# Patient Record
Sex: Female | Born: 1935 | Race: White | Hispanic: No | Smoking: Never smoker
Health system: Southern US, Community
[De-identification: ages and names within clinical notes are randomized; demographics above are authoritative.]

## PROBLEM LIST (undated history)

## (undated) DIAGNOSIS — M1990 Unspecified osteoarthritis, unspecified site: Secondary | ICD-10-CM

## (undated) DIAGNOSIS — K219 Gastro-esophageal reflux disease without esophagitis: Secondary | ICD-10-CM

## (undated) DIAGNOSIS — J00 Acute nasopharyngitis [common cold]: Secondary | ICD-10-CM

## (undated) DIAGNOSIS — J189 Pneumonia, unspecified organism: Secondary | ICD-10-CM

## (undated) DIAGNOSIS — C801 Malignant (primary) neoplasm, unspecified: Secondary | ICD-10-CM

## (undated) DIAGNOSIS — R29818 Other symptoms and signs involving the nervous system: Secondary | ICD-10-CM

## (undated) DIAGNOSIS — R0602 Shortness of breath: Secondary | ICD-10-CM

## (undated) DIAGNOSIS — J471 Bronchiectasis with (acute) exacerbation: Secondary | ICD-10-CM

## (undated) DIAGNOSIS — J449 Chronic obstructive pulmonary disease, unspecified: Secondary | ICD-10-CM

## (undated) DIAGNOSIS — I10 Essential (primary) hypertension: Secondary | ICD-10-CM

## (undated) DIAGNOSIS — R059 Cough, unspecified: Secondary | ICD-10-CM

## (undated) DIAGNOSIS — G4700 Insomnia, unspecified: Secondary | ICD-10-CM

## (undated) DIAGNOSIS — R05 Cough: Secondary | ICD-10-CM

## (undated) DIAGNOSIS — Z923 Personal history of irradiation: Secondary | ICD-10-CM

## (undated) DIAGNOSIS — R42 Dizziness and giddiness: Secondary | ICD-10-CM

## (undated) HISTORY — PX: VARICOSE VEIN SURGERY: SHX832

## (undated) HISTORY — DX: Gastro-esophageal reflux disease without esophagitis: K21.9

## (undated) HISTORY — PX: CATARACT EXTRACTION, BILATERAL: SHX1313

## (undated) HISTORY — PX: ANKLE FRACTURE SURGERY: SHX122

## (undated) HISTORY — DX: Cough: R05

## (undated) HISTORY — DX: Other symptoms and signs involving the nervous system: R29.818

## (undated) HISTORY — DX: Acute nasopharyngitis (common cold): J00

## (undated) HISTORY — PX: COLON SURGERY: SHX602

## (undated) HISTORY — DX: Cough, unspecified: R05.9

## (undated) HISTORY — PX: BASAL CELL CARCINOMA EXCISION: SHX1214

## (undated) HISTORY — DX: Bronchiectasis with (acute) exacerbation: J47.1

## (undated) HISTORY — DX: Insomnia, unspecified: G47.00

## (undated) HISTORY — PX: APPENDECTOMY: SHX54

## (undated) HISTORY — PX: ABDOMINAL HYSTERECTOMY: SHX81

---

## 1997-11-11 ENCOUNTER — Other Ambulatory Visit: Admission: RE | Admit: 1997-11-11 | Discharge: 1997-11-11 | Payer: Self-pay | Admitting: Family Medicine

## 1998-06-26 ENCOUNTER — Other Ambulatory Visit: Admission: RE | Admit: 1998-06-26 | Discharge: 1998-06-26 | Payer: Self-pay | Admitting: Obstetrics and Gynecology

## 1998-10-09 ENCOUNTER — Ambulatory Visit (HOSPITAL_COMMUNITY): Admission: RE | Admit: 1998-10-09 | Discharge: 1998-10-09 | Payer: Self-pay | Admitting: Family Medicine

## 1998-10-09 ENCOUNTER — Encounter: Payer: Self-pay | Admitting: Family Medicine

## 1999-09-25 ENCOUNTER — Encounter: Payer: Self-pay | Admitting: Family Medicine

## 1999-09-25 ENCOUNTER — Ambulatory Visit (HOSPITAL_COMMUNITY): Admission: RE | Admit: 1999-09-25 | Discharge: 1999-09-25 | Payer: Self-pay | Admitting: Family Medicine

## 1999-09-28 ENCOUNTER — Encounter: Payer: Self-pay | Admitting: General Surgery

## 1999-09-28 ENCOUNTER — Inpatient Hospital Stay (HOSPITAL_COMMUNITY): Admission: EM | Admit: 1999-09-28 | Discharge: 1999-10-01 | Payer: Self-pay | Admitting: Emergency Medicine

## 1999-10-17 ENCOUNTER — Encounter: Payer: Self-pay | Admitting: General Surgery

## 1999-10-19 ENCOUNTER — Inpatient Hospital Stay (HOSPITAL_COMMUNITY): Admission: RE | Admit: 1999-10-19 | Discharge: 1999-10-24 | Payer: Self-pay | Admitting: General Surgery

## 1999-10-19 ENCOUNTER — Encounter: Payer: Self-pay | Admitting: General Surgery

## 1999-10-19 ENCOUNTER — Encounter (INDEPENDENT_AMBULATORY_CARE_PROVIDER_SITE_OTHER): Payer: Self-pay | Admitting: *Deleted

## 1999-10-27 ENCOUNTER — Inpatient Hospital Stay (HOSPITAL_COMMUNITY): Admission: EM | Admit: 1999-10-27 | Discharge: 1999-11-01 | Payer: Self-pay | Admitting: Emergency Medicine

## 1999-10-28 ENCOUNTER — Encounter: Payer: Self-pay | Admitting: General Surgery

## 1999-10-29 ENCOUNTER — Encounter: Payer: Self-pay | Admitting: General Surgery

## 1999-10-30 ENCOUNTER — Encounter: Payer: Self-pay | Admitting: Surgery

## 1999-12-12 ENCOUNTER — Ambulatory Visit (HOSPITAL_COMMUNITY): Admission: RE | Admit: 1999-12-12 | Discharge: 1999-12-12 | Payer: Self-pay | Admitting: General Surgery

## 1999-12-12 ENCOUNTER — Encounter: Payer: Self-pay | Admitting: General Surgery

## 2000-02-04 ENCOUNTER — Other Ambulatory Visit: Admission: RE | Admit: 2000-02-04 | Discharge: 2000-02-04 | Payer: Self-pay | Admitting: Obstetrics and Gynecology

## 2000-05-28 ENCOUNTER — Encounter: Payer: Self-pay | Admitting: Otolaryngology

## 2000-05-28 ENCOUNTER — Encounter: Admission: RE | Admit: 2000-05-28 | Discharge: 2000-05-28 | Payer: Self-pay | Admitting: Otolaryngology

## 2001-01-16 ENCOUNTER — Emergency Department (HOSPITAL_COMMUNITY): Admission: EM | Admit: 2001-01-16 | Discharge: 2001-01-16 | Payer: Self-pay | Admitting: Emergency Medicine

## 2001-01-18 ENCOUNTER — Emergency Department (HOSPITAL_COMMUNITY): Admission: EM | Admit: 2001-01-18 | Discharge: 2001-01-18 | Payer: Self-pay | Admitting: Emergency Medicine

## 2002-01-20 ENCOUNTER — Encounter: Payer: Self-pay | Admitting: General Surgery

## 2002-01-20 ENCOUNTER — Ambulatory Visit (HOSPITAL_COMMUNITY): Admission: RE | Admit: 2002-01-20 | Discharge: 2002-01-20 | Payer: Self-pay | Admitting: General Surgery

## 2002-02-18 ENCOUNTER — Encounter: Payer: Self-pay | Admitting: Emergency Medicine

## 2002-02-18 ENCOUNTER — Emergency Department (HOSPITAL_COMMUNITY): Admission: EM | Admit: 2002-02-18 | Discharge: 2002-02-18 | Payer: Self-pay | Admitting: Emergency Medicine

## 2002-05-11 ENCOUNTER — Encounter: Admission: RE | Admit: 2002-05-11 | Discharge: 2002-05-11 | Payer: Self-pay | Admitting: Orthopedic Surgery

## 2002-05-11 ENCOUNTER — Encounter: Payer: Self-pay | Admitting: Orthopedic Surgery

## 2002-09-13 ENCOUNTER — Encounter: Admission: RE | Admit: 2002-09-13 | Discharge: 2002-09-13 | Payer: Self-pay | Admitting: Orthopedic Surgery

## 2002-09-13 ENCOUNTER — Encounter: Payer: Self-pay | Admitting: Orthopedic Surgery

## 2002-09-14 ENCOUNTER — Ambulatory Visit (HOSPITAL_BASED_OUTPATIENT_CLINIC_OR_DEPARTMENT_OTHER): Admission: RE | Admit: 2002-09-14 | Discharge: 2002-09-15 | Payer: Self-pay | Admitting: Orthopedic Surgery

## 2002-11-23 ENCOUNTER — Encounter: Payer: Self-pay | Admitting: General Surgery

## 2002-11-23 ENCOUNTER — Encounter: Admission: RE | Admit: 2002-11-23 | Discharge: 2002-11-23 | Payer: Self-pay | Admitting: General Surgery

## 2003-01-04 ENCOUNTER — Ambulatory Visit (HOSPITAL_BASED_OUTPATIENT_CLINIC_OR_DEPARTMENT_OTHER): Admission: RE | Admit: 2003-01-04 | Discharge: 2003-01-04 | Payer: Self-pay | Admitting: Orthopedic Surgery

## 2003-01-04 ENCOUNTER — Encounter (INDEPENDENT_AMBULATORY_CARE_PROVIDER_SITE_OTHER): Payer: Self-pay | Admitting: *Deleted

## 2003-02-22 ENCOUNTER — Emergency Department (HOSPITAL_COMMUNITY): Admission: EM | Admit: 2003-02-22 | Discharge: 2003-02-23 | Payer: Self-pay | Admitting: Emergency Medicine

## 2003-04-07 ENCOUNTER — Encounter: Admission: RE | Admit: 2003-04-07 | Discharge: 2003-04-07 | Payer: Self-pay | Admitting: Orthopedic Surgery

## 2003-06-28 ENCOUNTER — Encounter
Admission: RE | Admit: 2003-06-28 | Discharge: 2003-06-28 | Payer: Self-pay | Admitting: Physical Medicine and Rehabilitation

## 2003-07-11 ENCOUNTER — Inpatient Hospital Stay (HOSPITAL_COMMUNITY): Admission: EM | Admit: 2003-07-11 | Discharge: 2003-07-16 | Payer: Self-pay | Admitting: Emergency Medicine

## 2004-04-17 ENCOUNTER — Encounter: Admission: RE | Admit: 2004-04-17 | Discharge: 2004-04-17 | Payer: Self-pay | Admitting: Family Medicine

## 2004-05-26 ENCOUNTER — Emergency Department (HOSPITAL_COMMUNITY): Admission: EM | Admit: 2004-05-26 | Discharge: 2004-05-26 | Payer: Self-pay | Admitting: Emergency Medicine

## 2004-11-09 ENCOUNTER — Ambulatory Visit: Payer: Self-pay | Admitting: Internal Medicine

## 2004-12-21 ENCOUNTER — Ambulatory Visit: Payer: Self-pay | Admitting: Internal Medicine

## 2005-01-24 ENCOUNTER — Ambulatory Visit: Payer: Self-pay | Admitting: Internal Medicine

## 2005-05-24 ENCOUNTER — Ambulatory Visit: Payer: Self-pay | Admitting: Internal Medicine

## 2005-07-26 ENCOUNTER — Emergency Department (HOSPITAL_COMMUNITY): Admission: EM | Admit: 2005-07-26 | Discharge: 2005-07-26 | Payer: Self-pay | Admitting: Emergency Medicine

## 2005-09-27 ENCOUNTER — Ambulatory Visit: Payer: Self-pay | Admitting: Internal Medicine

## 2005-11-25 ENCOUNTER — Emergency Department (HOSPITAL_COMMUNITY): Admission: EM | Admit: 2005-11-25 | Discharge: 2005-11-26 | Payer: Self-pay | Admitting: Emergency Medicine

## 2005-11-28 ENCOUNTER — Emergency Department (HOSPITAL_COMMUNITY): Admission: EM | Admit: 2005-11-28 | Discharge: 2005-11-28 | Payer: Self-pay | Admitting: Family Medicine

## 2005-12-02 ENCOUNTER — Ambulatory Visit: Payer: Self-pay | Admitting: Internal Medicine

## 2006-09-12 ENCOUNTER — Ambulatory Visit: Payer: Self-pay | Admitting: Internal Medicine

## 2006-09-21 ENCOUNTER — Emergency Department (HOSPITAL_COMMUNITY): Admission: EM | Admit: 2006-09-21 | Discharge: 2006-09-21 | Payer: Self-pay | Admitting: Family Medicine

## 2006-09-23 ENCOUNTER — Ambulatory Visit: Payer: Self-pay | Admitting: Cardiology

## 2006-11-18 ENCOUNTER — Ambulatory Visit: Payer: Self-pay | Admitting: Internal Medicine

## 2007-03-13 ENCOUNTER — Ambulatory Visit: Payer: Self-pay | Admitting: Internal Medicine

## 2007-03-20 ENCOUNTER — Ambulatory Visit: Payer: Self-pay | Admitting: Internal Medicine

## 2007-04-20 ENCOUNTER — Ambulatory Visit: Payer: Self-pay | Admitting: Internal Medicine

## 2007-04-20 DIAGNOSIS — J00 Acute nasopharyngitis [common cold]: Secondary | ICD-10-CM | POA: Insufficient documentation

## 2007-04-20 DIAGNOSIS — J471 Bronchiectasis with (acute) exacerbation: Secondary | ICD-10-CM | POA: Insufficient documentation

## 2007-04-20 DIAGNOSIS — K219 Gastro-esophageal reflux disease without esophagitis: Secondary | ICD-10-CM | POA: Insufficient documentation

## 2007-05-20 DIAGNOSIS — G4700 Insomnia, unspecified: Secondary | ICD-10-CM

## 2007-05-20 DIAGNOSIS — R05 Cough: Secondary | ICD-10-CM

## 2007-05-21 ENCOUNTER — Ambulatory Visit: Payer: Self-pay | Admitting: Internal Medicine

## 2007-06-01 ENCOUNTER — Encounter: Admission: RE | Admit: 2007-06-01 | Discharge: 2007-06-01 | Payer: Self-pay | Admitting: Obstetrics and Gynecology

## 2007-06-12 ENCOUNTER — Telehealth (INDEPENDENT_AMBULATORY_CARE_PROVIDER_SITE_OTHER): Payer: Self-pay | Admitting: *Deleted

## 2007-09-11 ENCOUNTER — Ambulatory Visit: Payer: Self-pay | Admitting: Internal Medicine

## 2007-09-11 ENCOUNTER — Telehealth: Payer: Self-pay | Admitting: Internal Medicine

## 2007-09-18 ENCOUNTER — Telehealth (INDEPENDENT_AMBULATORY_CARE_PROVIDER_SITE_OTHER): Payer: Self-pay | Admitting: *Deleted

## 2007-09-25 ENCOUNTER — Ambulatory Visit: Payer: Self-pay | Admitting: Cardiovascular Disease

## 2007-09-25 ENCOUNTER — Ambulatory Visit: Payer: Self-pay | Admitting: Internal Medicine

## 2007-09-25 ENCOUNTER — Telehealth: Payer: Self-pay | Admitting: Internal Medicine

## 2007-09-25 DIAGNOSIS — R29818 Other symptoms and signs involving the nervous system: Secondary | ICD-10-CM

## 2007-09-25 LAB — CONVERTED CEMR LAB
ALT: 14 units/L (ref 0–35)
AST: 18 units/L (ref 0–37)
Albumin: 3.8 g/dL (ref 3.5–5.2)
Alkaline Phosphatase: 63 units/L (ref 39–117)
BUN: 19 mg/dL (ref 6–23)
Basophils Absolute: 0 10*3/uL (ref 0.0–0.1)
Basophils Relative: 0 % (ref 0.0–1.0)
Bilirubin, Direct: 0.1 mg/dL (ref 0.0–0.3)
CO2: 32 meq/L (ref 19–32)
Calcium: 9.8 mg/dL (ref 8.4–10.5)
Chloride: 92 meq/L — ABNORMAL LOW (ref 96–112)
Creatinine, Ser: 0.9 mg/dL (ref 0.4–1.2)
Eosinophils Absolute: 0.2 10*3/uL (ref 0.0–0.7)
Eosinophils Relative: 2.3 % (ref 0.0–5.0)
GFR calc Af Amer: 78 mL/min
GFR calc non Af Amer: 64 mL/min
Glucose, Bld: 92 mg/dL (ref 70–99)
HCT: 40.6 % (ref 36.0–46.0)
Hemoglobin: 13.9 g/dL (ref 12.0–15.0)
Lymphocytes Relative: 27.9 % (ref 12.0–46.0)
MCHC: 34.3 g/dL (ref 30.0–36.0)
MCV: 89.5 fL (ref 78.0–100.0)
Monocytes Absolute: 1 10*3/uL (ref 0.1–1.0)
Monocytes Relative: 12 % (ref 3.0–12.0)
Neutro Abs: 5.1 10*3/uL (ref 1.4–7.7)
Neutrophils Relative %: 57.8 % (ref 43.0–77.0)
Platelets: 268 10*3/uL (ref 150–400)
Potassium: 4.2 meq/L (ref 3.5–5.1)
RBC: 4.53 M/uL (ref 3.87–5.11)
RDW: 12.1 % (ref 11.5–14.6)
Sodium: 134 meq/L — ABNORMAL LOW (ref 135–145)
TSH: 4.57 microintl units/mL (ref 0.35–5.50)
Total Bilirubin: 0.8 mg/dL (ref 0.3–1.2)
Total Protein: 7.5 g/dL (ref 6.0–8.3)
WBC: 8.7 10*3/uL (ref 4.5–10.5)

## 2007-11-02 ENCOUNTER — Encounter: Payer: Self-pay | Admitting: Internal Medicine

## 2007-11-27 ENCOUNTER — Encounter: Payer: Self-pay | Admitting: Internal Medicine

## 2008-02-23 ENCOUNTER — Encounter: Payer: Self-pay | Admitting: Internal Medicine

## 2008-08-31 ENCOUNTER — Emergency Department (HOSPITAL_COMMUNITY): Admission: EM | Admit: 2008-08-31 | Discharge: 2008-08-31 | Payer: Self-pay | Admitting: Emergency Medicine

## 2008-08-31 ENCOUNTER — Emergency Department (HOSPITAL_COMMUNITY): Admission: EM | Admit: 2008-08-31 | Discharge: 2008-08-31 | Payer: Self-pay | Admitting: Family Medicine

## 2008-09-09 ENCOUNTER — Ambulatory Visit: Payer: Self-pay | Admitting: Internal Medicine

## 2008-10-03 ENCOUNTER — Encounter: Payer: Self-pay | Admitting: Internal Medicine

## 2008-10-04 ENCOUNTER — Encounter: Admission: RE | Admit: 2008-10-04 | Discharge: 2008-10-04 | Payer: Self-pay | Admitting: Otolaryngology

## 2008-12-16 ENCOUNTER — Telehealth: Payer: Self-pay | Admitting: Internal Medicine

## 2009-04-21 ENCOUNTER — Emergency Department (HOSPITAL_COMMUNITY): Admission: EM | Admit: 2009-04-21 | Discharge: 2009-04-21 | Payer: Self-pay | Admitting: Family Medicine

## 2009-07-24 ENCOUNTER — Ambulatory Visit: Payer: Self-pay | Admitting: Internal Medicine

## 2009-08-25 ENCOUNTER — Encounter: Payer: Self-pay | Admitting: Internal Medicine

## 2009-11-11 ENCOUNTER — Emergency Department (HOSPITAL_COMMUNITY): Admission: EM | Admit: 2009-11-11 | Discharge: 2009-11-11 | Payer: Self-pay | Admitting: Emergency Medicine

## 2010-01-26 ENCOUNTER — Ambulatory Visit: Payer: Self-pay | Admitting: Internal Medicine

## 2010-02-02 ENCOUNTER — Ambulatory Visit: Payer: Self-pay | Admitting: Internal Medicine

## 2010-02-02 DIAGNOSIS — R42 Dizziness and giddiness: Secondary | ICD-10-CM

## 2010-02-06 ENCOUNTER — Ambulatory Visit: Payer: Self-pay | Admitting: Cardiology

## 2010-02-06 ENCOUNTER — Ambulatory Visit: Payer: Self-pay | Admitting: Internal Medicine

## 2010-03-21 ENCOUNTER — Ambulatory Visit: Payer: Self-pay | Admitting: Internal Medicine

## 2010-03-25 DIAGNOSIS — J479 Bronchiectasis, uncomplicated: Secondary | ICD-10-CM

## 2010-05-08 ENCOUNTER — Encounter: Payer: Self-pay | Admitting: Internal Medicine

## 2010-05-15 ENCOUNTER — Ambulatory Visit
Admission: RE | Admit: 2010-05-15 | Discharge: 2010-05-15 | Payer: Self-pay | Source: Home / Self Care | Attending: Internal Medicine | Admitting: Internal Medicine

## 2010-05-24 NOTE — Assessment & Plan Note (Signed)
Summary: rov 1 month ///kp   Primary Provider/Referring Provider:  Catha Gosselin  CC:  1 month follow up visit-not better feels worse(still coughing)..  History of Present Illness: February 02, 2010-   Bronchiectasis, chronic cough, DM NurseCC:1 week follow up visit-back is better but having dizzy spells. Breathing better since last here. Comments that she has felt light headed"dizzy", but not really vertigo and not orthostatic.  Did have to stop car and throw up. Headaches- mostly occipital. hearing not changed. Some fullness in ears.  Back pain is about gone. Cough is much better. She is minimizing cough syrup with none used in last 2 days. Meclizine helped before.   March 21, 2010-  Bronchiectasis, chronic cough, DM Nurse-CC: 1 month follow up visit-review PFT and CT scan with patient. Feels better than last visit. still occasional blurring and dizzy. I suggested she discuss w/ Dr Clarene Duke.  Our treatment last time helped her cough a lot. Some days sputum is white- yellow. Occ hot flash still but no distinct night sweat or fever,  blood or nodes. Feet and legs hurt- slows her more than dyspnea. [place] vascular surgeon in [place] who is talking about surgery.  Says Avelox and Depo inj helped her cough October 7. Uses Proair about twice daily and nebulizer sometimes at night.  PFT- Mild obstructive disease, small airways, w/ response to BD. DLCO 74%.  CT-Some progression of airways disease- ? atypical AFB/ MAIC?  May 15, 2010- Bronchiectasis, chronic cough, DM Nurse-CC: 1 month follow up visit-not better feels worse(still coughing). She thinks she is coughing worse than ever and it makes her light headed. Does note sweats at night.  Sputum cultures- normal flora, negative AFB. Feels weak especially on first rising in the morning. Has been given meclizine for presumptive vertigo. Continues using he mebulizer sporadically, spiriva and occasional Proair.    Preventive Screening-Counseling &  Management  Alcohol-Tobacco     Smoking Status: never  Current Medications (verified): 1)  Nexium 40 Mg  Cpdr (Esomeprazole Magnesium) .... Take 1 Tablet By Mouth Once A Day 2)  Triamterene-Hctz 75-50 Mg  Tabs (Triamterene-Hctz) .... Take 1 Tablet By Mouth Once A Day 3)  Premarin 0.625 Mg  Tabs (Estrogens Conjugated) .... Take 1 Tablet By Mouth Once A Day 4)  Albuterol Sulfate (2.5 Mg/33ml) 0.083%  Nebu (Albuterol Sulfate) .... Use As Directed 5)  Spiriva Handihaler 18 Mcg Caps (Tiotropium Bromide Monohydrate) .... Inhale Contents of 1 Capsule Once A Day 6)  Home Nebulizer With Ipratropium 0.02% 7)  Proair Hfa 108 (90 Base) Mcg/act Aers (Albuterol Sulfate) .... Inhale 2 Puffs Every 4-6 Hours As Needed For Shortness of Breath 8)  Metformin Hcl 500 Mg Tabs (Metformin Hcl) .... Take 1 By Mouth Two Times A Day 9)  Welchol 625 Mg Tabs (Colesevelam Hcl) .... 2 Tablets Two Times A Day 10)  Singulair 10 Mg Tabs (Montelukast Sodium) .Marland Kitchen.. 1 By Mouth Daily 11)  Ipratropium Bromide 0.02 % Soln (Ipratropium Bromide) .... Use 1 Vial in [employer] Four Times Daily 12)  Xopenex 1.25 Mg/59ml Nebu (Levalbuterol Hcl) .Marland Kitchen.. 1nebulizer  Three Times A Day As Needed 13)  Meclizine Hcl 25 Mg Tabs (Meclizine Hcl) .... 1/2 -1 Three Times A Day As Needed Dizzines 14)  Alprazolam 0.25 Mg Tabs (Alprazolam) .Marland Kitchen.. 1 Three Times A Day As Needed  Allergies (verified): 1)  ! Celebrex 2)  ! * Glucosamine  Past History:  Past Medical History: Last updated: 03/21/2010 DM (ICD-250.00) MUSCULOSKELETAL PAIN (ICD-781.99) INSOMNIA (ICD-780.52) COUGH, CHRONIC (  ICD-786.2) BRONCHIECTASIS WITH ACUTE EXACERBATION (ICD-494.1) - PFT 02/06/10- FEV1 1.60/ 85%, 25-75% imprvd 114% w/ bronchodilator. DLCO mildly reduced at 74%. ACUTE NASOPHARYNGITIS (ICD-460) ESOPHAGEAL REFLUX (ICD-530.81)    Past Surgical History: Last updated: 05/21/2007 Podiatric surgery  Family History: Last updated: 07-Nov-2007 Mother- deceased age 75; old  age. Father- deceased age 55; chronic bronchitis Sister- deceased age 34; DM, Heart trouble Brother-living age 47; heart trouble  Social History: Last updated: 01/26/2010 Patient never smoked.  Retired  Widowed with 2 children  Risk Factors: Smoking Status: never (05/15/2010)  Review of Systems      See HPI       The patient complains of shortness of breath with activity, productive cough, and non-productive cough.  The patient denies shortness of breath at rest, coughing up blood, chest pain, irregular heartbeats, acid heartburn, indigestion, loss of appetite, weight change, abdominal pain, difficulty swallowing, sore throat, tooth/dental problems, headaches, nasal congestion/difficulty breathing through nose, sneezing, itching, hand/feet swelling, rash, change in color of mucus, and fever.    Vital Signs:  Patient profile:   77 year old female Height:      69 inches Weight:      160.25 pounds BMI:     23.75 O2 Sat:      95 % on Room air Pulse rate:   79 / minute BP sitting:   122 / 70  (left arm) Cuff size:   regular  Vitals Entered By: Reynaldo Minium CMA (May 15, 2010 3:20 PM)  O2 Flow:  Room air CC: 1 month follow up visit-not better feels worse(still coughing).   Physical Exam  Additional Exam:  General: A/Ox3; pleasant and cooperative,  neatly groomed, stressed and tearful SKIN: no rash, lesions NODES: no lymphadenopathy HEENT: [place]/AT, EOM- WNL, Conjuctivae- clear, PERRLA, TM-WNL, Nose- clear, Throat- clear and wnl, not red, tongue is not coated NECK: Supple w/ fair ROM, JVD- none, normal carotid impulses w/o bruits Thyroid- normal to palpation CHEST: Vibratory cough. I hear very little crackle HEART: RRR, no m/g/r heard ABDOMEN: not obese EAV:WUJW, nl pulses, no edema  NEURO: Grossly intact to observation      Impression & Recommendations:  Problem # 1:  BRONCHIECTASIS (ICD-494.0) We discused the possibility based on CT, that she may have MAIC i  discussed bronchoscopy for deeper sample. At her age she is not eager.We agreed to let her try a course of Cipro to see if that would affect her cough. The sound quality suggests bronchomalacia.  She is concerned about cost of Spiriva. She has neb ipratropium at home and decided she would try regular use of that for a while for comparison.   Problem # 2:  ESOPHAGEAL REFLUX (ICD-530.81) Known hx reflux, but ai can't get any description from her now that suggests ongoing reflux or aspiration. Her updated medication list for this problem includes:    Nexium 40 Mg Cpdr (Esomeprazole magnesium) .Marland Kitchen... Take 1 tablet by mouth once a day  Medications Added to Medication List This Visit: 1)  Ciprofloxacin Hcl 250 Mg Tabs (Ciprofloxacin hcl) .Marland Kitchen.. 1 twice daily x 10 days  Other Orders: Est. Patient Level III (11914)  Patient Instructions: 1)  Please schedule a follow-up appointment in 1 month. 2)  Script for Cipro sent to your drug store.  Prescriptions: CIPROFLOXACIN HCL 250 MG TABS (CIPROFLOXACIN HCL) 1 twice daily x 10 days  #20 x 0   Entered and Authorized by:   Waymon Budge MD   Signed by:   Waymon Budge  MD on 05/15/2010   Method used:   Electronically to        [facility]  [place] [address] (retail)       [address]       [place], [place]  [address]       Ph: [phone] or [phone]       Fax: [phone]   RxID:   [id number]
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
## 2010-05-24 NOTE — Assessment & Plan Note (Signed)
Summary: coughing/mbw   Primary Provider/Referring Provider:  Catha Gosselin  CC:  Accute visit-cough, wheezing, pain in chest, upper back, and and under breasts..  History of Present Illness:  09/09/08- Bronchieictasis, chronic cough, DM Stressed- husband nearly blind. C/O no sense of taste, sore tip of tongue. Some nasal congestion, light yellow mucus from chest. Doesn't remember bad cold at onset. Ears feel ok. Went to Urgent Care for weakness and was given "two pills for metabolism".  July 24, 2009- Bronchiectasis, chronic cough, DM Cough worse over past 3 months, mostly dry but somtimes scant but not purulent. Tussive left rib pains. Having to work harder tending for chronically ill husband, maintain the household etc. Says home neb albuterol may have been replaced by xopenex but doesn't seem to help much. Denies fever, chills, blood, nodes. Cough is more of a problem than dyspnea. She tries not to use cough syrup. Perles no help. Has lost some weight- 20 lbs over 2 years. She blames stress.  January 26, 2010  Bronchiectasis, chronic cough, DM Acute visit-cough,wheezing, pain in chest,upper back, and under breasts. Increased cough for over a  month or more. In the last day has had pain mid back around both sides. Hurts to move or twist. Headache- occipital and frontal. Nothing from nose or chest. Has lost 2 more pounds since Spring after losing a lot last year with husband's illness. He recently died and she was burning family papers yesterday with smoke exposure. She is embarrased by her chronic cough.   Preventive Screening-Counseling & Management  Alcohol-Tobacco     Smoking Status: never  Current Medications (verified): 1)  Nexium 40 Mg  Cpdr (Esomeprazole Magnesium) .... Take 1 Tablet By Mouth Once A Day 2)  Triamterene-Hctz 75-50 Mg  Tabs (Triamterene-Hctz) .... Take 1 Tablet By Mouth Once A Day 3)  Premarin 0.625 Mg  Tabs (Estrogens Conjugated) .... Take 1 Tablet By Mouth  Once A Day 4)  Albuterol Sulfate (2.5 Mg/22ml) 0.083%  Nebu (Albuterol Sulfate) .... Use As Directed 5)  Spiriva Handihaler 18 Mcg Caps (Tiotropium Bromide Monohydrate) .... Inhale Contents of 1 Capsule Once A Day 6)  Home Nebulizer With Ipratropium 0.02% 7)  Proair Hfa 108 (90 Base) Mcg/act Aers (Albuterol Sulfate) .... Inhale 2 Puffs Every 4-6 Hours As Needed For Shortness of Breath 8)  Alprazolam 0.25 Mg  Tabs (Alprazolam) .Marland Kitchen.. 1 Three Times A Day As Needed 9)  Metformin Hcl 500 Mg Tabs (Metformin Hcl) .... Take 1 By Mouth Two Times A Day 10)  Welchol 625 Mg Tabs (Colesevelam Hcl) .... 3 Tablets Two Times A Day 11)  Augmentin 875-125 Mg Tabs (Amoxicillin-Pot Clavulanate) .Marland Kitchen.. 1 Two Times A Day For Infection 12)  Singulair 10 Mg Tabs (Montelukast Sodium) .Marland Kitchen.. 1 By Mouth Daily 13)  Ipratropium Bromide 0.02 % Soln (Ipratropium Bromide) .... Use 1 Vial in [employer] Four Times Daily  Allergies (verified): 1)  ! Celebrex 2)  ! * Glucosamine  Past History:  Past Medical History: Last updated: 07/24/2009 DM (ICD-250.00) MUSCULOSKELETAL PAIN (ICD-781.99) INSOMNIA (ICD-780.52) COUGH, CHRONIC (ICD-786.2) BRONCHIECTASIS WITH ACUTE EXACERBATION (ICD-494.1) ACUTE NASOPHARYNGITIS (ICD-460) ESOPHAGEAL REFLUX (ICD-530.81)    Past Surgical History: Last updated: 05/21/2007 Podiatric surgery  Family History: Last updated: 10/24/07 Mother- deceased age 84; old age. Father- deceased age 69; chronic bronchitis Sister- deceased age 19; DM, Heart trouble Brother-living age 38; heart trouble  Social History: Last updated: 01/26/2010 Patient never smoked.  Retired  Widowed with 2 children  Risk Factors: Smoking Status: never (  01/26/2010)  Social History: Patient never smoked.  Retired  Widowed with 2 children  Review of Systems      See HPI       The patient complains of shortness of breath with activity, shortness of breath at rest, non-productive cough, and chest pain.  The patient  denies productive cough, coughing up blood, irregular heartbeats, acid heartburn, indigestion, loss of appetite, weight change, abdominal pain, difficulty swallowing, sore throat, tooth/dental problems, headaches, nasal congestion/difficulty breathing through nose, and sneezing.    Vital Signs:  Patient profile:   77 year old female Height:      69 inches Weight:      159.38 pounds BMI:     23.62 O2 Sat:      97 % on Room air Pulse rate:   95 / minute BP sitting:   118 / 82  (left arm) Cuff size:   regular  Vitals Entered By: Reynaldo Minium CMA (January 26, 2010 2:00 PM)  O2 Flow:  Room air CC: Accute visit-cough,wheezing,pain in chest,upper back, and under breasts.   Physical Exam  Additional Exam:  General: A/Ox3; pleasant and cooperative, , neatly groomed, stressed and tearful SKIN: no rash, lesions NODES: no lymphadenopathy HEENT: [place]/AT, EOM- WNL, Conjuctivae- clear, PERRLA, TM-WNL, Nose- clear, Throat- clear and wnl, tongue is not coated NECK: Supple w/ fair ROM, JVD- none, normal carotid impulses w/o bruits Thyroid- normal to palpation CHEST: Shallow, guarded respirations. Harsh brassy cough. HEART: RRR, no m/g/r heard ABDOMEN: not obese EAV:WUJW, nl pulses, no edema  NEURO: Grossly intact to observation      Impression & Recommendations:  Problem # 1:  BRONCHIECTASIS WITH ACUTE EXACERBATION (ICD-494.1) Exacerbation of her chronic cough. She uses Spririva. albuterol makes her shakey. Can tolerate advil or Excedrin.  Will give neb and depo, cough syrup and have her use Advil or Excedrin and heat. Start antibiotic.  We will get her back in a week to f/u and to focus then on longer term.   Problem # 2:  MUSCULOSKELETAL PAIN (ICD-781.99) CXR doesn't show acute process. I expected to find vertebral compression fracture, but instead will trreat as pulled muscle.   Medications Added to Medication List This Visit: 1)  Xopenex 1.25 Mg/46ml Nebu (Levalbuterol hcl) .Marland Kitchen..  1nebulizer  three times a day as needed 2)  Avelox 400 Mg Tabs (Moxifloxacin hcl) .Marland Kitchen.. 1 daily 3)  Hydrocodone-homatropine 5-1.5 Mg/63ml Syrp (Hydrocodone-homatropine) .... 1/2-1 teaspoon four times a day as needed cough  Other Orders: T-2 View CXR (71020TC) Est. Patient Level IV (11914)  Patient Instructions: 1)  Please schedule a follow-up appointment in 1 week. 2)  Scripts for  3)  antibiotic- Avelox 4)  cough syrup- Hydrocodone 5)  Nebulizer solution- try Xopenex instead of,  or mixed with your       ipratropium 6)  for pain med- take Advil or Excedrin and use heating pad 7)  Neb xop 1.25 8)  depo 80 Prescriptions: HYDROCODONE-HOMATROPINE 5-1.5 MG/5ML SYRP (HYDROCODONE-HOMATROPINE) 1/2-1 teaspoon four times a day as needed cough  #200 ml x 0   Entered and Authorized by:   Waymon Budge MD   Signed by:   Waymon Budge MD on 01/26/2010   Method used:   Print then Give to Patient   RxID:   [id number] AVELOX 400 MG TABS (MOXIFLOXACIN HCL) 1 daily  #7 x 0   Entered and Authorized by:   Waymon Budge MD   Signed by:   Waymon Budge  MD on 01/26/2010   Method used:   Print then Give to Patient   RxID:   [id number] XOPENEX 1.25 MG/3ML NEBU (LEVALBUTEROL HCL) 1nebulizer  three times a day as needed  #25 x prn   Entered and Authorized by:   Waymon Budge MD   Signed by:   Waymon Budge MD on 01/26/2010   Method used:   Print then Give to Patient   RxID:   [id number]     Appended Document: coughing/mbw-depo and neb tx charges    Clinical Lists Changes  Orders: Added new Service order of Admin of Therapeutic Inj  intramuscular or subcutaneous (32440) - Signed Added new Service order of Depo- Medrol 80mg  (J1040) - Signed Added new Service order of Nebulizer Tx (10272) - Signed       Medication Administration  Injection # 1:    Medication: Depo- Medrol 80mg     Diagnosis: BRONCHIECTASIS WITH ACUTE EXACERBATION (ICD-494.1)    Route: SQ     Site: RUOQ gluteus    Exp Date: 07/2012    Lot #: [id number]    Mfr: Pharmacia    Patient tolerated injection without complications    Given by: Vivianne Spence   Medication # 1:    Medication: Xopenex 1.25mg     Diagnosis: BRONCHIECTASIS WITH ACUTE EXACERBATION (ICD-494.1)    Dose: 1 vial    Route: inhaled    Exp Date: 11/2010    Lot #: [id number]    Mfr: Sepracor    Patient tolerated medication without complications    Given by: Vivianne Spence  Orders Added: 1)  Admin of Therapeutic Inj  intramuscular or subcutaneous [96372] 2)  Depo- Medrol 80mg  [J1040] 3)  Nebulizer Tx [34742]

## 2010-05-24 NOTE — Medication Information (Signed)
Summary: Nebulizer/[facility]  Nebulizer/[facility]   Imported By: Sherian Rein 09/06/2009 13:35:38  _____________________________________________________________________  External Attachment:    Type:   Image     Comment:   External Document

## 2010-05-24 NOTE — Miscellaneous (Signed)
Summary: Orders Update pft charges  Clinical Lists Changes  Orders: Added new Service order of Carbon Monoxide diffusing w/capacity (94720) - Signed Added new Service order of Lung Volumes (94240) - Signed Added new Service order of Spirometry (Pre & Post) (94060) - Signed 

## 2010-05-24 NOTE — Assessment & Plan Note (Signed)
Summary: cough/ congestion///kp   Primary Provider/Referring Provider:  Catha Gosselin  CC:  Accute visit-cough-clear x 2 months. Congestion in chest; pain on right side of chest and ab.Marland Kitchen  History of Present Illness: 09/25/07-Bronchiectasis, chronic cough, DM 77 year old woman returning for follow-up of bronchiectasis with chronic cough.  She says she feels weak.  Sinusitis is better, no longer green discharge.  Complains of pain to the lower left lateral chest wall or upper abdomen along the costal margin.  That is not associated with deep breaths or twisting.  Chronic cough is better today than sometimes.  She complains of weakness and lack of strength with no sudden event.  She denies fever, adenopathy, bleeding, palpitation, exertional chest pain, GI upset, nausea or vomiting.  She says she is eating well.  She had gotten a cortisone injection May 22.  09/09/08- Bronchieictasis, chronic cough, DM Stressed- husband nearly blind. C/O no sense of taste, sore tip of tongue. Some nasal congestion, light yellow mucus from chest. Doesn't remember bad cold at onset. Ears feel ok. Went to Urgent Care for weakness and was given "two pills for metabolism".  July 24, 2009- Bronchiectasis, chronic cough, DM Cough worse over past 3 months, mostly dry but somtimes scant but not purulent. Tussive left rib pains. Having to work harder tending for chronically ill husband, maintain the household etc. Says home neb albuterol may have been replaced by xopenex but doesn't seem to help much. Denies fever, chills, blood, nodes. Cough is more of a problem than dyspnea. She tries not to use cough syrup. Perles no help. Has lost some weight- 20 lbs over 2 years. She blames stress.   Current Medications (verified): 1)  Nexium 40 Mg  Cpdr (Esomeprazole Magnesium) .... Take 1 Tablet By Mouth Once A Day 2)  Triamterene-Hctz 75-50 Mg  Tabs (Triamterene-Hctz) .... Take 1 Tablet By Mouth Once A Day 3)  Premarin 0.625 Mg   Tabs (Estrogens Conjugated) .... Take 1 Tablet By Mouth Once A Day 4)  Albuterol Sulfate (2.5 Mg/47ml) 0.083%  Nebu (Albuterol Sulfate) .... Use As Directed 5)  Spiriva Handihaler 18 Mcg Caps (Tiotropium Bromide Monohydrate) .... Inhale Contents of 1 Capsule Once A Day 6)  Home Nebulizer With Ipratropium 0.02% 7)  Proair Hfa 108 (90 Base) Mcg/act Aers (Albuterol Sulfate) .... Inhale 2 Puffs Every 4-6 Hours As Needed For Shortness of Breath 8)  Alprazolam 0.25 Mg  Tabs (Alprazolam) .Marland Kitchen.. 1 Three Times A Day As Needed 9)  Metformin Hcl 500 Mg Tabs (Metformin Hcl) .... Take 1 By Mouth Two Times A Day 10)  Welchol 625 Mg Tabs (Colesevelam Hcl) .... 3 Tablets Two Times A Day  Allergies (verified): 1)  ! Celebrex 2)  ! * Glucosamine  Past History:  Past Surgical History: Last updated: 05/21/2007 Podiatric surgery  Family History: Last updated: Oct 23, 2007 Mother- deceased age 53; old age. Father- deceased age 60; chronic bronchitis Sister- deceased age 46; DM, Heart trouble Brother-living age 72; heart trouble  Social History: Last updated: 23-Oct-2007 Patient never smoked.  Retired  Married with 2 children  Risk Factors: Smoking Status: never (04/20/2007)  Past Medical History: DM (ICD-250.00) MUSCULOSKELETAL PAIN (ICD-781.99) INSOMNIA (ICD-780.52) COUGH, CHRONIC (ICD-786.2) BRONCHIECTASIS WITH ACUTE EXACERBATION (ICD-494.1) ACUTE NASOPHARYNGITIS (ICD-460) ESOPHAGEAL REFLUX (ICD-530.81)    Review of Systems      See HPI       The patient complains of prolonged cough.  The patient denies anorexia, fever, weight loss, weight gain, vision loss, decreased hearing, hoarseness, chest pain,  syncope, dyspnea on exertion, peripheral edema, headaches, hemoptysis, and abdominal pain.    Vital Signs:  Patient profile:   77 year old female Height:      69 inches Weight:      161.50 pounds BMI:     23.94 O2 Sat:      96 % on Room air Pulse rate:   93 / minute BP sitting:   130 / 74   (left arm) Cuff size:   regular  Vitals Entered By: Reynaldo Minium CMA (July 24, 2009 10:47 AM)  O2 Flow:  Room air  Physical Exam  Additional Exam:  General: A/Ox3; pleasant and cooperative, NAD, neatly groomed SKIN: no rash, lesions NODES: no lymphadenopathy HEENT: [place]/AT, EOM- WNL, Conjuctivae- clear, PERRLA, TM-WNL, Nose- clear, Throat- clear and wnl, tongue is not coated NECK: Supple w/ fair ROM, JVD- none, normal carotid impulses w/o bruits Thyroid- normal to palpation CHEST: Scattered rhonchi, few rhonchi left lateral chest. Harsh cough HEART: RRR, no m/g/r heard ABDOMEN:  QMV:HQIO, nl pulses, no edema  NEURO: Grossly intact to observation      Impression & Recommendations:  Problem # 1:  BRONCHIECTASIS WITH ACUTE EXACERBATION (ICD-494.1) Weight loss is probably from stress, but we discussed concerning symptoms that she should watch for and report. Her bronchiectasis is more active. She never smoked, but husband has emphysema so her second hand smoke exposure was likely significant. We will get CXR, give antibiotic, and have her try combining her neb xopenex with ipratropium.  Problem # 2:  MUSCULOSKELETAL PAIN (ICD-781.99) This should improve as the cough eases.  Medications Added to Medication List This Visit: 1)  Metformin Hcl 500 Mg Tabs (Metformin hcl) .... Take 1 by mouth two times a day 2)  Welchol 625 Mg Tabs (Colesevelam hcl) .... 3 tablets two times a day 3)  Augmentin 875-125 Mg Tabs (Amoxicillin-pot clavulanate) .Marland Kitchen.. 1 two times a day for infection  Other Orders: Est. Patient Level III (96295) T-2 View CXR (71020TC) Pneumococcal Vaccine (28413) Admin 1st Vaccine (24401)  Patient Instructions: 1)  Please schedule a follow-up appointment in 1 year. 2)  Script for augmentin antibiotic sent to your drug store 3)  A chest x-ray has been recommended.  Your imaging study may require preauthorization.  4)  Try combining nebulizer solutions: ipratropium with  Xopenex 5)  Pneumovax Prescriptions: AUGMENTIN 875-125 MG TABS (AMOXICILLIN-POT CLAVULANATE) 1 two times a day for infection  #14 x 0   Entered and Authorized by:   Waymon Budge MD   Signed by:   Waymon Budge MD on 07/24/2009   Method used:   [facility] to        [facility]  [place] [address] [facility]retail[facility]       [address] [place] [address]       [place], [place]  [address]       Ph: [phone] or [phone]       Fax: [phone]   RxID:   [id number]    Immunizations Administered:  Pneumonia Vaccine:    Vaccine Type: Pneumovax    Site: left deltoid    Mfr: Merck    Dose: 0.5 ml    Route: IM    Given by: Reynaldo Minium CMA    Exp. Date: 12/10/2010    Lot #: [id number]    VIS given: 11/18/95 version given July 24, 2009.

## 2010-05-24 NOTE — Assessment & Plan Note (Signed)
Summary: rov - ok per Florentina Addison ///kp   Primary Provider/Referring Provider:  Catha Gosselin  CC:  1 week follow up visit-back is better but having dizzy spells..  History of Present Illness: July 24, 2009- Bronchiectasis, chronic cough, DM Cough worse over past 3 months, mostly dry but somtimes scant but not purulent. Tussive left rib pains. Having to work harder tending for chronically ill husband, maintain the household etc. Says home neb albuterol may have been replaced by xopenex but doesn't seem to help much. Denies fever, chills, blood, nodes. Cough is more of a problem than dyspnea. She tries not to use cough syrup. Perles no help. Has lost some weight- 20 lbs over 2 years. She blames stress.  January 26, 2010  Bronchiectasis, chronic cough, DM Acute visit-cough,wheezing, pain in chest,upper back, and under breasts. Increased cough for over a  month or more. In the last day has had pain mid back around both sides. Hurts to move or twist. Headache- occipital and frontal. Nothing from nose or chest. Has lost 2 more pounds since Spring after losing a lot last year with husband's illness. He recently died and she was burning family papers yesterday with smoke exposure. She is embarrased by her chronic cough.  February 02, 2010-   Bronchiectasis, chronic cough, DM NurseCC:1 week follow up visit-back is better but having dizzy spells. Breathing better since last here. Comments that she has felt light headed"dizzy", but not really vertigo and not orthostatic.  Did have to stop car and throw up. Headaches- mostly occipital. hearing not changed. Some fullness in ears.  Back pain is about gone. Cough is much better. She is minimizing cough syrup with none used in last 2 days. Meclizine helped before.    Preventive Screening-Counseling & Management  Alcohol-Tobacco     Smoking Status: never  Current Medications (verified): 1)  Nexium 40 Mg  Cpdr (Esomeprazole Magnesium) .... Take 1 Tablet By  Mouth Once A Day 2)  Triamterene-Hctz 75-50 Mg  Tabs (Triamterene-Hctz) .... Take 1 Tablet By Mouth Once A Day 3)  Premarin 0.625 Mg  Tabs (Estrogens Conjugated) .... Take 1 Tablet By Mouth Once A Day 4)  Albuterol Sulfate (2.5 Mg/45ml) 0.083%  Nebu (Albuterol Sulfate) .... Use As Directed 5)  Spiriva Handihaler 18 Mcg Caps (Tiotropium Bromide Monohydrate) .... Inhale Contents of 1 Capsule Once A Day 6)  Home Nebulizer With Ipratropium 0.02% 7)  Proair Hfa 108 (90 Base) Mcg/act Aers (Albuterol Sulfate) .... Inhale 2 Puffs Every 4-6 Hours As Needed For Shortness of Breath 8)  Alprazolam 0.25 Mg  Tabs (Alprazolam) .Marland Kitchen.. 1 Three Times A Day As Needed 9)  Metformin Hcl 500 Mg Tabs (Metformin Hcl) .... Take 1 By Mouth Two Times A Day 10)  Welchol 625 Mg Tabs (Colesevelam Hcl) .... 3 Tablets Two Times A Day 11)  Singulair 10 Mg Tabs (Montelukast Sodium) .Marland Kitchen.. 1 By Mouth Daily 12)  Ipratropium Bromide 0.02 % Soln (Ipratropium Bromide) .... Use 1 Vial in [employer] Four Times Daily 13)  Xopenex 1.25 Mg/42ml Nebu (Levalbuterol Hcl) .Marland Kitchen.. 1nebulizer  Three Times A Day As Needed 14)  Hydrocodone-Homatropine 5-1.5 Mg/43ml Syrp (Hydrocodone-Homatropine) .... 1/2-1 Teaspoon Four Times A Day As Needed Cough  Allergies (verified): 1)  ! Celebrex 2)  ! * Glucosamine  Past History:  Past Medical History: Last updated: 07/24/2009 DM (ICD-250.00) MUSCULOSKELETAL PAIN (ICD-781.99) INSOMNIA (ICD-780.52) COUGH, CHRONIC (ICD-786.2) BRONCHIECTASIS WITH ACUTE EXACERBATION (ICD-494.1) ACUTE NASOPHARYNGITIS (ICD-460) ESOPHAGEAL REFLUX (ICD-530.81)    Past Surgical History:  Last updated: 05/21/2007 Podiatric surgery  Family History: Last updated: 10-21-2007 Mother- deceased age 29; old age. Father- deceased age 41; chronic bronchitis Sister- deceased age 5; DM, Heart trouble Brother-living age 34; heart trouble  Social History: Last updated: 01/26/2010 Patient never smoked.  Retired  Widowed with 2  children  Risk Factors: Smoking Status: never (02/02/2010)  Review of Systems      See HPI       The patient complains of non-productive cough and headaches.  The patient denies shortness of breath with activity, shortness of breath at rest, productive cough, coughing up blood, chest pain, irregular heartbeats, acid heartburn, indigestion, loss of appetite, abdominal pain, difficulty swallowing, sore throat, tooth/dental problems, nasal congestion/difficulty breathing through nose, sneezing, itching, hand/feet swelling, rash, change in color of mucus, and fever.    Vital Signs:  Patient profile:   77 year old female Height:      69 inches Weight:      159.25 pounds BMI:     23.60 O2 Sat:      95 % on Room air Pulse rate:   81 / minute BP sitting:   128 / 84  (right arm) Cuff size:   regular  Vitals Entered By: Reynaldo Minium CMA (February 02, 2010 5:07 PM)  O2 Flow:  Room air CC: 1 week follow up visit-back is better but having dizzy spells.   Physical Exam  Additional Exam:  General: A/Ox3; pleasant and cooperative,  neatly groomed, stressed and tearful SKIN: no rash, lesions NODES: no lymphadenopathy HEENT: [place]/AT, EOM- WNL, Conjuctivae- clear, PERRLA, TM-WNL, Nose- clear, Throat- clear and wnl, tongue is not coated NECK: Supple w/ fair ROM, JVD- none, normal carotid impulses w/o bruits Thyroid- normal to palpation CHEST: Shallow, guarded respirations. Not coughing. She seems much more comfortable today. HEART: RRR, no m/g/r heard ABDOMEN: not obese EAV:WUJW, nl pulses, no edema  NEURO: Grossly intact to observation      Impression & Recommendations:  Problem # 1:  BRONCHIECTASIS WITH ACUTE EXACERBATION (ICD-494.1) Much improved acutely. We will watch. Ordering PFT and CT  as agreed last visit, to update our understaqnding of her status with this  Problem # 2:  DIZZINESS (ICD-780.4)  Nonspecific. Will give meclizine for now. DDX vertebrobasilar insufficiency, BPV,  inner ear disease.   Medications Added to Medication List This Visit: 1)  Meclizine Hcl 25 Mg Tabs (Meclizine hcl) .... 1/2 -1 three times a day as needed dizzines  Other Orders: Est. Patient Level III (11914) Misc. Referral (Misc. Ref) Radiology Referral (Radiology) Flu Vaccine 87yrs + MEDICARE PATIENTS [phone]) Administration Flu vaccine - MCR (A2130)  Patient Instructions: 1)  Please schedule a follow-up appointment in 1 month.  2)  Flu vax 3)  PCC will work with you to schedue PFT 4)  A Chest CT WITHOUT Contrast has been recommended. Your imaging study may require preauthorization.  5)  Script for meclizine sent to your drug store Prescriptions: MECLIZINE HCL 25 MG TABS (MECLIZINE HCL) 1/2 -1 three times a day as needed dizzines  #25 x prn   Entered and Authorized by:   Waymon Budge MD   Signed by:   Waymon Budge MD on 02/02/2010   Method used:   [facility]        [facility]  [place] [address]       [address] [place] [address]       [place], [place]  [address]       Ph: [phone]  or [phone]       Fax: [phone]   RxID:   [id number]   Flu Vaccine Consent Questions     Do you have a history of severe allergic reactions to this vaccine? no    Any prior history of allergic reactions to egg and/or gelatin? no    Do you have a sensitivity to the preservative Thimersol? no    Do you have a past history of Guillan-Barre Syndrome? no    Do you currently have an acute febrile illness? no    Have you ever had a severe reaction to latex? no    Vaccine information given and explained to patient? yes    Are you currently pregnant? no    Lot Number:[id number]   Exp Date:10/20/2010   Site Given  Left Deltoid IMent-CCC]     .lbmedflu  Reynaldo Minium CMA  February 02, 2010 6:03 PM

## 2010-06-14 ENCOUNTER — Ambulatory Visit: Payer: Self-pay | Admitting: Internal Medicine

## 2010-07-06 ENCOUNTER — Ambulatory Visit: Payer: Self-pay | Admitting: Internal Medicine

## 2010-07-07 LAB — DIFFERENTIAL
Basophils Absolute: 0 10*3/uL (ref 0.0–0.1)
Basophils Relative: 1 % (ref 0–1)
Eosinophils Absolute: 0.1 10*3/uL (ref 0.0–0.7)
Eosinophils Relative: 2 % (ref 0–5)
Lymphocytes Relative: 16 % (ref 12–46)
Lymphs Abs: 1.1 10*3/uL (ref 0.7–4.0)
Monocytes Absolute: 0.9 10*3/uL (ref 0.1–1.0)
Monocytes Relative: 12 % (ref 3–12)
Neutro Abs: 5.1 10*3/uL (ref 1.7–7.7)
Neutrophils Relative %: 70 % (ref 43–77)

## 2010-07-07 LAB — CBC
HCT: 35.9 % — ABNORMAL LOW (ref 36.0–46.0)
Hemoglobin: 12.3 g/dL (ref 12.0–15.0)
MCH: 30.7 pg (ref 26.0–34.0)
MCHC: 34.4 g/dL (ref 30.0–36.0)
MCV: 89.4 fL (ref 78.0–100.0)
Platelets: 247 10*3/uL (ref 150–400)
RBC: 4.01 MIL/uL (ref 3.87–5.11)
RDW: 13.6 % (ref 11.5–15.5)
WBC: 7.3 10*3/uL (ref 4.0–10.5)

## 2010-07-07 LAB — COMPREHENSIVE METABOLIC PANEL
ALT: 14 U/L (ref 0–35)
AST: 21 U/L (ref 0–37)
Albumin: 3.5 g/dL (ref 3.5–5.2)
Alkaline Phosphatase: 57 U/L (ref 39–117)
BUN: 10 mg/dL (ref 6–23)
CO2: 27 mEq/L (ref 19–32)
Calcium: 9 mg/dL (ref 8.4–10.5)
Chloride: 94 mEq/L — ABNORMAL LOW (ref 96–112)
Creatinine, Ser: 0.77 mg/dL (ref 0.4–1.2)
GFR calc Af Amer: >60 mL/min (ref >60)
GFR calc non Af Amer: >60 mL/min (ref >60)
Glucose, Bld: 102 mg/dL — ABNORMAL HIGH (ref 70–99)
Potassium: 3.5 mEq/L (ref 3.5–5.1)
Sodium: 131 mEq/L — ABNORMAL LOW (ref 135–145)
Total Bilirubin: 0.6 mg/dL (ref 0.3–1.2)
Total Protein: 6.7 g/dL (ref 6.0–8.3)

## 2010-07-07 LAB — POCT CARDIAC MARKERS
CKMB, poc: 1.5 ng/mL (ref 1.0–8.0)
Myoglobin, poc: 79.8 ng/mL (ref 12–200)
Troponin i, poc: <0.05 ng/mL (ref 0.00–0.09)

## 2010-07-07 LAB — D-DIMER, QUANTITATIVE: D-Dimer, Quant: <0.22 ug/mL-FEU (ref 0.00–0.48)

## 2010-07-23 LAB — POCT URINALYSIS DIP (DEVICE)
Glucose, UA: 100 mg/dL — AB
Hgb urine dipstick: NEGATIVE
Ketones, ur: 15 mg/dL — AB
Nitrite: POSITIVE — AB
Protein, ur: 30 mg/dL — AB
Specific Gravity, Urine: <=1.005 (ref 1.005–1.030)
Urobilinogen, UA: 2 mg/dL — ABNORMAL HIGH (ref 0.0–1.0)
pH: 6 (ref 5.0–8.0)

## 2010-07-23 LAB — URINE CULTURE: Colony Count: 50000

## 2010-07-31 LAB — DIFFERENTIAL
Basophils Absolute: 0.1 10*3/uL (ref 0.0–0.1)
Basophils Relative: 1 % (ref 0–1)
Eosinophils Absolute: 0.3 10*3/uL (ref 0.0–0.7)
Eosinophils Relative: 4 % (ref 0–5)
Lymphocytes Relative: 23 % (ref 12–46)
Lymphs Abs: 1.6 10*3/uL (ref 0.7–4.0)
Monocytes Absolute: 0.7 10*3/uL (ref 0.1–1.0)
Monocytes Absolute: 0.9 10*3/uL (ref 0.1–1.0)
Monocytes Relative: 10 % (ref 3–12)
Neutro Abs: 4.3 10*3/uL (ref 1.7–7.7)
Neutrophils Relative %: 61 % (ref 43–77)

## 2010-07-31 LAB — POCT I-STAT, CHEM 8
Chloride: 99 mEq/L (ref 96–112)
Creatinine, Ser: 1 mg/dL (ref 0.4–1.2)
Glucose, Bld: 164 mg/dL — ABNORMAL HIGH (ref 70–99)
Potassium: 2.8 mEq/L — ABNORMAL LOW (ref 3.5–5.1)

## 2010-07-31 LAB — CBC
HCT: 36 % (ref 36.0–46.0)
Hemoglobin: 12.5 g/dL (ref 12.0–15.0)
Hemoglobin: 13.2 g/dL (ref 12.0–15.0)
MCHC: 34.8 g/dL (ref 30.0–36.0)
MCV: 88.2 fL (ref 78.0–100.0)
RBC: 4.29 MIL/uL (ref 3.87–5.11)
RDW: 13.1 % (ref 11.5–15.5)
WBC: 7 10*3/uL (ref 4.0–10.5)

## 2010-07-31 LAB — BASIC METABOLIC PANEL
CO2: 26 mEq/L (ref 19–32)
Chloride: 95 mEq/L — ABNORMAL LOW (ref 96–112)
Glucose, Bld: 163 mg/dL — ABNORMAL HIGH (ref 70–99)
Potassium: 3.1 mEq/L — ABNORMAL LOW (ref 3.5–5.1)
Sodium: 131 mEq/L — ABNORMAL LOW (ref 135–145)

## 2010-07-31 LAB — POCT CARDIAC MARKERS: CKMB, poc: 1.6 ng/mL (ref 1.0–8.0)

## 2010-07-31 LAB — URINALYSIS, ROUTINE W REFLEX MICROSCOPIC
Bilirubin Urine: NEGATIVE
Hgb urine dipstick: NEGATIVE
Specific Gravity, Urine: 1.011 (ref 1.005–1.030)
pH: 7 (ref 5.0–8.0)

## 2010-08-21 ENCOUNTER — Other Ambulatory Visit: Payer: Self-pay | Admitting: *Deleted

## 2010-08-21 MED ORDER — ALBUTEROL SULFATE HFA 108 (90 BASE) MCG/ACT IN AERS: INHALATION_SPRAY | RESPIRATORY_TRACT | Status: DC

## 2010-09-04 NOTE — Assessment & Plan Note (Signed)
[facility]                             PULMONARY OFFICE NOTE   NAME:RHYNETekisha, Katie Alvarado                       MRN:          [id number]  DATE:03/13/2007                            DOB:          25-Feb-1926    PROBLEMS:  1. Bronchiectasis.  2. Chronic cough.  3. Insomnia.  4. Esophageal reflux.   HISTORY:  She continues to struggle with surgical issues of her foot,  dental work, and the stress of dealing with her chronically-ill husband.  She is going to Dr. Haroldine Laws today about sinusitis. He has given her an  antibiotic which she has not finished yet and she does not know the name  of it. She says that her nebulized ipratropium makes her ears roar, but  it does help her chest. She complains of sinus headache, but points to  both temples. She has had flu vaccine. Her cough has not substantially  improved over the last five months.   MEDICATIONS:  1. Premarin 0.625 mg.  2. Triamterene/hydrochlorothiazide 75/50.  3. Singulair 10 mg.  4. Nexium.  5. Spiriva.  6. Home nebulizer with ipratropium 0.02%.  7. Ultram ER 100 mg.  8. Albuterol rescue inhaler used occasionally.   She has had a flutter valve for pulmonary toilet, but she does not use  it regularly.   Drug intolerance to CELEBREX with hives, GLUCOSAMINE with hives.   OBJECTIVE:  Weight 179 pounds, blood pressure 124/68, pulse 86, room air  saturation 96%. Congested vibratory cough, which is non-productive.  Heart sounds regular without murmur. I find no adenopathy or neck vein  distension. No obvious nasal discharge. She is elevating her left foot  with no edema.   Chest CT from June had shown parenchymal opacities in the right upper  lobe lingula, left lower lobe, and right lower lobe with some  bronchiectatic change, favoring a prior inflammatory process and  scarring. Because of some nodularity, radiology recommended a follow up  CT in six months, and I discussed that with her  today.   IMPRESSION:  Bronchiectasis and chronic bronchitis with some  exacerbation over a period of months. It does not seem that this has  responded to the antibiotic that Dr. Haroldine Laws has tried for her sinuses  and it is appropriate that she is following up with him.   PLAN:  1. CT scan without contrast to compare with the June study.  2. Nebulized treatment with Xopenex here to assess response.  3. Schedule return two months, earlier p.r.n.     Clinton D. Maple Hudson, MD, Tonny Bollman, FACP  Electronically Signed    CDY/MedQ  DD: 03/14/2007  DT: 03/15/2007  Job #: [id number]   cc:   Caryn Bee L. Little, M.D.

## 2010-09-04 NOTE — Assessment & Plan Note (Signed)
[facility]                             PULMONARY OFFICE NOTE   NAME:Katie Alvarado, Katie Alvarado                       MRN:          [id number]  DATE:09/12/2006                            DOB:          [birth year]    PROBLEM:  1. Bronchiectasis.  2. Chronic cough.  3. Insomnia.  4. Esophageal reflux.   HISTORY:  Has had a distinct cold with increase in her cough.  [place] and began active productive cough with green sputum.  Got better on own, but feels now she has relapsed some.  No fever.  Tussive point tenderness left costochondral joints.  Cannot sleep  through the night on Ambien and says Alfonso Patten never worked.  Indicates  this is a separate problem, more related to stress of husband's chronic  illness.  We discussed sleep hygiene, realistic treatment options and  expectations.   MEDICATION:  1. Premarin 0.625 mg.  2. Triamterene HCT 75.  3. Singulair 10 mg.  4. Nexium.  5. Spiriva home nebulizer with ipratropium 0.02% q.i.d. p.r.n.  6. Previous trials of Ambien 5 mg and Tylenol No. 3 for cough and      sleep.   DRUG INTOLERANCE:  1. CELEBREX with hives.  2. GLUCOSAMINE with hives.   OBJECTIVE:  VITAL SIGNS:  Weight 176 pounds, blood pressure 120/78,  pulse 72, room air saturation 97%.  GENERAL:  Congested cough.  HEART:  Hear sounds regular.  LYMPHATICS:  No adenopathy.  LUNGS:  No stridor.   IMPRESSION:  1. Exacerbation of chronic bronchitis and bronchiectasis.  2. Chronic nonspecific insomnia, stress aggravated.   PLAN:  1. Avelox 400 mg daily for 7 days.  2. Try samples of Ambien CR 6.5 mg since her primary complaint is that      she awakes after sleep onset.  3. We refilled Spiriva, albuterol and Tranxene with appropriate      discussion of overlap of sedating medications at her age.  4. Scheduled chest x-ray.  5. Scheduled return in 6 months; earlier p.r.n.     Clinton D. Maple Hudson, MD, Tonny Bollman, FACP  Electronically  Signed    CDY/MedQ  DD: 09/14/2006  DT: 09/14/2006  Job #: [id number]   cc:   Caryn Bee L. Little, M.D.

## 2010-09-04 NOTE — Assessment & Plan Note (Signed)
[facility]                             PULMONARY OFFICE NOTE   NAME:Katie Alvarado, Katie Alvarado                       MRN:          [id number]  DATE:11/18/2006                            DOB:          04/19/1926    PROBLEMS:  1. Bronchiectasis.  2. Chronic cough.  3. Insomnia.  4. Esophageal reflux.   HISTORY:  Still struggling with residual soreness after orthopedic work  on her foot.  More recently she went through four rounds of antibiotics  for a root canal on the left and then also took some prednisone, which  ended today.  She describes considerable stress aggravating her  insomnia, blamed on having to care for her ill husband.  She asks [place] to  refill Tranxene to help her sleep.  I had a note here from a  granddaughter, calling and asking we do a chest x-ray for this visit,  but I missed that note.  The patient felt she had enough antibiotics,  and was very pleased at how clear she was feeling, so I think we can  wait on the chest x-ray.   MEDICATIONS:  1. Premarin 0.625 mg.  2. Triamterene/hydrochlorothiazide 75/50 mg.  3. Singulair 10 mg.  4. Nexium.  5. Spiriva.  6. Home nebulizer with Ipratropium.  7. Ultram.  8. Albuterol inhaler p.r.n.  9. Tranxene 3.75 mg had used twice daily in the past.   OBJECTIVE:  VITAL SIGNS:  Weight 175 pounds, blood pressure 138/78,  pulse 96, room air saturation 98%.  HEART:  Sounds are regular without murmur.  CHEST:  Is really very clear.  There are very minimal crackles in the  left base where she always has evidence of a bronchiectasis, but she  sounds much better than most times when I have heard her, with no  suggestion of an ongoing process.  NECK:  No adenopathy.  EXTREMITIES:  No edema.   IMPRESSION:  1. Stable bronchiectasis after recent antibiotics for unrelated      problem.  2. Insomnia with anxiety.   PLAN:  1. I agreed to refill the Tranxene we had given her for insomnia in      the past  at 3.75 mg q.h.s. p.r.n.  2. Schedule a return in six months, or earlier p.r.n.  3. Consider a chest x-ray on return, earlier p.r.n.     Katie D. Maple Hudson, MD, Tonny Bollman, FACP  Electronically Signed    CDY/MedQ  DD: 11/18/2006  DT: 11/19/2006  Job #: [id number]   cc:   Katie Alvarado, M.D.

## 2010-09-07 NOTE — Discharge Summary (Signed)
[facility]  Patient:    KYANNA, MAHRT                         MRN: [id number] Adm. Date:  [id number] Disc. Date: [id number] Attending:  Charlton Haws                           Discharge Summary  ADMITTING DIAGNOSIS:  Abdominal pain following right hemicolectomy.  OPERATIONS/PROCEDURES:  CT guided aspiration of pelvic fluid collection on October 30, 1999.  HISTORY OF PRESENT ILLNESS:  Ms. Baade is a 77 year old white female who underwent a right hemicolectomy for cecal diverticulitis and was discharged home on July 4 doing well.  She did well at home for the first several days but now presents with 24 hours of decreased appetite and weakness and subjective chills.  She was seen in the emergency room and admitted for further evaluation.  PAST MEDICAL HISTORY:  Please see detailed records from her previous admission two weeks ago.  CURRENT MEDICATIONS: 1. Hydrochlorothiazide. 2. Premarin.  PERTINENT PHYSICAL EXAMINATION:  VITAL SIGNS:  Pulse was 109, respirations 20, blood pressure 126/89, temperature 98.  ABDOMEN:  Abdominal exam revealed some moderate lower abdominal tenderness slightly more on the right.  ADMISSION LABORATORY:  White count was normal as were chemistries and urinalysis.  Plain x-rays were normal.  HOSPITAL COURSE:  The patient was admitted for IV fluids and further evaluation.  A CT scan of the abdomen was obtained on July 9, which revealed a pelvic fluid collection, questionably abscessed.  Subjectively at this point, she was improved.  However, with this finding on CT, she was started on IV Unasyn and CT guided aspiration was performed on July 10.  The fluid did not appear grossly infected and subsequent cultures were negative, and her antibiotics were discontinued on July 11.  At this point, she was feeling significantly better.  Her abdomen was benign.  She was tolerating a diet. She was discharged home on July  12.  DISCHARGE MEDICATIONS:  Same as admission.  FOLLOW-UP:  Follow-up with me in my office in two weeks. DD:  12/12/99 TD:  12/13/99 Job: [id number]

## 2010-09-07 NOTE — H&P (Signed)
[facility]  Patient:    Katie Alvarado, Katie Alvarado                         MRN: [id number] Adm. Date:  10/27/99 Attending:  Currie Paris, M.D.                         History and Physical  Account:  [id number]  CHIEF COMPLAINT:  Weakness.  HISTORY OF PRESENT ILLNESS:  Ms. Katie Alvarado is a 77 year old lady who underwent a right hemicolectomy for chronic diverticulitis about one week ago.  She was discharged on October 24, 1999, doing well, and had done well for about 48 hours; but the last 24  hours has had a decreased appetite, decreased p.o. intake, and has generally felt weak.  She has also felt like she is getting hot and feverish, and then gets cold again.  She was ambulating better.  Now she feels so weak she really does not feel like she can walk and ambulate about her home.  Because of her symptoms, she was seen here in the emergency room.  PAST MEDICAL HISTORY:  Detailed in her prior admission notes, copies of which have been placed on the chart for this admission.  CURRENT MEDICATIONS AT HOME: 1. Hydrochlorothiazide. 2. Premarin.  ALLERGIES:  No known drug allergies.  PHYSICAL EXAMINATION:  GENERAL:  The patient is alert and oriented, in no acute distress.  VITAL SIGNS:  Pulse 109, respirations 20, blood pressure 126/89, temperature 97.5 degrees.  HEENT:  Head normocephalic.  Eyes:  Anicteric.  Pupils round and regular. Tongue is moist.  NECK:  Supple.  LUNGS:  Clear to auscultation.  HEART:  Regular, no murmurs, rubs, or gallops.  ABDOMEN:  Soft, but she has some lower abdominal tenderness, maybe more on the right than the left.  Bowel sounds are present.  PELVIC:  Not done.  RECTAL:  Not done.  EXTREMITIES:  She has no cyanosis or edema.  LABORATORY DATA:  Unremarkable with a normal white count and normal electrolytes. Urinalysis shows a specific gravity of 1008.  IMPRESSION:  Status post right colectomy with weakness,  possibly mild volume depletion, although hard to document by laboratory studies.  PLAN:  Will admit and put her on some IV fluids, leave her on a regular diet. Recheck her white count in the morning, to be sure she is not developing some other process that we are unaware of, and at that point determine whether she needs to stay or can go home. DD:  10/27/99 TD:  10/27/99 Job: [id number]

## 2010-09-07 NOTE — Discharge Summary (Signed)
[facility]  Patient:    Katie Alvarado, Katie Alvarado                         MRN: [id number] Adm. Date:  [id number] Disc. Date: 10/01/99 Attending:  Glenna Fellows Tappan                           Discharge Summary  DISCHARGE DIAGNOSES:  Right lower quadrant pain and inflammatory thickening of cecum of uncertain etiology.  OPERATIONS AND PROCEDURES:  None.  HISTORY OF PRESENT ILLNESS:  This patient is a 77 year old white female who five days prior to this admission developed a fairly rapid onset of significant right lower quadrant abdominal pain.  She presented to [facility] for evaluation.  She has a history of appendectomy in the 1940s.  She was found to be tender in the right lower quadrant of the abd. A CT scan of the abdomen was obtained.  This revealed diffuse thickening of the cecal wall consistent with inflammation while a tumor could not be completely ruled out.  She was started on p.o. Cipro and Flagyl.  She presented for reevaluation on the day of admission and was found to have marked increased tenderness and was referred and is now admitted.  She reports her pain has been essentially unchanged or possibly slightly worse over the last several days.  She has had no nausea or vomiting and has been eating relatively normal with some decreased appetite.  She had some loose stools only after her CT, but, otherwise, bowel movements have been normal.  She has no chronic history of abdominal pain or any significant symptoms leading up to this.  PAST MEDICAL HISTORY/SURGICAL HISTORY:  Hysterectomy through a low midline incision and appendectomy.  She is treated for hypertension and asthma.  MEDICATIONS:  Hydrochlorothiazide 25 mg a day; Premarin daily; Cipro and Flagyl p.o. with this illness; and Atrovent and Vanceril inhalers.  ALLERGIES:  No known drug allergies.  SOCIAL HISTORY:  For details, see admission H&P.  FAMILY HISTORY:   For details, see admission H&P.  REVIEW OF SYSTEMS:  For details, see admission H&P.  PERTINENT PHYSICAL EXAMINATION: VITAL SIGNS:  She is afebrile. Vital signs are within normal limits. ABDOMEN:  Exam revealed well-localized and quite marked right lower quadrant tenderness with guarding.  There are signs of local peritoneal irritation with rebound pain and pain with coughing.  ADMISSION LABORATORY:  Urinalysis clear.  White count is 8,400 which had been elevated earlier in the week.  Hemoglobin 13.  PTT/PT normal.  X-RAYS:  CT scan of the abdomen was repeated in the emergency room which shows essentially stable changes of thickening in the cecum with no evidence of perforation or other complication.  HOSPITAL COURSE:  The patient was admitted and placed on IV broad spectrum antibiotics, Unasyn.  The differential diagnosis included cecal diverticulitis, stump appendicitis, less likely Crohns disease or tumor.  She was put on a clear diet and treated with analgesics.  Her hospital course was one of gradually improving pain and tenderness on a daily basis.  By October 01, 1999, she had minimal abdominal pain and was only very mildly tender in the right lower quadrant.  She is felt to be much improved.  She is discharged at this time on p.o. Keflex and Flagyl.  Follow up is to be in my office in two weeks.  Colonoscopy is planned as an outpatient. DD:  10/22/99 TD:  10/22/99 Job: [id number]

## 2010-09-07 NOTE — Discharge Summary (Signed)
[place]  Patient:    Katie Alvarado, Katie Alvarado                         MRN: [id number] Adm. Date:  [id number] Disc. Date: [id number] Attending:  Charlton Haws                           Discharge Summary  DISCHARGE DIAGNOSIS:  Cecal diverticulitis with mural abscess.  OPERATION/PROCEDURES:  Right colectomy on October 19, 1999.  HISTORY OF PRESENT ILLNESS:  Ms. Katie Alvarado is a 77 year old white female discharged about one week ago following hospitalization for right lower quadrant abdominal pain.  The patient gives a history of intermitted right lower quadrant abdominal pain for several months.  This worsened on June 8, and she was evaluated at [facility] and found to have marked right lower quadrant tenderness.  She has a history of an appendectomy in the 1940s.  A CT scan of the abdomen revealed diffuse thickening of the cecal wall consistent with inflammatory or neoplastic change.  White count was elevated.  The patient was admitted and placed on IV antibiotics for probable cecal diverticulitis, and she gradually improved over several days.  She was discharged home a week ago on oral antibiotics.  At that time, she had very mild right lower quadrant tenderness.  Over the last several days, she has experienced increasing right lower quadrant pain again and presented to the office and was found again to be markedly tender.  Due to worsening symptoms on oral antibiotics at home following a fairly prolonged course of treatment, I have recommended proceeding with right colectomy.  The patient is admitted on the morning of her procedure.  PAST MEDICAL HISTORY:  Includes hysterectomy through low midline incision and appendectomy.  She is also treated for hypertension and asthma.  MEDICATIONS: 1. Hydrochlorothiazide 25 mg a day. 2. Premarin 0.625 mg a day. 3. P.O. Cipro and Flagyl for the past week. 4. Flovent and Serevent  inhalers.  ALLERGIES:  No known drug allergies.  SOCIAL HISTORY, FAMILY HISTORY, REVIEW OF SYSTEMS:  See detailed H&P.  PERTINENT PHYSICAL EXAMINATION:  She is afebrile, and vital signs are all within normal limits.  Pertinent findings were limited to the abdomen which revealed well localized, significant right lower quadrant tenderness with some guarding.  No palpable masses or hepatosplenomegaly.  ADMISSION LABORATORY DATA:  White count 7900, hematocrit 32.  Chemistries abnormal for a sodium of 134, glucose 136, and remainder normal.  HOSPITAL COURSE:  The patient had undergone mechanic and antibiotic bowel prep.  She was taken to the operating room on June 29 and underwent uneventful right hemicolectomy with findings of some thickening and inflammation of the cecal wall.  Postoperative course was fairly smooth.  NG tube was not used. She had some fever over the second and third postoperative days up to 101.5 which was felt to be due to atelectasis and responded to pulmonary toilet. Her Foley was discontinued.  She was started on clear liquids on the second postop day which she tolerated well.  Followup CBC was unremarkable on the third postop day.  Her tenderness around her lower abdomen incision gradually increased.  She did have some increased pain when the epidural catheter was removed on July 3.  This continued to improve following this.  Final pathology revealed diverticulitis with mural abscess.  Her diet was able to be gradually advanced, and  she was felt ready for discharge on October 24, 1999.  Her wound was healing nicely.  Abdomen was benign.  She was on a regular diet.  She was given a prescription for Vicodin.  Followup is to be in my office in 7 to 10 days. DD:  11/14/99 TD:  11/16/99 Job: [id number]

## 2010-09-07 NOTE — H&P (Signed)
[facility]  Patient:    Katie Alvarado, Katie Alvarado                         MRN: [id number] Adm. Date:  [id number] Attending:  Glenna Fellows Tappan                         History and Physical  CHIEF COMPLAINT:  Right lower quadrant abdominal pain.  HISTORY OF PRESENT ILLNESS:  The patient is a 77 year old white female who reports that five days ago she developed the fairly rapid onset of significant right lower quadrant abdominal pain.  She presented soon after to [facility] for evaluation.  She has a history of an appendectomy in the 1940s.  She was found to be tender in the right lower quadrant and a CT scan of the abdomen was obtained.  This revealed some diffuse thickening of the cecal wall consistent with inflammation and tumor could not be completely ruled out; she was started on p.o. Cipro and Flagyl.  The patient presented to [facility] for reevaluation today and was found to be quite tender in the right lower quadrant.  She reports her pain has been essentially unchanged or possibly slightly worse since its onset.  She has not had nausea or vomiting and has been eating relatively normal with just some slight decreased appetite.  She had some loose stools following her CT scan contrast several days ago but generally, her bowel movements have been normal without blood or melena.  She has no history of any chronic lower abdominal pain or GI complaints leading up to his.  She denies fever or chills.  PAST MEDICAL HISTORY:  Surgical history includes a hysterectomy through a low midline incision and an appendectomy, as above.  She is also treated for hypertension and asthma.  MEDICATIONS: 1. Hydrochlorothiazide 25 mg a day. 2. Premarin daily. 3. Cipro and Flagyl p.o. with this illness. 4. Atrovent and a steroid inhaler.  ALLERGIES:  She has no known drug allergies.  SOCIAL HISTORY:  She is married and  accompanied by her husband.  She does not smoke cigarettes or drink alcohol.  FAMILY HISTORY:  Noncontributory.  REVIEW OF SYSTEMS:  GENERAL:  Denies weight loss, chronic malaise, fever or chills.  LUNGS:  Positive occasional wheezing.  CARDIAC:  Denies history of cardiac disease, chest pain, palpitations.  ABDOMEN:  Positive as above.  GU: No urinary frequency or burning.  EXTREMITIES:  No joint pain or swelling. NEUROLOGIC:  No numbness, weakness or syncope.  PHYSICAL EXAMINATION:  VITAL SIGNS:  Temperature is 98, pulse 88 and regular, respirations 20, blood pressure 145/90.  GENERAL:  She is a well-developed, well-nourished white female who appears uncomfortable.  SKIN:  Warm and dry, without rash or infection.  HEENT:  Sclerae clear and nonicteric.  Pupils are equal, round and reactive to light.  NECK:  No adenopathy or thyromegaly.  LUNGS:  Clear to auscultation.  CARDIAC:  Regular rate and rhythm without rubs, murmurs or gallops.  No JVD or peripheral edema.  ABDOMEN:  Well-healed low midline incision.  Bowel sounds are present but decreased.  No distention.  There is well-localized and quite marked right lower quadrant tenderness with guarding.  There are signs of local peritoneal irritation with rebound tenderness and pain with coughing.  I cannot appreciate any masses or hepatosplenomegaly.  EXTREMITIES:  No edema or deformity.  NEUROLOGIC:  Grossly normal.  LABORATORY AND X-RAY FINDINGS:  Urinalysis is clear.  WBC is 8400, which is decreased from earlier in the week.  Hemoglobin 13.  PT/PTT normal. Electrolytes and liver function tests all within normal limits.  A CT scan of the abdomen was repeated in the emergency room today.  This shows essentially stable changes of thickening in the cecum or possibly some reduction in the process with no evidence of perforation or other complication.  ASSESSMENT AND PLAN:  Right lower quadrant abdominal pain with  apparent inflammatory changes of the cecum.  Neoplasm cannot be completely ruled out. Possibilities would include cecal diverticulitis, stump appendicitis or carcinoma.  Crohns disease would be much less likely in this location.  As the CT and white count appear to show stability or some improvement, she will be admitted for pain control and intravenous antibiotics and followed closely. If this resolves without surgery, she will need a colonoscopy. DD:  09/28/99 TD:  09/29/99 Job: [id number]

## 2010-09-07 NOTE — Assessment & Plan Note (Signed)
[facility]                               PULMONARY OFFICE NOTE   NAME:Katie Alvarado, Katie Alvarado                       MRN:          [id number]  DATE:12/03/2005                            DOB:          [birth year]    PROBLEM LIST:  1. Bronchiectasis.  2. Chronic cough.  3. Insomnia.  4. Esophageal reflux.   HISTORY:  One week ago, she went to the emergency room with increased cough.  Labs from that visit describe an arterial blood gas on 2 liters prongs with  pH 7.45, pCO2 42, pO2 160, bicarbonate 29.  White blood count was 7800,  hemoglobin 12.1.  Chest x-ray described no evidence of acute pneumonia but  noted some mild volume loss in the lingula which she has had before,  consistent with her bronchiectatic scarring.  She says she was given an  antibiotic, but she has no idea what it was.  Her husband had a bronchitis  illness about two weeks prior, and she does not know if she caught this from  him.  Nexium prevents reflux but she says she has to avoid chocolate.  She  has not recognized waking at night with any acute reflux event that might  have caused an acute respiratory discomfort.  She still feels somewhat short  of breath, congested, and just somewhat uncomfortable without real pain,  fever, or chills and without anything purulent or bloody.   MEDICATIONS:  Premarin has been discontinued.  She continues  triamterene/hydrochlorothiazide 75/50, Singulair 10 mg, Nexium, home  nebulizer with ipratropium used up to four times a day p.r.n., Albuterol  inhaler, Spiriva.  She does have a flutter for pulmonary toilet but had  forgotten to use it until I reminded her.   OBJECTIVE:  VITAL SIGNS:  Weight 175 pounds, BP 138/76, pulse regular 84,  room air saturation 95%.  LUNGS:  Deep rattling chest congestion, some  transmitted rhonchi without wheeze and without increased work of breathing.  HEART:  Heart sounds are normal, no adenopathy, and no peripheral  edema.   IMPRESSION:  Recent acute exacerbation of bronchitis with chronic  bronchitis/bronchiectasis.  This kind of syndrome is always a warning of  possible intermittent reflux, and I discussed that with her today.  She is  still finishing an antibiotic and a prednisone taper she does not know much  about.   PLAN:  Mucinex twice a day, fluids, nebulizer treatment here with Xopenex  1.25 mg.  As she finishes her antibiotic, she will hold a prescription for  Omnicef 300 mg twice a day for p.r.n.  additional use as discussed.  Tussionex 200 ml/5 cubic centimeters every 12  hours p.r.n. cough, Depo-Medrol 80 mg intramuscularly.  Schedule return in 2-  3 weeks, earlier p.r.n.                                   Clinton D. Maple Hudson, MD, FCCP, FACP   CDY/MedQ  DD:  12/03/2005  DT:  12/03/2005  Job #:  [id number]  cc:   Caryn Bee L. Little, MD

## 2010-09-07 NOTE — Op Note (Signed)
NAME:  Katie Alvarado, PELLOT                          ACCOUNT NO.:  [id number]   MEDICAL RECORD NO.:  [id number]                   PATIENT TYPE:  AMB   LOCATION:  DSC                                  FACILITY:  [facility]   PHYSICIAN:  Leonides Grills, M.D.                  DATE OF BIRTH:  03/31/1926   DATE OF PROCEDURE:  09/14/2002  DATE OF DISCHARGE:                                 OPERATIVE REPORT   PREOPERATIVE DIAGNOSIS:  1. 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  2. Left fibular spur.  3. Left peroneal tenosynovitis.  4. Left subluxing peroneal tendons.  5. Left anterolateral talar dome osteochondral lesion.   POSTOPERATIVE DIAGNOSIS:  1. 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  2. Left fibular spur.  3. Left peroneal tenosynovitis.  4. Left subluxing peroneal tendons.  5. Left anterolateral talar dome osteochondral lesion.   OPERATION PERFORMED:  1. Left ankle arthroscopy with extensive debridement.  2. Left ankle arthroscopy with debridement drilling anterolateral talar dome     osteochondral lesion.  3. Left excision fibular spur.  4. Left tenosynovectomy, peroneal tendon.  5. Left repair of subluxing peroneal tendons.   SURGEON:  Leonides Grills, M.D.   ASSISTANT:  Lianne Cure, P.A.   ANESTHESIA:  General endotracheal tube.   ESTIMATED BLOOD LOSS:  Minimal.   TOURNIQUET TIME:  Approximately 45 minutes.   COMPLICATIONS:  None.   DISPOSITION:  Stable to PR.   INDICATIONS FOR PROCEDURE:  The patient is a 77 year old female who has had  anterolateral as well as posterolateral ankle pain after a left lateral  malleolus fracture.  She was having pain that was interfering with her life  to the point where she could not do what she wanted to do.  She was  consented to the above procedure.  All risks which include infection,  neurovascular injury, persistent pain,  worsening pain, stiffness, arthritis  were all explained, questions were encouraged and answered.   DESCRIPTION OF PROCEDURE:  The patient was brought to the operating room and  placed in supine position.  After adequate general endotracheal tube  anesthesia was administered as well as Ancef 1g IV piggyback.  A beanbag was  then bumped up into a slight lateral position and the left lower extremity  was then prepped and draped in a sterile manner.  We then marked out our  landmarks including the anterior tibialis and superficial peroneal nerve.  We then instilled approximately 20mL normal saline into the ankle and then  created our anterior medial and  anterior lateral portals involving the left  ankle using nick and spread technique.  After a blunt tip trocar was placed  into the ankle and the cannula was placed.  We then identified there was a  large amount of synovitis on the  anterior aspect of the ankle including the  accessory tibial ligament that was rubbing on the anterolateral corner of  the talar dome.  Not only was there a large amount of synovitis in this area  but there was also an osteochondral lesion involving the talar dome in this  area as well. After the synovitis and the accessory tib-fib ligament were  excised meticulously with the shaver as well as an [employer] wand and  hemostasis was obtained, the osteochondral lesion was then debrided  meticulously with a shaver and then microfracture technique was performed to  this area.  It probably measured approximately 3 x 3 mm.  Once this was  done, the scope was removed.  Pictures were obtained throughout the  procedure.  The wound was closed with 4-0 nylon.  We then gravity  exsanguinated the left lower extremity and a tourniquet was elevated to 290  mmHg.  A longitudinal incision measuring approximately 1 cm was then made  over the fibular spur that was palpable through the skin and was quite sharp  and irritating the  patient.  Dissection was carried down to the spur.  Soft  tissue was elevated around the spur and the spur was removed with a rongeur.  This was then palpated through the skin was flush.  The skin was closed with  4-0 nylon suture.  We then made a longitudinal incision just posterior to  the lateral malleolus.  Dissection was carried down through skin.  Hemostasis was obtained.  Soft tissue structures were protected posteriorly  to include the sural nerve but this was not formally dissected out.  Once  this was done, a longitudinal incision approximately 2 to 3 mm posterior to  the lateral malleolus posterolateral corner was then made.  Dissection was  carried down through peroneal retinaculum.  The peroneal tendons were  protected throughout the retinacular incision.  Once this was done, the  peroneal tendons herniated out and there was a large amount of synovitis in  this area.  This was then meticulously debrided with scissors and scalpel.  This included a distalized peroneus brevis muscle belly as well.  Once this  was done, the tendons were then dislocated and examined and there was no  frank tear within the tendons.  The groove posteriorly was then palpated and  again there was no spur off the posterior aspect of the fibula and the  groove was adequate concavity.  The area was copiously irrigated with normal  saline.  A trough was then created using a curved quarter inch osteotome and  a rongeur on the anterolateral edge of the lateral malleolus.  Once this was  awled and capped, a 5.5 absorbable corkscrew suture anchor was then placed  with #2 FiberWire.  Once this was done, the tendons were relocated and then  we advanced the peroneal retinaculum to the trough.  We had an excellent  repair.  The remaining part of the retinaculum was closed with 2-0  FiberWire.  Once this was done, this had an excellent repair of the peroneal tendons.  We then ranged the ankle and the peroneal tendons  had adequate  excursion and ranged as well.  The wound was copiously irrigated with normal  saline.  The tourniquet was deflated.  Hemostasis was obtained.  The  subcutaneous tissue was closed with 3-0 Vicryl, skin was closed with 4-0  nylon over all wounds.  Sterile dressing was applied.  Cam walker boot was  applied.  The patient went  stable to the PR.                                                Leonides Grills, M.D.    PB/MEDQ  D:  09/14/2002  T:  09/14/2002  Job:  [id number]

## 2010-09-07 NOTE — H&P (Signed)
. [facility]  Patient:    Katie Alvarado, Katie Alvarado                         MRN: [id number] Adm. Date:  [id number] Disc. Date: [id number] Attending:  Glenna Fellows Tappan                         History and Physical  CHIEF COMPLAINT:  Right lower quadrant abdominal pain.  HISTORY OF PRESENT ILLNESS:  Katie Alvarado is a 77 year old white female who was discharged about 1 week ago following hospitalization for right lower quadrant pain.  The patient gives a history of some intermittent lower abdominal pain over the last several months.  Her pain worsened on September 28, 1999, and she [facility] for evaluation and was found to have marked right lower quadrant tenderness on exam.  She has a history of an appendectomy in the 1940s.  A CT Scan of the abdomen was obtained at that time which revealed that diffuse thickening of the cecal wall consistent with either inflammatory or neoplastic change.  At that time the white count was elevated as well.  The patient was admitted at that time and placed on IV, antibiotics, for possible cecal diverticulitis or an inflammatory change of the cecum.  Over a period of several days she gradually improved and her white count normalized.  She was discharged home approximately 1 week ago on oral antibiotics.  At that time having mild discomfort and tenderness in the right lower quadrant. However, over the last several days she has experienced increasing pain and presented urgently to the office 2 days ago with steadily worsening right lower quadrant pain and again was found to be markedly tender with guarding in the right lower quadrant.  White count, however, at that time was normal.  Due to worsening symptoms on antibiotic treatment and a CT scan showing a significant thickening of the cecal wall and neoplasm and not being able to rule out I have at this point recommended proceeding with right colectomy. The  patient is admitted on the morning of her procedure following an mechanical antibiotic bowel prep at home.  PAST MEDICAL HISTORY/SURGICAL HISTORY:  Includes hysterectomy through low midline incision and appendectomy.  She was also treated for hypertension and asthma.  MEDICATIONS: 1. Hydrochlorothiazide 25 mg a day. 2. Premarin 0.625 mg a day. 3. P.o. Cipro and Flagyl over the past week. 4. Flovent and Serevent inhalers.  ALLERGIES:  She has no known drug allergies.  SOCIAL HISTORY:  She is married.  Does not smoke cigarette or drinks alcohol.   FAMILY HISTORY:  Noncontributory.  REVIEW OF SYSTEMS:  General:  Denies weight loss, chronic malaise, fever, chills.  Lungs:  Positive for occasional wheezing.  Cardiac: Denies a history of cardiac disease, chest pain or palpitations.  Abdomen:  Positive as above. Urinary:  No frequency or burning.  Extremities:  No joint pain or swelling. Neurologic:  No numbness, weakness, syncope.  PHYSICAL EXAMINATION:  VITAL SIGNS:  Temperature 98.0, pulse 76, respirations 18, blood pressure 124/70.  GENERAL:  She is a well-developed white female who appears somewhat uncomfortable.  SKIN:  Warm and dry without rash or infection.  HEENT:  No palpable cervical or supraclavicular adenopathy.  Sclerae clear.  LUNGS:  Clear to auscultation.  CARDIAC:  Regular rate and rhythm without rubs, murmurs or gallops.  ABDOMEN:  There is a well-healed low midline  incision.  Bowel sounds are present.  No distention.  There is well localized significant right lower quadrant tenderness with some guarding.  I cannot feel any masses or hepatosplenomegaly.  RECTAL:  Normal.  Heme negative.  EXTREMITIES:  No cyanosis, edema, deformity.  ASSESSMENT AND PLAN:  Persistent and now worsening right lower quadrant pain and tenderness despite a 1 week course of IV antibiotics in the hospital and 1 week course of p.o. antibiotics.  Her CT scans have shown a diffuse  thickening of the cecum which could be consistent with a neoplasm, either carcinoma of the cecum, or lymphoma or possible inflammatory change such as cecal diverticulitis or stump appendicitis.  The small bowel appears normal and I this is much less likely to be due to Crohns disease.  Options have been discussed with the patient and her husband.  Colonoscopy was discussed but with her worsening clinical course I felt that this was not going to change her need for surgery whether this was inflammatory or neoplastic.  Due to worsening pain and tenderness on medical management and CT findings as above, we are proceeding with right hemicolectomy and she is admitted for this procedure. DD:  10/19/99 TD:  10/19/99 Job: [id number]

## 2010-09-07 NOTE — Discharge Summary (Signed)
NAMESYDNIE, Katie Alvarado                          ACCOUNT NO.:  [id number]   MEDICAL RECORD NO.:  [id number]                   PATIENT TYPE:  INP   LOCATION:  0465                                 FACILITY[facility]  [facility]   PHYSICIAN:  Melissa L. Ladona Ridgel, MD               DATE OF BIRTH:  02-May-1925   DATE OF ADMISSION:  07/11/2003  DATE OF DISCHARGE:  07/16/2003                                 DISCHARGE SUMMARY   ADMITTING DIAGNOSES:  1. Left lower lobe pneumonia.  2. Hypertension.  3. Depression.  4. Asthma.  5. Possible gastroesophageal reflux disease.   DISCHARGE DIAGNOSES:  1. Left lower lobe pneumonia, being treated with Augmentin 500/125 b.i.d.     and azithromycin 250 mg 1daily.  2. Hypertension is at baseline, off her Maxzide and off of the Clonidine     prescribed for her lower extremity pain.  3. Asthma/bronchitis, continues to be exacerbated by (1) Her Singular was     discontinued.  4. Depression.  The patient remains on Lexapro 10 mg 1 daily.  5. Gastroesophageal reflux disease.  The patient will continue with her     Nexium 40 mg daily.   MEDICATION LIST AT THE TIME OF DISCHARGE:  1. Lexapro 10 mg 1 daily.  2. Premarin 0.625 mg 1 daily.  3. Nexium 40 mg daily.  4. Augmentin 500/125 b.i.d.  5. Humibid 1200 mg p.o. b.i.d.  6. Nebulizers, Xopenex 0.63 nebs q.6h.; Atrovent 0.5 mg q.6h.; azithromycin     250 mg 1 daily.  7. She is to resume her __________DS, folic acid, and Caltrate.  8. She will continue with her Ambien 5 mg.  Only a limited supply was     provided.  9. The patient was instructed on the use of Clonidine and requested not to     use that until she sees Dr. Clarene Duke.  The patient received the Clonidine     and Neurontin 300 mg b.i.d. from her pain physician.  The patient did not     know anything about this drug and once reminded, said oh yes I was     prescribed that.  She had not shared that information with Dr. Clarene Duke or     Dr. Maple Hudson.   HISTORY OF PRESENT  ILLNESS:  The patient is a 77 year old white female with  known asthma/bronchitis and possible bronchiectasis, who presented to the  emergency room with complaint of shortness of breath and cough greater than  one week.  The patient spoke with Dr. Maple Hudson as an outpatient when prescribed  a Z-Pak.  Her course continued to deteriorate despite antibiotics, and so  she came to the emergency room.  She was found to be hemodynamically stable.  X-ray was showing a left lower lobe infiltrate versus atelectasis.  She was  therefore admitted for IV antibiotics and further supportive treatment.  The  patient was admitted to general medical  floor, place on supportive 02.  She  was started on aggressive pulmonary toilet with nebulizers, Humibid, and a  flutter valve.  Over the course of the hospitalization, she encountered an  erythematous reaction to the moxifloxacin which was infusing, and this was  therefore discontinued, and she was switched to oral antibiotic as she had  already received 3 doses of antibiotic IV.  The patient progressed well over  the course of the hospitalization.  She was able to wean from her O2,  ambulate the hallway, and was only limited by her ankle pain related to a  chronic condition.  On the day of discharge, the patient's vital signs  revealed temperature of 97.5, blood pressure 117/63, pulse 68, respiratory  rate 20.  She was 99% on room air.   Pertinent laboratory values during the course of this hospitalization  reveals a discharging BMET with sodium 140, potassium 3.9, chloride 106, CO2  27, glucose 93, BUN 9, creatinine 1.0.  Her LFTs were within normal limits.  Her magnesium level was 1.5.  Her CBC was 4.3 with a hemoglobin of 11.2,  hematocrit 32.1, platelet count 210 on March 23.  Occult fecal blood was  obtained during the hospitalization.  Three sets were negative.  One set,  however, did reveal positive and because of the other tests being negative,  it was  felt this was an outlier.  Clostridium difficile was performed on  March 24 which was negative.  The patient states at the time of discharge  that her diarrhea has all but left her.  On admission, an important  laboratory value is her blood gas of 7.43, CO2 38, pO2 74, bicarb 24.9.   GENERAL PHYSICAL EXAM AT THE TIME OF DISCHARGE:  HEENT:  Normocephalic,  atraumatic.  Pupils equal, round, and reactive to light.  Extraocular  muscles were intact.  Her mucous membranes were moist.  NECK:  Supple.  There was no JVD.  CHEST:  Clear to auscultation anteriorly with some scattered rhonchi that  cleared with cough.  Posteriorly, she had no end expiratory wheeze.  ABDOMEN:  Soft, nontender, nondistended with positive bowel sounds.  LOWER EXTREMITY:  2+ pulses, no edema.   On the day of discharge, the patient was significantly decreased in her  dyspnea.  She was able to ambulate without a problem.  She was eating well.  She was generally considered stable for discharge with follow-up with Dr.  Maple Hudson about the middle of the week.                                               Melissa L. Ladona Ridgel, MD    MLT/MEDQ  D:  07/19/2003  T:  07/19/2003  Job:  [id number]   cc:   Dr. Clarene Duke   Dr. Maple Hudson

## 2010-09-07 NOTE — H&P (Signed)
Katie Alvarado, Katie Alvarado                          ACCOUNT NO.:  [id number]   MEDICAL RECORD NO.:  [id number]                   PATIENT TYPE:  INP   LOCATION:  0105                                 FACILITY:  [facility]   PHYSICIAN:  Deirdre Peer. Polite, M.D.              DATE OF BIRTH:  08/24/1925   DATE OF ADMISSION:  07/11/2003  DATE OF DISCHARGE:                                HISTORY & PHYSICAL   CHIEF COMPLAINT:  Shortness of breath, cough.   HISTORY OF PRESENT ILLNESS:  Katie Alvarado is a 77 year old female with a known  history of hypertension, asthma, chronic bronchitis, depression, who  presents to the ED for evaluation.  The patient has complaints of shortness  of breath, productive cough greater than one week.  The patient called Dr.  Jetty Duhamel and told him of these symptoms, and the patient was given an  outpatient antibiotic, Z-Pak.  Despite this medication, the patient has not  experienced any improvement in her symptoms.  She still complains of cough  occasionally productive of colored sputum, occasional fever, as well as  shortness of breath, decreased appetite, and just general malaise.  In the  ED the patient was evaluated and found to be hemodynamically stable.  CBC  within normal limits.  X-ray showing  left lower lobe infiltrate versus  atelectasis.  [facility] Hospitalist was called for further evaluation and  treatment.  At the time of my evaluation the patient was alert and oriented,  in no apparent distress, with the above complaints.  Admission is deemed  necessary for further evaluation and treatment of left lower lobe pneumonia  failing outpatient treatment.   PAST MEDICAL HISTORY:  1. Hypertension.  2. Asthma/bronchitis.  3. Depression.  4. Questionable GERD.  5. History of diverticulitis requiring right colectomy in 2001.   MEDICATIONS ON ADMISSION:  The patient is unsure of her medications, but per  ER sheet suggests:   1. Trazodone 1-1/2 tablets q.h.s.  2.  Lexapro 10 mg daily.  3. Premarin 0.625 mg daily.  4. Singulair 10 mg daily.  5. Nexium 40 mg daily.   SOCIAL HISTORY:  Negative for tobacco, alcohol, or drugs.   PAST SURGICAL HISTORY:  Significant for right colectomy in 2001 secondary to  diverticulitis with associated abscess.  The patient denies appendectomy or  cholecystectomy.  The patient has a history of hysterectomy in the past  secondary to dysfunctional uterine bleeding at age 27.   ALLERGIES:  No known drug allergies.   FAMILY HISTORY:  The patient states that her mother was without any  significant medical problems.  Her father was deceased at the age of 8 from  a myocardial infarction, also had COPD.  The patient states she has one  brother, who is healthy, and one sister, who is deceased from a myocardial  infarction.   REVIEW OF SYSTEMS:  As stated in the HPI.  In addition,  the patient denies  any chest pain, palpitation.  No nausea or vomiting, no diarrhea, no  constipation.  No blood in stool, no blood in urine.   PHYSICAL EXAMINATION:  GENERAL:  The patient is alert and oriented x3, in  mild distress, however without accessory muscle use for respiration.  VITAL SIGNS:  Temperature 97.1, BP 124/60, pulse 88, respiratory rate of 20,  saturating 91%.  HEENT:  Anicteric sclerae, no oral lesions.  NECK:  No nodes, no JVD.  CHEST:  Increased breath sounds in the left lower lobe, otherwise moderate  air movement without wheeze.  CARDIOVASCULAR:  Regular, no S3.  ABDOMEN:  Soft and nontender, no hepatosplenomegaly.  EXTREMITIES:  No clubbing, cyanosis, or edema.  The patient does have  several small broken veins in her legs, 2+ pulse.  RECTAL:  Deferred.   LABORATORY DATA:  CBC:  White count 4.2, hemoglobin 12.2, MCV of 87,  platelets 211.  BMET:  Sodium 131, potassium 3.2, chloride 98, CO2 of 27,  BUN 13, creatinine 1.1.  AST and ALT within normal limits.  Chest x-ray:  Left lower lobe infiltrate.  ABG is  pending, which I have requested.   ASSESSMENT:  1. Left lower lobe pneumonia.  2. History of bronchitis/asthma, with recent Z-Pak use without improvement.  3. Hypertension.  4. Depression.  5. Questionable gastroesophageal reflux disease.   Recommend the patient be admitted to a medicine floor bed for evaluation and  treatment of a left lower lobe pneumonia.  The patient will receive IV  antibiotics, O2, nebulizers.  Steroids will be held at this time as the  patient is without wheezing.  Will obtain a follow-up chest x-ray post IV  fluids, obtain a sputum, Gram stain and C&S.  Of note, the patient states  she has received a flu vaccine and Pneumovax.  Will make further  recommendations after review of the above studies.                                               Deirdre Peer. Polite, M.D.    RDP/MEDQ  D:  07/11/2003  T:  07/11/2003  Job:  [id number]   cc:   Caryn Bee L. Little, M.D.  [address]  [place]  [place] [address]  Fax: [phone]

## 2010-09-07 NOTE — Op Note (Signed)
Katie Alvarado, Katie Alvarado                          ACCOUNT NO.:  [id number]   MEDICAL RECORD NO.:  [id number]                   PATIENT TYPE:  AMB   LOCATION:  DSC                                  FACILITY:  [facility]   PHYSICIAN:  Leonides Grills, M.D.                  DATE OF BIRTH:  07/23/1925   DATE OF PROCEDURE:  01/04/2003  DATE OF DISCHARGE:                                 OPERATIVE REPORT   PREOPERATIVE DIAGNOSIS:  Left superficial peroneal nerve neuroma.   POSTOPERATIVE DIAGNOSIS:  Left superficial peroneal nerve neuroma.   OPERATION PERFORMED:  Excision left superficial peroneal nerve neuroma  buried within bone.   SURGEON:  Leonides Grills, M.D.   ASSISTANT:  Lianne Cure, P.A.   ANESTHESIA:  General endotracheal tube.   ESTIMATED BLOOD LOSS:  Minimal.   TOURNIQUET TIME:  Approximately half hour.   COMPLICATIONS:  None.   DISPOSITION:  Stable to PR.   INDICATIONS FOR PROCEDURE:  The patient is a 77 year old female who  underwent an ankle arthroscopy which was complicated by an anterolateral  superficial peroneal nerve neuroma.  She was consented for the above  procedure.  All risks which include infection, neurovascular injury,  recurrence of the neuroma, persistent of pain, worsening of pain, stiffness,  were all explained, questions were encouraged and answered.   DESCRIPTION OF PROCEDURE:  The patient was brought to the operating room and  placed in supine position after adequate general endotracheal tube  anesthesia was administered as well as Ancef 1g IV piggyback.  The left  lower extremity was then prepped and draped in sterile manner over a  proximally placed thigh tourniquet with a bump placed under the ipsilateral  hip.  We gravity exsanguinated the left lower extremity and the tourniquet  was elevated to 290 mmHg and a longitudinal incision over the previous  incision was then made.  Dissection was carried down through skin.  The  neuroma was obviously  visualized.  It was part of the superficial peroneal  nerve.  The incision was extended superiorly to the tibial region.  The  nerve was neurolysed superiorly and inferiorly and then an approximately 3.5  mm drill hole was then placed into the tibia distally.  We then after the  periosteum was incised longitudinally and elevated for later closure, we  then removed the neuroma and sent this to pathology and then put the  proximal limb tip into the hole in the tibia.  We then anchored the nerve to  the periosteum with 5-0 nylon.  The wound was then copiously irrigated with  normal saline.  Tourniquet was deflated.  Hemostasis was obtained.  The  skin was closed with 4-0 nylon.  Sterile dressing was applied.  Cam walker  boot was applied.  The patient was stable to the PR.  Leonides Grills, M.D.    PB/MEDQ  D:  01/04/2003  T:  01/04/2003  Job:  [id number]

## 2010-09-07 NOTE — Op Note (Signed)
[facility]  Patient:    Katie Alvarado                         MRN: [id number] Proc. Date: 10/19/99 Adm. Date:  [id number] Attending:  Glenna Fellows Tappan Dictator:   Lorne Skeens. Hoxworth, M.D.                           Operative Report  PREOPERATIVE DIAGNOSIS:  Cecal mass/inflammatory change.  POSTOPERATIVE DIAGNOSIS:  Cecal mass/inflammatory change.  PROCEDURE:  Right colectomy.  SURGEON:  Dr. Johna Sheriff.  ANESTHESIA:  General.  HISTORY OF PRESENT ILLNESS:  Katie Alvarado is a 77 year old white female who presented about two weeks ago with acute right lower quadrant pain and tenderness.  She had been having some intermittent milder symptoms for several months.  A CT scan at that time showed diffuse thickening of the cecal wall consistent with inflammatory or malignant change.  She was initially treated with intravenous antibiotics and improved in the hospital, and was discharged on p.o. antibiotics.  Over the last several days she, however, has had worsening right lower quadrant pain, and follow up in my office revealed increasing tenderness and guarding in the right lower quadrant.  Due to clinical worsening under maximal medical management, we have elected to proceed with right colectomy.  The nature of the procedure, its indications, and risk of bleeding and infection were discussed and understood by the patient and her husband preoperatively.  She is now brought to the operating room for this procedure.  DESCRIPTION OF PROCEDURE:  The patient was brought to the operating room, placed in supine position on the operating table.  General endotracheal anesthesia was induced.  Antibiotics were given intravenously.  ______ were in place.  The abdomen was sterilely prepped and draped.  A midline incision skirting the umbilicus was used, and dissection was carried down through the subcutaneous tissue in the midline fascia using cautery.  The  peritoneum was entered under direct vision.  A thorough exploration was performed.  There were some filmy inflammatory adhesions around the cecum which were taken down with cautery.  The cecum appeared somewhat erythematous, as did the surrounding peritoneum.  The wall of the cecum felt moderately thickened, but there were no severe inflammatory or purulent changes, and no palpable mass. The mesentery appeared normal.  There was no significant adenopathy.  The remainder of the colon was normal.  Thorough exploration revealed normal small bowel, liver, gallbladder, stomach, duodenum, and retroperitoneum.  The uterus and ovaries were surgically absent.  I elected to proceed with right colectomy at this point.  The terminal ileum, cecum, and right colon were immobilized, dividing peritoneal attachments, and bringing them up out of the retroperitoneum.  The hepatic flexure was taken down between clips and mobilized.  A short portion of the omentum was dissected off the proximal transverse colon.  Points of resection of the terminal ileum and proximal transverse colon were chosen.   The mesentery between these two areas were then sequentially divided between clamps and tied with 2-0 silk ties, double tying larger vessels.  After the mesentery was divided and the bowel was cleaned of mesenteric and pericolic fat, a functional end-to-end anastomosis was created between the terminal ileum and proximal transverse colon using a single firing of the 75 mm GI stapler.  There was no bleeding from the staple line.  The enterotomy was closed, and the specimen  removed using a single firing of the TIA60 stapler.  I opened the specimen on the back table, and there was no evidence of mucosal neoplasm.  The specimen was sent for pathologic examination.  The abdomen was thoroughly irrigated with saline and inspected for hemostasis which was complete.  Gloves and instruments were changed.  The mesenteric defect  was closed using interrupted 3-0 silk sutures. The viscera returned to the anatomic position.  The midline fascia was closed using running #1 PDS.  The subcutaneous tissue was irrigated with antibiotic solution.  The skin closed with staples.  Sponge, needle, and instrument counts were correct.  Dressing was applied.  The patient was taken to recovery in good condition. DD:  10/19/99 TD:  10/20/99 Job: [id number]

## 2010-10-04 ENCOUNTER — Emergency Department (HOSPITAL_COMMUNITY)
Admission: EM | Admit: 2010-10-04 | Discharge: 2010-10-04 | Disposition: A | Discharge status: Home / Self Care | Payer: Medicare Other | Attending: Emergency Medicine | Admitting: Emergency Medicine

## 2010-10-04 DIAGNOSIS — R21 Rash and other nonspecific skin eruption: Secondary | ICD-10-CM | POA: Insufficient documentation

## 2010-10-04 DIAGNOSIS — E119 Type 2 diabetes mellitus without complications: Secondary | ICD-10-CM | POA: Insufficient documentation

## 2010-10-04 DIAGNOSIS — I10 Essential (primary) hypertension: Secondary | ICD-10-CM | POA: Insufficient documentation

## 2010-10-04 DIAGNOSIS — J449 Chronic obstructive pulmonary disease, unspecified: Secondary | ICD-10-CM | POA: Insufficient documentation

## 2010-10-04 DIAGNOSIS — J4489 Other specified chronic obstructive pulmonary disease: Secondary | ICD-10-CM | POA: Insufficient documentation

## 2010-10-15 ENCOUNTER — Other Ambulatory Visit: Payer: Self-pay | Admitting: Dermatology

## 2010-11-16 ENCOUNTER — Other Ambulatory Visit: Payer: Self-pay | Admitting: Internal Medicine

## 2010-11-17 ENCOUNTER — Other Ambulatory Visit: Payer: Self-pay | Admitting: Internal Medicine

## 2010-12-27 ENCOUNTER — Other Ambulatory Visit: Payer: Self-pay | Admitting: Internal Medicine

## 2011-04-08 ENCOUNTER — Encounter: Payer: Self-pay | Admitting: Pulmonary Disease

## 2011-04-09 ENCOUNTER — Ambulatory Visit (INDEPENDENT_AMBULATORY_CARE_PROVIDER_SITE_OTHER)
Admission: RE | Admit: 2011-04-09 | Discharge: 2011-04-09 | Disposition: A | Discharge status: Home / Self Care | Payer: Medicare Other | Source: Ambulatory Visit | Attending: Internal Medicine | Admitting: Internal Medicine

## 2011-04-09 ENCOUNTER — Encounter: Payer: Self-pay | Admitting: Internal Medicine

## 2011-04-09 ENCOUNTER — Other Ambulatory Visit (INDEPENDENT_AMBULATORY_CARE_PROVIDER_SITE_OTHER): Payer: Medicare Other

## 2011-04-09 ENCOUNTER — Ambulatory Visit (INDEPENDENT_AMBULATORY_CARE_PROVIDER_SITE_OTHER): Payer: Medicare Other | Admitting: Internal Medicine

## 2011-04-09 DIAGNOSIS — J479 Bronchiectasis, uncomplicated: Secondary | ICD-10-CM

## 2011-04-09 DIAGNOSIS — R29818 Other symptoms and signs involving the nervous system: Secondary | ICD-10-CM

## 2011-04-09 LAB — CBC WITH DIFFERENTIAL/PLATELET
Basophils Relative: 0.6 % (ref 0.0–3.0)
Eosinophils Absolute: 0 10*3/uL (ref 0.0–0.7)
Eosinophils Relative: 0 % (ref 0.0–5.0)
HCT: 42.6 % (ref 36.0–46.0)
Hemoglobin: 14.7 g/dL (ref 12.0–15.0)
MCHC: 34.5 g/dL (ref 30.0–36.0)
MCV: 90.3 fl (ref 78.0–100.0)
Monocytes Absolute: 1 10*3/uL (ref 0.1–1.0)
Neutro Abs: 13.1 10*3/uL — ABNORMAL HIGH (ref 1.4–7.7)
RBC: 4.72 Mil/uL (ref 3.87–5.11)

## 2011-04-09 LAB — BASIC METABOLIC PANEL
BUN: 37 mg/dL — ABNORMAL HIGH (ref 6–23)
CO2: 26 mEq/L (ref 19–32)
Calcium: 9.9 mg/dL (ref 8.4–10.5)
GFR: 45.13 mL/min — ABNORMAL LOW (ref >60.00)
Glucose, Bld: 342 mg/dL — ABNORMAL HIGH (ref 70–99)

## 2011-04-09 NOTE — Assessment & Plan Note (Signed)
Probably left neck pain is arthritis/ pinched nerve. Steroids helped. This will be managed by Dr Clarene Duke.

## 2011-04-09 NOTE — Progress Notes (Addendum)
04/09/11- 85 yoF never smoker, followed for bronchiectasis/ chronic bronchitis, complicated by hx of GERD, DM. LOV-May 15, 2010 Has had flu vax. We called in a Zpak for bronchitis in August - she says it helped. She complains of weakness "tired" over the past several weeks- nothing specific- but has difficulty walking around home. Was having left neck pain. Dr Haroldine Laws ENT gave antibiotic. Dr Wyline Beady gave prednisone taper, xray'd neck. She says prednisone helped her neck pain. Tried a heating pad but burned herself. Cough also improved after the prednisone.  Denies fever, but has had some sweat. Weight up, then down. No chest pain and little residual dry cough.  ROS-see HPI Constitutional:     weight loss- gained then lost, night sweats, fevers, chills, fatigue, lassitude. HEENT:   No-  headaches, difficulty swallowing, tooth/dental problems, sore throat,       No-  sneezing, itching, ear ache, nasal congestion, post nasal drip,  CV:  No-   chest pain, orthopnea, PND, swelling in lower extremities, anasarca,                                  dizziness, palpitations Resp: No-   shortness of breath with exertion or at rest.              No-   productive cough,  No non-productive cough,  No- coughing up of blood.              No-   change in color of mucus.  No- wheezing.   Skin: No-   rash or lesions. GI:  No-   heartburn, indigestion, abdominal pain, nausea, vomiting, diarrhea,                 change in bowel habits, loss of appetite GU: No-   Dysuria, MS:  No-   joint pain or swelling.  No- decreased range of motion.  No- back pain. Neuro-     nothing unusual Psych:  No- change in mood or affect. No depression or anxiety.  No memory loss.  General- Alert, Oriented, Affect-appropriate, Distress- none acute Skin- rash-none,  excoriation- none- scab over burn in front of left ear. Lymphadenopathy- none Head- atraumatic            Eyes- Gross vision intact, PERRLA, conjunctivae clear  secretions            Ears- Hearing, canals-normal            Nose- Clear, no-Septal dev, mucus, polyps, erosion, perforation             Throat- Mallampati II , mucosa very dry , drainage- none, tonsils- atrophic Neck- flexible , trachea midline, no stridor , thyroid nl, carotid no bruit Chest - symmetrical excursion , unlabored           Heart/CV- RRR , no murmur , no gallop  , no rub, nl s1 s2                           - JVD- none , edema- none, stasis changes- none, varices- none           Lung- clear to P&A trace crackle in mid-left, wheeze- none, cough- none , dullness-none, rub- none           Chest wall-  Abd- tender-no, distended-no, bowel sounds-present, HSM- no Br/ Gen/ Rectal- Not done, not  indicated Extrem- cyanosis- none, clubbing, none, atrophy- none, strength- nl Neuro- grossly intact to observation

## 2011-04-09 NOTE — Patient Instructions (Signed)
Order- CXR   Dx bronchiectasis            CBC w/ diff,   Sed rate, BMET

## 2011-04-09 NOTE — Assessment & Plan Note (Signed)
Chronic bronchiectasis and bronchitis are not active now, while she is distracted by other issues. Doubt her chest is the source of problems causing her current c/o weakness. She is weaning down steroids and may have sense of tiredness from that. Plan- update CXR, BMET,sed rate CBC.

## 2011-04-10 ENCOUNTER — Other Ambulatory Visit: Payer: Self-pay

## 2011-04-10 ENCOUNTER — Encounter (HOSPITAL_COMMUNITY): Payer: Self-pay | Admitting: Emergency Medicine

## 2011-04-10 ENCOUNTER — Inpatient Hospital Stay (HOSPITAL_COMMUNITY)
Admission: EM | Admit: 2011-04-10 | Discharge: 2011-04-12 | DRG: 948 | Disposition: A | Discharge status: Home / Self Care | Payer: Medicare Other | Source: Ambulatory Visit | Attending: Internal Medicine | Admitting: Internal Medicine

## 2011-04-10 ENCOUNTER — Emergency Department (HOSPITAL_COMMUNITY): Payer: Medicare Other

## 2011-04-10 DIAGNOSIS — N179 Acute kidney failure, unspecified: Secondary | ICD-10-CM | POA: Diagnosis present

## 2011-04-10 DIAGNOSIS — K219 Gastro-esophageal reflux disease without esophagitis: Secondary | ICD-10-CM

## 2011-04-10 DIAGNOSIS — J471 Bronchiectasis with (acute) exacerbation: Secondary | ICD-10-CM

## 2011-04-10 DIAGNOSIS — E871 Hypo-osmolality and hyponatremia: Secondary | ICD-10-CM | POA: Diagnosis present

## 2011-04-10 DIAGNOSIS — I959 Hypotension, unspecified: Secondary | ICD-10-CM | POA: Diagnosis present

## 2011-04-10 DIAGNOSIS — R42 Dizziness and giddiness: Secondary | ICD-10-CM

## 2011-04-10 DIAGNOSIS — M353 Polymyalgia rheumatica: Secondary | ICD-10-CM | POA: Diagnosis present

## 2011-04-10 DIAGNOSIS — J479 Bronchiectasis, uncomplicated: Secondary | ICD-10-CM

## 2011-04-10 DIAGNOSIS — R05 Cough: Secondary | ICD-10-CM

## 2011-04-10 DIAGNOSIS — E860 Dehydration: Secondary | ICD-10-CM

## 2011-04-10 DIAGNOSIS — R059 Cough, unspecified: Secondary | ICD-10-CM | POA: Diagnosis present

## 2011-04-10 DIAGNOSIS — N390 Urinary tract infection, site not specified: Secondary | ICD-10-CM | POA: Diagnosis present

## 2011-04-10 DIAGNOSIS — J449 Chronic obstructive pulmonary disease, unspecified: Secondary | ICD-10-CM | POA: Diagnosis present

## 2011-04-10 DIAGNOSIS — IMO0002 Reserved for concepts with insufficient information to code with codable children: Secondary | ICD-10-CM

## 2011-04-10 DIAGNOSIS — R531 Weakness: Secondary | ICD-10-CM | POA: Diagnosis present

## 2011-04-10 DIAGNOSIS — R5383 Other fatigue: Principal | ICD-10-CM | POA: Diagnosis present

## 2011-04-10 DIAGNOSIS — E119 Type 2 diabetes mellitus without complications: Secondary | ICD-10-CM | POA: Diagnosis present

## 2011-04-10 DIAGNOSIS — N289 Disorder of kidney and ureter, unspecified: Secondary | ICD-10-CM

## 2011-04-10 DIAGNOSIS — D72829 Elevated white blood cell count, unspecified: Secondary | ICD-10-CM

## 2011-04-10 DIAGNOSIS — G4700 Insomnia, unspecified: Secondary | ICD-10-CM | POA: Diagnosis present

## 2011-04-10 DIAGNOSIS — J00 Acute nasopharyngitis [common cold]: Secondary | ICD-10-CM

## 2011-04-10 DIAGNOSIS — R5381 Other malaise: Principal | ICD-10-CM | POA: Diagnosis present

## 2011-04-10 DIAGNOSIS — Z79899 Other long term (current) drug therapy: Secondary | ICD-10-CM

## 2011-04-10 DIAGNOSIS — R29818 Other symptoms and signs involving the nervous system: Secondary | ICD-10-CM

## 2011-04-10 DIAGNOSIS — Z7982 Long term (current) use of aspirin: Secondary | ICD-10-CM

## 2011-04-10 DIAGNOSIS — J4489 Other specified chronic obstructive pulmonary disease: Secondary | ICD-10-CM | POA: Diagnosis present

## 2011-04-10 LAB — COMPREHENSIVE METABOLIC PANEL
AST: 11 U/L (ref 0–37)
Albumin: 3.8 g/dL (ref 3.5–5.2)
BUN: 57 mg/dL — ABNORMAL HIGH (ref 6–23)
Calcium: 10.2 mg/dL (ref 8.4–10.5)
Creatinine, Ser: 1.69 mg/dL — ABNORMAL HIGH (ref 0.50–1.10)
Total Protein: 7.3 g/dL (ref 6.0–8.3)

## 2011-04-10 LAB — CARDIAC PANEL(CRET KIN+CKTOT+MB+TROPI)
Relative Index: INVALID (ref 0.0–2.5)
Troponin I: <0.3 ng/mL (ref <0.30)

## 2011-04-10 LAB — CBC
HCT: 39.7 % (ref 36.0–46.0)
Hemoglobin: 14.3 g/dL (ref 12.0–15.0)
MCH: 30.6 pg (ref 26.0–34.0)
MCV: 85 fL (ref 78.0–100.0)
Platelets: 416 10*3/uL — ABNORMAL HIGH (ref 150–400)
RBC: 4.67 MIL/uL (ref 3.87–5.11)
WBC: 19.6 10*3/uL — ABNORMAL HIGH (ref 4.0–10.5)

## 2011-04-10 LAB — URINALYSIS, ROUTINE W REFLEX MICROSCOPIC
Bilirubin Urine: NEGATIVE
Ketones, ur: NEGATIVE mg/dL
Nitrite: NEGATIVE
Urobilinogen, UA: 0.2 mg/dL (ref 0.0–1.0)
pH: 5.5 (ref 5.0–8.0)

## 2011-04-10 LAB — URINE MICROSCOPIC-ADD ON

## 2011-04-10 MED ORDER — SODIUM CHLORIDE 0.9 % IV BOLUS (SEPSIS)
500.0000 mL | Freq: Once | INTRAVENOUS | Status: AC
  Administered 2011-04-10: 500 mL via INTRAVENOUS

## 2011-04-10 MED ORDER — ONDANSETRON HCL 4 MG/2ML IJ SOLN
4.0000 mg | Freq: Once | INTRAMUSCULAR | Status: AC
  Administered 2011-04-10: 4 mg via INTRAVENOUS
  Filled 2011-04-10: qty 2

## 2011-04-10 NOTE — ED Provider Notes (Addendum)
History     CSN: [id number] Arrival date & time: 04/10/2011  8:18 PM   First MD Initiated Contact with Patient 04/10/11 2105      Chief Complaint  Patient presents with  . Abnormal Lab    (Consider location/radiation/quality/duration/timing/severity/associated sxs/prior treatment) The history is provided by the patient and the spouse.  pt generally weak for past months, slowly worse. States has seen pcp and her pulmonary doctor for same. Hx chronic bronchitis. Also states being treated with prednisone for 'bone spurs in neck'. C/o nausea, poor appetite. No abd pain. No nv. No chest discomfort. Denies sob. Non prod cough. No sore throat, nasal congestion, fevers or other uri c/o. No headaches.  Pt states hx recurrent utis, just complete rx cipro. States had labs done by her doctor and was called today and told to come in due to elevated kidney tests and low sodium. No acute or abrupt change in symptoms today. No chills/sweats. No other recent change in meds.   Past Medical History  Diagnosis Date  . Diabetes mellitus   . Other symptoms involving nervous and musculoskeletal systems   . Insomnia, unspecified   . Cough   . Bronchiectasis with acute exacerbation   . Acute nasopharyngitis (common cold)   . Esophageal reflux     Past Surgical History  Procedure Date  . Foot   . Abdominal hysterectomy   . Colon surgery     Family History  Problem Relation Age of Onset  . Chronic bronchitis Father   . Other Sister     heart trouble  . Other Brother     heart trouble    History  Substance Use Topics  . Smoking status: Never Smoker   . Smokeless tobacco: Not on file  . Alcohol Use: No    OB History    Grav Para Term Preterm Abortions TAB SAB Ect Mult Living                  Review of Systems  Constitutional: Negative for fever and chills.  HENT: Negative for congestion, rhinorrhea and neck stiffness.   Eyes: Negative for redness and visual disturbance.  Respiratory:  Negative for shortness of breath.   Cardiovascular: Negative for chest pain, palpitations and leg swelling.  Gastrointestinal: Negative for abdominal pain.  Genitourinary: Negative for dysuria and flank pain.  Musculoskeletal: Negative for back pain.  Skin: Negative for rash.  Neurological: Negative for numbness and headaches.  Hematological: Does not bruise/bleed easily.  Psychiatric/Behavioral: Negative for confusion.    Allergies  Welchol; Glucosamine; and Celecoxib  Home Medications   Current Outpatient Rx  Name Route Sig Dispense Refill  . ALBUTEROL SULFATE HFA 108 (90 BASE) MCG/ACT IN AERS Inhalation Inhale 2 puffs into the lungs every 4 (four) hours as needed. For shortness of breath.     . ALLOPURINOL 100 MG PO TABS Oral Take 100 mg by mouth daily.      Marland Kitchen ALPRAZOLAM 0.25 MG PO TABS Oral Take 0.25 mg by mouth 3 (three) times daily as needed. For anxiety.    . ASPIRIN 81 MG PO TABS Oral Take 81 mg by mouth daily.      Marland Kitchen VITAMIN D 1000 UNITS PO TABS Oral Take 2,000 Units by mouth daily.      Marland Kitchen CIPROFLOXACIN HCL 250 MG PO TABS Oral Take 250 mg by mouth 2 (two) times daily.      Marland Kitchen ESOMEPRAZOLE MAGNESIUM 40 MG PO CPDR Oral Take 40 mg by mouth  daily.      Marland Kitchen ESTROGENS CONJUGATED 0.625 MG PO TABS Oral Take 0.625 mg by mouth daily. Take daily for 21 days then do not take for 7 days.     . IPRATROPIUM BROMIDE 0.02 % IN SOLN Inhalation Inhale 500 mcg into the lungs 4 (four) times daily.      Marland Kitchen LOSARTAN POTASSIUM 25 MG PO TABS Oral Take 25 mg by mouth daily.      Marland Kitchen MECLIZINE HCL 25 MG PO TABS Oral Take 25 mg by mouth 3 (three) times daily as needed. For dizziness.    . METFORMIN HCL 500 MG PO TABS Oral Take 500 mg by mouth 2 (two) times daily.      Marland Kitchen PREDNISONE 10 MG PO TABS Oral Take 40 mg by mouth daily.      Marland Kitchen PROBIOTIC FORMULA PO Oral Take 1 capsule by mouth daily.      Marland Kitchen SOLIFENACIN SUCCINATE 5 MG PO TABS Oral Take 10 mg by mouth daily.      . TRAMADOL HCL 50 MG PO TABS Oral Take 50  mg by mouth every 8 (eight) hours as needed. For pain. Maximum dose= 8 tablets per day    . TRIAMTERENE-HCTZ 75-50 MG PO TABS Oral Take 1 tablet by mouth daily.        BP 108/71  Pulse 86  Temp(Src) 98.9 F (37.2 C) (Oral)  Resp 16  SpO2 96%  Physical Exam  Nursing note and vitals reviewed. Constitutional: She is oriented to person, place, and time. She appears well-developed and well-nourished. No distress.  HENT:  Head: Atraumatic.  Eyes: Conjunctivae are normal. Pupils are equal, round, and reactive to light. No scleral icterus.  Neck: Normal range of motion. Neck supple. No tracheal deviation present. No thyromegaly present.       No stiffness or rigidity  Cardiovascular: Normal rate, regular rhythm, normal heart sounds and intact distal pulses.  Exam reveals no gallop and no friction rub.   No murmur heard. Pulmonary/Chest: Effort normal and breath sounds normal. No respiratory distress.  Abdominal: Soft. Normal appearance and bowel sounds are normal. She exhibits no distension and no mass. There is no tenderness. There is no rebound and no guarding.  Genitourinary:       No cva tenderness  Musculoskeletal: She exhibits no edema and no tenderness.  Neurological: She is alert and oriented to person, place, and time.       Motor intact bil.   Skin: Skin is warm and dry. No rash noted.  Psychiatric: She has a normal mood and affect.    ED Course  Procedures (including critical care time)   Labs Reviewed  URINALYSIS, ROUTINE W REFLEX MICROSCOPIC  COMPREHENSIVE METABOLIC PANEL  CBC  CARDIAC PANEL(CRET KIN+CKTOT+MB+TROPI)   Dg Chest 2 View  04/09/2011  *RADIOLOGY REPORT*  Clinical Data: Shortness of breath, cough, chest pain  CHEST - 2 VIEW  Comparison: 02/06/2010 and earlier studies  Findings: Patchy streaky and nodular opacities peripherally in the right upper lobe and lingula, without interval change.  Heart size upper limits normal.  Tortuous atheromatous thoracic  aorta.  No effusion.  Regional bones unremarkable.  Mild hyperinflation.  IMPRESSION:  Stable patchy   parenchymal opacities as before.  No acute or superimposed abnormality.  Original Report Authenticated By: Osa Craver, M.D.    Results for orders placed during the hospital encounter of 04/10/11  URINALYSIS, ROUTINE W REFLEX MICROSCOPIC      Component Value Range  Color, Urine YELLOW  YELLOW    APPearance CLEAR  CLEAR    Specific Gravity, Urine 1.016  1.005 - 1.030    pH 5.5  5.0 - 8.0    Glucose, UA NEGATIVE  NEGATIVE (mg/dL)   Hgb urine dipstick NEGATIVE  NEGATIVE    Bilirubin Urine NEGATIVE  NEGATIVE    Ketones, ur NEGATIVE  NEGATIVE (mg/dL)   Protein, ur NEGATIVE  NEGATIVE (mg/dL)   Urobilinogen, UA 0.2  0.0 - 1.0 (mg/dL)   Nitrite NEGATIVE  NEGATIVE    Leukocytes, UA SMALL (*) NEGATIVE   COMPREHENSIVE METABOLIC PANEL      Component Value Range   Sodium 126 (*) 135 - 145 (mEq/L)   Potassium 3.7  3.5 - 5.1 (mEq/L)   Chloride 89 (*) 96 - 112 (mEq/L)   CO2 23  19 - 32 (mEq/L)   Glucose, Bld 129 (*) 70 - 99 (mg/dL)   BUN 57 (*) 6 - 23 (mg/dL)   Creatinine, Ser 9.14 (*) 0.50 - 1.10 (mg/dL)   Calcium 78.2  8.4 - 10.5 (mg/dL)   Total Protein 7.3  6.0 - 8.3 (g/dL)   Albumin 3.8  3.5 - 5.2 (g/dL)   AST 11  0 - 37 (U/L)   ALT 15  0 - 35 (U/L)   Alkaline Phosphatase 58  39 - 117 (U/L)   Total Bilirubin 0.3  0.3 - 1.2 (mg/dL)   GFR calc non Af Amer 27 (*) >90 (mL/min)   GFR calc Af Amer 31 (*) >90 (mL/min)  CBC      Component Value Range   WBC 19.6 (*) 4.0 - 10.5 (K/uL)   RBC 4.67  3.87 - 5.11 (MIL/uL)   Hemoglobin 14.3  12.0 - 15.0 (g/dL)   HCT 95.6  21.3 - 08.6 (%)   MCV 85.0  78.0 - 100.0 (fL)   MCH 30.6  26.0 - 34.0 (pg)   MCHC 36.0  30.0 - 36.0 (g/dL)   RDW 57.8  46.9 - 62.9 (%)   Platelets 416 (*) 150 - 400 (K/uL)  CARDIAC PANEL(CRET KIN+CKTOT+MB+TROPI)      Component Value Range   Total CK 31  7 - 177 (U/L)   CK, MB 2.7  0.3 - 4.0 (ng/mL)   Troponin I  <0.30  <0.30 (ng/mL)   Relative Index RELATIVE INDEX IS INVALID  0.0 - 2.5   URINE MICROSCOPIC-ADD ON      Component Value Range   Squamous Epithelial / LPF FEW (*) RARE    WBC, UA 3-6  <3 (WBC/hpf)   RBC / HPF 0-2  <3 (RBC/hpf)   Bacteria, UA RARE  RARE    Casts HYALINE CASTS (*) NEGATIVE    Dg Chest 2 View  04/10/2011  *RADIOLOGY REPORT*  Clinical Data: Cough, congestion, weakness  CHEST - 2 VIEW  Comparison: 04/09/2011  Findings: Stable mild patchy opacities/scarring in the right upper lobe and lingula/left lower lobe. No pleural effusion or pneumothorax.  Cardiomediastinal silhouette is within normal limits.  Degenerative changes of the visualized thoracolumbar spine.  IMPRESSION: Stable mild patchy opacities/scarring in the right upper lobe and lingula/left lower lobe, chronic.  Original Report Authenticated By: Charline Bills, M.D.   Dg Chest 2 View  04/09/2011  *RADIOLOGY REPORT*  Clinical Data: Shortness of breath, cough, chest pain  CHEST - 2 VIEW  Comparison: 02/06/2010 and earlier studies  Findings: Patchy streaky and nodular opacities peripherally in the right upper lobe and lingula, without interval change.  Heart size upper  limits normal.  Tortuous atheromatous thoracic aorta.  No effusion.  Regional bones unremarkable.  Mild hyperinflation.  IMPRESSION:  Stable patchy   parenchymal opacities as before.  No acute or superimposed abnormality.  Original Report Authenticated By: Thora Lance III, M.D.        MDM  Iv ns bolus. Labs. Xray. Ua.    Date: 04/10/2011  Rate: 66  Rhythm: normal sinus rhythm  QRS Axis: normal  Intervals: normal  ST/T Wave abnormalities: normal  Conduction Disutrbances:none  Narrative Interpretation:   Old EKG Reviewed: unchanged  cxr unchanged from prior. New renal insuff. ua w few wbc, le pos, will culture. Given poor po intake, dehydration, inc renal fxn, med service called to admit.  Wbc elev, ?occult infxn, ?uti ? Resp, ?disciitis.  Pt also was recently placed on prednisone as well.  Will need admit, f/u urine culture. Pt denies worsening cough or sob. May need mr c/upper tspine in am. Currently no focal spine tenderness/pain, no radicular pain, no numbness/weakness.   Dr Mikeal Hawthorne says admit to triad team 2.   Suzi Roots, MD 04/10/11 2257  Suzi Roots, MD 04/10/11 2317  Suzi Roots, MD 04/10/11 [phone]

## 2011-04-10 NOTE — ED Notes (Signed)
Pt st's she has been weak for approx 4 weeks.  Has been going to her MD's office.  Today was called by MD and told to come to ED ref. Elevated sodium level. And abnormal renal function test.

## 2011-04-10 NOTE — ED Notes (Signed)
Pt complained of pain upon insertion, pt states now she feels better, clear yellow urine returned

## 2011-04-11 ENCOUNTER — Encounter (HOSPITAL_COMMUNITY): Payer: Self-pay | Admitting: Internal Medicine

## 2011-04-11 DIAGNOSIS — R531 Weakness: Secondary | ICD-10-CM | POA: Diagnosis present

## 2011-04-11 DIAGNOSIS — E860 Dehydration: Secondary | ICD-10-CM

## 2011-04-11 DIAGNOSIS — N179 Acute kidney failure, unspecified: Secondary | ICD-10-CM | POA: Diagnosis present

## 2011-04-11 DIAGNOSIS — E871 Hypo-osmolality and hyponatremia: Secondary | ICD-10-CM | POA: Diagnosis present

## 2011-04-11 DIAGNOSIS — N390 Urinary tract infection, site not specified: Secondary | ICD-10-CM | POA: Diagnosis present

## 2011-04-11 DIAGNOSIS — I959 Hypotension, unspecified: Secondary | ICD-10-CM | POA: Diagnosis present

## 2011-04-11 DIAGNOSIS — D72829 Elevated white blood cell count, unspecified: Secondary | ICD-10-CM

## 2011-04-11 LAB — COMPREHENSIVE METABOLIC PANEL
ALT: 14 U/L (ref 0–35)
Albumin: 3.4 g/dL — ABNORMAL LOW (ref 3.5–5.2)
Alkaline Phosphatase: 51 U/L (ref 39–117)
BUN: 53 mg/dL — ABNORMAL HIGH (ref 6–23)
Calcium: 9.4 mg/dL (ref 8.4–10.5)
GFR calc Af Amer: 37 mL/min — ABNORMAL LOW (ref >90)
Glucose, Bld: 148 mg/dL — ABNORMAL HIGH (ref 70–99)
Potassium: 3.9 mEq/L (ref 3.5–5.1)
Sodium: 128 mEq/L — ABNORMAL LOW (ref 135–145)
Total Protein: 6.5 g/dL (ref 6.0–8.3)

## 2011-04-11 LAB — CBC
HCT: 38.8 % (ref 36.0–46.0)
Hemoglobin: 13.3 g/dL (ref 12.0–15.0)
Hemoglobin: 13.4 g/dL (ref 12.0–15.0)
MCV: 86.2 fL (ref 78.0–100.0)
Platelets: 351 10*3/uL (ref 150–400)
RBC: 4.46 MIL/uL (ref 3.87–5.11)
RDW: 12.9 % (ref 11.5–15.5)
WBC: 13.9 10*3/uL — ABNORMAL HIGH (ref 4.0–10.5)

## 2011-04-11 LAB — URINE CULTURE: Culture: NO GROWTH

## 2011-04-11 LAB — TSH: TSH: 7.326 u[IU]/mL — ABNORMAL HIGH (ref 0.350–4.500)

## 2011-04-11 LAB — GLUCOSE, CAPILLARY: Glucose-Capillary: 383 mg/dL — ABNORMAL HIGH (ref 70–99)

## 2011-04-11 LAB — HEMOGLOBIN A1C: Hgb A1c MFr Bld: 8.3 % — ABNORMAL HIGH (ref <5.7)

## 2011-04-11 MED ORDER — IPRATROPIUM BROMIDE 0.02 % IN SOLN
RESPIRATORY_TRACT | Status: AC
  Filled 2011-04-11: qty 2.5

## 2011-04-11 MED ORDER — POTASSIUM CHLORIDE IN NACL 20-0.9 MEQ/L-% IV SOLN
INTRAVENOUS | Status: DC
  Administered 2011-04-11 – 2011-04-12 (×3): via INTRAVENOUS
  Filled 2011-04-11 (×4): qty 1000

## 2011-04-11 MED ORDER — IPRATROPIUM BROMIDE 0.02 % IN SOLN: 0.5000 mg | Freq: Four times a day (QID) | RESPIRATORY_TRACT | Status: DC

## 2011-04-11 MED ORDER — INSULIN ASPART 100 UNIT/ML ~~LOC~~ SOLN
0.0000 [IU] | Freq: Once | SUBCUTANEOUS | Status: AC
  Administered 2011-04-11: 15 [IU] via SUBCUTANEOUS

## 2011-04-11 MED ORDER — IPRATROPIUM BROMIDE 0.02 % IN SOLN
0.5000 mg | Freq: Four times a day (QID) | RESPIRATORY_TRACT | Status: DC
  Administered 2011-04-11 – 2011-04-12 (×5): 0.5 mg via RESPIRATORY_TRACT
  Filled 2011-04-11 (×6): qty 2.5

## 2011-04-11 MED ORDER — PREDNISONE 20 MG PO TABS
40.0000 mg | ORAL_TABLET | Freq: Every day | ORAL | Status: DC
Start: 2011-04-11 — End: 2011-04-12
  Administered 2011-04-11 – 2011-04-12 (×2): 40 mg via ORAL
  Filled 2011-04-11 (×2): qty 2

## 2011-04-11 MED ORDER — DEXTROSE 5 % IV SOLN
1.0000 g | INTRAVENOUS | Status: DC
  Administered 2011-04-11 – 2011-04-12 (×2): 1 g via INTRAVENOUS
  Filled 2011-04-11 (×3): qty 10

## 2011-04-11 MED ORDER — ACETAMINOPHEN 650 MG RE SUPP: 650.0000 mg | Freq: Four times a day (QID) | RECTAL | Status: DC | PRN

## 2011-04-11 MED ORDER — PANTOPRAZOLE SODIUM 40 MG PO TBEC
40.0000 mg | DELAYED_RELEASE_TABLET | Freq: Every day | ORAL | Status: DC
  Administered 2011-04-11 – 2011-04-12 (×2): 40 mg via ORAL
  Filled 2011-04-11 (×2): qty 1

## 2011-04-11 MED ORDER — ALBUTEROL SULFATE (5 MG/ML) 0.5% IN NEBU: 2.5000 mg | INHALATION_SOLUTION | Freq: Four times a day (QID) | RESPIRATORY_TRACT | Status: DC

## 2011-04-11 MED ORDER — ALPRAZOLAM 0.25 MG PO TABS
0.2500 mg | ORAL_TABLET | Freq: Three times a day (TID) | ORAL | Status: DC | PRN
  Administered 2011-04-11 (×2): 0.25 mg via ORAL
  Filled 2011-04-11 (×2): qty 1

## 2011-04-11 MED ORDER — ONDANSETRON HCL 4 MG PO TABS: 4.0000 mg | ORAL_TABLET | Freq: Four times a day (QID) | ORAL | Status: DC | PRN

## 2011-04-11 MED ORDER — INSULIN ASPART 100 UNIT/ML ~~LOC~~ SOLN
0.0000 [IU] | Freq: Three times a day (TID) | SUBCUTANEOUS | Status: DC
  Administered 2011-04-12: 10 [IU] via SUBCUTANEOUS
  Filled 2011-04-11 (×2): qty 3

## 2011-04-11 MED ORDER — ENOXAPARIN SODIUM 40 MG/0.4ML ~~LOC~~ SOLN
40.0000 mg | SUBCUTANEOUS | Status: DC
  Administered 2011-04-11 – 2011-04-12 (×2): 40 mg via SUBCUTANEOUS
  Filled 2011-04-11 (×2): qty 0.4

## 2011-04-11 MED ORDER — ALBUTEROL SULFATE (5 MG/ML) 0.5% IN NEBU
INHALATION_SOLUTION | RESPIRATORY_TRACT | Status: AC
  Filled 2011-04-11: qty 0.5

## 2011-04-11 MED ORDER — IPRATROPIUM BROMIDE 0.02 % IN SOLN: 500.0000 ug | Freq: Four times a day (QID) | RESPIRATORY_TRACT | Status: DC

## 2011-04-11 MED ORDER — SENNA 8.6 MG PO TABS
1.0000 | ORAL_TABLET | Freq: Two times a day (BID) | ORAL | Status: DC
  Administered 2011-04-11 – 2011-04-12 (×4): 8.6 mg via ORAL
  Filled 2011-04-11 (×5): qty 1

## 2011-04-11 MED ORDER — ONDANSETRON HCL 4 MG/2ML IJ SOLN: 4.0000 mg | Freq: Four times a day (QID) | INTRAMUSCULAR | Status: DC | PRN

## 2011-04-11 MED ORDER — VITAMIN D3 25 MCG (1000 UNIT) PO TABS
2000.0000 [IU] | ORAL_TABLET | Freq: Every day | ORAL | Status: DC
  Administered 2011-04-11 – 2011-04-12 (×2): 2000 [IU] via ORAL
  Filled 2011-04-11 (×2): qty 2

## 2011-04-11 MED ORDER — ALBUTEROL SULFATE (5 MG/ML) 0.5% IN NEBU
2.5000 mg | INHALATION_SOLUTION | Freq: Four times a day (QID) | RESPIRATORY_TRACT | Status: DC
  Administered 2011-04-11 – 2011-04-12 (×5): 2.5 mg via RESPIRATORY_TRACT
  Filled 2011-04-11 (×6): qty 0.5

## 2011-04-11 MED ORDER — ACETAMINOPHEN 325 MG PO TABS
650.0000 mg | ORAL_TABLET | Freq: Four times a day (QID) | ORAL | Status: DC | PRN
  Administered 2011-04-11 – 2011-04-12 (×3): 650 mg via ORAL
  Filled 2011-04-11 (×3): qty 2

## 2011-04-11 MED ORDER — ALBUTEROL SULFATE (5 MG/ML) 0.5% IN NEBU: 2.5000 mg | INHALATION_SOLUTION | RESPIRATORY_TRACT | Status: DC | PRN

## 2011-04-11 MED ORDER — ALBUTEROL SULFATE HFA 108 (90 BASE) MCG/ACT IN AERS
2.0000 | INHALATION_SPRAY | RESPIRATORY_TRACT | Status: DC | PRN
  Filled 2011-04-11: qty 6.7

## 2011-04-11 NOTE — Progress Notes (Signed)
PATIENT DETAILS Name: Katie Alvarado Age: 77 y.o. Sex: female Date of Birth: 02/27/1926 Admit Date: 04/10/2011 PCP:No primary provider on file.  Subjective: Essentially unchanged since admission. Claims she has been weak for the past few weeks.  Objective: Vital signs in last 24 hours: Filed Vitals:   04/11/11 0651 04/11/11 0805 04/11/11 1303 04/11/11 1632  BP: 137/77  110/67   Pulse: 65  75   Temp: 97.5 F (36.4 C)  97.3 F (36.3 C)   TempSrc: Oral  Oral   Resp: 19  20   Height:      Weight:      SpO2: 95% 95% 94% 95%    Weight change:   Body mass index is 26.00 kg/(m^2).  Intake/Output from previous day:  Intake/Output Summary (Last 24 hours) at 04/11/11 1743 Last data filed at 04/11/11 1300  Gross per 24 hour  Intake    890 ml  Output      2 ml  Net    888 ml    PHYSICAL EXAM: Gen Exam: Awake and alert with clear speech.   Neck: Supple, No JVD.   Chest: B/L Clear.   CVS: S1 S2 Regular, no murmurs.  Abdomen: soft, BS +, non tender, non distended.  Extremities: no edema, lower extremities warm to touch. Neurologic: Non Focal.   Skin: No Rash.   Wounds: N/A.    CONSULTS:  none  LAB RESULTS: CBC  Lab 04/11/11 0231 04/11/11 0230 04/10/11 2151 04/09/11 0940  WBC 13.9* 13.2* 19.6* 15.7*  HGB 13.3 13.4 14.3 14.7  HCT 38.8 38.4 39.7 42.6  PLT 362 351 416* 512.0*  MCV 86.2 86.1 85.0 90.3  MCH 29.6 30.0 30.6 --  MCHC 34.3 34.9 36.0 34.5  RDW 12.9 12.9 12.8 13.6  LYMPHSABS -- -- -- 1.6  MONOABS -- -- -- 1.0  EOSABS -- -- -- 0.0  BASOSABS -- -- -- 0.1  BANDABS -- -- -- --    Chemistries   Lab 04/11/11 0231 04/10/11 2151 04/09/11 0940  NA 128* 126* 127*  K 3.9 3.7 4.3  CL 91* 89* 90*  CO2 25 23 26   GLUCOSE 148* 129* 342*  BUN 53* 57* 37*  CREATININE 1.47* 1.69* 1.2  CALCIUM 9.4 10.2 9.9  MG -- -- --    GFR Estimated Creatinine Clearance: 30.7 ml/min (by C-G formula based on Cr of 1.47).  Coagulation profile No results found for this  basename: INR:5,PROTIME:5 in the last 168 hours  Cardiac Enzymes  Lab 04/10/11 2152  CKMB 2.7  TROPONINI <0.30  MYOGLOBIN --    No components found with this basename: POCBNP:3 No results found for this basename: DDIMER:2 in the last 72 hours  Basename 04/11/11 0230  HGBA1C 8.3*   No results found for this basename: CHOL:2,HDL:2,LDLCALC:2,TRIG:2,CHOLHDL:2,LDLDIRECT:2 in the last 72 hours  Basename 04/11/11 0230  TSH 7.326*  T4TOTAL --  T3FREE --  THYROIDAB --   No results found for this basename: VITAMINB12:2,FOLATE:2,FERRITIN:2,TIBC:2,IRON:2,RETICCTPCT:2 in the last 72 hours No results found for this basename: LIPASE:2,AMYLASE:2 in the last 72 hours  Urine Studies No results found for this basename: UACOL:2,UAPR:2,USPG:2,UPH:2,UTP:2,UGL:2,UKET:2,UBIL:2,UHGB:2,UNIT:2,UROB:2,ULEU:2,UEPI:2,UWBC:2,URBC:2,UBAC:2,CAST:2,CRYS:2,UCOM:2,BILUA:2 in the last 72 hours  MICROBIOLOGY: No results found for this or any previous visit (from the past 240 hour(s)).  RADIOLOGY STUDIES/RESULTS: Dg Chest 2 View  04/10/2011  *RADIOLOGY REPORT*  Clinical Data: Cough, congestion, weakness  CHEST - 2 VIEW  Comparison: 04/09/2011  Findings: Stable mild patchy opacities/scarring in the right upper lobe and lingula/left lower lobe. No pleural  effusion or pneumothorax.  Cardiomediastinal silhouette is within normal limits.  Degenerative changes of the visualized thoracolumbar spine.  IMPRESSION: Stable mild patchy opacities/scarring in the right upper lobe and lingula/left lower lobe, chronic.  Original Report Authenticated By: Charline Bills, M.D.   Dg Chest 2 View  04/09/2011  *RADIOLOGY REPORT*  Clinical Data: Shortness of breath, cough, chest pain  CHEST - 2 VIEW  Comparison: 02/06/2010 and earlier studies  Findings: Patchy streaky and nodular opacities peripherally in the right upper lobe and lingula, without interval change.  Heart size upper limits normal.  Tortuous atheromatous thoracic aorta.   No effusion.  Regional bones unremarkable.  Mild hyperinflation.  IMPRESSION:  Stable patchy   parenchymal opacities as before.  No acute or superimposed abnormality.  Original Report Authenticated By: Thora Lance III, M.D.    MEDICATIONS: Scheduled Meds:   . albuterol  2.5 mg Nebulization QID  . cefTRIAXone (ROCEPHIN)  IV  1 g Intravenous Q24H  . cholecalciferol  2,000 Units Oral Daily  . enoxaparin  40 mg Subcutaneous Q24H  . ipratropium  0.5 mg Nebulization QID  . ondansetron (ZOFRAN) IV  4 mg Intravenous Once  . pantoprazole  40 mg Oral Daily  . predniSONE  40 mg Oral Daily  . senna  1 tablet Oral BID  . sodium chloride  500 mL Intravenous Once  . DISCONTD: albuterol  2.5 mg Nebulization Q6H  . DISCONTD: ipratropium  0.5 mg Nebulization Q6H  . DISCONTD: ipratropium  500 mcg Inhalation QID   Continuous Infusions:   . 0.9 % NaCl with KCl 20 mEq / L 100 mL/hr at 04/11/11 1407   PRN Meds:.acetaminophen, acetaminophen, albuterol, albuterol, ALPRAZolam, ondansetron (ZOFRAN) IV, ondansetron  Antibiotics: Anti-infectives     Start     Dose/Rate Route Frequency Ordered Stop   04/11/11 0230   cefTRIAXone (ROCEPHIN) 1 g in dextrose 5 % 50 mL IVPB        1 g 100 mL/hr over 30 Minutes Intravenous Every 24 hours 04/11/11 0204            Assessment/Plan: Patient Active Hospital Problem List: Weakness    Assessment: Known exactly whether she is suffering from weakness from UTI or if she has underlying polymyalgia rheumatica.    Plan: Continue with Rocephin, will check ESR. We'll continue prednisone. Await physical therapy evaluation. Patient has a appointment scheduled with Dr. Zenovia Jordan.   DM    Assessment: Will check HbA1c.    Plan: Will add sensitive sliding scale and order for CBGs to be checked. Further adjustments will be done depending on CBG readings. Hold metformin while in the hospital.   Hyponatremia    Assessment: Very secondary to dehydration   Plan:  Gently hydrate and recheck the levels in the morning.    (lower urinary tract infection)    Assessment: Afebrile, leukocytosis likely secondary to steroids.    Plan: Continue with Rocephin, gout and urine culture will be positive given the fact that the patient was on antibiotics prior to admission. Await urine culture results.   ARF (acute renal failure)    Assessment: Likely prerenal.    Plan: Gently hydrate and recheck electrolytes in the morning.   Leucocytosis  Assessment: likely secondary to steroids, however secondary to UTI as well.    Plan: Monitor CBC periodically.   History of hypertension -Currently blood pressure controlled off medications. Continue to monitor off antihypertensive medications.  COPD -Currently stable. Continue with nebs as needed.  Disposition: Remain inpatient.  DVT Prophylaxis: Lovenox.   Code Status: Full code.  Maretta Bees,  MD. 04/11/2011, 5:43 PM

## 2011-04-11 NOTE — ED Notes (Addendum)
Pt received room, going to rm [id number].  Called floor to give report and spoke with Lynden Ang, Charity fundraiser.

## 2011-04-11 NOTE — ED Notes (Signed)
Pt report received and care assumed from [place], [place].

## 2011-04-11 NOTE — H&P (Signed)
Katie Alvarado is an 77 y.o. female.   Chief Complaint: Weakness HPI: 77 YO brought in by husband due to progressive weakness over a 3 week period. Patient apparently has been having UTI and has been on Cipro but not able to "clear" it. In the last 2 weeks she has had decline in her oral intake followed by persistent and progressive weakness, tiredness and unable to function normally. No fever or chills now. No active NVD. Has been having cough and some sputum production on and off. History of COPD not on home oxygen.  Past Medical History  Diagnosis Date  . Diabetes mellitus   . Other symptoms involving nervous and musculoskeletal systems   . Insomnia, unspecified   . Cough   . Bronchiectasis with acute exacerbation   . Acute nasopharyngitis (common cold)   . Esophageal reflux     Past Surgical History  Procedure Date  . Foot   . Abdominal hysterectomy   . Colon surgery     Family History  Problem Relation Age of Onset  . Chronic bronchitis Father   . Other Sister     heart trouble  . Other Brother     heart trouble   Social History:  reports that she has never smoked. She does not have any smokeless tobacco history on file. She reports that she does not drink alcohol. Her drug history not on file.  Allergies:  Allergies  Allergen Reactions  . Welchol (Colesevelam Hcl) Hives  . Glucosamine Other (See Comments)    Unknown  . Celecoxib Hives, Itching and Rash    Medications Prior to Admission  Medication Dose Route Frequency Provider Last Rate Last Dose  . 0.9 % NaCl with KCl 20 mEq/ L  infusion   Intravenous Continuous Lawal Garba 100 mL/hr at 04/11/11 0315    . acetaminophen (TYLENOL) tablet 650 mg  650 mg Oral Q6H PRN Lawal Garba       Or  . acetaminophen (TYLENOL) suppository 650 mg  650 mg Rectal Q6H PRN Lawal Garba      . albuterol (PROVENTIL HFA;VENTOLIN HFA) 108 (90 BASE) MCG/ACT inhaler 2 puff  2 puff Inhalation Q4H PRN Lawal Garba      . albuterol (PROVENTIL)  (5 MG/ML) 0.5% nebulizer solution 2.5 mg  2.5 mg Nebulization Q2H PRN Provider Default      . albuterol (PROVENTIL) (5 MG/ML) 0.5% nebulizer solution 2.5 mg  2.5 mg Nebulization QID Provider Default      . ALPRAZolam (XANAX) tablet 0.25 mg  0.25 mg Oral TID PRN Lawal Garba   0.25 mg at 04/11/11 0315  . cefTRIAXone (ROCEPHIN) 1 g in dextrose 5 % 50 mL IVPB  1 g Intravenous Q24H Lawal Garba   1 g at 04/11/11 0315  . cholecalciferol (VITAMIN D) tablet 2,000 Units  2,000 Units Oral Daily Lawal Garba      . enoxaparin (LOVENOX) injection 40 mg  40 mg Subcutaneous Q24H Lawal Garba      . ipratropium (ATROVENT) nebulizer solution 0.5 mg  0.5 mg Nebulization QID Provider Default      . ipratropium (ATROVENT) nebulizer solution 500 mcg  500 mcg Inhalation QID Lawal Garba      . ondansetron (ZOFRAN) injection 4 mg  4 mg Intravenous Once Suzi Roots, MD   4 mg at 04/10/11 2145  . ondansetron (ZOFRAN) tablet 4 mg  4 mg Oral Q6H PRN Lawal Garba       Or  . ondansetron (ZOFRAN) injection  4 mg  4 mg Intravenous Q6H PRN Lawal Garba      . pantoprazole (PROTONIX) EC tablet 40 mg  40 mg Oral Daily Lawal Garba      . predniSONE (DELTASONE) tablet 40 mg  40 mg Oral Daily Lawal Garba      . senna (SENOKOT) tablet 8.6 mg  1 tablet Oral BID Lawal Garba   8.6 mg at 04/11/11 0315  . sodium chloride 0.9 % bolus 500 mL  500 mL Intravenous Once Suzi Roots, MD   500 mL at 04/10/11 2145  . DISCONTD: albuterol (PROVENTIL) (5 MG/ML) 0.5% nebulizer solution 2.5 mg  2.5 mg Nebulization Q6H Lawal Garba      . DISCONTD: albuterol (PROVENTIL) (5 MG/ML) 0.5% nebulizer solution           . DISCONTD: ipratropium (ATROVENT) 0.02 % nebulizer solution           . DISCONTD: ipratropium (ATROVENT) nebulizer solution 0.5 mg  0.5 mg Nebulization Q6H Lawal Garba       Medications Prior to Admission  Medication Sig Dispense Refill  . albuterol (PROVENTIL HFA;VENTOLIN HFA) 108 (90 BASE) MCG/ACT inhaler Inhale 2 puffs into the lungs  every 4 (four) hours as needed. For shortness of breath.       . allopurinol (ZYLOPRIM) 100 MG tablet Take 100 mg by mouth daily.        Marland Kitchen ALPRAZolam (XANAX) 0.25 MG tablet Take 0.25 mg by mouth 3 (three) times daily as needed. For anxiety.      Marland Kitchen aspirin 81 MG tablet Take 81 mg by mouth daily.        . cholecalciferol (VITAMIN D) 1000 UNITS tablet Take 2,000 Units by mouth daily.        . ciprofloxacin (CIPRO) 250 MG tablet Take 250 mg by mouth 2 (two) times daily.        Marland Kitchen esomeprazole (NEXIUM) 40 MG capsule Take 40 mg by mouth daily.        Marland Kitchen estrogens, conjugated, (PREMARIN) 0.625 MG tablet Take 0.625 mg by mouth daily. Take daily for 21 days then do not take for 7 days.       Marland Kitchen losartan (COZAAR) 25 MG tablet Take 25 mg by mouth daily.        . meclizine (ANTIVERT) 25 MG tablet Take 25 mg by mouth 3 (three) times daily as needed. For dizziness.      . metFORMIN (GLUCOPHAGE) 500 MG tablet Take 500 mg by mouth 2 (two) times daily.        . predniSONE (DELTASONE) 10 MG tablet Take 40 mg by mouth daily.        . Probiotic Product (PROBIOTIC FORMULA PO) Take 1 capsule by mouth daily.        . solifenacin (VESICARE) 5 MG tablet Take 10 mg by mouth daily.        . traMADol (ULTRAM) 50 MG tablet Take 50 mg by mouth every 8 (eight) hours as needed. For pain. Maximum dose= 8 tablets per day      . triamterene-hydrochlorothiazide (MAXZIDE) 75-50 MG per tablet Take 1 tablet by mouth daily.          Results for orders placed during the hospital encounter of 04/10/11 (from the past 48 hour(s))  COMPREHENSIVE METABOLIC PANEL     Status: Abnormal   Collection Time   04/10/11  9:51 PM      Component Value Range Comment   Sodium 126 (*) 135 -  145 (mEq/L)    Potassium 3.7  3.5 - 5.1 (mEq/L)    Chloride 89 (*) 96 - 112 (mEq/L)    CO2 23  19 - 32 (mEq/L)    Glucose, Bld 129 (*) 70 - 99 (mg/dL)    BUN 57 (*) 6 - 23 (mg/dL)    Creatinine, Ser 1.61 (*) 0.50 - 1.10 (mg/dL)    Calcium 09.6  8.4 - 10.5  (mg/dL)    Total Protein 7.3  6.0 - 8.3 (g/dL)    Albumin 3.8  3.5 - 5.2 (g/dL)    AST 11  0 - 37 (U/L)    ALT 15  0 - 35 (U/L)    Alkaline Phosphatase 58  39 - 117 (U/L)    Total Bilirubin 0.3  0.3 - 1.2 (mg/dL)    GFR calc non Af Amer 27 (*) >90 (mL/min)    GFR calc Af Amer 31 (*) >90 (mL/min)   CBC     Status: Abnormal   Collection Time   04/10/11  9:51 PM      Component Value Range Comment   WBC 19.6 (*) 4.0 - 10.5 (K/uL)    RBC 4.67  3.87 - 5.11 (MIL/uL)    Hemoglobin 14.3  12.0 - 15.0 (g/dL)    HCT 04.5  40.9 - 81.1 (%)    MCV 85.0  78.0 - 100.0 (fL)    MCH 30.6  26.0 - 34.0 (pg)    MCHC 36.0  30.0 - 36.0 (g/dL)    RDW 91.4  78.2 - 95.6 (%)    Platelets 416 (*) 150 - 400 (K/uL)   CARDIAC PANEL(CRET KIN+CKTOT+MB+TROPI)     Status: Normal   Collection Time   04/10/11  9:52 PM      Component Value Range Comment   Total CK 31  7 - 177 (U/L)    CK, MB 2.7  0.3 - 4.0 (ng/mL)    Troponin I <0.30  <0.30 (ng/mL)    Relative Index RELATIVE INDEX IS INVALID  0.0 - 2.5    URINALYSIS, ROUTINE W REFLEX MICROSCOPIC     Status: Abnormal   Collection Time   04/10/11  9:55 PM      Component Value Range Comment   Color, Urine YELLOW  YELLOW     APPearance CLEAR  CLEAR     Specific Gravity, Urine 1.016  1.005 - 1.030     pH 5.5  5.0 - 8.0     Glucose, UA NEGATIVE  NEGATIVE (mg/dL)    Hgb urine dipstick NEGATIVE  NEGATIVE     Bilirubin Urine NEGATIVE  NEGATIVE     Ketones, ur NEGATIVE  NEGATIVE (mg/dL)    Protein, ur NEGATIVE  NEGATIVE (mg/dL)    Urobilinogen, UA 0.2  0.0 - 1.0 (mg/dL)    Nitrite NEGATIVE  NEGATIVE     Leukocytes, UA SMALL (*) NEGATIVE    URINE MICROSCOPIC-ADD ON     Status: Abnormal   Collection Time   04/10/11  9:55 PM      Component Value Range Comment   Squamous Epithelial / LPF FEW (*) RARE     WBC, UA 3-6  <3 (WBC/hpf)    RBC / HPF 0-2  <3 (RBC/hpf)    Bacteria, UA RARE  RARE     Casts HYALINE CASTS (*) NEGATIVE    CBC     Status: Abnormal   Collection  Time   04/11/11  2:30 AM      Component Value  Range Comment   WBC 13.2 (*) 4.0 - 10.5 (K/uL)    RBC 4.46  3.87 - 5.11 (MIL/uL)    Hemoglobin 13.4  12.0 - 15.0 (g/dL)    HCT 04.5  40.9 - 81.1 (%)    MCV 86.1  78.0 - 100.0 (fL)    MCH 30.0  26.0 - 34.0 (pg)    MCHC 34.9  30.0 - 36.0 (g/dL)    RDW 91.4  78.2 - 95.6 (%)    Platelets 351  150 - 400 (K/uL)   COMPREHENSIVE METABOLIC PANEL     Status: Abnormal   Collection Time   04/11/11  2:31 AM      Component Value Range Comment   Sodium 128 (*) 135 - 145 (mEq/L)    Potassium 3.9  3.5 - 5.1 (mEq/L)    Chloride 91 (*) 96 - 112 (mEq/L)    CO2 25  19 - 32 (mEq/L)    Glucose, Bld 148 (*) 70 - 99 (mg/dL)    BUN 53 (*) 6 - 23 (mg/dL)    Creatinine, Ser 2.13 (*) 0.50 - 1.10 (mg/dL)    Calcium 9.4  8.4 - 10.5 (mg/dL)    Total Protein 6.5  6.0 - 8.3 (g/dL)    Albumin 3.4 (*) 3.5 - 5.2 (g/dL)    AST 12  0 - 37 (U/L)    ALT 14  0 - 35 (U/L)    Alkaline Phosphatase 51  39 - 117 (U/L)    Total Bilirubin 0.4  0.3 - 1.2 (mg/dL)    GFR calc non Af Amer 32 (*) >90 (mL/min)    GFR calc Af Amer 37 (*) >90 (mL/min)   CBC     Status: Abnormal   Collection Time   04/11/11  2:31 AM      Component Value Range Comment   WBC 13.9 (*) 4.0 - 10.5 (K/uL)    RBC 4.50  3.87 - 5.11 (MIL/uL)    Hemoglobin 13.3  12.0 - 15.0 (g/dL)    HCT 08.6  57.8 - 46.9 (%)    MCV 86.2  78.0 - 100.0 (fL)    MCH 29.6  26.0 - 34.0 (pg)    MCHC 34.3  30.0 - 36.0 (g/dL)    RDW 62.9  52.8 - 41.3 (%)    Platelets 362  150 - 400 (K/uL)    Dg Chest 2 View  04/10/2011  *RADIOLOGY REPORT*  Clinical Data: Cough, congestion, weakness  CHEST - 2 VIEW  Comparison: 04/09/2011  Findings: Stable mild patchy opacities/scarring in the right upper lobe and lingula/left lower lobe. No pleural effusion or pneumothorax.  Cardiomediastinal silhouette is within normal limits.  Degenerative changes of the visualized thoracolumbar spine.  IMPRESSION: Stable mild patchy opacities/scarring in the  right upper lobe and lingula/left lower lobe, chronic.  Original Report Authenticated By: Charline Bills, M.D.   Dg Chest 2 View  04/09/2011  *RADIOLOGY REPORT*  Clinical Data: Shortness of breath, cough, chest pain  CHEST - 2 VIEW  Comparison: 02/06/2010 and earlier studies  Findings: Patchy streaky and nodular opacities peripherally in the right upper lobe and lingula, without interval change.  Heart size upper limits normal.  Tortuous atheromatous thoracic aorta.  No effusion.  Regional bones unremarkable.  Mild hyperinflation.  IMPRESSION:  Stable patchy   parenchymal opacities as before.  No acute or superimposed abnormality.  Original Report Authenticated By: Osa Craver, M.D.    Review of Systems  Constitutional: Positive for fever, chills,  weight loss, malaise/fatigue and diaphoresis.  HENT: Positive for congestion and sore throat. Negative for nosebleeds.   Eyes: Negative.   Respiratory: Positive for cough and shortness of breath. Negative for hemoptysis, sputum production, wheezing and stridor.   Cardiovascular: Negative.   Gastrointestinal: Positive for nausea and abdominal pain. Negative for vomiting, diarrhea, constipation, blood in stool and melena.  Genitourinary: Positive for dysuria, urgency and frequency. Negative for hematuria and flank pain.  Musculoskeletal: Positive for myalgias and joint pain.  Skin: Negative.   Neurological: Positive for weakness.  Endo/Heme/Allergies: Negative.   Psychiatric/Behavioral: Negative.     Blood pressure 119/71, pulse 64, temperature 97.7 F (36.5 C), temperature source Oral, resp. rate 19, height 5\' 7"  (1.702 m), weight 75.297 kg (166 lb), SpO2 95.00%. Physical Exam  Constitutional: She is oriented to person, place, and time. She appears well-developed and well-nourished.       Weak looking  HENT:  Head: Normocephalic and atraumatic.  Right Ear: External ear normal.  Left Ear: External ear normal.  Nose: Nose normal.    Mouth/Throat: Oropharynx is clear and moist.  Eyes: Conjunctivae and EOM are normal. Pupils are equal, round, and reactive to light.  Neck: Normal range of motion. Neck supple.  Cardiovascular: Normal rate, regular rhythm, normal heart sounds and intact distal pulses.   Respiratory: Effort normal and breath sounds normal.  GI: Soft. Bowel sounds are normal.  Musculoskeletal: Normal range of motion.  Neurological: She is alert and oriented to person, place, and time. She has normal reflexes.  Skin: Skin is warm and dry.  Psychiatric: She has a normal mood and affect. Her behavior is normal. Judgment and thought content normal.     Assessment/Plan 1. Weakness: Multifactorial from UTI, ARF, Dehydration. Will address the causes and also PT/OT 2. UTI: Will get urine c&S. Empirically will try Rocephine IV. The organism may be resistant to quinolones. 3. Hyponatremia: more than likely due to dehydration. Will hydrate with saline 4. ARF: Pre-renal. Hydrate 5. DM: SSI 6.Chronic cough: CXR shows chronic changes. Will give nebs, has Rocephine ordered as well. On chronic Prednisone. We will continue 7. Hypotension: Due to dehydration. Hydrate  GARBA,LAWAL 04/11/2011, 5:09 AM

## 2011-04-11 NOTE — ED Notes (Signed)
Called floor to inform nurse that admit orders were in and that the pt was going to be enroute to her room.

## 2011-04-12 LAB — CBC
Hemoglobin: 11.7 g/dL — ABNORMAL LOW (ref 12.0–15.0)
Platelets: 279 10*3/uL (ref 150–400)
RBC: 3.94 MIL/uL (ref 3.87–5.11)
WBC: 9.5 10*3/uL (ref 4.0–10.5)

## 2011-04-12 LAB — BASIC METABOLIC PANEL
CO2: 27 mEq/L (ref 19–32)
Calcium: 8.9 mg/dL (ref 8.4–10.5)
Chloride: 98 mEq/L (ref 96–112)
Glucose, Bld: 161 mg/dL — ABNORMAL HIGH (ref 70–99)
Potassium: 4.2 mEq/L (ref 3.5–5.1)
Sodium: 132 mEq/L — ABNORMAL LOW (ref 135–145)

## 2011-04-12 LAB — HEMOGLOBIN A1C: Hgb A1c MFr Bld: 8.1 % — ABNORMAL HIGH (ref <5.7)

## 2011-04-12 LAB — GLUCOSE, CAPILLARY
Glucose-Capillary: 263 mg/dL — ABNORMAL HIGH (ref 70–99)
Glucose-Capillary: 135 mg/dL — ABNORMAL HIGH (ref 70–99)
Glucose-Capillary: 369 mg/dL — ABNORMAL HIGH (ref 70–99)

## 2011-04-12 LAB — T4, FREE: Free T4: 1.35 ng/dL (ref 0.80–1.80)

## 2011-04-12 MED ORDER — PREDNISONE 10 MG PO TABS: ORAL_TABLET | ORAL | Status: DC

## 2011-04-12 MED ORDER — CEFUROXIME AXETIL 500 MG PO TABS: 500.0000 mg | ORAL_TABLET | Freq: Two times a day (BID) | ORAL | Status: AC

## 2011-04-12 MED ORDER — INSULIN ASPART 100 UNIT/ML ~~LOC~~ SOLN: SUBCUTANEOUS | Status: DC

## 2011-04-12 MED ORDER — INSULIN PEN STARTER KIT
1.0000 | Freq: Once | Status: AC
  Administered 2011-04-12: 1
  Filled 2011-04-12: qty 1

## 2011-04-12 NOTE — Progress Notes (Signed)
   CARE MANAGEMENT NOTE 04/12/2011  Patient:  STORM, DULSKI   Account Number:  [id number]  Date Initiated:  04/12/2011  Documentation initiated by:  Letha Cape  Subjective/Objective Assessment:   dx weakness  admit- lives alone. pta independent.     Action/Plan:   pt eval- rec no pt needs.   Anticipated DC Date:  04/12/2011   Anticipated DC Plan:  HOME/SELF CARE      DC Planning Services  CM consult      Choice offered to / List presented to:             Status of service:  Completed, signed off Medicare Important Message given?   (If response is "NO", the following Medicare IM given date fields will be blank) Date Medicare IM given:   Date Additional Medicare IM given:    Discharge Disposition:  HOME/SELF CARE  Per UR Regulation:    Comments:  PCP Dr. Catha Gosselin and Dr. Luciana Axe  04/12/11 12:15 Letha Cape RN, BSN [phone] Patient for dc today, per physical therapy patient has no pt needs.

## 2011-04-12 NOTE — Progress Notes (Signed)
Inpatient Diabetes Program Recommendations  AACE/ADA: New Consensus Statement on Inpatient Glycemic Control (2009)  Target Ranges:  Prepandial:   less than 140 mg/dL      Peak postprandial:   less than 180 mg/dL (1-2 hours)      Critically ill patients:  140 - 180 mg/dL   CBGs 16/10: 96/ 045/ 263 mg/dl  Inpatient Diabetes Program Recommendations Insulin - Meal Coverage: May need to add Novolog meal coverage if postprandial CBGs stay elevated due to Prednisone- Recommend Novolog 4 units tid with meals  Will follow.

## 2011-04-12 NOTE — Progress Notes (Signed)
PT Discharge Note  Patient is being discharged from PT services secondary to:  Goals met and no further therapy needs identified.  Please see latest Therapy Progress Note for current level of functioning and progress toward goals.  Progress and discharge plan and discussed with patient/caregiver and they agree.  Celinda Dethlefs L. Kenny Rea DPT [phone] 04/12/2011 

## 2011-04-12 NOTE — Progress Notes (Signed)
Occupational Therapy Evaluation Patient Details Name: Katie Alvarado MRN: [id number] DOB: [birth year] Today's Date: 04/12/2011  Problem List:  Patient Active Problem List  Diagnoses  . DM  . ACUTE NASOPHARYNGITIS  . BRONCHIECTASIS  . BRONCHIECTASIS WITH ACUTE EXACERBATION  . ESOPHAGEAL REFLUX  . DIZZINESS  . INSOMNIA  . MUSCULOSKELETAL PAIN  . COUGH, CHRONIC  . Weakness  . Hyponatremia  . UTI (lower urinary tract infection)  . ARF (acute renal failure)  . Leucocytosis  . Dehydration  . Hypotension    Past Medical History:  Past Medical History  Diagnosis Date  . Diabetes mellitus   . Other symptoms involving nervous and musculoskeletal systems   . Insomnia, unspecified   . Cough   . Bronchiectasis with acute exacerbation   . Acute nasopharyngitis (common cold)   . Esophageal reflux    Past Surgical History:  Past Surgical History  Procedure Date  . Foot   . Abdominal hysterectomy   . Colon surgery     OT Assessment/Plan/Recommendation OT Assessment: Pt. Admitted with h/o weakness for 3 weeks and taking Cipro for UTI with minimal improvement.  Pt. with UTI, ARF, and Dehydration.  Patient reports eager to get back home and states that she is doing so much better now.  Pt.'s neighbor present and reports she is hoping to drive patient home today!  Pt. Lives alone and was independent with BADL and IADL PTA and driving "I usually only drive short distances".  Pt reports that her daughter lives locally yet works and has a family of her own and pt. Does not want to "bother" her.  Patient is currently overall Modified Independent with BADL & IADL secondary to occasional increased time needed for task or adaptive method to improve safety. OT Recommendation/Assessment: Patient does not need any further OT services and no DME recommendations at this time. OT Evaluation Precautions/Restrictions  Precautions Precautions: Fall Precaution Comments: Overall weakness which seems to  be much improved according to patiient Prior Functioning Home Living Lives With: Alone Type of Home: House Bathroom Shower/Tub: Walk-in shower;Door Bathroom Toilet: Handicapped height Home Adaptive Equipment: Built-in shower seat Additional Comments: recommended Grab Bars installed in shower Prior Function Level of Independence: Independent with basic ADLs;Independent with homemaking with ambulation;Independent with transfers;Independent with gait Driving: Yes Vocation: Retired ADL ADL Eating/Feeding: Simulated;Independent Where Assessed - Eating/Feeding: Chair Grooming: Simulated;Performed;Wash/dry hands;Wash/dry face;Teeth care;Brushing hair;Modified independent Where Assessed - Grooming: Standing at sink Upper Body Bathing: Simulated;Modified independent Where Assessed - Upper Body Bathing: Standing at sink Lower Body Bathing: Simulated;Modified independent (pt agreed to sit instead of stand on 1 leg to wash feet) Where Assessed - Lower Body Bathing: Standing at sink;Sitting, bed Upper Body Dressing: Simulated;Independent Where Assessed - Upper Body Dressing: Sitting, bed Lower Body Dressing: Simulated;Modified independent Where Assessed - Lower Body Dressing: Sitting, bed;Standing Toilet Transfer: Performed;Modified independent Toilet Transfer Method: Stand pivot Acupuncturist: Regular height toilet;Grab bars Toileting - Clothing Manipulation: Simulated;Independent Where Assessed - Toileting Clothing Manipulation: Sit to stand from 3-in-1 or toilet Toileting - Hygiene: Simulated;Modified independent Where Assessed - Toileting Hygiene: Sit on 3-in-1 or toilet Tub/Shower Transfer: Simulated;Supervision/safety Tub/Shower Transfer Method: Stand pivot Tub/Shower Transfer Equipment: Walk in shower;Other (comment) (built in seat) Vision/Perception  Vision - History Baseline Vision: Wears glasses all the time Cognition Cognition Arousal/Alertness:  Awake/alert Overall Cognitive Status: Appears within functional limits for tasks assessed Extremity Assessment RUE Assessment RUE Assessment: Within Functional Limits LUE Assessment LUE Assessment: Within Functional Limits Mobility  Bed Mobility Bed Mobility:  Yes Supine to Sit: 6: Modified independent (Device/Increase time);Other (comment) (increased time) Transfers Transfers: Yes Sit to Stand: 6: Modified independent (Device/Increase time) Stand to Sit: 6: Modified independent (Device/Increase time) End of Session OT - End of Session Activity Tolerance: Patient tolerated treatment well Patient left: in bed;with call bell in reach;with family/visitor present;Other (comment) (friend and neighbor present) General Behavior During Session: [facility] for tasks performed Cognition: [facility] for tasks performed   Ameenah Prosser 04/12/2011, 11:37 AM

## 2011-04-12 NOTE — Discharge Summary (Signed)
PATIENT DETAILS Name: Katie Alvarado Age: 77 y.o. Sex: female Date of Birth: 01/28/1926 MRN: [id number]. Admit Date: 04/10/2011 Admitting Physician: Jeoffrey Massed PCP:No primary provider on file.  PRIMARY DISCHARGE DIAGNOSIS:  Principal Problem:  *Weakness Active Problems:  DM  COUGH, CHRONIC  Hyponatremia  UTI (lower urinary tract infection)  ARF (acute renal failure)  Leucocytosis  Dehydration  Hypotension      PAST MEDICAL HISTORY: Past Medical History  Diagnosis Date  . Diabetes mellitus   . Other symptoms involving nervous and musculoskeletal systems   . Insomnia, unspecified   . Cough   . Bronchiectasis with acute exacerbation   . Acute nasopharyngitis (common cold)   . Esophageal reflux     DISCHARGE MEDICATIONS: Current Discharge Medication List    START taking these medications   Details  cefUROXime (CEFTIN) 500 MG tablet Take 1 tablet (500 mg total) by mouth 2 (two) times daily. Qty: 10 tablet, Refills: 0    insulin aspart (NOVOLOG) 100 UNIT/ML injection CBG 70 - 150: 0 units CBG 151 - 200: 2 units CBG 201 - 250: 4 units CBG 251 - 300: 6 units CBG 301 - 350: 8 units CBG 351 - 400: 10 units CBG > 400: call MD Qty: 1 vial, Refills: 0      CONTINUE these medications which have CHANGED   Details  predniSONE (DELTASONE) 10 MG tablet Take 4 tablets from 12/22 to 04/17/11 Take 3 tablets from 12/27 to 04/22/11 Take 2 tablets from 13/01 onwards, and stay on it till seen by Dr Nickola Major on the 05/08/11 Qty: 67 tablet, Refills: 0      CONTINUE these medications which have NOT CHANGED   Details  albuterol (PROVENTIL HFA;VENTOLIN HFA) 108 (90 BASE) MCG/ACT inhaler Inhale 2 puffs into the lungs every 4 (four) hours as needed. For shortness of breath.     allopurinol (ZYLOPRIM) 100 MG tablet Take 100 mg by mouth daily.      ALPRAZolam (XANAX) 0.25 MG tablet Take 0.25 mg by mouth 3 (three) times daily as needed. For anxiety.    aspirin 81 MG tablet Take 81  mg by mouth daily.      cholecalciferol (VITAMIN D) 1000 UNITS tablet Take 2,000 Units by mouth daily.      esomeprazole (NEXIUM) 40 MG capsule Take 40 mg by mouth daily.      estrogens, conjugated, (PREMARIN) 0.625 MG tablet Take 0.625 mg by mouth daily. Take daily for 21 days then do not take for 7 days.     ipratropium (ATROVENT) 0.02 % nebulizer solution Inhale 500 mcg into the lungs 4 (four) times daily.      losartan (COZAAR) 25 MG tablet Take 25 mg by mouth daily.      meclizine (ANTIVERT) 25 MG tablet Take 25 mg by mouth 3 (three) times daily as needed. For dizziness.    metFORMIN (GLUCOPHAGE) 500 MG tablet Take 500 mg by mouth 2 (two) times daily.      Probiotic Product (PROBIOTIC FORMULA PO) Take 1 capsule by mouth daily.      solifenacin (VESICARE) 5 MG tablet Take 10 mg by mouth daily.      traMADol (ULTRAM) 50 MG tablet Take 50 mg by mouth every 8 (eight) hours as needed. For pain. Maximum dose= 8 tablets per day      STOP taking these medications     ciprofloxacin (CIPRO) 250 MG tablet      triamterene-hydrochlorothiazide (MAXZIDE) 75-50 MG per tablet  BRIEF HPI:  See H&P, Labs, Consult and Test reports for all details in brief, patient was admitted for **  CONSULTATIONS:   none  PERTINENT RADIOLOGIC STUDIES: Dg Chest 2 View  04/10/2011  *RADIOLOGY REPORT*  Clinical Data: Cough, congestion, weakness  CHEST - 2 VIEW  Comparison: 04/09/2011  Findings: Stable mild patchy opacities/scarring in the right upper lobe and lingula/left lower lobe. No pleural effusion or pneumothorax.  Cardiomediastinal silhouette is within normal limits.  Degenerative changes of the visualized thoracolumbar spine.  IMPRESSION: Stable mild patchy opacities/scarring in the right upper lobe and lingula/left lower lobe, chronic.  Original Report Authenticated By: Charline Bills, M.D.   Dg Chest 2 View  04/09/2011  *RADIOLOGY REPORT*  Clinical Data: Shortness of breath, cough,  chest pain  CHEST - 2 VIEW  Comparison: 02/06/2010 and earlier studies  Findings: Patchy streaky and nodular opacities peripherally in the right upper lobe and lingula, without interval change.  Heart size upper limits normal.  Tortuous atheromatous thoracic aorta.  No effusion.  Regional bones unremarkable.  Mild hyperinflation.  IMPRESSION:  Stable patchy   parenchymal opacities as before.  No acute or superimposed abnormality.  Original Report Authenticated By: Osa Craver, M.D.     PERTINENT LAB RESULTS: CBC:  Basename 04/12/11 0545 04/11/11 0231  WBC 9.5 13.9*  HGB 11.7* 13.3  HCT 34.1* 38.8  PLT 279 362   CMET CMP     Component Value Date/Time   NA 132* 04/12/2011 0545   K 4.2 04/12/2011 0545   CL 98 04/12/2011 0545   CO2 27 04/12/2011 0545   GLUCOSE 161* 04/12/2011 0545   BUN 25* 04/12/2011 0545   CREATININE 0.91 04/12/2011 0545   CALCIUM 8.9 04/12/2011 0545   PROT 6.5 04/11/2011 0231   ALBUMIN 3.4* 04/11/2011 0231   AST 12 04/11/2011 0231   ALT 14 04/11/2011 0231   ALKPHOS 51 04/11/2011 0231   BILITOT 0.4 04/11/2011 0231   GFRNONAA 57* 04/12/2011 0545   GFRAA 66* 04/12/2011 0545    GFR Estimated Creatinine Clearance: 49.8 ml/min (by C-G formula based on Cr of 0.91). No results found for this basename: LIPASE:2,AMYLASE:2 in the last 72 hours  Basename 04/10/11 2152  CKTOTAL 31  CKMB 2.7  CKMBINDEX --  TROPONINI <0.30   No components found with this basename: POCBNP:3 No results found for this basename: DDIMER:2 in the last 72 hours  Basename 04/11/11 1915 04/11/11 0230  HGBA1C 8.1* 8.3*   No results found for this basename: CHOL:2,HDL:2,LDLCALC:2,TRIG:2,CHOLHDL:2,LDLDIRECT:2 in the last 72 hours  Basename 04/11/11 0230  TSH 7.326*  T4TOTAL --  T3FREE --  THYROIDAB --   No results found for this basename: VITAMINB12:2,FOLATE:2,FERRITIN:2,TIBC:2,IRON:2,RETICCTPCT:2 in the last 72 hours Coags: No results found for this basename: PT:2,INR:2 in  the last 72 hours Microbiology: Recent Results (from the past 240 hour(s))  URINE CULTURE     Status: Normal   Collection Time   04/10/11  9:55 PM      Component Value Range Status Comment   Specimen Description URINE, CATHETERIZED   Final    Special Requests ADDED ON [id number] @2307    Final    Setup Time [id number]   Final    Colony Count NO GROWTH   Final    Culture NO GROWTH   Final    Report Status 04/11/2011 FINAL   Final      BRIEF HOSPITAL COURSE:   Principal Problem:  *Weakness -Still not clear whether this weakness was coming from her  UTI, hyponatremia or from underlying polymyalgia rheumatica. -In any event patient was admitted given IV fluids and placed on IV Rocephin. Urine cultures were obtained. -Unfortunately urine cultures came back negative, after hydration her mild renal failure and hyponatremia resolved. -The patient she was taking 20 mg of prednisone daily at home, this was increased to 40 mg here in the hospital. -With hydration, empiric antibiotics and increasing the prednisone does she feel significantly better. -She was evaluated by physical therapy services and deemed not to require any services at home. -She is being discharged home in stable condition.  Active Problems:  DM -Unfortunately her sugars on the higher side things as she is on steroids. -On discharge she will be resumed back on her metformin, as her kidney failure has resolved. -While she is on steroids she would she be placed on a sliding scale insulin regimen. Insulin teaching has been done by the RN.    Hyponatremia -This is secondary to probable dehydration. This resolved with hydration.  UTI (lower urinary tract infection) -Urine cultures were unfortunately negative, to transition to Ceftin for 5 days on discharge.    ARF (acute Dorothy not thought renal failure) -This is perhaps secondary to poor oral intake and the use of diuretics.  -This is resolved with hydration.     Leucocytosis -Unknown whether this was from a urinary tract infection or from the use of steroids. But at the time of discharge had come down to 9.5. Please note patient has been persistently afebrile.    Dehydration -Resolved with hydration.    Hypotension Patient was transiently hypotensive on admission, she was hydrated and her blood pressure now has normalized. She did not require any antihypertensive medications during her hospital stay, however on discharge she'll be resumed on Cozaar and diuretics will be placed on hold. She has been asked to followup with her primary care practitioner to see if she needs further optimization in addition of further agents.      TODAY-DAY OF DISCHARGE:  Subjective:   Katie Alvarado today has no headache,no chest abdominal pain,no new weakness tingling or numbness, feels much better wants to go home today. Pain in her bilateral shoulder and neck area is better.  Objective:   Blood pressure 133/74, pulse 67, temperature 97.5 F (36.4 C), temperature source Oral, resp. rate 20, height 5\' 7"  (1.702 m), weight 75.978 kg (167 lb 8 oz), SpO2 97.00%.  Intake/Output Summary (Last 24 hours) at 04/12/11 1533 Last data filed at 04/12/11 1500  Gross per 24 hour  Intake 3027.5 ml  Output      2 ml  Net 3025.5 ml    Exam Awake Alert, Oriented *3, No new F.N deficits, Normal affect [place].AT,PERRAL Supple Neck,No JVD, No cervical lymphadenopathy appriciated.  Symmetrical Chest wall movement, Good air movement bilaterally, CTAB RRR,No Gallops,Rubs or new Murmurs, No Parasternal Heave +ve B.Sounds, Abd Soft, Non tender, No organomegaly appriciated, No rebound -guarding or rigidity. No Cyanosis, Clubbing or edema, No new Rash or bruise  DISPOSITION: Home  DISCHARGE INSTRUCTIONS:    Follow-up Information    Follow up with Mickie Hillier. Make an appointment in 1 week.   Contact information:   [address] [employer],  [place]. [place] [address] [phone]       Follow up with HAWKES,ANGELA D on 05/08/2011. (please keep existing appointment)    Contact information:   [address] [place] [address] [phone]  Total Time spent on discharge equals 45 minutes.  SignedJeoffrey Massed 04/12/2011 3:33 PM

## 2011-04-12 NOTE — Progress Notes (Signed)
Discussed insulin administration with pt and pt's friend, demonstrated use of the Novolog Flexpen to both of them, and had pt demonstrate back to me by giving herself 10 units at 1700, prior to dinner. Also discussed Flexpen Starter Kit, and had pt view the CBG pt education video. Pt showed no barriers to learning. Discharged to home.

## 2011-04-12 NOTE — Progress Notes (Signed)
Physical Therapy Evaluation Patient Details Name: Katie Alvarado MRN: [id number] DOB: [birth year] Today's Date: 04/12/2011  Problem List:  Patient Active Problem List  Diagnoses  . DM  . ACUTE NASOPHARYNGITIS  . BRONCHIECTASIS  . BRONCHIECTASIS WITH ACUTE EXACERBATION  . ESOPHAGEAL REFLUX  . DIZZINESS  . INSOMNIA  . MUSCULOSKELETAL PAIN  . COUGH, CHRONIC  . Weakness  . Hyponatremia  . UTI (lower urinary tract infection)  . ARF (acute renal failure)  . Leucocytosis  . Dehydration  . Hypotension    Past Medical History:  Past Medical History  Diagnosis Date  . Diabetes mellitus   . Other symptoms involving nervous and musculoskeletal systems   . Insomnia, unspecified   . Cough   . Bronchiectasis with acute exacerbation   . Acute nasopharyngitis (common cold)   . Esophageal reflux    Past Surgical History:  Past Surgical History  Procedure Date  . Foot   . Abdominal hysterectomy   . Colon surgery     PT Assessment/Plan/Recommendation PT Assessment Clinical Impression Statement: Pt is independent with all mobility and is appropriate for return to home alone.   PT Recommendation/Assessment: Patent does not need any further PT services No Skilled PT: Patient is modified independent with all activity/mobility PT Recommendation Follow Up Recommendations: None Equipment Recommended: None recommended by PT PT Goals  Acute Rehab PT Goals PT Goal Formulation: With patient  PT Evaluation Precautions/Restrictions  Precautions Precautions: Fall Precaution Comments: Overall weakness which seems to be much improved according to patiient Prior Functioning  Home Living Lives With: Alone Type of Home: House Bathroom Shower/Tub: Walk-in shower;Door Bathroom Toilet: Handicapped height Home Adaptive Equipment: Built-in shower seat Additional Comments: recommended Grab Bars installed in shower Prior Function Level of Independence: Independent with basic ADLs;Independent  with homemaking with ambulation;Independent with transfers;Independent with gait Driving: Yes Vocation: Retired Producer, television/film/video: Awake/alert Overall Cognitive Status: Appears within functional limits for tasks assessed Sensation/Coordination Sensation Light Touch: Appears Intact Stereognosis: Not tested Hot/Cold: Not tested Proprioception: Appears Intact Coordination Gross Motor Movements are Fluid and Coordinated: Yes Fine Motor Movements are Fluid and Coordinated: Not tested Extremity Assessment RUE Assessment RUE Assessment: Within Functional Limits LUE Assessment LUE Assessment: Within Functional Limits RLE Assessment RLE Assessment: Within Functional Limits LLE Assessment LLE Assessment: Within Functional Limits Mobility (including Balance) Bed Mobility Bed Mobility: No Supine to Sit: 6: Modified independent (Device/Increase time);Other (comment) (increased time) Transfers Transfers: Yes Sit to Stand: 7: Independent Stand to Sit: 7: Independent Ambulation/Gait Ambulation/Gait: Yes Ambulation/Gait Assistance: 7: Independent Ambulation Distance (Feet): 200 Feet Assistive device: None Gait Pattern: Within Functional Limits Gait velocity: WFL Stairs: No Wheelchair Mobility Wheelchair Mobility: No  Posture/Postural Control Posture/Postural Control: No significant limitations Balance Balance Assessed: Yes Dynamic Gait Index Level Surface: Normal Change in Gait Speed: Normal Gait with Horizontal Head Turns: Normal Gait with Vertical Head Turns: Normal Gait and Pivot Turn: Normal Step Over Obstacle: Mild Impairment Step Around Obstacles: Normal Steps: Normal Total Score: 23  Exercise    End of Session PT - End of Session Equipment Utilized During Treatment: Gait belt Activity Tolerance: Patient tolerated treatment well Patient left: in chair;with call bell in reach;with family/visitor present Nurse Communication: Mobility status for  transfers;Mobility status for ambulation General Behavior During Session: [facility] for tasks performed Cognition: [facility] for tasks performed  Katie Alvarado L. Sandy Haye DPT [phone] Alferd Apa 04/12/2011, 2:44 PM

## 2011-04-15 NOTE — Progress Notes (Signed)
04/15/2011 Fransico Michael SPARKS Case Management Note [phone]  Utilization review completed.

## 2011-04-17 ENCOUNTER — Telehealth: Payer: Self-pay | Admitting: Internal Medicine

## 2011-04-17 NOTE — Telephone Encounter (Signed)
[id number] x1 

## 2011-04-17 NOTE — Telephone Encounter (Signed)
Patient returned call.  Advised of 12.18.12 labs/cxr results / recs as stated by CDY.  Pt verbalized her understanding.  Nothing further needed.

## 2011-04-24 DIAGNOSIS — I10 Essential (primary) hypertension: Secondary | ICD-10-CM | POA: Diagnosis not present

## 2011-04-24 DIAGNOSIS — E559 Vitamin D deficiency, unspecified: Secondary | ICD-10-CM | POA: Diagnosis not present

## 2011-04-24 DIAGNOSIS — E871 Hypo-osmolality and hyponatremia: Secondary | ICD-10-CM | POA: Diagnosis not present

## 2011-04-24 DIAGNOSIS — E119 Type 2 diabetes mellitus without complications: Secondary | ICD-10-CM | POA: Diagnosis not present

## 2011-05-08 DIAGNOSIS — IMO0001 Reserved for inherently not codable concepts without codable children: Secondary | ICD-10-CM | POA: Diagnosis not present

## 2011-05-08 DIAGNOSIS — R700 Elevated erythrocyte sedimentation rate: Secondary | ICD-10-CM | POA: Diagnosis not present

## 2011-05-08 DIAGNOSIS — M2550 Pain in unspecified joint: Secondary | ICD-10-CM | POA: Diagnosis not present

## 2011-05-20 DIAGNOSIS — I10 Essential (primary) hypertension: Secondary | ICD-10-CM | POA: Diagnosis not present

## 2011-05-20 DIAGNOSIS — R300 Dysuria: Secondary | ICD-10-CM | POA: Diagnosis not present

## 2011-05-21 ENCOUNTER — Ambulatory Visit: Payer: Medicare Other | Admitting: Internal Medicine

## 2011-05-24 DIAGNOSIS — R609 Edema, unspecified: Secondary | ICD-10-CM | POA: Diagnosis not present

## 2011-05-24 DIAGNOSIS — E119 Type 2 diabetes mellitus without complications: Secondary | ICD-10-CM | POA: Diagnosis not present

## 2011-05-24 DIAGNOSIS — I10 Essential (primary) hypertension: Secondary | ICD-10-CM | POA: Diagnosis not present

## 2011-06-06 DIAGNOSIS — M2550 Pain in unspecified joint: Secondary | ICD-10-CM | POA: Diagnosis not present

## 2011-06-06 DIAGNOSIS — R700 Elevated erythrocyte sedimentation rate: Secondary | ICD-10-CM | POA: Diagnosis not present

## 2011-06-06 DIAGNOSIS — R5381 Other malaise: Secondary | ICD-10-CM | POA: Diagnosis not present

## 2011-07-04 DIAGNOSIS — H353190 Nonexudative age-related macular degeneration, unspecified eye, stage unspecified: Secondary | ICD-10-CM | POA: Diagnosis not present

## 2011-07-18 DIAGNOSIS — M76899 Other specified enthesopathies of unspecified lower limb, excluding foot: Secondary | ICD-10-CM | POA: Diagnosis not present

## 2011-07-18 DIAGNOSIS — M25569 Pain in unspecified knee: Secondary | ICD-10-CM | POA: Diagnosis not present

## 2011-07-24 DIAGNOSIS — M25559 Pain in unspecified hip: Secondary | ICD-10-CM | POA: Diagnosis not present

## 2011-08-21 DIAGNOSIS — E119 Type 2 diabetes mellitus without complications: Secondary | ICD-10-CM | POA: Diagnosis not present

## 2011-08-21 DIAGNOSIS — R7989 Other specified abnormal findings of blood chemistry: Secondary | ICD-10-CM | POA: Diagnosis not present

## 2011-08-21 DIAGNOSIS — R799 Abnormal finding of blood chemistry, unspecified: Secondary | ICD-10-CM | POA: Diagnosis not present

## 2011-09-04 ENCOUNTER — Other Ambulatory Visit: Payer: Self-pay | Admitting: Internal Medicine

## 2011-09-08 ENCOUNTER — Other Ambulatory Visit: Payer: Self-pay | Admitting: Internal Medicine

## 2011-09-20 ENCOUNTER — Other Ambulatory Visit: Payer: Self-pay | Admitting: Internal Medicine

## 2011-09-24 ENCOUNTER — Telehealth: Payer: Self-pay | Admitting: Critical Care Medicine

## 2011-09-24 MED ORDER — ALBUTEROL SULFATE HFA 108 (90 BASE) MCG/ACT IN AERS: 2.0000 | INHALATION_SPRAY | RESPIRATORY_TRACT | Status: DC | PRN

## 2011-09-24 NOTE — Telephone Encounter (Signed)
I spoke with pt and is aware rx was sent and to keep her apt with cdy for further refills

## 2011-09-30 ENCOUNTER — Ambulatory Visit (INDEPENDENT_AMBULATORY_CARE_PROVIDER_SITE_OTHER): Payer: Medicare Other | Admitting: Internal Medicine

## 2011-09-30 ENCOUNTER — Encounter: Payer: Self-pay | Admitting: Internal Medicine

## 2011-09-30 VITALS — BP 134/62 | HR 99 | Ht 68.0 in | Wt 163.4 lb

## 2011-09-30 DIAGNOSIS — J471 Bronchiectasis with (acute) exacerbation: Secondary | ICD-10-CM

## 2011-09-30 DIAGNOSIS — R5381 Other malaise: Secondary | ICD-10-CM | POA: Diagnosis not present

## 2011-09-30 DIAGNOSIS — R5383 Other fatigue: Secondary | ICD-10-CM

## 2011-09-30 DIAGNOSIS — R531 Weakness: Secondary | ICD-10-CM

## 2011-09-30 DIAGNOSIS — J479 Bronchiectasis, uncomplicated: Secondary | ICD-10-CM | POA: Diagnosis not present

## 2011-09-30 MED ORDER — TRAMADOL HCL 50 MG PO TABS: ORAL_TABLET | ORAL | Status: DC

## 2011-09-30 MED ORDER — CLARITHROMYCIN 250 MG PO TABS: ORAL_TABLET | ORAL | Status: AC

## 2011-09-30 NOTE — Patient Instructions (Addendum)
Script sent for tramadol to try for cough if needed  Script sent for biaxin antibiotic

## 2011-09-30 NOTE — Progress Notes (Signed)
04/09/11- 85 yoF never smoker, followed for bronchiectasis/ chronic bronchitis, complicated by hx of GERD, DM. LOV-May 15, 2010 Has had flu vax. We called in a Zpak for bronchitis in August - she says it helped. She complains of weakness "tired" over the past several weeks- nothing specific- but has difficulty walking around home. Was having left neck pain. Dr Haroldine Laws ENT gave antibiotic. Dr Wyline Beady gave prednisone taper, xray'd neck. She says prednisone helped her neck pain. Tried a heating pad but burned herself. Cough also improved after the prednisone.  Denies fever, but has had some sweat. Weight up, then down. No chest pain and little residual dry cough.  09/30/11- 85 yoF never smoker, followed for bronchiectasis/ chronic bronchitis, complicated by hx of GERD, DM  Patient still c/o cough with clear mucus and sob. She complains of persistent weakness such that it can be hard to get out of bed in the morning. Legs give out. She doesn't describe claudication or exertional chest pain. Weakness is more important than shortness of breath. Scant clear mucus.  CXR 04/17/11 reviewed with her. IMPRESSION:  Stable patchy parenchymal opacities as before. No acute or  superimposed abnormality.  Original Report Authenticated By: Thora Lance III, M.D.   ROS-see HPI Constitutional:     weight loss- gained then lost, night sweats, fevers, chills, fatigue, lassitude. HEENT:   No-  headaches, difficulty swallowing, tooth/dental problems, sore throat,       No-  sneezing, itching, ear ache, nasal congestion, post nasal drip,  CV:  No-   chest pain, orthopnea, PND, swelling in lower extremities, anasarca,  dizziness, palpitations Resp: Mild  shortness of breath with exertion or at rest.              +   productive cough,  + non-productive cough,  No- coughing up of blood.              No-   change in color of mucus.  No- wheezing.   Skin: No-   rash or lesions. GI:  No-   heartburn, indigestion,  abdominal pain, nausea, vomiting,  GU:  MS:  No-   joint pain or swelling.  . Neuro-     complaint of generalized weakness without myalgias Psych:  No- change in mood or affect. No depression or anxiety.  No memory loss.  General- Alert, Oriented, Affect-appropriate, Distress- none acute Skin- rash-none,  excoriation- none- scab over burn in front of left ear. Lymphadenopathy- none Head- atraumatic            Eyes- Gross vision intact, PERRLA, conjunctivae clear secretions            Ears- Hearing, canals-normal            Nose- Clear, no-Septal dev, mucus, polyps, erosion, perforation             Throat- Mallampati II , mucosa very dry , drainage- none, tonsils- atrophic Neck- flexible , trachea midline, no stridor , thyroid nl, carotid no bruit Chest - symmetrical excursion , unlabored           Heart/CV- RRR , no murmur , no gallop  , no rub, nl s1 s2                           - JVD- none , edema- none, stasis changes- none, varices- none           Lung-  trace crackle in mid-left,  wheeze- none, + deep cough , dullness-none, rub- none           Chest wall-  Abd-  Br/ Gen/ Rectal- Not done, not indicated Extrem- cyanosis- none, clubbing, none, atrophy- none, strength- nl Neuro- grossly intact to observation

## 2011-10-05 NOTE — Assessment & Plan Note (Signed)
Nonspecific complaint today of a chronic symptom. I think it is different from claudication.

## 2011-10-05 NOTE — Assessment & Plan Note (Signed)
Clinically she has had bronchiectasis with chronic cough. She is not sure her that she could cough out any sputum for potential AFB culture. Plan-try tramadol for cough. Biaxin. 

## 2011-10-05 NOTE — Assessment & Plan Note (Signed)
Clinically she has had bronchiectasis with chronic cough. She is not sure her that she could cough out any sputum for potential AFB culture. Plan-try tramadol for cough. Biaxin.

## 2011-11-01 DIAGNOSIS — R300 Dysuria: Secondary | ICD-10-CM | POA: Diagnosis not present

## 2011-11-13 DIAGNOSIS — M545 Low back pain: Secondary | ICD-10-CM | POA: Diagnosis not present

## 2011-11-13 DIAGNOSIS — M76899 Other specified enthesopathies of unspecified lower limb, excluding foot: Secondary | ICD-10-CM | POA: Diagnosis not present

## 2011-11-14 ENCOUNTER — Encounter: Payer: Self-pay | Admitting: Internal Medicine

## 2011-11-14 ENCOUNTER — Ambulatory Visit (INDEPENDENT_AMBULATORY_CARE_PROVIDER_SITE_OTHER): Payer: Medicare Other | Admitting: Internal Medicine

## 2011-11-14 VITALS — BP 136/74 | HR 79 | Ht 68.0 in | Wt 158.8 lb

## 2011-11-14 DIAGNOSIS — J479 Bronchiectasis, uncomplicated: Secondary | ICD-10-CM

## 2011-11-14 DIAGNOSIS — R29818 Other symptoms and signs involving the nervous system: Secondary | ICD-10-CM

## 2011-11-14 NOTE — Patient Instructions (Addendum)
I hope the medrol eases your back pain- be sure to let Dr Nickola Major know if the pain doesn't ease. I would expect the steroid also may help reduce cough and mucus production for awhile.

## 2011-11-14 NOTE — Progress Notes (Signed)
04/09/11- 85 yoF never smoker, followed for bronchiectasis/ chronic bronchitis, complicated by hx of GERD, DM. LOV-May 15, 2010 Has had flu vax. We called in a Zpak for bronchitis in August - she says it helped. She complains of weakness "tired" over the past several weeks- nothing specific- but has difficulty walking around home. Was having left neck pain. Dr Haroldine Laws ENT gave antibiotic. Dr Wyline Beady gave prednisone taper, xray'd neck. She says prednisone helped her neck pain. Tried a heating pad but burned herself. Cough also improved after the prednisone.  Denies fever, but has had some sweat. Weight up, then down. No chest pain and little residual dry cough.  09/30/11- 85 yoF never smoker, followed for bronchiectasis/ chronic bronchitis, complicated by hx of GERD, DM  Patient still c/o cough with clear mucus and sob. She complains of persistent weakness such that it can be hard to get out of bed in the morning. Legs give out. She doesn't describe claudication or exertional chest pain. Weakness is more important than shortness of breath. Scant clear mucus.  CXR 04/17/11 reviewed with her. IMPRESSION:  Stable patchy parenchymal opacities as before. No acute or  superimposed abnormality.  Original Report Authenticated By: Osa Craver, M.D.    11/14/11- 47 yoF never smoker, followed for bronchiectasis/ chronic bronchitis, complicated by hx of GERD, DM Still prod cough w/ clear mucus, feels the biaxin did help.  Was given medrol dose pak by Dr Nickola Major She started Medrol today. She is not focused on her breathing at this visit, being actively uncomfortable with low back and bilateral hip pains. Got a steroid injection yesterday and has tramadol but resists taking pain medications.  ROS-see HPI Constitutional:     No-weight losst, night sweats, fevers, chills, fatigue, lassitude. HEENT:   No-  headaches, difficulty swallowing, tooth/dental problems, sore throat,       No-  sneezing,  itching, ear ache, nasal congestion, post nasal drip,  CV:  No-   chest pain, orthopnea, PND, swelling in lower extremities, anasarca,  dizziness, palpitations Resp: Mild  shortness of breath with exertion or at rest.              +   productive cough,  + non-productive cough,  No- coughing up of blood.              No-   change in color of mucus.  No- wheezing.   Skin: No-   rash or lesions. GI:  No-   heartburn, indigestion, abdominal pain, nausea, vomiting,  GU:  MS:  Active low back and bilateral hip pain Neuro-     complaint of generalized weakness without myalgias Psych:  No- change in mood or affect. No depression or anxiety.  No memory loss.  General- Alert, Oriented, Affect-appropriate, Distress- none acute Skin- rash-none,  excoriation- none Lymphadenopathy- none Head- atraumatic            Eyes- Gross vision intact, PERRLA, conjunctivae clear secretions            Ears- Hearing, canals-normal            Nose- Clear, no-Septal dev, mucus, polyps, erosion, perforation             Throat- Mallampati II , mucosa very dry , drainage- none, tonsils- atrophic Neck- flexible , trachea midline, no stridor , thyroid nl, carotid no bruit Chest - symmetrical excursion , unlabored           Heart/CV- RRR , no murmur ,  no gallop  , no rub, nl s1 s2                           - JVD- none , edema- none, stasis changes- none, varices- none           Lung-  persistent +trace crackle in mid-left, wheeze- none, + little cough , dullness-none, rub- none           Chest wall-  Abd-  Br/ Gen/ Rectal- Not done, not indicated Extrem- + feet are a little dusky,  No-clubbing, none, atrophy- none, strength- nl. She is sitting rather awkwardly because of low back pain and shifts position frequently. Neuro- grossly intact to observation

## 2011-11-20 NOTE — Assessment & Plan Note (Signed)
Known mild chronic bronchiectasis. Recent Biaxin cleared exacerbation and returned her nearly to baseline. She is just starting steroid therapy for back and hip pains under care of her rheumatologist. Steroid therapy will likely improve respiratory symptoms as well.

## 2011-11-20 NOTE — Assessment & Plan Note (Signed)
I asked her to let her rheumatologist know soon if she continues to hurt this much.

## 2011-11-27 DIAGNOSIS — I10 Essential (primary) hypertension: Secondary | ICD-10-CM | POA: Diagnosis not present

## 2011-11-27 DIAGNOSIS — E119 Type 2 diabetes mellitus without complications: Secondary | ICD-10-CM | POA: Diagnosis not present

## 2011-11-27 DIAGNOSIS — E559 Vitamin D deficiency, unspecified: Secondary | ICD-10-CM | POA: Diagnosis not present

## 2011-11-27 DIAGNOSIS — E785 Hyperlipidemia, unspecified: Secondary | ICD-10-CM | POA: Diagnosis not present

## 2011-12-27 ENCOUNTER — Other Ambulatory Visit: Payer: Self-pay | Admitting: Dermatology

## 2011-12-27 DIAGNOSIS — Z85828 Personal history of other malignant neoplasm of skin: Secondary | ICD-10-CM | POA: Diagnosis not present

## 2011-12-27 DIAGNOSIS — D0439 Carcinoma in situ of skin of other parts of face: Secondary | ICD-10-CM | POA: Diagnosis not present

## 2011-12-27 DIAGNOSIS — D485 Neoplasm of uncertain behavior of skin: Secondary | ICD-10-CM | POA: Diagnosis not present

## 2011-12-27 DIAGNOSIS — L570 Actinic keratosis: Secondary | ICD-10-CM | POA: Diagnosis not present

## 2012-01-02 DIAGNOSIS — R300 Dysuria: Secondary | ICD-10-CM | POA: Diagnosis not present

## 2012-01-02 DIAGNOSIS — N952 Postmenopausal atrophic vaginitis: Secondary | ICD-10-CM | POA: Diagnosis not present

## 2012-01-06 DIAGNOSIS — M25559 Pain in unspecified hip: Secondary | ICD-10-CM | POA: Diagnosis not present

## 2012-01-06 DIAGNOSIS — Z23 Encounter for immunization: Secondary | ICD-10-CM | POA: Diagnosis not present

## 2012-01-07 ENCOUNTER — Other Ambulatory Visit: Payer: Self-pay | Admitting: Internal Medicine

## 2012-01-11 DIAGNOSIS — M25559 Pain in unspecified hip: Secondary | ICD-10-CM | POA: Diagnosis not present

## 2012-01-13 DIAGNOSIS — M25559 Pain in unspecified hip: Secondary | ICD-10-CM | POA: Diagnosis not present

## 2012-01-16 ENCOUNTER — Ambulatory Visit (INDEPENDENT_AMBULATORY_CARE_PROVIDER_SITE_OTHER): Payer: Medicare Other | Admitting: Internal Medicine

## 2012-01-16 ENCOUNTER — Encounter: Payer: Self-pay | Admitting: Internal Medicine

## 2012-01-16 VITALS — BP 138/72 | HR 88 | Ht 68.0 in | Wt 157.8 lb

## 2012-01-16 DIAGNOSIS — R05 Cough: Secondary | ICD-10-CM

## 2012-01-16 DIAGNOSIS — J471 Bronchiectasis with (acute) exacerbation: Secondary | ICD-10-CM | POA: Diagnosis not present

## 2012-01-16 DIAGNOSIS — J00 Acute nasopharyngitis [common cold]: Secondary | ICD-10-CM | POA: Diagnosis not present

## 2012-01-16 MED ORDER — METHYLPREDNISOLONE ACETATE 80 MG/ML IJ SUSP
80.0000 mg | Freq: Once | INTRAMUSCULAR | Status: AC
  Administered 2012-01-16: 80 mg via INTRAMUSCULAR

## 2012-01-16 MED ORDER — LEVALBUTEROL HCL 0.63 MG/3ML IN NEBU
0.6300 mg | INHALATION_SOLUTION | Freq: Once | RESPIRATORY_TRACT | Status: AC
  Administered 2012-01-16: 0.63 mg via RESPIRATORY_TRACT

## 2012-01-16 NOTE — Patient Instructions (Addendum)
Neb xop 0.63  Depo 80   We will continue using the ipratropium in your nebulizer machine, since it doesn't cause nervousness  Please call as needed

## 2012-01-16 NOTE — Progress Notes (Signed)
04/09/11- 85 yoF never smoker, followed for bronchiectasis/ chronic bronchitis, complicated by hx of GERD, DM. LOV-May 15, 2010 Has had flu vax. We called in a Zpak for bronchitis in August - she says it helped. She complains of weakness "tired" over the past several weeks- nothing specific- but has difficulty walking around home. Was having left neck pain. Dr Haroldine Laws ENT gave antibiotic. Dr Wyline Beady gave prednisone taper, xray'd neck. She says prednisone helped her neck pain. Tried a heating pad but burned herself. Cough also improved after the prednisone.  Denies fever, but has had some sweat. Weight up, then down. No chest pain and little residual dry cough.  09/30/11- 85 yoF never smoker, followed for bronchiectasis/ chronic bronchitis, complicated by hx of GERD, DM  Patient still c/o cough with clear mucus and sob. She complains of persistent weakness such that it can be hard to get out of bed in the morning. Legs give out. She doesn't describe claudication or exertional chest pain. Weakness is more important than shortness of breath. Scant clear mucus.  CXR 04/17/11 reviewed with her. IMPRESSION:  Stable patchy parenchymal opacities as before. No acute or  superimposed abnormality.  Original Report Authenticated By: Osa Craver, M.D.    11/14/11- 78 yoF never smoker, followed for bronchiectasis/ chronic bronchitis, complicated by hx of GERD, DM Still prod cough w/ clear mucus, feels the biaxin did help.  Was given medrol dose pak by Dr Nickola Major She started Medrol today. She is not focused on her breathing at this visit, being actively uncomfortable with low back and bilateral hip pains. Got a steroid injection yesterday and has tramadol but resists taking pain medications.  01/16/12- 85 yoF never smoker, followed for bronchiectasis/ chronic bronchitis, complicated by hx of GERD, DM Cough-productive-clear in color; wheezing, feels as though not moving enough air. Had flu  vaccine Increased cough with clear mucus. Sometimes back pain catches her breath. Pro air is not enough. Taking one half tramadol for cough or pain but it can make her queasy. Does have some Tussionex at home. Occasionally uses her nebulizer with ipratropium. She has been very sensitive to stimulants. Watery nose-prefers Kleenex over medication. Back pain drags her down- going to orthopedist.  ROS-see HPI Constitutional:     No-weight losst, night sweats, fevers, chills, fatigue, lassitude. HEENT:   No-  headaches, difficulty swallowing, tooth/dental problems, sore throat,       No-  sneezing, itching, ear ache, nasal congestion, post nasal drip,  CV:  No-   chest pain, orthopnea, PND, swelling in lower extremities, anasarca,  dizziness, palpitations Resp: +Mild  shortness of breath with exertion or at rest.              +   productive cough,  + non-productive cough,  No- coughing up of blood.              No-   change in color of mucus.  No- wheezing.   Skin: No-   rash or lesions. GI:  No-   heartburn, indigestion, abdominal pain, nausea, vomiting,  GU:  MS:  +Active low back and bilateral hip pain Neuro-     complaint of generalized weakness without myalgias Psych:  No- change in mood or affect. No depression or anxiety.  No memory loss.  General- Alert, Oriented, Affect-appropriate, Distress- none acute. Looks tired Skin- rash-none,  excoriation- none Lymphadenopathy- none Head- atraumatic            Eyes- Gross vision intact, PERRLA,  conjunctivae clear secretions            Ears- Hearing, canals-normal            Nose- Clear, no-Septal dev, mucus, polyps, erosion, perforation             Throat- Mallampati II , mucosa very dry , drainage- none, tonsils- atrophic Neck- flexible , trachea midline, no stridor , thyroid nl, carotid no bruit Chest - symmetrical excursion , unlabored           Heart/CV- RRR , no murmur , no gallop  , no rub, nl s1 s2                           - JVD-  none , edema- none, stasis changes- none, varices- none           Lung-  persistent +trace crackle in mid-left, wheeze- none, + dry cough , dullness-none, rub- none           Chest wall-  Abd-  Br/ Gen/ Rectal- Not done, not indicated Extrem- + feet are a little dusky,  No-clubbing, none, atrophy- none, strength- nl for age.  Neuro- grossly intact to observation

## 2012-01-21 DIAGNOSIS — N3000 Acute cystitis without hematuria: Secondary | ICD-10-CM | POA: Diagnosis not present

## 2012-01-21 DIAGNOSIS — R300 Dysuria: Secondary | ICD-10-CM | POA: Diagnosis not present

## 2012-01-22 DIAGNOSIS — M25559 Pain in unspecified hip: Secondary | ICD-10-CM | POA: Diagnosis not present

## 2012-01-25 NOTE — Assessment & Plan Note (Signed)
Mild exacerbation of known chronic bronchiectasis. This is not purulent and there is no fever. Musculoskeletal back pain is interfering more with her breathing. She remains very sensitive to stimulants bronchodilators and avoids them, but she can use her rescue albuterol inhaler occasionally. Last chest x-ray was in December. We do not have a current PFT.

## 2012-01-25 NOTE — Assessment & Plan Note (Signed)
She would rather put up with it, blowing her nose, Rather than take medication

## 2012-03-26 ENCOUNTER — Ambulatory Visit: Payer: Medicare Other | Admitting: Internal Medicine

## 2012-04-28 DIAGNOSIS — N390 Urinary tract infection, site not specified: Secondary | ICD-10-CM | POA: Diagnosis not present

## 2012-04-28 DIAGNOSIS — R300 Dysuria: Secondary | ICD-10-CM | POA: Diagnosis not present

## 2012-05-01 ENCOUNTER — Telehealth: Payer: Self-pay | Admitting: Internal Medicine

## 2012-05-01 MED ORDER — IPRATROPIUM BROMIDE 0.02 % IN SOLN: RESPIRATORY_TRACT | Status: DC

## 2012-05-01 NOTE — Telephone Encounter (Signed)
Refill has been sent to the pharmacy.  i have attempted to call the pt but the phone line keeps popping and then it sounds like it hangs up.  Nothing further is needed.

## 2012-05-01 NOTE — Telephone Encounter (Signed)
Error.Katie Alvarado ° °

## 2012-05-19 DIAGNOSIS — R300 Dysuria: Secondary | ICD-10-CM | POA: Diagnosis not present

## 2012-05-19 DIAGNOSIS — M25559 Pain in unspecified hip: Secondary | ICD-10-CM | POA: Diagnosis not present

## 2012-05-19 DIAGNOSIS — N390 Urinary tract infection, site not specified: Secondary | ICD-10-CM | POA: Diagnosis not present

## 2012-05-28 ENCOUNTER — Encounter: Payer: Self-pay | Admitting: Internal Medicine

## 2012-05-28 ENCOUNTER — Ambulatory Visit (INDEPENDENT_AMBULATORY_CARE_PROVIDER_SITE_OTHER)
Admission: RE | Admit: 2012-05-28 | Discharge: 2012-05-28 | Disposition: A | Discharge status: Home / Self Care | Payer: Medicare Other | Source: Ambulatory Visit | Attending: Internal Medicine | Admitting: Internal Medicine

## 2012-05-28 ENCOUNTER — Telehealth: Payer: Self-pay | Admitting: Internal Medicine

## 2012-05-28 ENCOUNTER — Ambulatory Visit (INDEPENDENT_AMBULATORY_CARE_PROVIDER_SITE_OTHER): Payer: Medicare Other | Admitting: Internal Medicine

## 2012-05-28 VITALS — BP 118/64 | HR 81 | Ht 68.0 in | Wt 158.2 lb

## 2012-05-28 DIAGNOSIS — J42 Unspecified chronic bronchitis: Secondary | ICD-10-CM

## 2012-05-28 DIAGNOSIS — J471 Bronchiectasis with (acute) exacerbation: Secondary | ICD-10-CM

## 2012-05-28 MED ORDER — IPRATROPIUM BROMIDE 0.02 % IN SOLN: RESPIRATORY_TRACT | Status: DC

## 2012-05-28 MED ORDER — BUDESONIDE 0.25 MG/2ML IN SUSP: 0.2500 mg | Freq: Every day | RESPIRATORY_TRACT | Status: DC

## 2012-05-28 NOTE — Progress Notes (Signed)
04/09/11- 85 yoF never smoker, followed for bronchiectasis/ chronic bronchitis, complicated by hx of GERD, DM. LOV-May 15, 2010 Has had flu vax. We called in a Zpak for bronchitis in August - she says it helped. She complains of weakness "tired" over the past several weeks- nothing specific- but has difficulty walking around home. Was having left neck pain. Dr Haroldine Laws ENT gave antibiotic. Dr Wyline Beady gave prednisone taper, xray'd neck. She says prednisone helped her neck pain. Tried a heating pad but burned herself. Cough also improved after the prednisone.  Denies fever, but has had some sweat. Weight up, then down. No chest pain and little residual dry cough.  09/30/11- 85 yoF never smoker, followed for bronchiectasis/ chronic bronchitis, complicated by hx of GERD, DM  Patient still c/o cough with clear mucus and sob. She complains of persistent weakness such that it can be hard to get out of bed in the morning. Legs give out. She doesn't describe claudication or exertional chest pain. Weakness is more important than shortness of breath. Scant clear mucus.  CXR 04/17/11 reviewed with her. IMPRESSION:  Stable patchy parenchymal opacities as before. No acute or  superimposed abnormality.  Original Report Authenticated By: Osa Craver, M.D.    11/14/11- 22 yoF never smoker, followed for bronchiectasis/ chronic bronchitis, complicated by hx of GERD, DM Still prod cough w/ clear mucus, feels the biaxin did help.  Was given medrol dose pak by Dr Nickola Major She started Medrol today. She is not focused on her breathing at this visit, being actively uncomfortable with low back and bilateral hip pains. Got a steroid injection yesterday and has tramadol but resists taking pain medications.  01/16/12- 85 yoF never smoker, followed for bronchiectasis/ chronic bronchitis, complicated by hx of GERD, DM Cough-productive-clear in color; wheezing, feels as though not moving enough air. Had flu  vaccine Increased cough with clear mucus. Sometimes back pain catches her breath. Pro air is not enough. Taking one half tramadol for cough or pain but it can make her queasy. Does have some Tussionex at home. Occasionally uses her nebulizer with ipratropium. She has been very sensitive to stimulants. Watery nose-prefers Kleenex over medication. Back pain drags her down- going to orthopedist.  05/28/12- 85 yoF never smoker, followed for bronchiectasis/ chronic bronchitis, complicated by hx of GERD, DM FOLLOWS FOR: unable to catch breath at times; wheezing, SOB, and cough--productive at times-clear in color. Ortho injected hip- limits activity less than when last here.Had dental extraction and on "penicillin" for UTI. Using neb atrovent. Clear sputum, chronic cough. Uses Proair but makes her shake. Considering cosmetic surgery.   ROS-see HPI Constitutional:     No-weight losst, night sweats, fevers, chills, fatigue, lassitude. HEENT:   No-  headaches, difficulty swallowing, tooth/dental problems, sore throat,       No-  sneezing, itching, ear ache, nasal congestion, post nasal drip,  CV:  No-   chest pain, orthopnea, PND, swelling in lower extremities, anasarca,  dizziness, palpitations Resp: +Mild  shortness of breath with exertion or at rest.              +   productive cough,  + non-productive cough,  No- coughing up of blood.              No-   change in color of mucus.  No- wheezing.   Skin: No-   rash or lesions. GI:  No-   heartburn, indigestion, abdominal pain, nausea, vomiting,  GU:  MS:  +Active low  back and bilateral hip pain Neuro-     complaint of generalized weakness without myalgias Psych:  No- change in mood or affect. No depression or anxiety.  No memory loss.  General- Alert, Oriented, Affect-appropriate, Distress- none acute.  Skin- rash-none,  excoriation- none Lymphadenopathy- none Head- atraumatic            Eyes- Gross vision intact, PERRLA, conjunctivae clear  secretions            Ears- Hearing, canals-normal            Nose- Clear, no-Septal dev, mucus, polyps, erosion, perforation             Throat- Mallampati II , mucosa very dry , drainage- none, tonsils- atrophic Neck- flexible , trachea midline, no stridor , thyroid nl, carotid no bruit Chest - symmetrical excursion , unlabored           Heart/CV- RRR , no murmur , no gallop  , no rub, nl s1 s2                           - JVD- none , edema- none, stasis changes- none, varices- none           Lung-  + raspy cough, unlabored, no wheeze , dullness-none, rub- none           Chest wall-  Abd-  Br/ Gen/ Rectal- Not done, not indicated Extrem- + feet are a little dusky,  No-clubbing, none, atrophy- none, strength- nl for age.  Neuro- grossly intact to observation

## 2012-05-28 NOTE — Patient Instructions (Addendum)
Order- CXR   D[facility]  [facility]  Dx Chronic bronchitis    script written                              Budesonide neb solution to use through nebulizer every day

## 2012-05-28 NOTE — Assessment & Plan Note (Signed)
Cough is somewhat worse than baseline. Not toxic.  Plan- She can get neb meds through her Medicare/ DME. Will continue ipratropium, add budesonide and get CXR. Watch for need for antibiotic. She does not tolerate much stimulant bronchodilator.

## 2012-05-28 NOTE — Telephone Encounter (Signed)
I called to discuss CXR showing increased nodularity suggestive of progressive bronchiectasis/ chronic bronchitis c/w atypical infection. She coughs a lot, but brings up very little which has been issue when we wanted to culture before. She agrees to CT.

## 2012-05-29 ENCOUNTER — Other Ambulatory Visit: Payer: Self-pay | Admitting: Internal Medicine

## 2012-05-29 DIAGNOSIS — J479 Bronchiectasis, uncomplicated: Secondary | ICD-10-CM

## 2012-05-29 DIAGNOSIS — R918 Other nonspecific abnormal finding of lung field: Secondary | ICD-10-CM

## 2012-05-29 NOTE — Progress Notes (Signed)
Quick Note:  Pt aware of order placed. [facility] will call patient will appointment date and time. ______

## 2012-06-01 ENCOUNTER — Ambulatory Visit (INDEPENDENT_AMBULATORY_CARE_PROVIDER_SITE_OTHER)
Admission: RE | Admit: 2012-06-01 | Discharge: 2012-06-01 | Disposition: A | Discharge status: Home / Self Care | Payer: Medicare Other | Source: Ambulatory Visit | Attending: Internal Medicine | Admitting: Internal Medicine

## 2012-06-01 ENCOUNTER — Telehealth: Payer: Self-pay | Admitting: Internal Medicine

## 2012-06-01 DIAGNOSIS — J479 Bronchiectasis, uncomplicated: Secondary | ICD-10-CM | POA: Diagnosis not present

## 2012-06-01 DIAGNOSIS — R918 Other nonspecific abnormal finding of lung field: Secondary | ICD-10-CM

## 2012-06-01 NOTE — Telephone Encounter (Signed)
Received a phone call form [place] in [place].  Would like Dr. Maple Hudson to look at patient scan. Patient no longer at scan has gone home, but radiologist would like Dr. Maple Hudson to take a look. Will print off telephone msg for CY and forward to [facility] as well.

## 2012-06-08 NOTE — Telephone Encounter (Signed)
Per CY-he would like to sit down at OV with her and review in detail and answer any questions or concerns the patient may have. I spoke with patient and she will be here Tuesday 06-09-12 at 10:00am.

## 2012-06-08 NOTE — Telephone Encounter (Signed)
Pt called back re: results. Katie Alvarado

## 2012-06-09 ENCOUNTER — Encounter: Payer: Self-pay | Admitting: Internal Medicine

## 2012-06-09 ENCOUNTER — Ambulatory Visit (INDEPENDENT_AMBULATORY_CARE_PROVIDER_SITE_OTHER): Payer: Medicare Other | Admitting: Internal Medicine

## 2012-06-09 ENCOUNTER — Other Ambulatory Visit: Payer: Medicare Other

## 2012-06-09 VITALS — BP 140/82 | HR 78 | Ht 68.0 in | Wt 156.6 lb

## 2012-06-09 DIAGNOSIS — J479 Bronchiectasis, uncomplicated: Secondary | ICD-10-CM | POA: Diagnosis not present

## 2012-06-09 DIAGNOSIS — J471 Bronchiectasis with (acute) exacerbation: Secondary | ICD-10-CM

## 2012-06-09 NOTE — Patient Instructions (Addendum)
Neb saline for sputum induction  Sputum C&S for routine, fungal and AFB

## 2012-06-09 NOTE — Progress Notes (Signed)
04/09/11- 85 yoF never smoker, followed for bronchiectasis/ chronic bronchitis, complicated by hx of GERD, DM. LOV-May 15, 2010 Has had flu vax. We called in a Zpak for bronchitis in August - she says it helped. She complains of weakness "tired" over the past several weeks- nothing specific- but has difficulty walking around home. Was having left neck pain. Dr Ernesto Rutherford ENT gave antibiotic. Dr Lawernce Pitts gave prednisone taper, xray'd neck. She says prednisone helped her neck pain. Tried a heating pad but burned herself. Cough also improved after the prednisone.  Denies fever, but has had some sweat. Weight up, then down. No chest pain and little residual dry cough.  09/30/11- 85 yoF never smoker, followed for bronchiectasis/ chronic bronchitis, complicated by hx of GERD, DM  Patient still c/o cough with clear mucus and sob. She complains of persistent weakness such that it can be hard to get out of bed in the morning. Legs give out. She doesn't describe claudication or exertional chest pain. Weakness is more important than shortness of breath. Scant clear mucus.  CXR 04/17/11 reviewed with her. IMPRESSION:  Stable patchy parenchymal opacities as before. No acute or  superimposed abnormality.  Original Report Authenticated By: Trecia Rogers, M.D.    11/14/11- 60 yoF never smoker, followed for bronchiectasis/ chronic bronchitis, complicated by hx of GERD, DM Still prod cough w/ clear mucus, feels the biaxin did help.  Was given medrol dose pak by Dr Trudie Reed She started Medrol today. She is not focused on her breathing at this visit, being actively uncomfortable with low back and bilateral hip pains. Got a steroid injection yesterday and has tramadol but resists taking pain medications.  01/16/12- 85 yoF never smoker, followed for bronchiectasis/ chronic bronchitis, complicated by hx of GERD, DM Cough-productive-clear in color; wheezing, feels as though not moving enough air. Had flu  vaccine Increased cough with clear mucus. Sometimes back pain catches her breath. Pro air is not enough. Taking one half tramadol for cough or pain but it can make her queasy. Does have some Tussionex at home. Occasionally uses her nebulizer with ipratropium. She has been very sensitive to stimulants. Watery nose-prefers Kleenex over medication. Back pain drags her down- going to orthopedist.  05/28/12- 85 yoF never smoker, followed for bronchiectasis/ chronic bronchitis, complicated by hx of GERD, DM FOLLOWS FOR: unable to catch breath at times; wheezing, SOB, and cough--productive at times-clear in color. Ortho injected hip- limits activity less than when last here.Had dental extraction and on "penicillin" for UTI. Using neb atrovent. Clear sputum, chronic cough. Uses Proair but makes her shake. Considering cosmetic surgery.  06/09/12- 85 yoF never smoker, followed for bronchiectasis/ chronic bronchitis, complicated by hx of GERD, DM FOLLOWS FOR: Per CY-review CT results in person with patient She returns, with her granddaughter, at my request so that I can go over her CT scan. This shows significant fibrocavitary scarring and nodules which could be quite consistent with MAIC. She has a very long history of bronchiectasis which had been fairly stable in the left lower lobe for many years, but this is more extensive and bilateral. I do not get a history that she has been aspirating. She denies night sweats and coughs up almost nothing. CT chest 06/01/12- IMPRESSION:  Progressive bilateral nodular bronchiectasis. Associated nodules  have increased in size including mass-like nodule within the right  lower lobe measuring up to 1.2 cm. It is possible these changes  represent findings of Mycobacterium avium intracellulare. Tumor  difficult to exclude.  Prominent atherosclerotic type changes as detailed above.  This is a call report.  Original Report Authenticated By: Lacy Duverney, M.D.   ROS-see  HPI Constitutional:     No-weight losst, night sweats, fevers, chills, fatigue, lassitude. HEENT:   No-  headaches, difficulty swallowing, tooth/dental problems, sore throat,       No-  sneezing, itching, ear ache, nasal congestion, post nasal drip,  CV:  No-   chest pain, orthopnea, PND, swelling in lower extremities, anasarca,  dizziness, palpitations Resp: +Mild  shortness of breath with exertion or at rest.              +   productive cough,  + non-productive cough,  No- coughing up of blood.              No-   change in color of mucus.  No- wheezing.   Skin: No-   rash or lesions. GI:  No-   heartburn, indigestion, abdominal pain, nausea, vomiting,  GU:  MS:  +Active low back and bilateral hip pain Neuro-     complaint of generalized weakness without myalgias Psych:  No- change in mood or affect. No depression or anxiety.  No memory loss.  General- Alert, Oriented, Affect-appropriate, Distress- none acute.  Skin- rash-none,  excoriation- none Lymphadenopathy- none Head- atraumatic            Eyes- Gross vision intact, PERRLA, conjunctivae clear secretions            Ears- Hearing, canals-normal            Nose- Clear, no-Septal dev, mucus, polyps, erosion, perforation             Throat- Mallampati II , mucosa very dry , drainage- none, tonsils- atrophic Neck- flexible , trachea midline, no stridor , thyroid nl, carotid no bruit Chest - symmetrical excursion , unlabored           Heart/CV- RRR , no murmur , no gallop  , no rub, nl s1 s2                           - JVD- none , edema- none, stasis changes- none, varices- none           Lung-  + raspy cough, unlabored, no wheeze , dullness-none, rub- none. +few rhonchi                               left mid lung zone.                             Neb Rx today with saline for sputum induction triggered hard, rattling, almost                              dry cough.           Chest wall-  Abd-  Br/ Gen/ Rectal- Not done, not  indicated Extrem- + feet are a little dusky,  No-clubbing, none, atrophy- none, strength- nl for age.  Neuro- grossly intact to observation

## 2012-06-10 ENCOUNTER — Telehealth: Payer: Self-pay | Admitting: Internal Medicine

## 2012-06-10 MED ORDER — ALBUTEROL SULFATE HFA 108 (90 BASE) MCG/ACT IN AERS: INHALATION_SPRAY | RESPIRATORY_TRACT | Status: DC

## 2012-06-10 NOTE — Assessment & Plan Note (Signed)
We reviewed these images together. 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 Progressed slowly over many years. If we're going to treat it, she is not getting any younger and even now, may not tolerate effective therapy. With her bad back, I am concerned about having her climb up and down from bronchoscopy table. I asked one of my associates to review her CT and he agrees with my impression. In many circumstances she would not be treated at all as I explained the patient and her granddaughter. Plan-sputum for cultures. May wait for results, but considering triple therapy with zith/ EMB/ Rifabutin as an option.

## 2012-06-10 NOTE — Telephone Encounter (Signed)
I spoke with pt. She was calling to make sure they were able to run test on her sputum she left yesterday. Looked in pt chart and it showed lab was able to. Nothing further was needed

## 2012-06-10 NOTE — Telephone Encounter (Signed)
Received refill request from pharmacy for proair. Rx has been sent. Nothing further was needed

## 2012-06-12 LAB — RESPIRATORY CULTURE OR RESPIRATORY AND SPUTUM CULTURE
Gram Stain: NONE SEEN
Organism ID, Bacteria: NORMAL

## 2012-06-18 NOTE — Progress Notes (Signed)
Quick Note:  Pt aware of results. ______ 

## 2012-06-25 DIAGNOSIS — Z85828 Personal history of other malignant neoplasm of skin: Secondary | ICD-10-CM | POA: Diagnosis not present

## 2012-06-25 DIAGNOSIS — L821 Other seborrheic keratosis: Secondary | ICD-10-CM | POA: Diagnosis not present

## 2012-07-07 LAB — FUNGUS CULTURE W SMEAR: Smear Result: NONE SEEN

## 2012-07-14 ENCOUNTER — Ambulatory Visit (INDEPENDENT_AMBULATORY_CARE_PROVIDER_SITE_OTHER): Payer: Medicare Other | Admitting: Internal Medicine

## 2012-07-14 ENCOUNTER — Encounter: Payer: Self-pay | Admitting: Internal Medicine

## 2012-07-14 ENCOUNTER — Other Ambulatory Visit (INDEPENDENT_AMBULATORY_CARE_PROVIDER_SITE_OTHER): Payer: Medicare Other

## 2012-07-14 VITALS — BP 122/62 | HR 90 | Ht 68.0 in | Wt 158.8 lb

## 2012-07-14 DIAGNOSIS — J479 Bronchiectasis, uncomplicated: Secondary | ICD-10-CM

## 2012-07-14 LAB — CBC WITH DIFFERENTIAL/PLATELET
Basophils Absolute: 0.1 10*3/uL (ref 0.0–0.1)
Eosinophils Absolute: 0.4 10*3/uL (ref 0.0–0.7)
HCT: 35.4 % — ABNORMAL LOW (ref 36.0–46.0)
Hemoglobin: 12.1 g/dL (ref 12.0–15.0)
Lymphs Abs: 2.2 10*3/uL (ref 0.7–4.0)
MCHC: 34 g/dL (ref 30.0–36.0)
Monocytes Relative: 10.2 % (ref 3.0–12.0)
Neutro Abs: 4.8 10*3/uL (ref 1.4–7.7)
RDW: 13.4 % (ref 11.5–14.6)

## 2012-07-14 LAB — BASIC METABOLIC PANEL
CO2: 29 mEq/L (ref 19–32)
Calcium: 9.4 mg/dL (ref 8.4–10.5)
Creatinine, Ser: 0.8 mg/dL (ref 0.4–1.2)

## 2012-07-14 LAB — HEPATIC FUNCTION PANEL
Bilirubin, Direct: 0.1 mg/dL (ref 0.0–0.3)
Total Protein: 7.1 g/dL (ref 6.0–8.3)

## 2012-07-14 MED ORDER — RIFABUTIN 150 MG PO CAPS: ORAL_CAPSULE | ORAL | Status: DC

## 2012-07-14 MED ORDER — CLARITHROMYCIN 500 MG PO TABS: ORAL_TABLET | ORAL | Status: DC

## 2012-07-14 MED ORDER — ETHAMBUTOL HCL 400 MG PO TABS: ORAL_TABLET | ORAL | Status: DC

## 2012-07-14 MED ORDER — HYDROCODONE-HOMATROPINE 5-1.5 MG/5ML PO SYRP: 5.0000 mL | ORAL_SOLUTION | Freq: Four times a day (QID) | ORAL | Status: DC | PRN

## 2012-07-14 NOTE — Patient Instructions (Addendum)
Order- lab- CBC, BMET, Liver panel      Dx bronchiectasis  Scripts for Biaxin/ clarithromycin to take 500 mg        3 days per week        E.g.  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 The treatment takes a very long time, usually several months to a year  Stop the pulmicort/ budesonide nebulizer solution for now  These medicines MIGHT boost the effect of your xanax/ alprazolam, and the Vesicare. If you notice problems please let me know  Script for cough syrup

## 2012-07-14 NOTE — Progress Notes (Signed)
04/09/11- 85 yoF never smoker, followed for bronchiectasis/ chronic bronchitis, complicated by hx of GERD, DM. LOV-May 15, 2010 Has had flu vax. We called in a Zpak for bronchitis in August - she says it helped. She complains of weakness "tired" over the past several weeks- nothing specific- but has difficulty walking around home. Was having left neck pain. Dr Ernesto Rutherford ENT gave antibiotic. Dr Lawernce Pitts gave prednisone taper, xray'd neck. She says prednisone helped her neck pain. Tried a heating pad but burned herself. Cough also improved after the prednisone.  Denies fever, but has had some sweat. Weight up, then down. No chest pain and little residual dry cough.  09/30/11- 85 yoF never smoker, followed for bronchiectasis/ chronic bronchitis, complicated by hx of GERD, DM  Patient still c/o cough with clear mucus and sob. She complains of persistent weakness such that it can be hard to get out of bed in the morning. Legs give out. She doesn't describe claudication or exertional chest pain. Weakness is more important than shortness of breath. Scant clear mucus.  CXR 04/17/11 reviewed with her. IMPRESSION:  Stable patchy parenchymal opacities as before. No acute or  superimposed abnormality.  Original Report Authenticated By: Trecia Rogers, M.D.    11/14/11- 60 yoF never smoker, followed for bronchiectasis/ chronic bronchitis, complicated by hx of GERD, DM Still prod cough w/ clear mucus, feels the biaxin did help.  Was given medrol dose pak by Dr Trudie Reed She started Medrol today. She is not focused on her breathing at this visit, being actively uncomfortable with low back and bilateral hip pains. Got a steroid injection yesterday and has tramadol but resists taking pain medications.  01/16/12- 85 yoF never smoker, followed for bronchiectasis/ chronic bronchitis, complicated by hx of GERD, DM Cough-productive-clear in color; wheezing, feels as though not moving enough air. Had flu  vaccine Increased cough with clear mucus. Sometimes back pain catches her breath. Pro air is not enough. Taking one half tramadol for cough or pain but it can make her queasy. Does have some Tussionex at home. Occasionally uses her nebulizer with ipratropium. She has been very sensitive to stimulants. Watery nose-prefers Kleenex over medication. Back pain drags her down- going to orthopedist.  05/28/12- 85 yoF never smoker, followed for bronchiectasis/ chronic bronchitis, complicated by hx of GERD, DM FOLLOWS FOR: unable to catch breath at times; wheezing, SOB, and cough--productive at times-clear in color. Ortho injected hip- limits activity less than when last here.Had dental extraction and on "penicillin" for UTI. Using neb atrovent. Clear sputum, chronic cough. Uses Proair but makes her shake. Considering cosmetic surgery.  06/09/12- 85 yoF never smoker, followed for bronchiectasis/ chronic bronchitis, complicated by hx of GERD, DM FOLLOWS FOR: Per CY-review CT results in person with patient She returns, with her granddaughter, at my request so that I can go over her CT scan. This shows significant fibrocavitary scarring and nodules which could be quite consistent with MAIC. She has a very long history of bronchiectasis which had been fairly stable in the left lower lobe for many years, but this is more extensive and bilateral. I do not get a history that she has been aspirating. She denies night sweats and coughs up almost nothing. CT chest 06/01/12- IMPRESSION:  Progressive bilateral nodular bronchiectasis. Associated nodules  have increased in size including mass-like nodule within the right  lower lobe measuring up to 1.2 cm. It is possible these changes  represent findings of Mycobacterium avium intracellulare. Tumor  difficult to exclude.  Prominent atherosclerotic type changes as detailed above.  This is a call report.  Original Report Authenticated By: Lacy Duverney, M.D.  07/14/12-   85 yoF never smoker, followed for bronchiectasis/ chronic bronchitis/ MAIC, complicated by hx of GERD, DM   Start 07/14/12- Biaxin 500mg  TIW, EMB 1200 TIW, Rifabutin 300 TIW FOLLOWS FOR: review labs (sputum culture) in greater detail with patient; still having cough-productive-clear in color,; SOB and wheezing-worsened by activity. Chronic cough persists, scant clear mucus. Sometimes feels hot or cold with some night sweating. No blood, fever or swollen glands. Chronic back pain comes and goes. Sputum Cx 06/09/12 POS MAIC- discussed in detail with her, including discussion of medications. She has tried tramadol and Perles for cough.  ROS-see HPI Constitutional:     No-weight losst, +night sweats, no-fevers, chills, fatigue, lassitude. HEENT:   No-  headaches, difficulty swallowing, tooth/dental problems, sore throat,       No-  sneezing, itching, ear ache, nasal congestion, post nasal drip,  CV:  No-   chest pain, orthopnea, PND, swelling in lower extremities, anasarca,  dizziness, palpitations Resp: +Mild  shortness of breath with exertion or at rest.              +   productive cough,  + non-productive cough,  No- coughing up of blood.              No-   change in color of mucus.  No- wheezing.   Skin: No-   rash or lesions. GI:  No-   heartburn, indigestion, abdominal pain, nausea, vomiting,  GU:  MS:  +Active low back and bilateral hip pain Neuro-     complaint of generalized weakness without myalgias Psych:  No- change in mood or affect. No depression or anxiety.  No memory loss.  General- Alert, Oriented, Affect-appropriate, Distress- none acute.  Skin- rash-none,  excoriation- none Lymphadenopathy- none Head- atraumatic            Eyes- Gross vision intact, PERRLA, conjunctivae clear secretions            Ears- Hearing, canals-normal            Nose- Clear, no-Septal dev, mucus, polyps, erosion, perforation             Throat- Mallampati II , mucosa very dry , drainage- none,  tonsils- atrophic Neck- flexible , trachea midline, no stridor , thyroid nl, carotid no bruit Chest - symmetrical excursion , unlabored           Heart/CV- RRR , no murmur , no gallop  , no rub, nl s1 s2                           - JVD- none , edema- none, stasis changes- none, varices- none           Lung-  + deep hacking cough, unlabored, no wheeze , dullness-none, rub- none. +few                        rhonchi left mid lung zone. Chest wall-  Abd-  Br/ Gen/ Rectal- Not done, not indicated Extrem- + feet are a little dusky,  No-clubbing, none, atrophy- none, strength- nl for age.  Neuro- grossly intact to observation

## 2012-07-19 NOTE — Assessment & Plan Note (Signed)
She has chronic bronchiectasis with progressive changes on CT consistent with progression of atypical AFB infection. I discussed limited medication tolerance expected in her age group. Plan-triple therapy is beginning. Labs for CBC, broad chemistry panel

## 2012-07-20 ENCOUNTER — Telehealth: Payer: Self-pay | Admitting: Internal Medicine

## 2012-07-20 NOTE — Telephone Encounter (Signed)
Rec'd from [facility] forward 1 page to Dr.Young 07/20/12  °

## 2012-07-27 ENCOUNTER — Emergency Department (HOSPITAL_COMMUNITY)
Admission: EM | Admit: 2012-07-27 | Discharge: 2012-07-27 | Disposition: A | Discharge status: Home / Self Care | Payer: Medicare Other | Source: Home / Self Care | Attending: Emergency Medicine | Admitting: Emergency Medicine

## 2012-07-27 ENCOUNTER — Telehealth: Payer: Self-pay | Admitting: Internal Medicine

## 2012-07-27 ENCOUNTER — Encounter (HOSPITAL_COMMUNITY): Payer: Self-pay | Admitting: Emergency Medicine

## 2012-07-27 ENCOUNTER — Emergency Department (HOSPITAL_COMMUNITY): Payer: Medicare Other

## 2012-07-27 DIAGNOSIS — K219 Gastro-esophageal reflux disease without esophagitis: Secondary | ICD-10-CM | POA: Insufficient documentation

## 2012-07-27 DIAGNOSIS — B961 Klebsiella pneumoniae [K. pneumoniae] as the cause of diseases classified elsewhere: Secondary | ICD-10-CM | POA: Diagnosis not present

## 2012-07-27 DIAGNOSIS — R110 Nausea: Secondary | ICD-10-CM | POA: Insufficient documentation

## 2012-07-27 DIAGNOSIS — R05 Cough: Secondary | ICD-10-CM | POA: Diagnosis not present

## 2012-07-27 DIAGNOSIS — Z79899 Other long term (current) drug therapy: Secondary | ICD-10-CM | POA: Insufficient documentation

## 2012-07-27 DIAGNOSIS — R5381 Other malaise: Secondary | ICD-10-CM | POA: Diagnosis not present

## 2012-07-27 DIAGNOSIS — F411 Generalized anxiety disorder: Secondary | ICD-10-CM | POA: Insufficient documentation

## 2012-07-27 DIAGNOSIS — A310 Pulmonary mycobacterial infection: Secondary | ICD-10-CM | POA: Diagnosis not present

## 2012-07-27 DIAGNOSIS — J479 Bronchiectasis, uncomplicated: Secondary | ICD-10-CM | POA: Diagnosis not present

## 2012-07-27 DIAGNOSIS — Z7982 Long term (current) use of aspirin: Secondary | ICD-10-CM | POA: Insufficient documentation

## 2012-07-27 DIAGNOSIS — N390 Urinary tract infection, site not specified: Secondary | ICD-10-CM | POA: Diagnosis not present

## 2012-07-27 DIAGNOSIS — G4700 Insomnia, unspecified: Secondary | ICD-10-CM | POA: Diagnosis present

## 2012-07-27 DIAGNOSIS — R531 Weakness: Secondary | ICD-10-CM

## 2012-07-27 DIAGNOSIS — E871 Hypo-osmolality and hyponatremia: Secondary | ICD-10-CM | POA: Diagnosis not present

## 2012-07-27 DIAGNOSIS — Z8709 Personal history of other diseases of the respiratory system: Secondary | ICD-10-CM | POA: Insufficient documentation

## 2012-07-27 DIAGNOSIS — E860 Dehydration: Secondary | ICD-10-CM

## 2012-07-27 DIAGNOSIS — I10 Essential (primary) hypertension: Secondary | ICD-10-CM | POA: Diagnosis not present

## 2012-07-27 DIAGNOSIS — R5383 Other fatigue: Secondary | ICD-10-CM | POA: Diagnosis not present

## 2012-07-27 DIAGNOSIS — E119 Type 2 diabetes mellitus without complications: Secondary | ICD-10-CM | POA: Diagnosis present

## 2012-07-27 DIAGNOSIS — A319 Mycobacterial infection, unspecified: Secondary | ICD-10-CM | POA: Diagnosis not present

## 2012-07-27 DIAGNOSIS — Z794 Long term (current) use of insulin: Secondary | ICD-10-CM | POA: Diagnosis not present

## 2012-07-27 LAB — CBC WITH DIFFERENTIAL/PLATELET
Eosinophils Absolute: 0 10*3/uL (ref 0.0–0.7)
Lymphocytes Relative: 40 % (ref 12–46)
Lymphs Abs: 1.7 10*3/uL (ref 0.7–4.0)
Neutro Abs: 2.1 10*3/uL (ref 1.7–7.7)
Neutrophils Relative %: 48 % (ref 43–77)
Platelets: 242 10*3/uL (ref 150–400)
RBC: 4.48 MIL/uL (ref 3.87–5.11)
WBC: 4.3 10*3/uL (ref 4.0–10.5)

## 2012-07-27 LAB — COMPREHENSIVE METABOLIC PANEL
ALT: 13 U/L (ref 0–35)
Alkaline Phosphatase: 60 U/L (ref 39–117)
Chloride: 86 mEq/L — ABNORMAL LOW (ref 96–112)
GFR calc Af Amer: >90 mL/min (ref >90)
Glucose, Bld: 108 mg/dL — ABNORMAL HIGH (ref 70–99)
Potassium: 4.3 mEq/L (ref 3.5–5.1)
Sodium: 126 mEq/L — ABNORMAL LOW (ref 135–145)
Total Protein: 7.1 g/dL (ref 6.0–8.3)

## 2012-07-27 MED ORDER — ONDANSETRON 8 MG PO TBDP: 8.0000 mg | ORAL_TABLET | Freq: Three times a day (TID) | ORAL | Status: DC | PRN

## 2012-07-27 MED ORDER — SODIUM CHLORIDE 0.9 % IV BOLUS (SEPSIS)
1000.0000 mL | Freq: Once | INTRAVENOUS | Status: AC
  Administered 2012-07-27: 1000 mL via INTRAVENOUS

## 2012-07-27 MED ORDER — SODIUM CHLORIDE 0.9 % IV SOLN
Freq: Once | INTRAVENOUS | Status: AC
  Administered 2012-07-27: 15:00:00 via INTRAVENOUS

## 2012-07-27 NOTE — ED Notes (Signed)
Pt discharged home with daughter; pt given and explained all discharge instructions; pt stable at time of discharge

## 2012-07-27 NOTE — ED Notes (Signed)
Patient reports weakness since last Friday with nausea.  Patient denies chest pain but reports headaches.

## 2012-07-27 NOTE — ED Provider Notes (Signed)
History     CSN: [id number]  Arrival date & time 07/27/12  1404   First MD Initiated Contact with Patient 07/27/12 [phone]      Chief Complaint  Patient presents with  . Fatigue     The history is provided by the patient.   patient reports nausea without vomiting over the past several days.  She's developed generalized weakness over the past several days as well.  Today she felt too weak to get up to get out of bed.  She denies unilateral weakness.  No chest pain shortness of breath.  No abdominal pain.  No vomiting.  No diarrhea.  No melena or hematochezia.  No recent falls or trauma.  No exertional shortness of breath.  She denies orthopnea.  Family reports baseline mental status.  No confusion or altered mental status.  No fevers or chills through the weekend.  Her symptoms are mild to moderate in severity.  Normally she ambulates with a cane and today she needed a significant amount of increased assistance per the daughter.  Past Medical History  Diagnosis Date  . Diabetes mellitus   . Other symptoms involving nervous and musculoskeletal systems   . Insomnia, unspecified   . Cough   . Bronchiectasis with acute exacerbation   . Acute nasopharyngitis (common cold)   . Esophageal reflux     Past Surgical History  Procedure Laterality Date  . Foot    . Abdominal hysterectomy    . Colon surgery      Family History  Problem Relation Age of Onset  . Chronic bronchitis Father   . Other Sister     heart trouble  . Other Brother     heart trouble    History  Substance Use Topics  . Smoking status: Never Smoker   . Smokeless tobacco: Not on file  . Alcohol Use: No    OB History   Grav Para Term Preterm Abortions TAB SAB Ect Mult Living                  Review of Systems  All other systems reviewed and are negative.    Allergies  Welchol; Glucosamine; and Celecoxib  Home Medications   Current Outpatient Rx  Name  Route  Sig  Dispense  Refill  . albuterol  (PROAIR HFA) 108 (90 BASE) MCG/ACT inhaler      INHALE 2 PUFFS BY MOUTH EVERY 4 HOURS AS NEEDED FOR SHORTNESS OF BREATH   1 Inhaler   3     THANK YOU   . allopurinol (ZYLOPRIM) 100 MG tablet   Oral   Take 100 mg by mouth daily.           Marland Kitchen ALPRAZolam (XANAX) 0.25 MG tablet   Oral   Take 0.25 mg by mouth 3 (three) times daily as needed. For anxiety.         Marland Kitchen aspirin 81 MG tablet   Oral   Take 81 mg by mouth daily.           . budesonide (PULMICORT) 0.25 MG/2ML nebulizer solution   Nebulization   Take 2 mLs (0.25 mg total) by nebulization daily.   60 mL   12   . cholecalciferol (VITAMIN D) 1000 UNITS tablet   Oral   Take 2,000 Units by mouth daily.           . Cinnamon 500 MG capsule   Oral   Take 500 mg by mouth daily.         Marland Kitchen  clarithromycin (BIAXIN) 500 MG tablet   Oral   Take 500 mg by mouth 3 (three) times a week. Monday, Wednesday, and Friday.         . esomeprazole (NEXIUM) 40 MG capsule   Oral   Take 40 mg by mouth daily.           Marland Kitchen ethambutol (MYAMBUTOL) 400 MG tablet   Oral   Take 1,200 mg by mouth 3 (three) times a week. Monday, Weds, Friday         . ipratropium (ATROVENT) 0.02 % nebulizer solution   Nebulization   Take 500 mcg by nebulization 4 (four) times daily as needed.         Marland Kitchen losartan (COZAAR) 25 MG tablet   Oral   Take 12.5 mg by mouth daily.          . meclizine (ANTIVERT) 25 MG tablet   Oral   Take 25 mg by mouth 3 (three) times daily as needed. For dizziness.         . metFORMIN (GLUCOPHAGE) 500 MG tablet   Oral   Take 750 mg by mouth 2 (two) times daily.          . Omega-3 Fatty Acids (FISH OIL) 1000 MG CAPS   Oral   Take 1 capsule by mouth daily.         . ONE TOUCH ULTRA TEST test strip      Use as directed to check blood glucose         . rifabutin (MYCOBUTIN) 150 MG capsule   Oral   Take 300 mg by mouth 3 (three) times a week. 2 capsules ( 300 mg total) every Monday, Weds, Friday          . traMADol (ULTRAM) 50 MG tablet   Oral   Take 50-100 mg by mouth every 6 (six) hours as needed (for cough).         . triamterene-hydrochlorothiazide (MAXZIDE) 75-50 MG per tablet   Oral   Take 0.5 tablets by mouth daily.          . vitamin E 400 UNIT capsule   Oral   Take 400 Units by mouth daily.         Marland Kitchen HYDROcodone-homatropine (HYCODAN) 5-1.5 MG/5ML syrup   Oral   Take 5 mLs by mouth every 6 (six) hours as needed for cough.   180 mL   0   . ondansetron (ZOFRAN ODT) 8 MG disintegrating tablet   Oral   Take 1 tablet (8 mg total) by mouth every 8 (eight) hours as needed for nausea.   10 tablet   0     There were no vitals taken for this visit.  Physical Exam  Nursing note and vitals reviewed. Constitutional: She is oriented to person, place, and time. She appears well-developed and well-nourished. No distress.  HENT:  Head: Normocephalic and atraumatic.  Dry mucous membranes  Eyes: EOM are normal.  Neck: Normal range of motion.  Cardiovascular: Normal rate, regular rhythm and normal heart sounds.   Pulmonary/Chest: Effort normal and breath sounds normal.  Abdominal: Soft. She exhibits no distension. There is no tenderness.  Musculoskeletal: Normal range of motion.  Neurological: She is alert and oriented to person, place, and time.  Skin: Skin is warm and dry.  Psychiatric: She has a normal mood and affect. Judgment normal.    ED Course  Procedures (including critical care time)   Date: 07/27/2012  Rate: 75  Rhythm: normal sinus rhythm  QRS Axis: normal  Intervals: normal  ST/T Wave abnormalities: normal  Conduction Disutrbances: none  Narrative Interpretation:   Old EKG Reviewed: No significant changes noted     Labs Reviewed  COMPREHENSIVE METABOLIC PANEL - Abnormal; Notable for the following:    Sodium 126 (*)    Chloride 86 (*)    Glucose, Bld 108 (*)    Total Bilirubin 0.2 (*)    GFR calc non Af Amer 80 (*)    All other components  within normal limits  CBC WITH DIFFERENTIAL   Dg Chest 2 View  07/27/2012  *RADIOLOGY REPORT*  Clinical Data: Fatigue, weakness for 3 days  CHEST - 2 VIEW  Comparison: CT chest of 06/01/2012 and chest x-ray of 05/28/2012  Findings: Nodularity in both mid lungs and the left lung base is again noted.  CT has demonstrate that these nodules represent bronchiectasis, and scarring.  No new parenchymal process is seen and no pleural effusion is noted.  Heart size is stable.  No mediastinal or hilar adenopathy is noted.  No bony abnormality is seen.  IMPRESSION: Stable changes of bronchiectasis and scarring.  No definite active process.   Original Report Authenticated By: Dwyane Dee, M.D.    I personally reviewed the imaging tests through PACS system I reviewed available ER/hospitalization records through the EMR   1. Weakness   2. Dehydration       MDM  5:44 PM The patient feels much better after IV fluids.  She is eating dinner tray at this time.  She feels so much better.  Discharge home in good condition.  Close PCP followup.  She understands return to the ER for new or worsening symptoms        Lyanne Co, MD 07/27/12 1746

## 2012-07-27 NOTE — Telephone Encounter (Signed)
Recently started on 3 mds for [facility] w/ chronic bronchiectasis. Lives alone. Called because of profound weakness. Hard to get up and do for herself. Minor nausea from meds but not vomiting. Last time she got like this had electrolyte imbalances requiring hospital. I recommended she call friend or family to take her to ER for evaluation. PCP is Dr Catha Gosselin.

## 2012-07-28 LAB — AFB CULTURE WITH SMEAR (NOT AT ARMC): Acid Fast Smear: NONE SEEN

## 2012-07-29 ENCOUNTER — Inpatient Hospital Stay (HOSPITAL_COMMUNITY)
Admission: EM | Admit: 2012-07-29 | Discharge: 2012-08-01 | DRG: 690 | Disposition: A | Discharge status: Home Health | Payer: Medicare Other | Attending: Internal Medicine | Admitting: Internal Medicine

## 2012-07-29 ENCOUNTER — Encounter (HOSPITAL_COMMUNITY): Payer: Self-pay | Admitting: Emergency Medicine

## 2012-07-29 ENCOUNTER — Emergency Department (HOSPITAL_COMMUNITY): Payer: Medicare Other

## 2012-07-29 DIAGNOSIS — B961 Klebsiella pneumoniae [K. pneumoniae] as the cause of diseases classified elsewhere: Secondary | ICD-10-CM | POA: Diagnosis present

## 2012-07-29 DIAGNOSIS — G4700 Insomnia, unspecified: Secondary | ICD-10-CM | POA: Diagnosis present

## 2012-07-29 DIAGNOSIS — R531 Weakness: Secondary | ICD-10-CM | POA: Diagnosis present

## 2012-07-29 DIAGNOSIS — R5381 Other malaise: Secondary | ICD-10-CM | POA: Diagnosis not present

## 2012-07-29 DIAGNOSIS — A319 Mycobacterial infection, unspecified: Secondary | ICD-10-CM | POA: Diagnosis not present

## 2012-07-29 DIAGNOSIS — E871 Hypo-osmolality and hyponatremia: Secondary | ICD-10-CM | POA: Diagnosis present

## 2012-07-29 DIAGNOSIS — N390 Urinary tract infection, site not specified: Secondary | ICD-10-CM | POA: Diagnosis not present

## 2012-07-29 DIAGNOSIS — Z794 Long term (current) use of insulin: Secondary | ICD-10-CM

## 2012-07-29 DIAGNOSIS — E119 Type 2 diabetes mellitus without complications: Secondary | ICD-10-CM | POA: Diagnosis present

## 2012-07-29 DIAGNOSIS — R059 Cough, unspecified: Secondary | ICD-10-CM | POA: Diagnosis not present

## 2012-07-29 DIAGNOSIS — A498 Other bacterial infections of unspecified site: Secondary | ICD-10-CM | POA: Diagnosis present

## 2012-07-29 DIAGNOSIS — J479 Bronchiectasis, uncomplicated: Secondary | ICD-10-CM | POA: Diagnosis present

## 2012-07-29 DIAGNOSIS — R5383 Other fatigue: Secondary | ICD-10-CM

## 2012-07-29 DIAGNOSIS — A310 Pulmonary mycobacterial infection: Secondary | ICD-10-CM | POA: Diagnosis present

## 2012-07-29 DIAGNOSIS — I10 Essential (primary) hypertension: Secondary | ICD-10-CM | POA: Diagnosis present

## 2012-07-29 DIAGNOSIS — E1165 Type 2 diabetes mellitus with hyperglycemia: Secondary | ICD-10-CM | POA: Diagnosis present

## 2012-07-29 LAB — URINALYSIS, ROUTINE W REFLEX MICROSCOPIC
Glucose, UA: NEGATIVE mg/dL
Hgb urine dipstick: NEGATIVE
Specific Gravity, Urine: 1.014 (ref 1.005–1.030)

## 2012-07-29 LAB — POCT I-STAT TROPONIN I: Troponin i, poc: 0 ng/mL (ref 0.00–0.08)

## 2012-07-29 LAB — CBC WITH DIFFERENTIAL/PLATELET
Basophils Absolute: 0 10*3/uL (ref 0.0–0.1)
Basophils Relative: 1 % (ref 0–1)
Eosinophils Relative: 3 % (ref 0–5)
HCT: 37.3 % (ref 36.0–46.0)
MCHC: 33.8 g/dL (ref 30.0–36.0)
MCV: 85.4 fL (ref 78.0–100.0)
Monocytes Absolute: 0.6 10*3/uL (ref 0.1–1.0)
Neutro Abs: 2 10*3/uL (ref 1.7–7.7)
RDW: 12.8 % (ref 11.5–15.5)

## 2012-07-29 LAB — COMPREHENSIVE METABOLIC PANEL
AST: 22 U/L (ref 0–37)
Albumin: 3.7 g/dL (ref 3.5–5.2)
Calcium: 9.7 mg/dL (ref 8.4–10.5)
Creatinine, Ser: 0.66 mg/dL (ref 0.50–1.10)

## 2012-07-29 LAB — URINE MICROSCOPIC-ADD ON

## 2012-07-29 MED ORDER — BIOTENE DRY MOUTH MT LIQD
15.0000 mL | Freq: Two times a day (BID) | OROMUCOSAL | Status: DC
  Administered 2012-07-30 – 2012-07-31 (×4): 15 mL via OROMUCOSAL

## 2012-07-29 MED ORDER — SODIUM CHLORIDE 0.9 % IV SOLN
INTRAVENOUS | Status: AC
  Administered 2012-07-29: 18:00:00 via INTRAVENOUS

## 2012-07-29 MED ORDER — CLARITHROMYCIN 500 MG PO TABS
500.0000 mg | ORAL_TABLET | ORAL | Status: DC
  Administered 2012-07-29 – 2012-07-31 (×2): 500 mg via ORAL
  Filled 2012-07-29 (×2): qty 1

## 2012-07-29 MED ORDER — CHLORHEXIDINE GLUCONATE 0.12 % MT SOLN
15.0000 mL | Freq: Two times a day (BID) | OROMUCOSAL | Status: DC
  Administered 2012-07-30 – 2012-08-01 (×4): 15 mL via OROMUCOSAL
  Filled 2012-07-29 (×8): qty 15

## 2012-07-29 MED ORDER — DEXTROSE 5 % IV SOLN
1.0000 g | INTRAVENOUS | Status: DC
  Administered 2012-07-30 – 2012-07-31 (×2): 1 g via INTRAVENOUS
  Filled 2012-07-29 (×3): qty 10

## 2012-07-29 MED ORDER — ALLOPURINOL 100 MG PO TABS
100.0000 mg | ORAL_TABLET | Freq: Every day | ORAL | Status: DC
  Administered 2012-07-29 – 2012-07-31 (×3): 100 mg via ORAL
  Filled 2012-07-29 (×5): qty 1

## 2012-07-29 MED ORDER — HYDROCODONE-HOMATROPINE 5-1.5 MG/5ML PO SYRP
5.0000 mL | ORAL_SOLUTION | Freq: Four times a day (QID) | ORAL | Status: DC | PRN
  Administered 2012-07-29 – 2012-08-01 (×6): 5 mL via ORAL
  Filled 2012-07-29 (×6): qty 5

## 2012-07-29 MED ORDER — ENOXAPARIN SODIUM 40 MG/0.4ML ~~LOC~~ SOLN
40.0000 mg | Freq: Every day | SUBCUTANEOUS | Status: DC
  Administered 2012-07-29 – 2012-07-31 (×3): 40 mg via SUBCUTANEOUS
  Filled 2012-07-29 (×4): qty 0.4

## 2012-07-29 MED ORDER — ASPIRIN 81 MG PO CHEW
81.0000 mg | CHEWABLE_TABLET | Freq: Every day | ORAL | Status: DC
  Administered 2012-07-29 – 2012-07-31 (×3): 81 mg via ORAL
  Filled 2012-07-29 (×4): qty 1

## 2012-07-29 MED ORDER — ONDANSETRON HCL 4 MG PO TABS: 4.0000 mg | ORAL_TABLET | Freq: Four times a day (QID) | ORAL | Status: DC | PRN

## 2012-07-29 MED ORDER — INSULIN ASPART 100 UNIT/ML ~~LOC~~ SOLN
0.0000 [IU] | Freq: Three times a day (TID) | SUBCUTANEOUS | Status: DC
  Administered 2012-07-30 (×2): 2 [IU] via SUBCUTANEOUS
  Administered 2012-07-31: 5 [IU] via SUBCUTANEOUS
  Administered 2012-08-01: 2 [IU] via SUBCUTANEOUS

## 2012-07-29 MED ORDER — RIFABUTIN 150 MG PO CAPS
300.0000 mg | ORAL_CAPSULE | ORAL | Status: DC
  Administered 2012-07-31: 300 mg via ORAL
  Filled 2012-07-29 (×2): qty 2

## 2012-07-29 MED ORDER — ETHAMBUTOL HCL 400 MG PO TABS
1200.0000 mg | ORAL_TABLET | ORAL | Status: DC
  Administered 2012-07-29 – 2012-07-31 (×2): 1200 mg via ORAL
  Filled 2012-07-29 (×2): qty 3

## 2012-07-29 MED ORDER — DEXTROSE 5 % IV SOLN
1.0000 g | Freq: Once | INTRAVENOUS | Status: AC
  Administered 2012-07-29: 1 g via INTRAVENOUS
  Filled 2012-07-29: qty 10

## 2012-07-29 MED ORDER — ACETAMINOPHEN 325 MG PO TABS
650.0000 mg | ORAL_TABLET | Freq: Four times a day (QID) | ORAL | Status: DC | PRN
  Administered 2012-07-29 – 2012-07-30 (×3): 650 mg via ORAL
  Filled 2012-07-29 (×3): qty 2

## 2012-07-29 MED ORDER — ALBUTEROL SULFATE (5 MG/ML) 0.5% IN NEBU
2.5000 mg | INHALATION_SOLUTION | RESPIRATORY_TRACT | Status: DC | PRN
  Administered 2012-07-30 – 2012-07-31 (×2): 2.5 mg via RESPIRATORY_TRACT
  Filled 2012-07-29: qty 0.5

## 2012-07-29 MED ORDER — ALPRAZOLAM 0.25 MG PO TABS
0.2500 mg | ORAL_TABLET | Freq: Three times a day (TID) | ORAL | Status: DC | PRN
  Administered 2012-07-29 – 2012-07-31 (×3): 0.25 mg via ORAL
  Filled 2012-07-29 (×3): qty 1

## 2012-07-29 MED ORDER — BUDESONIDE 0.25 MG/2ML IN SUSP
0.2500 mg | Freq: Every day | RESPIRATORY_TRACT | Status: DC
Start: 2012-07-29 — End: 2012-07-29
  Filled 2012-07-29: qty 2

## 2012-07-29 MED ORDER — LOSARTAN POTASSIUM 25 MG PO TABS
12.5000 mg | ORAL_TABLET | Freq: Every day | ORAL | Status: DC
  Administered 2012-07-30 – 2012-07-31 (×2): 12.5 mg via ORAL
  Filled 2012-07-29 (×4): qty 0.5

## 2012-07-29 MED ORDER — SODIUM CHLORIDE 0.9 % IV BOLUS (SEPSIS)
500.0000 mL | Freq: Once | INTRAVENOUS | Status: AC
  Administered 2012-07-29: 500 mL via INTRAVENOUS

## 2012-07-29 MED ORDER — BUDESONIDE 0.25 MG/2ML IN SUSP
0.2500 mg | Freq: Two times a day (BID) | RESPIRATORY_TRACT | Status: DC
  Administered 2012-07-29 – 2012-08-01 (×6): 0.25 mg via RESPIRATORY_TRACT
  Filled 2012-07-29 (×8): qty 2

## 2012-07-29 MED ORDER — ASPIRIN 81 MG PO TABS: 81.0000 mg | ORAL_TABLET | Freq: Every day | ORAL | Status: DC

## 2012-07-29 MED ORDER — PANTOPRAZOLE SODIUM 40 MG PO TBEC
40.0000 mg | DELAYED_RELEASE_TABLET | Freq: Every day | ORAL | Status: DC
  Administered 2012-07-29 – 2012-07-31 (×3): 40 mg via ORAL
  Filled 2012-07-29 (×4): qty 1

## 2012-07-29 MED ORDER — ACETAMINOPHEN 650 MG RE SUPP: 650.0000 mg | Freq: Four times a day (QID) | RECTAL | Status: DC | PRN

## 2012-07-29 MED ORDER — SODIUM CHLORIDE 0.9 % IV SOLN: INTRAVENOUS | Status: DC

## 2012-07-29 MED ORDER — ONDANSETRON HCL 4 MG/2ML IJ SOLN: 4.0000 mg | Freq: Four times a day (QID) | INTRAMUSCULAR | Status: DC | PRN

## 2012-07-29 NOTE — Progress Notes (Signed)
Patient arrived to rm 1302 at 1740, Alert and oriented,c/o weakness, patient placed comfortably in bed.- Hulda Marin RN

## 2012-07-29 NOTE — H&P (Signed)
[facility] History and Physical  Katie Alvarado ZOX:[id number] DOB: 1926-04-09 DOA: 07/29/2012   PCP: Mickie Hillier, MD  Specialists: She is followed by Dr. Jetty Duhamel  Chief Complaint: Weakness since Friday  HPI: Katie Alvarado is a 79 y.o. 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 This was based on a CT scan that was done a few weeks ago. She was started on triple antibiotic treatment by Dr. Maple Hudson on March 26. Patient tells me that since Friday she has had profound weakness. She has found it very difficult to walk and stand. Denies any focal weakness. Denies any falls or dizziness. She tried to stay hydrated by drinking Gatorade. She's had hot and cold sensations. Had some nausea, but no vomiting. Denies any diarrhea or abdominal pain. Denies chest pain. The shortness of breath and cough is chronic. Denies any blood in the sputum. She does have a clear expectoration. She actually presented to the emergency department on Monday. She was hydrated with IV fluids, and was discharged home. However, her symptoms did not improve, and, so, she decided to come back. She also admitted to me that whenever she urinates she has pain and heaviness in the lower abdomen. However, denies any pain with urination, per se. Denies any fever. She had some headache last night without any vision disturbances. She mentioned that she was admitted for similar reasons in 2012. And review of the records show that she was thought to have UTI at that time. She was also on prednisone at that time the dose of which was increased. She is no longer on steroids.  Home Medications: Prior to Admission medications   Medication Sig Start Date End Date Taking? Authorizing Provider  albuterol (PROAIR HFA) 108 (90 BASE) MCG/ACT inhaler INHALE 2 PUFFS BY MOUTH EVERY 4 HOURS AS NEEDED FOR SHORTNESS OF BREATH  06/10/12  Yes Waymon Budge, MD  allopurinol (ZYLOPRIM) 100 MG tablet Take 100 mg by mouth daily.     Yes Historical Provider, MD  ALPRAZolam (XANAX) 0.25 MG tablet Take 0.25 mg by mouth 3 (three) times daily as needed. For anxiety.   Yes Historical Provider, MD  aspirin 81 MG tablet Take 81 mg by mouth daily.     Yes Historical Provider, MD  budesonide (PULMICORT) 0.25 MG/2ML nebulizer solution Take 2 mLs (0.25 mg total) by nebulization daily. 05/28/12 05/28/13 Yes Waymon Budge, MD  cholecalciferol (VITAMIN D) 1000 UNITS tablet Take 2,000 Units by mouth daily.     Yes Historical Provider, MD  Cinnamon 500 MG capsule Take 500 mg by mouth daily.   Yes Historical Provider, MD  clarithromycin (BIAXIN) 500 MG tablet Take 500 mg by mouth 3 (three) times a week. Monday, Wednesday, and Friday. 07/14/12  Yes Waymon Budge, MD  esomeprazole (NEXIUM) 40 MG capsule Take 40 mg by mouth daily.     Yes Historical Provider, MD  ethambutol (MYAMBUTOL) 400 MG tablet Take 1,200 mg by mouth 3 (three) times a week. Monday, Weds, Friday 07/14/12  Yes Clinton D Young, MD  ipratropium (ATROVENT) 0.02 % nebulizer solution Take 500 mcg by nebulization 4 (four) times daily as needed. 05/28/12 05/28/13 Yes Waymon Budge, MD  losartan (COZAAR) 25 MG tablet Take 12.5 mg by mouth daily.    Yes Historical Provider, MD  meclizine (ANTIVERT) 25 MG tablet Take 25 mg by mouth 3 (three) times daily as needed. For  dizziness.   Yes Historical Provider, MD  metFORMIN (GLUCOPHAGE) 500 MG tablet Take 750 mg by mouth 2 (two) times daily.    Yes Historical Provider, MD  Omega-3 Fatty Acids (FISH OIL) 1000 MG CAPS Take 1 capsule by mouth daily.   Yes Historical Provider, MD  ondansetron (ZOFRAN ODT) 8 MG disintegrating tablet Take 1 tablet (8 mg total) by mouth every 8 (eight) hours as needed for nausea. 07/27/12  Yes Lyanne Co, MD  ONE TOUCH ULTRA TEST test strip Use as directed to check blood glucose 09/26/11  Yes Historical Provider, MD   rifabutin (MYCOBUTIN) 150 MG capsule Take 300 mg by mouth 3 (three) times a week. 2 capsules ( 300 mg total) every Monday, Weds, Friday 07/14/12  Yes Waymon Budge, MD  traMADol (ULTRAM) 50 MG tablet Take 50-100 mg by mouth every 6 (six) hours as needed (for cough). 09/30/11 09/29/12 Yes Clinton D Young, MD  triamterene-hydrochlorothiazide (MAXZIDE) 75-50 MG per tablet Take 0.5 tablets by mouth daily.  09/18/11  Yes Historical Provider, MD  vitamin E 400 UNIT capsule Take 400 Units by mouth daily.   Yes Historical Provider, MD  HYDROcodone-homatropine (HYCODAN) 5-1.5 MG/5ML syrup Take 5 mLs by mouth every 6 (six) hours as needed for cough. 07/14/12   Waymon Budge, MD    Allergies:  Allergies  Allergen Reactions  . Welchol (Colesevelam Hcl) Hives  . Glucosamine Other (See Comments)    Unknown  . Celecoxib Hives, Itching and Rash    Past Medical History: Past Medical History  Diagnosis Date  . Diabetes mellitus   . Other symptoms involving nervous and musculoskeletal systems   . Insomnia, unspecified   . Cough   . Bronchiectasis with acute exacerbation   . Acute nasopharyngitis (common cold)   . Esophageal reflux     Past Surgical History  Procedure Laterality Date  . Foot    . Abdominal hysterectomy    . Colon surgery    . Appendectomy      Social History:  reports that she has never smoked. She has never used smokeless tobacco. She reports that she does not drink alcohol or use illicit drugs.  Living Situation: Lives alone Activity Level: Uses a cane to ambulate   Family History:  Family History  Problem Relation Age of Onset  . Chronic bronchitis Father   . Other Sister     heart trouble  . Other Brother     heart trouble     Review of Systems - History obtained from the patient General ROS: positive for  - fatigue Psychological ROS: negative Ophthalmic ROS: negative ENT ROS: negative Allergy and Immunology ROS: negative Hematological and Lymphatic ROS:  negative Endocrine ROS: negative Respiratory ROS: as in hpi Cardiovascular ROS: no chest pain or dyspnea on exertion Gastrointestinal ROS: no abdominal pain, change in bowel habits, or black or bloody stools Genito-Urinary ROS: as in hpi Musculoskeletal ROS: positive for right leg pain at times Neurological ROS: no TIA or stroke symptoms Dermatological ROS: negative  Physical Examination  Filed Vitals:   07/29/12 1257 07/29/12 1524 07/29/12 1524 07/29/12 1526  BP: 130/71 167/87 170/75 159/88  Pulse: 80 73 74 82  Temp: 98.2 F (36.8 C)     TempSrc: Oral     Resp: 20     SpO2: 99%       General appearance: alert, cooperative, appears stated age and no distress Head: Normocephalic, without obvious abnormality, atraumatic Eyes: conjunctivae/corneas clear. PERRL, EOM's intact.  Throat: lips, mucosa, and tongue normal; teeth and gums normal Neck: no adenopathy, no carotid bruit, no JVD, supple, symmetrical, trachea midline and thyroid not enlarged, symmetric, no tenderness/mass/nodules Resp: Mostly clear to auscultation, with decreased air entry at the bases. Coarse breath sounds Cardio: regular rate and rhythm, S1, S2 normal, no murmur, click, rub or gallop GI: Abdomen is soft. There is some tenderness appreciated in the suprapubic area without any rebound, rigidity, or guarding. No masses, or organomegaly speciated. Bowel sounds are present Extremities: No swelling noted in either leg. Patient was having some difficulty lifting the right leg off the bed. There was no abnormality noted in the hip or the knee, which had good range of motion. Pulses: 2+ and symmetric Skin: Skin color, texture, turgor normal. No rashes or lesions Lymph nodes: Cervical, supraclavicular, and axillary nodes normal. Neurologic: She is alert and oriented x3. No cranial nerve deficits. Motor strength equal bilateral upper extremities. Equal, ankle flexors and extensors. She did have some difficulty lifting her  right leg off the bed, which she told me was chronic.  Laboratory Data: Results for orders placed during the hospital encounter of 07/29/12 (from the past 48 hour(s))  CBC WITH DIFFERENTIAL     Status: Abnormal   Collection Time    07/29/12  2:20 PM      Result Value Range   WBC 4.0  4.0 - 10.5 K/uL   RBC 4.37  3.87 - 5.11 MIL/uL   Hemoglobin 12.6  12.0 - 15.0 g/dL   HCT 09.8  11.9 - 14.7 %   MCV 85.4  78.0 - 100.0 fL   MCH 28.8  26.0 - 34.0 pg   MCHC 33.8  30.0 - 36.0 g/dL   RDW 82.9  56.2 - 13.0 %   Platelets 231  150 - 400 K/uL   Neutrophils Relative 51  43 - 77 %   Neutro Abs 2.0  1.7 - 7.7 K/uL   Lymphocytes Relative 31  12 - 46 %   Lymphs Abs 1.2  0.7 - 4.0 K/uL   Monocytes Relative 15 (*) 3 - 12 %   Monocytes Absolute 0.6  0.1 - 1.0 K/uL   Eosinophils Relative 3  0 - 5 %   Eosinophils Absolute 0.1  0.0 - 0.7 K/uL   Basophils Relative 1  0 - 1 %   Basophils Absolute 0.0  0.0 - 0.1 K/uL  COMPREHENSIVE METABOLIC PANEL     Status: Abnormal   Collection Time    07/29/12  2:20 PM      Result Value Range   Sodium 132 (*) 135 - 145 mEq/L   Potassium 4.1  3.5 - 5.1 mEq/L   Chloride 92 (*) 96 - 112 mEq/L   CO2 30  19 - 32 mEq/L   Glucose, Bld 116 (*) 70 - 99 mg/dL   BUN 12  6 - 23 mg/dL   Creatinine, Ser 8.65  0.50 - 1.10 mg/dL   Calcium 9.7  8.4 - 78.4 mg/dL   Total Protein 7.0  6.0 - 8.3 g/dL   Albumin 3.7  3.5 - 5.2 g/dL   AST 22  0 - 37 U/L   ALT 12  0 - 35 U/L   Alkaline Phosphatase 64  39 - 117 U/L   Total Bilirubin 0.2 (*) 0.3 - 1.2 mg/dL   GFR calc non Af Amer 79 (*) >90 mL/min   GFR calc Af Amer >90  >90 mL/min   Comment:  The eGFR has been calculated     using the CKD EPI equation.     This calculation has not been     validated in all clinical     situations.     eGFR's persistently     <90 mL/min signify     possible Chronic Kidney Disease.  POCT I-STAT TROPONIN I     Status: None   Collection Time    07/29/12  2:38 PM      Result Value  Range   Troponin i, poc 0.00  0.00 - 0.08 ng/mL   Comment 3            Comment: Due to the release kinetics of cTnI,     a negative result within the first hours     of the onset of symptoms does not rule out     myocardial infarction with certainty.     If myocardial infarction is still suspected,     repeat the test at appropriate intervals.  URINALYSIS, ROUTINE W REFLEX MICROSCOPIC     Status: Abnormal   Collection Time    07/29/12  2:48 PM      Result Value Range   Color, Urine YELLOW  YELLOW   APPearance CLEAR  CLEAR   Specific Gravity, Urine 1.014  1.005 - 1.030   pH 7.5  5.0 - 8.0   Glucose, UA NEGATIVE  NEGATIVE mg/dL   Hgb urine dipstick NEGATIVE  NEGATIVE   Bilirubin Urine NEGATIVE  NEGATIVE   Ketones, ur NEGATIVE  NEGATIVE mg/dL   Protein, ur NEGATIVE  NEGATIVE mg/dL   Urobilinogen, UA 0.2  0.0 - 1.0 mg/dL   Nitrite POSITIVE (*) NEGATIVE   Leukocytes, UA TRACE (*) NEGATIVE  URINE MICROSCOPIC-ADD ON     Status: Abnormal   Collection Time    07/29/12  2:48 PM      Result Value Range   Squamous Epithelial / LPF FEW (*) RARE   WBC, UA 3-6  <3 WBC/hpf   Bacteria, UA MANY (*) RARE    Radiology Reports: Dg Chest 2 View  07/29/2012  *RADIOLOGY REPORT*  Clinical Data: Productive cough, diabetic  CHEST - 2 VIEW  Comparison: Prior chest x-ray 07/27/2012  Findings: Stable appearance of patchy opacities and varicose bronchiectasis in the lingula, and dependent upper and lower lobes bilaterally.  No significant interval change compared to the recent prior chest x-ray.  Stable blunting of the left costophrenic angle. Cardiac and mediastinal contours remain within normal limits. There is atherosclerotic calcification of the transverse aorta.  No new large pleural effusion, pneumothorax or definite airspace consolidation.  No acute osseous abnormality.  IMPRESSION:  1.  No acute cardiopulmonary process 2. No significant interval change in the appearance of known varicose bronchiectasis  and scarring within the lingula and the dependent aspects of the upper and lower lobes bilaterally.   Original Report Authenticated By: Malachy Moan, M.D.     Electrocardiogram: EKG shows sinus rhythm at 74 beats per minute. Normal axis. Intervals are normal. No concerning ST or T-wave changes are noted.  Problem List  Principal Problem:   Generalized weakness Active Problems:   BRONCHIECTASIS, + MAIC   Hyponatremia   UTI (lower urinary tract infection)   DM type 2 (diabetes mellitus, type 2)   Assessment: This is 78 year old, Caucasian female, presents with profound weakness. There are couple of reasons why she could be week. She was started on 3 different antibiotics just 2 weeks ago for [facility] infection.  She also appears to have an abnormal, UA suggestive of a urinary tract infection. She's also mildly hyponatremic.  Plan: #1 generalized weakness: Most likely secondary to UTI. She'll be treated with antibiotics. She'll be given IV fluids. She'll be seen by physical and occupational therapy. TSH will be checked.  #2 urinary tract infection: Urine cultures will be followed up on. Ceftriaxone will be continued. She has history of recurrent UTIs and has seen urology in the past.  #3 bronchiectasis with recent MAIC diagnosis: Possible that one or all 3 of the antibiotics could be causing weakness and malaise. However, since UTI is a plausible reason we will continue these antibiotics for now. If she does not show any improvement in the weakness despite being treated for the UTI we may have to hold these antibiotics. We will notify Dr. Maple Hudson of this hospitalization. LFTs are normal.   #4 diabetes, type II: Sliding scale insulin will be provided. HbA1c will be checked.  #5 hyponatremia: Should be corrected with IV fluids.  #6 history of hypertension: Monitor blood pressure closely. Continue with Losartan but hold the diuretic  DVT Prophylaxis: Enoxaparin Code Status: Full code Family  Communication: Discussed with the patient in detail  Disposition Plan: Depends on physical and occupational therapy evaluation   Further management decisions will depend on results of further testing and patient's response to treatment.  [facility]  [facility] Pager [phone]  If 7PM-7AM, please contact night-coverage [email or web address] Password [facility]  07/29/2012, 5:47 PM

## 2012-07-29 NOTE — ED Provider Notes (Signed)
History     CSN: [id number]  Arrival date & time 07/29/12  1244   First MD Initiated Contact with Patient 07/29/12 1340      Chief Complaint  Patient presents with  . Weakness    (Consider location/radiation/quality/duration/timing/severity/associated sxs/prior treatment) HPI SHALLYN Alvarado is a 79 y.o. female who presents to ED with complaint of weakness. States recently diagnosed with mycobacterium avium by her pulmonologist, and started on 3 antibiotics. States since then increased weakness. No pain. No fever. No increased in cough or shortness of breath. States weakness when standing up and walking. States today unable to get to the kitchen to make food. Denies focal neurological problem. States lives alone.    Past Medical History  Diagnosis Date  . Diabetes mellitus   . Other symptoms involving nervous and musculoskeletal systems   . Insomnia, unspecified   . Cough   . Bronchiectasis with acute exacerbation   . Acute nasopharyngitis (common cold)   . Esophageal reflux     Past Surgical History  Procedure Laterality Date  . Foot    . Abdominal hysterectomy    . Colon surgery      Family History  Problem Relation Age of Onset  . Chronic bronchitis Father   . Other Sister     heart trouble  . Other Brother     heart trouble    History  Substance Use Topics  . Smoking status: Never Smoker   . Smokeless tobacco: Not on file  . Alcohol Use: No    OB History   Grav Para Term Preterm Abortions TAB SAB Ect Mult Living                  Review of Systems  Constitutional: Positive for activity change, appetite change and fatigue. Negative for fever and chills.  HENT: Negative for neck pain and neck stiffness.   Eyes: Negative for visual disturbance.  Respiratory: Positive for cough. Negative for chest tightness, shortness of breath and wheezing.   Cardiovascular: Negative.   Gastrointestinal: Negative.   Neurological: Positive for dizziness, weakness and  light-headedness. Negative for headaches.  All other systems reviewed and are negative.    Allergies  Welchol; Glucosamine; and Celecoxib  Home Medications   Current Outpatient Rx  Name  Route  Sig  Dispense  Refill  . albuterol (PROAIR HFA) 108 (90 BASE) MCG/ACT inhaler      INHALE 2 PUFFS BY MOUTH EVERY 4 HOURS AS NEEDED FOR SHORTNESS OF BREATH   1 Inhaler   3     THANK YOU   . allopurinol (ZYLOPRIM) 100 MG tablet   Oral   Take 100 mg by mouth daily.           Marland Kitchen ALPRAZolam (XANAX) 0.25 MG tablet   Oral   Take 0.25 mg by mouth 3 (three) times daily as needed. For anxiety.         Marland Kitchen aspirin 81 MG tablet   Oral   Take 81 mg by mouth daily.           . budesonide (PULMICORT) 0.25 MG/2ML nebulizer solution   Nebulization   Take 2 mLs (0.25 mg total) by nebulization daily.   60 mL   12   . cholecalciferol (VITAMIN D) 1000 UNITS tablet   Oral   Take 2,000 Units by mouth daily.           . Cinnamon 500 MG capsule   Oral   Take 500 mg  by mouth daily.         . clarithromycin (BIAXIN) 500 MG tablet   Oral   Take 500 mg by mouth 3 (three) times a week. Monday, Wednesday, and Friday.         . esomeprazole (NEXIUM) 40 MG capsule   Oral   Take 40 mg by mouth daily.           Marland Kitchen ethambutol (MYAMBUTOL) 400 MG tablet   Oral   Take 1,200 mg by mouth 3 (three) times a week. Monday, Weds, Friday         . ipratropium (ATROVENT) 0.02 % nebulizer solution   Nebulization   Take 500 mcg by nebulization 4 (four) times daily as needed.         Marland Kitchen losartan (COZAAR) 25 MG tablet   Oral   Take 12.5 mg by mouth daily.          . meclizine (ANTIVERT) 25 MG tablet   Oral   Take 25 mg by mouth 3 (three) times daily as needed. For dizziness.         . metFORMIN (GLUCOPHAGE) 500 MG tablet   Oral   Take 750 mg by mouth 2 (two) times daily.          . Omega-3 Fatty Acids (FISH OIL) 1000 MG CAPS   Oral   Take 1 capsule by mouth daily.         .  ondansetron (ZOFRAN ODT) 8 MG disintegrating tablet   Oral   Take 1 tablet (8 mg total) by mouth every 8 (eight) hours as needed for nausea.   10 tablet   0   . ONE TOUCH ULTRA TEST test strip      Use as directed to check blood glucose         . rifabutin (MYCOBUTIN) 150 MG capsule   Oral   Take 300 mg by mouth 3 (three) times a week. 2 capsules ( 300 mg total) every Monday, Weds, Friday         . traMADol (ULTRAM) 50 MG tablet   Oral   Take 50-100 mg by mouth every 6 (six) hours as needed (for cough).         . triamterene-hydrochlorothiazide (MAXZIDE) 75-50 MG per tablet   Oral   Take 0.5 tablets by mouth daily.          . vitamin E 400 UNIT capsule   Oral   Take 400 Units by mouth daily.         Marland Kitchen HYDROcodone-homatropine (HYCODAN) 5-1.5 MG/5ML syrup   Oral   Take 5 mLs by mouth every 6 (six) hours as needed for cough.   180 mL   0     BP 130/71  Pulse 80  Temp(Src) 98.2 F (36.8 C) (Oral)  Resp 20  SpO2 99%  Physical Exam  Nursing note and vitals reviewed. Constitutional: She is oriented to person, place, and time. She appears well-developed and well-nourished. No distress.  HENT:  Head: Normocephalic.  Eyes: Conjunctivae are normal.  Neck: Neck supple.  Cardiovascular: Normal rate, regular rhythm and normal heart sounds.   Pulmonary/Chest: Effort normal and breath sounds normal. No respiratory distress. She has no wheezes. She has no rales.  Abdominal: Soft. Bowel sounds are normal. She exhibits no distension. There is no tenderness. There is no rebound.  Musculoskeletal: She exhibits no edema.  Neurological: She is alert and oriented to person, place, and time. No cranial nerve  deficit. Coordination normal.  Skin: Skin is warm and dry.  Psychiatric: She has a normal mood and affect.    ED Course  Procedures (including critical care time)  Results for orders placed during the hospital encounter of 07/29/12  CBC WITH DIFFERENTIAL      Result  Value Range   WBC 4.0  4.0 - 10.5 K/uL   RBC 4.37  3.87 - 5.11 MIL/uL   Hemoglobin 12.6  12.0 - 15.0 g/dL   HCT 95.2  84.1 - 32.4 %   MCV 85.4  78.0 - 100.0 fL   MCH 28.8  26.0 - 34.0 pg   MCHC 33.8  30.0 - 36.0 g/dL   RDW 40.1  02.7 - 25.3 %   Platelets 231  150 - 400 K/uL   Neutrophils Relative 51  43 - 77 %   Neutro Abs 2.0  1.7 - 7.7 K/uL   Lymphocytes Relative 31  12 - 46 %   Lymphs Abs 1.2  0.7 - 4.0 K/uL   Monocytes Relative 15 (*) 3 - 12 %   Monocytes Absolute 0.6  0.1 - 1.0 K/uL   Eosinophils Relative 3  0 - 5 %   Eosinophils Absolute 0.1  0.0 - 0.7 K/uL   Basophils Relative 1  0 - 1 %   Basophils Absolute 0.0  0.0 - 0.1 K/uL  COMPREHENSIVE METABOLIC PANEL      Result Value Range   Sodium 132 (*) 135 - 145 mEq/L   Potassium 4.1  3.5 - 5.1 mEq/L   Chloride 92 (*) 96 - 112 mEq/L   CO2 30  19 - 32 mEq/L   Glucose, Bld 116 (*) 70 - 99 mg/dL   BUN 12  6 - 23 mg/dL   Creatinine, Ser 6.64  0.50 - 1.10 mg/dL   Calcium 9.7  8.4 - 40.3 mg/dL   Total Protein 7.0  6.0 - 8.3 g/dL   Albumin 3.7  3.5 - 5.2 g/dL   AST 22  0 - 37 U/L   ALT 12  0 - 35 U/L   Alkaline Phosphatase 64  39 - 117 U/L   Total Bilirubin 0.2 (*) 0.3 - 1.2 mg/dL   GFR calc non Af Amer 79 (*) >90 mL/min   GFR calc Af Amer >90  >90 mL/min  URINALYSIS, ROUTINE W REFLEX MICROSCOPIC      Result Value Range   Color, Urine YELLOW  YELLOW   APPearance CLEAR  CLEAR   Specific Gravity, Urine 1.014  1.005 - 1.030   pH 7.5  5.0 - 8.0   Glucose, UA NEGATIVE  NEGATIVE mg/dL   Hgb urine dipstick NEGATIVE  NEGATIVE   Bilirubin Urine NEGATIVE  NEGATIVE   Ketones, ur NEGATIVE  NEGATIVE mg/dL   Protein, ur NEGATIVE  NEGATIVE mg/dL   Urobilinogen, UA 0.2  0.0 - 1.0 mg/dL   Nitrite POSITIVE (*) NEGATIVE   Leukocytes, UA TRACE (*) NEGATIVE  URINE MICROSCOPIC-ADD ON      Result Value Range   Squamous Epithelial / LPF FEW (*) RARE   WBC, UA 3-6  <3 WBC/hpf   Bacteria, UA MANY (*) RARE  POCT I-STAT TROPONIN I       Result Value Range   Troponin i, poc 0.00  0.00 - 0.08 ng/mL   Comment 3            Dg Chest 2 View  07/29/2012  *RADIOLOGY REPORT*  Clinical Data: Productive cough, diabetic  CHEST -  2 VIEW  Comparison: Prior chest x-ray 07/27/2012  Findings: Stable appearance of patchy opacities and varicose bronchiectasis in the lingula, and dependent upper and lower lobes bilaterally.  No significant interval change compared to the recent prior chest x-ray.  Stable blunting of the left costophrenic angle. Cardiac and mediastinal contours remain within normal limits. There is atherosclerotic calcification of the transverse aorta.  No new large pleural effusion, pneumothorax or definite airspace consolidation.  No acute osseous abnormality.  IMPRESSION:  1.  No acute cardiopulmonary process 2. No significant interval change in the appearance of known varicose bronchiectasis and scarring within the lingula and the dependent aspects of the upper and lower lobes bilaterally.   Original Report Authenticated By: Malachy Moan, M.D.      Date: 07/29/2012  Rate: 75  Rhythm: normal sinus rhythm  QRS Axis: normal  Intervals: normal  ST/T Wave abnormalities: normal  Conduction Disutrbances:none  Narrative Interpretation:   Old EKG Reviewed: none available      1. Weakness   2. UTI (lower urinary tract infection)   3. Mycobacterium avium infection   4. DM type 2 (diabetes mellitus, type 2)   5. Generalized weakness       MDM  PT with generalized weakness, dizziness, onset about a week ago. Recently started treatment for mycobacterium avium in lungs. Today labs unremarkable. UA infected, rocephin started. Suspect generalized weakness due to several ongoing infections. Pt lives alone. This is her 2nd visit for the same in 2 days. Will admit to [facility]. Pt agrees.   Filed Vitals:   07/29/12 1526 07/29/12 1800 07/29/12 1943 07/29/12 2023  BP: 159/88 161/67  161/71  Pulse: 82 71  87  Temp:  98.1 F (36.7 C)   97.3 F (36.3 C)  TempSrc:  Oral  Oral  Resp:  18  16  Height:  5\' 8"  (1.727 m)    Weight:  152 lb 3.2 oz (69.037 kg)    SpO2:  100% 100% 95%           Lottie Mussel, PA-C 07/29/12 2053  Lottie Mussel, PA-C 07/29/12 2054

## 2012-07-29 NOTE — ED Notes (Signed)
Pt was seen on mon for weakness and not feeling well. Denies any pain. States that she has been drinking and eating some just not getting any better. Alert x4.

## 2012-07-29 NOTE — ED Notes (Signed)
Report given-transfer to 3rd floor 

## 2012-07-30 ENCOUNTER — Telehealth: Payer: Self-pay | Admitting: Internal Medicine

## 2012-07-30 LAB — COMPREHENSIVE METABOLIC PANEL
AST: 18 U/L (ref 0–37)
Albumin: 3.1 g/dL — ABNORMAL LOW (ref 3.5–5.2)
Alkaline Phosphatase: 48 U/L (ref 39–117)
Chloride: 96 mEq/L (ref 96–112)
Potassium: 3.5 mEq/L (ref 3.5–5.1)
Sodium: 133 mEq/L — ABNORMAL LOW (ref 135–145)
Total Bilirubin: 0.2 mg/dL — ABNORMAL LOW (ref 0.3–1.2)
Total Protein: 5.9 g/dL — ABNORMAL LOW (ref 6.0–8.3)

## 2012-07-30 LAB — GLUCOSE, CAPILLARY
Glucose-Capillary: 141 mg/dL — ABNORMAL HIGH (ref 70–99)
Glucose-Capillary: 134 mg/dL — ABNORMAL HIGH (ref 70–99)

## 2012-07-30 LAB — CBC
HCT: 34.5 % — ABNORMAL LOW (ref 36.0–46.0)
MCH: 28.8 pg (ref 26.0–34.0)
MCV: 85.6 fL (ref 78.0–100.0)
Platelets: 186 10*3/uL (ref 150–400)
RDW: 13 % (ref 11.5–15.5)

## 2012-07-30 LAB — HEMOGLOBIN A1C
Hgb A1c MFr Bld: 6.6 % — ABNORMAL HIGH (ref <5.7)
Mean Plasma Glucose: 143 mg/dL — ABNORMAL HIGH (ref <117)

## 2012-07-30 MED ORDER — TRAZODONE 25 MG HALF TABLET
25.0000 mg | ORAL_TABLET | Freq: Every evening | ORAL | Status: DC | PRN
  Administered 2012-07-30: 25 mg via ORAL
  Filled 2012-07-30: qty 1

## 2012-07-30 MED ORDER — ENSURE COMPLETE PO LIQD
237.0000 mL | Freq: Two times a day (BID) | ORAL | Status: DC
  Administered 2012-07-31 (×2): 237 mL via ORAL

## 2012-07-30 MED ORDER — OXYCODONE HCL 5 MG PO TABS
5.0000 mg | ORAL_TABLET | Freq: Four times a day (QID) | ORAL | Status: DC | PRN
  Administered 2012-07-30 (×2): 5 mg via ORAL
  Filled 2012-07-30 (×2): qty 1

## 2012-07-30 NOTE — Telephone Encounter (Signed)
Katie Alvarado is in [place]. Family will be coming. I reviewed long term bronchiectasis, recent initiation of Rx for [facility], and recurrent hyponatremia. Speculate about depression, roe of chronic back pain, and whether antibiotics affect appetite to aggravate electrolyte imbalances. Katie Alvarado is in hospital now at [facility] and [facility] has spoken with nurse there. I suggested they try to contact clinical social worker about discharge plans.

## 2012-07-30 NOTE — Care Management (Signed)
Cm spoke with patient concerning discharge planning. CM noted PT eval for no further PT follow up. Pt states lives alone. Pt states close friend assist with transportation to MD visits, grocery shopping, and with home care. Pt states would like HHRN upon discharge for Dx management. PCP: Mickie Hillier, MD. Will continue to follow for discharge needs.    Roxy Manns Jaquilla Woodroof,RN,BSN [phone]

## 2012-07-30 NOTE — ED Provider Notes (Signed)
  Medical screening examination/treatment/procedure(s) were performed by non-physician practitioner and as supervising physician I was immediately available for consultation/collaboration.  On my exam the patient was in no distress.  She has very unusual pathology, and today her labs are largely unremarkable aside from evidence of a urinary tract infection  I saw the ECG (if appropriate), relevant labs and studies - I agree with the interpretation.    Gerhard Munch, MD 07/30/12 [phone]

## 2012-07-30 NOTE — Evaluation (Signed)
Occupational Therapy Evaluation Patient Details Name: Katie Alvarado MRN: [id number] DOB: 18-Feb-1926 Today's Date: 07/30/2012 Time: 9562-1308 OT Time Calculation (min): 28 min  OT Assessment / Plan / Recommendation Clinical Impression  This 79 year old female was admitted for generalized weakness.  She has a h/o DM and HTN and was recently diagnosed with lung infection.  At baseline, pt is mod I to independent with all ADLs/home IADLs.  She will benefit from continued OT in acute setting to increase activity tolerance to reach mod I goals.      OT Assessment  Patient needs continued OT Services    Follow Up Recommendations  Supervision/Assistance - 24 hour;No OT follow up    Barriers to Discharge      Equipment Recommendations  None recommended by OT    Recommendations for Other Services    Frequency  Min 2X/week    Precautions / Restrictions Precautions Precautions: Fall Restrictions Weight Bearing Restrictions: No   Pertinent Vitals/Pain Pain in low back--worse with coughing.  Sats 95% on RA.  Notified nursing about pain--tylenol brought and she will check with MD for stronger meds    ADL  Grooming: Wash/dry hands;Supervision/safety Where Assessed - Grooming: Supported standing Upper Body Bathing: Set up Where Assessed - Upper Body Bathing: Unsupported sitting Lower Body Bathing: Supervision/safety Where Assessed - Lower Body Bathing: Supported sit to stand Upper Body Dressing: Minimal assistance (iv) Where Assessed - Upper Body Dressing: Unsupported sitting Lower Body Dressing: Supervision/safety Where Assessed - Lower Body Dressing: Supported sit to stand Toilet Transfer: Hydrographic surveyor Method: Sit to Barista: Comfort height toilet Toileting - Architect and Hygiene: Supervision/safety Where Assessed - Engineer, mining and Hygiene: Sit to stand from 3-in-1 or toilet Equipment Used: Rolling  walker Transfers/Ambulation Related to ADLs: ambulated to bathroom using RW--needs hand held assist without this.  She has one at home.   ADL Comments: Pt states she can have 24/7 at home.  Does not feel good today:  back hurts from coughing.  Did limited adls.  Removed all bedding from bed due to powder used.  Sign posted to avoid this as it exacerbates cough.  Pt aware of taking rest breaks and activity tolerance    OT Diagnosis: Generalized weakness  OT Problem List: Decreased strength;Decreased activity tolerance;Impaired balance (sitting and/or standing);Pain OT Treatment Interventions: Self-care/ADL training;Patient/family education;Balance training   OT Goals Acute Rehab OT Goals OT Goal Formulation: With patient Time For Goal Achievement: 08/13/12 Potential to Achieve Goals: Good ADL Goals Pt Will Transfer to Toilet: with modified independence;Comfort height toilet ADL Goal: Toilet Transfer - Progress: Goal set today Pt Will Perform Tub/Shower Transfer: Shower transfer;with modified independence;Shower seat with back ADL Goal: Web designer - Progress: Goal set today Miscellaneous OT Goals Miscellaneous OT Goal #1: Pt will gather clothes and complete adl at mod I level, sit to stand OT Goal: Miscellaneous Goal #1 - Progress: Goal set today  Visit Information  Last OT Received On: 07/30/12 Assistance Needed: +1 PT/OT Co-Evaluation/Treatment: Yes    Subjective Data  Subjective: I have someone get my groceries for me.  I can have someone stay with me all the time Patient Stated Goal: get rid of pain and get back to baseline   Prior Functioning     Home Living Lives With: Alone Type of Home: House Home Access: Ramped entrance Bathroom Shower/Tub: Walk-in shower Bathroom Toilet: Handicapped height Home Adaptive Equipment: Bedside commode/3-in-1;Straight cane;Shower chair with back;Walker - rolling;Walker - four  wheeled Additional Comments: friend gets  groceries Prior Function Level of Independence: Independent;Independent with assistive device(s) Comments: cane outside Communication Communication: No difficulties         Vision/Perception     Cognition  Cognition Overall Cognitive Status: Appears within functional limits for tasks assessed/performed Arousal/Alertness: Awake/alert Orientation Level: Oriented X4 / Intact Behavior During Session: [facility] for tasks performed    Extremity/Trunk Assessment Right Upper Extremity Assessment RUE ROM/Strength/Tone: Within functional levels Left Upper Extremity Assessment LUE ROM/Strength/Tone: Within functional levels     Mobility Bed Mobility Bed Mobility: Supine to Sit Supine to Sit: 5: Supervision;HOB flat Sitting - Scoot to Edge of Bed: 4: Min assist Transfers Transfers: Sit to Stand Sit to Stand: 4: Min assist;With armrests;From bed;From chair/3-in-1 (min from bed; min guard from 3:1)     Exercise     Balance     End of Session OT - End of Session Activity Tolerance: Patient tolerated treatment well Patient left: with call bell/phone within reach;in bed (initially sat in chair but decided back to bed after few min) Nurse Communication: Patient requests pain meds  GO     Oluwatobi Visser 07/30/2012, 12:31 PM Marica Otter, OTR/L [phone] 07/30/2012

## 2012-07-30 NOTE — Evaluation (Signed)
Physical Therapy Evaluation Patient Details Name: Katie Alvarado MRN: [id number] DOB: Jun 19, 1925 Today's Date: 07/30/2012 Time: 0865-7846 PT Time Calculation (min): 28 min  PT Assessment / Plan / Recommendation Clinical Impression  Pt. is 79 yo female admitted with weakness, cough, recent dx. of lung infection. Pt/ was mod. independent PTA. Pt. was mobile in the room, coughing requently(pt reports may be due to powders ssprinkled--linenchanged). Pt. ambulated in hallway with min assist. Pt. reports desire to return home with 24 hr. caregivers.    PT Assessment  Patient needs continued PT services    Follow Up Recommendations  No PT follow up (pt. states she does not need it.)    Does the patient have the potential to tolerate intense rehabilitation      Barriers to Discharge        Equipment Recommendations  None recommended by PT    Recommendations for Other Services     Frequency Min 3X/week    Precautions / Restrictions Precautions Precautions: Fall Restrictions Weight Bearing Restrictions: No   Pertinent Vitals/Pain       Mobility  Bed Mobility Bed Mobility: Supine to Sit Supine to Sit: 5: Supervision;HOB flat Sitting - Scoot to Edge of Bed: 4: Min assist Transfers Sit to Stand: 4: Min assist;With armrests;From bed;From chair/3-in-1 (min from bed; min guard from 3:1) Ambulation/Gait Ambulation/Gait Assistance: 4: Min assist;4: Min guard Ambulation Distance (Feet): 150 Feet Assistive device: Rolling walker Ambulation/Gait Assistance Details: if no RW , needs HHA/min  A, with RW min guard. Gait Pattern: Step-through pattern Gait velocity: wfl    Exercises     PT Diagnosis: Difficulty walking;Acute pain;Generalized weakness  PT Problem List: Decreased strength;Decreased activity tolerance;Decreased mobility;Pain PT Treatment Interventions: DME instruction;Gait training;Functional mobility training;Therapeutic activities;Therapeutic exercise;Patient/family  education   PT Goals Acute Rehab PT Goals PT Goal Formulation: With patient Time For Goal Achievement: 08/13/12 Potential to Achieve Goals: Good Pt will go Sit to Stand: with modified independence PT Goal: Sit to Stand - Progress: Goal set today Pt will go Stand to Sit: with modified independence PT Goal: Stand to Sit - Progress: Goal set today Pt will Ambulate: >150 feet;with modified independence PT Goal: Ambulate - Progress: Goal set today  Visit Information  Last PT Received On: 07/30/12 Assistance Needed: +1 PT/OT Co-Evaluation/Treatment: Yes    Subjective Data  Subjective: I hurt in my side. I have someone who can stay with me. Patient Stated Goal: to go home.   Prior Functioning  Home Living Lives With: Alone Available Help at Discharge: Family;Available 24 hours/day Type of Home: House Home Access: Ramped entrance Bathroom Shower/Tub: Walk-in shower Bathroom Toilet: Handicapped height Home Adaptive Equipment: Bedside commode/3-in-1;Straight cane;Shower chair with back;Walker - rolling;Walker - four wheeled Additional Comments: friend gets groceries Prior Function Level of Independence: Independent;Independent with assistive device(s) Comments: cane outside Communication Communication: No difficulties    Cognition  Cognition Overall Cognitive Status: Appears within functional limits for tasks assessed/performed Arousal/Alertness: Awake/alert Orientation Level: Oriented X4 / Intact Behavior During Session: [facility] for tasks performed    Extremity/Trunk Assessment Right Upper Extremity Assessment RUE ROM/Strength/Tone: Within functional levels Left Upper Extremity Assessment LUE ROM/Strength/Tone: Within functional levels Right Lower Extremity Assessment RLE ROM/Strength/Tone: WFL for tasks assessed Left Lower Extremity Assessment LLE ROM/Strength/Tone: WFL for tasks assessed Trunk Assessment Trunk Assessment: Normal   Balance Balance Balance Assessed:  Yes Static Standing Balance Static Standing - Balance Support: No upper extremity supported Static Standing - Level of Assistance: 5: Stand by assistance  End of  Session PT - End of Session Activity Tolerance: Patient limited by pain;Patient limited by fatigue Patient left: in bed;with call bell/phone within reach Nurse Communication: Mobility status  GP     Rada Hay 07/30/2012, 12:53 PM

## 2012-07-30 NOTE — Progress Notes (Signed)
[facility] PROGRESS NOTE  Katie Alvarado OZH:[id number] DOB: [birth year] DOA: 07/29/2012  PCP: Mickie Hillier, MD  Brief HPI: Katie Alvarado is a 79 y.o. 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 This was based on a CT scan that was done a few weeks ago. She was started on triple antibiotic treatment by Dr. Maple Hudson on March 26. Patient stated that since Friday she has had profound weakness. She has found it very difficult to walk and stand. Denied any focal weakness. Denied any falls or dizziness. She tried to stay hydrated by drinking Gatorade. She's had hot and cold sensations. Had some nausea, but no vomiting. Denied any diarrhea or abdominal pain. Denied chest pain. The shortness of breath and cough is chronic. Denied any blood in the sputum. She has a clear expectoration. She actually presented to the emergency department on Monday. She was hydrated with IV fluids, and was discharged home. However, her symptoms did not improve, and, so, she decided to come back. She also admitted that whenever she urinates she has pain and heaviness in the lower abdomen. However, denied any pain with urination, per se. Denied any fever. She had some headache last night without any vision disturbances. She mentioned that she was admitted for similar reasons in 2012. And review of the records show that she was thought to have UTI at that time. She was also on prednisone at that time the dose of which was increased. She is no longer on steroids.  Past medical history:  Past Medical History  Diagnosis Date  . Diabetes mellitus   . Other symptoms involving nervous and musculoskeletal systems   . Insomnia, unspecified   . Cough   . Bronchiectasis with acute exacerbation   . Acute nasopharyngitis (common cold)   . Esophageal reflux     Consultants: None  Procedures:  None  Antibiotics: Iv Ceftriaxone 4/9--> Also on Ethambutol, Rifabutin and Biaxin 3x/week for [facility]  Subjective: Patient has a cough which is chronic for her. Feels stronger. No pain with urination now. Requesting medication for insomnia.   Objective: Vital Signs  Filed Vitals:   07/29/12 1800 07/29/12 1943 07/29/12 2023 07/30/12 0412  BP: 161/67  161/71 144/68  Pulse: 71  87 74  Temp: 98.1 F (36.7 C)  97.3 F (36.3 C) 98.3 F (36.8 C)  TempSrc: Oral  Oral Oral  Resp: 18  16 18   Height: 5\' 8"  (1.727 m)     Weight: 69.037 kg (152 lb 3.2 oz)     SpO2: 100% 100% 95% 99%    Intake/Output Summary (Last 24 hours) at 07/30/12 0813 Last data filed at 07/30/12 6295  Gross per 24 hour  Intake    220 ml  Output      0 ml  Net    220 ml   Filed Weights   07/29/12 1800  Weight: 69.037 kg (152 lb 3.2 oz)    General appearance: alert, cooperative, appears stated age and no distress Resp: Few crackles at bases. Coarse breath sounds.  Cardio: regular rate and rhythm, S1, S2 normal, no murmur, click, rub or gallop GI: soft, non-tender; bowel sounds normal; no masses,  no organomegaly Extremities: extremities normal, atraumatic, no cyanosis or edema Neurologic: Alert and oriented x 3. No change from yesterday  Lab Results:  Basic Metabolic Panel:  Recent Labs Lab 07/27/12 1424 07/29/12 1420 07/30/12 0401  NA 126* 132*  133*  K 4.3 4.1 3.5  CL 86* 92* 96  CO2 29 30 28   GLUCOSE 108* 116* 90  BUN 12 12 11   CREATININE 0.64 0.66 0.75  CALCIUM 9.0 9.7 8.8   Liver Function Tests:  Recent Labs Lab 07/27/12 1424 07/29/12 1420 07/30/12 0401  AST 24 22 18   ALT 13 12 11   ALKPHOS 60 64 48  BILITOT 0.2* 0.2* 0.2*  PROT 7.1 7.0 5.9*  ALBUMIN 3.6 3.7 3.1*   CBC:  Recent Labs Lab 07/27/12 1424 07/29/12 1420 07/30/12 0401  WBC 4.3 4.0 3.4*  NEUTROABS 2.1 2.0  --   HGB 12.9 12.6 11.6*  HCT 37.9 37.3 34.5*  MCV 84.6 85.4 85.6  PLT 242 231 186   CBG:  Recent  Labs Lab 07/30/12 0733  GLUCAP 104*     Studies/Results: Dg Chest 2 View  07/29/2012  *RADIOLOGY REPORT*  Clinical Data: Productive cough, diabetic  CHEST - 2 VIEW  Comparison: Prior chest x-ray 07/27/2012  Findings: Stable appearance of patchy opacities and varicose bronchiectasis in the lingula, and dependent upper and lower lobes bilaterally.  No significant interval change compared to the recent prior chest x-ray.  Stable blunting of the left costophrenic angle. Cardiac and mediastinal contours remain within normal limits. There is atherosclerotic calcification of the transverse aorta.  No new large pleural effusion, pneumothorax or definite airspace consolidation.  No acute osseous abnormality.  IMPRESSION:  1.  No acute cardiopulmonary process 2. No significant interval change in the appearance of known varicose bronchiectasis and scarring within the lingula and the dependent aspects of the upper and lower lobes bilaterally.   Original Report Authenticated By: Malachy Moan, M.D.     Medications:  Scheduled: . allopurinol  100 mg Oral Daily  . antiseptic oral rinse  15 mL Mouth Rinse q12n4p  . aspirin  81 mg Oral Daily  . budesonide (PULMICORT) nebulizer solution  0.25 mg Nebulization BID  . cefTRIAXone (ROCEPHIN)  IV  1 g Intravenous Q24H  . chlorhexidine  15 mL Mouth Rinse BID  . clarithromycin  500 mg Oral 3 times weekly  . enoxaparin (LOVENOX) injection  40 mg Subcutaneous QHS  . ethambutol  1,200 mg Oral 3 times weekly  . insulin aspart  0-15 Units Subcutaneous TID WC  . losartan  12.5 mg Oral Daily  . pantoprazole  40 mg Oral Daily  . rifabutin  300 mg Oral 3 times weekly   Continuous:  WUJ:[id number], acetaminophen, albuterol, ALPRAZolam, HYDROcodone-homatropine, ondansetron (ZOFRAN) IV, ondansetron, traZODone  Assessment/Plan:  Principal Problem:   Generalized weakness Active Problems:   BRONCHIECTASIS, + MAIC   Hyponatremia   UTI (lower urinary tract  infection)   DM type 2 (diabetes mellitus, type 2)    Generalized weakness Most likely secondary to UTI. She is feeling stronger. Continue antibiotics. Seems adequately hydrated. Saline lock IV. PT/OT evaluation. TSH was normal.  Urinary tract infection Urine cultures will be followed up on. Ceftriaxone will be continued. She has history of recurrent UTIs and has seen urology in the past. She may benefit from re-evaluation as outpatient.  Bronchiectasis with recent MAIC diagnosis Possible that one or all 3 of the antibiotics she is taking for [facility] could be causing weakness and malaise. However, since UTI is a plausible reason we will continue these antibiotics for now. Since she is feeling better, we will continue current course. We will notify Dr. Maple Hudson of this hospitalization. LFTs are normal.   Diabetes, type II Sliding scale insulin will  be provided. HbA1c is 6.6.   Hyponatremia Improved. Continue to monitor.   History of hypertension BP slightly high. Continue to monitor. Continue with Losartan but holding the diuretic due to hyponatremia.  Insomnia Start Trazodone  DVT Prophylaxis: Enoxaparin  Code Status: Full code  Family Communication: Discussed with the patient in detail  Disposition Plan: Depends on physical and occupational therapy evaluation.      LOS: 1 day   [facility]  [facility] Pager [phone] 07/30/2012, 8:13 AM  If 8PM-8AM, please contact night-coverage at [email or web address], password [email or web address]

## 2012-07-30 NOTE — Telephone Encounter (Signed)
Left message for granddaughter that CY is seeing patients at this time but would send message to him to make him aware that she would like to discuss patients health with him.

## 2012-07-30 NOTE — Progress Notes (Signed)
INITIAL NUTRITION ASSESSMENT  DOCUMENTATION CODES Per approved criteria  -Not Applicable   INTERVENTION: - Ensure Complete BID - Encouraged increased PO intake - Will continue to monitor   NUTRITION DIAGNOSIS: Unintended weight loss related to poor appetite as evidenced by pt statement, weight trend.   Goal: Pt to consume >90% of meals/supplements  Monitor:  Weights, labs, intake  Reason for Assessment: Nutrition risk   79 y.o. 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   Met with pt who reports poor appetite for the past 2 weeks with pt only consuming 50% of meals. Pt and family report 20 pound unintended weight loss in the past 2 months however weight trend shows only 4 pound unintended weight loss during this time frame. Pt reports she has been trying to drink 2 Ensure/day at home in addition to Gatorade. Pt reports eating 50% of lunch today.   Height: Ht Readings from Last 1 Encounters:  07/29/12 5\' 8"  (1.727 m)    Weight: Wt Readings from Last 1 Encounters:  07/29/12 152 lb 3.2 oz (69.037 kg)    Ideal Body Weight: 140 lb  % Ideal Body Weight: 108  Wt Readings from Last 10 Encounters:  07/29/12 152 lb 3.2 oz (69.037 kg)  07/14/12 158 lb 12.8 oz (72.031 kg)  06/09/12 156 lb 9.6 oz (71.033 kg)  05/28/12 158 lb 3.2 oz (71.759 kg)  01/16/12 157 lb 12.8 oz (71.578 kg)  11/14/11 158 lb 12.8 oz (72.031 kg)  09/30/11 163 lb 6.4 oz (74.118 kg)  04/11/11 167 lb 8 oz (75.978 kg)  04/09/11 163 lb 9.6 oz (74.208 kg)  05/15/10 160 lb 4 oz (72.689 kg)    Usual Body Weight: 172 lb 2 months ago per pt  % Usual Body Weight: 88  BMI:  Body mass index is 23.15 kg/(m^2).  Estimated Nutritional Needs: Kcal: 1725-2100 Protein: 70-85g Fluid: 1.7-2.1L/day  Skin:  Intact   Diet Order: Carb Control  EDUCATION NEEDS: -No education needs identified at this time   Intake/Output Summary (Last 24 hours) at 07/30/12 1643 Last data filed at 07/30/12 1321  Gross per 24 hour  Intake    460 ml  Output      0 ml  Net    460 ml    Last BM: 4/9  Labs:   Recent Labs Lab 07/27/12 1424 07/29/12 1420 07/30/12 0401  NA 126* 132* 133*  K 4.3 4.1 3.5  CL 86* 92* 96  CO2 29 30 28   BUN 12 12 11   CREATININE 0.64 0.66 0.75  CALCIUM 9.0 9.7 8.8  GLUCOSE 108* 116* 90    CBG (last 3)   Recent Labs  07/30/12 0733 07/30/12 1147 07/30/12 1623  GLUCAP 104* 141* 134*    Scheduled Meds: . allopurinol  100 mg Oral Daily  . antiseptic oral rinse  15 mL Mouth Rinse q12n4p  . aspirin  81 mg Oral Daily  . budesonide (PULMICORT) nebulizer solution  0.25 mg Nebulization BID  . cefTRIAXone (ROCEPHIN)  IV  1 g Intravenous Q24H  . chlorhexidine  15 mL Mouth Rinse BID  . clarithromycin  500 mg Oral 3 times weekly  . enoxaparin (LOVENOX) injection  40 mg Subcutaneous QHS  . ethambutol  1,200 mg Oral 3 times weekly  . insulin aspart  0-15 Units Subcutaneous TID WC  .  losartan  12.5 mg Oral Daily  . pantoprazole  40 mg Oral Daily  . rifabutin  300 mg Oral 3 times weekly    Continuous Infusions:   Past Medical History  Diagnosis Date  . Diabetes mellitus   . Other symptoms involving nervous and musculoskeletal systems   . Insomnia, unspecified   . Cough   . Bronchiectasis with acute exacerbation   . Acute nasopharyngitis (common cold)   . Esophageal reflux     Past Surgical History  Procedure Laterality Date  . Foot    . Abdominal hysterectomy    . Colon surgery    . Appendectomy       Levon Hedger MS, RD, LDN [phone] Pager [phone] After Hours Pager

## 2012-07-31 LAB — BASIC METABOLIC PANEL
Calcium: 8.5 mg/dL (ref 8.4–10.5)
GFR calc Af Amer: 90 mL/min — ABNORMAL LOW (ref >90)
GFR calc non Af Amer: 77 mL/min — ABNORMAL LOW (ref >90)
Glucose, Bld: 110 mg/dL — ABNORMAL HIGH (ref 70–99)
Potassium: 3.4 mEq/L — ABNORMAL LOW (ref 3.5–5.1)
Sodium: 133 mEq/L — ABNORMAL LOW (ref 135–145)

## 2012-07-31 LAB — GLUCOSE, CAPILLARY
Glucose-Capillary: 115 mg/dL — ABNORMAL HIGH (ref 70–99)
Glucose-Capillary: 110 mg/dL — ABNORMAL HIGH (ref 70–99)
Glucose-Capillary: 120 mg/dL — ABNORMAL HIGH (ref 70–99)
Glucose-Capillary: 107 mg/dL — ABNORMAL HIGH (ref 70–99)
Glucose-Capillary: 215 mg/dL — ABNORMAL HIGH (ref 70–99)

## 2012-07-31 LAB — URINE CULTURE: Colony Count: >=100000

## 2012-07-31 LAB — CBC
Hemoglobin: 11.8 g/dL — ABNORMAL LOW (ref 12.0–15.0)
MCH: 28.3 pg (ref 26.0–34.0)
MCHC: 33.2 g/dL (ref 30.0–36.0)
Platelets: 172 10*3/uL (ref 150–400)
RBC: 4.17 MIL/uL (ref 3.87–5.11)

## 2012-07-31 MED ORDER — POTASSIUM CHLORIDE CRYS ER 20 MEQ PO TBCR
40.0000 meq | EXTENDED_RELEASE_TABLET | Freq: Once | ORAL | Status: AC
  Administered 2012-07-31: 40 meq via ORAL
  Filled 2012-07-31: qty 2

## 2012-07-31 MED ORDER — BISACODYL 5 MG PO TBEC
5.0000 mg | DELAYED_RELEASE_TABLET | Freq: Every day | ORAL | Status: DC | PRN
  Administered 2012-07-31: 5 mg via ORAL
  Filled 2012-07-31: qty 1

## 2012-07-31 NOTE — Care Management (Signed)
CARE MANAGEMENT NOTE 07/31/2012  Patient:  SETAREH, ROM   Account Number:  [id number]  Date Initiated:  07/31/2012  Documentation initiated by:  Dorothymae Maciver  Subjective/Objective Assessment:   79 yo female admitted with UTI. PTA pt from home.     Action/Plan:   Home when stable   Anticipated DC Date:     Anticipated DC Plan:  HOME W HOME HEALTH SERVICES      DC Planning Services  CM consult      Choice offered to / List presented to:  C-1 Patient           Status of service:  In process, will continue to follow Medicare Important Message given?   (If response is "NO", the following Medicare IM given date fields will be blank) Date Medicare IM given:   Date Additional Medicare IM given:    Discharge Disposition:    Per UR Regulation:  Reviewed for med. necessity/level of care/duration of stay  If discussed at Long Length of Stay Meetings, dates discussed:    Comments:  07/31/12 1428 Debby Clyne,RN,BSN [phone] Cm spoke with patient concerning discharge planning. CM noted PT eval for no further PT follow up. Pt states lives alone. Pt states close friend assist with transportation to MD visits, grocery shopping, and with home care. Pt states would like HHRN upon discharge for Dx management. PCP: Mickie Hillier, MD. Will continue to follow for discharge needs.

## 2012-07-31 NOTE — Progress Notes (Signed)
Pt has ambulated hallway 2x this afternoon. She has experienced no SOB, dyspnea, or distress. Will continue to monitor.

## 2012-07-31 NOTE — Progress Notes (Signed)
Occupational Therapy Treatment Patient Details Name: Katie Alvarado MRN: [id number] DOB: 25-Sep-1925 Today's Date: 07/31/2012 Time: 1207-1222 OT Time Calculation (min): 15 min  OT Assessment / Plan / Recommendation Comments on Treatment Session      Follow Up Recommendations  Supervision/Assistance - 24 hour;No OT follow up    Barriers to Discharge       Equipment Recommendations  None recommended by OT    Recommendations for Other Services    Frequency Min 2X/week   Plan      Precautions / Restrictions Precautions Precautions: Fall Restrictions Weight Bearing Restrictions: No   Pertinent Vitals/Pain No pain    ADL  Toilet Transfer: Min Pension scheme manager Method: Sit to Barista: Raised toilet seat with arms (or 3-in-1 over toilet) Tub/Shower Transfer: Insurance risk surveyor Method: Science writer: Walk in shower;Grab bars Transfers/Ambulation Related to ADLs: retrieved items from closet and drawer wtih min guard.  Pt did not want to use walker.  No LOB but pt steadier with this.   ADL Comments: Reviewed energy conservation: pt already uses many:  lukewarm water, sitting when possible, breaks and prioritizing    OT Diagnosis:    OT Problem List:   OT Treatment Interventions:     OT Goals ADL Goals Pt Will Transfer to Toilet: with modified independence;Comfort height toilet ADL Goal: Toilet Transfer - Progress: Progressing toward goals Pt Will Perform Tub/Shower Transfer: Shower transfer;with modified independence;Shower seat with back ADL Goal: Web designer - Progress: Progressing toward goals Miscellaneous OT Goals Miscellaneous OT Goal #1: Pt will gather clothes and complete adl at mod I level, sit to stand OT Goal: Miscellaneous Goal #1 - Progress: Progressing toward goals  Visit Information  Last OT Received On: 07/31/12 Assistance Needed: +1    Subjective Data      Prior Functioning        Cognition  Cognition Overall Cognitive Status: Appears within functional limits for tasks assessed/performed Behavior During Session: [facility] [address] for tasks performed    Mobility  Bed Mobility Bed Mobility: Supine to Sit Supine to Sit: 7: Independent;HOB flat Transfers Transfers: Sit to Stand Sit to Stand: 4: Min assist;With armrests;From bed;From chair/3-in-1 Transfer via Lift Equipment:  (min guard)    Exercises      Balance     End of Session OT - End of Session Activity Tolerance: Patient tolerated treatment well Patient left: with call bell/phone within reach;in bed  GO     Chancy Smigiel 07/31/2012, 12:50 PM Marica Otter, OTR/L [phone] 07/31/2012

## 2012-07-31 NOTE — Progress Notes (Signed)
[facility] PROGRESS NOTE  Katie LUHMANN Alvarado:[id number] DOB: 11/06/1925 DOA: 07/29/2012  PCP: Mickie Hillier, MD  Brief HPI: Katie Alvarado is a 79 y.o. 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 This was based on a CT scan that was done a few weeks ago. She was started on triple antibiotic treatment by Dr. Maple Hudson on March 26. Patient stated that since Friday she has had profound weakness. She has found it very difficult to walk and stand. Denied any focal weakness. Denied any falls or dizziness. She tried to stay hydrated by drinking Gatorade. She's had hot and cold sensations. Had some nausea, but no vomiting. Denied any diarrhea or abdominal pain. Denied chest pain. The shortness of breath and cough is chronic. Denied any blood in the sputum. She has a clear expectoration. She actually presented to the emergency department on Monday. She was hydrated with IV fluids, and was discharged home. However, her symptoms did not improve, and, so, she decided to come back. She also admitted that whenever she urinates she has pain and heaviness in the lower abdomen. However, denied any pain with urination, per se. Denied any fever. She had some headache last night without any vision disturbances. She mentioned that she was admitted for similar reasons in 2012. And review of the records show that she was thought to have UTI at that time. She was also on prednisone at that time the dose of which was increased. She is no longer on steroids.  Past medical history:  Past Medical History  Diagnosis Date  . Diabetes mellitus   . Other symptoms involving nervous and musculoskeletal systems   . Insomnia, unspecified   . Cough   . Bronchiectasis with acute exacerbation   . Acute nasopharyngitis (common cold)   . Esophageal reflux     Consultants: None  Procedures:  None  Antibiotics: Iv Ceftriaxone 4/9--> Also on Ethambutol, Rifabutin and Biaxin 3x/week for [facility]  Subjective: Patient feels better. Feels stronger. No pain with urination now. No further back pain.  Objective: Vital Signs  Filed Vitals:   07/30/12 1321 07/30/12 1951 07/30/12 2141 07/31/12 0402  BP: 147/67  127/51 150/63  Pulse: 89  79 82  Temp: 98.6 F (37 C)  98.4 F (36.9 C) 98.8 F (37.1 C)  TempSrc: Oral  Oral Oral  Resp: 16  15 18   Height:      Weight:      SpO2: 95% 96% 94% 95%    Intake/Output Summary (Last 24 hours) at 07/31/12 0931 Last data filed at 07/31/12 0402  Gross per 24 hour  Intake    600 ml  Output      0 ml  Net    600 ml   Filed Weights   07/29/12 1800  Weight: 69.037 kg (152 lb 3.2 oz)    General appearance: alert, cooperative, appears stated age and no distress Resp: Few crackles at bases. Coarse breath sounds.  Cardio: regular rate and rhythm, S1, S2 normal, no murmur, click, rub or gallop GI: soft, non-tender; bowel sounds normal; no masses,  no organomegaly Extremities: extremities normal, atraumatic, no cyanosis or edema Neurologic: Alert and oriented x 3. No focal deficits.  Lab Results:  Basic Metabolic Panel:  Recent Labs Lab 07/27/12 1424 07/29/12 1420 07/30/12 0401 07/31/12 0346  NA 126* 132* 133* 133*  K 4.3 4.1 3.5 3.4*  CL 86* 92* 96 96  CO2 29 30 28 29   GLUCOSE 108* 116* 90 110*  BUN 12 12 11 10   CREATININE 0.64 0.66 0.75 0.70  CALCIUM 9.0 9.7 8.8 8.5   Liver Function Tests:  Recent Labs Lab 07/27/12 1424 07/29/12 1420 07/30/12 0401  AST 24 22 18   ALT 13 12 11   ALKPHOS 60 64 48  BILITOT 0.2* 0.2* 0.2*  PROT 7.1 7.0 5.9*  ALBUMIN 3.6 3.7 3.1*   CBC:  Recent Labs Lab 07/27/12 1424 07/29/12 1420 07/30/12 0401 07/31/12 0346  WBC 4.3 4.0 3.4* 3.3*  NEUTROABS 2.1 2.0  --   --   HGB 12.9 12.6 11.6* 11.8*  HCT 37.9 37.3 34.5* 35.5*  MCV 84.6 85.4 85.6 85.1  PLT 242 231 186 172   CBG:  Recent  Labs Lab 07/30/12 0733 07/30/12 1147 07/30/12 1623 07/30/12 2139 07/31/12 0735  GLUCAP 104* 141* 134* 115* 110*     Studies/Results: Dg Chest 2 View  07/29/2012  *RADIOLOGY REPORT*  Clinical Data: Productive cough, diabetic  CHEST - 2 VIEW  Comparison: Prior chest x-ray 07/27/2012  Findings: Stable appearance of patchy opacities and varicose bronchiectasis in the lingula, and dependent upper and lower lobes bilaterally.  No significant interval change compared to the recent prior chest x-ray.  Stable blunting of the left costophrenic angle. Cardiac and mediastinal contours remain within normal limits. There is atherosclerotic calcification of the transverse aorta.  No new large pleural effusion, pneumothorax or definite airspace consolidation.  No acute osseous abnormality.  IMPRESSION:  1.  No acute cardiopulmonary process 2. No significant interval change in the appearance of known varicose bronchiectasis and scarring within the lingula and the dependent aspects of the upper and lower lobes bilaterally.   Original Report Authenticated By: Malachy Moan, M.D.     Medications:  Scheduled: . allopurinol  100 mg Oral Daily  . antiseptic oral rinse  15 mL Mouth Rinse q12n4p  . aspirin  81 mg Oral Daily  . budesonide (PULMICORT) nebulizer solution  0.25 mg Nebulization BID  . cefTRIAXone (ROCEPHIN)  IV  1 g Intravenous Q24H  . chlorhexidine  15 mL Mouth Rinse BID  . clarithromycin  500 mg Oral 3 times weekly  . enoxaparin (LOVENOX) injection  40 mg Subcutaneous QHS  . ethambutol  1,200 mg Oral 3 times weekly  . feeding supplement  237 mL Oral BID BM  . insulin aspart  0-15 Units Subcutaneous TID WC  . losartan  12.5 mg Oral Daily  . pantoprazole  40 mg Oral Daily  . rifabutin  300 mg Oral 3 times weekly   Continuous:  Alvarado:[id number], acetaminophen, albuterol, ALPRAZolam, HYDROcodone-homatropine, ondansetron (ZOFRAN) IV, ondansetron, oxyCODONE,  traZODone  Assessment/Plan:  Principal Problem:   Generalized weakness Active Problems:   BRONCHIECTASIS, + MAIC   Hyponatremia   UTI (lower urinary tract infection)   DM type 2 (diabetes mellitus, type 2)    Generalized weakness Much improved. Most likely secondary to UTI. She is feeling stronger. Continue antibiotics. Seems adequately hydrated. Saline lock IV. PT/OT evaluation. TSH was normal.  Urinary tract infection Urine cultures still not final yet. Ceftriaxone will be continued. She has history of recurrent UTIs and has seen urology in the past. She may benefit from re-evaluation as outpatient. Will not discharge till we know which organism is growing due to history of reccurent UTI's.  Bronchiectasis with recent MAIC diagnosis It doesn't appear that the treatment for [facility] is responsible for her weakness. Since she is feeling better, we will continue  current course. Dr. Maple Hudson was notified of this hospitalization. LFTs are normal.   Diabetes, type II Sliding scale insulin will be provided. HbA1c is 6.6.   Hyponatremia Improved. Continue to monitor.   History of hypertension BP slightly high. Continue to monitor. Continue with Losartan but holding the diuretic due to hyponatremia.  Insomnia Start Trazodone  DVT Prophylaxis: Enoxaparin  Code Status: Full code  Family Communication: Discussed with the patient in detail  Disposition Plan: OT recommends 24 hr supervision which patient states she can arrange. She doesn't want to consider SNF. Anticipate discharge 4/12.      LOS: 2 days   [facility]  [facility] Pager [phone] 07/31/2012, 9:31 AM  If 8PM-8AM, please contact night-coverage at [email or web address], password [facility]

## 2012-08-01 DIAGNOSIS — A498 Other bacterial infections of unspecified site: Secondary | ICD-10-CM | POA: Diagnosis present

## 2012-08-01 LAB — GLUCOSE, CAPILLARY: Glucose-Capillary: 138 mg/dL — ABNORMAL HIGH (ref 70–99)

## 2012-08-01 MED ORDER — CEFPODOXIME PROXETIL 200 MG PO TABS: 200.0000 mg | ORAL_TABLET | Freq: Two times a day (BID) | ORAL | Status: DC

## 2012-08-01 MED ORDER — CEFPODOXIME PROXETIL 200 MG PO TABS
200.0000 mg | ORAL_TABLET | Freq: Two times a day (BID) | ORAL | Status: DC
  Administered 2012-08-01: 200 mg via ORAL
  Filled 2012-08-01 (×2): qty 1

## 2012-08-01 NOTE — Discharge Summary (Signed)
[facility]  Physician Discharge Summary   Patient ID: Katie Alvarado MRN: [id number] DOB/AGE: 77-Mar-1927 77 y.o.  Admit date: 07/29/2012 Discharge date: 08/01/2012  PCP: Mickie Hillier, MD  DISCHARGE DIAGNOSES:  Principal Problem:   Generalized weakness Active Problems:   BRONCHIECTASIS, + MAIC   Hyponatremia   UTI (lower urinary tract infection)   DM type 2 (diabetes mellitus, type 2)   Klebsiella infection   RECOMMENDATIONS FOR OUTPATIENT FOLLOW UP: 1. Home health RN, PT, OT will be set up 2. She may benefit from seeing Dr. Lynnae Sandhoff again for recurrent UTI's  DISCHARGE CONDITION: fair  Diet recommendation: Mod Carb  Filed Weights   07/29/12 1800  Weight: 69.037 kg (152 lb 3.2 oz)    INITIAL HISTORY: Katie Alvarado is a 79 y.o. 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 This was based on a CT scan that was done a few weeks ago. She was started on triple antibiotic treatment by Dr. Maple Hudson on March 26. Patient stated that since Friday she has had profound weakness. She has found it very difficult to walk and stand. Denied any focal weakness. Denied any falls or dizziness. She tried to stay hydrated by drinking Gatorade. She's had hot and cold sensations. Had some nausea, but no vomiting. Denied any diarrhea or abdominal pain. Denied chest pain. The shortness of breath and cough is chronic. Denied any blood in the sputum. She has a clear expectoration. She actually presented to the emergency department on Monday. She was hydrated with IV fluids, and was discharged home. However, her symptoms did not improve, and, so, she decided to come back. She also admitted that whenever she urinates she has pain and heaviness in the lower abdomen. However, denied any pain with urination, per se. Denied any fever. She had some headache last night without any vision  disturbances. She mentioned that she was admitted for similar reasons in 2012. And review of the records show that she was thought to have UTI at that time. She was also on prednisone at that time the dose of which was increased. She is no longer on steroids.   Consultations:  None  Procedures:  None  HOSPITAL COURSE:   Generalized weakness  This was most likely secondary to UTI. She has significantly improved. She will need Home health therapy. She states that she will have someone with her all the time. Her daughter will be coming in from [place] as well. TSH was normal.   Urinary tract infection Secondary to Klebsiella UA was abnormal. She was started on ceftriaxone. Cultures have been reported and she is growing Klebsiella. She will be transitioned to [place]. She may benefit from seeing her Urologist again for recurrent UTI's.   Bronchiectasis with recent MAIC diagnosis  It doesn't appear that the treatment for [facility] is responsible for her weakness. Since she is feeling better, we will continue current course. Dr. Maple Hudson was notified of this hospitalization. LFTs are normal. Continue Biaxin, Rifabutin and Ethambutol.  Diabetes, type II  HbA1c is 6.6. Continue home medications.  Hyponatremia  This improved with hydration. Was low due to dehydration.  History of hypertension  Bp is stable. Continue with home medications.   Insomnia  She had insomnia in the hospital. She was given Trazodone with relief. This will not be prescribed to her.   Overall patient has improved. She is feeling better. She has been ambulating. Home  health will be arranged. She is ready for discharge.   PERTINENT LABS:  The results of significant diagnostics from this hospitalization (including imaging, microbiology, ancillary and laboratory) are listed below for reference.    Microbiology: Recent Results (from the past 240 hour(s))  URINE CULTURE     Status: None   Collection Time    07/29/12  2:48  PM      Result Value Range Status   Specimen Description URINE, CLEAN CATCH   Final   Special Requests NONE   Final   Culture  Setup Time 07/29/2012 22:56   Final   Colony Count >=100,000 COLONIES/ML   Final   Culture KLEBSIELLA PNEUMONIAE   Final   Report Status 07/31/2012 FINAL   Final   Organism ID, Bacteria KLEBSIELLA PNEUMONIAE   Final     Labs: Basic Metabolic Panel:  Recent Labs Lab 07/27/12 1424 07/29/12 1420 07/30/12 0401 07/31/12 0346  NA 126* 132* 133* 133*  K 4.3 4.1 3.5 3.4*  CL 86* 92* 96 96  CO2 29 30 28 29   GLUCOSE 108* 116* 90 110*  BUN 12 12 11 10   CREATININE 0.64 0.66 0.75 0.70  CALCIUM 9.0 9.7 8.8 8.5   Liver Function Tests:  Recent Labs Lab 07/27/12 1424 07/29/12 1420 07/30/12 0401  AST 24 22 18   ALT 13 12 11   ALKPHOS 60 64 48  BILITOT 0.2* 0.2* 0.2*  PROT 7.1 7.0 5.9*  ALBUMIN 3.6 3.7 3.1*   CBC:  Recent Labs Lab 07/27/12 1424 07/29/12 1420 07/30/12 0401 07/31/12 0346  WBC 4.3 4.0 3.4* 3.3*  NEUTROABS 2.1 2.0  --   --   HGB 12.9 12.6 11.6* 11.8*  HCT 37.9 37.3 34.5* 35.5*  MCV 84.6 85.4 85.6 85.1  PLT 242 231 186 172   CBG:  Recent Labs Lab 07/31/12 1202 07/31/12 1302 07/31/12 1741 07/31/12 2119 08/01/12 0712  GLUCAP 120* 107* 215* 83 138*     IMAGING STUDIES Dg Chest 2 View  07/29/2012  *RADIOLOGY REPORT*  Clinical Data: Productive cough, diabetic  CHEST - 2 VIEW  Comparison: Prior chest x-ray 07/27/2012  Findings: Stable appearance of patchy opacities and varicose bronchiectasis in the lingula, and dependent upper and lower lobes bilaterally.  No significant interval change compared to the recent prior chest x-ray.  Stable blunting of the left costophrenic angle. Cardiac and mediastinal contours remain within normal limits. There is atherosclerotic calcification of the transverse aorta.  No new large pleural effusion, pneumothorax or definite airspace consolidation.  No acute osseous abnormality.  IMPRESSION:  1.  No  acute cardiopulmonary process 2. No significant interval change in the appearance of known varicose bronchiectasis and scarring within the lingula and the dependent aspects of the upper and lower lobes bilaterally.   Original Report Authenticated By: Malachy Moan, M.D.    Dg Chest 2 View  07/27/2012  *RADIOLOGY REPORT*  Clinical Data: Fatigue, weakness for 3 days  CHEST - 2 VIEW  Comparison: CT chest of 06/01/2012 and chest x-ray of 05/28/2012  Findings: Nodularity in both mid lungs and the left lung base is again noted.  CT has demonstrate that these nodules represent bronchiectasis, and scarring.  No new parenchymal process is seen and no pleural effusion is noted.  Heart size is stable.  No mediastinal or hilar adenopathy is noted.  No bony abnormality is seen.  IMPRESSION: Stable changes of bronchiectasis and scarring.  No definite active process.   Original Report Authenticated By: Dwyane Dee, M.D.  DISCHARGE EXAMINATION: Filed Vitals:   07/31/12 1454 07/31/12 1928 07/31/12 2053 08/01/12 0528  BP: 140/75  126/64 125/61  Pulse: 77  78 83  Temp: 98.3 F (36.8 C)  98.5 F (36.9 C) 98.8 F (37.1 C)  TempSrc: Oral  Oral Oral  Resp: 18  18 18   Height:      Weight:      SpO2: 96% 98% 94% 94%   General appearance: alert, cooperative, appears stated age and no distress Resp: Coarse breath sounds bilaterally. No definite crackles Cardio: regular rate and rhythm, S1, S2 normal, no murmur, click, rub or gallop GI: soft, non-tender; bowel sounds normal; no masses,  no organomegaly Neurologic: Alert and oriented x 3. No focal deficits.  DISPOSITION: Home with home health  Discharge Orders   Future Appointments Provider Department Dept Phone   08/31/2012 2:45 PM Waymon Budge, MD [facility] [phone]   09/25/2012 1:30 PM Waymon Budge, MD [facility] [phone]   Future Orders Complete By Expires     Diet Carb Modified  As directed     Discharge  instructions  As directed     Comments:      Please follow up with your PCP and Dr. Lynnae Sandhoff for recurrent UTI's. Your urine grew Klebsiella which is a type of bacteria.    Increase activity slowly  As directed       Current Discharge Medication List    START taking these medications   Details  cefpodoxime (VANTIN) 200 MG tablet Take 1 tablet (200 mg total) by mouth every 12 (twelve) hours. Starting 4/12 at 9PM for 7 days Qty: 14 tablet, Refills: 0      CONTINUE these medications which have NOT CHANGED   Details  albuterol (PROAIR HFA) 108 (90 BASE) MCG/ACT inhaler INHALE 2 PUFFS BY MOUTH EVERY 4 HOURS AS NEEDED FOR SHORTNESS OF BREATH Qty: 1 Inhaler, Refills: 3    allopurinol (ZYLOPRIM) 100 MG tablet Take 100 mg by mouth daily.      ALPRAZolam (XANAX) 0.25 MG tablet Take 0.25 mg by mouth 3 (three) times daily as needed. For anxiety.    aspirin 81 MG tablet Take 81 mg by mouth daily.      budesonide (PULMICORT) 0.25 MG/2ML nebulizer solution Take 2 mLs (0.25 mg total) by nebulization daily. Qty: 60 mL, Refills: 12    cholecalciferol (VITAMIN D) 1000 UNITS tablet Take 2,000 Units by mouth daily.      Cinnamon 500 MG capsule Take 500 mg by mouth daily.    clarithromycin (BIAXIN) 500 MG tablet Take 500 mg by mouth 3 (three) times a week. Monday, Wednesday, and Friday.    esomeprazole (NEXIUM) 40 MG capsule Take 40 mg by mouth daily.      ethambutol (MYAMBUTOL) 400 MG tablet Take 1,200 mg by mouth 3 (three) times a week. Monday, Weds, Friday    ipratropium (ATROVENT) 0.02 % nebulizer solution Take 500 mcg by nebulization 4 (four) times daily as needed.    losartan (COZAAR) 25 MG tablet Take 12.5 mg by mouth daily.     meclizine (ANTIVERT) 25 MG tablet Take 25 mg by mouth 3 (three) times daily as needed. For dizziness.    metFORMIN (GLUCOPHAGE) 500 MG tablet Take 750 mg by mouth 2 (two) times daily.     Omega-3 Fatty Acids (FISH OIL) 1000 MG CAPS Take 1 capsule by mouth  daily.    ondansetron (ZOFRAN ODT) 8 MG disintegrating tablet Take 1 tablet (8 mg  total) by mouth every 8 (eight) hours as needed for nausea. Qty: 10 tablet, Refills: 0    ONE TOUCH ULTRA TEST test strip Use as directed to check blood glucose    rifabutin (MYCOBUTIN) 150 MG capsule Take 300 mg by mouth 3 (three) times a week. 2 capsules ( 300 mg total) every Monday, Weds, Friday    traMADol (ULTRAM) 50 MG tablet Take 50-100 mg by mouth every 6 (six) hours as needed (for cough).    triamterene-hydrochlorothiazide (MAXZIDE) 75-50 MG per tablet Take 0.5 tablets by mouth daily.     vitamin E 400 UNIT capsule Take 400 Units by mouth daily.    HYDROcodone-homatropine (HYCODAN) 5-1.5 MG/5ML syrup Take 5 mLs by mouth every 6 (six) hours as needed for cough. Qty: 180 mL, Refills: 0       Follow-up Information   Follow up with Mickie Hillier, MD. Schedule an appointment as soon as possible for a visit in 5 days.   Contact information:   [address] [place] [address] [phone]       Schedule an appointment as soon as possible for a visit with Chelsea Aus, MD. (for recurrent UTI's)    Contact information:   [address] [place] [address] [phone]       TOTAL DISCHARGE TIME: 35 mins  [facility]  [facility] Pager [phone]  08/01/2012, 8:32 AM

## 2012-08-01 NOTE — Progress Notes (Signed)
08/01/12 0945 Reviewed discharged instructions with patient. Patient was given copy of discharge papers and prescription. Patient verbalized understanding of d/c instructions and will make follow up appt.

## 2012-08-03 ENCOUNTER — Inpatient Hospital Stay (HOSPITAL_COMMUNITY)
Admission: EM | Admit: 2012-08-03 | Discharge: 2012-08-05 | DRG: 392 | Disposition: A | Discharge status: Home Health | Payer: Medicare Other | Attending: Internal Medicine | Admitting: Internal Medicine

## 2012-08-03 ENCOUNTER — Encounter (HOSPITAL_COMMUNITY): Payer: Self-pay | Admitting: Emergency Medicine

## 2012-08-03 ENCOUNTER — Encounter: Payer: Self-pay | Admitting: Internal Medicine

## 2012-08-03 DIAGNOSIS — B961 Klebsiella pneumoniae [K. pneumoniae] as the cause of diseases classified elsewhere: Secondary | ICD-10-CM | POA: Diagnosis present

## 2012-08-03 DIAGNOSIS — K219 Gastro-esophageal reflux disease without esophagitis: Secondary | ICD-10-CM | POA: Diagnosis present

## 2012-08-03 DIAGNOSIS — E871 Hypo-osmolality and hyponatremia: Secondary | ICD-10-CM | POA: Diagnosis present

## 2012-08-03 DIAGNOSIS — E1165 Type 2 diabetes mellitus with hyperglycemia: Secondary | ICD-10-CM | POA: Diagnosis present

## 2012-08-03 DIAGNOSIS — M79605 Pain in left leg: Secondary | ICD-10-CM

## 2012-08-03 DIAGNOSIS — M79609 Pain in unspecified limb: Secondary | ICD-10-CM | POA: Diagnosis not present

## 2012-08-03 DIAGNOSIS — Z794 Long term (current) use of insulin: Secondary | ICD-10-CM

## 2012-08-03 DIAGNOSIS — E860 Dehydration: Secondary | ICD-10-CM

## 2012-08-03 DIAGNOSIS — A318 Other mycobacterial infections: Secondary | ICD-10-CM | POA: Diagnosis present

## 2012-08-03 DIAGNOSIS — R197 Diarrhea, unspecified: Secondary | ICD-10-CM | POA: Diagnosis not present

## 2012-08-03 DIAGNOSIS — J471 Bronchiectasis with (acute) exacerbation: Secondary | ICD-10-CM | POA: Diagnosis present

## 2012-08-03 DIAGNOSIS — Z66 Do not resuscitate: Secondary | ICD-10-CM | POA: Diagnosis present

## 2012-08-03 DIAGNOSIS — R531 Weakness: Secondary | ICD-10-CM | POA: Diagnosis present

## 2012-08-03 DIAGNOSIS — M79604 Pain in right leg: Secondary | ICD-10-CM | POA: Diagnosis present

## 2012-08-03 DIAGNOSIS — R630 Anorexia: Secondary | ICD-10-CM | POA: Diagnosis present

## 2012-08-03 DIAGNOSIS — R5381 Other malaise: Secondary | ICD-10-CM | POA: Diagnosis present

## 2012-08-03 DIAGNOSIS — A088 Other specified intestinal infections: Secondary | ICD-10-CM | POA: Diagnosis present

## 2012-08-03 DIAGNOSIS — N390 Urinary tract infection, site not specified: Secondary | ICD-10-CM | POA: Diagnosis not present

## 2012-08-03 DIAGNOSIS — E119 Type 2 diabetes mellitus without complications: Secondary | ICD-10-CM | POA: Diagnosis not present

## 2012-08-03 DIAGNOSIS — R5383 Other fatigue: Secondary | ICD-10-CM | POA: Diagnosis present

## 2012-08-03 DIAGNOSIS — A498 Other bacterial infections of unspecified site: Secondary | ICD-10-CM | POA: Diagnosis present

## 2012-08-03 DIAGNOSIS — J479 Bronchiectasis, uncomplicated: Secondary | ICD-10-CM | POA: Diagnosis not present

## 2012-08-03 LAB — COMPREHENSIVE METABOLIC PANEL
Albumin: 3.8 g/dL (ref 3.5–5.2)
BUN: 13 mg/dL (ref 6–23)
Calcium: 9.5 mg/dL (ref 8.4–10.5)
Creatinine, Ser: 0.66 mg/dL (ref 0.50–1.10)
GFR calc Af Amer: >90 mL/min (ref >90)
Glucose, Bld: 107 mg/dL — ABNORMAL HIGH (ref 70–99)
Total Protein: 7.2 g/dL (ref 6.0–8.3)

## 2012-08-03 LAB — CBC WITH DIFFERENTIAL/PLATELET
Basophils Relative: 0 % (ref 0–1)
Eosinophils Absolute: 0.1 10*3/uL (ref 0.0–0.7)
Eosinophils Relative: 1 % (ref 0–5)
HCT: 39.9 % (ref 36.0–46.0)
Hemoglobin: 13.7 g/dL (ref 12.0–15.0)
MCH: 29.1 pg (ref 26.0–34.0)
MCHC: 34.3 g/dL (ref 30.0–36.0)
MCV: 84.9 fL (ref 78.0–100.0)
Monocytes Absolute: 0.7 10*3/uL (ref 0.1–1.0)
Monocytes Relative: 13 % — ABNORMAL HIGH (ref 3–12)
Neutrophils Relative %: 56 % (ref 43–77)

## 2012-08-03 LAB — GLUCOSE, CAPILLARY: Glucose-Capillary: 104 mg/dL — ABNORMAL HIGH (ref 70–99)

## 2012-08-03 LAB — URINALYSIS, ROUTINE W REFLEX MICROSCOPIC
Leukocytes, UA: NEGATIVE
Nitrite: NEGATIVE
Specific Gravity, Urine: 1.007 (ref 1.005–1.030)
Urobilinogen, UA: 0.2 mg/dL (ref 0.0–1.0)
pH: 7 (ref 5.0–8.0)

## 2012-08-03 MED ORDER — ALLOPURINOL 100 MG PO TABS
100.0000 mg | ORAL_TABLET | Freq: Every morning | ORAL | Status: DC
  Administered 2012-08-04 – 2012-08-05 (×2): 100 mg via ORAL
  Filled 2012-08-03 (×2): qty 1

## 2012-08-03 MED ORDER — SODIUM CHLORIDE 0.9 % IV BOLUS (SEPSIS)
1000.0000 mL | Freq: Once | INTRAVENOUS | Status: AC
  Administered 2012-08-03: 1000 mL via INTRAVENOUS

## 2012-08-03 MED ORDER — ASPIRIN EC 81 MG PO TBEC
81.0000 mg | DELAYED_RELEASE_TABLET | Freq: Every morning | ORAL | Status: DC
  Administered 2012-08-04 – 2012-08-05 (×2): 81 mg via ORAL
  Filled 2012-08-03 (×2): qty 1

## 2012-08-03 MED ORDER — SODIUM CHLORIDE 0.9 % IV SOLN
INTRAVENOUS | Status: AC
  Administered 2012-08-03: 18:00:00 via INTRAVENOUS

## 2012-08-03 MED ORDER — SODIUM CHLORIDE 0.9 % IV SOLN
INTRAVENOUS | Status: DC
  Administered 2012-08-04 – 2012-08-05 (×2): via INTRAVENOUS

## 2012-08-03 MED ORDER — ENOXAPARIN SODIUM 30 MG/0.3ML ~~LOC~~ SOLN
30.0000 mg | SUBCUTANEOUS | Status: DC
  Administered 2012-08-03: 30 mg via SUBCUTANEOUS
  Filled 2012-08-03 (×2): qty 0.3

## 2012-08-03 MED ORDER — ALPRAZOLAM 0.25 MG PO TABS
0.2500 mg | ORAL_TABLET | Freq: Three times a day (TID) | ORAL | Status: DC | PRN
  Administered 2012-08-03 – 2012-08-04 (×2): 0.25 mg via ORAL
  Filled 2012-08-03 (×2): qty 1

## 2012-08-03 MED ORDER — CLARITHROMYCIN 500 MG PO TABS
500.0000 mg | ORAL_TABLET | ORAL | Status: DC
  Administered 2012-08-03 – 2012-08-05 (×2): 500 mg via ORAL
  Filled 2012-08-03 (×3): qty 1

## 2012-08-03 MED ORDER — ONDANSETRON HCL 4 MG/2ML IJ SOLN: 4.0000 mg | Freq: Four times a day (QID) | INTRAMUSCULAR | Status: AC | PRN

## 2012-08-03 MED ORDER — ALBUTEROL SULFATE HFA 108 (90 BASE) MCG/ACT IN AERS
2.0000 | INHALATION_SPRAY | Freq: Four times a day (QID) | RESPIRATORY_TRACT | Status: DC | PRN
Start: 2012-08-03 — End: 2012-08-05
  Filled 2012-08-03: qty 6.7

## 2012-08-03 MED ORDER — PANTOPRAZOLE SODIUM 40 MG PO TBEC
40.0000 mg | DELAYED_RELEASE_TABLET | Freq: Every day | ORAL | Status: DC
  Administered 2012-08-04 – 2012-08-05 (×2): 40 mg via ORAL
  Filled 2012-08-03 (×2): qty 1

## 2012-08-03 MED ORDER — SODIUM CHLORIDE 0.9 % IV BOLUS (SEPSIS): 1000.0000 mL | Freq: Once | INTRAVENOUS | Status: DC

## 2012-08-03 MED ORDER — ONDANSETRON HCL 4 MG/2ML IJ SOLN: 4.0000 mg | Freq: Three times a day (TID) | INTRAMUSCULAR | Status: DC | PRN

## 2012-08-03 MED ORDER — RIFABUTIN 150 MG PO CAPS
300.0000 mg | ORAL_CAPSULE | ORAL | Status: DC
  Administered 2012-08-05: 300 mg via ORAL
  Filled 2012-08-03 (×2): qty 2

## 2012-08-03 MED ORDER — BUDESONIDE 0.25 MG/2ML IN SUSP
0.2500 mg | Freq: Every day | RESPIRATORY_TRACT | Status: DC
  Administered 2012-08-03 – 2012-08-05 (×3): 0.25 mg via RESPIRATORY_TRACT
  Filled 2012-08-03 (×5): qty 2

## 2012-08-03 MED ORDER — INSULIN ASPART 100 UNIT/ML ~~LOC~~ SOLN
0.0000 [IU] | Freq: Three times a day (TID) | SUBCUTANEOUS | Status: DC
  Administered 2012-08-04: 2 [IU] via SUBCUTANEOUS

## 2012-08-03 MED ORDER — ETHAMBUTOL HCL 400 MG PO TABS
1200.0000 mg | ORAL_TABLET | ORAL | Status: DC
  Administered 2012-08-03 – 2012-08-05 (×2): 1200 mg via ORAL
  Filled 2012-08-03 (×3): qty 3

## 2012-08-03 MED ORDER — LOSARTAN POTASSIUM 25 MG PO TABS
12.5000 mg | ORAL_TABLET | Freq: Every morning | ORAL | Status: DC
  Administered 2012-08-04 – 2012-08-05 (×2): 12.5 mg via ORAL
  Filled 2012-08-03 (×2): qty 0.5

## 2012-08-03 NOTE — ED Provider Notes (Signed)
See prior note   Ward Givens, MD 08/03/12 [phone]

## 2012-08-03 NOTE — ED Notes (Signed)
Attempted to ambulate patient in hallway. Patient unsteady, and unable to walk RN notified.

## 2012-08-03 NOTE — ED Notes (Signed)
Report called to 5th floor, Medical sales representative

## 2012-08-03 NOTE — ED Provider Notes (Signed)
Patient was recently admitted for weakness and UTI. She was discharged 2 days ago. She however started having diarrhea last night. Her family reports she is so weak she cannot walk without assistance.  Pt is pale, alert and cooperative  Medical screening examination/treatment/procedure(s) were conducted as a shared visit with non-physician practitioner(s) and myself.  I personally evaluated the patient during the encounter  Devoria Albe, MD, Franz Dell, MD 08/03/12 [phone]

## 2012-08-03 NOTE — ED Provider Notes (Signed)
History     CSN: [id number]  Arrival date & time 08/03/12  1138   First MD Initiated Contact with Patient 08/03/12 1229      Chief Complaint  Patient presents with  . Diarrhea    (Consider location/radiation/quality/duration/timing/severity/associated sxs/prior treatment) HPI  79 year old female with history of diabetes, GERD, and recent UTI currently on antibiotic presents complaining of diarrhea. Patient reports she was diagnosed with having urinary tract infection on Wednesday and stayed in hospital for 3 days. She was discharged 2 days ago. Since being discharged, pt has been feeling very weak.  Last night begin to develop diarrhea. Reports having more than 10 bouts of dark loose stools. She denies fever, chills, chest pain, shortness of breath, cough, hemoptysis, nausea, vomiting, abdominal pain, back pain, dysuria. She is currently taking antibiotic for her urinary tract infection. 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 Antibiotics currently taking include Vantin, Biaxin, ethambutol, Mycobutin.    Past Medical History  Diagnosis Date  . Diabetes mellitus   . Other symptoms involving nervous and musculoskeletal systems   . Insomnia, unspecified   . Cough   . Bronchiectasis with acute exacerbation   . Acute nasopharyngitis (common cold)   . Esophageal reflux     Past Surgical History  Procedure Laterality Date  . Foot    . Abdominal hysterectomy    . Colon surgery    . Appendectomy      Family History  Problem Relation Age of Onset  . Chronic bronchitis Father   . Other Sister     heart trouble  . Other Brother     heart trouble    History  Substance Use Topics  . Smoking status: Never Smoker   . Smokeless tobacco: Never Used  . Alcohol Use: No    OB History   Grav Para Term Preterm Abortions TAB SAB Ect Mult Living                  Review of Systems   Constitutional:       10 Systems reviewed and all are negative for acute change except as noted in the HPI.     Allergies  Welchol; Glucosamine; and Celecoxib  Home Medications   Current Outpatient Rx  Name  Route  Sig  Dispense  Refill  . allopurinol (ZYLOPRIM) 100 MG tablet   Oral   Take 100 mg by mouth daily.           Marland Kitchen ALPRAZolam (XANAX) 0.25 MG tablet   Oral   Take 0.25 mg by mouth 3 (three) times daily as needed. For anxiety.         . budesonide (PULMICORT) 0.25 MG/2ML nebulizer solution   Nebulization   Take 2 mLs (0.25 mg total) by nebulization daily.   60 mL   12   . cefpodoxime (VANTIN) 200 MG tablet   Oral   Take 1 tablet (200 mg total) by mouth every 12 (twelve) hours. Starting 4/12 at 9PM for 7 days   14 tablet   0   . cholecalciferol (VITAMIN D) 1000 UNITS tablet   Oral   Take 2,000 Units by mouth daily.           . Cinnamon 500 MG capsule   Oral   Take 500 mg by mouth daily.         . clarithromycin (BIAXIN) 500 MG tablet   Oral  Take 500 mg by mouth 3 (three) times a week. Monday, Wednesday, and Friday.         . esomeprazole (NEXIUM) 40 MG capsule   Oral   Take 40 mg by mouth daily.           Marland Kitchen ethambutol (MYAMBUTOL) 400 MG tablet   Oral   Take 1,200 mg by mouth 3 (three) times a week. Monday, Weds, Friday         . HYDROcodone-homatropine (HYCODAN) 5-1.5 MG/5ML syrup   Oral   Take 5 mLs by mouth every 6 (six) hours as needed for cough.   180 mL   0   . ipratropium (ATROVENT) 0.02 % nebulizer solution   Nebulization   Take 500 mcg by nebulization 4 (four) times daily as needed.         Marland Kitchen losartan (COZAAR) 25 MG tablet   Oral   Take 12.5 mg by mouth daily.          . meclizine (ANTIVERT) 25 MG tablet   Oral   Take 25 mg by mouth 3 (three) times daily as needed. For dizziness.         . metFORMIN (GLUCOPHAGE) 500 MG tablet   Oral   Take 750 mg by mouth 2 (two) times daily.          . Omega-3 Fatty  Acids (FISH OIL) 1000 MG CAPS   Oral   Take 1 capsule by mouth daily.         . ondansetron (ZOFRAN ODT) 8 MG disintegrating tablet   Oral   Take 1 tablet (8 mg total) by mouth every 8 (eight) hours as needed for nausea.   10 tablet   0   . ONE TOUCH ULTRA TEST test strip      Use as directed to check blood glucose         . rifabutin (MYCOBUTIN) 150 MG capsule   Oral   Take 300 mg by mouth 3 (three) times a week. 2 capsules ( 300 mg total) every Monday, Weds, Friday         . traMADol (ULTRAM) 50 MG tablet   Oral   Take 50-100 mg by mouth every 6 (six) hours as needed (for cough).         . triamterene-hydrochlorothiazide (MAXZIDE) 75-50 MG per tablet   Oral   Take 0.5 tablets by mouth daily.          . vitamin E 400 UNIT capsule   Oral   Take 400 Units by mouth daily.           BP 134/64  Pulse 70  Temp(Src) 97.8 F (36.6 C) (Oral)  Resp 18  SpO2 97%  Physical Exam  Nursing note and vitals reviewed. Constitutional: She is oriented to person, place, and time. She appears well-developed and well-nourished. No distress.  Awake, alert, nontoxic appearance  HENT:  Head: Atraumatic.  Mouth/Throat: Oropharynx is clear and moist.  Eyes: Conjunctivae are normal. Right eye exhibits no discharge. Left eye exhibits no discharge.  Neck: Neck supple.  Cardiovascular: Normal rate and regular rhythm.   Pulmonary/Chest: Effort normal. No respiratory distress. She exhibits no tenderness.  Abdominal: Soft. Bowel sounds are normal. There is no tenderness. There is no rebound.  Musculoskeletal: She exhibits no tenderness.  ROM appears intact, no obvious focal weakness  Neurological: She is alert and oriented to person, place, and time.  Mental status and motor strength appears intact  5/5  strength to all 4 extremities  Skin: No rash noted.  Psychiatric: She has a normal mood and affect.    ED Course  Procedures (including critical care time)  1:29 PM Patient  is currently been treated for urinary tract infection with abx. She also is taking 3 separate types of antibiotics for a lung disease.  Developed persistent diarrhea concerning for c.dif.  Pt currently placed on contact isolation, IVF given, labs ordered.  Will continue to monitor and work up.  She is afebrile with stable normal VS.  Care discussed with attending  3:50 PM Not had a normal bowel movement for the past 2 hours. We'll continue with IV fluid. Hemoccult negative. We are trying to obtain C. difficile culture.  Pt currently in NAD, VSS, afebrile.    4:08 PM Although her labs are without acute changes, family members felt that pt is too weak to be discharged.  My attending has evaluate pt and felt she may benefit from observation by [facility].    Labs Reviewed  CBC WITH DIFFERENTIAL - Abnormal; Notable for the following:    Monocytes Relative 13 (*)    All other components within normal limits  COMPREHENSIVE METABOLIC PANEL - Abnormal; Notable for the following:    Sodium 132 (*)    Chloride 93 (*)    Glucose, Bld 107 (*)    AST 43 (*)    ALT 36 (*)    GFR calc non Af Amer 79 (*)    All other components within normal limits  GLUCOSE, CAPILLARY - Abnormal; Notable for the following:    Glucose-Capillary 109 (*)    All other components within normal limits  CLOSTRIDIUM DIFFICILE BY PCR  URINALYSIS, ROUTINE W REFLEX MICROSCOPIC  OCCULT BLOOD X 1 CARD TO LAB, STOOL  OCCULT BLOOD, POC DEVICE   No results found.   1. Generalized weakness   2. UTI (lower urinary tract infection)   3. Diarrhea       MDM  BP 140/77  Pulse 94  Temp(Src) 97.8 F (36.6 C) (Oral)  Resp 18  SpO2 97%  I have reviewed nursing notes and vital signs. I personally reviewed the imaging tests through PACS system  I reviewed available ER/hospitalization records thought the EMR         Fayrene Helper, [place] 08/03/12 1738

## 2012-08-03 NOTE — ED Notes (Signed)
Pt reports "feeling much better".

## 2012-08-03 NOTE — Progress Notes (Signed)
[facility] ED CM noted pt with recent d/c home on 08/01/12 Reviewed EPIC notes Home health had been discussed with pt but it appears she refused services at d/c  CM spoke with pt who informed her she did not prefer home health services upon d/c on 08/01/12 At her bedside is a female and female friend "good Friends" not family who confirms pt lived alone Reports pt continued to get weaker at home and was not eating or drinking CM reviewed in details medicare guidelines, home health (HH) (length of stay in home, types of [facility] staff available, coverage, primary caregiver, up to 24 hrs before services may be started) and Private duty nursing (PDN-coverage, length of stay in the home types of staff available) Answered various questions for pt about home health  Choice offered to / List presented to pt:

## 2012-08-03 NOTE — ED Notes (Signed)
ONG:[id number]<WU> Expected date:<BR> Expected time:<BR> Means of arrival:<BR> Comments:<BR> Resende

## 2012-08-03 NOTE — ED Notes (Signed)
MD at bedside. 

## 2012-08-03 NOTE — ED Notes (Signed)
Patient reports diarrhea since last night.  No emesis or fevers noted.  Patient also reports weakness and has a h/o DM.  Patient is on ABX for UTI.

## 2012-08-03 NOTE — H&P (Addendum)
[facility] History and Physical  Katie Alvarado ZOX:[id number] DOB: 1926/02/29 DOA: 08/03/2012  Referring physician: EDP PCP: Mickie Hillier, MD  Specialists: Fannie Knee MD  Chief Complaint: weakness, diarrhea  HPI: Katie Alvarado is a 78 y.o. 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 Maple Hudson since 3/26. She was just discharged from [facility] 4/12 after evaluation and treatment of UTI and generalized weakness, on PO VAntin to complete a 7 day course. She reports acute onset of severe watery diarrhea last night, non bloody or bilious, about 10-15 episodes since then. She denies any vomiting, abdominal pain, fevers or chills. She also reports anorexia, poor PO and worsening fatigue   Review of Systems: The patient denies anorexia, fever, weight loss,, vision loss, decreased hearing, hoarseness, chest pain, syncope, dyspnea on exertion, peripheral edema, balance deficits, hemoptysis, abdominal pain, melena, hematochezia, severe indigestion/heartburn, hematuria, incontinence, genital sores, muscle weakness, suspicious skin lesions, transient blindness, difficulty walking, depression, unusual weight change, abnormal bleeding, enlarged lymph nodes, angioedema, and breast masses.    Past Medical History  Diagnosis Date  . Diabetes mellitus   . Other symptoms involving nervous and musculoskeletal systems   . Insomnia, unspecified   . Cough   . Bronchiectasis with acute exacerbation   . Acute nasopharyngitis (common cold)   . Esophageal reflux    Past Surgical History  Procedure Laterality Date  . Foot    . Abdominal hysterectomy    . Colon surgery    . Appendectomy     Social History:  reports that she has never smoked. She has never used smokeless tobacco. She reports that she does not drink alcohol or use illicit drugs. Lives at home by her self  Allergies  Allergen Reactions  . Welchol  (Colesevelam Hcl) Hives  . Glucosamine Other (See Comments)    Unknown  . Celecoxib Hives, Itching and Rash    Family History  Problem Relation Age of Onset  . Chronic bronchitis Father   . Other Sister     heart trouble  . Other Brother     heart trouble   Prior to Admission medications   Medication Sig Start Date End Date Taking? Authorizing Provider  albuterol (PROVENTIL HFA;VENTOLIN HFA) 108 (90 BASE) MCG/ACT inhaler Inhale 2 puffs into the lungs every 6 (six) hours as needed for wheezing or shortness of breath.   Yes Historical Provider, MD  allopurinol (ZYLOPRIM) 100 MG tablet Take 100 mg by mouth every morning.    Yes Historical Provider, MD  ALPRAZolam (XANAX) 0.25 MG tablet Take 0.25 mg by mouth 3 (three) times daily as needed. For anxiety.   Yes Historical Provider, MD  aspirin EC 81 MG tablet Take 81 mg by mouth every morning.   Yes Historical Provider, MD  budesonide (PULMICORT) 0.25 MG/2ML nebulizer solution Take 2 mLs (0.25 mg total) by nebulization daily. 05/28/12 05/28/13 Yes Waymon Budge, MD  cefpodoxime (VANTIN) 200 MG tablet Take 200 mg by mouth every 12 (twelve) hours. Starting 4/12 at 9PM for 7 days 08/01/12  Yes Osvaldo Shipper, MD  cholecalciferol (VITAMIN D) 1000 UNITS tablet Take 2,000 Units by mouth every morning.    Yes Historical Provider, MD  Cinnamon 500 MG capsule Take 500 mg by mouth every morning.    Yes Historical Provider, MD  clarithromycin (BIAXIN) 500 MG tablet Take 500 mg by mouth 3 (three) times a week. Monday, Wednesday, and Friday. 07/14/12  Yes Clinton D Young,  MD  esomeprazole (NEXIUM) 40 MG capsule Take 40 mg by mouth every morning.    Yes Historical Provider, MD  ethambutol (MYAMBUTOL) 400 MG tablet Take 1,200 mg by mouth 3 (three) times a week. Monday, Weds, Friday 07/14/12  Yes Clinton D Young, MD  ipratropium (ATROVENT) 0.02 % nebulizer solution Take 500 mcg by nebulization 4 (four) times daily as needed. 05/28/12 05/28/13 Yes Waymon Budge, MD   losartan (COZAAR) 25 MG tablet Take 12.5 mg by mouth every morning.    Yes Historical Provider, MD  metFORMIN (GLUCOPHAGE) 500 MG tablet Take 750 mg by mouth 2 (two) times daily.    Yes Historical Provider, MD  Omega-3 Fatty Acids (FISH OIL) 1000 MG CAPS Take 1 capsule by mouth daily.   Yes Historical Provider, MD  ondansetron (ZOFRAN ODT) 8 MG disintegrating tablet Take 1 tablet (8 mg total) by mouth every 8 (eight) hours as needed for nausea. 07/27/12  Yes Lyanne Co, MD  rifabutin (MYCOBUTIN) 150 MG capsule Take 300 mg by mouth 3 (three) times a week. 2 capsules ( 300 mg total) every Monday, Weds, Friday 07/14/12  Yes Waymon Budge, MD  triamterene-hydrochlorothiazide [facility]) 75-50 MG per tablet Take 0.5 tablets by mouth every morning.  09/18/11  Yes Historical Provider, MD  vitamin E 400 UNIT capsule Take 400 Units by mouth daily.   Yes Historical Provider, MD   Physical Exam: Filed Vitals:   08/03/12 1204 08/03/12 1338 08/03/12 1340 08/03/12 1342  BP: 134/64 142/67 152/76 140/77  Pulse: 70 69 79 94  Temp: 97.8 F (36.6 C)     TempSrc: Oral     Resp: 18     SpO2: 97%        General: AAOx3, no distress  HEENT: PERRLA, EOMi, oral mucosa dry  Cardiovascular: S1S2/RRR  Respiratory: CTAB  Abdomen: spft, NT, BS present, no rigidity/rebound, no CVA tenderness  Skin: no rashes or skin breakdown  EXt: no edema c/c  Neurologic: moves all extremities, non focal  Labs on Admission:  Basic Metabolic Panel:  Recent Labs Lab 07/29/12 1420 07/30/12 0401 07/31/12 0346 08/03/12 1220  NA 132* 133* 133* 132*  K 4.1 3.5 3.4* 3.9  CL 92* 96 96 93*  CO2 30 28 29 28   GLUCOSE 116* 90 110* 107*  BUN 12 11 10 13   CREATININE 0.66 0.75 0.70 0.66  CALCIUM 9.7 8.8 8.5 9.5   Liver Function Tests:  Recent Labs Lab 07/29/12 1420 07/30/12 0401 08/03/12 1220  AST 22 18 43*  ALT 12 11 36*  ALKPHOS 64 48 60  BILITOT 0.2* 0.2* 0.3  PROT 7.0 5.9* 7.2  ALBUMIN 3.7 3.1* 3.8   No  results found for this basename: LIPASE, AMYLASE,  in the last 168 hours No results found for this basename: AMMONIA,  in the last 168 hours CBC:  Recent Labs Lab 07/29/12 1420 07/30/12 0401 07/31/12 0346 08/03/12 1220  WBC 4.0 3.4* 3.3* 4.9  NEUTROABS 2.0  --   --  2.7  HGB 12.6 11.6* 11.8* 13.7  HCT 37.3 34.5* 35.5* 39.9  MCV 85.4 85.6 85.1 84.9  PLT 231 186 172 211   Cardiac Enzymes: No results found for this basename: CKTOTAL, CKMB, CKMBINDEX, TROPONINI,  in the last 168 hours  BNP (last 3 results) No results found for this basename: PROBNP,  in the last 8760 hours CBG:  Recent Labs Lab 07/31/12 1302 07/31/12 1741 07/31/12 2119 08/01/12 0712 08/03/12 1637  GLUCAP 107* 215* 83 138*  109*    Radiological Exams on Admission: No results found.    Assessment/Plan  1. Diarrhea: -  Viral gastroenteritis vs Cdiff vs antibiotic associated - Supportive care, IVF, antiemetics - clear liquid diet, advance as tolerated - check Cdiff PCR, GI pathogen panel  2.  Bronchiectasis with MAC - continue Biaxin, Ethambutol and Rifabutin, started on 3/26 per Dr. Maple Hudson - this is potentially contributing to her weakness as well  3. DM:  - Hbaic 6.6, last admission - Hold metformin, SSI for now   4. Klebsiella UTI -completed 6 days total of antibiotics including hospitalization -DC VAntin  5. HTN:  - stable - hold HCTZ for now  6. Generalized weakness: -secondary to 1, 2 -Pt/OT  DVT proph: Enoxaparin   Code Status: DNR Family Communication: d/w pt and daughter at bedside Disposition Plan: Inpatient  Time spent:  [facility] Pager [phone]  If 7PM-7AM, please contact night-coverage [email or web address] Password [facility] 08/03/2012, 6:12 PM

## 2012-08-04 ENCOUNTER — Encounter (HOSPITAL_COMMUNITY): Payer: Self-pay

## 2012-08-04 DIAGNOSIS — J471 Bronchiectasis with (acute) exacerbation: Secondary | ICD-10-CM

## 2012-08-04 DIAGNOSIS — M79605 Pain in left leg: Secondary | ICD-10-CM | POA: Diagnosis present

## 2012-08-04 DIAGNOSIS — R197 Diarrhea, unspecified: Secondary | ICD-10-CM | POA: Diagnosis not present

## 2012-08-04 DIAGNOSIS — E119 Type 2 diabetes mellitus without complications: Secondary | ICD-10-CM | POA: Diagnosis not present

## 2012-08-04 DIAGNOSIS — R5381 Other malaise: Secondary | ICD-10-CM | POA: Diagnosis not present

## 2012-08-04 LAB — BASIC METABOLIC PANEL
BUN: 7 mg/dL (ref 6–23)
Creatinine, Ser: 0.53 mg/dL (ref 0.50–1.10)
GFR calc Af Amer: >90 mL/min (ref >90)
GFR calc non Af Amer: 85 mL/min — ABNORMAL LOW (ref >90)
Potassium: 3.4 mEq/L — ABNORMAL LOW (ref 3.5–5.1)

## 2012-08-04 LAB — CBC
HCT: 33.1 % — ABNORMAL LOW (ref 36.0–46.0)
MCHC: 35 g/dL (ref 30.0–36.0)
Platelets: 153 10*3/uL (ref 150–400)
RDW: 13.2 % (ref 11.5–15.5)

## 2012-08-04 LAB — GLUCOSE, CAPILLARY: Glucose-Capillary: 94 mg/dL (ref 70–99)

## 2012-08-04 LAB — CLOSTRIDIUM DIFFICILE BY PCR: Toxigenic C. Difficile by PCR: NEGATIVE

## 2012-08-04 MED ORDER — TRAMADOL HCL 50 MG PO TABS
50.0000 mg | ORAL_TABLET | Freq: Four times a day (QID) | ORAL | Status: DC | PRN
  Administered 2012-08-04: 50 mg via ORAL
  Filled 2012-08-04: qty 1

## 2012-08-04 MED ORDER — ENOXAPARIN SODIUM 40 MG/0.4ML ~~LOC~~ SOLN
40.0000 mg | SUBCUTANEOUS | Status: DC
  Administered 2012-08-04: 40 mg via SUBCUTANEOUS
  Filled 2012-08-04 (×2): qty 0.4

## 2012-08-04 MED ORDER — HYDROCOD POLST-CHLORPHEN POLST 10-8 MG/5ML PO LQCR
5.0000 mL | Freq: Four times a day (QID) | ORAL | Status: DC | PRN
  Administered 2012-08-04 (×2): 2.5 mL via ORAL
  Filled 2012-08-04 (×2): qty 5

## 2012-08-04 MED ORDER — LOPERAMIDE HCL 2 MG PO CAPS: 2.0000 mg | ORAL_CAPSULE | ORAL | Status: DC | PRN

## 2012-08-04 MED ORDER — ACETAMINOPHEN 325 MG PO TABS
650.0000 mg | ORAL_TABLET | ORAL | Status: DC | PRN
  Administered 2012-08-04: 650 mg via ORAL
  Filled 2012-08-04: qty 2

## 2012-08-04 MED ORDER — ADULT MULTIVITAMIN W/MINERALS CH
1.0000 | ORAL_TABLET | Freq: Every day | ORAL | Status: DC
  Administered 2012-08-04 – 2012-08-05 (×2): 1 via ORAL
  Filled 2012-08-04 (×2): qty 1

## 2012-08-04 MED ORDER — HYDROCOD POLST-CHLORPHEN POLST 10-8 MG/5ML PO LQCR
5.0000 mL | Freq: Once | ORAL | Status: AC
  Administered 2012-08-04: 5 mL via ORAL
  Filled 2012-08-04: qty 5

## 2012-08-04 MED ORDER — IBUPROFEN 400 MG PO TABS
400.0000 mg | ORAL_TABLET | Freq: Once | ORAL | Status: DC
  Filled 2012-08-04: qty 1

## 2012-08-04 MED ORDER — MAGNESIUM SULFATE 50 % IJ SOLN
3.0000 g | Freq: Once | INTRAVENOUS | Status: AC
  Administered 2012-08-04: 3 g via INTRAVENOUS
  Filled 2012-08-04: qty 6

## 2012-08-04 MED ORDER — OXYCODONE HCL 5 MG PO TABS
5.0000 mg | ORAL_TABLET | ORAL | Status: DC | PRN
  Administered 2012-08-04 (×2): 5 mg via ORAL
  Filled 2012-08-04 (×2): qty 1

## 2012-08-04 MED ORDER — POTASSIUM CHLORIDE CRYS ER 20 MEQ PO TBCR
40.0000 meq | EXTENDED_RELEASE_TABLET | Freq: Once | ORAL | Status: AC
  Administered 2012-08-04: 40 meq via ORAL
  Filled 2012-08-04: qty 2

## 2012-08-04 MED ORDER — TRAZODONE 25 MG HALF TABLET
25.0000 mg | ORAL_TABLET | Freq: Every evening | ORAL | Status: DC | PRN
  Administered 2012-08-04: 25 mg via ORAL
  Filled 2012-08-04 (×2): qty 1

## 2012-08-04 NOTE — Progress Notes (Signed)
INITIAL NUTRITION ASSESSMENT  DOCUMENTATION CODES Per approved criteria  -Not Applicable   INTERVENTION: - Diet advancement per MD - Multivitamin 1 tablet PO daily - Glucerna shake BID when diet advancement - Recommend MD consider appetite stimulant as pt with almost 1 month of poor appetite - Will continue to monitor   NUTRITION DIAGNOSIS: Inadequate oral intake related to clear liquid diet as evidenced by diet order.   Goal: 1. No further diarrhea 2. Advance diet as tolerated to diabetic diet   Monitor:  Weights, labs, diet advancement, diarrhea  Reason for Assessment: Nutrition risk   79 y.o. female  Admitting Dx: Diarrhea  ASSESSMENT: Pt known to RD from admission last week. 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 Pt reports diarrhea started the night before admission and she has had 10-15 episodes since then.   Pt reports poor appetite for weeks, consuming only small amounts of food. Weight stable since last week. Pt reports she is on a medication that causes anorexia and weight loss, unsure of the name.     Height: Ht Readings from Last 1 Encounters:  08/03/12 5\' 8"  (1.727 m)    Weight: Wt Readings from Last 1 Encounters:  08/03/12 152 lb 8.9 oz (69.2 kg)    Ideal Body Weight: 140 lb  % Ideal Body Weight: 108  Wt Readings from Last 10 Encounters:  08/03/12 152 lb 8.9 oz (69.2 kg)  07/29/12 152 lb 3.2 oz (69.037 kg)  07/14/12 158 lb 12.8 oz (72.031 kg)  06/09/12 156 lb 9.6 oz (71.033 kg)  05/28/12 158 lb 3.2 oz (71.759 kg)  01/16/12 157 lb 12.8 oz (71.578 kg)  11/14/11 158 lb 12.8 oz (72.031 kg)  09/30/11 163 lb 6.4 oz (74.118 kg)  04/11/11 167 lb 8 oz (75.978 kg)  04/09/11 163 lb 9.6 oz (74.208 kg)    Usual Body Weight: 172 lb 2 months ago per pt  % Usual Body Weight: 88            BMI:  Body mass index is  23.2 kg/(m^2).  Estimated Nutritional Needs: Kcal: 1725-2000 Protein: 70-85g Fluid: 1.7-2L/day  Skin: Intact   Diet Order: Clear Liquid  EDUCATION NEEDS: -No education needs identified at this time   Intake/Output Summary (Last 24 hours) at 08/04/12 1528 Last data filed at 08/04/12 1400  Gross per 24 hour  Intake   1616 ml  Output      0 ml  Net   1616 ml    Last BM: 4/14  Labs:   Recent Labs Lab 07/31/12 0346 08/03/12 1220 08/04/12 0550 08/04/12 0555  NA 133* 132* 138  --   K 3.4* 3.9 3.4*  --   CL 96 93* 102  --   CO2 29 28 25   --   BUN 10 13 7   --   CREATININE 0.70 0.66 0.53  --   CALCIUM 8.5 9.5 8.0*  --   MG  --   --   --  1.4*  GLUCOSE 110* 107* 90  --     CBG (last 3)   Recent Labs  08/03/12 1637 08/03/12 2127 08/04/12 0759  GLUCAP 109* 104* 103*    Scheduled Meds: . allopurinol  100 mg Oral q morning - 10a  . aspirin EC  81 mg Oral q morning - 10a  . budesonide  0.25 mg Nebulization Daily  . clarithromycin  500 mg Oral 3 times weekly  . enoxaparin (LOVENOX) injection  40 mg Subcutaneous Q24H  . ethambutol  1,200 mg Oral 3 times weekly  . ibuprofen  400 mg Oral Once  . insulin aspart  0-9 Units Subcutaneous TID WC  . losartan  12.5 mg Oral q morning - 10a  . pantoprazole  40 mg Oral Daily  . [START ON 08/05/2012] rifabutin  300 mg Oral 3 times weekly  . sodium chloride  1,000 mL Intravenous Once    Continuous Infusions: . sodium chloride      Past Medical History  Diagnosis Date  . Diabetes mellitus   . Other symptoms involving nervous and musculoskeletal systems   . Insomnia, unspecified   . Cough   . Bronchiectasis with acute exacerbation   . Acute nasopharyngitis (common cold)   . Esophageal reflux     Past Surgical History  Procedure Laterality Date  . Foot    . Abdominal hysterectomy    . Colon surgery    . Appendectomy       Levon Hedger MS, RD, LDN [phone] Pager [phone] After Hours Pager

## 2012-08-04 NOTE — Progress Notes (Signed)
[facility] PROGRESS NOTE  Katie Alvarado EAV:[id number] DOB: 02-Apr-1926 DOA: 08/03/2012 PCP: Mickie Hillier, MD  Assessment/Plan: #1 diarrhea Likely viral etiology. C. difficile PCR is negative. Clinical improvement. Supportive care.  #2 bronchiectasis with MAC Continue Biaxin, ethambutol, reviewed and started on 3/26 per Dr. Maple Hudson of pulmonary. Followup as outpatient.  #3 diabetes mellitus Hemoglobin A1c is 6.6. Sliding scale insulin.  #4 recently diagnosed Klebsiella UTI Status post 6 days of antibiotics.  #5 hypertension Continue current regimen.  #6 generalized weakness Likely secondary to debility and problems #1 and 2. PT OT.  #7 prophylaxis Lovenox for DVT prophylaxis.  Code Status: Full Family Communication: Updated patient and daughter bedside Disposition Plan: Home when medically stable in 1-2 days   Consultants:  None  Procedures:  None  Antibiotics:  Biaxin started prior to admission  Ethanbutol started prior to admission  Rifabutin started prior to admission   HPI/Subjective: Patient states diarrhea has improved. Patient complaining of bilateral lower extremity pain. Patient denies any recent falls.  Objective: Filed Vitals:   08/03/12 2130 08/03/12 2313 08/04/12 0623 08/04/12 1102  BP: 183/80 167/80 138/72   Pulse: 68  69   Temp: 98 F (36.7 C)     TempSrc: Oral  Oral   Resp: 20  20   Height:      Weight:      SpO2: 95%  94% 94%    Intake/Output Summary (Last 24 hours) at 08/04/12 1156 Last data filed at 08/03/12 1427  Gross per 24 hour  Intake   1000 ml  Output      0 ml  Net   1000 ml   Filed Weights   08/03/12 1839  Weight: 69.2 kg (152 lb 8.9 oz)    Exam:   General:  NAD  Cardiovascular: RRR  Respiratory: CTAB  Abdomen: Soft, nontender, nondistended, positive bowel sounds  Extremities: No clubbing cyanosis or edema.  Data Reviewed: Basic Metabolic Panel:  Recent Labs Lab 07/29/12 1420  07/30/12 0401 07/31/12 0346 08/03/12 1220 08/04/12 0550 08/04/12 0555  NA 132* 133* 133* 132* 138  --   K 4.1 3.5 3.4* 3.9 3.4*  --   CL 92* 96 96 93* 102  --   CO2 30 28 29 28 25   --   GLUCOSE 116* 90 110* 107* 90  --   BUN 12 11 10 13 7   --   CREATININE 0.66 0.75 0.70 0.66 0.53  --   CALCIUM 9.7 8.8 8.5 9.5 8.0*  --   MG  --   --   --   --   --  1.4*   Liver Function Tests:  Recent Labs Lab 07/29/12 1420 07/30/12 0401 08/03/12 1220  AST 22 18 43*  ALT 12 11 36*  ALKPHOS 64 48 60  BILITOT 0.2* 0.2* 0.3  PROT 7.0 5.9* 7.2  ALBUMIN 3.7 3.1* 3.8   No results found for this basename: LIPASE, AMYLASE,  in the last 168 hours No results found for this basename: AMMONIA,  in the last 168 hours CBC:  Recent Labs Lab 07/29/12 1420 07/30/12 0401 07/31/12 0346 08/03/12 1220 08/04/12 0550  WBC 4.0 3.4* 3.3* 4.9 3.3*  NEUTROABS 2.0  --   --  2.7  --   HGB 12.6 11.6* 11.8* 13.7 11.6*  HCT 37.3 34.5* 35.5* 39.9 33.1*  MCV 85.4 85.6 85.1 84.9 84.9  PLT 231 186 172 211 153   Cardiac Enzymes: No results found for this basename: CKTOTAL, CKMB, CKMBINDEX, TROPONINI,  in the last 168 hours BNP (last 3 results) No results found for this basename: PROBNP,  in the last 8760 hours CBG:  Recent Labs Lab 07/31/12 2119 08/01/12 0712 08/03/12 1637 08/03/12 2127 08/04/12 0759  GLUCAP 83 138* 109* 104* 103*    Recent Results (from the past 240 hour(s))  URINE CULTURE     Status: None   Collection Time    07/29/12  2:48 PM      Result Value Range Status   Specimen Description URINE, CLEAN CATCH   Final   Special Requests NONE   Final   Culture  Setup Time 07/29/2012 22:56   Final   Colony Count >=100,000 COLONIES/ML   Final   Culture KLEBSIELLA PNEUMONIAE   Final   Report Status 07/31/2012 FINAL   Final   Organism ID, Bacteria KLEBSIELLA PNEUMONIAE   Final  CLOSTRIDIUM DIFFICILE BY PCR     Status: None   Collection Time    08/03/12 10:24 PM      Result Value Range  Status   C difficile by pcr NEGATIVE  NEGATIVE Final     Studies: No results found.  Scheduled Meds: . allopurinol  100 mg Oral q morning - 10a  . aspirin EC  81 mg Oral q morning - 10a  . budesonide  0.25 mg Nebulization Daily  . clarithromycin  500 mg Oral 3 times weekly  . enoxaparin (LOVENOX) injection  30 mg Subcutaneous Q24H  . ethambutol  1,200 mg Oral 3 times weekly  . ibuprofen  400 mg Oral Once  . insulin aspart  0-9 Units Subcutaneous TID WC  . losartan  12.5 mg Oral q morning - 10a  . magnesium sulfate 1 - 4 g bolus IVPB  3 g Intravenous Once  . pantoprazole  40 mg Oral Daily  . [START ON 08/05/2012] rifabutin  300 mg Oral 3 times weekly  . sodium chloride  1,000 mL Intravenous Once   Continuous Infusions: . sodium chloride      Principal Problem:   Diarrhea Active Problems:   BRONCHIECTASIS, + MAIC   Esophageal reflux   DM type 2 (diabetes mellitus, type 2)   Generalized weakness   Klebsiella infection   Lower extremity pain, bilateral    Time spent: . 35 mins    [facility]  [facility] Pager [phone]. If 7PM-7AM, please contact night-coverage at [email or web address], password [facility] 08/04/2012, 11:56 AM  LOS: 1 day

## 2012-08-04 NOTE — Progress Notes (Signed)
Pt has had c/o pain in her ankles and the pain is now moving up both of her legs.  Dr Janee Morn informed and new orders noted.  Pt also c/o cough new orders noted.

## 2012-08-05 DIAGNOSIS — R197 Diarrhea, unspecified: Secondary | ICD-10-CM | POA: Diagnosis not present

## 2012-08-05 DIAGNOSIS — R5381 Other malaise: Secondary | ICD-10-CM | POA: Diagnosis not present

## 2012-08-05 DIAGNOSIS — M79609 Pain in unspecified limb: Secondary | ICD-10-CM | POA: Diagnosis not present

## 2012-08-05 DIAGNOSIS — J479 Bronchiectasis, uncomplicated: Secondary | ICD-10-CM | POA: Diagnosis not present

## 2012-08-05 LAB — BASIC METABOLIC PANEL
BUN: 5 mg/dL — ABNORMAL LOW (ref 6–23)
Calcium: 7.8 mg/dL — ABNORMAL LOW (ref 8.4–10.5)
Creatinine, Ser: 0.65 mg/dL (ref 0.50–1.10)
GFR calc non Af Amer: 79 mL/min — ABNORMAL LOW (ref >90)
Glucose, Bld: 91 mg/dL (ref 70–99)

## 2012-08-05 LAB — CBC
HCT: 31 % — ABNORMAL LOW (ref 36.0–46.0)
Hemoglobin: 10.6 g/dL — ABNORMAL LOW (ref 12.0–15.0)
MCH: 29.1 pg (ref 26.0–34.0)
MCHC: 34.2 g/dL (ref 30.0–36.0)
RDW: 13.2 % (ref 11.5–15.5)

## 2012-08-05 MED ORDER — MEGESTROL ACETATE 400 MG/10ML PO SUSP
400.0000 mg | Freq: Two times a day (BID) | ORAL | Status: DC
  Administered 2012-08-05: 400 mg via ORAL
  Filled 2012-08-05 (×2): qty 10

## 2012-08-05 MED ORDER — OXYCODONE HCL 5 MG PO TABS: 5.0000 mg | ORAL_TABLET | ORAL | Status: DC | PRN

## 2012-08-05 MED ORDER — HYDROCOD POLST-CHLORPHEN POLST 10-8 MG/5ML PO LQCR: 5.0000 mL | Freq: Four times a day (QID) | ORAL | Status: DC | PRN

## 2012-08-05 MED ORDER — TRAZODONE 25 MG HALF TABLET: 25.0000 mg | ORAL_TABLET | Freq: Every evening | ORAL | Status: DC | PRN

## 2012-08-05 MED ORDER — LOPERAMIDE HCL 2 MG PO CAPS: 2.0000 mg | ORAL_CAPSULE | ORAL | Status: DC | PRN

## 2012-08-05 MED ORDER — ACETAMINOPHEN 325 MG PO TABS: 650.0000 mg | ORAL_TABLET | Freq: Four times a day (QID) | ORAL | Status: DC | PRN

## 2012-08-05 MED ORDER — GLUCERNA SHAKE PO LIQD: 237.0000 mL | Freq: Two times a day (BID) | ORAL | Status: DC

## 2012-08-05 MED ORDER — GLUCERNA SHAKE PO LIQD
237.0000 mL | Freq: Two times a day (BID) | ORAL | Status: DC
  Administered 2012-08-05: 237 mL via ORAL
  Filled 2012-08-05 (×2): qty 237

## 2012-08-05 NOTE — Evaluation (Addendum)
Occupational Therapy One Time Evaluation Patient Details Name: Katie Alvarado MRN: [id number] DOB: [birth year] Today's Date: 08/05/2012 Time: 4098-1191 OT Time Calculation (min): 10 min  OT Assessment / Plan / Recommendation Clinical Impression  Pt is adimitted with weakness and diarrhea. She is doing well and performing ADL at supervision level. Has daughter in for a week to assist PRN. NO further OT needs.    OT Assessment  Patient does not need any further OT services    Follow Up Recommendations  No OT follow up    Barriers to Discharge      Equipment Recommendations  None recommended by OT    Recommendations for Other Services    Frequency       Precautions / Restrictions Precautions Precautions: None        ADL  Eating/Feeding: Simulated;Independent Where Assessed - Eating/Feeding: Chair Grooming: Simulated;Wash/dry hands;Supervision/safety Where Assessed - Grooming: Unsupported standing Upper Body Bathing: Simulated;Chest;Right arm;Left arm;Abdomen;Set up (own setup) Where Assessed - Upper Body Bathing: Unsupported sitting Lower Body Bathing: Simulated;Supervision/safety Where Assessed - Lower Body Bathing: Supported sit to stand Upper Body Dressing: Simulated;Set up Where Assessed - Upper Body Dressing: Unsupported sitting Lower Body Dressing: Simulated;Supervision/safety Where Assessed - Lower Body Dressing: Supported sit to stand Toilet Transfer: Research scientist (life sciences) Method: Other (comment) (into bathroom no device) Acupuncturist: Comfort height toilet;Grab bars Toileting - Clothing Manipulation and Hygiene: Supervision/safety Where Assessed - Engineer, mining and Hygiene: Sit to stand from 3-in-1 or toilet Tub/Shower Transfer: Performed;Supervision/safety ADL Comments: Pt has a built in shower seat and states she sits down to wash feet. She did well with no holding to grab bar as she doesnt have one at home.  Pt has a high toilet at home and did well with comfort height . She would like a grab bar at home but states there isnt room on the wall. Discussed toilet rails that screw into the back of the toilet. Pt states she will check into this with DME places.     OT Diagnosis:    OT Problem List:   OT Treatment Interventions:     OT Goals    Visit Information  Last OT Received On: 08/05/12 Assistance Needed: +1    Subjective Data  Subjective: I think I am doing pretty good Patient Stated Goal: home when ready   Prior Functioning     Home Living Lives With: Alone Available Help at Discharge: Family;Available 24 hours/day (daughter visiting for a week from [facility]) Type of Home: House Home Access: Ramped entrance Bathroom Shower/Tub: Walk-in shower Bathroom Toilet: Handicapped height Home Adaptive Equipment: Bedside commode/3-in-1;Straight cane;Walker - rolling;Walker - four wheeled;Built-in shower seat Additional Comments: friend gets groceries Prior Function Level of Independence: Independent;Independent with assistive device(s) Communication Communication: No difficulties         Vision/Perception     Cognition  Cognition Arousal/Alertness: Awake/alert Behavior During Therapy: WFL for tasks assessed/performed Overall Cognitive Status: Within Functional Limits for tasks assessed    Extremity/Trunk Assessment Right Upper Extremity Assessment RUE ROM/Strength/Tone: [facility] for tasks assessed Left Upper Extremity Assessment LUE ROM/Strength/Tone: WFL for tasks assessed     Mobility Transfers Transfers: Sit to Stand;Stand to Sit Sit to Stand: 5: Supervision;With upper extremity assist;From toilet;From chair/3-in-1 Stand to Sit: 5: Supervision;With upper extremity assist;To toilet;To chair/3-in-1     Exercise     Balance Balance Balance Assessed: Yes Static Standing Balance Static Standing - Level of Assistance: 5: Stand by assistance   End of Session OT -  End of  Session Activity Tolerance: Patient tolerated treatment well Patient left: in chair;with call bell/phone within reach  GO     Lennox Laity [phone] 08/05/2012, 9:51 AM

## 2012-08-05 NOTE — Evaluation (Signed)
Physical Therapy One Time Evaluation Patient Details Name: SHANIQUA GUILLOT MRN: [id number] DOB: November 01, 1925 Today's Date: 08/05/2012 Time: 7829-5621 PT Time Calculation (min): 11 min  PT Assessment / Plan / Recommendation Clinical Impression  Pt is an 79 year old female admitted for weakness and diarrhea.  Pt reports no falls at home and her daughter will be staying with her for a week (from [facility]).  Pt reports she feels back to baseline and presents with no PT needs at this time.    PT Assessment  Patent does not need any further PT services    Follow Up Recommendations  No PT follow up    Does the patient have the potential to tolerate intense rehabilitation      Barriers to Discharge        Equipment Recommendations  None recommended by PT    Recommendations for Other Services     Frequency      Precautions / Restrictions Precautions Precautions: None   Pertinent Vitals/Pain Denies dizziness, no c/o pain      Mobility  Bed Mobility Bed Mobility: Supine to Sit Supine to Sit: 6: Modified independent (Device/Increase time);HOB elevated Transfers Transfers: Sit to Stand;Stand to Sit Sit to Stand: 5: Supervision;With upper extremity assist;From chair/3-in-1 Stand to Sit: 5: Supervision;With upper extremity assist;To chair/3-in-1 Ambulation/Gait Ambulation/Gait Assistance: 5: Supervision Ambulation Distance (Feet): 200 Feet Ambulation/Gait Assistance Details: pt used IV pole for some support, states she usually uses a SPC, no LOB or unsteady gait observed Gait Pattern: Within Functional Limits    Exercises     PT Diagnosis:    PT Problem List:   PT Treatment Interventions:     PT Goals    Visit Information  Last PT Received On: 08/05/12 Assistance Needed: +1    Subjective Data  Subjective: I've been sitting up in bed this morning.   Prior Functioning  Home Living Lives With: Alone Available Help at Discharge: Family;Available 24 hours/day (daughter visiting  for a week from [facility]) Type of Home: House Home Access: Ramped entrance Bathroom Shower/Tub: Walk-in shower Bathroom Toilet: Handicapped height Home Adaptive Equipment: Bedside commode/3-in-1;Straight cane;Walker - rolling;Walker - four wheeled;Built-in shower seat Additional Comments: friend gets groceries Prior Function Level of Independence: Independent;Independent with assistive device(s) Communication Communication: No difficulties    Cognition  Cognition Arousal/Alertness: Awake/alert Behavior During Therapy: WFL for tasks assessed/performed Overall Cognitive Status: Within Functional Limits for tasks assessed    Extremity/Trunk Assessment Right Upper Extremity Assessment RUE ROM/Strength/Tone: [facility] for tasks assessed Left Upper Extremity Assessment LUE ROM/Strength/Tone: WFL for tasks assessed Right Lower Extremity Assessment RLE ROM/Strength/Tone: [facility] for tasks assessed Left Lower Extremity Assessment LLE ROM/Strength/Tone: WFL for tasks assessed Trunk Assessment Trunk Assessment: Normal   Balance Balance Balance Assessed: Yes Static Standing Balance Static Standing - Level of Assistance: 5: Stand by assistance  End of Session PT - End of Session Activity Tolerance: Patient tolerated treatment well Patient left: in chair;Other (comment) (with OT entering room)  GP     Kennedi Lizardo,KATHrine E 08/05/2012, 11:19 AM Zenovia Jarred, PT, DPT 08/05/2012 Pager: [phone]

## 2012-08-05 NOTE — Discharge Summary (Signed)
Physician Discharge Summary  Katie Alvarado ZOX:[id number] DOB: [birth year] DOA: 08/03/2012  PCP: Mickie Hillier, MD  Admit date: 08/03/2012 Discharge date: 08/05/2012  Time spent: 65 minutes  Recommendations for Outpatient Follow-up:  1. Followup with PCP one week post discharge. On followup basic metabolic profile needs to be obtained to followup on patient's electrolytes and renal function. Patient's nutritional status will need to be also reassessed at that time to see patient may need an appetite stimulant. 2. Patient is to followup with Dr. Maple Hudson as scheduled.  Discharge Diagnoses:  Principal Problem:   Diarrhea Active Problems:   BRONCHIECTASIS, + MAIC   Esophageal reflux   DM type 2 (diabetes mellitus, type 2)   Generalized weakness   Klebsiella infection   Lower extremity pain, bilateral   Discharge Condition: Stable  Diet recommendation: Carb modified  Filed Weights   08/03/12 1839  Weight: 69.2 kg (152 lb 8.9 oz)    History of present illness:  Katie Alvarado is a 79 y.o. 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 Maple Hudson since 3/26.  She was just discharged from [facility] 4/12 after evaluation and treatment of UTI and generalized weakness, on PO VAntin to complete a 7 day course.  She reports acute onset of severe watery diarrhea last night, non bloody or bilious, about 10-15 episodes since then.  She denies any vomiting, abdominal pain, fevers or chills.  She also reports anorexia, poor PO and worsening fatigue   Hospital Course:  #1 diarrhea  Patient was admitted with diarrhea. Due to patient's use of antibiotics there was some concern for C. difficile. C. difficile PCR was obtained which came back negative. Patient's diarrhea improved. Patient was maintained on supportive care during the hospitalization. Patient be discharged home in stable and improved condition. Patient be  discharged on Imodium as needed.  #2 bronchiectasis with MAC  Patient had been started on Biaxin, ethambutol, rifabutin on 3/26 per Dr. Maple Hudson of pulmonary. Patient was maintained on this regimen during the hospitalization. Followup as outpatient.  #3 diabetes mellitus  Hemoglobin A1c is 6.6. Patient was maintained on sliding scale insulin.  #4 recently diagnosed Klebsiella UTI  Status post 6 days of antibiotics. Antibiotics were discontinued.  The rest of patient's chronic medical issues remained stable throughout the hospitalization and patient be discharged in stable and improved condition.    Procedures:  None  Consultations:  None  Discharge Exam: Filed Vitals:   08/04/12 2200 08/05/12 0500 08/05/12 1008 08/05/12 1258  BP: 138/76 134/62  129/57  Pulse: 73 69  78  Temp: 98.1 F (36.7 C) 98.4 F (36.9 C)  98.1 F (36.7 C)  TempSrc: Oral Oral  Oral  Resp: 18 18  18   Height:      Weight:      SpO2: 94% 96% 95% 97%    General: NAD Cardiovascular: RRR Respiratory: CTAB  Discharge Instructions      Discharge Orders   Future Appointments Provider Department Dept Phone   08/31/2012 2:45 PM Waymon Budge, MD [facility] [phone]   09/25/2012 1:30 PM Waymon Budge, MD [facility] [phone]   Future Orders Complete By Expires     Diet Carb Modified  As directed     Discharge instructions  As directed     Comments:      Follow up with Mickie Hillier, MD in 1 week Follow up with Dr Maple Hudson as scheduled.  Increase activity slowly  As directed     Increase activity slowly  As directed         Medication List    STOP taking these medications       cefpodoxime 200 MG tablet  Commonly known as:  VANTIN     meclizine 25 MG tablet  Commonly known as:  ANTIVERT      TAKE these medications       acetaminophen 325 MG tablet  Commonly known as:  TYLENOL  Take 2 tablets (650 mg total) by mouth every 6 (six) hours as needed for pain.      albuterol 108 (90 BASE) MCG/ACT inhaler  Commonly known as:  PROVENTIL HFA;VENTOLIN HFA  Inhale 2 puffs into the lungs every 6 (six) hours as needed for wheezing or shortness of breath.     allopurinol 100 MG tablet  Commonly known as:  ZYLOPRIM  Take 100 mg by mouth every morning.     ALPRAZolam 0.25 MG tablet  Commonly known as:  XANAX  Take 0.25 mg by mouth 3 (three) times daily as needed. For anxiety.     aspirin EC 81 MG tablet  Take 81 mg by mouth every morning.     budesonide 0.25 MG/2ML nebulizer solution  Commonly known as:  PULMICORT  Take 2 mLs (0.25 mg total) by nebulization daily.     chlorpheniramine-HYDROcodone 10-8 MG/5ML Lqcr  Commonly known as:  TUSSIONEX  Take 5 mLs by mouth every 6 (six) hours as needed.     cholecalciferol 1000 UNITS tablet  Commonly known as:  VITAMIN D  Take 2,000 Units by mouth every morning.     Cinnamon 500 MG capsule  Take 500 mg by mouth every morning.     clarithromycin 500 MG tablet  Commonly known as:  BIAXIN  Take 500 mg by mouth 3 (three) times a week. Monday, Wednesday, and Friday.     esomeprazole 40 MG capsule  Commonly known as:  NEXIUM  Take 40 mg by mouth every morning.     ethambutol 400 MG tablet  Commonly known as:  MYAMBUTOL  Take 1,200 mg by mouth 3 (three) times a week. Monday, Weds, Friday     feeding supplement Liqd  Take 237 mLs by mouth 2 (two) times daily between meals.     Fish Oil 1000 MG Caps  Take 1 capsule by mouth daily.     ipratropium 0.02 % nebulizer solution  Commonly known as:  ATROVENT  Take 500 mcg by nebulization 4 (four) times daily as needed.     loperamide 2 MG capsule  Commonly known as:  IMODIUM  Take 1 capsule (2 mg total) by mouth as needed for diarrhea or loose stools.     losartan 25 MG tablet  Commonly known as:  COZAAR  Take 12.5 mg by mouth every morning.     metFORMIN 500 MG tablet  Commonly known as:  GLUCOPHAGE  Take 750 mg by mouth 2 (two) times daily.      ondansetron 8 MG disintegrating tablet  Commonly known as:  ZOFRAN ODT  Take 1 tablet (8 mg total) by mouth every 8 (eight) hours as needed for nausea.     oxyCODONE 5 MG immediate release tablet  Commonly known as:  Oxy IR/ROXICODONE  Take 1 tablet (5 mg total) by mouth every 4 (four) hours as needed.     rifabutin 150 MG capsule  Commonly known as:  MYCOBUTIN  Take 300 mg  by mouth 3 (three) times a week. 2 capsules ( 300 mg total) every Monday, Weds, Friday     traZODone 25 mg Tabs  Commonly known as:  DESYREL  Take 0.5 tablets (25 mg total) by mouth at bedtime as needed.     triamterene-hydrochlorothiazide 75-50 MG per tablet  Commonly known as:  MAXZIDE  Take 0.5 tablets by mouth every morning.     vitamin E 400 UNIT capsule  Take 400 Units by mouth daily.       Follow-up Information   Follow up with Mickie Hillier, MD. Schedule an appointment as soon as possible for a visit in 1 week.   Contact information:   [address] [place] [address] [phone]       Follow up with Waymon Budge, MD. (f/u as scheduled)    Contact information:   [address]  [facility] [place] [address] [phone]        The results of significant diagnostics from this hospitalization (including imaging, microbiology, ancillary and laboratory) are listed below for reference.    Significant Diagnostic Studies: Dg Chest 2 View  07/29/2012  *RADIOLOGY REPORT*  Clinical Data: Productive cough, diabetic  CHEST - 2 VIEW  Comparison: Prior chest x-ray 07/27/2012  Findings: Stable appearance of patchy opacities and varicose bronchiectasis in the lingula, and dependent upper and lower lobes bilaterally.  No significant interval change compared to the recent prior chest x-ray.  Stable blunting of the left costophrenic angle. Cardiac and mediastinal contours remain within normal limits. There is atherosclerotic calcification of the transverse aorta.  No new  large pleural effusion, pneumothorax or definite airspace consolidation.  No acute osseous abnormality.  IMPRESSION:  1.  No acute cardiopulmonary process 2. No significant interval change in the appearance of known varicose bronchiectasis and scarring within the lingula and the dependent aspects of the upper and lower lobes bilaterally.   Original Report Authenticated By: Malachy Moan, M.D.    Dg Chest 2 View  07/27/2012  *RADIOLOGY REPORT*  Clinical Data: Fatigue, weakness for 3 days  CHEST - 2 VIEW  Comparison: CT chest of 06/01/2012 and chest x-ray of 05/28/2012  Findings: Nodularity in both mid lungs and the left lung base is again noted.  CT has demonstrate that these nodules represent bronchiectasis, and scarring.  No new parenchymal process is seen and no pleural effusion is noted.  Heart size is stable.  No mediastinal or hilar adenopathy is noted.  No bony abnormality is seen.  IMPRESSION: Stable changes of bronchiectasis and scarring.  No definite active process.   Original Report Authenticated By: Dwyane Dee, M.D.     Microbiology: Recent Results (from the past 240 hour(s))  URINE CULTURE     Status: None   Collection Time    07/29/12  2:48 PM      Result Value Range Status   Specimen Description URINE, CLEAN CATCH   Final   Special Requests NONE   Final   Culture  Setup Time 07/29/2012 22:56   Final   Colony Count >=100,000 COLONIES/ML   Final   Culture KLEBSIELLA PNEUMONIAE   Final   Report Status 07/31/2012 FINAL   Final   Organism ID, Bacteria KLEBSIELLA PNEUMONIAE   Final  CLOSTRIDIUM DIFFICILE BY PCR     Status: None   Collection Time    08/03/12 10:24 PM      Result Value Range Status   C difficile by pcr NEGATIVE  NEGATIVE Final  Labs: Basic Metabolic Panel:  Recent Labs Lab 07/30/12 0401 07/31/12 0346 08/03/12 1220 08/04/12 0550 08/04/12 0555 08/05/12 0510  NA 133* 133* 132* 138  --  134*  K 3.5 3.4* 3.9 3.4*  --  3.9  CL 96 96 93* 102  --  104  CO2  28 29 28 25   --  25  GLUCOSE 90 110* 107* 90  --  91  BUN 11 10 13 7   --  5*  CREATININE 0.75 0.70 0.66 0.53  --  0.65  CALCIUM 8.8 8.5 9.5 8.0*  --  7.8*  MG  --   --   --   --  1.4* 2.1   Liver Function Tests:  Recent Labs Lab 07/30/12 0401 08/03/12 1220  AST 18 43*  ALT 11 36*  ALKPHOS 48 60  BILITOT 0.2* 0.3  PROT 5.9* 7.2  ALBUMIN 3.1* 3.8   No results found for this basename: LIPASE, AMYLASE,  in the last 168 hours No results found for this basename: AMMONIA,  in the last 168 hours CBC:  Recent Labs Lab 07/30/12 0401 07/31/12 0346 08/03/12 1220 08/04/12 0550 08/05/12 0510  WBC 3.4* 3.3* 4.9 3.3* 2.7*  NEUTROABS  --   --  2.7  --   --   HGB 11.6* 11.8* 13.7 11.6* 10.6*  HCT 34.5* 35.5* 39.9 33.1* 31.0*  MCV 85.6 85.1 84.9 84.9 85.2  PLT 186 172 211 153 140*   Cardiac Enzymes: No results found for this basename: CKTOTAL, CKMB, CKMBINDEX, TROPONINI,  in the last 168 hours BNP: BNP (last 3 results) No results found for this basename: PROBNP,  in the last 8760 hours CBG:  Recent Labs Lab 08/04/12 1154 08/04/12 1655 08/04/12 2245 08/05/12 0720 08/05/12 1115  GLUCAP 158* 90 94 94 113*       Signed:  Shaterrica Territo  [facility] 08/05/2012, 2:27 PM   A

## 2012-08-05 NOTE — Progress Notes (Signed)
Patient discharged home in stable condition.  Instructions and scripts given to patient and daughter with verbal feedback and understanding.  No further questions at this time.

## 2012-08-11 ENCOUNTER — Telehealth: Payer: Self-pay | Admitting: Internal Medicine

## 2012-08-11 DIAGNOSIS — N390 Urinary tract infection, site not specified: Secondary | ICD-10-CM | POA: Diagnosis not present

## 2012-08-11 DIAGNOSIS — R5381 Other malaise: Secondary | ICD-10-CM | POA: Diagnosis not present

## 2012-08-11 NOTE — Telephone Encounter (Signed)
Left me a message on desk phone that she wasn't feeling well and asking I call. I reached her voice mail. LMOM

## 2012-08-12 ENCOUNTER — Telehealth: Payer: Self-pay | Admitting: Internal Medicine

## 2012-08-12 NOTE — Telephone Encounter (Signed)
Patient called me saying she went to Dr. Fredirick Maudlin yesterday, labs were ok and he asked her to call me.  Daughter is with her, but going back. I recommended she stop her MAIC meds, even though the electrolyte problems may be SIADH from her lung disease and started last winter, before these meds. She will drink Gatorade and hopefully get better rapidly off the meds.She will keep appointment or call earlier if needed. We can discuss referral to ID to reconsider the [facility] when I seen her.

## 2012-08-28 DIAGNOSIS — N952 Postmenopausal atrophic vaginitis: Secondary | ICD-10-CM | POA: Diagnosis not present

## 2012-08-28 DIAGNOSIS — N3000 Acute cystitis without hematuria: Secondary | ICD-10-CM | POA: Diagnosis not present

## 2012-08-28 DIAGNOSIS — R3915 Urgency of urination: Secondary | ICD-10-CM | POA: Diagnosis not present

## 2012-08-31 ENCOUNTER — Other Ambulatory Visit (INDEPENDENT_AMBULATORY_CARE_PROVIDER_SITE_OTHER): Payer: Medicare Other

## 2012-08-31 ENCOUNTER — Ambulatory Visit (INDEPENDENT_AMBULATORY_CARE_PROVIDER_SITE_OTHER): Payer: Medicare Other | Admitting: Internal Medicine

## 2012-08-31 ENCOUNTER — Encounter: Payer: Self-pay | Admitting: Internal Medicine

## 2012-08-31 VITALS — BP 130/72 | HR 79 | Ht 68.0 in | Wt 153.0 lb

## 2012-08-31 DIAGNOSIS — J479 Bronchiectasis, uncomplicated: Secondary | ICD-10-CM

## 2012-08-31 LAB — CBC WITH DIFFERENTIAL/PLATELET
Basophils Relative: 1.1 % (ref 0.0–3.0)
Eosinophils Absolute: 0.3 10*3/uL (ref 0.0–0.7)
HCT: 33.9 % — ABNORMAL LOW (ref 36.0–46.0)
Hemoglobin: 11.6 g/dL — ABNORMAL LOW (ref 12.0–15.0)
MCHC: 34.4 g/dL (ref 30.0–36.0)
MCV: 85.6 fl (ref 78.0–100.0)
Monocytes Absolute: 0.9 10*3/uL (ref 0.1–1.0)
Neutro Abs: 5.4 10*3/uL (ref 1.4–7.7)
RBC: 3.96 Mil/uL (ref 3.87–5.11)

## 2012-08-31 LAB — HEPATIC FUNCTION PANEL
ALT: 13 U/L (ref 0–35)
Albumin: 3.6 g/dL (ref 3.5–5.2)
Total Bilirubin: 0.4 mg/dL (ref 0.3–1.2)

## 2012-08-31 LAB — BASIC METABOLIC PANEL
BUN: 14 mg/dL (ref 6–23)
Creatinine, Ser: 0.7 mg/dL (ref 0.4–1.2)
GFR: 83.21 mL/min (ref >60.00)

## 2012-08-31 NOTE — Patient Instructions (Addendum)
Order- lab CBC, BMET, Liver panel  Please call as needed  You can ask Dr Clarene Duke about home Physical therapy and Occupational Therapy to help you get stronger.

## 2012-08-31 NOTE — Progress Notes (Signed)
04/09/11- 85 yoF never smoker, followed for bronchiectasis/ chronic bronchitis, complicated by hx of GERD, DM. LOV-May 15, 2010 Has had flu vax. We called in a Zpak for bronchitis in August - she says it helped. She complains of weakness "tired" over the past several weeks- nothing specific- but has difficulty walking around home. Was having left neck pain. Dr Ernesto Rutherford ENT gave antibiotic. Dr Lawernce Pitts gave prednisone taper, xray'd neck. She says prednisone helped her neck pain. Tried a heating pad but burned herself. Cough also improved after the prednisone.  Denies fever, but has had some sweat. Weight up, then down. No chest pain and little residual dry cough.  09/30/11- 85 yoF never smoker, followed for bronchiectasis/ chronic bronchitis, complicated by hx of GERD, DM  Patient still c/o cough with clear mucus and sob. She complains of persistent weakness such that it can be hard to get out of bed in the morning. Legs give out. She doesn't describe claudication or exertional chest pain. Weakness is more important than shortness of breath. Scant clear mucus.  CXR 04/17/11 reviewed with her. IMPRESSION:  Stable patchy parenchymal opacities as before. No acute or  superimposed abnormality.  Original Report Authenticated By: Trecia Rogers, M.D.    11/14/11- 60 yoF never smoker, followed for bronchiectasis/ chronic bronchitis, complicated by hx of GERD, DM Still prod cough w/ clear mucus, feels the biaxin did help.  Was given medrol dose pak by Dr Trudie Reed She started Medrol today. She is not focused on her breathing at this visit, being actively uncomfortable with low back and bilateral hip pains. Got a steroid injection yesterday and has tramadol but resists taking pain medications.  01/16/12- 85 yoF never smoker, followed for bronchiectasis/ chronic bronchitis, complicated by hx of GERD, DM Cough-productive-clear in color; wheezing, feels as though not moving enough air. Had flu  vaccine Increased cough with clear mucus. Sometimes back pain catches her breath. Pro air is not enough. Taking one half tramadol for cough or pain but it can make her queasy. Does have some Tussionex at home. Occasionally uses her nebulizer with ipratropium. She has been very sensitive to stimulants. Watery nose-prefers Kleenex over medication. Back pain drags her down- going to orthopedist.  05/28/12- 85 yoF never smoker, followed for bronchiectasis/ chronic bronchitis, complicated by hx of GERD, DM FOLLOWS FOR: unable to catch breath at times; wheezing, SOB, and cough--productive at times-clear in color. Ortho injected hip- limits activity less than when last here.Had dental extraction and on "penicillin" for UTI. Using neb atrovent. Clear sputum, chronic cough. Uses Proair but makes her shake. Considering cosmetic surgery.  06/09/12- 85 yoF never smoker, followed for bronchiectasis/ chronic bronchitis, complicated by hx of GERD, DM FOLLOWS FOR: Per CY-review CT results in person with patient She returns, with her granddaughter, at my request so that I can go over her CT scan. This shows significant fibrocavitary scarring and nodules which could be quite consistent with MAIC. She has a very long history of bronchiectasis which had been fairly stable in the left lower lobe for many years, but this is more extensive and bilateral. I do not get a history that she has been aspirating. She denies night sweats and coughs up almost nothing. CT chest 06/01/12- IMPRESSION:  Progressive bilateral nodular bronchiectasis. Associated nodules  have increased in size including mass-like nodule within the right  lower lobe measuring up to 1.2 cm. It is possible these changes  represent findings of Mycobacterium avium intracellulare. Tumor  difficult to exclude.  Prominent atherosclerotic type changes as detailed above.  This is a call report.  Original Report Authenticated By: Lacy Duverney, M.D.  07/14/12-   85 yoF never smoker, followed for bronchiectasis/ chronic bronchitis/ MAIC, complicated by hx of GERD, DM   Start 07/14/12- Biaxin 500mg  TIW, EMB 1200 TIW, Rifabutin 300 TIW FOLLOWS FOR: review labs (sputum culture) in greater detail with patient; still having cough-productive-clear in color,; SOB and wheezing-worsened by activity. Chronic cough persists, scant clear mucus. Sometimes feels hot or cold with some night sweating. No blood, fever or swollen glands. Chronic back pain comes and goes. Sputum Cx 06/09/12 POS MAIC- discussed in detail with her, including discussion of medications. She has tried tramadol and Perles for cough.  08/31/12- 85 yoF never smoker, followed for bronchiectasis/ chronic bronchitis/ MAIC, complicated by hx of GERD, DM   Started 07/14/12- Biaxin 500mg  TIW, EMB 1200 TIW, Rifabutin 300 TIW  ended- 08/12/12 due to weakness and malaise.  FOLLOWS FOR: pt was put on abx's at last visit-pt states they made her sick. 2 hospital stays since 07-14-12 visit.  She says she is "sure" the triple therapy was helping her chest, but it made her too weak. She was hospitalized April 9 through April 12 because of weakness and then from April 14 2 April 16 with urinary infection and diarrhea-negative for C. Difficile. She now has her persistent baseline cough, mostly dry with scant clear sputum. She denies sweats or fever but stays chilly. She mentions her poor teeth and is considering dental implants-teeth affect her chewing/nutrition. CXR 07/29/12 IMPRESSION:  1. No acute cardiopulmonary process  2. No significant interval change in the appearance of known  varicose bronchiectasis and scarring within the lingula and the  dependent aspects of the upper and lower lobes bilaterally.  Original Report Authenticated By: Malachy Moan, M.D.  ROS-see HPI Constitutional:     No-weight losst, night sweats, no-fevers, ?chills,  No-fatigue, lassitude. HEENT:   No-  headaches, difficulty swallowing,  +tooth/dental problems, no-sore throat,       No-  sneezing, itching, ear ache, nasal congestion, post nasal drip,  CV:  No-   chest pain, orthopnea, PND, swelling in lower extremities, anasarca,  dizziness, palpitations Resp: +Mild  shortness of breath with exertion or at rest.              +   productive cough,  + non-productive cough,  No- coughing up of blood.              No-   change in color of mucus.  No- wheezing.   Skin: No-   rash or lesions. GI:  No-   heartburn, indigestion, abdominal pain, nausea, vomiting,  GU:  MS:  +Active low back and bilateral hip pain Neuro-     complaint of generalized weakness without myalgias Psych:  No- change in mood or affect. No depression or anxiety.  No memory loss.  General- Alert, Oriented, Affect-appropriate, Distress- none acute. Looks well/neatly groomed. Skin- rash-none,  excoriation- none Lymphadenopathy- none Head- atraumatic            Eyes- Gross vision intact, PERRLA, conjunctivae clear secretions            Ears- Hearing, canals-normal            Nose- Clear, no-Septal dev, mucus, polyps, erosion, perforation             Throat- Mallampati II , mucosa very dry , drainage- none, tonsils- atrophic Neck- flexible , trachea  midline, no stridor , thyroid nl, carotid no bruit Chest - symmetrical excursion , unlabored           Heart/CV- RRR , no murmur , no gallop  , no rub, nl s1 s2                           - JVD- none , edema- none, stasis changes- none, varices- none           Lung-  + dry cough, unlabored, no wheeze , dullness-none, rub- none. +few                        rhonchi left base. Chest wall-  Abd-  Br/ Gen/ Rectal- Not done, not indicated Extrem- + feet are a little dusky,  No-clubbing, none, atrophy- none, strength- nl for age.  Neuro- grossly intact to observation

## 2012-09-04 NOTE — Progress Notes (Signed)
Quick Note:  Pt aware of results and states she is only drinking Gatorade. ______

## 2012-09-11 ENCOUNTER — Encounter: Payer: Self-pay | Admitting: Internal Medicine

## 2012-09-11 NOTE — Assessment & Plan Note (Addendum)
I'm not entirely sure all of her malaise was due to her triple therapy. She had an episode of hyponatremia during the winter before starting these meds. Her bronchiectasis is clinically stable now. We agreed to watch her and give her time to get her strength back. If we see progression of her [facility], we have discussed referral for infectious disease consultation. Plan-update labs while she is here, but have available for her followup.

## 2012-09-22 ENCOUNTER — Telehealth: Payer: Self-pay | Admitting: Internal Medicine

## 2012-09-22 MED ORDER — IPRATROPIUM BROMIDE 0.02 % IN SOLN: 500.0000 ug | Freq: Four times a day (QID) | RESPIRATORY_TRACT | Status: DC | PRN

## 2012-09-22 MED ORDER — BUDESONIDE 0.25 MG/2ML IN SUSP: 0.2500 mg | Freq: Every day | RESPIRATORY_TRACT | Status: DC

## 2012-09-22 NOTE — Telephone Encounter (Signed)
Pt requesting her neb meds be called in. Pt states she has only been using the atrovent. Will send over  Both to [facility]. Nothing further needed.

## 2012-09-25 ENCOUNTER — Encounter: Payer: Self-pay | Admitting: Internal Medicine

## 2012-09-25 ENCOUNTER — Ambulatory Visit (INDEPENDENT_AMBULATORY_CARE_PROVIDER_SITE_OTHER): Payer: Medicare Other | Admitting: Internal Medicine

## 2012-09-25 ENCOUNTER — Other Ambulatory Visit (INDEPENDENT_AMBULATORY_CARE_PROVIDER_SITE_OTHER): Payer: Medicare Other

## 2012-09-25 VITALS — BP 116/70 | HR 74 | Ht 68.0 in | Wt 153.4 lb

## 2012-09-25 DIAGNOSIS — E871 Hypo-osmolality and hyponatremia: Secondary | ICD-10-CM

## 2012-09-25 DIAGNOSIS — M79604 Pain in right leg: Secondary | ICD-10-CM

## 2012-09-25 DIAGNOSIS — I2781 Cor pulmonale (chronic): Secondary | ICD-10-CM

## 2012-09-25 DIAGNOSIS — I279 Pulmonary heart disease, unspecified: Secondary | ICD-10-CM

## 2012-09-25 DIAGNOSIS — M79609 Pain in unspecified limb: Secondary | ICD-10-CM | POA: Diagnosis not present

## 2012-09-25 DIAGNOSIS — J479 Bronchiectasis, uncomplicated: Secondary | ICD-10-CM

## 2012-09-25 LAB — BASIC METABOLIC PANEL
CO2: 31 mEq/L (ref 19–32)
GFR: 71.46 mL/min (ref >60.00)
Glucose, Bld: 109 mg/dL — ABNORMAL HIGH (ref 70–99)
Potassium: 4.1 mEq/L (ref 3.5–5.1)
Sodium: 134 mEq/L — ABNORMAL LOW (ref 135–145)

## 2012-09-25 MED ORDER — FUROSEMIDE 40 MG PO TABS: ORAL_TABLET | ORAL | Status: DC

## 2012-09-25 MED ORDER — HYDROCOD POLST-CHLORPHEN POLST 10-8 MG/5ML PO LQCR: ORAL | Status: DC

## 2012-09-25 NOTE — Patient Instructions (Addendum)
Script sent to drug store for furosemide/ Lasix diuretic     Take one every other day, alternating with the Maxzide/ triamterene/HCTZ. Suggest you mark a kitchen calendar so you remember which to take on which days.  Script refill tussionex  Ok to continue the [employer]- lab BMET   Dx hyponatremia, Cor Pulmonale

## 2012-09-25 NOTE — Progress Notes (Signed)
04/09/11- 85 yoF never smoker, followed for bronchiectasis/ chronic bronchitis, complicated by hx of GERD, DM. LOV-May 15, 2010 Has had flu vax. We called in a Zpak for bronchitis in August - she says it helped. She complains of weakness "tired" over the past several weeks- nothing specific- but has difficulty walking around home. Was having left neck pain. Dr Ernesto Rutherford ENT gave antibiotic. Dr Lawernce Pitts gave prednisone taper, xray'd neck. She says prednisone helped her neck pain. Tried a heating pad but burned herself. Cough also improved after the prednisone.  Denies fever, but has had some sweat. Weight up, then down. No chest pain and little residual dry cough.  09/30/11- 85 yoF never smoker, followed for bronchiectasis/ chronic bronchitis, complicated by hx of GERD, DM  Patient still c/o cough with clear mucus and sob. She complains of persistent weakness such that it can be hard to get out of bed in the morning. Legs give out. She doesn't describe claudication or exertional chest pain. Weakness is more important than shortness of breath. Scant clear mucus.  CXR 04/17/11 reviewed with her. IMPRESSION:  Stable patchy parenchymal opacities as before. No acute or  superimposed abnormality.  Original Report Authenticated By: Trecia Rogers, M.D.    11/14/11- 60 yoF never smoker, followed for bronchiectasis/ chronic bronchitis, complicated by hx of GERD, DM Still prod cough w/ clear mucus, feels the biaxin did help.  Was given medrol dose pak by Dr Trudie Reed She started Medrol today. She is not focused on her breathing at this visit, being actively uncomfortable with low back and bilateral hip pains. Got a steroid injection yesterday and has tramadol but resists taking pain medications.  01/16/12- 85 yoF never smoker, followed for bronchiectasis/ chronic bronchitis, complicated by hx of GERD, DM Cough-productive-clear in color; wheezing, feels as though not moving enough air. Had flu  vaccine Increased cough with clear mucus. Sometimes back pain catches her breath. Pro air is not enough. Taking one half tramadol for cough or pain but it can make her queasy. Does have some Tussionex at home. Occasionally uses her nebulizer with ipratropium. She has been very sensitive to stimulants. Watery nose-prefers Kleenex over medication. Back pain drags her down- going to orthopedist.  05/28/12- 85 yoF never smoker, followed for bronchiectasis/ chronic bronchitis, complicated by hx of GERD, DM FOLLOWS FOR: unable to catch breath at times; wheezing, SOB, and cough--productive at times-clear in color. Ortho injected hip- limits activity less than when last here.Had dental extraction and on "penicillin" for UTI. Using neb atrovent. Clear sputum, chronic cough. Uses Proair but makes her shake. Considering cosmetic surgery.  06/09/12- 85 yoF never smoker, followed for bronchiectasis/ chronic bronchitis, complicated by hx of GERD, DM FOLLOWS FOR: Per CY-review CT results in person with patient She returns, with her granddaughter, at my request so that I can go over her CT scan. This shows significant fibrocavitary scarring and nodules which could be quite consistent with MAIC. She has a very long history of bronchiectasis which had been fairly stable in the left lower lobe for many years, but this is more extensive and bilateral. I do not get a history that she has been aspirating. She denies night sweats and coughs up almost nothing. CT chest 06/01/12- IMPRESSION:  Progressive bilateral nodular bronchiectasis. Associated nodules  have increased in size including mass-like nodule within the right  lower lobe measuring up to 1.2 cm. It is possible these changes  represent findings of Mycobacterium avium intracellulare. Tumor  difficult to exclude.  Prominent atherosclerotic type changes as detailed above.  This is a call report.  Original Report Authenticated By: Lacy Duverney, M.D.  07/14/12-   85 yoF never smoker, followed for bronchiectasis/ chronic bronchitis/ MAIC, complicated by hx of GERD, DM   Start 07/14/12- Biaxin 500mg  TIW, EMB 1200 TIW, Rifabutin 300 TIW FOLLOWS FOR: review labs (sputum culture) in greater detail with patient; still having cough-productive-clear in color,; SOB and wheezing-worsened by activity. Chronic cough persists, scant clear mucus. Sometimes feels hot or cold with some night sweating. No blood, fever or swollen glands. Chronic back pain comes and goes. Sputum Cx 06/09/12 POS MAIC- discussed in detail with her, including discussion of medications. She has tried tramadol and Perles for cough.  08/31/12- 85 yoF never smoker, followed for bronchiectasis/ chronic bronchitis/ MAIC, complicated by hx of GERD, DM   Started 07/14/12- Biaxin 500mg  TIW, EMB 1200 TIW, Rifabutin 300 TIW  ended- 08/12/12 due to weakness and malaise.  FOLLOWS FOR: pt was put on abx's at last visit-pt states they made her sick. 2 hospital stays since 07-14-12 visit.  She says she is "sure" the triple therapy was helping her chest, but it made her too weak. She was hospitalized April 9 through April 12 because of weakness and then from April 14 - April 16 with urinary infection and diarrhea-negative for C. Difficile. She now has her persistent baseline cough, mostly dry with scant clear sputum. She denies sweats or fever but stays chilly. She mentions her poor teeth and is considering dental implants-teeth affect her chewing/nutrition. CXR 07/29/12 IMPRESSION:  1. No acute cardiopulmonary process  2. No significant interval change in the appearance of known  varicose bronchiectasis and scarring within the lingula and the  dependent aspects of the upper and lower lobes bilaterally.  Original Report Authenticated By: Malachy Moan, M.D.  09/25/12- 26 yoF never smoker, followed for bronchiectasis/ chronic bronchitis/ MAIC, complicated by hx of GERD, DM   Started 07/14/12- Biaxin 500mg  TIW, EMB 1200  TIW, Rifabutin 300 TIW  ended- 08/12/12 due to weakness and malaise FOLLOWS FOR: still continues to have cough-productive-clear in color; worse in the morning but lasts through the day Ankles swell at ache some when she is up on him much during the day. Chronic back, hip and ankle pains. Her chronic cough he is near her baseline with clear sputum.  ROS-see HPI Constitutional:     No-weight losst, night sweats, no-fevers, ?chills,  No-fatigue, lassitude. HEENT:   No-  headaches, difficulty swallowing, +tooth/dental problems, no-sore throat,       No-  sneezing, itching, ear ache, nasal congestion, post nasal drip,  CV:  No-   chest pain, orthopnea, PND, +swelling in lower extremities, no-anasarca,  dizziness, palpitations Resp: +Mild  shortness of breath with exertion or at rest.              +   productive cough,  + non-productive cough,  No- coughing up of blood.              No-   change in color of mucus.  No- wheezing.   Skin: No-   rash or lesions. GI:  No-   heartburn, indigestion, abdominal pain, nausea, vomiting,  GU:  MS:  +Active low back and bilateral hip pain Neuro-     complaint of generalized weakness without myalgias Psych:  No- change in mood or affect. No depression or anxiety.  No memory loss.  General- Alert, Oriented, Affect-appropriate, Distress- none acute. Looks well/neatly groomed.  Skin- rash-none,  excoriation- none Lymphadenopathy- none Head- atraumatic            Eyes- Gross vision intact, PERRLA, conjunctivae clear secretions            Ears- Hearing, canals-normal            Nose- Clear, no-Septal dev, mucus, polyps, erosion, perforation             Throat- Mallampati II , mucosa very dry , drainage- none, tonsils- atrophic Neck- flexible , trachea midline, no stridor , thyroid nl, carotid no bruit Chest - symmetrical excursion , unlabored           Heart/CV- RRR , no murmur , no gallop  , no rub, nl s1 s2                           - JVD- none ,  edema-+trace, stasis changes- none, varices- none           Lung-  + dry cough, unlabored, no wheeze , dullness-none, rub- none.  Chest wall-  Abd-  Br/ Gen/ Rectal- Not done, not indicated Extrem- + feet are a little dusky,  No-clubbing, none, atrophy- none, strength- nl for age.  Neuro- grossly intact to observation

## 2012-09-26 DIAGNOSIS — M25579 Pain in unspecified ankle and joints of unspecified foot: Secondary | ICD-10-CM | POA: Diagnosis not present

## 2012-09-26 DIAGNOSIS — M76899 Other specified enthesopathies of unspecified lower limb, excluding foot: Secondary | ICD-10-CM | POA: Diagnosis not present

## 2012-09-29 NOTE — Progress Notes (Signed)
Quick Note:  Pt aware of results and next OV with CY. ______

## 2012-10-01 DIAGNOSIS — H353190 Nonexudative age-related macular degeneration, unspecified eye, stage unspecified: Secondary | ICD-10-CM | POA: Diagnosis not present

## 2012-10-07 ENCOUNTER — Encounter: Payer: Self-pay | Admitting: Internal Medicine

## 2012-10-07 ENCOUNTER — Ambulatory Visit (INDEPENDENT_AMBULATORY_CARE_PROVIDER_SITE_OTHER): Payer: Medicare Other | Admitting: Internal Medicine

## 2012-10-07 VITALS — BP 122/74 | HR 81 | Ht 68.0 in | Wt 151.4 lb

## 2012-10-07 DIAGNOSIS — A310 Pulmonary mycobacterial infection: Secondary | ICD-10-CM

## 2012-10-07 DIAGNOSIS — A318 Other mycobacterial infections: Secondary | ICD-10-CM | POA: Diagnosis not present

## 2012-10-07 DIAGNOSIS — J479 Bronchiectasis, uncomplicated: Secondary | ICD-10-CM | POA: Diagnosis not present

## 2012-10-07 MED ORDER — HYDROCOD POLST-CHLORPHEN POLST 10-8 MG/5ML PO LQCR: 5.0000 mL | Freq: Two times a day (BID) | ORAL | Status: DC | PRN

## 2012-10-07 MED ORDER — TRAMADOL HCL 50 MG PO TABS: ORAL_TABLET | ORAL | Status: DC

## 2012-10-07 NOTE — Patient Instructions (Addendum)
Scripts refilling tussionex and tramadol  Order- referral [facility] for [facility] with chronic bronchiectasis, intolerant of initial therapy

## 2012-10-07 NOTE — Progress Notes (Signed)
04/09/11- 85 yoF never smoker, followed for bronchiectasis/ chronic bronchitis, complicated by hx of GERD, DM. LOV-May 15, 2010 Has had flu vax. We called in a Zpak for bronchitis in August - she says it helped. She complains of weakness "tired" over the past several weeks- nothing specific- but has difficulty walking around home. Was having left neck pain. Dr Ernesto Rutherford ENT gave antibiotic. Dr Lawernce Pitts gave prednisone taper, xray'd neck. She says prednisone helped her neck pain. Tried a heating pad but burned herself. Cough also improved after the prednisone.  Denies fever, but has had some sweat. Weight up, then down. No chest pain and little residual dry cough.  09/30/11- 85 yoF never smoker, followed for bronchiectasis/ chronic bronchitis, complicated by hx of GERD, DM  Patient still c/o cough with clear mucus and sob. She complains of persistent weakness such that it can be hard to get out of bed in the morning. Legs give out. She doesn't describe claudication or exertional chest pain. Weakness is more important than shortness of breath. Scant clear mucus.  CXR 04/17/11 reviewed with her. IMPRESSION:  Stable patchy parenchymal opacities as before. No acute or  superimposed abnormality.  Original Report Authenticated By: Trecia Rogers, M.D.    11/14/11- 60 yoF never smoker, followed for bronchiectasis/ chronic bronchitis, complicated by hx of GERD, DM Still prod cough w/ clear mucus, feels the biaxin did help.  Was given medrol dose pak by Dr Trudie Reed She started Medrol today. She is not focused on her breathing at this visit, being actively uncomfortable with low back and bilateral hip pains. Got a steroid injection yesterday and has tramadol but resists taking pain medications.  01/16/12- 85 yoF never smoker, followed for bronchiectasis/ chronic bronchitis, complicated by hx of GERD, DM Cough-productive-clear in color; wheezing, feels as though not moving enough air. Had flu  vaccine Increased cough with clear mucus. Sometimes back pain catches her breath. Pro air is not enough. Taking one half tramadol for cough or pain but it can make her queasy. Does have some Tussionex at home. Occasionally uses her nebulizer with ipratropium. She has been very sensitive to stimulants. Watery nose-prefers Kleenex over medication. Back pain drags her down- going to orthopedist.  05/28/12- 85 yoF never smoker, followed for bronchiectasis/ chronic bronchitis, complicated by hx of GERD, DM FOLLOWS FOR: unable to catch breath at times; wheezing, SOB, and cough--productive at times-clear in color. Ortho injected hip- limits activity less than when last here.Had dental extraction and on "penicillin" for UTI. Using neb atrovent. Clear sputum, chronic cough. Uses Proair but makes her shake. Considering cosmetic surgery.  06/09/12- 85 yoF never smoker, followed for bronchiectasis/ chronic bronchitis, complicated by hx of GERD, DM FOLLOWS FOR: Per CY-review CT results in person with patient She returns, with her granddaughter, at my request so that I can go over her CT scan. This shows significant fibrocavitary scarring and nodules which could be quite consistent with MAIC. She has a very long history of bronchiectasis which had been fairly stable in the left lower lobe for many years, but this is more extensive and bilateral. I do not get a history that she has been aspirating. She denies night sweats and coughs up almost nothing. CT chest 06/01/12- IMPRESSION:  Progressive bilateral nodular bronchiectasis. Associated nodules  have increased in size including mass-like nodule within the right  lower lobe measuring up to 1.2 cm. It is possible these changes  represent findings of Mycobacterium avium intracellulare. Tumor  difficult to exclude.  Prominent atherosclerotic type changes as detailed above.  This is a call report.  Original Report Authenticated By: Lacy Duverney, M.D.  07/14/12-   85 yoF never smoker, followed for bronchiectasis/ chronic bronchitis/ MAIC, complicated by hx of GERD, DM   Start 07/14/12- Biaxin 500mg  TIW, EMB 1200 TIW, Rifabutin 300 TIW FOLLOWS FOR: review labs (sputum culture) in greater detail with patient; still having cough-productive-clear in color,; SOB and wheezing-worsened by activity. Chronic cough persists, scant clear mucus. Sometimes feels hot or cold with some night sweating. No blood, fever or swollen glands. Chronic back pain comes and goes. Sputum Cx 06/09/12 POS MAIC- discussed in detail with her, including discussion of medications. She has tried tramadol and Perles for cough.  08/31/12- 85 yoF never smoker, followed for bronchiectasis/ chronic bronchitis/ MAIC, complicated by hx of GERD, DM   Started 07/14/12- Biaxin 500mg  TIW, EMB 1200 TIW, Rifabutin 300 TIW  ended- 08/12/12 due to weakness and malaise.  FOLLOWS FOR: pt was put on abx's at last visit-pt states they made her sick. 2 hospital stays since 07-14-12 visit.  She says she is "sure" the triple therapy was helping her chest, but it made her too weak. She was hospitalized April 9 through April 12 because of weakness and then from April 14 - April 16 with urinary infection and diarrhea-negative for C. Difficile. She now has her persistent baseline cough, mostly dry with scant clear sputum. She denies sweats or fever but stays chilly. She mentions her poor teeth and is considering dental implants-teeth affect her chewing/nutrition. CXR 07/29/12 IMPRESSION:  1. No acute cardiopulmonary process  2. No significant interval change in the appearance of known  varicose bronchiectasis and scarring within the lingula and the  dependent aspects of the upper and lower lobes bilaterally.  Original Report Authenticated By: Malachy Moan, M.D.  09/25/12- 67 yoF never smoker, followed for bronchiectasis/ chronic bronchitis/ MAIC, complicated by hx of GERD, DM   Started 07/14/12- Biaxin 500mg  TIW, EMB 1200  TIW, Rifabutin 300 TIW  ended- 08/12/12 due to weakness and malaise FOLLOWS FOR: still continues to have cough-productive-clear in color; worse in the morning but lasts through the day  10/07/12- 85 yoF never smoker, followed for bronchiectasis/ chronic bronchitis/ MAIC, complicated by hx of GERD, DM   Started 07/14/12- Biaxin 500mg  TIW, EMB 1200 TIW, Rifabutin 300 TIW  ended- 08/12/12 due to weakness and malaise    Has grandson with her today. FOLLOWS FOR: continues to have cough-same as last OV-productive-clear in color; needs RX for Tussionex as she does not recall getting printed Rx at last OV. Also, would like  RX for Tramadol. Dr Darrelyn Hillock treated prednisone taper for sore ankles. Cough pattern has not changed, reflecting her known bronchiectasis. Denies fever, blood, chest pain, purulent sputum.  ROS-see HPI Constitutional:     No-weight losst, night sweats, no-fevers, ?chills,  No-fatigue, lassitude. HEENT:   No-  headaches, difficulty swallowing, +tooth/dental problems, no-sore throat,       No-  sneezing, itching, ear ache, nasal congestion, post nasal drip,  CV:  No-   chest pain, orthopnea, PND, swelling in lower extremities, anasarca,  dizziness, palpitations Resp: +Mild  shortness of breath with exertion or at rest.              +   productive cough,  + non-productive cough,  No- coughing up of blood.              No-   change in color of mucus.  No- wheezing.   Skin: No-   rash or lesions. GI:  No-   heartburn, indigestion, abdominal pain, nausea, vomiting,  GU:  MS:  +Active low back and bilateral hip pain Neuro-     complaint of generalized weakness without myalgias Psych:  No- change in mood or affect. No depression or anxiety.  No memory loss.  General- Alert, Oriented, Affect-appropriate, Distress- none acute. Looks well/neatly groomed. Skin- rash-none,  excoriation- none Lymphadenopathy- none Head- atraumatic            Eyes- Gross vision intact, PERRLA, conjunctivae clear  secretions            Ears- Hearing, canals-normal            Nose- Clear, no-Septal dev, mucus, polyps, erosion, perforation             Throat- Mallampati II , mucosa very dry , drainage- none, tonsils- atrophic Neck- flexible , trachea midline, no stridor , thyroid nl, carotid no bruit Chest - symmetrical excursion , unlabored           Heart/CV- RRR , no murmur , no gallop  , no rub, nl s1 s2                           - JVD- none , edema- none, stasis changes- none, varices- none           Lung-  + dry cough. +  Crackles L>R  , unlabored, no wheeze , dullness-none, rub- none.  Chest wall-  Abd-  Br/ Gen/ Rectal- Not done, not indicated Extrem- + feet are a little dusky,  No-clubbing, none, atrophy- none, strength- nl for age.  Neuro- grossly intact to observation

## 2012-10-08 ENCOUNTER — Encounter: Payer: Self-pay | Admitting: Internal Medicine

## 2012-10-08 ENCOUNTER — Ambulatory Visit (INDEPENDENT_AMBULATORY_CARE_PROVIDER_SITE_OTHER): Payer: Medicare Other | Admitting: Internal Medicine

## 2012-10-08 VITALS — BP 171/78 | HR 88 | Temp 97.6°F | Ht 68.0 in | Wt 151.0 lb

## 2012-10-08 DIAGNOSIS — A319 Mycobacterial infection, unspecified: Secondary | ICD-10-CM

## 2012-10-08 DIAGNOSIS — J471 Bronchiectasis with (acute) exacerbation: Secondary | ICD-10-CM | POA: Diagnosis not present

## 2012-10-08 MED ORDER — AZITHROMYCIN 500 MG PO TABS: 500.0000 mg | ORAL_TABLET | ORAL | Status: DC

## 2012-10-08 MED ORDER — LEVOFLOXACIN 500 MG PO TABS: 500.0000 mg | ORAL_TABLET | Freq: Every day | ORAL | Status: DC

## 2012-10-08 MED ORDER — RIFABUTIN 150 MG PO CAPS: 300.0000 mg | ORAL_CAPSULE | ORAL | Status: DC

## 2012-10-08 NOTE — Progress Notes (Signed)
  Subjective:    Patient ID: Katie Alvarado, female    DOB: [birth year], 79 y.o.   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 She has a long history of bronchiectasis and is followed by pulmonary, Dr. Maple Hudson.  She has seen him since I believe 2008 4 bronchiectasis. She has never previously been on therapy for MAC and was recently diagnosed with MAC on the sputum culture in February of this year. She has not had a repeat sputum but her clinical syndrome of bronchiectasis and the positive culture to suggest clinical MAC. She was started by Dr. Maple Hudson on therapy with clarithromycin, rifabutin, and ethambutol 3 times a week. She unfortunately did not tolerate the medicine well developing weakness and required hospitalization. She does though total knee she noted an improvement in her respiratory system while on therapy with less cough.  Imaging was reviewed and she has had some increased nodularity.  She has no jugular fever or chills. She has had a notable decline symptomatically per her report over the last 6-12 months. Her AFB smear was negative but culture did grow mycobacterial avium complex. She is hopeful for some relief from her symptoms in regards to treatment of this possible infection. She has had no significant weight loss. She developed no diarrhea from the treatment.   Review of Systems  Constitutional: Positive for activity change. Negative for fever, appetite change and unexpected weight change.  HENT: Negative for congestion and sore throat.   Respiratory: Positive for cough and shortness of breath. Negative for chest tightness and wheezing.   Cardiovascular: Negative for chest pain and leg swelling.  Gastrointestinal: Negative for nausea, diarrhea and abdominal distention.  Musculoskeletal: Negative for myalgias and arthralgias.  Skin: Negative for rash.  Neurological: Negative for dizziness, light-headedness and  headaches.  Hematological: Negative for adenopathy.  Psychiatric/Behavioral: Negative for dysphoric mood.       Objective:   Physical Exam  Constitutional: She is oriented to person, place, and time. She appears well-developed.  HENT:  Mouth/Throat: No oropharyngeal exudate.  Eyes: No scleral icterus.  Cardiovascular: Normal rate, regular rhythm and normal heart sounds.   No murmur heard. Pulmonary/Chest: Effort normal and breath sounds normal. No respiratory distress.  Lymphadenopathy:    She has no cervical adenopathy.  Neurological: She is alert and oriented to person, place, and time.  Skin: Skin is warm and dry. No rash noted.  Psychiatric: She has a normal mood and affect. Her behavior is normal.          Assessment & Plan:

## 2012-10-08 NOTE — Assessment & Plan Note (Signed)
She does have bronchiectasis and the clinical syndrome that may be consistent with MAC and a positive culture with an apparent response to therapy in regards to her pulmonary system. Unfortunately, she has been unable to tolerate the treatment. Therefore, a second best option may be changing her to azithromycin which may have a little better tolerance then clarithromycin and change ethambutol which does cause some nausea and vomiting as well as weakness to Levaquin and also continue the rifabutin. She knows this is essentially unproven as a combination however she also understands that treatment of this is difficult and often despite this therapy does not make much improvement either because the disease progresses and the mycobacterial infection is not causing exacerbation or that the mycobacterial despite antibiotics does not disappear.  I will start her on a twice a week therapy with this regimen to see if she tolerates it over the next 2 weeks, then I will have her return and if she is tolerating it we'll try to push to 3 times a week. If she is unable to tolerate, another option may be lower dose is azithromycin and lower rifabutin but will need to be taken daily. This has worked in other patients. She also understands that if she is unable to take any medications or to intolerance, there will be nothing further from an infectious disease standpoint to offer and will need to focus on symptomatic therapy. I did explain the difficulty in treating this infection and little known benefit to the treatments. 

## 2012-10-08 NOTE — Assessment & Plan Note (Deleted)
She does have bronchiectasis and the clinical syndrome that may be consistent with MAC and a positive culture with an apparent response to therapy in regards to her pulmonary system. Unfortunately, she has been unable to tolerate the treatment. Therefore, a second best option may be changing her to azithromycin which may have a little better tolerance then clarithromycin and change ethambutol which does cause some nausea and vomiting as well as weakness to Levaquin and also continue the rifabutin. She knows this is essentially unproven as a combination however she also understands that treatment of this is difficult and often despite this therapy does not make much improvement either because the disease progresses and the mycobacterial infection is not causing exacerbation or that the mycobacterial despite antibiotics does not disappear.  I will start her on a twice a week therapy with this regimen to see if she tolerates it over the next 2 weeks, then I will have her return and if she is tolerating it we'll try to push to 3 times a week. If she is unable to tolerate, another option may be lower dose is azithromycin and lower rifabutin but will need to be taken daily. This has worked in other patients. She also understands that if she is unable to take any medications or to intolerance, there will be nothing further from an infectious disease standpoint to offer and will need to focus on symptomatic therapy. I did explain the difficulty in treating this infection and little known benefit to the treatments.

## 2012-10-09 NOTE — Assessment & Plan Note (Signed)
I have told her if this flares again exercise option to refer for infectious disease consultation.

## 2012-10-09 NOTE — Assessment & Plan Note (Signed)
There is mild edema. Katie Alvarado is negative and it doesn't seem that she is describing claudication. Plan-Will probably manage as cor pulmonale. Try Lasix alternating with Maxzide every other day. BMET

## 2012-10-13 DIAGNOSIS — R5383 Other fatigue: Secondary | ICD-10-CM | POA: Diagnosis not present

## 2012-10-13 DIAGNOSIS — R82998 Other abnormal findings in urine: Secondary | ICD-10-CM | POA: Diagnosis not present

## 2012-10-13 DIAGNOSIS — R5381 Other malaise: Secondary | ICD-10-CM | POA: Diagnosis not present

## 2012-10-13 DIAGNOSIS — J479 Bronchiectasis, uncomplicated: Secondary | ICD-10-CM | POA: Diagnosis not present

## 2012-10-14 ENCOUNTER — Telehealth: Payer: Self-pay | Admitting: *Deleted

## 2012-10-14 NOTE — Telephone Encounter (Signed)
Patient called to clarify dosing instructions for her new MAC regimen.  Per Dr. Luciana Axe, her new medication regimen is azithromycin 500mg  twice a week, levaquin 500mg  twice a week, and rifabutin 300mg  twice a week.  Patient's neighbor Gigi Gin read the instructions on the bottle labels.  The instructions on the bottle were incorrect.  Peggy and the patient both verbalized understanding of the prescribed therapy.  Patient will take 2 azithromycin tablets (250mg  each), 1 levaquin tablet (500mg  each), and 2 rifabutin tablets (150mg  each) on Wednesdays and Saturdays.  Patient began this regimen today.  Her follow up appointment was rescheduled for 11/03/12 to see how she is tolerating the reduced regimen. Pt knows to call the clinic if she has any trouble tolerating this new schedule. Andree Coss, RN

## 2012-10-20 DIAGNOSIS — H353190 Nonexudative age-related macular degeneration, unspecified eye, stage unspecified: Secondary | ICD-10-CM | POA: Diagnosis not present

## 2012-10-20 DIAGNOSIS — H35439 Paving stone degeneration of retina, unspecified eye: Secondary | ICD-10-CM | POA: Diagnosis not present

## 2012-10-20 DIAGNOSIS — H43819 Vitreous degeneration, unspecified eye: Secondary | ICD-10-CM | POA: Diagnosis not present

## 2012-10-20 DIAGNOSIS — E119 Type 2 diabetes mellitus without complications: Secondary | ICD-10-CM | POA: Diagnosis not present

## 2012-10-22 ENCOUNTER — Ambulatory Visit: Payer: Medicare Other | Admitting: Internal Medicine

## 2012-10-24 NOTE — Assessment & Plan Note (Signed)
Watching impact of recent prednisone therapy on her symptoms. Previously diagnosed MAIC based on expectorated sputum. She failed to tolerate triple therapy because of nausea, dehydration and electrolyte imbalance. She struggles with her chronic hip pain but is at her pulmonary baseline. I had recommended we seek a second opinion from the Infectious Disease specialists for management of her [facility].

## 2012-10-26 ENCOUNTER — Telehealth: Payer: Self-pay | Admitting: *Deleted

## 2012-10-26 ENCOUNTER — Telehealth: Payer: Self-pay | Admitting: Licensed Clinical Social Worker

## 2012-10-26 NOTE — Telephone Encounter (Signed)
Patient starting having body itching 2 days ago, no rash present. The itching is constant and started shortly after starting medications for MAC. Please advise

## 2012-10-26 NOTE — Telephone Encounter (Signed)
Called patient back per Dr Drue Second and advised the patient to come in to be seen. Offered the patient an appt 10/27/12 at 2 pm and she advised she will take that and see is then.

## 2012-10-27 ENCOUNTER — Telehealth: Payer: Self-pay | Admitting: *Deleted

## 2012-10-27 ENCOUNTER — Ambulatory Visit: Payer: Medicare Other | Admitting: Internal Medicine

## 2012-10-27 NOTE — Telephone Encounter (Signed)
Patient called to advise she will not be able to make it to clinic today and was rescheduled to 11/03/12.

## 2012-10-28 NOTE — Telephone Encounter (Signed)
Can you fit her into my schedule as an overbook

## 2012-10-29 NOTE — Telephone Encounter (Signed)
Patient is feeling better and will follow up at regular visit.

## 2012-11-03 ENCOUNTER — Ambulatory Visit (INDEPENDENT_AMBULATORY_CARE_PROVIDER_SITE_OTHER): Payer: Medicare Other | Admitting: Internal Medicine

## 2012-11-03 ENCOUNTER — Encounter: Payer: Self-pay | Admitting: Internal Medicine

## 2012-11-03 VITALS — BP 125/78 | HR 84 | Temp 98.0°F | Ht 68.0 in | Wt 148.0 lb

## 2012-11-03 DIAGNOSIS — J479 Bronchiectasis, uncomplicated: Secondary | ICD-10-CM | POA: Diagnosis not present

## 2012-11-03 DIAGNOSIS — M76899 Other specified enthesopathies of unspecified lower limb, excluding foot: Secondary | ICD-10-CM | POA: Diagnosis not present

## 2012-11-03 MED ORDER — LEVOFLOXACIN 500 MG PO TABS: 500.0000 mg | ORAL_TABLET | ORAL | Status: DC

## 2012-11-03 MED ORDER — RIFABUTIN 150 MG PO CAPS: 300.0000 mg | ORAL_CAPSULE | ORAL | Status: DC

## 2012-11-03 MED ORDER — AZITHROMYCIN 500 MG PO TABS: 500.0000 mg | ORAL_TABLET | ORAL | Status: DC

## 2012-11-04 NOTE — Progress Notes (Signed)
  Subjective:    Patient ID: Katie Alvarado, female    DOB: July 29, 1925, 79 y.o.   MRN: [id number]  HPI She comes in here on treatment for mycobacterial avium complex infection. She has a long history of bronchiectasis and is followed by pulmonary, Dr. Maple Hudson. She has seen him since I believe 2008 4 bronchiectasis. She has never previously been on therapy for MAC and was recently diagnosed with MAC on the sputum culture in February of this year. She has not had a repeat sputum but her clinical syndrome of bronchiectasis and the positive culture to suggest clinical MAC. She was started by Dr. Maple Hudson on therapy with clarithromycin, rifabutin, and ethambutol 3 times a week. She unfortunately did not tolerate the medicine well developing weakness and required hospitalization. She does though total knee she noted an improvement in her respiratory system while on therapy with less cough. Imaging was reviewed and she has had some increased nodularity. She has no jugular fever or chills. She has had a notable decline symptomatically per her report over the last 6-12 months. Her AFB smear was negative but culture did grow mycobacterial avium complex. She is hopeful for some relief from her symptoms in regards to treatment of this possible infection. She has had no significant weight loss. She developed no diarrhea from the treatment.   I started her last visit on azithromycin, rifabutin and levaquin 2 times a week.  She has tolerated this well.  No diarrhea, no rash.  She says her symptoms also seem to have improved.      Review of Systems  Constitutional: Negative for fever, chills and fatigue.  HENT: Negative for sore throat and trouble swallowing.   Eyes: Negative for visual disturbance.  Respiratory: Negative for cough and shortness of breath.   Cardiovascular: Negative for chest pain.  Gastrointestinal: Negative for nausea, vomiting, abdominal pain and diarrhea.  Skin: Negative for rash.  Neurological:  Negative for dizziness, light-headedness and headaches.  Hematological: Negative for adenopathy.  Psychiatric/Behavioral: Negative for dysphoric mood.       Objective:   Physical Exam  Constitutional: She appears well-developed and well-nourished. No distress.  HENT:  Mouth/Throat: No oropharyngeal exudate.  Eyes: Right eye exhibits no discharge. Left eye exhibits no discharge. No scleral icterus.  Cardiovascular: Normal rate, regular rhythm and normal heart sounds.   No murmur heard. Pulmonary/Chest: Effort normal and breath sounds normal. No respiratory distress. She has no wheezes. She has no rales.  Lymphadenopathy:    She has no cervical adenopathy.  Skin: Skin is warm and dry. No rash noted.  Psychiatric: She has a normal mood and affect. Her behavior is normal.          Assessment & Plan:

## 2012-11-04 NOTE — Assessment & Plan Note (Signed)
She is doing well on this regimen.  She understands that this is not first line.  I will have her increase to 3 times per week and continue her on that as long as she tolerates it.  Will follow up in 1 month.

## 2012-11-06 DIAGNOSIS — M76899 Other specified enthesopathies of unspecified lower limb, excluding foot: Secondary | ICD-10-CM | POA: Diagnosis not present

## 2012-11-09 DIAGNOSIS — E871 Hypo-osmolality and hyponatremia: Secondary | ICD-10-CM | POA: Diagnosis not present

## 2012-11-13 DIAGNOSIS — M76899 Other specified enthesopathies of unspecified lower limb, excluding foot: Secondary | ICD-10-CM | POA: Diagnosis not present

## 2012-11-27 DIAGNOSIS — M48061 Spinal stenosis, lumbar region without neurogenic claudication: Secondary | ICD-10-CM | POA: Diagnosis not present

## 2012-11-27 DIAGNOSIS — M76899 Other specified enthesopathies of unspecified lower limb, excluding foot: Secondary | ICD-10-CM | POA: Diagnosis not present

## 2012-11-30 ENCOUNTER — Telehealth: Payer: Self-pay | Admitting: *Deleted

## 2012-11-30 NOTE — Telephone Encounter (Signed)
Pt calling to cancel her 1 mo f/u appointment on 12/01/12 d/t problems with increasing back pain and treatment for that.  Pt reports she is tolerating her antibiotics without problem at this time. SHe will call to reschedule after her back pain is under control. Andree Coss, RN

## 2012-12-01 ENCOUNTER — Ambulatory Visit: Payer: Medicare Other | Admitting: Internal Medicine

## 2012-12-02 ENCOUNTER — Other Ambulatory Visit: Payer: Self-pay | Admitting: Orthopedic Surgery

## 2012-12-02 DIAGNOSIS — M549 Dorsalgia, unspecified: Secondary | ICD-10-CM

## 2012-12-07 ENCOUNTER — Ambulatory Visit
Admission: RE | Admit: 2012-12-07 | Discharge: 2012-12-07 | Disposition: A | Discharge status: Home / Self Care | Payer: Medicare Other | Source: Ambulatory Visit | Attending: Orthopedic Surgery | Admitting: Orthopedic Surgery

## 2012-12-07 VITALS — BP 103/61 | HR 75

## 2012-12-07 DIAGNOSIS — M549 Dorsalgia, unspecified: Secondary | ICD-10-CM

## 2012-12-07 DIAGNOSIS — M79604 Pain in right leg: Secondary | ICD-10-CM

## 2012-12-07 MED ORDER — IOHEXOL 180 MG/ML  SOLN: 15.0000 mL | Freq: Once | INTRAMUSCULAR | Status: DC | PRN

## 2012-12-07 MED ORDER — ONDANSETRON HCL 4 MG/2ML IJ SOLN
4.0000 mg | Freq: Once | INTRAMUSCULAR | Status: AC
  Administered 2012-12-07: 4 mg via INTRAMUSCULAR

## 2012-12-07 MED ORDER — DIAZEPAM 5 MG PO TABS
5.0000 mg | ORAL_TABLET | Freq: Once | ORAL | Status: AC
  Administered 2012-12-07: 5 mg via ORAL

## 2012-12-07 MED ORDER — MEPERIDINE HCL 100 MG/ML IJ SOLN
50.0000 mg | Freq: Once | INTRAMUSCULAR | Status: AC
  Administered 2012-12-07: 50 mg via INTRAMUSCULAR

## 2012-12-08 ENCOUNTER — Telehealth: Payer: Self-pay | Admitting: Internal Medicine

## 2012-12-08 NOTE — Telephone Encounter (Signed)
Received 6 pages from [facility], sent to Dr. Maple Hudson. 12/08/12/ss

## 2012-12-11 DIAGNOSIS — M76899 Other specified enthesopathies of unspecified lower limb, excluding foot: Secondary | ICD-10-CM | POA: Diagnosis not present

## 2012-12-18 DIAGNOSIS — M48061 Spinal stenosis, lumbar region without neurogenic claudication: Secondary | ICD-10-CM | POA: Diagnosis not present

## 2013-01-06 DIAGNOSIS — M545 Low back pain: Secondary | ICD-10-CM | POA: Diagnosis not present

## 2013-01-06 DIAGNOSIS — Z23 Encounter for immunization: Secondary | ICD-10-CM | POA: Diagnosis not present

## 2013-01-13 DIAGNOSIS — M48061 Spinal stenosis, lumbar region without neurogenic claudication: Secondary | ICD-10-CM | POA: Diagnosis not present

## 2013-01-14 ENCOUNTER — Telehealth: Payer: Self-pay | Admitting: Internal Medicine

## 2013-01-14 NOTE — Telephone Encounter (Signed)
Called, spoke with pt.  Reports she needs to have BACK surgery.  This is not scheduled yet.  Pt would like to see Dr. Maple Hudson prior to scheduling the surgery.  She has a pending OV with him on Oct 20 but she would like to be seen sooner.  He had cancellation on Monday, Sept 29, 2014 at 2:14pm.  Pt ok with this appt.  We have rescheduled the Oct appt to Sept 29.  Pt aware.

## 2013-01-18 ENCOUNTER — Encounter: Payer: Self-pay | Admitting: Internal Medicine

## 2013-01-18 ENCOUNTER — Ambulatory Visit (INDEPENDENT_AMBULATORY_CARE_PROVIDER_SITE_OTHER)
Admission: RE | Admit: 2013-01-18 | Discharge: 2013-01-18 | Disposition: A | Discharge status: Home / Self Care | Payer: Medicare Other | Source: Ambulatory Visit | Attending: Internal Medicine | Admitting: Internal Medicine

## 2013-01-18 ENCOUNTER — Ambulatory Visit (INDEPENDENT_AMBULATORY_CARE_PROVIDER_SITE_OTHER): Payer: Medicare Other | Admitting: Internal Medicine

## 2013-01-18 VITALS — BP 120/76 | HR 79 | Ht 68.0 in | Wt 152.4 lb

## 2013-01-18 DIAGNOSIS — J479 Bronchiectasis, uncomplicated: Secondary | ICD-10-CM

## 2013-01-18 DIAGNOSIS — J449 Chronic obstructive pulmonary disease, unspecified: Secondary | ICD-10-CM | POA: Diagnosis not present

## 2013-01-18 NOTE — Assessment & Plan Note (Addendum)
Office spirometry 01/18/13- moderate obstructive airways disease-FVC 2.10/69%, FEV1 1.25/58%,  FEV1/FVC 0.59/ 84%, FEF 25-75% 0.47/33% She has known chronic bronchiectasis which is clinically stable. She has a chronic cough which is usually productive of nonpurulent sputum. She does not have obvious clinical infection at this time. I think she can tolerate necessary surgery with general anesthesia. I explained that she is at increased risk for cardiopulmonary complications including pneumonia, prolonged time on ventilator, and cardiovascular events including possible death or prolonged stay in a SNF. There is not much that can be done now to improve her operative risk, so she has to decide if she  is willing to accept that risk in hopes of reducing her back pain and improving her quality of life. She will need close observation while in hospital, with attention to secretion clearance/pulmonary toilet. PCCM can assist if needed.

## 2013-01-18 NOTE — Progress Notes (Signed)
04/09/11- 85 yoF never smoker, followed for bronchiectasis/ chronic bronchitis, complicated by hx of GERD, DM. LOV-May 15, 2010 Has had flu vax. We called in a Zpak for bronchitis in August - she says it helped. She complains of weakness "tired" over the past several weeks- nothing specific- but has difficulty walking around home. Was having left neck pain. Dr Ernesto Rutherford ENT gave antibiotic. Dr Lawernce Pitts gave prednisone taper, xray'd neck. She says prednisone helped her neck pain. Tried a heating pad but burned herself. Cough also improved after the prednisone.  Denies fever, but has had some sweat. Weight up, then down. No chest pain and little residual dry cough.  09/30/11- 85 yoF never smoker, followed for bronchiectasis/ chronic bronchitis, complicated by hx of GERD, DM  Patient still c/o cough with clear mucus and sob. She complains of persistent weakness such that it can be hard to get out of bed in the morning. Legs give out. She doesn't describe claudication or exertional chest pain. Weakness is more important than shortness of breath. Scant clear mucus.  CXR 04/17/11 reviewed with her. IMPRESSION:  Stable patchy parenchymal opacities as before. No acute or  superimposed abnormality.  Original Report Authenticated By: Trecia Rogers, M.D.    11/14/11- 60 yoF never smoker, followed for bronchiectasis/ chronic bronchitis, complicated by hx of GERD, DM Still prod cough w/ clear mucus, feels the biaxin did help.  Was given medrol dose pak by Dr Trudie Reed She started Medrol today. She is not focused on her breathing at this visit, being actively uncomfortable with low back and bilateral hip pains. Got a steroid injection yesterday and has tramadol but resists taking pain medications.  01/16/12- 85 yoF never smoker, followed for bronchiectasis/ chronic bronchitis, complicated by hx of GERD, DM Cough-productive-clear in color; wheezing, feels as though not moving enough air. Had flu  vaccine Increased cough with clear mucus. Sometimes back pain catches her breath. Pro air is not enough. Taking one half tramadol for cough or pain but it can make her queasy. Does have some Tussionex at home. Occasionally uses her nebulizer with ipratropium. She has been very sensitive to stimulants. Watery nose-prefers Kleenex over medication. Back pain drags her down- going to orthopedist.  05/28/12- 85 yoF never smoker, followed for bronchiectasis/ chronic bronchitis, complicated by hx of GERD, DM FOLLOWS FOR: unable to catch breath at times; wheezing, SOB, and cough--productive at times-clear in color. Ortho injected hip- limits activity less than when last here.Had dental extraction and on "penicillin" for UTI. Using neb atrovent. Clear sputum, chronic cough. Uses Proair but makes her shake. Considering cosmetic surgery.  06/09/12- 85 yoF never smoker, followed for bronchiectasis/ chronic bronchitis, complicated by hx of GERD, DM FOLLOWS FOR: Per CY-review CT results in person with patient She returns, with her granddaughter, at my request so that I can go over her CT scan. This shows significant fibrocavitary scarring and nodules which could be quite consistent with MAIC. She has a very long history of bronchiectasis which had been fairly stable in the left lower lobe for many years, but this is more extensive and bilateral. I do not get a history that she has been aspirating. She denies night sweats and coughs up almost nothing. CT chest 06/01/12- IMPRESSION:  Progressive bilateral nodular bronchiectasis. Associated nodules  have increased in size including mass-like nodule within the right  lower lobe measuring up to 1.2 cm. It is possible these changes  represent findings of Mycobacterium avium intracellulare. Tumor  difficult to exclude.  Prominent atherosclerotic type changes as detailed above.  This is a call report.  Original Report Authenticated By: Genia Del, M.D.  07/14/12-   85 yoF never smoker, followed for bronchiectasis/ chronic bronchitis/ MAIC, complicated by hx of GERD, DM   Start 07/14/12- Biaxin 500mg  TIW, EMB 1200 TIW, Rifabutin 300 TIW FOLLOWS FOR: review labs (sputum culture) in greater detail with patient; still having cough-productive-clear in color,; SOB and wheezing-worsened by activity. Chronic cough persists, scant clear mucus. Sometimes feels hot or cold with some night sweating. No blood, fever or swollen glands. Chronic back pain comes and goes. Sputum Cx 06/09/12 POS MAIC- discussed in detail with her, including discussion of medications. She has tried tramadol and Perles for cough.  08/31/12- 85 yoF never smoker, followed for bronchiectasis/ chronic bronchitis/ MAIC, complicated by hx of GERD, DM   Started 07/14/12- Biaxin 500mg  TIW, EMB 1200 TIW, Rifabutin 300 TIW  ended- 08/12/12 due to weakness and malaise.  FOLLOWS FOR: pt was put on abx's at last visit-pt states they made her sick. 2 hospital stays since 07-14-12 visit.  She says she is "sure" the triple therapy was helping her chest, but it made her too weak. She was hospitalized April 9 through April 12 because of weakness and then from April 14 - April 16 with urinary infection and diarrhea-negative for C. Difficile. She now has her persistent baseline cough, mostly dry with scant clear sputum. She denies sweats or fever but stays chilly. She mentions her poor teeth and is considering dental implants-teeth affect her chewing/nutrition. CXR 07/29/12 IMPRESSION:  1. No acute cardiopulmonary process  2. No significant interval change in the appearance of known  varicose bronchiectasis and scarring within the lingula and the  dependent aspects of the upper and lower lobes bilaterally.  Original Report Authenticated By: Jacqulynn Cadet, M.D.  09/25/12- 3 yoF never smoker, followed for bronchiectasis/ chronic bronchitis/ MAIC, complicated by hx of GERD, DM   Started 07/14/12- Biaxin 500mg  TIW, EMB 1200  TIW, Rifabutin 300 TIW  ended- 08/12/12 due to weakness and malaise FOLLOWS FOR: still continues to have cough-productive-clear in color; worse in the morning but lasts through the day  10/07/12- 85 yoF never smoker, followed for bronchiectasis/ chronic bronchitis/ MAIC, complicated by hx of GERD, DM   Started 07/14/12- Biaxin 500mg  TIW, EMB 1200 TIW, Rifabutin 300 TIW  ended- 08/12/12 due to weakness and malaise    Has grandson with her today. FOLLOWS FOR: continues to have cough-same as last OV-productive-clear in color; needs RX for Tussionex as she does not recall getting printed Rx at last OV. Also, would like  RX for Tramadol. Dr Gladstone Lighter treated prednisone taper for sore ankles. Cough pattern has not changed, reflecting her known bronchiectasis. Denies fever, blood, chest pain, purulent sputum.  01/18/13- 87 yoF never smoker, followed for bronchiectasis/ chronic bronchitis/ MAIC, complicated by hx of GERD, DM   Started 07/14/12- Biaxin 500mg  TIW, EMB 1200 TIW, Rifabutin 300 TIW  ended- 08/12/12 due to weakness and malaise  FOLLOWS FOR: surgery clearance for back surgery if cleared. Would like Proair HFA refill sent to [facility] on file. Waiting on availability of extra strength flu vaccine this fall. Pending low back surgery because of chronic pain. Denies change in her chronic cough with scant clear sputum. No chest pain, palpitation, fever or blood. Dyspnea on exertion but mobility is limited more by her back pain so she gets little exercise. Still trying to maintain her own home. Daughter coming up to stay for a  while. Office spirometry  01/18/13- moderate obstructive airways disease-FVC 2.10/69%, FEV1 1.25/58%,  FEV1/FVC  0.59/ 84%,  FEF 25-75% 0.47/33%  ROS-see HPI Constitutional:     No-weight losst, night sweats, no-fevers, chills,  No-fatigue, lassitude. HEENT:   No-  headaches, difficulty swallowing, +tooth/dental problems, no-sore throat,       No-  sneezing, itching, ear ache, nasal  congestion, post nasal drip,  CV:  No-   chest pain, orthopnea, PND, swelling in lower extremities, anasarca,  dizziness, palpitations Resp: +Mild  shortness of breath with exertion or at rest.              +   productive cough,  + non-productive cough,  No- coughing up of blood.              No-   change in color of mucus.  No- wheezing.   Skin: No-   rash or lesions. GI:  No-   heartburn, indigestion, abdominal pain, nausea, vomiting,  GU:  MS:  +Active low back and bilateral hip pain Neuro-     complaint of generalized weakness without myalgias Psych:  No- change in mood or affect. No depression or anxiety.  No memory loss.  General- Alert, Oriented, Affect-appropriate, Distress- none acute. Looks well/neatly groomed. Skin- rash-none,  excoriation- none Lymphadenopathy- none Head- atraumatic            Eyes- Gross vision intact, PERRLA, conjunctivae clear secretions            Ears- Hearing, canals-normal            Nose- Clear, no-Septal dev, mucus, polyps, erosion, perforation             Throat- Mallampati II , mucosa very dry , drainage- none, tonsils- atrophic Neck- flexible , trachea midline, no stridor , thyroid nl, carotid no bruit Chest - symmetrical excursion , unlabored           Heart/CV- RRR , no murmur , no gallop  , no rub, nl s1 s2                           - JVD- none , edema- none, stasis changes- none, varices- none           Lung-  + working cough. + basilar crackles L>R  , unlabored, no wheeze , dullness-none, rub- none.  Chest wall-  Abd-  Br/ Gen/ Rectal- Not done, not indicated Extrem-   No-clubbing, none, atrophy- none, strength- nl for age.  Neuro- grossly intact to observation

## 2013-01-18 NOTE — Patient Instructions (Addendum)
Order- CXR   Dx bronchiectasis  Office Spirometry   Dx bronchiectasis

## 2013-01-19 DIAGNOSIS — Z23 Encounter for immunization: Secondary | ICD-10-CM | POA: Diagnosis not present

## 2013-01-19 NOTE — Progress Notes (Signed)
Quick Note:  Advised pt of cxr results per CY. Pt verbalized understanding and has no further questions at this time Will keep f/u appt ______

## 2013-01-21 DIAGNOSIS — C44621 Squamous cell carcinoma of skin of unspecified upper limb, including shoulder: Secondary | ICD-10-CM | POA: Diagnosis not present

## 2013-01-21 DIAGNOSIS — Z411 Encounter for cosmetic surgery: Secondary | ICD-10-CM | POA: Diagnosis not present

## 2013-01-21 DIAGNOSIS — D485 Neoplasm of uncertain behavior of skin: Secondary | ICD-10-CM | POA: Diagnosis not present

## 2013-01-26 ENCOUNTER — Other Ambulatory Visit: Payer: Self-pay | Admitting: Neurosurgery

## 2013-02-08 ENCOUNTER — Ambulatory Visit: Payer: Medicare Other | Admitting: Internal Medicine

## 2013-02-09 ENCOUNTER — Encounter (HOSPITAL_COMMUNITY): Payer: Self-pay

## 2013-02-09 ENCOUNTER — Encounter (HOSPITAL_COMMUNITY)
Admission: RE | Admit: 2013-02-09 | Discharge: 2013-02-09 | Disposition: A | Discharge status: Home / Self Care | Payer: Medicare Other | Source: Ambulatory Visit | Attending: Neurosurgery | Admitting: Neurosurgery

## 2013-02-09 DIAGNOSIS — Z01812 Encounter for preprocedural laboratory examination: Secondary | ICD-10-CM | POA: Diagnosis not present

## 2013-02-09 HISTORY — DX: Essential (primary) hypertension: I10

## 2013-02-09 HISTORY — DX: Malignant (primary) neoplasm, unspecified: C80.1

## 2013-02-09 HISTORY — DX: Dizziness and giddiness: R42

## 2013-02-09 HISTORY — DX: Chronic obstructive pulmonary disease, unspecified: J44.9

## 2013-02-09 HISTORY — DX: Pneumonia, unspecified organism: J18.9

## 2013-02-09 HISTORY — DX: Unspecified osteoarthritis, unspecified site: M19.90

## 2013-02-09 HISTORY — DX: Shortness of breath: R06.02

## 2013-02-09 LAB — BASIC METABOLIC PANEL
BUN: 13 mg/dL (ref 6–23)
CO2: 27 mEq/L (ref 19–32)
Chloride: 97 mEq/L (ref 96–112)
Creatinine, Ser: 0.67 mg/dL (ref 0.50–1.10)
GFR calc non Af Amer: 78 mL/min — ABNORMAL LOW (ref >90)

## 2013-02-09 LAB — CBC WITH DIFFERENTIAL/PLATELET
Basophils Absolute: 0.1 10*3/uL (ref 0.0–0.1)
Basophils Relative: 1 % (ref 0–1)
Eosinophils Relative: 3 % (ref 0–5)
HCT: 36.7 % (ref 36.0–46.0)
Lymphs Abs: 2.4 10*3/uL (ref 0.7–4.0)
MCH: 29.6 pg (ref 26.0–34.0)
MCHC: 33.5 g/dL (ref 30.0–36.0)
MCV: 88.2 fL (ref 78.0–100.0)
Monocytes Absolute: 0.9 10*3/uL (ref 0.1–1.0)
Platelets: 285 10*3/uL (ref 150–400)
RDW: 13.7 % (ref 11.5–15.5)
WBC: 7.3 10*3/uL (ref 4.0–10.5)

## 2013-02-09 LAB — URINALYSIS, ROUTINE W REFLEX MICROSCOPIC
Bilirubin Urine: NEGATIVE
Glucose, UA: NEGATIVE mg/dL
Hgb urine dipstick: NEGATIVE
Ketones, ur: NEGATIVE mg/dL
Nitrite: NEGATIVE
Urobilinogen, UA: 0.2 mg/dL (ref 0.0–1.0)

## 2013-02-09 LAB — SURGICAL PCR SCREEN
MRSA, PCR: NEGATIVE
Staphylococcus aureus: NEGATIVE

## 2013-02-09 LAB — APTT: aPTT: 34 seconds (ref 24–37)

## 2013-02-09 LAB — PROTIME-INR: INR: 1.06 (ref 0.00–1.49)

## 2013-02-09 NOTE — Pre-Procedure Instructions (Signed)
BRYNLEI KLAUSNER  02/09/2013   Your procedure is scheduled on:  Monday, October 27th  Report to Main Entrance "A" and check in with admitting at 0530 AM.  Call this number if you have problems the morning of surgery: [phone]   Remember:   Do not eat food or drink liquids after midnight.   Take these medicines the morning of surgery with A SIP OF WATER: nexium, inhalers, oxycodone if needed, xanax if needed  Do not take any diabetes medication on morning of surgery. Stop taking aspirin, over the counter vitamins/herbal medications, NSAIDS 5 days prior to surgery.   Do not wear jewelry, make-up or nail polish.  Do not wear lotions, powders, or perfumes. You may wear deodorant.  Do not shave 48 hours prior to surgery. Men may shave face and neck.  Do not bring valuables to the hospital.  [facility] is not responsible for any belongings or valuables.               Contacts, dentures or bridgework may not be worn into surgery.  Leave suitcase in the car. After surgery it may be brought to your room.  For patients admitted to the hospital, discharge time is determined by your   treatment team.    Special Instructions: Shower using CHG 2 nights before surgery and the night before surgery.  If you shower the day of surgery use CHG.  Use special wash - you have one bottle of CHG for all showers.  You should use approximately 1/3 of the bottle for each shower.   Please read over the following fact sheets that you were given: Pain Booklet, Coughing and Deep Breathing, MRSA Information and Surgical Site Infection Prevention

## 2013-02-09 NOTE — Progress Notes (Signed)
Primary physician - Dr. Catha Gosselin Does not have a cardiologist ekg in epic from April 2014. Sees dr. Maple Hudson for pulmonology - clearance on chart

## 2013-02-10 DIAGNOSIS — C44621 Squamous cell carcinoma of skin of unspecified upper limb, including shoulder: Secondary | ICD-10-CM | POA: Diagnosis not present

## 2013-02-12 ENCOUNTER — Telehealth: Payer: Self-pay | Admitting: Critical Care Medicine

## 2013-02-12 MED ORDER — ALBUTEROL SULFATE HFA 108 (90 BASE) MCG/ACT IN AERS: 2.0000 | INHALATION_SPRAY | Freq: Four times a day (QID) | RESPIRATORY_TRACT | Status: DC | PRN

## 2013-02-12 NOTE — Telephone Encounter (Signed)
Pt called for refill of her proair and this has been sent to the pharmacy.  She wanted to let CY know that she will be having her back surgery on Monday and would for CY to stop by and see her if at all possible.

## 2013-02-12 NOTE — Telephone Encounter (Signed)
noted 

## 2013-02-14 MED ORDER — CEFAZOLIN SODIUM-DEXTROSE 2-3 GM-% IV SOLR
2.0000 g | INTRAVENOUS | Status: DC
  Filled 2013-02-14: qty 50

## 2013-02-14 NOTE — Anesthesia Preprocedure Evaluation (Addendum)
Anesthesia Evaluation  Patient identified by MRN, date of birth, ID band Patient awake    Reviewed: Allergy & Precautions, H&P , NPO status , Patient's Chart, lab work & pertinent test results  History of Anesthesia Complications Negative for: history of anesthetic complications  Airway Mallampati: II TM Distance: >3 FB Neck ROM: Full    Dental  (+) Teeth Intact and Dental Advisory Given   Pulmonary shortness of breath and with exertion, pneumonia -, resolved, COPD COPD inhaler,  + rhonchi         Cardiovascular hypertension, Pt. on medications Rhythm:Regular Rate:Normal     Neuro/Psych Chronic LBP    GI/Hepatic GERD-  Medicated and Controlled,  Endo/Other  diabetes, Type 2  Renal/GU Hx of UTI. Currently c/o pain with urinating     Musculoskeletal   Abdominal   Peds  Hematology   Anesthesia Other Findings Generalized weakness  Reproductive/Obstetrics                          Anesthesia Physical Anesthesia Plan  ASA: III  Anesthesia Plan: General   Post-op Pain Management:    Induction: Intravenous  Airway Management Planned: Oral ETT  Additional Equipment:   Intra-op Plan:   Post-operative Plan: Extubation in OR  Informed Consent: I have reviewed the patients History and Physical, chart, labs and discussed the procedure including the risks, benefits and alternatives for the proposed anesthesia with the patient or authorized representative who has indicated his/her understanding and acceptance.     Plan Discussed with: CRNA and Surgeon  Anesthesia Plan Comments:         Anesthesia Quick Evaluation

## 2013-02-15 ENCOUNTER — Inpatient Hospital Stay (HOSPITAL_COMMUNITY)
Admission: RE | Admit: 2013-02-15 | Discharge: 2013-02-16 | DRG: 520 | Disposition: A | Discharge status: Home / Self Care | Payer: Medicare Other | Source: Ambulatory Visit | Attending: Neurosurgery | Admitting: Neurosurgery

## 2013-02-15 ENCOUNTER — Encounter (HOSPITAL_COMMUNITY)
Admission: RE | Disposition: A | Discharge status: Home / Self Care | Payer: Self-pay | Source: Ambulatory Visit | Attending: Neurosurgery

## 2013-02-15 ENCOUNTER — Encounter (HOSPITAL_COMMUNITY): Payer: Medicare Other | Admitting: Anesthesiology

## 2013-02-15 ENCOUNTER — Inpatient Hospital Stay (HOSPITAL_COMMUNITY): Payer: Medicare Other

## 2013-02-15 ENCOUNTER — Encounter (HOSPITAL_COMMUNITY): Payer: Self-pay | Admitting: *Deleted

## 2013-02-15 ENCOUNTER — Inpatient Hospital Stay (HOSPITAL_COMMUNITY): Payer: Medicare Other | Admitting: Anesthesiology

## 2013-02-15 DIAGNOSIS — R0602 Shortness of breath: Secondary | ICD-10-CM | POA: Diagnosis not present

## 2013-02-15 DIAGNOSIS — M519 Unspecified thoracic, thoracolumbar and lumbosacral intervertebral disc disorder: Secondary | ICD-10-CM | POA: Diagnosis not present

## 2013-02-15 DIAGNOSIS — E119 Type 2 diabetes mellitus without complications: Secondary | ICD-10-CM | POA: Diagnosis not present

## 2013-02-15 DIAGNOSIS — J4489 Other specified chronic obstructive pulmonary disease: Secondary | ICD-10-CM | POA: Diagnosis present

## 2013-02-15 DIAGNOSIS — M48061 Spinal stenosis, lumbar region without neurogenic claudication: Principal | ICD-10-CM | POA: Diagnosis present

## 2013-02-15 DIAGNOSIS — M7130 Other bursal cyst, unspecified site: Secondary | ICD-10-CM | POA: Diagnosis present

## 2013-02-15 DIAGNOSIS — I10 Essential (primary) hypertension: Secondary | ICD-10-CM | POA: Diagnosis present

## 2013-02-15 DIAGNOSIS — Z79899 Other long term (current) drug therapy: Secondary | ICD-10-CM | POA: Diagnosis not present

## 2013-02-15 DIAGNOSIS — J449 Chronic obstructive pulmonary disease, unspecified: Secondary | ICD-10-CM | POA: Diagnosis not present

## 2013-02-15 DIAGNOSIS — G8929 Other chronic pain: Secondary | ICD-10-CM | POA: Diagnosis present

## 2013-02-15 DIAGNOSIS — Z7982 Long term (current) use of aspirin: Secondary | ICD-10-CM

## 2013-02-15 DIAGNOSIS — K219 Gastro-esophageal reflux disease without esophagitis: Secondary | ICD-10-CM | POA: Diagnosis present

## 2013-02-15 HISTORY — PX: LUMBAR LAMINECTOMY/DECOMPRESSION MICRODISCECTOMY: SHX5026

## 2013-02-15 LAB — GLUCOSE, CAPILLARY
Glucose-Capillary: 111 mg/dL — ABNORMAL HIGH (ref 70–99)
Glucose-Capillary: 134 mg/dL — ABNORMAL HIGH (ref 70–99)
Glucose-Capillary: 128 mg/dL — ABNORMAL HIGH (ref 70–99)

## 2013-02-15 SURGERY — LUMBAR LAMINECTOMY/DECOMPRESSION MICRODISCECTOMY 1 LEVEL
Anesthesia: General | Wound class: Clean

## 2013-02-15 MED ORDER — HYDROCODONE-ACETAMINOPHEN 5-325 MG PO TABS: 1.0000 | ORAL_TABLET | ORAL | Status: DC | PRN

## 2013-02-15 MED ORDER — PANTOPRAZOLE SODIUM 40 MG PO TBEC
40.0000 mg | DELAYED_RELEASE_TABLET | Freq: Every day | ORAL | Status: DC
  Administered 2013-02-16: 40 mg via ORAL
  Filled 2013-02-15: qty 1

## 2013-02-15 MED ORDER — ONDANSETRON HCL 4 MG/2ML IJ SOLN: 4.0000 mg | INTRAMUSCULAR | Status: DC | PRN

## 2013-02-15 MED ORDER — OXYCODONE-ACETAMINOPHEN 5-325 MG PO TABS
1.0000 | ORAL_TABLET | ORAL | Status: DC | PRN
  Administered 2013-02-15 (×3): 1 via ORAL
  Administered 2013-02-16: 2 via ORAL
  Filled 2013-02-15 (×2): qty 1
  Filled 2013-02-15: qty 2
  Filled 2013-02-15: qty 1

## 2013-02-15 MED ORDER — FENTANYL CITRATE 0.05 MG/ML IJ SOLN
25.0000 ug | INTRAMUSCULAR | Status: DC | PRN
  Administered 2013-02-15: 50 ug via INTRAVENOUS
  Administered 2013-02-15 (×2): 25 ug via INTRAVENOUS

## 2013-02-15 MED ORDER — CEFAZOLIN SODIUM-DEXTROSE 2-3 GM-% IV SOLR
INTRAVENOUS | Status: AC
  Administered 2013-02-15: 2 g via INTRAVENOUS
  Filled 2013-02-15: qty 50

## 2013-02-15 MED ORDER — PROPOFOL 10 MG/ML IV BOLUS
INTRAVENOUS | Status: DC | PRN
  Administered 2013-02-15: 100 mg via INTRAVENOUS

## 2013-02-15 MED ORDER — ALPRAZOLAM 0.25 MG PO TABS
0.2500 mg | ORAL_TABLET | Freq: Three times a day (TID) | ORAL | Status: DC | PRN
  Administered 2013-02-15: 0.25 mg via ORAL
  Filled 2013-02-15: qty 1

## 2013-02-15 MED ORDER — ALBUTEROL SULFATE HFA 108 (90 BASE) MCG/ACT IN AERS
2.0000 | INHALATION_SPRAY | Freq: Four times a day (QID) | RESPIRATORY_TRACT | Status: DC | PRN
  Filled 2013-02-15: qty 6.7

## 2013-02-15 MED ORDER — LOSARTAN POTASSIUM 25 MG PO TABS
12.5000 mg | ORAL_TABLET | Freq: Every morning | ORAL | Status: DC
  Administered 2013-02-15 – 2013-02-16 (×2): 12.5 mg via ORAL
  Filled 2013-02-15 (×2): qty 0.5

## 2013-02-15 MED ORDER — CYCLOBENZAPRINE HCL 10 MG PO TABS
10.0000 mg | ORAL_TABLET | Freq: Three times a day (TID) | ORAL | Status: DC | PRN
  Administered 2013-02-15: 10 mg via ORAL
  Filled 2013-02-15: qty 1

## 2013-02-15 MED ORDER — INSULIN ASPART 100 UNIT/ML ~~LOC~~ SOLN: 0.0000 [IU] | Freq: Three times a day (TID) | SUBCUTANEOUS | Status: DC

## 2013-02-15 MED ORDER — IPRATROPIUM BROMIDE 0.02 % IN SOLN: 500.0000 ug | Freq: Four times a day (QID) | RESPIRATORY_TRACT | Status: DC | PRN

## 2013-02-15 MED ORDER — EPHEDRINE SULFATE 50 MG/ML IJ SOLN
INTRAMUSCULAR | Status: DC | PRN
  Administered 2013-02-15: 5 mg via INTRAVENOUS
  Administered 2013-02-15: 10 mg via INTRAVENOUS

## 2013-02-15 MED ORDER — 0.9 % SODIUM CHLORIDE (POUR BTL) OPTIME
TOPICAL | Status: DC | PRN
  Administered 2013-02-15: 1000 mL

## 2013-02-15 MED ORDER — ROCURONIUM BROMIDE 100 MG/10ML IV SOLN
INTRAVENOUS | Status: DC | PRN
  Administered 2013-02-15: 40 mg via INTRAVENOUS

## 2013-02-15 MED ORDER — GLYCOPYRROLATE 0.2 MG/ML IJ SOLN
INTRAMUSCULAR | Status: DC | PRN
  Administered 2013-02-15: 0.4 mg via INTRAVENOUS

## 2013-02-15 MED ORDER — TRIAMTERENE-HCTZ 75-50 MG PO TABS
0.5000 | ORAL_TABLET | Freq: Every morning | ORAL | Status: DC
  Administered 2013-02-15 – 2013-02-16 (×2): 0.5 via ORAL
  Filled 2013-02-15 (×2): qty 0.5

## 2013-02-15 MED ORDER — TRAMADOL HCL 50 MG PO TABS: 50.0000 mg | ORAL_TABLET | Freq: Four times a day (QID) | ORAL | Status: DC | PRN

## 2013-02-15 MED ORDER — MIDAZOLAM HCL 2 MG/2ML IJ SOLN: 1.0000 mg | INTRAMUSCULAR | Status: DC | PRN

## 2013-02-15 MED ORDER — METFORMIN HCL 500 MG PO TABS
750.0000 mg | ORAL_TABLET | Freq: Two times a day (BID) | ORAL | Status: DC
  Administered 2013-02-15 – 2013-02-16 (×2): 750 mg via ORAL
  Filled 2013-02-15 (×4): qty 2

## 2013-02-15 MED ORDER — SODIUM CHLORIDE 0.9 % IJ SOLN
3.0000 mL | Freq: Two times a day (BID) | INTRAMUSCULAR | Status: DC
  Administered 2013-02-15 (×2): 3 mL via INTRAVENOUS

## 2013-02-15 MED ORDER — SODIUM CHLORIDE 0.9 % IR SOLN
Status: DC | PRN
  Administered 2013-02-15: 07:00:00

## 2013-02-15 MED ORDER — LIDOCAINE HCL (CARDIAC) 20 MG/ML IV SOLN
INTRAVENOUS | Status: DC | PRN
  Administered 2013-02-15: 70 mg via INTRAVENOUS

## 2013-02-15 MED ORDER — PROMETHAZINE HCL 25 MG/ML IJ SOLN: 12.5000 mg | INTRAMUSCULAR | Status: DC | PRN

## 2013-02-15 MED ORDER — ARTIFICIAL TEARS OP OINT
TOPICAL_OINTMENT | OPHTHALMIC | Status: DC | PRN
  Administered 2013-02-15: 1 via OPHTHALMIC

## 2013-02-15 MED ORDER — PHENYLEPHRINE HCL 10 MG/ML IJ SOLN
INTRAMUSCULAR | Status: DC | PRN
  Administered 2013-02-15 (×4): 80 ug via INTRAVENOUS
  Administered 2013-02-15 (×2): 40 ug via INTRAVENOUS

## 2013-02-15 MED ORDER — BISACODYL 10 MG RE SUPP: 10.0000 mg | Freq: Every day | RECTAL | Status: DC | PRN

## 2013-02-15 MED ORDER — METHOCARBAMOL 500 MG PO TABS
ORAL_TABLET | ORAL | Status: AC
  Filled 2013-02-15: qty 1

## 2013-02-15 MED ORDER — INSULIN ASPART 100 UNIT/ML ~~LOC~~ SOLN
0.0000 [IU] | SUBCUTANEOUS | Status: DC
Start: 2013-02-15 — End: 2013-02-15

## 2013-02-15 MED ORDER — ALBUTEROL SULFATE (5 MG/ML) 0.5% IN NEBU
INHALATION_SOLUTION | RESPIRATORY_TRACT | Status: AC
  Administered 2013-02-15: 2.5 mg via RESPIRATORY_TRACT
  Filled 2013-02-15: qty 0.5

## 2013-02-15 MED ORDER — HYDROCOD POLST-CHLORPHEN POLST 10-8 MG/5ML PO LQCR: 1.2500 mL | Freq: Two times a day (BID) | ORAL | Status: DC | PRN

## 2013-02-15 MED ORDER — LIDOCAINE-EPINEPHRINE 1 %-1:100000 IJ SOLN
INTRAMUSCULAR | Status: DC | PRN
  Administered 2013-02-15: 20 mL

## 2013-02-15 MED ORDER — HEMOSTATIC AGENTS (NO CHARGE) OPTIME
TOPICAL | Status: DC | PRN
  Administered 2013-02-15: 1 via TOPICAL

## 2013-02-15 MED ORDER — CEFAZOLIN SODIUM 1-5 GM-% IV SOLN
1.0000 g | Freq: Three times a day (TID) | INTRAVENOUS | Status: AC
  Administered 2013-02-15 (×2): 1 g via INTRAVENOUS
  Filled 2013-02-15 (×2): qty 50

## 2013-02-15 MED ORDER — ACETAMINOPHEN 325 MG PO TABS: 650.0000 mg | ORAL_TABLET | ORAL | Status: DC | PRN

## 2013-02-15 MED ORDER — FUROSEMIDE 20 MG PO TABS
20.0000 mg | ORAL_TABLET | Freq: Every day | ORAL | Status: DC
  Administered 2013-02-16: 20 mg via ORAL
  Filled 2013-02-15 (×2): qty 1

## 2013-02-15 MED ORDER — DOCUSATE SODIUM 100 MG PO CAPS
100.0000 mg | ORAL_CAPSULE | Freq: Two times a day (BID) | ORAL | Status: DC
  Administered 2013-02-15 – 2013-02-16 (×3): 100 mg via ORAL
  Filled 2013-02-15 (×3): qty 1

## 2013-02-15 MED ORDER — MAGNESIUM HYDROXIDE 400 MG/5ML PO SUSP: 30.0000 mL | Freq: Every day | ORAL | Status: DC | PRN

## 2013-02-15 MED ORDER — DEXAMETHASONE SODIUM PHOSPHATE 4 MG/ML IJ SOLN
INTRAMUSCULAR | Status: DC | PRN
  Administered 2013-02-15: 4 mg via INTRAVENOUS

## 2013-02-15 MED ORDER — BUDESONIDE 0.25 MG/2ML IN SUSP
0.2500 mg | Freq: Every day | RESPIRATORY_TRACT | Status: DC
  Filled 2013-02-15 (×2): qty 2

## 2013-02-15 MED ORDER — MORPHINE SULFATE 2 MG/ML IJ SOLN
1.0000 mg | INTRAMUSCULAR | Status: DC | PRN
  Administered 2013-02-15 (×3): 2 mg via INTRAVENOUS
  Filled 2013-02-15 (×3): qty 1

## 2013-02-15 MED ORDER — FENTANYL CITRATE 0.05 MG/ML IJ SOLN: 50.0000 ug | Freq: Once | INTRAMUSCULAR | Status: DC

## 2013-02-15 MED ORDER — METHOCARBAMOL 500 MG PO TABS
500.0000 mg | ORAL_TABLET | Freq: Four times a day (QID) | ORAL | Status: DC | PRN
  Administered 2013-02-15: 500 mg via ORAL

## 2013-02-15 MED ORDER — NEOSTIGMINE METHYLSULFATE 1 MG/ML IJ SOLN
INTRAMUSCULAR | Status: DC | PRN
  Administered 2013-02-15: 3 mg via INTRAVENOUS

## 2013-02-15 MED ORDER — PROMETHAZINE HCL 12.5 MG PO TABS
12.5000 mg | ORAL_TABLET | ORAL | Status: DC | PRN
  Filled 2013-02-15: qty 2

## 2013-02-15 MED ORDER — METHOCARBAMOL 100 MG/ML IJ SOLN
500.0000 mg | Freq: Four times a day (QID) | INTRAVENOUS | Status: DC | PRN
  Filled 2013-02-15: qty 5

## 2013-02-15 MED ORDER — SODIUM CHLORIDE 0.9 % IJ SOLN: 3.0000 mL | INTRAMUSCULAR | Status: DC | PRN

## 2013-02-15 MED ORDER — FENTANYL CITRATE 0.05 MG/ML IJ SOLN
INTRAMUSCULAR | Status: DC | PRN
  Administered 2013-02-15: 25 ug via INTRAVENOUS
  Administered 2013-02-15: 75 ug via INTRAVENOUS

## 2013-02-15 MED ORDER — ONDANSETRON HCL 4 MG/2ML IJ SOLN
INTRAMUSCULAR | Status: DC | PRN
  Administered 2013-02-15: 4 mg via INTRAVENOUS

## 2013-02-15 MED ORDER — ACETAMINOPHEN 650 MG RE SUPP: 650.0000 mg | RECTAL | Status: DC | PRN

## 2013-02-15 MED ORDER — LACTATED RINGERS IV SOLN: INTRAVENOUS | Status: DC

## 2013-02-15 MED ORDER — ALBUTEROL SULFATE (5 MG/ML) 0.5% IN NEBU
2.5000 mg | INHALATION_SOLUTION | Freq: Once | RESPIRATORY_TRACT | Status: AC
  Administered 2013-02-15: 2.5 mg via RESPIRATORY_TRACT
  Filled 2013-02-15: qty 0.5

## 2013-02-15 MED ORDER — FENTANYL CITRATE 0.05 MG/ML IJ SOLN
INTRAMUSCULAR | Status: AC
  Filled 2013-02-15: qty 2

## 2013-02-15 MED ORDER — VITAMIN D3 25 MCG (1000 UNIT) PO TABS
2000.0000 [IU] | ORAL_TABLET | Freq: Every morning | ORAL | Status: DC
  Filled 2013-02-15 (×2): qty 2

## 2013-02-15 MED ORDER — LACTATED RINGERS IV SOLN
INTRAVENOUS | Status: DC | PRN
  Administered 2013-02-15 (×2): via INTRAVENOUS

## 2013-02-15 MED ORDER — THROMBIN 5000 UNITS EX SOLR
CUTANEOUS | Status: DC | PRN
  Administered 2013-02-15 (×2): 5000 [IU] via TOPICAL

## 2013-02-15 SURGICAL SUPPLY — 49 items
BAG DECANTER FOR FLEXI CONT (MISCELLANEOUS) ×2 IMPLANT
BENZOIN TINCTURE PRP APPL 2/3 (GAUZE/BANDAGES/DRESSINGS) ×2 IMPLANT
BLADE SURG ROTATE 9660 (MISCELLANEOUS) IMPLANT
BUR ROUND FLUTED 5 RND (BURR) ×2 IMPLANT
CANISTER SUCT 3000ML (MISCELLANEOUS) ×2 IMPLANT
CONT SPEC 4OZ CLIKSEAL STRL BL (MISCELLANEOUS) ×2 IMPLANT
DRAPE LAPAROTOMY 100X72X124 (DRAPES) ×2 IMPLANT
DRAPE MICROSCOPE LEICA (MISCELLANEOUS) ×2 IMPLANT
DRAPE POUCH INSTRU U-SHP 10X18 (DRAPES) ×2 IMPLANT
DRAPE SURG 17X23 STRL (DRAPES) ×2 IMPLANT
DRESSING TELFA 8X3 (GAUZE/BANDAGES/DRESSINGS) IMPLANT
DURAPREP 26ML APPLICATOR (WOUND CARE) ×2 IMPLANT
ELECT REM PT RETURN 9FT ADLT (ELECTROSURGICAL) ×2
ELECTRODE REM PT RTRN 9FT ADLT (ELECTROSURGICAL) ×1 IMPLANT
GAUZE SPONGE 4X4 16PLY XRAY LF (GAUZE/BANDAGES/DRESSINGS) IMPLANT
GLOVE ECLIPSE 7.5 STRL STRAW (GLOVE) ×2 IMPLANT
GLOVE ECLIPSE 8.5 STRL (GLOVE) ×2 IMPLANT
GLOVE EXAM NITRILE LRG STRL (GLOVE) ×2 IMPLANT
GLOVE EXAM NITRILE MD LF STRL (GLOVE) IMPLANT
GLOVE EXAM NITRILE XL STR (GLOVE) IMPLANT
GLOVE EXAM NITRILE XS STR PU (GLOVE) IMPLANT
GLOVE INDICATOR 7.5 STRL GRN (GLOVE) ×2 IMPLANT
GLOVE SURG SS PI 7.0 STRL IVOR (GLOVE) ×4 IMPLANT
GOWN BRE IMP SLV AUR LG STRL (GOWN DISPOSABLE) ×2 IMPLANT
GOWN BRE IMP SLV AUR XL STRL (GOWN DISPOSABLE) ×2 IMPLANT
GOWN STRL REIN 2XL LVL4 (GOWN DISPOSABLE) IMPLANT
KIT BASIN OR (CUSTOM PROCEDURE TRAY) ×2 IMPLANT
KIT ROOM TURNOVER OR (KITS) ×2 IMPLANT
NEEDLE HYPO 18GX1.5 BLUNT FILL (NEEDLE) IMPLANT
NEEDLE HYPO 22GX1.5 SAFETY (NEEDLE) ×4 IMPLANT
NS IRRIG 1000ML POUR BTL (IV SOLUTION) ×2 IMPLANT
PACK LAMINECTOMY NEURO (CUSTOM PROCEDURE TRAY) ×2 IMPLANT
PAD ARMBOARD 7.5X6 YLW CONV (MISCELLANEOUS) ×6 IMPLANT
PATTIES SURGICAL .75X.75 (GAUZE/BANDAGES/DRESSINGS) ×2 IMPLANT
RUBBERBAND STERILE (MISCELLANEOUS) ×4 IMPLANT
SPONGE GAUZE 4X4 12PLY (GAUZE/BANDAGES/DRESSINGS) ×2 IMPLANT
SPONGE LAP 4X18 X RAY DECT (DISPOSABLE) IMPLANT
SPONGE SURGIFOAM ABS GEL SZ50 (HEMOSTASIS) ×2 IMPLANT
STRIP CLOSURE SKIN 1/2X4 (GAUZE/BANDAGES/DRESSINGS) ×2 IMPLANT
SUT PROLENE 6 0 BV (SUTURE) IMPLANT
SUT VIC AB 0 CT1 18XCR BRD8 (SUTURE) ×1 IMPLANT
SUT VIC AB 0 CT1 8-18 (SUTURE) ×1
SUT VIC AB 2-0 CP2 18 (SUTURE) ×2 IMPLANT
SUT VIC AB 3-0 SH 8-18 (SUTURE) ×2 IMPLANT
SYR 20ML ECCENTRIC (SYRINGE) ×2 IMPLANT
SYR 5ML LL (SYRINGE) IMPLANT
TOWEL OR 17X24 6PK STRL BLUE (TOWEL DISPOSABLE) ×2 IMPLANT
TOWEL OR 17X26 10 PK STRL BLUE (TOWEL DISPOSABLE) ×2 IMPLANT
WATER STERILE IRR 1000ML POUR (IV SOLUTION) ×2 IMPLANT

## 2013-02-15 NOTE — Interval H&P Note (Signed)
History and Physical Interval Note:  02/15/2013 7:37 AM  Katie Alvarado  has presented today for surgery, with the diagnosis of Lumbar stenosis  The various methods of treatment have been discussed with the patient and family. After consideration of risks, benefits and other options for treatment, the patient has consented to  Procedure(s) with comments: LUMBAR LAMINECTOMY/DECOMPRESSION MICRODISCECTOMY 1 LEVEL (N/A) - L4-5 Laminectomy as a surgical intervention .  The patient's history has been reviewed, patient examined, no change in status, stable for surgery.  I have reviewed the patient's chart and labs.  Questions were answered to the patient's satisfaction.     Caryl Manas R

## 2013-02-15 NOTE — H&P (Signed)
See H& P.

## 2013-02-15 NOTE — Progress Notes (Signed)
UR COMPLETED  

## 2013-02-15 NOTE — Anesthesia Procedure Notes (Signed)
Procedure Name: Intubation Date/Time: 02/15/2013 7:53 AM Performed by: Orvilla Fus A Pre-anesthesia Checklist: Patient identified, Timeout performed, Emergency Drugs available, Suction available and Patient being monitored Patient Re-evaluated:Patient Re-evaluated prior to inductionOxygen Delivery Method: Circle system utilized Preoxygenation: Pre-oxygenation with 100% oxygen Intubation Type: IV induction Ventilation: Mask ventilation without difficulty Laryngoscope Size: Mac and 3 Grade View: Grade I Tube type: Oral Tube size: 7.0 mm Number of attempts: 1 Airway Equipment and Method: Stylet Placement Confirmation: ETT inserted through vocal cords under direct vision,  breath sounds checked- equal and bilateral and positive ETCO2 Secured at: 21 cm Tube secured with: Tape Dental Injury: Teeth and Oropharynx as per pre-operative assessment

## 2013-02-15 NOTE — Progress Notes (Signed)
Noted increased coughing. Patient reports that she is feeling like she needs a breathing treatment. Dr. Gypsy Balsam notified orders received.

## 2013-02-15 NOTE — Anesthesia Postprocedure Evaluation (Signed)
  Anesthesia Post-op Note  Patient: Katie Alvarado  Procedure(s) Performed: Procedure(s): LUMBAR LAMINECTOMY/DECOMPRESSION MICRODISCECTOMY LUMBAR FOUR-FIVE (N/A)  Patient Location: PACU  Anesthesia Type:General  Level of Consciousness: awake  Airway and Oxygen Therapy: Patient Spontanous Breathing  Post-op Pain: mild  Post-op Assessment: Post-op Vital signs reviewed, Patient's Cardiovascular Status Stable, Respiratory Function Stable, Patent Airway, No signs of Nausea or vomiting and Pain level controlled  Post-op Vital Signs: Reviewed and stable  Complications: No apparent anesthesia complications

## 2013-02-15 NOTE — Progress Notes (Signed)
Patient reported that she is feeling better post breathing treatment. Noticed less coughing post treatment.

## 2013-02-15 NOTE — Op Note (Signed)
02/15/2013  9:53 AM  PATIENT:  Katie Alvarado  79 y.o. female  PRE-OPERATIVE DIAGNOSIS:  Lumbar stenosis  POST-OPERATIVE DIAGNOSIS:  Lumbar stenosis  PROCEDURE:  Procedure(s): LUMBAR LAMINECTOMY/DECOMPRESSION , decompressing the bilateral L4 and L5 roots  - (2 levels)  - microdisection  SURGEON:  Surgeon(s): Clydene Fake, MD Temple Pacini, MD-assist    ANESTHESIA:   general  EBL:  Total I/O In: 1000 [I.V.:1000] Out: 25 [Blood:25]  BLOOD ADMINISTERED:none  DRAINS: none   SPECIMEN:  No Specimen  DICTATION: Patient is an 79 year old woman severe back pain worse when standing or to try to from when tested to the dura and we very carefully dissected up to the is a synovial cyst removing that and the hypertrophic ligament decompressing the central canal and both of 4 and V nerve root. has to sit down after just a few yards because the legs to feel weak and rubbery. Myelogram was done showing severe stenosis at the L4-5 level and after discussion of the options patient elected to proceed with surgical decompression.  Patient brought into operating room general anesthesia induced patient placed in prone position Wilson frame all pressure points padded. Patient prepped draped sterile fashion 7 incision injected with 20 cc 1% lidocaine. Needle was placed in interspace x-rays obtained showing the needle was putting at the four-part space incision was then made centered with needle was incision taken of the fascia hemostasis obtained with Bovie cauterization fascia was incised and subperiosteal dissection over the L4 and 5 spinous process lamina to the facet bilaterally cementing retractors placed markers placed in interspace another x-rays obtained confirming or positioning at L4-5.  High-speed drill was then used to starting semi-hemi-laminectomy medial facetectomy on the left side and this was repeated on the right spinous process and ligament intact in the middle to try to keep some  stability.  Microscope was brought in for microdissection this for this and we continued to the decompressive laminectomy with Kerrison punches and a curettes. The left-sided a good decompression central canal and make sure the L4 and L5 roots were well decompressed and the right side same technique was done the recess and canal and the 4 and 5 roots though inferiorly was a synovial cyst that we carefully dissected off the dura mixture fiber was decompressed we were finished we did decompression of the central canal and the L4 and 5 roots bilaterally. We. About solution. Hemostasis retractors removed fascia closed with 0 Vicryl interrupted sutures of his tissue closed with 02 over 0 Vicryl interrupted sutures skin closed benzoin Steri-Strips dressing was placed patient placed back in spine position woken (and transferred recovery.   PLAN OF CARE: Admit to inpatient   PATIENT DISPOSITION:  PACU - hemodynamically stable.

## 2013-02-15 NOTE — Plan of Care (Signed)
Problem: Consults Goal: Diagnosis - Spinal Surgery Outcome: Completed/Met Date Met:  02/15/13 Lumbar Laminectomy (Complex)     

## 2013-02-15 NOTE — Transfer of Care (Signed)
Immediate Anesthesia Transfer of Care Note  Patient: Katie Alvarado  Procedure(s) Performed: Procedure(s): LUMBAR LAMINECTOMY/DECOMPRESSION MICRODISCECTOMY LUMBAR FOUR-FIVE (N/A)  Patient Location: PACU  Anesthesia Type:General  Level of Consciousness: awake and alert   Airway & Oxygen Therapy: Patient Spontanous Breathing and Patient connected to nasal cannula oxygen  Post-op Assessment: Report given to PACU RN, Post -op Vital signs reviewed and stable and Patient moving all extremities  Post vital signs: Reviewed and stable  Complications: No apparent anesthesia complications

## 2013-02-15 NOTE — Preoperative (Signed)
Beta Blockers   Reason not to administer Beta Blockers:Not Applicable 

## 2013-02-16 ENCOUNTER — Encounter (HOSPITAL_COMMUNITY): Payer: Self-pay | Admitting: Neurosurgery

## 2013-02-16 LAB — GLUCOSE, CAPILLARY: Glucose-Capillary: 89 mg/dL (ref 70–99)

## 2013-02-16 MED ORDER — CYCLOBENZAPRINE HCL 10 MG PO TABS: 10.0000 mg | ORAL_TABLET | Freq: Three times a day (TID) | ORAL | Status: DC | PRN

## 2013-02-16 MED ORDER — OXYCODONE-ACETAMINOPHEN 5-325 MG PO TABS: 1.0000 | ORAL_TABLET | ORAL | Status: DC | PRN

## 2013-02-16 NOTE — Evaluation (Addendum)
Physical Therapy Evaluation Patient Details Name: Katie Alvarado MRN: [id number] DOB: August 05, 1925 Today's Date: 02/16/2013 Time: 0902-0920 PT Time Calculation (min): 18 min  PT Assessment / Plan / Recommendation History of Present Illness  LUMBAR LAMINECTOMY/DECOMPRESSION , decompressing the bilateral L4 and L5 roots  - (2 levels)  - microdisection  Clinical Impression  Pt doing very well after surgery and ready for dc home from PT standpoint. Instructed pt to continue incr amb at home.  Also answered questions from pt and daughter about what types of surfaces to sit on and how to get in/out of high bed.    PT Assessment  Patent does not need any further PT services    Follow Up Recommendations  No PT follow up    Does the patient have the potential to tolerate intense rehabilitation      Barriers to Discharge        Equipment Recommendations  None recommended by PT    Recommendations for Other Services     Frequency      Precautions / Restrictions Precautions Precautions: Back   Pertinent Vitals/Pain Incisional pain.      Mobility  Bed Mobility Bed Mobility: Sit to Sidelying Right Sit to Sidelying Right: 6: Modified independent (Device/Increase time);With rail Details for Bed Mobility Assistance: incr time Transfers Transfers: Sit to Stand;Stand to Sit Sit to Stand: 6: Modified independent (Device/Increase time);With upper extremity assist;From toilet Stand to Sit: 6: Modified independent (Device/Increase time);With upper extremity assist;To bed Ambulation/Gait Ambulation/Gait Assistance: 6: Modified independent (Device/Increase time) Ambulation Distance (Feet): 150 Feet Assistive device: None Gait Pattern: Step-through pattern;Decreased stride length Gait velocity: decr Stairs: Yes Stairs Assistance: 6: Modified independent (Device/Increase time) Stair Management Technique: One rail Right;Step to pattern Number of Stairs: 4    Exercises     PT Diagnosis:     PT Problem List:   PT Treatment Interventions:       PT Goals(Current goals can be found in the care plan section) Acute Rehab PT Goals PT Goal Formulation: No goals set, d/c therapy  Visit Information  Last PT Received On: 02/16/13 Assistance Needed: +1 History of Present Illness: LUMBAR LAMINECTOMY/DECOMPRESSION , decompressing the bilateral L4 and L5 roots  - (2 levels)  - microdisection       Prior Functioning  Home Living Family/patient expects to be discharged to:: Private residence Living Arrangements: Alone Available Help at Discharge:  (daughter from [place] here initially.) Type of Home: House Home Access: Ramped entrance Home Layout: One level Home Equipment: Bedside commode;Walker - 4 wheels;Walker - 2 wheels;Cane - single point;Shower seat Prior Function Level of Independence: Independent;Independent with assistive device(s) Comments: cane outside Communication Communication: No difficulties    Cognition  Cognition Arousal/Alertness: Awake/alert Behavior During Therapy: WFL for tasks assessed/performed Overall Cognitive Status: Within Functional Limits for tasks assessed    Extremity/Trunk Assessment Upper Extremity Assessment Upper Extremity Assessment: Defer to OT evaluation Lower Extremity Assessment Lower Extremity Assessment: Overall WFL for tasks assessed   Balance Balance Balance Assessed: Yes Static Standing Balance Static Standing - Balance Support: No upper extremity supported Static Standing - Level of Assistance: 6: Modified independent (Device/Increase time)  End of Session PT - End of Session Activity Tolerance: Patient tolerated treatment well Patient left: in bed;with call bell/phone within reach;with family/visitor present Nurse Communication: Mobility status  GP     [facility] 02/16/2013, 9:39 AM  [facility] PT [phone]

## 2013-02-16 NOTE — Progress Notes (Signed)
Pt. Alert and oriented,follows simple instructions, denies pain. Incision area without swelling, redness or S/S of infection. Voiding adequate clear yellow urine. Moving all extremities well and vitals stable and documented. Lumbar surgery notes instructions given to patient and family member for home safety and precautions. Pt. and family stated understanding of instructions given.  

## 2013-02-16 NOTE — Evaluation (Signed)
Occupational Therapy Evaluation Patient Details Name: Katie Alvarado MRN: [id number] DOB: [birth year] Today's Date: 02/16/2013 Time: 8295-6213 OT Time Calculation (min): 20 min  OT Assessment / Plan / Recommendation History of present illness LUMBAR LAMINECTOMY/DECOMPRESSION , decompressing the bilateral L4 and L5 roots  - (2 levels)  - microdisection   Clinical Impression   Pt overall at a min guard assist to min assist level for selfcare tasks and functional transfers adhering to the back precautions.  She will have initial 24 hour supervision from her daughter.  Have educated both pt and daughter on use of DME for toileting as well as AE available to increase independence with LB selfcare.  Also discussed the need for her to initially use her walker at home as well.  No further acute or post acute OT needs.      OT Assessment  Patient does not need any further OT services    Follow Up Recommendations  No OT follow up       Equipment Recommendations  None recommended by OT          Precautions / Restrictions Precautions Precautions: Back Precaution Comments: pt given handout with general back precautions Restrictions Weight Bearing Restrictions: No   Pertinent Vitals/Pain Pain at 6/10 on the faces scale in her lower back.  Medication brought by nursing and pt repositioned in the bed.    ADL  Eating/Feeding: Simulated;Independent Where Assessed - Eating/Feeding: Edge of bed Grooming: Simulated;Supervision/safety Where Assessed - Grooming: Supported standing Upper Body Bathing: Simulated;Set up Where Assessed - Upper Body Bathing: Unsupported sitting Lower Body Bathing: Simulated;Min guard Where Assessed - Lower Body Bathing: Supported sit to stand Upper Body Dressing: Simulated;Set up Where Assessed - Upper Body Dressing: Unsupported sitting Lower Body Dressing: Performed;Minimal assistance Where Assessed - Lower Body Dressing: Supported sit to stand Toilet Transfer:  Performed;Minimal assistance Acupuncturist: Comfort height toilet;Grab bars Toileting - Architect and Hygiene: Simulated;Min guard Where Assessed - Engineer, mining and Hygiene: Sit to stand from 3-in-1 or toilet Tub/Shower Transfer: Simulated;Min guard Tub/Shower Transfer Method: Ambulating Transfers/Ambulation Related to ADLs: Pt currently min assist for mobility without use of assistive device to ambulate to the bathroom and back to her bed. ADL Comments: Pt able to state 3/3 back precautions.  Discussed and demonstrated use of the reacher and sockaide as pt demonstrated increased difficulty with attempted donning of gripper socks when attempting to cross her LEs.  Instructed pt and daughter to use the built in shower seat for safety and place her 3:1 over the toilet to assist with sit to stand.        Visit Information  Last OT Received On: 02/16/13 Assistance Needed: +1 History of Present Illness: LUMBAR LAMINECTOMY/DECOMPRESSION , decompressing the bilateral L4 and L5 roots  - (2 levels)  - microdisection       Prior Functioning     Home Living Family/patient expects to be discharged to:: Private residence Living Arrangements: Alone Available Help at Discharge: Family;Available 24 hours/day Type of Home: House Home Access: Ramped entrance Home Layout: One level Home Equipment: Bedside commode;Walker - 4 wheels;Walker - 2 wheels;Cane - single point;Shower seat Prior Function Level of Independence: Independent;Independent with assistive device(s) Comments: cane outside Communication Communication: No difficulties Dominant Hand: Right         Vision/Perception Vision - History Baseline Vision: Wears glasses all the time Patient Visual Report: No change from baseline Vision - Assessment Eye Alignment: Within Functional Limits Perception Perception: Within Functional Limits Praxis  Praxis: Intact   Cognition   Cognition Arousal/Alertness: Awake/alert Behavior During Therapy: WFL for tasks assessed/performed Overall Cognitive Status: Within Functional Limits for tasks assessed    Extremity/Trunk Assessment Upper Extremity Assessment Upper Extremity Assessment: Overall WFL for tasks assessed (Not formally assessed secondary to back precautions) Lower Extremity Assessment Lower Extremity Assessment: Defer to PT evaluation Cervical / Trunk Assessment Cervical / Trunk Assessment: Normal     Mobility Bed Mobility Bed Mobility: Sit to Supine Sit to Supine: 5: Supervision;HOB flat Sit to Sidelying Right: 6: Modified independent (Device/Increase time);With rail Details for Bed Mobility Assistance: Pt able to bring her LEs up in the bed by herself without use of rail and HOB flat. Transfers Transfers: Sit to Stand Sit to Stand: 4: Min guard;With upper extremity assist;From toilet Stand to Sit: 4: Min guard;With upper extremity assist;To bed        Balance Balance Balance Assessed: Yes Static Standing Balance Static Standing - Balance Support: No upper extremity supported Static Standing - Level of Assistance: 5: Stand by assistance   End of Session OT - End of Session Equipment Utilized During Treatment: Gait belt Activity Tolerance: Patient tolerated treatment well Patient left: in bed;with call bell/phone within reach;with family/visitor present     Renn Dirocco OTR/L 02/16/2013, 10:03 AM

## 2013-02-16 NOTE — Discharge Summary (Signed)
Physician Discharge Summary  Patient ID: Katie Alvarado MRN: [id number] DOB/AGE: [birth year] 79 y.o.  Admit date: 02/15/2013 Discharge date: 02/16/2013  Admission Diagnoses:Lumbar stenosis    Discharge Diagnoses: Lumbar stenosis  Active Problems:   * No active hospital problems. *   Discharged Condition: good  Hospital Course: pt admitted on day of surgery  - underwent procedure below  - pt doing well, ambulating better - voiding, taking Po well  Consults: None    Treatments: surgery: LUMBAR LAMINECTOMY/DECOMPRESSION , decompressing the bilateral L4 and L5 roots - (2 levels) - microdisection   Discharge Exam: Blood pressure 114/68, pulse 80, temperature 97.9 F (36.6 C), temperature source Oral, resp. rate 18, SpO2 93.00%. Wound:c/d/i  Disposition: home   Future Appointments Provider Department Dept Phone   05/24/2013 1:45 PM Waymon Budge, MD [facility] [phone]       Medication List         acetaminophen 325 MG tablet  Commonly known as:  TYLENOL  Take 2 tablets (650 mg total) by mouth every 6 (six) hours as needed for pain.     albuterol 108 (90 BASE) MCG/ACT inhaler  Commonly known as:  PROVENTIL HFA;VENTOLIN HFA  Inhale 2 puffs into the lungs every 6 (six) hours as needed for wheezing or shortness of breath.     ALPRAZolam 0.25 MG tablet  Commonly known as:  XANAX  Take 0.25 mg by mouth 3 (three) times daily as needed for sleep or anxiety. For anxiety.     aspirin EC 81 MG tablet  Take 81 mg by mouth every morning.     budesonide 0.25 MG/2ML nebulizer solution  Commonly known as:  PULMICORT  Take 2 mLs (0.25 mg total) by nebulization daily.     chlorpheniramine-HYDROcodone 10-8 MG/5ML Lqcr  Commonly known as:  TUSSIONEX  Take 1.25 mLs by mouth every 12 (twelve) hours as needed (for cough).     cholecalciferol 1000 UNITS tablet  Commonly known as:  VITAMIN D  Take 2,000 Units by mouth every morning.     Cinnamon 500 MG capsule   Take 500 mg by mouth every morning.     cyclobenzaprine 10 MG tablet  Commonly known as:  FLEXERIL  Take 1 tablet (10 mg total) by mouth 3 (three) times daily as needed for muscle spasms.     DRY EYES OP  Apply 1 drop to eye as needed (for dry eyes).     esomeprazole 40 MG capsule  Commonly known as:  NEXIUM  Take 40 mg by mouth every morning.     Fish Oil 1000 MG Caps  Take 1 capsule by mouth daily.     furosemide 40 MG tablet  Commonly known as:  LASIX  Take 20 mg by mouth daily. 1 every other day     ipratropium 0.02 % nebulizer solution  Commonly known as:  ATROVENT  Take 2.5 mLs (500 mcg total) by nebulization 4 (four) times daily as needed.     losartan 25 MG tablet  Commonly known as:  COZAAR  Take 12.5 mg by mouth every morning.     metFORMIN 500 MG tablet  Commonly known as:  GLUCOPHAGE  Take 750 mg by mouth 2 (two) times daily.     oxyCODONE 5 MG immediate release tablet  Commonly known as:  Oxy IR/ROXICODONE  Take 1 tablet (5 mg total) by mouth every 4 (four) hours as needed.     oxyCODONE-acetaminophen 5-325 MG per tablet  Commonly known  as:  PERCOCET/ROXICET  Take 1-2 tablets by mouth every 4 (four) hours as needed for pain.     PAIN RELIEVING PATCH 5 % Pads  Generic drug:  Menthol (Topical Analgesic)  Apply 1 patch topically as needed (for back pain).     triamterene-hydrochlorothiazide 75-50 MG per tablet  Commonly known as:  MAXZIDE  Take 0.5 tablets by mouth every morning.     vitamin E 400 UNIT capsule  Take 400 Units by mouth daily.         SignedClydene Fake, MD 02/16/2013, 1:06 PM

## 2013-02-17 LAB — GLUCOSE, CAPILLARY: Glucose-Capillary: 148 mg/dL — ABNORMAL HIGH (ref 70–99)

## 2013-04-08 DIAGNOSIS — L293 Anogenital pruritus, unspecified: Secondary | ICD-10-CM | POA: Diagnosis not present

## 2013-04-08 DIAGNOSIS — N390 Urinary tract infection, site not specified: Secondary | ICD-10-CM | POA: Diagnosis not present

## 2013-04-08 DIAGNOSIS — R300 Dysuria: Secondary | ICD-10-CM | POA: Diagnosis not present

## 2013-04-08 DIAGNOSIS — B373 Candidiasis of vulva and vagina: Secondary | ICD-10-CM | POA: Diagnosis not present

## 2013-05-03 DIAGNOSIS — M545 Low back pain, unspecified: Secondary | ICD-10-CM | POA: Diagnosis not present

## 2013-05-06 DIAGNOSIS — M545 Low back pain, unspecified: Secondary | ICD-10-CM | POA: Diagnosis not present

## 2013-05-11 DIAGNOSIS — M545 Low back pain, unspecified: Secondary | ICD-10-CM | POA: Diagnosis not present

## 2013-05-19 DIAGNOSIS — H353190 Nonexudative age-related macular degeneration, unspecified eye, stage unspecified: Secondary | ICD-10-CM | POA: Diagnosis not present

## 2013-05-24 ENCOUNTER — Ambulatory Visit: Payer: Medicare Other | Admitting: Internal Medicine

## 2013-06-30 DIAGNOSIS — M999 Biomechanical lesion, unspecified: Secondary | ICD-10-CM | POA: Diagnosis not present

## 2013-06-30 DIAGNOSIS — M545 Low back pain, unspecified: Secondary | ICD-10-CM | POA: Diagnosis not present

## 2013-07-02 DIAGNOSIS — M545 Low back pain, unspecified: Secondary | ICD-10-CM | POA: Diagnosis not present

## 2013-07-02 DIAGNOSIS — M999 Biomechanical lesion, unspecified: Secondary | ICD-10-CM | POA: Diagnosis not present

## 2013-07-05 DIAGNOSIS — M545 Low back pain, unspecified: Secondary | ICD-10-CM | POA: Diagnosis not present

## 2013-07-05 DIAGNOSIS — M999 Biomechanical lesion, unspecified: Secondary | ICD-10-CM | POA: Diagnosis not present

## 2013-07-13 DIAGNOSIS — M545 Low back pain, unspecified: Secondary | ICD-10-CM | POA: Diagnosis not present

## 2013-07-19 DIAGNOSIS — L821 Other seborrheic keratosis: Secondary | ICD-10-CM | POA: Diagnosis not present

## 2013-07-19 DIAGNOSIS — L570 Actinic keratosis: Secondary | ICD-10-CM | POA: Diagnosis not present

## 2013-07-19 DIAGNOSIS — D485 Neoplasm of uncertain behavior of skin: Secondary | ICD-10-CM | POA: Diagnosis not present

## 2013-07-19 DIAGNOSIS — D239 Other benign neoplasm of skin, unspecified: Secondary | ICD-10-CM | POA: Diagnosis not present

## 2013-07-19 DIAGNOSIS — Z85828 Personal history of other malignant neoplasm of skin: Secondary | ICD-10-CM | POA: Diagnosis not present

## 2013-07-21 DIAGNOSIS — D0430 Carcinoma in situ of skin of unspecified part of face: Secondary | ICD-10-CM | POA: Diagnosis not present

## 2013-07-21 DIAGNOSIS — D0439 Carcinoma in situ of skin of other parts of face: Secondary | ICD-10-CM | POA: Diagnosis not present

## 2013-07-21 DIAGNOSIS — L570 Actinic keratosis: Secondary | ICD-10-CM | POA: Diagnosis not present

## 2013-08-30 DIAGNOSIS — C44320 Squamous cell carcinoma of skin of unspecified parts of face: Secondary | ICD-10-CM | POA: Diagnosis not present

## 2013-08-30 DIAGNOSIS — C44621 Squamous cell carcinoma of skin of unspecified upper limb, including shoulder: Secondary | ICD-10-CM | POA: Diagnosis not present

## 2013-09-08 ENCOUNTER — Ambulatory Visit (INDEPENDENT_AMBULATORY_CARE_PROVIDER_SITE_OTHER): Payer: Medicare Other | Admitting: Internal Medicine

## 2013-09-08 ENCOUNTER — Encounter: Payer: Self-pay | Admitting: Internal Medicine

## 2013-09-08 VITALS — BP 110/62 | HR 74 | Ht 68.0 in | Wt 155.9 lb

## 2013-09-08 DIAGNOSIS — Z23 Encounter for immunization: Secondary | ICD-10-CM | POA: Diagnosis not present

## 2013-09-08 DIAGNOSIS — J471 Bronchiectasis with (acute) exacerbation: Secondary | ICD-10-CM

## 2013-09-08 DIAGNOSIS — R5383 Other fatigue: Secondary | ICD-10-CM

## 2013-09-08 DIAGNOSIS — R5381 Other malaise: Secondary | ICD-10-CM

## 2013-09-08 DIAGNOSIS — I10 Essential (primary) hypertension: Secondary | ICD-10-CM | POA: Diagnosis not present

## 2013-09-08 DIAGNOSIS — E559 Vitamin D deficiency, unspecified: Secondary | ICD-10-CM | POA: Diagnosis not present

## 2013-09-08 DIAGNOSIS — R531 Weakness: Secondary | ICD-10-CM

## 2013-09-08 DIAGNOSIS — E785 Hyperlipidemia, unspecified: Secondary | ICD-10-CM | POA: Diagnosis not present

## 2013-09-08 DIAGNOSIS — E119 Type 2 diabetes mellitus without complications: Secondary | ICD-10-CM | POA: Diagnosis not present

## 2013-09-08 MED ORDER — FLUTICASONE FUROATE-VILANTEROL 100-25 MCG/INH IN AEPB: 1.0000 | INHALATION_SPRAY | Freq: Every day | RESPIRATORY_TRACT | Status: AC

## 2013-09-08 MED ORDER — LEVALBUTEROL HCL 0.63 MG/3ML IN NEBU
0.6300 mg | INHALATION_SOLUTION | Freq: Once | RESPIRATORY_TRACT | Status: AC
  Administered 2013-09-08: 0.63 mg via RESPIRATORY_TRACT

## 2013-09-08 MED ORDER — METHYLPREDNISOLONE ACETATE 80 MG/ML IJ SUSP
80.0000 mg | Freq: Once | INTRAMUSCULAR | Status: AC
  Administered 2013-09-08: 80 mg via INTRAMUSCULAR

## 2013-09-08 NOTE — Progress Notes (Signed)
04/09/11- 85 yoF never smoker, followed for bronchiectasis/ chronic bronchitis, complicated by hx of GERD, DM. LOV-May 15, 2010 Has had flu vax. We called in a Zpak for bronchitis in August - she says it helped. She complains of weakness "tired" over the past several weeks- nothing specific- but has difficulty walking around home. Was having left neck pain. Dr Ernesto Rutherford ENT gave antibiotic. Dr Lawernce Pitts gave prednisone taper, xray'd neck. She says prednisone helped her neck pain. Tried a heating pad but burned herself. Cough also improved after the prednisone.  Denies fever, but has had some sweat. Weight up, then down. No chest pain and little residual dry cough.  09/30/11- 85 yoF never smoker, followed for bronchiectasis/ chronic bronchitis, complicated by hx of GERD, DM  Patient still c/o cough with clear mucus and sob. She complains of persistent weakness such that it can be hard to get out of bed in the morning. Legs give out. She doesn't describe claudication or exertional chest pain. Weakness is more important than shortness of breath. Scant clear mucus.  CXR 04/17/11 reviewed with her. IMPRESSION:  Stable patchy parenchymal opacities as before. No acute or  superimposed abnormality.  Original Report Authenticated By: Trecia Rogers, M.D.    11/14/11- 60 yoF never smoker, followed for bronchiectasis/ chronic bronchitis, complicated by hx of GERD, DM Still prod cough w/ clear mucus, feels the biaxin did help.  Was given medrol dose pak by Dr Trudie Reed She started Medrol today. She is not focused on her breathing at this visit, being actively uncomfortable with low back and bilateral hip pains. Got a steroid injection yesterday and has tramadol but resists taking pain medications.  01/16/12- 85 yoF never smoker, followed for bronchiectasis/ chronic bronchitis, complicated by hx of GERD, DM Cough-productive-clear in color; wheezing, feels as though not moving enough air. Had flu  vaccine Increased cough with clear mucus. Sometimes back pain catches her breath. Pro air is not enough. Taking one half tramadol for cough or pain but it can make her queasy. Does have some Tussionex at home. Occasionally uses her nebulizer with ipratropium. She has been very sensitive to stimulants. Watery nose-prefers Kleenex over medication. Back pain drags her down- going to orthopedist.  05/28/12- 85 yoF never smoker, followed for bronchiectasis/ chronic bronchitis, complicated by hx of GERD, DM FOLLOWS FOR: unable to catch breath at times; wheezing, SOB, and cough--productive at times-clear in color. Ortho injected hip- limits activity less than when last here.Had dental extraction and on "penicillin" for UTI. Using neb atrovent. Clear sputum, chronic cough. Uses Proair but makes her shake. Considering cosmetic surgery.  06/09/12- 85 yoF never smoker, followed for bronchiectasis/ chronic bronchitis, complicated by hx of GERD, DM FOLLOWS FOR: Per CY-review CT results in person with patient She returns, with her granddaughter, at my request so that I can go over her CT scan. This shows significant fibrocavitary scarring and nodules which could be quite consistent with MAIC. She has a very long history of bronchiectasis which had been fairly stable in the left lower lobe for many years, but this is more extensive and bilateral. I do not get a history that she has been aspirating. She denies night sweats and coughs up almost nothing. CT chest 06/01/12- IMPRESSION:  Progressive bilateral nodular bronchiectasis. Associated nodules  have increased in size including mass-like nodule within the right  lower lobe measuring up to 1.2 cm. It is possible these changes  represent findings of Mycobacterium avium intracellulare. Tumor  difficult to exclude.  Prominent atherosclerotic type changes as detailed above.  This is a call report.  Original Report Authenticated By: Genia Del, M.D.  07/14/12-   85 yoF never smoker, followed for bronchiectasis/ chronic bronchitis/ MAIC, complicated by hx of GERD, DM   Start 07/14/12- Biaxin 500mg  TIW, EMB 1200 TIW, Rifabutin 300 TIW FOLLOWS FOR: review labs (sputum culture) in greater detail with patient; still having cough-productive-clear in color,; SOB and wheezing-worsened by activity. Chronic cough persists, scant clear mucus. Sometimes feels hot or cold with some night sweating. No blood, fever or swollen glands. Chronic back pain comes and goes. Sputum Cx 06/09/12 POS MAIC- discussed in detail with her, including discussion of medications. She has tried tramadol and Perles for cough.  08/31/12- 85 yoF never smoker, followed for bronchiectasis/ chronic bronchitis/ MAIC, complicated by hx of GERD, DM   Started 07/14/12- Biaxin 500mg  TIW, EMB 1200 TIW, Rifabutin 300 TIW  ended- 08/12/12 due to weakness and malaise.  FOLLOWS FOR: pt was put on abx's at last visit-pt states they made her sick. 2 hospital stays since 07-14-12 visit.  She says she is "sure" the triple therapy was helping her chest, but it made her too weak. She was hospitalized April 9 through April 12 because of weakness and then from April 14 - April 16 with urinary infection and diarrhea-negative for C. Difficile. She now has her persistent baseline cough, mostly dry with scant clear sputum. She denies sweats or fever but stays chilly. She mentions her poor teeth and is considering dental implants-teeth affect her chewing/nutrition. CXR 07/29/12 IMPRESSION:  1. No acute cardiopulmonary process  2. No significant interval change in the appearance of known  varicose bronchiectasis and scarring within the lingula and the  dependent aspects of the upper and lower lobes bilaterally.  Original Report Authenticated By: Jacqulynn Cadet, M.D.  09/25/12- 3 yoF never smoker, followed for bronchiectasis/ chronic bronchitis/ MAIC, complicated by hx of GERD, DM   Started 07/14/12- Biaxin 500mg  TIW, EMB 1200  TIW, Rifabutin 300 TIW  ended- 08/12/12 due to weakness and malaise FOLLOWS FOR: still continues to have cough-productive-clear in color; worse in the morning but lasts through the day  10/07/12- 85 yoF never smoker, followed for bronchiectasis/ chronic bronchitis/ MAIC, complicated by hx of GERD, DM   Started 07/14/12- Biaxin 500mg  TIW, EMB 1200 TIW, Rifabutin 300 TIW  ended- 08/12/12 due to weakness and malaise    Has grandson with her today. FOLLOWS FOR: continues to have cough-same as last OV-productive-clear in color; needs RX for Tussionex as she does not recall getting printed Rx at last OV. Also, would like  RX for Tramadol. Dr Gladstone Lighter treated prednisone taper for sore ankles. Cough pattern has not changed, reflecting her known bronchiectasis. Denies fever, blood, chest pain, purulent sputum.  01/18/13- 87 yoF never smoker, followed for bronchiectasis/ chronic bronchitis/ MAIC, complicated by hx of GERD, DM   Started 07/14/12- Biaxin 500mg  TIW, EMB 1200 TIW, Rifabutin 300 TIW  ended- 08/12/12 due to weakness and malaise  FOLLOWS FOR: surgery clearance for back surgery if cleared. Would like Proair HFA refill sent to [facility] on file. Waiting on availability of extra strength flu vaccine this fall. Pending low back surgery because of chronic pain. Denies change in her chronic cough with scant clear sputum. No chest pain, palpitation, fever or blood. Dyspnea on exertion but mobility is limited more by her back pain so she gets little exercise. Still trying to maintain her own home. Daughter coming up to stay for a  while. Office spirometry  01/18/13- moderate obstructive airways disease-FVC 2.10/69%, FEV1 1.25/58%,  FEV1/FVC  0.59/ 84%,  FEF 25-75% 0.47/33%  09/08/13- 29 yoF never smoker, followed for bronchiectasis/ chronic bronchitis/ MAIC, complicated by hx of GERD, DM   (Started 07/14/12- Biaxin 500mg  TIW, EMB 1200 TIW, Rifabutin 300 TIW  ended- 08/12/12 due to weakness and malaise) ACUTE  VISIT:weakness, SOB, and cough-non productive Seeing a chiropractor for back pain after L.-5 surgery. Dry cough. She had clean iron lawn furniture with a strong cleanser which increased her cough 3 days ago. Her primary physician today gave her vaccine Prevnar. She complains of feeling weak and asking for "a shot to make me feel better". We discussed steroids and her recent surgery/ osteoporosis. CXR 01/18/13 Heart size and pulmonary vascularity are normal. There are chronic  inflammatory changes in the right upper lung zone and in the left  mid and lower lung zones, essentially unchanged since the prior  studies consistent with the areas of bronchiectasis and chronic  inflammation noted on the prior chest x-rays and CT scan. There is  an area of chronic atelectasis and bronchiectasis in the lingula.  No acute infiltrates or effusions. No acute osseous abnormality.  IMPRESSION:  No acute abnormalities. Chronic changes in both lungs as described.  Electronically Signed  By: Rozetta Nunnery  On: 01/18/2013 17:38  ROS-see HPI Constitutional:     No-weight losst, night sweats, no-fevers, chills,  +fatigue, lassitude. HEENT:   No-  headaches, difficulty swallowing, +tooth/dental problems, no-sore throat,       No-  sneezing, itching, ear ache, nasal congestion, post nasal drip,  CV:  No-   chest pain, orthopnea, PND, swelling in lower extremities, anasarca,  dizziness, palpitations Resp: +Mild  shortness of breath with exertion or at rest.             No- productive cough,  + non-productive cough,  No- coughing up of blood.              No-   change in color of mucus.  No- wheezing.   Skin: No-   rash or lesions. GI:  No-   heartburn, indigestion, abdominal pain, nausea, vomiting,  GU:  MS:  +Active low back and bilateral hip pain Neuro-     complaint of generalized weakness without myalgias Psych:  No- change in mood or affect. No depression or anxiety.  No memory loss.  General- Alert,  Oriented, Affect-appropriate, Distress- none acute. Looks well/neatly groomed. Skin- rash-none,  excoriation- none Lymphadenopathy- none Head- atraumatic            Eyes- Gross vision intact, PERRLA, conjunctivae clear secretions            Ears- Hearing, canals-normal            Nose- Clear, no-Septal dev, mucus, polyps, erosion, perforation             Throat- Mallampati II , mucosa very dry , drainage- none, tonsils- atrophic Neck- flexible , trachea midline, no stridor , thyroid nl, carotid no bruit Chest - symmetrical excursion , unlabored           Heart/CV- RRR , no murmur , no gallop  , no rub, nl s1 s2                           - JVD- none , edema- none, stasis changes- none, varices- none  Lung-   Cough-dry, + basilar crackles L>R  , unlabored, no wheeze , dullness-none, rub- none.  Chest wall-  Abd-  Br/ Gen/ Rectal- Not done, not indicated Extrem-   No-clubbing, none, atrophy- none, strength- nl for age.  Neuro- grossly intact to observation

## 2013-09-08 NOTE — Patient Instructions (Addendum)
Neb xop 0.63  Sample Breo Ellipta 1 puff then rinse mouth, once daily maintenance. See if this helps your cough.  Depo 80

## 2013-09-10 DIAGNOSIS — R7989 Other specified abnormal findings of blood chemistry: Secondary | ICD-10-CM | POA: Diagnosis not present

## 2013-09-10 DIAGNOSIS — R82998 Other abnormal findings in urine: Secondary | ICD-10-CM | POA: Diagnosis not present

## 2013-09-15 DIAGNOSIS — M545 Low back pain, unspecified: Secondary | ICD-10-CM | POA: Diagnosis not present

## 2013-09-15 DIAGNOSIS — M999 Biomechanical lesion, unspecified: Secondary | ICD-10-CM | POA: Diagnosis not present

## 2013-09-16 DIAGNOSIS — R7989 Other specified abnormal findings of blood chemistry: Secondary | ICD-10-CM | POA: Diagnosis not present

## 2013-09-16 DIAGNOSIS — Z1211 Encounter for screening for malignant neoplasm of colon: Secondary | ICD-10-CM | POA: Diagnosis not present

## 2013-09-23 DIAGNOSIS — M5137 Other intervertebral disc degeneration, lumbosacral region: Secondary | ICD-10-CM | POA: Diagnosis not present

## 2013-09-23 DIAGNOSIS — M999 Biomechanical lesion, unspecified: Secondary | ICD-10-CM | POA: Diagnosis not present

## 2013-09-27 DIAGNOSIS — M5137 Other intervertebral disc degeneration, lumbosacral region: Secondary | ICD-10-CM | POA: Diagnosis not present

## 2013-09-27 DIAGNOSIS — M999 Biomechanical lesion, unspecified: Secondary | ICD-10-CM | POA: Diagnosis not present

## 2013-09-29 DIAGNOSIS — M5137 Other intervertebral disc degeneration, lumbosacral region: Secondary | ICD-10-CM | POA: Diagnosis not present

## 2013-09-29 DIAGNOSIS — M999 Biomechanical lesion, unspecified: Secondary | ICD-10-CM | POA: Diagnosis not present

## 2013-09-30 DIAGNOSIS — M999 Biomechanical lesion, unspecified: Secondary | ICD-10-CM | POA: Diagnosis not present

## 2013-09-30 DIAGNOSIS — M5137 Other intervertebral disc degeneration, lumbosacral region: Secondary | ICD-10-CM | POA: Diagnosis not present

## 2013-10-04 DIAGNOSIS — M5137 Other intervertebral disc degeneration, lumbosacral region: Secondary | ICD-10-CM | POA: Diagnosis not present

## 2013-10-04 DIAGNOSIS — M999 Biomechanical lesion, unspecified: Secondary | ICD-10-CM | POA: Diagnosis not present

## 2013-10-06 DIAGNOSIS — M5137 Other intervertebral disc degeneration, lumbosacral region: Secondary | ICD-10-CM | POA: Diagnosis not present

## 2013-10-06 DIAGNOSIS — D649 Anemia, unspecified: Secondary | ICD-10-CM | POA: Diagnosis not present

## 2013-10-06 DIAGNOSIS — R946 Abnormal results of thyroid function studies: Secondary | ICD-10-CM | POA: Diagnosis not present

## 2013-10-06 DIAGNOSIS — R300 Dysuria: Secondary | ICD-10-CM | POA: Diagnosis not present

## 2013-10-06 DIAGNOSIS — M999 Biomechanical lesion, unspecified: Secondary | ICD-10-CM | POA: Diagnosis not present

## 2013-10-06 DIAGNOSIS — R7989 Other specified abnormal findings of blood chemistry: Secondary | ICD-10-CM | POA: Diagnosis not present

## 2013-10-11 DIAGNOSIS — M999 Biomechanical lesion, unspecified: Secondary | ICD-10-CM | POA: Diagnosis not present

## 2013-10-11 DIAGNOSIS — E119 Type 2 diabetes mellitus without complications: Secondary | ICD-10-CM | POA: Diagnosis not present

## 2013-10-11 DIAGNOSIS — H353190 Nonexudative age-related macular degeneration, unspecified eye, stage unspecified: Secondary | ICD-10-CM | POA: Diagnosis not present

## 2013-10-11 DIAGNOSIS — M5137 Other intervertebral disc degeneration, lumbosacral region: Secondary | ICD-10-CM | POA: Diagnosis not present

## 2013-10-13 DIAGNOSIS — M5137 Other intervertebral disc degeneration, lumbosacral region: Secondary | ICD-10-CM | POA: Diagnosis not present

## 2013-10-13 DIAGNOSIS — M999 Biomechanical lesion, unspecified: Secondary | ICD-10-CM | POA: Diagnosis not present

## 2013-10-13 DIAGNOSIS — D649 Anemia, unspecified: Secondary | ICD-10-CM | POA: Diagnosis not present

## 2013-10-18 DIAGNOSIS — M999 Biomechanical lesion, unspecified: Secondary | ICD-10-CM | POA: Diagnosis not present

## 2013-10-18 DIAGNOSIS — M5137 Other intervertebral disc degeneration, lumbosacral region: Secondary | ICD-10-CM | POA: Diagnosis not present

## 2013-10-21 DIAGNOSIS — R195 Other fecal abnormalities: Secondary | ICD-10-CM | POA: Diagnosis not present

## 2013-10-24 ENCOUNTER — Encounter: Payer: Self-pay | Admitting: Internal Medicine

## 2013-10-24 NOTE — Assessment & Plan Note (Signed)
Persistent complaint. She just looks frail

## 2013-10-24 NOTE — Assessment & Plan Note (Addendum)
Irritant exposure from a strong cleanser she was using to clean on fracture. I don't think she has an acute bacterial infection. Plan-nebulizer Xopenex, sample [employer], Depo-Medrol with discussion

## 2013-10-25 DIAGNOSIS — M999 Biomechanical lesion, unspecified: Secondary | ICD-10-CM | POA: Diagnosis not present

## 2013-10-25 DIAGNOSIS — M5137 Other intervertebral disc degeneration, lumbosacral region: Secondary | ICD-10-CM | POA: Diagnosis not present

## 2013-11-01 DIAGNOSIS — M5137 Other intervertebral disc degeneration, lumbosacral region: Secondary | ICD-10-CM | POA: Diagnosis not present

## 2013-11-01 DIAGNOSIS — M999 Biomechanical lesion, unspecified: Secondary | ICD-10-CM | POA: Diagnosis not present

## 2013-11-05 DIAGNOSIS — S0500XA Injury of conjunctiva and corneal abrasion without foreign body, unspecified eye, initial encounter: Secondary | ICD-10-CM | POA: Diagnosis not present

## 2013-11-08 ENCOUNTER — Encounter: Payer: Self-pay | Admitting: Internal Medicine

## 2013-11-08 ENCOUNTER — Ambulatory Visit (INDEPENDENT_AMBULATORY_CARE_PROVIDER_SITE_OTHER): Payer: Medicare Other | Admitting: Internal Medicine

## 2013-11-08 VITALS — BP 110/62 | HR 88 | Ht 68.0 in | Wt 157.0 lb

## 2013-11-08 DIAGNOSIS — R5383 Other fatigue: Secondary | ICD-10-CM

## 2013-11-08 DIAGNOSIS — J479 Bronchiectasis, uncomplicated: Secondary | ICD-10-CM

## 2013-11-08 DIAGNOSIS — R5381 Other malaise: Secondary | ICD-10-CM

## 2013-11-08 DIAGNOSIS — R531 Weakness: Secondary | ICD-10-CM

## 2013-11-08 DIAGNOSIS — S0500XA Injury of conjunctiva and corneal abrasion without foreign body, unspecified eye, initial encounter: Secondary | ICD-10-CM | POA: Diagnosis not present

## 2013-11-08 MED ORDER — HYDROCODONE-HOMATROPINE 5-1.5 MG/5ML PO SYRP
5.0000 mL | ORAL_SOLUTION | Freq: Four times a day (QID) | ORAL | Status: DC | PRN
Start: 2013-11-08 — End: 2015-07-20

## 2013-11-08 NOTE — Progress Notes (Signed)
04/09/11- 85 yoF never smoker, followed for bronchiectasis/ chronic bronchitis, complicated by hx of GERD, DM. LOV-May 15, 2010 Has had flu vax. We called in a Zpak for bronchitis in August - she says it helped. She complains of weakness "tired" over the past several weeks- nothing specific- but has difficulty walking around home. Was having left neck pain. Dr Ernesto Rutherford ENT gave antibiotic. Dr Lawernce Pitts gave prednisone taper, xray'd neck. She says prednisone helped her neck pain. Tried a heating pad but burned herself. Cough also improved after the prednisone.  Denies fever, but has had some sweat. Weight up, then down. No chest pain and little residual dry cough.  09/30/11- 85 yoF never smoker, followed for bronchiectasis/ chronic bronchitis, complicated by hx of GERD, DM  Patient still c/o cough with clear mucus and sob. She complains of persistent weakness such that it can be hard to get out of bed in the morning. Legs give out. She doesn't describe claudication or exertional chest pain. Weakness is more important than shortness of breath. Scant clear mucus.  CXR 04/17/11 reviewed with her. IMPRESSION:  Stable patchy parenchymal opacities as before. No acute or  superimposed abnormality.  Original Report Authenticated By: Trecia Rogers, M.D.    11/14/11- 60 yoF never smoker, followed for bronchiectasis/ chronic bronchitis, complicated by hx of GERD, DM Still prod cough w/ clear mucus, feels the biaxin did help.  Was given medrol dose pak by Dr Trudie Reed She started Medrol today. She is not focused on her breathing at this visit, being actively uncomfortable with low back and bilateral hip pains. Got a steroid injection yesterday and has tramadol but resists taking pain medications.  01/16/12- 85 yoF never smoker, followed for bronchiectasis/ chronic bronchitis, complicated by hx of GERD, DM Cough-productive-clear in color; wheezing, feels as though not moving enough air. Had flu  vaccine Increased cough with clear mucus. Sometimes back pain catches her breath. Pro air is not enough. Taking one half tramadol for cough or pain but it can make her queasy. Does have some Tussionex at home. Occasionally uses her nebulizer with ipratropium. She has been very sensitive to stimulants. Watery nose-prefers Kleenex over medication. Back pain drags her down- going to orthopedist.  05/28/12- 85 yoF never smoker, followed for bronchiectasis/ chronic bronchitis, complicated by hx of GERD, DM FOLLOWS FOR: unable to catch breath at times; wheezing, SOB, and cough--productive at times-clear in color. Ortho injected hip- limits activity less than when last here.Had dental extraction and on "penicillin" for UTI. Using neb atrovent. Clear sputum, chronic cough. Uses Proair but makes her shake. Considering cosmetic surgery.  06/09/12- 85 yoF never smoker, followed for bronchiectasis/ chronic bronchitis, complicated by hx of GERD, DM FOLLOWS FOR: Per CY-review CT results in person with patient She returns, with her granddaughter, at my request so that I can go over her CT scan. This shows significant fibrocavitary scarring and nodules which could be quite consistent with MAIC. She has a very long history of bronchiectasis which had been fairly stable in the left lower lobe for many years, but this is more extensive and bilateral. I do not get a history that she has been aspirating. She denies night sweats and coughs up almost nothing. CT chest 06/01/12- IMPRESSION:  Progressive bilateral nodular bronchiectasis. Associated nodules  have increased in size including mass-like nodule within the right  lower lobe measuring up to 1.2 cm. It is possible these changes  represent findings of Mycobacterium avium intracellulare. Tumor  difficult to exclude.  Prominent atherosclerotic type changes as detailed above.  This is a call report.  Original Report Authenticated By: Genia Del, M.D.  07/14/12-   85 yoF never smoker, followed for bronchiectasis/ chronic bronchitis/ MAIC, complicated by hx of GERD, DM   Start 07/14/12- Biaxin 500mg  TIW, EMB 1200 TIW, Rifabutin 300 TIW FOLLOWS FOR: review labs (sputum culture) in greater detail with patient; still having cough-productive-clear in color,; SOB and wheezing-worsened by activity. Chronic cough persists, scant clear mucus. Sometimes feels hot or cold with some night sweating. No blood, fever or swollen glands. Chronic back pain comes and goes. Sputum Cx 06/09/12 POS MAIC- discussed in detail with her, including discussion of medications. She has tried tramadol and Perles for cough.  08/31/12- 85 yoF never smoker, followed for bronchiectasis/ chronic bronchitis/ MAIC, complicated by hx of GERD, DM   Started 07/14/12- Biaxin 500mg  TIW, EMB 1200 TIW, Rifabutin 300 TIW  ended- 08/12/12 due to weakness and malaise.  FOLLOWS FOR: pt was put on abx's at last visit-pt states they made her sick. 2 hospital stays since 07-14-12 visit.  She says she is "sure" the triple therapy was helping her chest, but it made her too weak. She was hospitalized April 9 through April 12 because of weakness and then from April 14 - April 16 with urinary infection and diarrhea-negative for C. Difficile. She now has her persistent baseline cough, mostly dry with scant clear sputum. She denies sweats or fever but stays chilly. She mentions her poor teeth and is considering dental implants-teeth affect her chewing/nutrition. CXR 07/29/12 IMPRESSION:  1. No acute cardiopulmonary process  2. No significant interval change in the appearance of known  varicose bronchiectasis and scarring within the lingula and the  dependent aspects of the upper and lower lobes bilaterally.  Original Report Authenticated By: Jacqulynn Cadet, M.D.  09/25/12- 3 yoF never smoker, followed for bronchiectasis/ chronic bronchitis/ MAIC, complicated by hx of GERD, DM   Started 07/14/12- Biaxin 500mg  TIW, EMB 1200  TIW, Rifabutin 300 TIW  ended- 08/12/12 due to weakness and malaise FOLLOWS FOR: still continues to have cough-productive-clear in color; worse in the morning but lasts through the day  10/07/12- 85 yoF never smoker, followed for bronchiectasis/ chronic bronchitis/ MAIC, complicated by hx of GERD, DM   Started 07/14/12- Biaxin 500mg  TIW, EMB 1200 TIW, Rifabutin 300 TIW  ended- 08/12/12 due to weakness and malaise    Has grandson with her today. FOLLOWS FOR: continues to have cough-same as last OV-productive-clear in color; needs RX for Tussionex as she does not recall getting printed Rx at last OV. Also, would like  RX for Tramadol. Dr Gladstone Lighter treated prednisone taper for sore ankles. Cough pattern has not changed, reflecting her known bronchiectasis. Denies fever, blood, chest pain, purulent sputum.  01/18/13- 87 yoF never smoker, followed for bronchiectasis/ chronic bronchitis/ MAIC, complicated by hx of GERD, DM   Started 07/14/12- Biaxin 500mg  TIW, EMB 1200 TIW, Rifabutin 300 TIW  ended- 08/12/12 due to weakness and malaise  FOLLOWS FOR: surgery clearance for back surgery if cleared. Would like Proair HFA refill sent to [facility] on [facility]. Waiting on availability of extra strength flu vaccine this fall. Pending low back surgery because of chronic pain. Denies change in her chronic cough with scant clear sputum. No chest pain, palpitation, fever or blood. Dyspnea on exertion but mobility is limited more by her back pain so she gets little exercise. Still trying to maintain her own home. Daughter coming up to stay for a  while. Office spirometry  01/18/13- moderate obstructive airways disease-FVC 2.10/69%, FEV1 1.25/58%,  FEV1/FVC  0.59/ 84%,  FEF 25-75% 0.47/33%  09/08/13- 41 yoF never smoker, followed for bronchiectasis/ chronic bronchitis/ MAIC, complicated by hx of GERD, DM   (Started 07/14/12- Biaxin 500mg  TIW, EMB 1200 TIW, Rifabutin 300 TIW  ended- 08/12/12 due to weakness and malaise) ACUTE  VISIT:weakness, SOB, and cough-non productive Seeing a chiropractor for back pain after L.-5 surgery. Dry cough. She had clean iron lawn furniture with a strong cleanser which increased her cough 3 days ago. Her primary physician today gave her vaccine Prevnar. She complains of feeling weak and asking for "a shot to make me feel better". We discussed steroids and her recent surgery/ osteoporosis. CXR 01/18/13 Heart size and pulmonary vascularity are normal. There are chronic  inflammatory changes in the right upper lung zone and in the left  mid and lower lung zones, essentially unchanged since the prior  studies consistent with the areas of bronchiectasis and chronic  inflammation noted on the prior chest x-rays and CT scan. There is  an area of chronic atelectasis and bronchiectasis in the lingula.  No acute infiltrates or effusions. No acute osseous abnormality.  IMPRESSION:  No acute abnormalities. Chronic changes in both lungs as described.  Electronically Signed  By: Rozetta Nunnery  On: 01/18/2013 17:38  11/08/13-  87 yoF never smoker, followed for bronchiectasis/ chronic bronchitis/ MAIC, complicated by hx of GERD, DM, arthritis   (Started 07/14/12- Biaxin 500mg  TIW, EMB 1200 TIW, Rifabutin 300 TIW FOLLOWS FOR:  Still having sob, weakness and cough non-productive states no better since last ov Cough chronic, clear mucus. Cough is tiring and hurts back. Dislikes tramadol. Perles no help, tussionnex too strong.   ROS-see HPI Constitutional:     No-weight losst, night sweats, no-fevers, chills,  +fatigue, lassitude. HEENT:   No-  headaches, difficulty swallowing, +tooth/dental problems, no-sore throat,       No-  sneezing, itching, ear ache, nasal congestion, post nasal drip,  CV:  No-   chest pain, orthopnea, PND, swelling in lower extremities, anasarca,  dizziness, palpitations Resp: +Mild  shortness of breath with exertion or at rest.             +productive cough,  + non-productive  cough,  No- coughing up of blood.              No-   change in color of mucus.  No- wheezing.   Skin: No-   rash or lesions. GI:  No-   heartburn, indigestion, abdominal pain, nausea, vomiting,  GU:  MS:  +Active low back and bilateral hip pain Neuro-     complaint of generalized weakness without myalgias Psych:  No- change in mood or affect. No depression or anxiety.  No memory loss.  General- Alert, Oriented, Affect-appropriate, Distress- none acute. Looks well/neatly groomed. Skin- rash-none,  excoriation- none Lymphadenopathy- none Head- atraumatic            Eyes- Gross vision intact, PERRLA, conjunctivae clear secretions            Ears- Hearing, canals-normal            Nose- Clear, no-Septal dev, mucus, polyps, erosion, perforation             Throat- Mallampati II , mucosa very dry , drainage- none, tonsils- atrophic Neck- flexible , trachea midline, no stridor , thyroid nl, carotid no bruit Chest - symmetrical excursion , unlabored  Heart/CV- RRR , no murmur , no gallop  , no rub, nl s1 s2                           - JVD- none , edema- none, stasis changes- none, varices- none           Lung-   Cough-dry, + basilar crackles L>R  , unlabored, no wheeze , dullness-none, rub-                      none.  Chest wall-  Abd-  Br/ Gen/ Rectal- Not done, not indicated Extrem-   No-clubbing, none, atrophy- none, strength- nl for age.  Neuro- grossly intact to observation

## 2013-11-08 NOTE — Patient Instructions (Addendum)
Script for cough syrup to use if needed printed

## 2013-11-09 DIAGNOSIS — M999 Biomechanical lesion, unspecified: Secondary | ICD-10-CM | POA: Diagnosis not present

## 2013-11-09 DIAGNOSIS — M5137 Other intervertebral disc degeneration, lumbosacral region: Secondary | ICD-10-CM | POA: Diagnosis not present

## 2013-11-09 DIAGNOSIS — S0500XA Injury of conjunctiva and corneal abrasion without foreign body, unspecified eye, initial encounter: Secondary | ICD-10-CM | POA: Diagnosis not present

## 2013-11-11 DIAGNOSIS — S0500XA Injury of conjunctiva and corneal abrasion without foreign body, unspecified eye, initial encounter: Secondary | ICD-10-CM | POA: Diagnosis not present

## 2013-11-11 DIAGNOSIS — H04129 Dry eye syndrome of unspecified lacrimal gland: Secondary | ICD-10-CM | POA: Diagnosis not present

## 2013-11-15 DIAGNOSIS — H04129 Dry eye syndrome of unspecified lacrimal gland: Secondary | ICD-10-CM | POA: Diagnosis not present

## 2013-11-15 DIAGNOSIS — H1045 Other chronic allergic conjunctivitis: Secondary | ICD-10-CM | POA: Diagnosis not present

## 2013-11-15 DIAGNOSIS — S0500XA Injury of conjunctiva and corneal abrasion without foreign body, unspecified eye, initial encounter: Secondary | ICD-10-CM | POA: Diagnosis not present

## 2013-11-17 DIAGNOSIS — R300 Dysuria: Secondary | ICD-10-CM | POA: Diagnosis not present

## 2013-11-17 DIAGNOSIS — D649 Anemia, unspecified: Secondary | ICD-10-CM | POA: Diagnosis not present

## 2013-11-17 DIAGNOSIS — M109 Gout, unspecified: Secondary | ICD-10-CM | POA: Diagnosis not present

## 2013-11-19 DIAGNOSIS — H353190 Nonexudative age-related macular degeneration, unspecified eye, stage unspecified: Secondary | ICD-10-CM | POA: Diagnosis not present

## 2013-11-19 DIAGNOSIS — H04129 Dry eye syndrome of unspecified lacrimal gland: Secondary | ICD-10-CM | POA: Diagnosis not present

## 2013-11-22 DIAGNOSIS — M5137 Other intervertebral disc degeneration, lumbosacral region: Secondary | ICD-10-CM | POA: Diagnosis not present

## 2013-11-22 DIAGNOSIS — M999 Biomechanical lesion, unspecified: Secondary | ICD-10-CM | POA: Diagnosis not present

## 2013-11-25 DIAGNOSIS — H43819 Vitreous degeneration, unspecified eye: Secondary | ICD-10-CM | POA: Diagnosis not present

## 2013-11-25 DIAGNOSIS — H35439 Paving stone degeneration of retina, unspecified eye: Secondary | ICD-10-CM | POA: Diagnosis not present

## 2013-11-25 DIAGNOSIS — H47019 Ischemic optic neuropathy, unspecified eye: Secondary | ICD-10-CM | POA: Diagnosis not present

## 2013-11-25 DIAGNOSIS — H353190 Nonexudative age-related macular degeneration, unspecified eye, stage unspecified: Secondary | ICD-10-CM | POA: Diagnosis not present

## 2013-12-01 DIAGNOSIS — H353190 Nonexudative age-related macular degeneration, unspecified eye, stage unspecified: Secondary | ICD-10-CM | POA: Diagnosis not present

## 2013-12-01 DIAGNOSIS — H1045 Other chronic allergic conjunctivitis: Secondary | ICD-10-CM | POA: Diagnosis not present

## 2013-12-06 DIAGNOSIS — M999 Biomechanical lesion, unspecified: Secondary | ICD-10-CM | POA: Diagnosis not present

## 2013-12-06 DIAGNOSIS — M5137 Other intervertebral disc degeneration, lumbosacral region: Secondary | ICD-10-CM | POA: Diagnosis not present

## 2013-12-17 DIAGNOSIS — L299 Pruritus, unspecified: Secondary | ICD-10-CM | POA: Diagnosis not present

## 2013-12-17 DIAGNOSIS — T148 Other injury of unspecified body region: Secondary | ICD-10-CM | POA: Diagnosis not present

## 2013-12-17 DIAGNOSIS — R21 Rash and other nonspecific skin eruption: Secondary | ICD-10-CM | POA: Diagnosis not present

## 2013-12-20 DIAGNOSIS — M999 Biomechanical lesion, unspecified: Secondary | ICD-10-CM | POA: Diagnosis not present

## 2013-12-20 DIAGNOSIS — M5137 Other intervertebral disc degeneration, lumbosacral region: Secondary | ICD-10-CM | POA: Diagnosis not present

## 2013-12-30 DIAGNOSIS — M999 Biomechanical lesion, unspecified: Secondary | ICD-10-CM | POA: Diagnosis not present

## 2013-12-30 DIAGNOSIS — M5137 Other intervertebral disc degeneration, lumbosacral region: Secondary | ICD-10-CM | POA: Diagnosis not present

## 2014-01-03 DIAGNOSIS — M999 Biomechanical lesion, unspecified: Secondary | ICD-10-CM | POA: Diagnosis not present

## 2014-01-03 DIAGNOSIS — M5137 Other intervertebral disc degeneration, lumbosacral region: Secondary | ICD-10-CM | POA: Diagnosis not present

## 2014-01-04 DIAGNOSIS — H04129 Dry eye syndrome of unspecified lacrimal gland: Secondary | ICD-10-CM | POA: Diagnosis not present

## 2014-01-04 DIAGNOSIS — H53469 Homonymous bilateral field defects, unspecified side: Secondary | ICD-10-CM | POA: Diagnosis not present

## 2014-01-05 DIAGNOSIS — M545 Low back pain, unspecified: Secondary | ICD-10-CM | POA: Diagnosis not present

## 2014-01-05 DIAGNOSIS — I10 Essential (primary) hypertension: Secondary | ICD-10-CM | POA: Diagnosis not present

## 2014-01-05 DIAGNOSIS — M5137 Other intervertebral disc degeneration, lumbosacral region: Secondary | ICD-10-CM | POA: Diagnosis not present

## 2014-01-05 DIAGNOSIS — M999 Biomechanical lesion, unspecified: Secondary | ICD-10-CM | POA: Diagnosis not present

## 2014-01-05 DIAGNOSIS — H539 Unspecified visual disturbance: Secondary | ICD-10-CM | POA: Diagnosis not present

## 2014-01-06 ENCOUNTER — Other Ambulatory Visit: Payer: Self-pay | Admitting: Family Medicine

## 2014-01-06 DIAGNOSIS — H539 Unspecified visual disturbance: Secondary | ICD-10-CM

## 2014-01-10 ENCOUNTER — Other Ambulatory Visit: Payer: Self-pay | Admitting: Dermatology

## 2014-01-10 DIAGNOSIS — D239 Other benign neoplasm of skin, unspecified: Secondary | ICD-10-CM | POA: Diagnosis not present

## 2014-01-10 DIAGNOSIS — Z85828 Personal history of other malignant neoplasm of skin: Secondary | ICD-10-CM | POA: Diagnosis not present

## 2014-01-10 DIAGNOSIS — L821 Other seborrheic keratosis: Secondary | ICD-10-CM | POA: Diagnosis not present

## 2014-01-10 DIAGNOSIS — L570 Actinic keratosis: Secondary | ICD-10-CM | POA: Diagnosis not present

## 2014-01-10 DIAGNOSIS — D485 Neoplasm of uncertain behavior of skin: Secondary | ICD-10-CM | POA: Diagnosis not present

## 2014-01-14 ENCOUNTER — Ambulatory Visit
Admission: RE | Admit: 2014-01-14 | Discharge: 2014-01-14 | Disposition: A | Discharge status: Home / Self Care | Payer: Medicare Other | Source: Ambulatory Visit | Attending: Family Medicine | Admitting: Family Medicine

## 2014-01-14 DIAGNOSIS — H539 Unspecified visual disturbance: Secondary | ICD-10-CM | POA: Diagnosis not present

## 2014-01-14 DIAGNOSIS — H53469 Homonymous bilateral field defects, unspecified side: Secondary | ICD-10-CM | POA: Diagnosis not present

## 2014-01-14 MED ORDER — IOHEXOL 300 MG/ML  SOLN
75.0000 mL | Freq: Once | INTRAMUSCULAR | Status: AC | PRN
  Administered 2014-01-14: 75 mL via INTRAVENOUS

## 2014-01-27 DIAGNOSIS — Z23 Encounter for immunization: Secondary | ICD-10-CM | POA: Diagnosis not present

## 2014-02-10 ENCOUNTER — Ambulatory Visit: Payer: Medicare Other | Admitting: Internal Medicine

## 2014-02-15 NOTE — Assessment & Plan Note (Signed)
Non-purulent productive cough. Sounds as if symptom control is the main issue. If color changes consider reculture.  Plan - Try hydromet . Ambulate as tolerated for mobilization of secretions.

## 2014-02-15 NOTE — Assessment & Plan Note (Signed)
She is chronically ill and very limited by arthritis. Debilitation may be main cause for weakness.

## 2014-03-02 ENCOUNTER — Other Ambulatory Visit: Payer: Self-pay | Admitting: Internal Medicine

## 2014-03-02 DIAGNOSIS — J479 Bronchiectasis, uncomplicated: Secondary | ICD-10-CM

## 2014-03-03 DIAGNOSIS — M5136 Other intervertebral disc degeneration, lumbar region: Secondary | ICD-10-CM | POA: Diagnosis not present

## 2014-03-03 DIAGNOSIS — M9903 Segmental and somatic dysfunction of lumbar region: Secondary | ICD-10-CM | POA: Diagnosis not present

## 2014-03-09 DIAGNOSIS — M9903 Segmental and somatic dysfunction of lumbar region: Secondary | ICD-10-CM | POA: Diagnosis not present

## 2014-03-09 DIAGNOSIS — M5136 Other intervertebral disc degeneration, lumbar region: Secondary | ICD-10-CM | POA: Diagnosis not present

## 2014-03-22 DIAGNOSIS — M5136 Other intervertebral disc degeneration, lumbar region: Secondary | ICD-10-CM | POA: Diagnosis not present

## 2014-03-22 DIAGNOSIS — M9903 Segmental and somatic dysfunction of lumbar region: Secondary | ICD-10-CM | POA: Diagnosis not present

## 2014-03-29 DIAGNOSIS — R21 Rash and other nonspecific skin eruption: Secondary | ICD-10-CM | POA: Diagnosis not present

## 2014-03-29 DIAGNOSIS — M545 Low back pain: Secondary | ICD-10-CM | POA: Diagnosis not present

## 2014-04-11 ENCOUNTER — Ambulatory Visit: Payer: Medicare Other | Admitting: Internal Medicine

## 2014-04-19 ENCOUNTER — Other Ambulatory Visit: Payer: Self-pay | Admitting: Internal Medicine

## 2014-04-21 DIAGNOSIS — E119 Type 2 diabetes mellitus without complications: Secondary | ICD-10-CM | POA: Diagnosis not present

## 2014-04-21 DIAGNOSIS — D649 Anemia, unspecified: Secondary | ICD-10-CM | POA: Diagnosis not present

## 2014-04-27 DIAGNOSIS — N390 Urinary tract infection, site not specified: Secondary | ICD-10-CM | POA: Diagnosis not present

## 2014-04-29 DIAGNOSIS — E871 Hypo-osmolality and hyponatremia: Secondary | ICD-10-CM | POA: Diagnosis not present

## 2014-04-29 DIAGNOSIS — I10 Essential (primary) hypertension: Secondary | ICD-10-CM | POA: Diagnosis not present

## 2014-04-29 DIAGNOSIS — H3531 Nonexudative age-related macular degeneration: Secondary | ICD-10-CM | POA: Diagnosis not present

## 2014-05-03 DIAGNOSIS — M9903 Segmental and somatic dysfunction of lumbar region: Secondary | ICD-10-CM | POA: Diagnosis not present

## 2014-05-03 DIAGNOSIS — M5136 Other intervertebral disc degeneration, lumbar region: Secondary | ICD-10-CM | POA: Diagnosis not present

## 2014-05-05 DIAGNOSIS — M5136 Other intervertebral disc degeneration, lumbar region: Secondary | ICD-10-CM | POA: Diagnosis not present

## 2014-05-05 DIAGNOSIS — M9903 Segmental and somatic dysfunction of lumbar region: Secondary | ICD-10-CM | POA: Diagnosis not present

## 2014-05-11 DIAGNOSIS — M5136 Other intervertebral disc degeneration, lumbar region: Secondary | ICD-10-CM | POA: Diagnosis not present

## 2014-05-11 DIAGNOSIS — M9903 Segmental and somatic dysfunction of lumbar region: Secondary | ICD-10-CM | POA: Diagnosis not present

## 2014-05-16 ENCOUNTER — Ambulatory Visit: Payer: Medicare Other | Admitting: Internal Medicine

## 2014-05-17 ENCOUNTER — Encounter: Payer: Self-pay | Admitting: Internal Medicine

## 2014-05-17 ENCOUNTER — Ambulatory Visit (INDEPENDENT_AMBULATORY_CARE_PROVIDER_SITE_OTHER): Payer: Medicare Other | Admitting: Internal Medicine

## 2014-05-17 ENCOUNTER — Ambulatory Visit (INDEPENDENT_AMBULATORY_CARE_PROVIDER_SITE_OTHER)
Admission: RE | Admit: 2014-05-17 | Discharge: 2014-05-17 | Disposition: A | Discharge status: Home / Self Care | Payer: Medicare Other | Source: Ambulatory Visit | Attending: Internal Medicine | Admitting: Internal Medicine

## 2014-05-17 VITALS — BP 128/70 | HR 80 | Ht 68.0 in | Wt 157.4 lb

## 2014-05-17 DIAGNOSIS — I700 Atherosclerosis of aorta: Secondary | ICD-10-CM | POA: Diagnosis not present

## 2014-05-17 DIAGNOSIS — J479 Bronchiectasis, uncomplicated: Secondary | ICD-10-CM | POA: Diagnosis not present

## 2014-05-17 DIAGNOSIS — K219 Gastro-esophageal reflux disease without esophagitis: Secondary | ICD-10-CM

## 2014-05-17 MED ORDER — LEVALBUTEROL HCL 0.63 MG/3ML IN NEBU
0.6300 mg | INHALATION_SOLUTION | Freq: Once | RESPIRATORY_TRACT | Status: AC
  Administered 2014-05-17: 0.63 mg via RESPIRATORY_TRACT

## 2014-05-17 MED ORDER — METHYLPREDNISOLONE ACETATE 80 MG/ML IJ SUSP
80.0000 mg | Freq: Once | INTRAMUSCULAR | Status: AC
  Administered 2014-05-17: 80 mg via INTRAMUSCULAR

## 2014-05-17 NOTE — Patient Instructions (Signed)
Order- CXR- bronchiectasis  Depo 80  Neb xop 0.63  Sample x 3 xopenex neb ampules    Try 1/2 or 1 neb twice daily as needed, while you wait for your mail-order med to come in

## 2014-05-17 NOTE — Progress Notes (Signed)
04/09/11- 85 yoF never smoker, followed for bronchiectasis/ chronic bronchitis, complicated by hx of GERD, DM. LOV-May 15, 2010 Has had flu vax. We called in a Zpak for bronchitis in August - she says it helped. She complains of weakness "tired" over the past several weeks- nothing specific- but has difficulty walking around home. Was having left neck pain. Dr Ernesto Rutherford ENT gave antibiotic. Dr Lawernce Pitts gave prednisone taper, xray'd neck. She says prednisone helped her neck pain. Tried a heating pad but burned herself. Cough also improved after the prednisone.  Denies fever, but has had some sweat. Weight up, then down. No chest pain and little residual dry cough.  09/30/11- 85 yoF never smoker, followed for bronchiectasis/ chronic bronchitis, complicated by hx of GERD, DM  Patient still c/o cough with clear mucus and sob. She complains of persistent weakness such that it can be hard to get out of bed in the morning. Legs give out. She doesn't describe claudication or exertional chest pain. Weakness is more important than shortness of breath. Scant clear mucus.  CXR 04/17/11 reviewed with her. IMPRESSION:  Stable patchy parenchymal opacities as before. No acute or  superimposed abnormality.  Original Report Authenticated By: Trecia Rogers, M.D.    11/14/11- 60 yoF never smoker, followed for bronchiectasis/ chronic bronchitis, complicated by hx of GERD, DM Still prod cough w/ clear mucus, feels the biaxin did help.  Was given medrol dose pak by Dr Trudie Reed She started Medrol today. She is not focused on her breathing at this visit, being actively uncomfortable with low back and bilateral hip pains. Got a steroid injection yesterday and has tramadol but resists taking pain medications.  01/16/12- 85 yoF never smoker, followed for bronchiectasis/ chronic bronchitis, complicated by hx of GERD, DM Cough-productive-clear in color; wheezing, feels as though not moving enough air. Had flu  vaccine Increased cough with clear mucus. Sometimes back pain catches her breath. Pro air is not enough. Taking one half tramadol for cough or pain but it can make her queasy. Does have some Tussionex at home. Occasionally uses her nebulizer with ipratropium. She has been very sensitive to stimulants. Watery nose-prefers Kleenex over medication. Back pain drags her down- going to orthopedist.  05/28/12- 85 yoF never smoker, followed for bronchiectasis/ chronic bronchitis, complicated by hx of GERD, DM FOLLOWS FOR: unable to catch breath at times; wheezing, SOB, and cough--productive at times-clear in color. Ortho injected hip- limits activity less than when last here.Had dental extraction and on "penicillin" for UTI. Using neb atrovent. Clear sputum, chronic cough. Uses Proair but makes her shake. Considering cosmetic surgery.  06/09/12- 85 yoF never smoker, followed for bronchiectasis/ chronic bronchitis, complicated by hx of GERD, DM FOLLOWS FOR: Per CY-review CT results in person with patient She returns, with her granddaughter, at my request so that I can go over her CT scan. This shows significant fibrocavitary scarring and nodules which could be quite consistent with MAIC. She has a very long history of bronchiectasis which had been fairly stable in the left lower lobe for many years, but this is more extensive and bilateral. I do not get a history that she has been aspirating. She denies night sweats and coughs up almost nothing. CT chest 06/01/12- IMPRESSION:  Progressive bilateral nodular bronchiectasis. Associated nodules  have increased in size including mass-like nodule within the right  lower lobe measuring up to 1.2 cm. It is possible these changes  represent findings of Mycobacterium avium intracellulare. Tumor  difficult to exclude.  Prominent atherosclerotic type changes as detailed above.  This is a call report.  Original Report Authenticated By: Genia Del, M.D.  07/14/12-   85 yoF never smoker, followed for bronchiectasis/ chronic bronchitis/ MAIC, complicated by hx of GERD, DM   Start 07/14/12- Biaxin 500mg  TIW, EMB 1200 TIW, Rifabutin 300 TIW FOLLOWS FOR: review labs (sputum culture) in greater detail with patient; still having cough-productive-clear in color,; SOB and wheezing-worsened by activity. Chronic cough persists, scant clear mucus. Sometimes feels hot or cold with some night sweating. No blood, fever or swollen glands. Chronic back pain comes and goes. Sputum Cx 06/09/12 POS MAIC- discussed in detail with her, including discussion of medications. She has tried tramadol and Perles for cough.  08/31/12- 85 yoF never smoker, followed for bronchiectasis/ chronic bronchitis/ MAIC, complicated by hx of GERD, DM   Started 07/14/12- Biaxin 500mg  TIW, EMB 1200 TIW, Rifabutin 300 TIW  ended- 08/12/12 due to weakness and malaise.  FOLLOWS FOR: pt was put on abx's at last visit-pt states they made her sick. 2 hospital stays since 07-14-12 visit.  She says she is "sure" the triple therapy was helping her chest, but it made her too weak. She was hospitalized April 9 through April 12 because of weakness and then from April 14 - April 16 with urinary infection and diarrhea-negative for C. Difficile. She now has her persistent baseline cough, mostly dry with scant clear sputum. She denies sweats or fever but stays chilly. She mentions her poor teeth and is considering dental implants-teeth affect her chewing/nutrition. CXR 07/29/12 IMPRESSION:  1. No acute cardiopulmonary process  2. No significant interval change in the appearance of known  varicose bronchiectasis and scarring within the lingula and the  dependent aspects of the upper and lower lobes bilaterally.  Original Report Authenticated By: Jacqulynn Cadet, M.D.  09/25/12- 3 yoF never smoker, followed for bronchiectasis/ chronic bronchitis/ MAIC, complicated by hx of GERD, DM   Started 07/14/12- Biaxin 500mg  TIW, EMB 1200  TIW, Rifabutin 300 TIW  ended- 08/12/12 due to weakness and malaise FOLLOWS FOR: still continues to have cough-productive-clear in color; worse in the morning but lasts through the day  10/07/12- 85 yoF never smoker, followed for bronchiectasis/ chronic bronchitis/ MAIC, complicated by hx of GERD, DM   Started 07/14/12- Biaxin 500mg  TIW, EMB 1200 TIW, Rifabutin 300 TIW  ended- 08/12/12 due to weakness and malaise    Has grandson with her today. FOLLOWS FOR: continues to have cough-same as last OV-productive-clear in color; needs RX for Tussionex as she does not recall getting printed Rx at last OV. Also, would like  RX for Tramadol. Dr Gladstone Lighter treated prednisone taper for sore ankles. Cough pattern has not changed, reflecting her known bronchiectasis. Denies fever, blood, chest pain, purulent sputum.  01/18/13- 87 yoF never smoker, followed for bronchiectasis/ chronic bronchitis/ MAIC, complicated by hx of GERD, DM   Started 07/14/12- Biaxin 500mg  TIW, EMB 1200 TIW, Rifabutin 300 TIW  ended- 08/12/12 due to weakness and malaise  FOLLOWS FOR: surgery clearance for back surgery if cleared. Would like Proair HFA [facility]ill [facility]. Waiting on availability of extra strength flu vaccine this fall. Pending low back surgery because of chronic pain. Denies change in her chronic cough with scant clear sputum. No chest pain, palpitation, fever or blood. Dyspnea on exertion but mobility is limited more by her back pain so she gets little exercise. Still trying to maintain her own home. Daughter coming up to stay for a  while. Office spirometry  01/18/13- moderate obstructive airways disease-FVC 2.10/69%, FEV1 1.25/58%,  FEV1/FVC  0.59/ 84%,  FEF 25-75% 0.47/33%  09/08/13- 61 yoF never smoker, followed for bronchiectasis/ chronic bronchitis/ MAIC, complicated by hx of GERD, DM   (Started 07/14/12- Biaxin 500mg  TIW, EMB 1200 TIW, Rifabutin 300 TIW  ended- 08/12/12 due to weakness and malaise) ACUTE  VISIT:weakness, SOB, and cough-non productive Seeing a chiropractor for back pain after L.-5 surgery. Dry cough. She had clean iron lawn furniture with a strong cleanser which increased her cough 3 days ago. Her primary physician today gave her vaccine Prevnar. She complains of feeling weak and asking for "a shot to make me feel better". We discussed steroids and her recent surgery/ osteoporosis. CXR 01/18/13 Heart size and pulmonary vascularity are normal. There are chronic  inflammatory changes in the right upper lung zone and in the left  mid and lower lung zones, essentially unchanged since the prior  studies consistent with the areas of bronchiectasis and chronic  inflammation noted on the prior chest x-rays and CT scan. There is  an area of chronic atelectasis and bronchiectasis in the lingula.  No acute infiltrates or effusions. No acute osseous abnormality.  IMPRESSION:  No acute abnormalities. Chronic changes in both lungs as described.  Electronically Signed  By: Rozetta Nunnery  On: 01/18/2013 17:38  11/08/13-  87 yoF never smoker, followed for bronchiectasis/ chronic bronchitis/ MAIC, complicated by hx of GERD, DM, arthritis   (Started 07/14/12- Biaxin 500mg  TIW, EMB 1200 TIW, Rifabutin 300 TIW FOLLOWS FOR:  Still having sob, weakness and cough non-productive states no better since last ov Cough chronic, clear mucus. Cough is tiring and hurts back. Dislikes tramadol. Perles no help, tussionnex too strong.   05/17/14- 87 yoF never smoker, followed for bronchiectasis/ chronic bronchitis/ MAIC, complicated by hx of GERD, DM, arthritis   (Started 07/14/12- Biaxin 500mg  TIW, EMB 1200 TIW, Rifabutin 300 TIW- stopped 2014 due to malaise. FOLLOW FOR:  Bronchiectasis; coughing up clear mucus; SOB; DOE Breathing/dyspnea/cough or stable but cough remains. Asks an inhaled medicine to help her cough out mucus-discussed. Delivery is delayed for her mail order nebulizer medications and we discussed trial  of Xopenex again. She has been extremely sensitive to stimulant medications.  ROS-see HPI Constitutional:     No-weight loss, night sweats, no-fevers, chills,  +fatigue, lassitude. HEENT:   No-  headaches, difficulty swallowing, +tooth/dental problems, no-sore throat,       No-  sneezing, itching, ear ache, nasal congestion, post nasal drip,  CV:  No-   chest pain, orthopnea, PND, swelling in lower extremities, anasarca,  dizziness, palpitations Resp: +Mild  shortness of breath with exertion or at rest.             +productive cough,  + non-productive cough,  No- coughing up of blood.              No-   change in color of mucus.  No- wheezing.   Skin: No-   rash or lesions. GI:  No-   heartburn, indigestion, abdominal pain, nausea, vomiting,  GU:  MS:  +Active low back and bilateral hip pain Neuro-     complaint of generalized weakness without myalgias Psych:  No- change in mood or affect. No depression or anxiety.  No memory loss.  General- Alert, Oriented, Affect-appropriate, Distress- none acute. Looks well/neatly groomed. Skin- rash-none,  excoriation- none Lymphadenopathy- none Head- atraumatic            Eyes-  Gross vision intact, PERRLA, conjunctivae clear secretions            Ears- Hearing, canals-normal            Nose- Clear, no-Septal dev, mucus, polyps, erosion, perforation             Throat- Mallampati II , mucosa very dry , drainage- none, tonsils- atrophic Neck- flexible , trachea midline, no stridor , thyroid nl, carotid no bruit Chest - symmetrical excursion , unlabored           Heart/CV- RRR , no murmur , no gallop  , no rub, nl s1 s2                           - JVD- none , edema- none, stasis changes- none, varices- none           Lung-   Cough-dry, + basilar crackles L>R  , unlabored, no wheeze , dullness-none, rub-none.  Chest wall-  Abd-  Br/ Gen/ Rectal- Not done, not indicated Extrem-   No-clubbing, none, atrophy- none, strength- nl for age.  Neuro-  grossly intact to observation

## 2014-05-17 NOTE — Assessment & Plan Note (Signed)
Reviewed her antireflux precautions

## 2014-05-17 NOTE — Assessment & Plan Note (Signed)
Triple antibiotics for [facility] were not tolerated in 2014. We're watching for progression but her cough seems unchanged. She wants to see what we can do with nebulized bronchodilator until her mail order supply arrives. I discussed using half doses of Xopenex. Previously she has not tolerated albuterol or Xopenex because of stimulation. Plan- chest x-ray, nebs Xopenex, Depo-Medrol.

## 2014-05-25 DIAGNOSIS — H43813 Vitreous degeneration, bilateral: Secondary | ICD-10-CM | POA: Diagnosis not present

## 2014-05-25 DIAGNOSIS — H3531 Nonexudative age-related macular degeneration: Secondary | ICD-10-CM | POA: Diagnosis not present

## 2014-06-22 DIAGNOSIS — M5136 Other intervertebral disc degeneration, lumbar region: Secondary | ICD-10-CM | POA: Diagnosis not present

## 2014-06-22 DIAGNOSIS — M9903 Segmental and somatic dysfunction of lumbar region: Secondary | ICD-10-CM | POA: Diagnosis not present

## 2014-06-23 DIAGNOSIS — M9903 Segmental and somatic dysfunction of lumbar region: Secondary | ICD-10-CM | POA: Diagnosis not present

## 2014-06-23 DIAGNOSIS — M5136 Other intervertebral disc degeneration, lumbar region: Secondary | ICD-10-CM | POA: Diagnosis not present

## 2014-07-04 DIAGNOSIS — M9903 Segmental and somatic dysfunction of lumbar region: Secondary | ICD-10-CM | POA: Diagnosis not present

## 2014-07-04 DIAGNOSIS — M5136 Other intervertebral disc degeneration, lumbar region: Secondary | ICD-10-CM | POA: Diagnosis not present

## 2014-07-06 DIAGNOSIS — M9903 Segmental and somatic dysfunction of lumbar region: Secondary | ICD-10-CM | POA: Diagnosis not present

## 2014-07-06 DIAGNOSIS — M5136 Other intervertebral disc degeneration, lumbar region: Secondary | ICD-10-CM | POA: Diagnosis not present

## 2014-07-07 ENCOUNTER — Other Ambulatory Visit: Payer: Self-pay | Admitting: Internal Medicine

## 2014-07-13 DIAGNOSIS — M5136 Other intervertebral disc degeneration, lumbar region: Secondary | ICD-10-CM | POA: Diagnosis not present

## 2014-07-13 DIAGNOSIS — M9903 Segmental and somatic dysfunction of lumbar region: Secondary | ICD-10-CM | POA: Diagnosis not present

## 2014-07-19 DIAGNOSIS — M7071 Other bursitis of hip, right hip: Secondary | ICD-10-CM | POA: Diagnosis not present

## 2014-08-11 DIAGNOSIS — R5383 Other fatigue: Secondary | ICD-10-CM | POA: Diagnosis not present

## 2014-08-11 DIAGNOSIS — R8290 Unspecified abnormal findings in urine: Secondary | ICD-10-CM | POA: Diagnosis not present

## 2014-08-17 DIAGNOSIS — M79672 Pain in left foot: Secondary | ICD-10-CM | POA: Diagnosis not present

## 2014-08-17 DIAGNOSIS — L02612 Cutaneous abscess of left foot: Secondary | ICD-10-CM | POA: Diagnosis not present

## 2014-09-13 DIAGNOSIS — M4806 Spinal stenosis, lumbar region: Secondary | ICD-10-CM | POA: Diagnosis not present

## 2014-09-13 DIAGNOSIS — M7071 Other bursitis of hip, right hip: Secondary | ICD-10-CM | POA: Diagnosis not present

## 2014-09-14 ENCOUNTER — Other Ambulatory Visit: Payer: Self-pay | Admitting: Orthopedic Surgery

## 2014-09-14 DIAGNOSIS — M48061 Spinal stenosis, lumbar region without neurogenic claudication: Secondary | ICD-10-CM

## 2014-09-21 ENCOUNTER — Other Ambulatory Visit: Payer: Self-pay | Admitting: Orthopedic Surgery

## 2014-09-21 ENCOUNTER — Ambulatory Visit
Admission: RE | Admit: 2014-09-21 | Discharge: 2014-09-21 | Disposition: A | Discharge status: Home / Self Care | Payer: Medicare Other | Source: Ambulatory Visit | Attending: Orthopedic Surgery | Admitting: Orthopedic Surgery

## 2014-09-21 ENCOUNTER — Ambulatory Visit
Admission: RE | Admit: 2014-09-21 | Discharge: 2014-09-21 | Disposition: A | Discharge status: Home / Self Care | Payer: Self-pay | Source: Ambulatory Visit | Attending: Orthopedic Surgery | Admitting: Orthopedic Surgery

## 2014-09-21 VITALS — BP 118/59 | HR 68

## 2014-09-21 DIAGNOSIS — M48061 Spinal stenosis, lumbar region without neurogenic claudication: Secondary | ICD-10-CM

## 2014-09-21 DIAGNOSIS — M79605 Pain in left leg: Principal | ICD-10-CM

## 2014-09-21 DIAGNOSIS — M5126 Other intervertebral disc displacement, lumbar region: Secondary | ICD-10-CM | POA: Diagnosis not present

## 2014-09-21 DIAGNOSIS — M4806 Spinal stenosis, lumbar region: Secondary | ICD-10-CM | POA: Diagnosis not present

## 2014-09-21 DIAGNOSIS — M5137 Other intervertebral disc degeneration, lumbosacral region: Secondary | ICD-10-CM | POA: Diagnosis not present

## 2014-09-21 DIAGNOSIS — M79604 Pain in right leg: Secondary | ICD-10-CM

## 2014-09-21 DIAGNOSIS — M4316 Spondylolisthesis, lumbar region: Secondary | ICD-10-CM | POA: Diagnosis not present

## 2014-09-21 MED ORDER — ONDANSETRON HCL 4 MG/2ML IJ SOLN
4.0000 mg | Freq: Once | INTRAMUSCULAR | Status: AC
  Administered 2014-09-21: 4 mg via INTRAMUSCULAR

## 2014-09-21 MED ORDER — IOHEXOL 180 MG/ML  SOLN
15.0000 mL | Freq: Once | INTRAMUSCULAR | Status: AC | PRN
  Administered 2014-09-21: 15 mL via INTRATHECAL

## 2014-09-21 MED ORDER — ONDANSETRON HCL 4 MG/2ML IJ SOLN: 4.0000 mg | Freq: Four times a day (QID) | INTRAMUSCULAR | Status: DC | PRN

## 2014-09-21 MED ORDER — MEPERIDINE HCL 100 MG/ML IJ SOLN
50.0000 mg | Freq: Once | INTRAMUSCULAR | Status: AC
  Administered 2014-09-21: 50 mg via INTRAMUSCULAR

## 2014-09-21 NOTE — Discharge Instructions (Signed)

## 2014-09-30 DIAGNOSIS — M4806 Spinal stenosis, lumbar region: Secondary | ICD-10-CM | POA: Diagnosis not present

## 2014-10-07 ENCOUNTER — Ambulatory Visit: Payer: Medicare Other | Admitting: Podiatry

## 2014-10-07 DIAGNOSIS — M25551 Pain in right hip: Secondary | ICD-10-CM | POA: Diagnosis not present

## 2014-10-12 ENCOUNTER — Ambulatory Visit (INDEPENDENT_AMBULATORY_CARE_PROVIDER_SITE_OTHER): Payer: Medicare Other | Admitting: Podiatry

## 2014-10-12 ENCOUNTER — Ambulatory Visit: Payer: Medicare Other | Admitting: Podiatry

## 2014-10-12 DIAGNOSIS — Q828 Other specified congenital malformations of skin: Secondary | ICD-10-CM

## 2014-10-12 NOTE — Progress Notes (Signed)
Subjective:     Patient ID: SUSSAN METER, female   DOB: April 02, 1926, 81 y.o.   MRN: [id number]  HPIThis patient presents to the office with painful knots on the bottom of both feet.  She says the knot under her left forefoot is most painful.  She says there is pain walking.  She has been seen by anther doctor in [place] for treatment but presents today for continued care of her feet. Patient says she is a diabetic.   Review of Systems     Objective:   Physical Exam Objective: Review of past medical history, medications, social history and allergies were performed.  Vascular: Dorsalis pedis and posterior tibial pulses were palpable B/L, capillary refill was  WNL B/L, temperature gradient was WNL B/L   Skin:  Porokeratosis sub 4th left and sub 5th B/L  Nails: appear healthy with no signs of mycosis or infections  Sensory: Semmes Weinstein monifilament WNL   Orthopedic: Orthopedic evaluation demonstrates all joints distal t ankle have full ROM without crepitus, muscle power WNL B/L    Assessment:     Porokeratosis     Plan:     IE  Debridement of porokeratosis.  Told her to pick up spenco insoles for her shoes.

## 2014-10-18 DIAGNOSIS — M25551 Pain in right hip: Secondary | ICD-10-CM | POA: Diagnosis not present

## 2014-10-27 ENCOUNTER — Telehealth: Payer: Self-pay | Admitting: Internal Medicine

## 2014-10-27 DIAGNOSIS — A310 Pulmonary mycobacterial infection: Secondary | ICD-10-CM

## 2014-10-27 NOTE — Telephone Encounter (Signed)
ATC pt line busy x 3 wcb 

## 2014-10-28 NOTE — Telephone Encounter (Signed)
Attempted to call pt. No answer, no option to leave a message. Will try back. 

## 2014-10-31 MED ORDER — IPRATROPIUM BROMIDE 0.02 % IN SOLN: 500.0000 ug | Freq: Four times a day (QID) | RESPIRATORY_TRACT | Status: DC | PRN

## 2014-10-31 MED ORDER — BUDESONIDE 0.25 MG/2ML IN SUSP: 0.2500 mg | Freq: Every day | RESPIRATORY_TRACT | Status: DC

## 2014-10-31 NOTE — Telephone Encounter (Signed)
Patient says [facility] has discontinued service in her area, she needs a new DME to service her medications for her neb machine.  Wants to know who can take over her service. Order entered for new DME to take care of Neb. Nothing further needed.

## 2014-10-31 NOTE — Telephone Encounter (Signed)
Called lmtcb x1 

## 2014-10-31 NOTE — Telephone Encounter (Signed)
ATC PT NA. wcb

## 2014-10-31 NOTE — Telephone Encounter (Signed)
Pt returned call - [phone]

## 2014-11-01 ENCOUNTER — Telehealth: Payer: Self-pay | Admitting: Internal Medicine

## 2014-11-01 DIAGNOSIS — A310 Pulmonary mycobacterial infection: Secondary | ICD-10-CM

## 2014-11-01 MED ORDER — IPRATROPIUM BROMIDE 0.02 % IN SOLN: 500.0000 ug | Freq: Four times a day (QID) | RESPIRATORY_TRACT | Status: DC

## 2014-11-01 NOTE — Telephone Encounter (Signed)
Per SN,   Ok to change medication instructions to QID instead of as needed.   Per Joellen Jersey, message routed to triage

## 2014-11-01 NOTE — Telephone Encounter (Signed)
Allergies  Allergen Reactions  . Welchol [Colesevelam Hcl] Hives  . Glucosamine Other (See Comments)    Unknown  . Tramadol Nausea Only  . Celecoxib Hives, Itching and Rash  . Statins Rash       Spoke with Shanay, a pharmacist, that states that Medicare won't cover the Atrovent neb solution because it states 4 times a day AS NEEDED. He is asking if we can give instructions to say 4 times a day only so that it will be covered. This is a patient of CY's.   SN please advise.

## 2014-11-01 NOTE — Telephone Encounter (Signed)
Called spoke with [facility] and gave VO. Medication MAR in epic update.  Nothing further needed

## 2014-11-01 NOTE — Telephone Encounter (Signed)
Called Katie Alvarado. She reports she no longer needs anything further. They found another DX in pt chart notes and resubmitted this to the pharm. Nothing further needed

## 2014-11-13 DIAGNOSIS — R5383 Other fatigue: Secondary | ICD-10-CM | POA: Diagnosis not present

## 2014-11-13 DIAGNOSIS — R05 Cough: Secondary | ICD-10-CM | POA: Diagnosis not present

## 2014-11-15 ENCOUNTER — Encounter: Payer: Self-pay | Admitting: Internal Medicine

## 2014-11-15 ENCOUNTER — Other Ambulatory Visit (INDEPENDENT_AMBULATORY_CARE_PROVIDER_SITE_OTHER): Payer: Medicare Other

## 2014-11-15 ENCOUNTER — Ambulatory Visit (INDEPENDENT_AMBULATORY_CARE_PROVIDER_SITE_OTHER): Payer: Medicare Other | Admitting: Internal Medicine

## 2014-11-15 VITALS — BP 110/62 | HR 89 | Ht 68.0 in | Wt 153.8 lb

## 2014-11-15 DIAGNOSIS — J479 Bronchiectasis, uncomplicated: Secondary | ICD-10-CM | POA: Diagnosis not present

## 2014-11-15 DIAGNOSIS — E871 Hypo-osmolality and hyponatremia: Secondary | ICD-10-CM

## 2014-11-15 LAB — BASIC METABOLIC PANEL
BUN: 24 mg/dL — ABNORMAL HIGH (ref 6–23)
CALCIUM: 9.8 mg/dL (ref 8.4–10.5)
CHLORIDE: 95 meq/L — AB (ref 96–112)
CO2: 27 meq/L (ref 19–32)
Creatinine, Ser: 0.99 mg/dL (ref 0.40–1.20)
GFR: 56.4 mL/min — ABNORMAL LOW (ref >60.00)
Glucose, Bld: 103 mg/dL — ABNORMAL HIGH (ref 70–99)
Potassium: 4 mEq/L (ref 3.5–5.1)
Sodium: 131 mEq/L — ABNORMAL LOW (ref 135–145)

## 2014-11-15 LAB — CBC WITH DIFFERENTIAL/PLATELET
Basophils Absolute: 0 10*3/uL (ref 0.0–0.1)
Basophils Relative: 0.4 % (ref 0.0–3.0)
EOS ABS: 0.3 10*3/uL (ref 0.0–0.7)
Eosinophils Relative: 3 % (ref 0.0–5.0)
HCT: 36.7 % (ref 36.0–46.0)
Hemoglobin: 12.3 g/dL (ref 12.0–15.0)
LYMPHS ABS: 1.5 10*3/uL (ref 0.7–4.0)
Lymphocytes Relative: 17.5 % (ref 12.0–46.0)
MCHC: 33.5 g/dL (ref 30.0–36.0)
MCV: 89.9 fl (ref 78.0–100.0)
MONO ABS: 0.9 10*3/uL (ref 0.1–1.0)
Monocytes Relative: 10.4 % (ref 3.0–12.0)
NEUTROS ABS: 5.8 10*3/uL (ref 1.4–7.7)
Neutrophils Relative %: 68.7 % (ref 43.0–77.0)
Platelets: 293 10*3/uL (ref 150.0–400.0)
RBC: 4.08 Mil/uL (ref 3.87–5.11)
RDW: 13.6 % (ref 11.5–15.5)
WBC: 8.5 10*3/uL (ref 4.0–10.5)

## 2014-11-15 MED ORDER — METHYLPREDNISOLONE ACETATE 80 MG/ML IJ SUSP
80.0000 mg | Freq: Once | INTRAMUSCULAR | Status: AC
  Administered 2014-11-15: 80 mg via INTRAMUSCULAR

## 2014-11-15 NOTE — Addendum Note (Signed)
Addended by: Lorane Gell on: 11/15/2014 04:28 PM   Modules accepted: Orders

## 2014-11-15 NOTE — Progress Notes (Signed)
04/09/11- 85 yoF never smoker, followed for bronchiectasis/ chronic bronchitis, complicated by hx of GERD, DM. LOV-May 15, 2010 Has had flu vax. We called in a Zpak for bronchitis in August - she says it helped. She complains of weakness "tired" over the past several weeks- nothing specific- but has difficulty walking around home. Was having left neck pain. Dr Ernesto Rutherford ENT gave antibiotic. Dr Lawernce Pitts gave prednisone taper, xray'd neck. She says prednisone helped her neck pain. Tried a heating pad but burned herself. Cough also improved after the prednisone.  Denies fever, but has had some sweat. Weight up, then down. No chest pain and little residual dry cough.  09/30/11- 85 yoF never smoker, followed for bronchiectasis/ chronic bronchitis, complicated by hx of GERD, DM  Patient still c/o cough with clear mucus and sob. She complains of persistent weakness such that it can be hard to get out of bed in the morning. Legs give out. She doesn't describe claudication or exertional chest pain. Weakness is more important than shortness of breath. Scant clear mucus.  CXR 04/17/11 reviewed with her. IMPRESSION:  Stable patchy parenchymal opacities as before. No acute or  superimposed abnormality.  Original Report Authenticated By: Trecia Rogers, M.D.    11/14/11- 73 yoF never smoker, followed for bronchiectasis/ chronic bronchitis, complicated by hx of GERD, DM Still prod cough w/ clear mucus, feels the biaxin did help.  Was given medrol dose pak by Dr Trudie Reed She started Medrol today. She is not focused on her breathing at this visit, being actively uncomfortable with low back and bilateral hip pains. Got a steroid injection yesterday and has tramadol but resists taking pain medications.  01/16/12- 85 yoF never smoker, followed for bronchiectasis/ chronic bronchitis, complicated by hx of GERD, DM Cough-productive-clear in color; wheezing, feels as though not moving enough air. Had flu  vaccine Increased cough with clear mucus. Sometimes back pain catches her breath. Pro air is not enough. Taking one half tramadol for cough or pain but it can make her queasy. Does have some Tussionex at home. Occasionally uses her nebulizer with ipratropium. She has been very sensitive to stimulants. Watery nose-prefers Kleenex over medication. Back pain drags her down- going to orthopedist.  05/28/12- 85 yoF never smoker, followed for bronchiectasis/ chronic bronchitis, complicated by hx of GERD, DM FOLLOWS FOR: unable to catch breath at times; wheezing, SOB, and cough--productive at times-clear in color. Ortho injected hip- limits activity less than when last here.Had dental extraction and on "penicillin" for UTI. Using neb atrovent. Clear sputum, chronic cough. Uses Proair but makes her shake. Considering cosmetic surgery.  06/09/12- 85 yoF never smoker, followed for bronchiectasis/ chronic bronchitis, complicated by hx of GERD, DM FOLLOWS FOR: Per CY-review CT results in person with patient She returns, with her granddaughter, at my request so that I can go over her CT scan. This shows significant fibrocavitary scarring and nodules which could be quite consistent with MAIC. She has a very long history of bronchiectasis which had been fairly stable in the left lower lobe for many years, but this is more extensive and bilateral. I do not get a history that she has been aspirating. She denies night sweats and coughs up almost nothing. CT chest 06/01/12- IMPRESSION:  Progressive bilateral nodular bronchiectasis. Associated nodules  have increased in size including mass-like nodule within the right  lower lobe measuring up to 1.2 cm. It is possible these changes  represent findings of Mycobacterium avium intracellulare. Tumor  difficult to exclude.  Prominent atherosclerotic type changes as detailed above.  This is a call report.  Original Report Authenticated By: Genia Del, M.D.  07/14/12-   85 yoF never smoker, followed for bronchiectasis/ chronic bronchitis/ MAIC, complicated by hx of GERD, DM   Start 07/14/12- Biaxin 500mg  TIW, EMB 1200 TIW, Rifabutin 300 TIW FOLLOWS FOR: review labs (sputum culture) in greater detail with patient; still having cough-productive-clear in color,; SOB and wheezing-worsened by activity. Chronic cough persists, scant clear mucus. Sometimes feels hot or cold with some night sweating. No blood, fever or swollen glands. Chronic back pain comes and goes. Sputum Cx 06/09/12 POS MAIC- discussed in detail with her, including discussion of medications. She has tried tramadol and Perles for cough.  08/31/12- 85 yoF never smoker, followed for bronchiectasis/ chronic bronchitis/ MAIC, complicated by hx of GERD, DM   Started 07/14/12- Biaxin 500mg  TIW, EMB 1200 TIW, Rifabutin 300 TIW  ended- 08/12/12 due to weakness and malaise.  FOLLOWS FOR: pt was put on abx's at last visit-pt states they made her sick. 2 hospital stays since 07-14-12 visit.  She says she is "sure" the triple therapy was helping her chest, but it made her too weak. She was hospitalized April 9 through April 12 because of weakness and then from April 14 - April 16 with urinary infection and diarrhea-negative for C. Difficile. She now has her persistent baseline cough, mostly dry with scant clear sputum. She denies sweats or fever but stays chilly. She mentions her poor teeth and is considering dental implants-teeth affect her chewing/nutrition. CXR 07/29/12 IMPRESSION:  1. No acute cardiopulmonary process  2. No significant interval change in the appearance of known  varicose bronchiectasis and scarring within the lingula and the  dependent aspects of the upper and lower lobes bilaterally.  Original Report Authenticated By: Jacqulynn Cadet, M.D.  09/25/12- 8 yoF never smoker, followed for bronchiectasis/ chronic bronchitis/ MAIC, complicated by hx of GERD, DM   Started 07/14/12- Biaxin 500mg  TIW, EMB 1200  TIW, Rifabutin 300 TIW  ended- 08/12/12 due to weakness and malaise FOLLOWS FOR: still continues to have cough-productive-clear in color; worse in the morning but lasts through the day  10/07/12- 85 yoF never smoker, followed for bronchiectasis/ chronic bronchitis/ MAIC, complicated by hx of GERD, DM   Started 07/14/12- Biaxin 500mg  TIW, EMB 1200 TIW, Rifabutin 300 TIW  ended- 08/12/12 due to weakness and malaise    Has grandson with her today. FOLLOWS FOR: continues to have cough-same as last OV-productive-clear in color; needs RX for Tussionex as she does not recall getting printed Rx at last OV. Also, would like  RX for Tramadol. Dr Gladstone Lighter treated prednisone taper for sore ankles. Cough pattern has not changed, reflecting her known bronchiectasis. Denies fever, blood, chest pain, purulent sputum.  01/18/13- 87 yoF never smoker, followed for bronchiectasis/ chronic bronchitis/ MAIC, complicated by hx of GERD, DM   Started 07/14/12- Biaxin 500mg  TIW, EMB 1200 TIW, Rifabutin 300 TIW  ended- 08/12/12 due to weakness and malaise  FOLLOWS FOR: surgery clearance for back surgery if cleared. Would like Proair HFA refill sent to [facility] on file. Waiting on availability of extra strength flu vaccine this fall. Pending low back surgery because of chronic pain. Denies change in her chronic cough with scant clear sputum. No chest pain, palpitation, fever or blood. Dyspnea on exertion but mobility is limited more by her back pain so she gets little exercise. Still trying to maintain her own home. Daughter coming up to stay for a  while. Office spirometry  01/18/13- moderate obstructive airways disease-FVC 2.10/69%, FEV1 1.25/58%,  FEV1/FVC  0.59/ 84%,  FEF 25-75% 0.47/33%  09/08/13- 80 yoF never smoker, followed for bronchiectasis/ chronic bronchitis/ MAIC, complicated by hx of GERD, DM   (Started 07/14/12- Biaxin 500mg  TIW, EMB 1200 TIW, Rifabutin 300 TIW  ended- 08/12/12 due to weakness and malaise) ACUTE  VISIT:weakness, SOB, and cough-non productive Seeing a chiropractor for back pain after L.-5 surgery. Dry cough. She had clean iron lawn furniture with a strong cleanser which increased her cough 3 days ago. Her primary physician today gave her vaccine Prevnar. She complains of feeling weak and asking for "a shot to make me feel better". We discussed steroids and her recent surgery/ osteoporosis. CXR 01/18/13 Heart size and pulmonary vascularity are normal. There are chronic  inflammatory changes in the right upper lung zone and in the left  mid and lower lung zones, essentially unchanged since the prior  studies consistent with the areas of bronchiectasis and chronic  inflammation noted on the prior chest x-rays and CT scan. There is  an area of chronic atelectasis and bronchiectasis in the lingula.  No acute infiltrates or effusions. No acute osseous abnormality.  IMPRESSION:  No acute abnormalities. Chronic changes in both lungs as described.  Electronically Signed  By: Rozetta Nunnery  On: 01/18/2013 17:38  11/08/13-  87 yoF never smoker, followed for bronchiectasis/ chronic bronchitis/ MAIC, complicated by hx of GERD, DM, arthritis   (Started 07/14/12- Biaxin 500mg  TIW, EMB 1200 TIW, Rifabutin 300 TIW FOLLOWS FOR:  Still having sob, weakness and cough non-productive states no better since last ov Cough chronic, clear mucus. Cough is tiring and hurts back. Dislikes tramadol. Perles no help, tussionnex too strong.   05/17/14- 87 yoF never smoker, followed for bronchiectasis/ chronic bronchitis/ MAIC, complicated by hx of GERD, DM, arthritis   (Started 07/14/12- Biaxin 500mg  TIW, EMB 1200 TIW, Rifabutin 300 TIW- stopped 2014 due to malaise. FOLLOW FOR:  Bronchiectasis; coughing up clear mucus; SOB; DOE Breathing/dyspnea/cough or stable but cough remains. Asks an inhaled medicine to help her cough out mucus-discussed. Delivery is delayed for her mail order nebulizer medications and we discussed trial  of Xopenex again. She has been extremely sensitive to stimulant medications.  11/15/14- 87 yoF never smoker, followed for bronchiectasis/ chronic bronchitis/ MAIC, complicated by hx of GERD, DM, arthritis   (Started 07/14/12- Biaxin 500mg  TIW, EMB 1200 TIW, Rifabutin 300 TIW- stopped 2014 due to malaise. FOLLOWS FOR: Went to [facility] sunday-was told today by them that her sodium level is low; pt remains weak for about 3 weeks now and was told to follow up with CY. Pt states she has gotten weaker since Sunday. SOB as well. No energy. She hasn't felt well for a long time-weakness and joint pains. Has had episodes of hyponatremia. She is now drinking Gatorade once daily and was taken off her diuretic. She will be seeing Dr. Rex Kras soon, but asked we go ahead and check her sodium level today. Not much cough or phlegm. Tussive tenderness in the last day or 2 left mid axillary rib. We reviewed chest x-ray image CXR- 05/17/14 IMPRESSION: 1. Spectrum of findings similar to numerous prior examinations, as above, suggestive of underlying MAI. 2. Atherosclerosis. Electronically Signed  By: Vinnie Langton M.D.  On: 05/17/2014 15:56  ROS-see HPI Constitutional:     No-weight loss, night sweats, no-fevers, chills,  +fatigue, lassitude. HEENT:   No-  headaches, difficulty swallowing, +tooth/dental problems, no-sore throat,  No-  sneezing, itching, ear ache, nasal congestion, post nasal drip,  CV:  No-   chest pain, orthopnea, PND, swelling in lower extremities, anasarca,  dizziness, palpitations Resp: +Mild  shortness of breath with exertion or at rest.             +productive cough,  + non-productive cough,  No- coughing up of blood.              No-   change in color of mucus.  No- wheezing.   Skin: No-   rash or lesions. GI:  No-   heartburn, indigestion, abdominal pain, nausea, vomiting,  GU:  MS:  +Active low back and bilateral hip pain Neuro-     complaint of generalized weakness without  myalgias Psych:  No- change in mood or affect. No depression or anxiety.  No memory loss.  General- Alert, Oriented, Affect-appropriate, Distress- none acute. Looks well/neatly groomed. Skin- rash-none,  excoriation- none Lymphadenopathy- none Head- atraumatic            Eyes- Gross vision intact, PERRLA, conjunctivae clear secretions            Ears- Hearing, canals-normal            Nose- Clear, no-Septal dev, mucus, polyps, erosion, perforation             Throat- Mallampati II , mucosa very dry , drainage- none, tonsils- atrophic Neck- flexible , trachea midline, no stridor , thyroid nl, carotid no bruit Chest - symmetrical excursion , unlabored           Heart/CV- RRR , no murmur , no gallop  , no rub, nl s1 s2                           - JVD- none , edema- none, stasis changes- none, varices- none           Lung-   Cough-dry, + faint basilar crackles L>R  , unlabored, no wheeze , dullness-none, rub-none.  Chest wall- + may be minimal tenderness to firm pressure over a left mid axillary rib Abd-  Br/ Gen/ Rectal- Not done, not indicated Extrem-   No-clubbing, none, atrophy- none, strength- nl for age. + Cool/dusky feet Neuro- grossly intact to observation

## 2014-11-15 NOTE — Assessment & Plan Note (Addendum)
She was told at urgent care [facility] this weekend that her sodium was in the 120 range. She was taken off her diuretic but suspect there is a tendency towards SIADH from her chronic lung disease. She asked we recheck serum sodium today but I directed her back to her primary physician for attention and management as appropriate. Hyponatremia would certainly contribute to her complaint of weakness, but she has multiple medical problems at age 81. Plan-lab for serum sodium and for CBC

## 2014-11-15 NOTE — Assessment & Plan Note (Addendum)
She has chronic bronchiectasis with bronchitis and has cultured MAIC for which she was unable to complete a course of treatment, but is really describing little in the way of active respiratory symptoms. Incidental mention of a little soreness in her left mid axillary rib today doesn't sound like much. She is asked to report if it fails to resolve. She would like a cortisone shot to see if it will make her feel a little stronger as it has occasionally in the past. Steroid talk relative to blood sugar and electrolytes. Plan continue long-term surveillance, Depo-Medrol

## 2014-11-15 NOTE — Patient Instructions (Signed)
Gatorade twice daily  Depo 80  Order- lab- BMET    Dx hyponatremia, CBC w diff  We will request result of chest xray done this weekend at [place]

## 2014-12-28 DIAGNOSIS — J479 Bronchiectasis, uncomplicated: Secondary | ICD-10-CM | POA: Diagnosis not present

## 2014-12-28 DIAGNOSIS — E559 Vitamin D deficiency, unspecified: Secondary | ICD-10-CM | POA: Diagnosis not present

## 2014-12-28 DIAGNOSIS — E119 Type 2 diabetes mellitus without complications: Secondary | ICD-10-CM | POA: Diagnosis not present

## 2014-12-28 DIAGNOSIS — M109 Gout, unspecified: Secondary | ICD-10-CM | POA: Diagnosis not present

## 2014-12-28 DIAGNOSIS — Z23 Encounter for immunization: Secondary | ICD-10-CM | POA: Diagnosis not present

## 2014-12-28 DIAGNOSIS — D649 Anemia, unspecified: Secondary | ICD-10-CM | POA: Diagnosis not present

## 2014-12-28 DIAGNOSIS — I10 Essential (primary) hypertension: Secondary | ICD-10-CM | POA: Diagnosis not present

## 2014-12-28 DIAGNOSIS — R350 Frequency of micturition: Secondary | ICD-10-CM | POA: Diagnosis not present

## 2014-12-28 DIAGNOSIS — E785 Hyperlipidemia, unspecified: Secondary | ICD-10-CM | POA: Diagnosis not present

## 2014-12-30 DIAGNOSIS — H3531 Nonexudative age-related macular degeneration: Secondary | ICD-10-CM | POA: Diagnosis not present

## 2014-12-30 DIAGNOSIS — H43813 Vitreous degeneration, bilateral: Secondary | ICD-10-CM | POA: Diagnosis not present

## 2014-12-30 DIAGNOSIS — H16223 Keratoconjunctivitis sicca, not specified as Sjogren's, bilateral: Secondary | ICD-10-CM | POA: Diagnosis not present

## 2014-12-30 DIAGNOSIS — H3532 Exudative age-related macular degeneration: Secondary | ICD-10-CM | POA: Diagnosis not present

## 2014-12-30 DIAGNOSIS — E119 Type 2 diabetes mellitus without complications: Secondary | ICD-10-CM | POA: Diagnosis not present

## 2015-01-04 DIAGNOSIS — E559 Vitamin D deficiency, unspecified: Secondary | ICD-10-CM | POA: Diagnosis not present

## 2015-01-04 DIAGNOSIS — R350 Frequency of micturition: Secondary | ICD-10-CM | POA: Diagnosis not present

## 2015-01-04 DIAGNOSIS — I10 Essential (primary) hypertension: Secondary | ICD-10-CM | POA: Diagnosis not present

## 2015-01-04 DIAGNOSIS — E785 Hyperlipidemia, unspecified: Secondary | ICD-10-CM | POA: Diagnosis not present

## 2015-01-04 DIAGNOSIS — J479 Bronchiectasis, uncomplicated: Secondary | ICD-10-CM | POA: Diagnosis not present

## 2015-01-04 DIAGNOSIS — D649 Anemia, unspecified: Secondary | ICD-10-CM | POA: Diagnosis not present

## 2015-01-04 DIAGNOSIS — M109 Gout, unspecified: Secondary | ICD-10-CM | POA: Diagnosis not present

## 2015-01-04 DIAGNOSIS — E119 Type 2 diabetes mellitus without complications: Secondary | ICD-10-CM | POA: Diagnosis not present

## 2015-01-18 DIAGNOSIS — H3532 Exudative age-related macular degeneration: Secondary | ICD-10-CM | POA: Diagnosis not present

## 2015-01-31 DIAGNOSIS — H16223 Keratoconjunctivitis sicca, not specified as Sjogren's, bilateral: Secondary | ICD-10-CM | POA: Diagnosis not present

## 2015-01-31 DIAGNOSIS — H53453 Other localized visual field defect, bilateral: Secondary | ICD-10-CM | POA: Diagnosis not present

## 2015-01-31 DIAGNOSIS — H353221 Exudative age-related macular degeneration, left eye, with active choroidal neovascularization: Secondary | ICD-10-CM | POA: Diagnosis not present

## 2015-02-17 DIAGNOSIS — H1089 Other conjunctivitis: Secondary | ICD-10-CM | POA: Diagnosis not present

## 2015-02-27 ENCOUNTER — Ambulatory Visit: Payer: Medicare Other | Admitting: Podiatry

## 2015-03-01 ENCOUNTER — Ambulatory Visit: Payer: Medicare Other | Admitting: Podiatry

## 2015-03-03 ENCOUNTER — Ambulatory Visit (INDEPENDENT_AMBULATORY_CARE_PROVIDER_SITE_OTHER): Payer: Medicare Other | Admitting: Podiatry

## 2015-03-03 ENCOUNTER — Encounter: Payer: Self-pay | Admitting: Podiatry

## 2015-03-03 DIAGNOSIS — H353221 Exudative age-related macular degeneration, left eye, with active choroidal neovascularization: Secondary | ICD-10-CM | POA: Diagnosis not present

## 2015-03-03 DIAGNOSIS — Q828 Other specified congenital malformations of skin: Secondary | ICD-10-CM | POA: Diagnosis not present

## 2015-03-03 DIAGNOSIS — G5762 Lesion of plantar nerve, left lower limb: Secondary | ICD-10-CM

## 2015-03-03 DIAGNOSIS — E119 Type 2 diabetes mellitus without complications: Secondary | ICD-10-CM | POA: Diagnosis not present

## 2015-03-03 DIAGNOSIS — D3613 Benign neoplasm of peripheral nerves and autonomic nervous system of lower limb, including hip: Secondary | ICD-10-CM

## 2015-03-03 DIAGNOSIS — H353113 Nonexudative age-related macular degeneration, right eye, advanced atrophic without subfoveal involvement: Secondary | ICD-10-CM | POA: Diagnosis not present

## 2015-03-03 NOTE — Progress Notes (Signed)
Subjective:     Patient ID: Katie Alvarado, female   DOB: 1925/07/30, 81 y.o.   MRN: [id number]  HPIThis patient presents to the office for continued treatment of her porokeratotic lesions both feet.  She says the calluses are painful walking and wearing her shoes.  She also says she is experiencing forefoot pain in her left foot.  She has no redness or swelling or history of injury .  She presents for evaluation and treatment. Patient is diabetic.   Review of Systems     Objective:   Physical Exam  Objective: Review of past medical history, medications, social history and allergies were performed.  Vascular: Dorsalis pedis and posterior tibial pulses were palpable B/L, capillary refill was  WNL B/L, temperature gradient was WNL B/L   Skin: Porokeratosis sub 4th left and sub 5th B/L  Nails: appear healthy with no signs of mycosis or infections  Sensory: Semmes Weinstein monifilament WNL   Orthopedic: Orthopedic evaluation demonstrates all joints distal t ankle have full ROM without crepitus, muscle power WNL B/L There is palkpable pain in third interspace left foot.  No signs of redness or swelling noted.     Assessment:     Porokeratosis  Neuroma left foot.     Plan:     Debridement of porokeratosis B/L  Injection therapy 3rd interspace left foot.

## 2015-03-14 DIAGNOSIS — H903 Sensorineural hearing loss, bilateral: Secondary | ICD-10-CM | POA: Diagnosis not present

## 2015-03-14 DIAGNOSIS — R634 Abnormal weight loss: Secondary | ICD-10-CM | POA: Diagnosis not present

## 2015-03-14 DIAGNOSIS — H9313 Tinnitus, bilateral: Secondary | ICD-10-CM | POA: Diagnosis not present

## 2015-03-14 DIAGNOSIS — N390 Urinary tract infection, site not specified: Secondary | ICD-10-CM | POA: Diagnosis not present

## 2015-03-14 DIAGNOSIS — J410 Simple chronic bronchitis: Secondary | ICD-10-CM | POA: Diagnosis not present

## 2015-03-14 DIAGNOSIS — H8143 Vertigo of central origin, bilateral: Secondary | ICD-10-CM | POA: Diagnosis not present

## 2015-03-14 DIAGNOSIS — R42 Dizziness and giddiness: Secondary | ICD-10-CM | POA: Diagnosis not present

## 2015-03-20 ENCOUNTER — Encounter: Payer: Self-pay | Admitting: Internal Medicine

## 2015-03-20 ENCOUNTER — Ambulatory Visit: Payer: Medicare Other | Admitting: Internal Medicine

## 2015-03-20 VITALS — BP 122/70 | HR 80 | Ht 68.0 in | Wt 152.2 lb

## 2015-03-20 DIAGNOSIS — J479 Bronchiectasis, uncomplicated: Secondary | ICD-10-CM

## 2015-03-20 MED ORDER — TRAMADOL HCL 50 MG PO TABS: 50.0000 mg | ORAL_TABLET | Freq: Four times a day (QID) | ORAL | Status: DC | PRN

## 2015-03-20 MED ORDER — METHYLPREDNISOLONE ACETATE 80 MG/ML IJ SUSP
80.0000 mg | Freq: Once | INTRAMUSCULAR | Status: AC
  Administered 2015-03-20: 80 mg via INTRAMUSCULAR

## 2015-03-20 MED ORDER — HYDROCOD POLST-CPM POLST ER 10-8 MG/5ML PO SUER: 5.0000 mL | Freq: Two times a day (BID) | ORAL | Status: DC | PRN

## 2015-03-20 MED ORDER — BENZONATATE 200 MG PO CAPS: 200.0000 mg | ORAL_CAPSULE | Freq: Three times a day (TID) | ORAL | Status: DC | PRN

## 2015-03-20 NOTE — Progress Notes (Signed)
04/09/11- 85 yoF never smoker, followed for bronchiectasis/ chronic bronchitis, complicated by hx of GERD, DM. LOV-May 15, 2010 Has had flu vax. We called in a Zpak for bronchitis in August - she says it helped. She complains of weakness "tired" over the past several weeks- nothing specific- but has difficulty walking around home. Was having left neck pain. Dr Ernesto Rutherford ENT gave antibiotic. Dr Lawernce Pitts gave prednisone taper, xray'd neck. She says prednisone helped her neck pain. Tried a heating pad but burned herself. Cough also improved after the prednisone.  Denies fever, but has had some sweat. Weight up, then down. No chest pain and little residual dry cough.  09/30/11- 85 yoF never smoker, followed for bronchiectasis/ chronic bronchitis, complicated by hx of GERD, DM  Patient still c/o cough with clear mucus and sob. She complains of persistent weakness such that it can be hard to get out of bed in the morning. Legs give out. She doesn't describe claudication or exertional chest pain. Weakness is more important than shortness of breath. Scant clear mucus.  CXR 04/17/11 reviewed with her. IMPRESSION:  Stable patchy parenchymal opacities as before. No acute or  superimposed abnormality.  Original Report Authenticated By: Trecia Rogers, M.D.    11/14/11- 81 yoF never smoker, followed for bronchiectasis/ chronic bronchitis, complicated by hx of GERD, DM Still prod cough w/ clear mucus, feels the biaxin did help.  Was given medrol dose pak by Dr Trudie Reed She started Medrol today. She is not focused on her breathing at this visit, being actively uncomfortable with low back and bilateral hip pains. Got a steroid injection yesterday and has tramadol but resists taking pain medications.  01/16/12- 85 yoF never smoker, followed for bronchiectasis/ chronic bronchitis, complicated by hx of GERD, DM Cough-productive-clear in color; wheezing, feels as though not moving enough air. Had flu  vaccine Increased cough with clear mucus. Sometimes back pain catches her breath. Pro air is not enough. Taking one half tramadol for cough or pain but it can make her queasy. Does have some Tussionex at home. Occasionally uses her nebulizer with ipratropium. She has been very sensitive to stimulants. Watery nose-prefers Kleenex over medication. Back pain drags her down- going to orthopedist.  05/28/12- 85 yoF never smoker, followed for bronchiectasis/ chronic bronchitis, complicated by hx of GERD, DM FOLLOWS FOR: unable to catch breath at times; wheezing, SOB, and cough--productive at times-clear in color. Ortho injected hip- limits activity less than when last here.Had dental extraction and on "penicillin" for UTI. Using neb atrovent. Clear sputum, chronic cough. Uses Proair but makes her shake. Considering cosmetic surgery.  06/09/12- 85 yoF never smoker, followed for bronchiectasis/ chronic bronchitis, complicated by hx of GERD, DM FOLLOWS FOR: Per CY-review CT results in person with patient She returns, with her granddaughter, at my request so that I can go over her CT scan. This shows significant fibrocavitary scarring and nodules which could be quite consistent with MAIC. She has a very long history of bronchiectasis which had been fairly stable in the left lower lobe for many years, but this is more extensive and bilateral. I do not get a history that she has been aspirating. She denies night sweats and coughs up almost nothing. CT chest 06/01/12- IMPRESSION:  Progressive bilateral nodular bronchiectasis. Associated nodules  have increased in size including mass-like nodule within the right  lower lobe measuring up to 1.2 cm. It is possible these changes  represent findings of Mycobacterium avium intracellulare. Tumor  difficult to exclude.  Prominent atherosclerotic type changes as detailed above.  This is a call report.  Original Report Authenticated By: Genia Del, M.D.  07/14/12-   85 yoF never smoker, followed for bronchiectasis/ chronic bronchitis/ MAIC, complicated by hx of GERD, DM   Start 07/14/12- Biaxin 500mg  TIW, EMB 1200 TIW, Rifabutin 300 TIW FOLLOWS FOR: review labs (sputum culture) in greater detail with patient; still having cough-productive-clear in color,; SOB and wheezing-worsened by activity. Chronic cough persists, scant clear mucus. Sometimes feels hot or cold with some night sweating. No blood, fever or swollen glands. Chronic back pain comes and goes. Sputum Cx 06/09/12 POS MAIC- discussed in detail with her, including discussion of medications. She has tried tramadol and Perles for cough.  08/31/12- 85 yoF never smoker, followed for bronchiectasis/ chronic bronchitis/ MAIC, complicated by hx of GERD, DM   Started 07/14/12- Biaxin 500mg  TIW, EMB 1200 TIW, Rifabutin 300 TIW  ended- 08/12/12 due to weakness and malaise.  FOLLOWS FOR: pt was put on abx's at last visit-pt states they made her sick. 2 hospital stays since 07-14-12 visit.  She says she is "sure" the triple therapy was helping her chest, but it made her too weak. She was hospitalized April 9 through April 12 because of weakness and then from April 14 - April 16 with urinary infection and diarrhea-negative for C. Difficile. She now has her persistent baseline cough, mostly dry with scant clear sputum. She denies sweats or fever but stays chilly. She mentions her poor teeth and is considering dental implants-teeth affect her chewing/nutrition. CXR 07/29/12 IMPRESSION:  1. No acute cardiopulmonary process  2. No significant interval change in the appearance of known  varicose bronchiectasis and scarring within the lingula and the  dependent aspects of the upper and lower lobes bilaterally.  Original Report Authenticated By: Jacqulynn Cadet, M.D.  09/25/12- 81 yoF never smoker, followed for bronchiectasis/ chronic bronchitis/ MAIC, complicated by hx of GERD, DM   Started 07/14/12- Biaxin 500mg  TIW, EMB 1200  TIW, Rifabutin 300 TIW  ended- 08/12/12 due to weakness and malaise FOLLOWS FOR: still continues to have cough-productive-clear in color; worse in the morning but lasts through the day  10/07/12- 85 yoF never smoker, followed for bronchiectasis/ chronic bronchitis/ MAIC, complicated by hx of GERD, DM   Started 07/14/12- Biaxin 500mg  TIW, EMB 1200 TIW, Rifabutin 300 TIW  ended- 08/12/12 due to weakness and malaise    Has grandson with her today. FOLLOWS FOR: continues to have cough-same as last OV-productive-clear in color; needs RX for Tussionex as she does not recall getting printed Rx at last OV. Also, would like  RX for Tramadol. Dr Gladstone Lighter treated prednisone taper for sore ankles. Cough pattern has not changed, reflecting her known bronchiectasis. Denies fever, blood, chest pain, purulent sputum.  01/18/13- 87 yoF never smoker, followed for bronchiectasis/ chronic bronchitis/ MAIC, complicated by hx of GERD, DM   Started 07/14/12- Biaxin 500mg  TIW, EMB 1200 TIW, Rifabutin 300 TIW  ended- 08/12/12 due to weakness and malaise  FOLLOWS FOR: surgery clearance for back surgery if cleared. Would like Proair HFA refill sent to [facility] on file. Waiting on availability of extra strength flu vaccine this fall. Pending low back surgery because of chronic pain. Denies change in her chronic cough with scant clear sputum. No chest pain, palpitation, fever or blood. Dyspnea on exertion but mobility is limited more by her back pain so she gets little exercise. Still trying to maintain her own home. Daughter coming up to stay for a  while. Office spirometry  01/18/13- moderate obstructive airways disease-FVC 2.10/69%, FEV1 1.25/58%,  FEV1/FVC  0.59/ 84%,  FEF 25-75% 0.47/33%  09/08/13- 68 yoF never smoker, followed for bronchiectasis/ chronic bronchitis/ MAIC, complicated by hx of GERD, DM   (Started 07/14/12- Biaxin 500mg  TIW, EMB 1200 TIW, Rifabutin 300 TIW  ended- 08/12/12 due to weakness and malaise) ACUTE  VISIT:weakness, SOB, and cough-non productive Seeing a chiropractor for back pain after L.-5 surgery. Dry cough. She had clean iron lawn furniture with a strong cleanser which increased her cough 3 days ago. Her primary physician today gave her vaccine Prevnar. She complains of feeling weak and asking for "a shot to make me feel better". We discussed steroids and her recent surgery/ osteoporosis. CXR 01/18/13 Heart size and pulmonary vascularity are normal. There are chronic  inflammatory changes in the right upper lung zone and in the left  mid and lower lung zones, essentially unchanged since the prior  studies consistent with the areas of bronchiectasis and chronic  inflammation noted on the prior chest x-rays and CT scan. There is  an area of chronic atelectasis and bronchiectasis in the lingula.  No acute infiltrates or effusions. No acute osseous abnormality.  IMPRESSION:  No acute abnormalities. Chronic changes in both lungs as described.  Electronically Signed  By: Rozetta Nunnery  On: 01/18/2013 17:38  11/08/13-  87 yoF never smoker, followed for bronchiectasis/ chronic bronchitis/ MAIC, complicated by hx of GERD, DM, arthritis   (Started 07/14/12- Biaxin 500mg  TIW, EMB 1200 TIW, Rifabutin 300 TIW FOLLOWS FOR:  Still having sob, weakness and cough non-productive states no better since last ov Cough chronic, clear mucus. Cough is tiring and hurts back. Dislikes tramadol. Perles no help, tussionnex too strong.   05/17/14- 87 yoF never smoker, followed for bronchiectasis/ chronic bronchitis/ MAIC, complicated by hx of GERD, DM, arthritis   (Started 07/14/12- Biaxin 500mg  TIW, EMB 1200 TIW, Rifabutin 300 TIW- stopped 2014 due to malaise. FOLLOW FOR:  Bronchiectasis; coughing up clear mucus; SOB; DOE Breathing/dyspnea/cough or stable but cough remains. Asks an inhaled medicine to help her cough out mucus-discussed. Delivery is delayed for her mail order nebulizer medications and we discussed trial  of Xopenex again. She has been extremely sensitive to stimulant medications.  11/15/14- 87 yoF never smoker, followed for bronchiectasis/ chronic bronchitis/ MAIC, complicated by hx of GERD, DM, arthritis, hyponatremia( SIADH?)   (Started 07/14/12- Biaxin 500mg  TIW, EMB 1200 TIW, Rifabutin 300 TIW- stopped 2014 due to malaise. FOLLOWS FOR: Went to [facility] sunday-was told today by them that her sodium level is low; pt remains weak for about 3 weeks now and was told to follow up with CY. Pt states she has gotten weaker since Sunday. SOB as well. No energy. She hasn't felt well for a long time-weakness and joint pains. Has had episodes of hyponatremia. She is now drinking Gatorade once daily and was taken off her diuretic. She will be seeing Dr. Rex Kras soon, but asked we go ahead and check her sodium level today. Not much cough or phlegm. Tussive tenderness in the last day or 2 left mid axillary rib. We reviewed chest x-ray image CXR- 05/17/14 IMPRESSION: 1. Spectrum of findings similar to numerous prior examinations, as above, suggestive of underlying MAI. 2. Atherosclerosis. Electronically Signed  By: Vinnie Langton M.D.  On: 05/17/2014 15:56  03/20/2015-81 year old female never smoker followed for bronchiectasis/chronic bronchitis/MAIC, complicated by history of GERD, DM, arthritis (Started 07/14/12- Biaxin 500mg  TIW, EMB 1200 TIW, Rifabutin 300 TIW- stopped 2014  due to malaise. FOLLOWS FOR: No change in cough and SOB since last OV - breathing is the same.    ROS-see HPI Constitutional:     No-weight loss, night sweats, no-fevers, chills,  +fatigue, lassitude. HEENT:   No-  headaches, difficulty swallowing, +tooth/dental problems, no-sore throat,       No-  sneezing, itching, ear ache, nasal congestion, post nasal drip,  CV:  No-   chest pain, orthopnea, PND, swelling in lower extremities, anasarca,  dizziness, palpitations Resp: +Mild  shortness of breath with exertion or at rest.              +productive cough,  + non-productive cough,  No- coughing up of blood.              No-   change in color of mucus.  No- wheezing.   Skin: No-   rash or lesions. GI:  No-   heartburn, indigestion, abdominal pain, nausea, vomiting,  GU:  MS:  +Active low back and bilateral hip pain Neuro-     complaint of generalized weakness without myalgias Psych:  No- change in mood or affect. No depression or anxiety.  No memory loss.  General- Alert, Oriented, Affect-appropriate, Distress- none acute. Looks well/neatly groomed. Skin- rash-none,  excoriation- none Lymphadenopathy- none Head- atraumatic            Eyes- Gross vision intact, PERRLA, conjunctivae clear secretions            Ears- Hearing, canals-normal            Nose- Clear, no-Septal dev, mucus, polyps, erosion, perforation             Throat- Mallampati II , mucosa very dry , drainage- none, tonsils- atrophic Neck- flexible , trachea midline, no stridor , thyroid nl, carotid no bruit Chest - symmetrical excursion , unlabored           Heart/CV- RRR , no murmur , no gallop  , no rub, nl s1 s2                           - JVD- none , edema- none, stasis changes- none, varices- none           Lung-   Cough-dry, + faint basilar crackles L>R  , unlabored, no wheeze , dullness-none, rub-none.  Chest wall- + may be minimal tenderness to firm pressure over a left mid axillary rib Abd-  Br/ Gen/ Rectal- Not done, not indicated Extrem-   No-clubbing, none, atrophy- none, strength- nl for age. + Cool/dusky feet Neuro- grossly intact to observation

## 2015-03-20 NOTE — Patient Instructions (Addendum)
Scripts printed refilling cough medicines  Please call if we can help  Depo [phone]

## 2015-03-23 DIAGNOSIS — Z7984 Long term (current) use of oral hypoglycemic drugs: Secondary | ICD-10-CM | POA: Diagnosis not present

## 2015-03-23 DIAGNOSIS — I10 Essential (primary) hypertension: Secondary | ICD-10-CM | POA: Diagnosis not present

## 2015-03-23 DIAGNOSIS — E119 Type 2 diabetes mellitus without complications: Secondary | ICD-10-CM | POA: Diagnosis not present

## 2015-03-27 ENCOUNTER — Other Ambulatory Visit: Payer: Self-pay | Admitting: Family Medicine

## 2015-03-27 DIAGNOSIS — L298 Other pruritus: Secondary | ICD-10-CM | POA: Diagnosis not present

## 2015-03-27 DIAGNOSIS — R300 Dysuria: Secondary | ICD-10-CM | POA: Diagnosis not present

## 2015-03-27 DIAGNOSIS — H6590 Unspecified nonsuppurative otitis media, unspecified ear: Secondary | ICD-10-CM | POA: Diagnosis not present

## 2015-03-27 DIAGNOSIS — I10 Essential (primary) hypertension: Secondary | ICD-10-CM

## 2015-03-31 ENCOUNTER — Ambulatory Visit
Admission: RE | Admit: 2015-03-31 | Discharge: 2015-03-31 | Disposition: A | Discharge status: Home / Self Care | Payer: Medicare Other | Source: Ambulatory Visit | Attending: Family Medicine | Admitting: Family Medicine

## 2015-03-31 DIAGNOSIS — I10 Essential (primary) hypertension: Secondary | ICD-10-CM | POA: Diagnosis not present

## 2015-04-04 DIAGNOSIS — H8143 Vertigo of central origin, bilateral: Secondary | ICD-10-CM | POA: Diagnosis not present

## 2015-04-04 DIAGNOSIS — H903 Sensorineural hearing loss, bilateral: Secondary | ICD-10-CM | POA: Diagnosis not present

## 2015-04-07 DIAGNOSIS — H353221 Exudative age-related macular degeneration, left eye, with active choroidal neovascularization: Secondary | ICD-10-CM | POA: Diagnosis not present

## 2015-04-07 DIAGNOSIS — H5213 Myopia, bilateral: Secondary | ICD-10-CM | POA: Diagnosis not present

## 2015-04-07 DIAGNOSIS — H53453 Other localized visual field defect, bilateral: Secondary | ICD-10-CM | POA: Diagnosis not present

## 2015-04-07 DIAGNOSIS — H16223 Keratoconjunctivitis sicca, not specified as Sjogren's, bilateral: Secondary | ICD-10-CM | POA: Diagnosis not present

## 2015-04-07 DIAGNOSIS — H52223 Regular astigmatism, bilateral: Secondary | ICD-10-CM | POA: Diagnosis not present

## 2015-04-07 DIAGNOSIS — H524 Presbyopia: Secondary | ICD-10-CM | POA: Diagnosis not present

## 2015-04-13 DIAGNOSIS — H353221 Exudative age-related macular degeneration, left eye, with active choroidal neovascularization: Secondary | ICD-10-CM | POA: Diagnosis not present

## 2015-04-25 ENCOUNTER — Encounter: Payer: Self-pay | Admitting: Cardiology

## 2015-04-25 ENCOUNTER — Ambulatory Visit (INDEPENDENT_AMBULATORY_CARE_PROVIDER_SITE_OTHER): Payer: Medicare Other | Admitting: Cardiology

## 2015-04-25 VITALS — BP 122/64 | HR 77 | Ht 68.0 in | Wt 154.0 lb

## 2015-04-25 DIAGNOSIS — I10 Essential (primary) hypertension: Secondary | ICD-10-CM | POA: Insufficient documentation

## 2015-04-25 DIAGNOSIS — J479 Bronchiectasis, uncomplicated: Secondary | ICD-10-CM | POA: Diagnosis not present

## 2015-04-25 DIAGNOSIS — I701 Atherosclerosis of renal artery: Secondary | ICD-10-CM | POA: Diagnosis not present

## 2015-04-25 DIAGNOSIS — R42 Dizziness and giddiness: Secondary | ICD-10-CM

## 2015-04-25 DIAGNOSIS — E871 Hypo-osmolality and hyponatremia: Secondary | ICD-10-CM

## 2015-04-25 MED ORDER — AMLODIPINE BESYLATE 2.5 MG PO TABS: 2.5000 mg | ORAL_TABLET | Freq: Every day | ORAL | Status: DC

## 2015-04-25 NOTE — Patient Instructions (Addendum)
Medication Instructions:  1)  Your physician would like for you to try spacing out your blood pressure medications. 2)  DECREASE Amlodipine to 2.5mg  once daily  Labwork: None  Testing/Procedures: None  Follow-Up: Your physician recommends that you schedule a follow-up appointment as needed with Dr. Marlou Porch.  You have been referred to Dr. Gwenlyn Found or Dr. Fletcher Anon (whoever has first available appt) for evaluation of renal artery.  Any Other Special Instructions Will Be Listed Below (If Applicable).     If you need a refill on your cardiac medications before your next appointment, please call your pharmacy.

## 2015-04-25 NOTE — Progress Notes (Signed)
Cardiology Office Note   Date:  04/25/2015   ID:  Katie Alvarado, DOB 1925-07-09, MRN [id number]  PCP:  Gennette Pac, MD  Cardiologist:   Candee Furbish, MD       History of Present Illness: Katie Alvarado is a 82 y.o. female who presents for evaluation of blood pressure. Labile. Possible renal artery stenosis  Suggested by renal duplex scan.   her blood pressure today is normal. She has noted some increased ankle swelling  When taking amlodipine.   In the past she has had hyponatremia when taking daily Maxide. She is now taking this every other day   He has been on chronic losartan.   Pressure one point in the 123456 systolic range.  Renal artery duplex as above suggested of renal artery stenosis.   Has hot flashes when going to urinate,  Possible vagal reaction.  No chest pain, no SOB. No syncope. Decreased energy.  Never smoked.  Mother lived to 52 - took HTN meds.     Past Medical History  Diagnosis Date  . Other symptoms involving nervous and musculoskeletal systems(781.99)   . Insomnia, unspecified   . Cough   . Bronchiectasis with acute exacerbation ([place])   . Acute nasopharyngitis (common cold)   . Esophageal reflux   . Hypertension   . Diabetes mellitus     fasting 130-150  . COPD (chronic obstructive pulmonary disease) ([place])     sees dr. Annamaria Boots   . Shortness of breath     exertion  . Pneumonia     hx of  . Dizziness   . Cancer ([place])     skin cancer  . Arthritis     Past Surgical History  Procedure Laterality Date  . Abdominal hysterectomy    . Appendectomy    . Colon surgery      colon resection for diverticulitis  . Ankle fracture surgery Left   . Lumbar laminectomy/decompression microdiscectomy N/A 02/15/2013    Procedure: LUMBAR LAMINECTOMY/DECOMPRESSION MICRODISCECTOMY LUMBAR FOUR-FIVE;  Surgeon: Otilio Connors, MD;  Location: [facility];  Service: Neurosurgery;  Laterality: N/A;  . Cataract extraction, bilateral      OUT PATIENT   . Basal cell carcinoma excision      NOSE  . Varicose vein surgery       Current Outpatient Prescriptions  Medication Sig Dispense Refill  . acetaminophen (TYLENOL) 325 MG tablet Take 2 tablets (650 mg total) by mouth every 6 (six) hours as needed for pain.    Marland Kitchen allopurinol (ZYLOPRIM) 100 MG tablet Take 1 tablet by mouth daily.    Marland Kitchen ALPRAZolam (XANAX) 0.25 MG tablet Take 0.25 mg by mouth 2 (two) times daily as needed for sleep or anxiety. For anxiety.    Marland Kitchen amLODipine (NORVASC) 5 MG tablet Take 5 mg by mouth daily.  12  . Artificial Tear Ointment (DRY EYES OP) Apply 1 drop to eye as needed (for dry eyes).    Marland Kitchen aspirin EC 81 MG tablet Take 81 mg by mouth every morning.    . benzonatate (TESSALON) 200 MG capsule Take 1 capsule (200 mg total) by mouth 3 (three) times daily as needed for cough. 30 capsule 1  . budesonide (PULMICORT) 0.25 MG/2ML nebulizer solution Take 2 mLs (0.25 mg total) by nebulization daily. 60 mL 12  . chlorpheniramine-HYDROcodone (TUSSIONEX PENNKINETIC ER) 10-8 MG/5ML SUER Take 5 mLs by mouth every 12 (twelve) hours as needed for cough. 115 mL 0  . cholecalciferol (VITAMIN  D) 1000 UNITS tablet Take 2,000 Units by mouth every morning.     . Cinnamon 500 MG capsule Take 500 mg by mouth every morning.     . cyclobenzaprine (FLEXERIL) 10 MG tablet Take 1 tablet (10 mg total) by mouth 3 (three) times daily as needed for muscle spasms. 50 tablet 2  . esomeprazole (NEXIUM) 40 MG capsule Take 40 mg by mouth every morning.     Marland Kitchen HYDROcodone-homatropine (HYCODAN) 5-1.5 MG/5ML syrup Take 5 mLs by mouth every 6 (six) hours as needed for cough. 180 mL 0  . ipratropium (ATROVENT) 0.02 % nebulizer solution Take 2.5 mLs (500 mcg total) by nebulization 4 (four) times daily. 360 mL 12  . losartan (COZAAR) 100 MG tablet Take 50 mg by mouth daily.    . meclizine (ANTIVERT) 25 MG tablet Take 25 mg by mouth every 6 (six) hours as needed for dizziness.     . Menthol, Topical Analgesic, (PAIN  RELIEVING PATCH) 5 % PADS Apply 1 patch topically as needed (for back pain).    . metFORMIN (GLUCOPHAGE) 500 MG tablet Take 750 mg by mouth 2 (two) times daily.     . Omega-3 Fatty Acids (FISH OIL) 1000 MG CAPS Take 1 capsule by mouth daily.    . ONE TOUCH ULTRA TEST test strip 1 strip by Other route daily. Use to check blood sugar once daily  5  . oxyCODONE-acetaminophen (PERCOCET/ROXICET) 5-325 MG per tablet Take 1-2 tablets by mouth every 4 (four) hours as needed for pain. 51 tablet 0  . PROAIR HFA 108 (90 BASE) MCG/ACT inhaler INHALE 2 PUFFS INTO THE LUNGS EVERY 6 HOURS AS NEEDED FOR WHEEZING OR FOR SHORTNESS OF BREATH 8.5 Inhaler 0  . traMADol (ULTRAM) 50 MG tablet Take 50 mg by mouth every 6 (six) hours as needed for moderate pain.    Marland Kitchen triamterene-hydrochlorothiazide (MAXZIDE) 75-50 MG per tablet Take 0.5 tablets by mouth every morning.     . vitamin E 400 UNIT capsule Take 400 Units by mouth daily.     No current facility-administered medications for this visit.    Allergies:   Welchol; Glucosamine; Tramadol; Celecoxib; and Statins    Social History:  The patient  reports that she has never smoked. She has never used smokeless tobacco. She reports that she drinks alcohol. She reports that she does not use illicit drugs.   Family History:  The patient's family history includes Chronic bronchitis in her father; Other in her brother and sister.    ROS:  Please see the history of present illness.   Otherwise, review of systems are positive for  Occasional dizziness, vertigo-like symptoms, sometimes excessive sweating, fatigue , cough , back pain.   All other systems are reviewed and negative.    PHYSICAL EXAM: VS:  BP 122/64 mmHg  Pulse 77  Ht 5\' 8"  (1.727 m)  Wt 154 lb (69.854 kg)  BMI 23.42 kg/m2 , BMI Body mass index is 23.42 kg/(m^2). GEN: Well nourished, well developed, in no acute distress , elderly HEENT: normal Neck: no JVD, carotid bruits, or masses Cardiac: RRR; no  murmurs, rubs, or gallops,no edema  Respiratory:  clear to auscultation bilaterally, normal work of breathing GI: soft, nontender, nondistended, + BS MS: no deformity or atrophy Skin: warm and dry, no rash Neuro:  Strength and sensation are intact Psych: euthymic mood, full affect   EKG:  EKG is ordered today. 04/25/15-The ekg ordered today demonstrates  Sinus rhythm, 77 with no other abnormalities.  Recent Labs: 11/15/2014: BUN 24*; Creatinine, Ser 0.99; Hemoglobin 12.3; Platelets 293.0; Potassium 4.0; Sodium 131*    Lipid Panel No results found for: CHOL, TRIG, HDL, CHOLHDL, VLDL, LDLCALC, LDLDIRECT    Wt Readings from Last 3 Encounters:  04/25/15 154 lb (69.854 kg)  03/20/15 152 lb 3.2 oz (69.037 kg)  11/15/14 153 lb 12.8 oz (69.763 kg)    Renal artery duplex: 03/31/15 IMPRESSION: 1. Elevated velocities in bilateral renal arteries suggesting hemodynamically significant stenosis. Consider catheter angiography for further evaluation, as the lesions may be amenable to percutaneous treatment for renovascular hypertension if confirmed to be significant.   Other studies Reviewed: Additional studies/ records that were reviewed today include:  Prior office notes from Dr. Rex Kras, lab work , EKG , renal duplex. Review of the above records demonstrates:  As above   ASSESSMENT AND PLAN:  Renal artery stenosis suggested by renal artery  Duplex ultrasound  -  Referred to  [facility] specialist for further evaluation and management   Labile hypertension  -  Her blood pressure has been challenging to control at times  -  She periodically takes her Maxide , was told not to take this on a daily basis because of her hyponatremia  -  She has noted amlodipine , ankle swelling with 5 mg so we will cut in half to 2.5 mg to see if this helps.  -  She may also consider spacing out her antihypertensives throughout the day to see if that helps smooth out her overall blood pressure  -  Dizziness has  been evaluated in the past by ENT , unremarkable  -  No previous signs of orthostatic hypotension.   hyponatremia  -  Last sodium 133   Bronchiectasis /chronic cough  -  Dr. Annamaria Boots has been managing   Diabetes type 2  -  Dr. Rex Kras has been managing   Vertigo/dizziness  -  Occasionally takes meclizine  -  Ry her ENT unremarkable   Current medicines are reviewed at length with the patient today.  The patient does not have concerns regarding medicines.  The following changes have been made:  Decreasing amlodipine to 2.5 mg because of ankle swelling.  Labs/ tests ordered today include:  none  No orders of the defined types were placed in this encounter.     Disposition:   FU with Shaan Rhoads PRN. Will  Have her seen by PV specialist.  Signed, Candee Furbish, MD  04/25/2015 9:51 AM    [address], [place], [place]  [address] Phone: [phone]; Fax: [phone]

## 2015-04-28 ENCOUNTER — Encounter: Payer: Self-pay | Admitting: Cardiology

## 2015-05-09 ENCOUNTER — Encounter: Payer: Self-pay | Admitting: Cardiovascular Disease

## 2015-05-09 ENCOUNTER — Ambulatory Visit (INDEPENDENT_AMBULATORY_CARE_PROVIDER_SITE_OTHER): Payer: Medicare Other | Admitting: Cardiovascular Disease

## 2015-05-09 VITALS — BP 126/74 | HR 80 | Ht 68.0 in | Wt 154.0 lb

## 2015-05-09 DIAGNOSIS — I701 Atherosclerosis of renal artery: Secondary | ICD-10-CM | POA: Diagnosis not present

## 2015-05-09 DIAGNOSIS — I10 Essential (primary) hypertension: Secondary | ICD-10-CM | POA: Diagnosis not present

## 2015-05-09 NOTE — Patient Instructions (Signed)
Medication Instructions:  Your physician recommends that you continue on your current medications as directed. Please refer to the Current Medication list given to you today.  Labwork: No new orders.   Testing/Procedures: No new orders.   Follow-Up: Your physician recommends that you schedule a follow-up appointment as needed with Dr Arida.    Any Other Special Instructions Will Be Listed Below (If Applicable).     If you need a refill on your cardiac medications before your next appointment, please call your pharmacy.   

## 2015-05-09 NOTE — Assessment & Plan Note (Addendum)
Blood pressure is reasonably controlled on current medications. If additional blood pressure control is needed, losartan can be increased. A small dose beta blocker such as carvedilol can be added but at the present time that's not indicated.

## 2015-05-09 NOTE — Progress Notes (Signed)
Primary care physician: Dr. Hulan Fess  HPI  This is a pleasant 82 year old female who was referred for renal artery stenosis. She reports being diagnosed with hypertension 5 years ago. She also has type 2 diabetes for at least 15 years and has bronchiectasis. She never smoked in the past. Blood pressure has been reasonably controlled on 3 blood pressure medications. Amlodipine was decreased to 2.5 mg once daily due to leg edema. The situation has improved since then. She does take Maxzide every other day as well as losartan 50 mg once daily. Most recent creatinine 6 months ago was 0.99. Previous creatinine 2014 was 0.8. She has no history of congestive heart failure. She had renal artery duplex in December which was suggestive of bilateral renal artery stenosis. However, peak velocity was less than 200 in both sides and renal to aortic ratio was borderline elevated at 3.5.  Allergies  Allergen Reactions  . Welchol [Colesevelam Hcl] Hives  . Glucosamine Other (See Comments)    Unknown  . Tramadol Nausea Only  . Celecoxib Hives, Itching and Rash  . Statins Rash     Current Outpatient Prescriptions on File Prior to Visit  Medication Sig Dispense Refill  . acetaminophen (TYLENOL) 325 MG tablet Take 2 tablets (650 mg total) by mouth every 6 (six) hours as needed for pain.    Marland Kitchen allopurinol (ZYLOPRIM) 100 MG tablet Take 1 tablet by mouth daily.    Marland Kitchen ALPRAZolam (XANAX) 0.25 MG tablet Take 0.25 mg by mouth 2 (two) times daily as needed for sleep or anxiety. For anxiety.    Marland Kitchen amLODipine (NORVASC) 2.5 MG tablet Take 1 tablet (2.5 mg total) by mouth daily. 90 tablet 3  . Artificial Tear Ointment (DRY EYES OP) Apply 1 drop to eye as needed (for dry eyes).    Marland Kitchen aspirin EC 81 MG tablet Take 81 mg by mouth every morning.    . benzonatate (TESSALON) 200 MG capsule Take 1 capsule (200 mg total) by mouth 3 (three) times daily as needed for cough. 30 capsule 1  . budesonide (PULMICORT) 0.25 MG/2ML  nebulizer solution Take 2 mLs (0.25 mg total) by nebulization daily. 60 mL 12  . chlorpheniramine-HYDROcodone (TUSSIONEX PENNKINETIC ER) 10-8 MG/5ML SUER Take 5 mLs by mouth every 12 (twelve) hours as needed for cough. 115 mL 0  . cholecalciferol (VITAMIN D) 1000 UNITS tablet Take 2,000 Units by mouth every morning.     . Cinnamon 500 MG capsule Take 500 mg by mouth every morning.     . cyclobenzaprine (FLEXERIL) 10 MG tablet Take 1 tablet (10 mg total) by mouth 3 (three) times daily as needed for muscle spasms. 50 tablet 2  . esomeprazole (NEXIUM) 40 MG capsule Take 40 mg by mouth every morning.     Marland Kitchen HYDROcodone-homatropine (HYCODAN) 5-1.5 MG/5ML syrup Take 5 mLs by mouth every 6 (six) hours as needed for cough. 180 mL 0  . ipratropium (ATROVENT) 0.02 % nebulizer solution Take 2.5 mLs (500 mcg total) by nebulization 4 (four) times daily. 360 mL 12  . losartan (COZAAR) 100 MG tablet Take 50 mg by mouth daily.    . meclizine (ANTIVERT) 25 MG tablet Take 25 mg by mouth every 6 (six) hours as needed for dizziness.     . metFORMIN (GLUCOPHAGE) 500 MG tablet Take 750 mg by mouth 2 (two) times daily.     . Omega-3 Fatty Acids (FISH OIL) 1000 MG CAPS Take 1 capsule by mouth daily.    Marland Kitchen  oxyCODONE-acetaminophen (PERCOCET/ROXICET) 5-325 MG per tablet Take 1-2 tablets by mouth every 4 (four) hours as needed for pain. 51 tablet 0  . PROAIR HFA 108 (90 BASE) MCG/ACT inhaler INHALE 2 PUFFS INTO THE LUNGS EVERY 6 HOURS AS NEEDED FOR WHEEZING OR FOR SHORTNESS OF BREATH 8.5 Inhaler 0  . traMADol (ULTRAM) 50 MG tablet Take 50 mg by mouth every 6 (six) hours as needed for moderate pain.    Marland Kitchen triamterene-hydrochlorothiazide (MAXZIDE) 75-50 MG per tablet Take 0.5 tablets by mouth every other day.     . vitamin E 400 UNIT capsule Take 400 Units by mouth daily.     No current facility-administered medications on file prior to visit.     Past Medical History  Diagnosis Date  . Other symptoms involving nervous and  musculoskeletal systems(781.99)   . Insomnia, unspecified   . Cough   . Bronchiectasis with acute exacerbation ([place])   . Acute nasopharyngitis (common cold)   . Esophageal reflux   . Hypertension   . Diabetes mellitus     fasting 130-150  . COPD (chronic obstructive pulmonary disease) ([place])     sees dr. Annamaria Boots   . Shortness of breath     exertion  . Pneumonia     hx of  . Dizziness   . Cancer ([place])     skin cancer  . Arthritis      Past Surgical History  Procedure Laterality Date  . Abdominal hysterectomy    . Appendectomy    . Colon surgery      colon resection for diverticulitis  . Ankle fracture surgery Left   . Lumbar laminectomy/decompression microdiscectomy N/A 02/15/2013    Procedure: LUMBAR LAMINECTOMY/DECOMPRESSION MICRODISCECTOMY LUMBAR FOUR-FIVE;  Surgeon: Otilio Connors, MD;  Location: [place] [facility];  Service: Neurosurgery;  Laterality: N/A;  . Cataract extraction, bilateral      OUT PATIENT  . Basal cell carcinoma excision      NOSE  . Varicose vein surgery       Family History  Problem Relation Age of Onset  . Chronic bronchitis Father   . Other Sister     heart trouble  . Other Brother     heart trouble     Social History   Social History  . Marital Status: Widowed    Spouse Name: N/A  . Number of Children: 2  . Years of Education: N/A   Occupational History  . Not on file.   Social History Main Topics  . Smoking status: Never Smoker   . Smokeless tobacco: Never Used  . Alcohol Use: Yes     Comment: occasional  . Drug Use: No  . Sexual Activity: Yes    Birth Control/ Protection: Post-menopausal   Other Topics Concern  . Not on file   Social History Narrative     ROS A 10 point review of system was performed. It is negative other than that mentioned in the history of present illness.   PHYSICAL EXAM   BP 126/74 mmHg  Pulse 80  Ht 5\' 8"  (1.727 m)  Wt 154 lb (69.854 kg)  BMI 23.42 kg/m2  Constitutional: She is oriented  to person, place, and time. She appears well-developed and well-nourished. No distress.  HENT: No nasal discharge.  Head: Normocephalic and atraumatic.  Eyes: Pupils are equal and round. No discharge.  Neck: Normal range of motion. Neck supple. No JVD present. No thyromegaly present.  Cardiovascular: Normal rate, regular rhythm, normal heart sounds. Exam  reveals no gallop and no friction rub. No murmur heard.  Pulmonary/Chest: Effort normal and breath sounds normal. No stridor. No respiratory distress. She has no wheezes. She has no rales. She exhibits no tenderness.  Abdominal: Soft. Bowel sounds are normal. She exhibits no distension. There is no tenderness. There is no rebound and no guarding.  Musculoskeletal: Normal range of motion. She exhibits no edema and no tenderness.  Neurological: She is alert and oriented to person, place, and time. Coordination normal.  Skin: Skin is warm and dry. No rash noted. She is not diaphoretic. No erythema. No pallor.  Psychiatric: She has a normal mood and affect. Her behavior is normal. Judgment and thought content normal.      ASSESSMENT AND PLAN

## 2015-05-09 NOTE — Assessment & Plan Note (Signed)
I personally reviewed the patient's renal artery duplex. Peak velocity was less than 200 bilaterally. This is suggestive of moderate bilateral renal artery stenosis that most likely is not hemodynamically significant. Even if the patient has significant renal artery stenosis, there is currently no clinical indication for revascularization. Current indications for renal artery revascularization include: Refractory hypertension with inability to control blood pressure in spite of 3 maximally tolerated blood pressure medications, progressive chronic kidney disease suggestive of ischemic nephropathy or heart failure due to flash pulmonary edema and not due to other reasons. The patient has none of the above indications and thus at the present time I recommend continuing medical therapy with monitoring of blood pressure and kidney disease. If there is a change in clinical condition, the patient can follow-up with me to consider angiography and possible revascularization.

## 2015-05-18 DIAGNOSIS — M549 Dorsalgia, unspecified: Secondary | ICD-10-CM | POA: Diagnosis not present

## 2015-05-18 DIAGNOSIS — E119 Type 2 diabetes mellitus without complications: Secondary | ICD-10-CM | POA: Diagnosis not present

## 2015-05-18 DIAGNOSIS — I10 Essential (primary) hypertension: Secondary | ICD-10-CM | POA: Diagnosis not present

## 2015-05-18 DIAGNOSIS — M79673 Pain in unspecified foot: Secondary | ICD-10-CM | POA: Diagnosis not present

## 2015-06-06 DIAGNOSIS — H353223 Exudative age-related macular degeneration, left eye, with inactive scar: Secondary | ICD-10-CM | POA: Diagnosis not present

## 2015-06-06 DIAGNOSIS — H353114 Nonexudative age-related macular degeneration, right eye, advanced atrophic with subfoveal involvement: Secondary | ICD-10-CM | POA: Diagnosis not present

## 2015-06-06 DIAGNOSIS — H43813 Vitreous degeneration, bilateral: Secondary | ICD-10-CM | POA: Diagnosis not present

## 2015-06-07 ENCOUNTER — Ambulatory Visit: Payer: Medicare Other | Admitting: Podiatry

## 2015-06-09 DIAGNOSIS — M4806 Spinal stenosis, lumbar region: Secondary | ICD-10-CM | POA: Diagnosis not present

## 2015-06-09 DIAGNOSIS — M7061 Trochanteric bursitis, right hip: Secondary | ICD-10-CM | POA: Diagnosis not present

## 2015-06-28 DIAGNOSIS — N390 Urinary tract infection, site not specified: Secondary | ICD-10-CM | POA: Diagnosis not present

## 2015-07-05 DIAGNOSIS — M7742 Metatarsalgia, left foot: Secondary | ICD-10-CM | POA: Diagnosis not present

## 2015-07-05 DIAGNOSIS — M2042 Other hammer toe(s) (acquired), left foot: Secondary | ICD-10-CM | POA: Diagnosis not present

## 2015-07-07 ENCOUNTER — Other Ambulatory Visit: Payer: Self-pay | Admitting: Pulmonary Disease

## 2015-07-17 ENCOUNTER — Encounter (HOSPITAL_COMMUNITY): Payer: Self-pay | Admitting: Family Medicine

## 2015-07-17 ENCOUNTER — Emergency Department (HOSPITAL_COMMUNITY): Payer: Medicare Other

## 2015-07-17 ENCOUNTER — Inpatient Hospital Stay (HOSPITAL_COMMUNITY)
Admission: EM | Admit: 2015-07-17 | Discharge: 2015-07-20 | DRG: 191 | Disposition: A | Discharge status: Home Health | Payer: Medicare Other | Attending: Internal Medicine | Admitting: Internal Medicine

## 2015-07-17 DIAGNOSIS — J470 Bronchiectasis with acute lower respiratory infection: Principal | ICD-10-CM | POA: Diagnosis present

## 2015-07-17 DIAGNOSIS — T7840XA Allergy, unspecified, initial encounter: Secondary | ICD-10-CM | POA: Diagnosis not present

## 2015-07-17 DIAGNOSIS — E119 Type 2 diabetes mellitus without complications: Secondary | ICD-10-CM | POA: Diagnosis not present

## 2015-07-17 DIAGNOSIS — Z792 Long term (current) use of antibiotics: Secondary | ICD-10-CM

## 2015-07-17 DIAGNOSIS — R531 Weakness: Secondary | ICD-10-CM | POA: Diagnosis not present

## 2015-07-17 DIAGNOSIS — I10 Essential (primary) hypertension: Secondary | ICD-10-CM | POA: Diagnosis present

## 2015-07-17 DIAGNOSIS — R339 Retention of urine, unspecified: Secondary | ICD-10-CM | POA: Diagnosis present

## 2015-07-17 DIAGNOSIS — F419 Anxiety disorder, unspecified: Secondary | ICD-10-CM | POA: Diagnosis present

## 2015-07-17 DIAGNOSIS — J189 Pneumonia, unspecified organism: Secondary | ICD-10-CM | POA: Diagnosis not present

## 2015-07-17 DIAGNOSIS — E871 Hypo-osmolality and hyponatremia: Secondary | ICD-10-CM | POA: Diagnosis present

## 2015-07-17 DIAGNOSIS — Z79899 Other long term (current) drug therapy: Secondary | ICD-10-CM

## 2015-07-17 DIAGNOSIS — Z7951 Long term (current) use of inhaled steroids: Secondary | ICD-10-CM

## 2015-07-17 DIAGNOSIS — A319 Mycobacterial infection, unspecified: Secondary | ICD-10-CM | POA: Diagnosis not present

## 2015-07-17 DIAGNOSIS — Z825 Family history of asthma and other chronic lower respiratory diseases: Secondary | ICD-10-CM

## 2015-07-17 DIAGNOSIS — A310 Pulmonary mycobacterial infection: Secondary | ICD-10-CM | POA: Diagnosis not present

## 2015-07-17 DIAGNOSIS — R05 Cough: Secondary | ICD-10-CM | POA: Diagnosis not present

## 2015-07-17 DIAGNOSIS — L270 Generalized skin eruption due to drugs and medicaments taken internally: Secondary | ICD-10-CM | POA: Diagnosis present

## 2015-07-17 DIAGNOSIS — T364X5A Adverse effect of tetracyclines, initial encounter: Secondary | ICD-10-CM | POA: Diagnosis present

## 2015-07-17 DIAGNOSIS — Z888 Allergy status to other drugs, medicaments and biological substances status: Secondary | ICD-10-CM

## 2015-07-17 DIAGNOSIS — R627 Adult failure to thrive: Secondary | ICD-10-CM | POA: Diagnosis present

## 2015-07-17 DIAGNOSIS — R300 Dysuria: Secondary | ICD-10-CM | POA: Diagnosis not present

## 2015-07-17 DIAGNOSIS — J471 Bronchiectasis with (acute) exacerbation: Secondary | ICD-10-CM | POA: Diagnosis present

## 2015-07-17 DIAGNOSIS — M1990 Unspecified osteoarthritis, unspecified site: Secondary | ICD-10-CM | POA: Diagnosis present

## 2015-07-17 DIAGNOSIS — D649 Anemia, unspecified: Secondary | ICD-10-CM | POA: Diagnosis not present

## 2015-07-17 DIAGNOSIS — Z7984 Long term (current) use of oral hypoglycemic drugs: Secondary | ICD-10-CM

## 2015-07-17 DIAGNOSIS — Z87440 Personal history of urinary (tract) infections: Secondary | ICD-10-CM

## 2015-07-17 DIAGNOSIS — G4700 Insomnia, unspecified: Secondary | ICD-10-CM | POA: Diagnosis present

## 2015-07-17 DIAGNOSIS — R404 Transient alteration of awareness: Secondary | ICD-10-CM | POA: Diagnosis not present

## 2015-07-17 LAB — CBC WITH DIFFERENTIAL/PLATELET
BASOS ABS: 0.1 10*3/uL (ref 0.0–0.1)
BASOS PCT: 1 %
Eosinophils Absolute: 0.3 10*3/uL (ref 0.0–0.7)
Eosinophils Relative: 4 %
HEMATOCRIT: 33.6 % — AB (ref 36.0–46.0)
HEMOGLOBIN: 11.4 g/dL — AB (ref 12.0–15.0)
Lymphocytes Relative: 31 %
Lymphs Abs: 2.1 10*3/uL (ref 0.7–4.0)
MCH: 29.4 pg (ref 26.0–34.0)
MCHC: 33.9 g/dL (ref 30.0–36.0)
MCV: 86.6 fL (ref 78.0–100.0)
Monocytes Absolute: 1.1 10*3/uL — ABNORMAL HIGH (ref 0.1–1.0)
Monocytes Relative: 16 %
NEUTROS ABS: 3.3 10*3/uL (ref 1.7–7.7)
NEUTROS PCT: 48 %
Platelets: 274 10*3/uL (ref 150–400)
RBC: 3.88 MIL/uL (ref 3.87–5.11)
RDW: 13.1 % (ref 11.5–15.5)
WBC: 6.9 10*3/uL (ref 4.0–10.5)

## 2015-07-17 LAB — URINALYSIS, ROUTINE W REFLEX MICROSCOPIC
BILIRUBIN URINE: NEGATIVE
Glucose, UA: NEGATIVE mg/dL
Hgb urine dipstick: NEGATIVE
Ketones, ur: NEGATIVE mg/dL
LEUKOCYTES UA: NEGATIVE
NITRITE: NEGATIVE
Protein, ur: NEGATIVE mg/dL
SPECIFIC GRAVITY, URINE: 1.013 (ref 1.005–1.030)
pH: 6.5 (ref 5.0–8.0)

## 2015-07-17 LAB — I-STAT CG4 LACTIC ACID, ED: Lactic Acid, Venous: 1.25 mmol/L (ref 0.5–2.0)

## 2015-07-17 NOTE — ED Notes (Signed)
Bed: WA24 Expected date:  Expected time:  Means of arrival:  Comments: weakness 

## 2015-07-17 NOTE — ED Notes (Signed)
Patient transported to X-ray 

## 2015-07-17 NOTE — ED Notes (Signed)
Patient is from home but was at [facility] where EMS transported her from. Pt is complaining of generalized weakness for 2 weeks with difficulty urinating 5 days.

## 2015-07-18 ENCOUNTER — Emergency Department (HOSPITAL_COMMUNITY): Payer: Medicare Other

## 2015-07-18 ENCOUNTER — Other Ambulatory Visit: Payer: Self-pay | Admitting: *Deleted

## 2015-07-18 DIAGNOSIS — Z87440 Personal history of urinary (tract) infections: Secondary | ICD-10-CM

## 2015-07-18 DIAGNOSIS — Z7984 Long term (current) use of oral hypoglycemic drugs: Secondary | ICD-10-CM | POA: Diagnosis not present

## 2015-07-18 DIAGNOSIS — J471 Bronchiectasis with (acute) exacerbation: Secondary | ICD-10-CM | POA: Diagnosis not present

## 2015-07-18 DIAGNOSIS — F419 Anxiety disorder, unspecified: Secondary | ICD-10-CM | POA: Diagnosis present

## 2015-07-18 DIAGNOSIS — B9689 Other specified bacterial agents as the cause of diseases classified elsewhere: Secondary | ICD-10-CM | POA: Diagnosis not present

## 2015-07-18 DIAGNOSIS — A319 Mycobacterial infection, unspecified: Secondary | ICD-10-CM | POA: Diagnosis not present

## 2015-07-18 DIAGNOSIS — E871 Hypo-osmolality and hyponatremia: Secondary | ICD-10-CM | POA: Diagnosis present

## 2015-07-18 DIAGNOSIS — G4700 Insomnia, unspecified: Secondary | ICD-10-CM | POA: Diagnosis present

## 2015-07-18 DIAGNOSIS — L270 Generalized skin eruption due to drugs and medicaments taken internally: Secondary | ICD-10-CM | POA: Diagnosis present

## 2015-07-18 DIAGNOSIS — T887XXA Unspecified adverse effect of drug or medicament, initial encounter: Secondary | ICD-10-CM

## 2015-07-18 DIAGNOSIS — Z792 Long term (current) use of antibiotics: Secondary | ICD-10-CM | POA: Diagnosis not present

## 2015-07-18 DIAGNOSIS — M1990 Unspecified osteoarthritis, unspecified site: Secondary | ICD-10-CM | POA: Diagnosis present

## 2015-07-18 DIAGNOSIS — Z79899 Other long term (current) drug therapy: Secondary | ICD-10-CM | POA: Diagnosis not present

## 2015-07-18 DIAGNOSIS — N390 Urinary tract infection, site not specified: Secondary | ICD-10-CM | POA: Diagnosis not present

## 2015-07-18 DIAGNOSIS — E119 Type 2 diabetes mellitus without complications: Secondary | ICD-10-CM | POA: Diagnosis present

## 2015-07-18 DIAGNOSIS — R339 Retention of urine, unspecified: Secondary | ICD-10-CM | POA: Diagnosis present

## 2015-07-18 DIAGNOSIS — I10 Essential (primary) hypertension: Secondary | ICD-10-CM | POA: Diagnosis not present

## 2015-07-18 DIAGNOSIS — R531 Weakness: Secondary | ICD-10-CM | POA: Diagnosis not present

## 2015-07-18 DIAGNOSIS — Z825 Family history of asthma and other chronic lower respiratory diseases: Secondary | ICD-10-CM | POA: Diagnosis not present

## 2015-07-18 DIAGNOSIS — J470 Bronchiectasis with acute lower respiratory infection: Secondary | ICD-10-CM | POA: Diagnosis present

## 2015-07-18 DIAGNOSIS — R627 Adult failure to thrive: Secondary | ICD-10-CM | POA: Diagnosis present

## 2015-07-18 DIAGNOSIS — J189 Pneumonia, unspecified organism: Secondary | ICD-10-CM | POA: Diagnosis not present

## 2015-07-18 DIAGNOSIS — R05 Cough: Secondary | ICD-10-CM | POA: Diagnosis not present

## 2015-07-18 DIAGNOSIS — D649 Anemia, unspecified: Secondary | ICD-10-CM | POA: Diagnosis not present

## 2015-07-18 DIAGNOSIS — Z7951 Long term (current) use of inhaled steroids: Secondary | ICD-10-CM | POA: Diagnosis not present

## 2015-07-18 DIAGNOSIS — Z888 Allergy status to other drugs, medicaments and biological substances status: Secondary | ICD-10-CM | POA: Diagnosis not present

## 2015-07-18 DIAGNOSIS — A310 Pulmonary mycobacterial infection: Secondary | ICD-10-CM | POA: Diagnosis not present

## 2015-07-18 DIAGNOSIS — T364X5A Adverse effect of tetracyclines, initial encounter: Secondary | ICD-10-CM | POA: Diagnosis present

## 2015-07-18 LAB — COMPREHENSIVE METABOLIC PANEL
ALK PHOS: 50 U/L (ref 38–126)
ALT: 13 U/L — AB (ref 14–54)
ANION GAP: 12 (ref 5–15)
AST: 18 U/L (ref 15–41)
Albumin: 3.6 g/dL (ref 3.5–5.0)
BUN: 22 mg/dL — ABNORMAL HIGH (ref 6–20)
CALCIUM: 9.4 mg/dL (ref 8.9–10.3)
CO2: 23 mmol/L (ref 22–32)
CREATININE: 0.64 mg/dL (ref 0.44–1.00)
Chloride: 98 mmol/L — ABNORMAL LOW (ref 101–111)
Glucose, Bld: 131 mg/dL — ABNORMAL HIGH (ref 65–99)
Potassium: 3.8 mmol/L (ref 3.5–5.1)
SODIUM: 133 mmol/L — AB (ref 135–145)
TOTAL PROTEIN: 6.5 g/dL (ref 6.5–8.1)
Total Bilirubin: 0.4 mg/dL (ref 0.3–1.2)

## 2015-07-18 LAB — GLUCOSE, CAPILLARY
GLUCOSE-CAPILLARY: 127 mg/dL — AB (ref 65–99)
GLUCOSE-CAPILLARY: 115 mg/dL — AB (ref 65–99)
GLUCOSE-CAPILLARY: 158 mg/dL — AB (ref 65–99)
Glucose-Capillary: 121 mg/dL — ABNORMAL HIGH (ref 65–99)

## 2015-07-18 LAB — I-STAT TROPONIN, ED: TROPONIN I, POC: 0.01 ng/mL (ref 0.00–0.08)

## 2015-07-18 LAB — LIPASE, BLOOD: LIPASE: 39 U/L (ref 11–51)

## 2015-07-18 MED ORDER — ALLOPURINOL 100 MG PO TABS
100.0000 mg | ORAL_TABLET | Freq: Every day | ORAL | Status: DC
  Administered 2015-07-18 – 2015-07-20 (×3): 100 mg via ORAL
  Filled 2015-07-18 (×3): qty 1

## 2015-07-18 MED ORDER — DEXTROSE 5 % IV SOLN
2.0000 g | Freq: Once | INTRAVENOUS | Status: AC
  Administered 2015-07-18: 2 g via INTRAVENOUS
  Filled 2015-07-18: qty 2

## 2015-07-18 MED ORDER — IPRATROPIUM-ALBUTEROL 0.5-2.5 (3) MG/3ML IN SOLN
3.0000 mL | RESPIRATORY_TRACT | Status: DC
  Administered 2015-07-18 (×5): 3 mL via RESPIRATORY_TRACT
  Filled 2015-07-18 (×4): qty 3

## 2015-07-18 MED ORDER — SODIUM CHLORIDE 0.9% FLUSH
3.0000 mL | Freq: Two times a day (BID) | INTRAVENOUS | Status: DC
  Administered 2015-07-20: 3 mL via INTRAVENOUS

## 2015-07-18 MED ORDER — ASPIRIN EC 81 MG PO TBEC
81.0000 mg | DELAYED_RELEASE_TABLET | Freq: Every morning | ORAL | Status: DC
  Administered 2015-07-18 – 2015-07-20 (×3): 81 mg via ORAL
  Filled 2015-07-18 (×3): qty 1

## 2015-07-18 MED ORDER — AMLODIPINE BESYLATE 2.5 MG PO TABS
2.5000 mg | ORAL_TABLET | Freq: Every day | ORAL | Status: DC
  Administered 2015-07-18 – 2015-07-20 (×3): 2.5 mg via ORAL
  Filled 2015-07-18 (×3): qty 1

## 2015-07-18 MED ORDER — PANTOPRAZOLE SODIUM 40 MG PO TBEC
80.0000 mg | DELAYED_RELEASE_TABLET | Freq: Every day | ORAL | Status: DC
  Administered 2015-07-18 – 2015-07-20 (×3): 80 mg via ORAL
  Filled 2015-07-18 (×4): qty 2

## 2015-07-18 MED ORDER — ACETAMINOPHEN 325 MG PO TABS
650.0000 mg | ORAL_TABLET | Freq: Four times a day (QID) | ORAL | Status: DC | PRN
  Administered 2015-07-18 – 2015-07-19 (×3): 650 mg via ORAL
  Filled 2015-07-18 (×4): qty 2

## 2015-07-18 MED ORDER — HYDROCORTISONE 1 % EX CREA
TOPICAL_CREAM | Freq: Two times a day (BID) | CUTANEOUS | Status: DC | PRN
  Filled 2015-07-18: qty 28

## 2015-07-18 MED ORDER — ONDANSETRON HCL 4 MG/2ML IJ SOLN: 4.0000 mg | Freq: Four times a day (QID) | INTRAMUSCULAR | Status: DC | PRN

## 2015-07-18 MED ORDER — VITAMIN E 180 MG (400 UNIT) PO CAPS
400.0000 [IU] | ORAL_CAPSULE | Freq: Every day | ORAL | Status: DC
  Administered 2015-07-18 – 2015-07-20 (×3): 400 [IU] via ORAL
  Filled 2015-07-18 (×3): qty 1

## 2015-07-18 MED ORDER — BUDESONIDE 0.25 MG/2ML IN SUSP
0.2500 mg | Freq: Every day | RESPIRATORY_TRACT | Status: DC
  Administered 2015-07-18 – 2015-07-20 (×3): 0.25 mg via RESPIRATORY_TRACT
  Filled 2015-07-18 (×3): qty 2

## 2015-07-18 MED ORDER — DEXTROSE 5 % IV SOLN
500.0000 mg | INTRAVENOUS | Status: DC
  Administered 2015-07-18 – 2015-07-19 (×2): 500 mg via INTRAVENOUS
  Filled 2015-07-18 (×2): qty 500

## 2015-07-18 MED ORDER — HYDROCOD POLST-CPM POLST ER 10-8 MG/5ML PO SUER
5.0000 mL | Freq: Two times a day (BID) | ORAL | Status: DC | PRN
  Administered 2015-07-18 – 2015-07-20 (×3): 5 mL via ORAL
  Filled 2015-07-18 (×3): qty 5

## 2015-07-18 MED ORDER — TRAMADOL HCL 50 MG PO TABS
50.0000 mg | ORAL_TABLET | Freq: Four times a day (QID) | ORAL | Status: DC | PRN
  Administered 2015-07-20: 50 mg via ORAL
  Filled 2015-07-18: qty 1

## 2015-07-18 MED ORDER — LOSARTAN POTASSIUM 50 MG PO TABS
50.0000 mg | ORAL_TABLET | Freq: Every day | ORAL | Status: DC
  Administered 2015-07-18 – 2015-07-20 (×3): 50 mg via ORAL
  Filled 2015-07-18 (×3): qty 1

## 2015-07-18 MED ORDER — BENZONATATE 100 MG PO CAPS
200.0000 mg | ORAL_CAPSULE | Freq: Three times a day (TID) | ORAL | Status: DC | PRN
  Administered 2015-07-20: 200 mg via ORAL
  Filled 2015-07-18: qty 2

## 2015-07-18 MED ORDER — SODIUM CHLORIDE 0.9 % IV SOLN
INTRAVENOUS | Status: AC
  Administered 2015-07-19: 01:00:00 via INTRAVENOUS

## 2015-07-18 MED ORDER — IPRATROPIUM-ALBUTEROL 0.5-2.5 (3) MG/3ML IN SOLN
3.0000 mL | Freq: Four times a day (QID) | RESPIRATORY_TRACT | Status: DC
  Administered 2015-07-19 – 2015-07-20 (×5): 3 mL via RESPIRATORY_TRACT
  Filled 2015-07-18 (×7): qty 3

## 2015-07-18 MED ORDER — AZITHROMYCIN 250 MG PO TABS: 500.0000 mg | ORAL_TABLET | Freq: Once | ORAL | Status: DC

## 2015-07-18 MED ORDER — SODIUM CHLORIDE 0.9 % IV SOLN
INTRAVENOUS | Status: DC
Start: 2015-07-18 — End: 2015-07-18
  Administered 2015-07-18: 07:00:00 via INTRAVENOUS

## 2015-07-18 MED ORDER — ALPRAZOLAM 0.25 MG PO TABS
0.2500 mg | ORAL_TABLET | Freq: Two times a day (BID) | ORAL | Status: DC | PRN
  Administered 2015-07-18 – 2015-07-19 (×3): 0.25 mg via ORAL
  Filled 2015-07-18 (×3): qty 1

## 2015-07-18 MED ORDER — CEFTRIAXONE SODIUM 2 G IJ SOLR
2.0000 g | INTRAMUSCULAR | Status: DC
  Administered 2015-07-19: 2 g via INTRAVENOUS
  Filled 2015-07-18: qty 2

## 2015-07-18 MED ORDER — INSULIN ASPART 100 UNIT/ML ~~LOC~~ SOLN
0.0000 [IU] | Freq: Three times a day (TID) | SUBCUTANEOUS | Status: DC
  Administered 2015-07-18: 1 [IU] via SUBCUTANEOUS
  Administered 2015-07-18: 2 [IU] via SUBCUTANEOUS
  Administered 2015-07-19 – 2015-07-20 (×2): 1 [IU] via SUBCUTANEOUS

## 2015-07-18 MED ORDER — ENOXAPARIN SODIUM 40 MG/0.4ML ~~LOC~~ SOLN
40.0000 mg | SUBCUTANEOUS | Status: DC
  Administered 2015-07-18 – 2015-07-20 (×3): 40 mg via SUBCUTANEOUS
  Filled 2015-07-18 (×3): qty 0.4

## 2015-07-18 MED ORDER — ONDANSETRON HCL 4 MG PO TABS: 4.0000 mg | ORAL_TABLET | Freq: Four times a day (QID) | ORAL | Status: DC | PRN

## 2015-07-18 NOTE — Care Management Obs Status (Addendum)
[place] NOTIFICATION   Patient Details  Name: Katie Alvarado MRN: [id number] Date of Birth: [birth year]   Medicare Observation Status Notification Given:   (pt is in isolation room.)  A copy was given to pt.     Purcell Mouton, RN 07/18/2015, 4:02 PM

## 2015-07-18 NOTE — ED Provider Notes (Signed)
CSN: [id number]     Arrival date & time 07/17/15  2315 History  By signing my name below, I, Rohini Rajnarayanan, attest that this documentation has been prepared under the direction and in the presence of Everlene Balls, MD Electronically Signed: Evonnie Dawes, ED Scribe 07/18/2015 at 2:40 AM.   Chief Complaint  Patient presents with  . Weakness  . Urinary Retention   The history is provided by the patient. No language interpreter was used.    HPI Comments: Katie Alvarado is a 82 y.o. female brought in by ambulance, with a pmhx of HTN, DM, COPD, SOB and arthritis, who presents to the Emergency Department complaining of generalized weakness x2 weeks and difficulty urinating x5 days. Pt currently has a kidney infection, diagnosed by her PCP, for which she is taking minocycline. She also has an occasionally productive cough that is now worsening. Pt denies any pain or fever. Patient states she can no longer do her normal activities due to her severe fatigue and weakness each day.    Past Medical History  Diagnosis Date  . Other symptoms involving nervous and musculoskeletal systems(781.99)   . Insomnia, unspecified   . Cough   . Bronchiectasis with acute exacerbation ([place])   . Acute nasopharyngitis (common cold)   . Esophageal reflux   . Hypertension   . Diabetes mellitus     fasting 130-150  . COPD (chronic obstructive pulmonary disease) ([place])     sees dr. Annamaria Boots   . Shortness of breath     exertion  . Pneumonia     hx of  . Dizziness   . Cancer ([place])     skin cancer  . Arthritis    Past Surgical History  Procedure Laterality Date  . Abdominal hysterectomy    . Appendectomy    . Colon surgery      colon resection for diverticulitis  . Ankle fracture surgery Left   . Lumbar laminectomy/decompression microdiscectomy N/A 02/15/2013    Procedure: LUMBAR LAMINECTOMY/DECOMPRESSION MICRODISCECTOMY LUMBAR FOUR-FIVE;  Surgeon: Otilio Connors, MD;  Location: [place] [facility];  Service:  Neurosurgery;  Laterality: N/A;  . Cataract extraction, bilateral      OUT PATIENT  . Basal cell carcinoma excision      NOSE  . Varicose vein surgery     Family History  Problem Relation Age of Onset  . Chronic bronchitis Father   . Other Sister     heart trouble  . Other Brother     heart trouble   Social History  Substance Use Topics  . Smoking status: Never Smoker   . Smokeless tobacco: Never Used  . Alcohol Use: Yes     Comment: "Hardly Ever"    OB History    No data available     Review of Systems 10 Systems reviewed and all are negative for acute change except as noted in the HPI.  Allergies  Welchol; Glucosamine; Tramadol; Celecoxib; and Statins  Home Medications   Prior to Admission medications   Medication Sig Start Date End Date Taking? Authorizing Provider  acetaminophen (TYLENOL) 325 MG tablet Take 2 tablets (650 mg total) by mouth every 6 (six) hours as needed for pain. Patient taking differently: Take 325 mg by mouth every 6 (six) hours as needed for pain.  08/05/12  Yes Eugenie Filler, MD  allopurinol (ZYLOPRIM) 100 MG tablet Take 1 tablet by mouth daily. 06/07/13  Yes Historical Provider, MD  ALPRAZolam Duanne Moron) 0.25 MG tablet Take 0.25  mg by mouth 2 (two) times daily as needed for sleep or anxiety. For anxiety.   Yes Historical Provider, MD  amLODipine (NORVASC) 2.5 MG tablet Take 1 tablet (2.5 mg total) by mouth daily. 04/25/15  Yes Jerline Pain, MD  Artificial Tear Ointment (DRY EYES OP) Apply 1 drop to eye as needed (for dry eyes).   Yes Historical Provider, MD  aspirin EC 81 MG tablet Take 81 mg by mouth every morning.   Yes Historical Provider, MD  benzonatate (TESSALON) 200 MG capsule Take 1 capsule (200 mg total) by mouth 3 (three) times daily as needed for cough. 03/20/15  Yes Deneise Lever, MD  budesonide (PULMICORT) 0.25 MG/2ML nebulizer solution Take 2 mLs (0.25 mg total) by nebulization daily. 10/31/14  Yes Deneise Lever, MD   chlorpheniramine-HYDROcodone (TUSSIONEX PENNKINETIC ER) 10-8 MG/5ML SUER Take 5 mLs by mouth every 12 (twelve) hours as needed for cough. 03/20/15  Yes Deneise Lever, MD  cholecalciferol (VITAMIN D) 1000 UNITS tablet Take 2,000 Units by mouth every morning.    Yes Historical Provider, MD  Cinnamon 500 MG capsule Take 500 mg by mouth every morning.    Yes Historical Provider, MD  doxycycline (VIBRAMYCIN) 100 MG capsule Take 1 capsule by mouth 2 (two) times daily. 07/12/15  Yes Historical Provider, MD  esomeprazole (NEXIUM) 40 MG capsule Take 40 mg by mouth every morning.    Yes Historical Provider, MD  ipratropium (ATROVENT) 0.02 % nebulizer solution Take 2.5 mLs (500 mcg total) by nebulization 4 (four) times daily. 11/01/14  Yes Deneise Lever, MD  losartan (COZAAR) 100 MG tablet Take 50 mg by mouth daily. 03/01/15  Yes Historical Provider, MD  meclizine (ANTIVERT) 25 MG tablet Take 25 mg by mouth every 6 (six) hours as needed for dizziness.  03/14/15  Yes Historical Provider, MD  metFORMIN (GLUCOPHAGE) 500 MG tablet Take 750 mg by mouth 2 (two) times daily.    Yes Historical Provider, MD  Omega-3 Fatty Acids (FISH OIL) 1000 MG CAPS Take 1 capsule by mouth daily.   Yes Historical Provider, MD  PROAIR HFA 108 (90 Base) MCG/ACT inhaler INHALE 2 PUFFS INTO THE LUNGS EVERY 6 HOURS AS NEEDED FOR WHEEZING OR SHORTNESS OF BREATH 07/07/15  Yes Noralee Space, MD  traMADol (ULTRAM) 50 MG tablet Take 50 mg by mouth every 6 (six) hours as needed for moderate pain.   Yes Historical Provider, MD  triamterene-hydrochlorothiazide (MAXZIDE) 75-50 MG per tablet Take 0.5 tablets by mouth every other day.  09/18/11  Yes Historical Provider, MD  vitamin E 400 UNIT capsule Take 400 Units by mouth daily.   Yes Historical Provider, MD  cyclobenzaprine (FLEXERIL) 10 MG tablet Take 1 tablet (10 mg total) by mouth 3 (three) times daily as needed for muscle spasms. Patient not taking: Reported on 07/18/2015 02/16/13   Hazle Coca, MD  HYDROcodone-homatropine [facility]) 5-1.5 MG/5ML syrup Take 5 mLs by mouth every 6 (six) hours as needed for cough. Patient not taking: Reported on 07/18/2015 11/08/13   Deneise Lever, MD  oxyCODONE-acetaminophen (PERCOCET/ROXICET) 5-325 MG per tablet Take 1-2 tablets by mouth every 4 (four) hours as needed for pain. Patient not taking: Reported on 07/18/2015 02/16/13   Hazle Coca, MD   BP 152/99 mmHg  Pulse 75  Temp(Src) 97.5 F (36.4 C) (Oral)  Resp 20  Ht 5\' 8"  (1.727 m)  Wt 150 lb (68.04 kg)  BMI 22.81 kg/m2  SpO2 99% Physical Exam  Constitutional: She  is oriented to person, place, and time. She appears well-developed and well-nourished. No distress.  HENT:  Head: Normocephalic and atraumatic.  Nose: Nose normal.  Mouth/Throat: Oropharynx is clear and moist. No oropharyngeal exudate.  Eyes: Conjunctivae and EOM are normal. Pupils are equal, round, and reactive to light. No scleral icterus.  Neck: Normal range of motion. Neck supple. No JVD present. No tracheal deviation present. No thyromegaly present.  Cardiovascular: Normal rate, regular rhythm and normal heart sounds.  Exam reveals no gallop and no friction rub.   No murmur heard. Pulmonary/Chest: Effort normal. No respiratory distress. She has no wheezes. She has rales. She exhibits no tenderness.  Frequent coughing upon examination. Crackles on b/l bases.   Abdominal: Soft. Bowel sounds are normal. She exhibits no distension and no mass. There is no tenderness. There is no rebound and no guarding.  Musculoskeletal: Normal range of motion. She exhibits no edema or tenderness.  Lymphadenopathy:    She has no cervical adenopathy.  Neurological: She is alert and oriented to person, place, and time. No cranial nerve deficit. She exhibits normal muscle tone.  Skin: Skin is warm and dry. Rash noted. There is erythema. No pallor.  Rash to b/l thighs. 2cm circumferential, erythematous and blanching. Mild TTP on rashes.    Nursing note and vitals reviewed.   ED Course  Procedures  DIAGNOSTIC STUDIES: Oxygen Saturation is 99% on RA, normal by my interpretation.    COORDINATION OF CARE: 2:20 AM-Discussed treatment plan which includes DG Chest, blood work, UA, and EKG with pt at bedside and pt agreed to plan.  Labs Review Labs Reviewed  CBC WITH DIFFERENTIAL/PLATELET - Abnormal; Notable for the following:    Hemoglobin 11.4 (*)    HCT 33.6 (*)    Monocytes Absolute 1.1 (*)    All other components within normal limits  URINALYSIS, ROUTINE W REFLEX MICROSCOPIC (NOT AT [facility])  COMPREHENSIVE METABOLIC PANEL  LIPASE, BLOOD  I-STAT CG4 LACTIC ACID, ED  Randolm Idol, ED    Imaging Review Dg Chest 2 View  07/18/2015  CLINICAL DATA:  82 year old female with COPD and cough. EXAM: CHEST  2 VIEW COMPARISON:  Chest radiograph dated 11/13/2014 and CT dated 10/29/2012 FINDINGS: Two views of the chest demonstrate an area of chronic parenchymal changes in the lingula consistent with bronchiectatic changes seen on the CT. Bilateral pulmonary nodular densities again noted which are more prominent on the right and grossly similar in appearance compared prior study. There is no focal consolidation, pleural effusion, or pneumothorax. The cardiac silhouette is within normal limits. No acute osseous pathology. IMPRESSION: Focal area of grossly stable parenchymal changes and bilateral pulmonary nodular densities corresponding to changes seen on the prior radiograph and CT. No significant interval change. Clinical correlation follow-up recommended. Electronically Signed   By: Anner Crete M.D.   On: 07/18/2015 00:26   I have personally reviewed and evaluated these images and lab results as part of my medical decision-making.   EKG Interpretation   Date/Time:  Monday July 17 2015 23:46:27 EDT Ventricular Rate:  76 PR Interval:  165 QRS Duration: 101 QT Interval:  402 QTC Calculation: 452 R Axis:   2 Text  Interpretation:  Sinus rhythm Atrial premature complex Abnormal  R-wave progression, early transition No significant change since last  tracing Confirmed by Glynn Octave [phone]) on 07/18/2015 2:36:47 AM      MDM   Final diagnoses:  None   Patient presents to the ED for generalized weakness.  She  was taking minocycline for her UTI  but does not think it is working.  UA is negative for infection.  CXR is abnormal, will obtain CT for evaluation. In addition, she has crackles on exam.  Troponin and EKG are unremarkable. She will require admission for generalized weakness.  CT reveals chronic or possibly fungal infection in her lungs.  Will admit to hospitalist for further care and to discuss abx treatment.  I personally performed the services described in this documentation, which was scribed in my presence. The recorded information has been reviewed and is accurate.      Everlene Balls, MD 07/18/15 [phone]

## 2015-07-18 NOTE — Progress Notes (Signed)
PROGRESS NOTE    Katie Alvarado  [id number]  DOB: 1925-06-12  DOA: 07/17/2015 PCP: Gennette Pac, MD Outpatient Specialists: Pulmonology: Dr. Cristy Hilts course: 82 year old female with history of bronchiectasis, GERD, HTN, DM 2, COPD, MAI-intolerant to and did not complete treatment in 10/2012 (clarithromycin/rifabutin/ethambutol) and required hospitalization for weakness, this was then changed to levofloxacin/azithromycin/rifabutin which she tolerated only for 3-4 weeks, recently treated for UTI, has history of chronic cough, presented to ED with complaints of generalized weakness, increased cough with increased production of clear sputum but no fever, chills or night sweats. States that she had lost 10 pounds recently but has gained back 4 pounds. CT chest significant for tree-in-bud appearance and worse from prior. Admitted for community-acquired pneumonia and possible MAI. Infectious disease consulted.  Assessment & Plan:   MAI of lungs/bronchiectasis - Patient remains on airborne isolation. - CT findings as below. - Infectious disease consultation appreciated and have discussed at length with patient. She is currently tolerating azithromycin and Dr. Johnnye Sima indicates that Levaquin could be added to see if she can tolerate this (not added yet). Patient and spouse wish to discuss treatment. She may have been intolerant to rifampin/rifabutin.  Community-acquired pneumonia - As per ID, in the interim continue treatment for community-acquired pneumonia.  Recent UTI - Has been on treatment for this as outpatient. Current UA not consistent with infection and would not treat.   Suspected drug rash - ? Secondary to doxycycline. DC. When necessary hydrocortisone cream for itching. Seems to be better. Monitor.  Generalized weakness/? Adult failure to thrive - Feels stronger. As per PT, home health PT recommended.  DM 2 - SSI. Hold oral medications.  Anemia -  Follow CBCs.  Essential hypertension - Controlled    DVT prophylaxis: Lovenox Code Status: Full Family Communication: Discussed with patient's friend at bedside on 3/28 Disposition Plan: DC home when medically stable   Consultants:  Infectious disease  Procedures:  None  Antimicrobials:  Azithromycin 3/28 >  Rocephin 3/28 >   Subjective: Feels stronger. Chronic cough for several years, recently more productive in the last couple of months with white sputum. Denies dyspnea, chest pain, fever or chills. Some weight loss. No dysuria this morning.  Objective: Filed Vitals:   07/18/15 0552 07/18/15 0618 07/18/15 0932 07/18/15 0934  BP:  121/53    Pulse:  70    Temp: 97.5 F (36.4 C) 97.6 F (36.4 C)    TempSrc: Oral Oral    Resp:  18    Height:      Weight:      SpO2:  95% 96% 96%    Intake/Output Summary (Last 24 hours) at 07/18/15 1554 Last data filed at 07/18/15 [id number]  Gross per 24 hour  Intake    300 ml  Output      0 ml  Net    300 ml   Filed Weights   07/17/15 2323  Weight: 68.04 kg (150 lb)    Exam:  General exam: Pleasant elderly female sitting up comfortably in bed this morning. Respiratory system: Slightly harsh breath sounds in the bases but otherwise clear to auscultation. No increased work of breathing. Cardiovascular system: S1 & S2 heard, RRR. No JVD, murmurs, gallops, clicks or pedal edema. Telemetry: Sinus rhythm. Gastrointestinal system: Abdomen is nondistended, soft and nontender. Normal bowel sounds heard. Central nervous system: Alert and oriented. No focal neurological deficits. Extremities: Symmetric 5 x 5 power.   Data Reviewed: Basic Metabolic Panel:  Recent  Labs Lab 07/18/15 0040  NA 133*  K 3.8  CL 98*  CO2 23  GLUCOSE 131*  BUN 22*  CREATININE 0.64  CALCIUM 9.4   Liver Function Tests:  Recent Labs Lab 07/18/15 0040  AST 18  ALT 13*  ALKPHOS 50  BILITOT 0.4  PROT 6.5  ALBUMIN 3.6    Recent Labs Lab  07/18/15 0040  LIPASE 39   No results for input(s): AMMONIA in the last 168 hours. CBC:  Recent Labs Lab 07/17/15 2341  WBC 6.9  NEUTROABS 3.3  HGB 11.4*  HCT 33.6*  MCV 86.6  PLT 274   Cardiac Enzymes: No results for input(s): CKTOTAL, CKMB, CKMBINDEX, TROPONINI in the last 168 hours. BNP (last 3 results) No results for input(s): PROBNP in the last 8760 hours. CBG:  Recent Labs Lab 07/18/15 0801 07/18/15 1158  GLUCAP 127* 115*    No results found for this or any previous visit (from the past 240 hour(s)).       Studies: Dg Chest 2 View  07/18/2015  CLINICAL DATA:  82 year old female with COPD and cough. EXAM: CHEST  2 VIEW COMPARISON:  Chest radiograph dated 11/13/2014 and CT dated 10/29/2012 FINDINGS: Two views of the chest demonstrate an area of chronic parenchymal changes in the lingula consistent with bronchiectatic changes seen on the CT. Bilateral pulmonary nodular densities again noted which are more prominent on the right and grossly similar in appearance compared prior study. There is no focal consolidation, pleural effusion, or pneumothorax. The cardiac silhouette is within normal limits. No acute osseous pathology. IMPRESSION: Focal area of grossly stable parenchymal changes and bilateral pulmonary nodular densities corresponding to changes seen on the prior radiograph and CT. No significant interval change. Clinical correlation follow-up recommended. Electronically Signed   By: Anner Crete M.D.   On: 07/18/2015 00:26   Ct Chest Wo Contrast  07/18/2015  CLINICAL DATA:  Subacute onset of generalized weakness and productive cough. Initial encounter. EXAM: CT CHEST WITHOUT CONTRAST TECHNIQUE: Multidetector CT imaging of the chest was performed following the standard protocol without IV contrast. COMPARISON:  Chest radiograph performed 07/17/2015, and CT of the chest performed 06/01/2012 FINDINGS: Patchy nodular opacities are noted within both lungs, with some  degree of central lucency, and underlying prominent blebs along the fissures. Additional scattered tree-in-bud opacities are seen. This demonstrates some degree of redistribution since 2014, and is concerning for chronic atypical infection, such as a fungal infection or Mycobacterium avium intracellulare. No pleural effusion or pneumothorax is identified. The appearance is less typical for malignancy. Diffuse coronary artery calcifications are seen. Diffuse calcification is noted along the descending thoracic aorta. No mediastinal lymphadenopathy is appreciated. Scattered calcification noted along the proximal great vessels. No pericardial effusion is identified. The visualized portions of the thyroid gland are unremarkable. No axillary lymphadenopathy is seen. The visualized portions of the liver and spleen are unremarkable. The visualized portions of the adrenal glands are within normal limits. No acute osseous abnormalities are seen. IMPRESSION: 1. Patchy nodular opacities within both lungs, with some degree of central lucency, and underlying prominent blebs along the fissures. Associated scattered tree-in-bud opacity seen. This demonstrates some degree of redistribution since 2014, and is concerning for chronic or recurrent atypical infection, such as a fungal infection or Mycobacterium avium intracellulare. 2. Diffuse coronary artery calcifications seen. Diffuse calcification along the descending thoracic aorta. Electronically Signed   By: Garald Balding M.D.   On: 07/18/2015 04:13        Scheduled Meds: .  allopurinol  100 mg Oral Daily  . amLODipine  2.5 mg Oral Daily  . aspirin EC  81 mg Oral q morning - 10a  . azithromycin  500 mg Intravenous Q24H  . budesonide  0.25 mg Nebulization Daily  . [START ON 07/19/2015] cefTRIAXone (ROCEPHIN)  IV  2 g Intravenous Q24H  . enoxaparin (LOVENOX) injection  40 mg Subcutaneous Q24H  . insulin aspart  0-9 Units Subcutaneous TID WC  . ipratropium-albuterol   3 mL Nebulization Q4H  . losartan  50 mg Oral Daily  . pantoprazole  80 mg Oral Q1200  . sodium chloride flush  3 mL Intravenous Q12H  . vitamin E  400 Units Oral Daily   Continuous Infusions: . sodium chloride 75 mL/hr at 07/18/15 [id number]    Principal Problem:   Bronchiectasis with acute exacerbation [facility]) Active Problems:   Hyponatremia   Generalized weakness   Essential hypertension   Anemia    Time spent: 25 minutes.    Vernell Leep, MD, FACP, FHM. [facility] Pager [phone]  If 7PM-7AM, please contact night-coverage [email or web address] Password [facility] 07/18/2015, 3:54 PM

## 2015-07-18 NOTE — Consult Note (Signed)
Katie Alvarado for Infectious Disease  Date of Admission:  07/17/2015  Date of Consult:  07/18/2015  Reason for Consult: MAI of lungs Referring Physician: [facility]  Impression/Recommendation MAI of lungs We spoke at length about re-treating her for MAI.  I cannot guarantee her a cure, only that she may feel better while on medications. I also cannot guarantee that she will not have side effects from the medications (as she has in the past, twice).  She seems to be tolerating Azithro currently, could add levaquin to see if she can tolerate this. It may be the rifampin/rifabutin that is causing the problems.  She and her husband will discuss treatment.   For now would treat her as CAP.   Recent UTI Her current UA is not consistent with infection Would not retreat.   Thank you so much for this interesting consult,   Katie Alvarado (pager) [phone] [email or web address]  Katie Alvarado is an 82 y.o. female.  HPI: 82 yo F with hx of DM2, MAI last seen by ID [id number]. She had a +sputum Cx (05-2012) and was started on clarithro/rifabutin/ETH. She was intolerant of this, requiring hospitalization for weakness. She was then changed to levaquin/azithro/rifabutin and was able to tolerate this for only 3-4 weeks. She and her husband are non-specific about why she stopped, only that it made her feel poorly.  She returns now with 2 recent UTIs and now increasing weakness and cough. She has had increasing sputum volume, clear in color. She had CT on adm notable for tree in bud appearance, with redistribution from 2014 concerning for chronic or recurrent atypical infection.  On adm today she is on day 5/7 of doxy for her UTI. She notes a rash on her thighs.  She states her cough has been worse over 3-4 months. She has had "hot flashes" but no fevers. She feels like she is using her nebulizer more, even having to get up at night to use it.  She denies sick contacts.   Past Medical History    Diagnosis Date  . Other symptoms involving nervous and musculoskeletal systems(781.99)   . Insomnia, unspecified   . Cough   . Bronchiectasis with acute exacerbation ([place])   . Acute nasopharyngitis (common cold)   . Esophageal reflux   . Hypertension   . Diabetes mellitus     fasting 130-150  . COPD (chronic obstructive pulmonary disease) ([place])     sees dr. Annamaria Boots   . Shortness of breath     exertion  . Pneumonia     hx of  . Dizziness   . Cancer ([place])     skin cancer  . Arthritis     Past Surgical History  Procedure Laterality Date  . Abdominal hysterectomy    . Appendectomy    . Colon surgery      colon resection for diverticulitis  . Ankle fracture surgery Left   . Lumbar laminectomy/decompression microdiscectomy N/A 02/15/2013    Procedure: LUMBAR LAMINECTOMY/DECOMPRESSION MICRODISCECTOMY LUMBAR FOUR-FIVE;  Surgeon: Otilio Connors, MD;  Location: [facility];  Service: Neurosurgery;  Laterality: N/A;  . Cataract extraction, bilateral      OUT PATIENT  . Basal cell carcinoma excision      NOSE  . Varicose vein surgery       Allergies  Allergen Reactions  . Welchol [Colesevelam Hcl] Hives  . Glucosamine Other (See Comments)    Unknown  . Tramadol Nausea Only  . Celecoxib Hives, Itching and Rash  .  Statins Rash    Medications:  Scheduled: . allopurinol  100 mg Oral Daily  . amLODipine  2.5 mg Oral Daily  . aspirin EC  81 mg Oral q morning - 10a  . azithromycin  500 mg Intravenous Q24H  . budesonide  0.25 mg Nebulization Daily  . [START ON 07/19/2015] cefTRIAXone (ROCEPHIN)  IV  2 g Intravenous Q24H  . enoxaparin (LOVENOX) injection  40 mg Subcutaneous Q24H  . insulin aspart  0-9 Units Subcutaneous TID WC  . ipratropium-albuterol  3 mL Nebulization Q4H  . losartan  50 mg Oral Daily  . pantoprazole  80 mg Oral Q1200  . sodium chloride flush  3 mL Intravenous Q12H  . vitamin E  400 Units Oral Daily    Abtx:  Anti-infectives    Start     Dose/Rate  Route Frequency Ordered Stop   07/19/15 0600  cefTRIAXone (ROCEPHIN) 2 g in dextrose 5 % 50 mL IVPB     2 g 100 mL/hr over 30 Minutes Intravenous Every 24 hours 07/18/15 0624     07/18/15 0800  azithromycin (ZITHROMAX) 500 mg in dextrose 5 % 250 mL IVPB     500 mg 250 mL/hr over 60 Minutes Intravenous Every 24 hours 07/18/15 0553     07/18/15 0530  cefTRIAXone (ROCEPHIN) 2 g in dextrose 5 % 50 mL IVPB     2 g 100 mL/hr over 30 Minutes Intravenous  Once 07/18/15 0521 07/18/15 0748   07/18/15 0530  azithromycin (ZITHROMAX) tablet 500 mg  Status:  Discontinued     500 mg Oral  Once 07/18/15 0521 07/18/15 0623      Total days of antibiotics: 0 ceftriaxone/azithro          Social History:  reports that she has never smoked. She has never used smokeless tobacco. She reports that she drinks alcohol. She reports that she does not use illicit drugs.  Family History  Problem Relation Age of Onset  . Chronic bronchitis Father   . Other Sister     heart trouble  . Other Brother     heart trouble    General ROS: normal BM, normal apetite, 12 point ROS o/w negative. see hpi  Blood pressure 121/53, pulse 70, temperature 97.6 F (36.4 C), temperature source Oral, resp. rate 18, height 5' 8"  (1.727 m), weight 68.04 kg (150 lb), SpO2 96 %. General appearance: alert, cooperative and no distress Eyes: negative findings: conjunctivae and sclerae normal and pupils equal, round, reactive to light and accomodation Throat: normal findings: oropharynx pink & moist without lesions or evidence of thrush Neck: no adenopathy and supple, symmetrical, trachea midline Lungs: rhonchi bilaterally Heart: regular rate and rhythm Abdomen: normal findings: bowel sounds normal and soft, non-tender Extremities: edema none Skin: macular - upper leg(s) bilateral   Results for orders placed or performed during the hospital encounter of 07/17/15 (from the past 48 hour(s))  Urinalysis, Routine w reflex microscopic  (not at [facility])     Status: None   Collection Time: 07/17/15 11:31 PM  Result Value Ref Range   Color, Urine YELLOW YELLOW   APPearance CLEAR CLEAR   Specific Gravity, Urine 1.013 1.005 - 1.030   pH 6.5 5.0 - 8.0   Glucose, UA NEGATIVE NEGATIVE mg/dL   Hgb urine dipstick NEGATIVE NEGATIVE   Bilirubin Urine NEGATIVE NEGATIVE   Ketones, ur NEGATIVE NEGATIVE mg/dL   Protein, ur NEGATIVE NEGATIVE mg/dL   Nitrite NEGATIVE NEGATIVE   Leukocytes, UA NEGATIVE NEGATIVE  Comment: MICROSCOPIC NOT DONE ON URINES WITH NEGATIVE PROTEIN, BLOOD, LEUKOCYTES, NITRITE, OR GLUCOSE <1000 mg/dL.  CBC with Differential/Platelet     Status: Abnormal   Collection Time: 07/17/15 11:41 PM  Result Value Ref Range   WBC 6.9 4.0 - 10.5 K/uL   RBC 3.88 3.87 - 5.11 MIL/uL   Hemoglobin 11.4 (L) 12.0 - 15.0 g/dL   HCT 33.6 (L) 36.0 - 46.0 %   MCV 86.6 78.0 - 100.0 fL   MCH 29.4 26.0 - 34.0 pg   MCHC 33.9 30.0 - 36.0 g/dL   RDW 13.1 11.5 - 15.5 %   Platelets 274 150 - 400 K/uL   Neutrophils Relative % 48 %   Neutro Abs 3.3 1.7 - 7.7 K/uL   Lymphocytes Relative 31 %   Lymphs Abs 2.1 0.7 - 4.0 K/uL   Monocytes Relative 16 %   Monocytes Absolute 1.1 (H) 0.1 - 1.0 K/uL   Eosinophils Relative 4 %   Eosinophils Absolute 0.3 0.0 - 0.7 K/uL   Basophils Relative 1 %   Basophils Absolute 0.1 0.0 - 0.1 K/uL  I-Stat CG4 Lactic Acid, ED     Status: None   Collection Time: 07/17/15 11:47 PM  Result Value Ref Range   Lactic Acid, Venous 1.25 0.5 - 2.0 mmol/L  Comprehensive metabolic panel     Status: Abnormal   Collection Time: 07/18/15 12:40 AM  Result Value Ref Range   Sodium 133 (L) 135 - 145 mmol/L   Potassium 3.8 3.5 - 5.1 mmol/L   Chloride 98 (L) 101 - 111 mmol/L   CO2 23 22 - 32 mmol/L   Glucose, Bld 131 (H) 65 - 99 mg/dL   BUN 22 (H) 6 - 20 mg/dL   Creatinine, Ser 0.64 0.44 - 1.00 mg/dL   Calcium 9.4 8.9 - 10.3 mg/dL   Total Protein 6.5 6.5 - 8.1 g/dL   Albumin 3.6 3.5 - 5.0 g/dL   AST 18 15 - 41 U/L    ALT 13 (L) 14 - 54 U/L   Alkaline Phosphatase 50 38 - 126 U/L   Total Bilirubin 0.4 0.3 - 1.2 mg/dL   GFR calc non Af Amer >60 >60 mL/min   GFR calc Af Amer >60 >60 mL/min    Comment: (NOTE) The eGFR has been calculated using the CKD EPI equation. This calculation has not been validated in all clinical situations. eGFR's persistently <60 mL/min signify possible Chronic Kidney Disease.    Anion gap 12 5 - 15  Lipase, blood     Status: None   Collection Time: 07/18/15 12:40 AM  Result Value Ref Range   Lipase 39 11 - 51 U/L  I-stat troponin, ED     Status: None   Collection Time: 07/18/15  2:55 AM  Result Value Ref Range   Troponin i, poc 0.01 0.00 - 0.08 ng/mL   Comment 3            Comment: Due to the release kinetics of cTnI, a negative result within the first hours of the onset of symptoms does not rule out myocardial infarction with certainty. If myocardial infarction is still suspected, repeat the test at appropriate intervals.   Glucose, capillary     Status: Abnormal   Collection Time: 07/18/15  8:01 AM  Result Value Ref Range   Glucose-Capillary 127 (H) 65 - 99 mg/dL      Component Value Date/Time   SDES URINE, CLEAN CATCH 07/29/2012 1448   SPECREQUEST NONE 07/29/2012  [place] 07/29/2012 1448   REPTSTATUS 07/31/2012 FINAL 07/29/2012 1448   Dg Chest 2 View  07/18/2015  CLINICAL DATA:  82 year old female with COPD and cough. EXAM: CHEST  2 VIEW COMPARISON:  Chest radiograph dated 11/13/2014 and CT dated 10/29/2012 FINDINGS: Two views of the chest demonstrate an area of chronic parenchymal changes in the lingula consistent with bronchiectatic changes seen on the CT. Bilateral pulmonary nodular densities again noted which are more prominent on the right and grossly similar in appearance compared prior study. There is no focal consolidation, pleural effusion, or pneumothorax. The cardiac silhouette is within normal limits. No acute osseous  pathology. IMPRESSION: Focal area of grossly stable parenchymal changes and bilateral pulmonary nodular densities corresponding to changes seen on the prior radiograph and CT. No significant interval change. Clinical correlation follow-up recommended. Electronically Signed   By: Anner Crete M.D.   On: 07/18/2015 00:26   Ct Chest Wo Contrast  07/18/2015  CLINICAL DATA:  Subacute onset of generalized weakness and productive cough. Initial encounter. EXAM: CT CHEST WITHOUT CONTRAST TECHNIQUE: Multidetector CT imaging of the chest was performed following the standard protocol without IV contrast. COMPARISON:  Chest radiograph performed 07/17/2015, and CT of the chest performed 06/01/2012 FINDINGS: Patchy nodular opacities are noted within both lungs, with some degree of central lucency, and underlying prominent blebs along the fissures. Additional scattered tree-in-bud opacities are seen. This demonstrates some degree of redistribution since 2014, and is concerning for chronic atypical infection, such as a fungal infection or Mycobacterium avium intracellulare. No pleural effusion or pneumothorax is identified. The appearance is less typical for malignancy. Diffuse coronary artery calcifications are seen. Diffuse calcification is noted along the descending thoracic aorta. No mediastinal lymphadenopathy is appreciated. Scattered calcification noted along the proximal great vessels. No pericardial effusion is identified. The visualized portions of the thyroid gland are unremarkable. No axillary lymphadenopathy is seen. The visualized portions of the liver and spleen are unremarkable. The visualized portions of the adrenal glands are within normal limits. No acute osseous abnormalities are seen. IMPRESSION: 1. Patchy nodular opacities within both lungs, with some degree of central lucency, and underlying prominent blebs along the fissures. Associated scattered tree-in-bud opacity seen. This demonstrates some degree  of redistribution since 2014, and is concerning for chronic or recurrent atypical infection, such as a fungal infection or Mycobacterium avium intracellulare. 2. Diffuse coronary artery calcifications seen. Diffuse calcification along the descending thoracic aorta. Electronically Signed   By: Garald Balding M.D.   On: 07/18/2015 04:13   No results found for this or any previous visit (from the past 240 hour(s)).    07/18/2015, 9:45 AM        Records and images were personally reviewed where available.

## 2015-07-18 NOTE — Consult Note (Signed)
   [facility] CM Inpatient Consult   07/18/2015  Katie Alvarado 02/04/1926 [id number]     [facility] Care Management referral received from inpatient [facility] for [place] Management program services. Spoke with patient at bedside to explain and offer [place] [facility] program. She states she is familiar with [facility] program and is interested. Written consent obtained. Denies having issues with affording medications or with transportation. She endorses she lives alone but has a very supportive significant other, Assunta Found. Primary Care MD is Dr. Rex Kras with [facility]. Confirmed best contact number as [phone]. She has a history of DM, COPD, HTN. Will request for patient to be assign to [facility] for transition of care and symptom and disease management for COPD, DM. Made inpatient RNCM aware that [place] [facility] will follow up post discharge.    Marthenia Rolling, MSN-Ed, RN,BSN [facility] Liaison [phone]

## 2015-07-18 NOTE — Evaluation (Addendum)
Physical Therapy Evaluation Patient Details Name: Katie Alvarado MRN: [id number] DOB: Jun 11, 1925 Today's Date: 07/18/2015   History of Present Illness  82 y.o. female with h/o lumbar decompression 2014, DM2, HTN, 2 recent UTIs admitted with bronchiectasis, lung CT showed possible fungal infection.  Clinical Impression  Pt admitted with above diagnosis. Pt currently with functional limitations due to the deficits listed below (see PT Problem List). Pt ambulated 41' with RW with supervision, no loss of balance. Min assist for transfers and bed mobility. She stated she can manage at home in her present condition. She has a friend that is able to assist prn. Pt was independent with a cane and RW prior to admission and should be able to return home at prior functional level.  Pt will benefit from skilled PT to increase their independence and safety with mobility to allow discharge to the venue listed below.       Follow Up Recommendations Home health PT    Equipment Recommendations  None recommended by PT    Recommendations for Other Services       Precautions / Restrictions Precautions Precautions: Other (comment) Precaution Comments: airborn; pt reports no falls in past 1 year Restrictions Weight Bearing Restrictions: No      Mobility  Bed Mobility Overal bed mobility: Needs Assistance Bed Mobility: Supine to Sit     Supine to sit: Min assist     General bed mobility comments: min A to raise trunk  Transfers Overall transfer level: Needs assistance Equipment used: Rolling walker (2 wheeled) Transfers: Sit to/from Stand Sit to Stand: Min assist         General transfer comment: min A to rise, pt rocked forward to use momentum  Ambulation/Gait Ambulation/Gait assistance: Min guard Ambulation Distance (Feet): 36 Feet Assistive device: Rolling walker (2 wheeled) Gait Pattern/deviations: Step-through pattern   Gait velocity interpretation: at or above normal speed for  age/gender General Gait Details: steady with RW, no LOB; SaO2 95% on RA walking  Stairs            Wheelchair Mobility    Modified Rankin (Stroke Patients Only)       Balance Overall balance assessment: Modified Independent                                           Pertinent Vitals/Pain Pain Assessment: No/denies pain    Home Living Family/patient expects to be discharged to:: Private residence Living Arrangements: Alone Available Help at Discharge: Friend(s);Available PRN/intermittently Type of Home: House Home Access: Ramped entrance     Home Layout: One level Home Equipment: [place] - 4 wheels;Bedside commode;Shower seat;Cane - single point Additional Comments: friend gets groceries, pt doesn't drive due to poor vision    Prior Function Level of Independence: Independent with assistive device(s)         Comments: uses rollator or cane, independent with bathing/dressing     Hand Dominance        Extremity/Trunk Assessment   Upper Extremity Assessment: Overall WFL for tasks assessed           Lower Extremity Assessment: Overall WFL for tasks assessed      Cervical / Trunk Assessment: Normal  Communication   Communication: No difficulties  Cognition Arousal/Alertness: Awake/alert Behavior During Therapy: WFL for tasks assessed/performed Overall Cognitive Status: Within Functional Limits for tasks assessed  General Comments      Exercises        Assessment/Plan    PT Assessment Patient needs continued PT services  PT Diagnosis Generalized weakness   PT Problem List Decreased activity tolerance;Decreased mobility  PT Treatment Interventions Gait training;Functional mobility training;Therapeutic activities;Patient/family education;Therapeutic exercise   PT Goals (Current goals can be found in the Care Plan section) Acute Rehab PT Goals Patient Stated Goal: return home PT Goal Formulation:  With patient Time For Goal Achievement: 08/01/15 Potential to Achieve Goals: Good    Frequency Min 3X/week   Barriers to discharge        Co-evaluation               End of Session Equipment Utilized During Treatment: Gait belt Activity Tolerance: Patient tolerated treatment well Patient left: in chair;with call bell/phone within reach;with chair alarm set Nurse Communication: Mobility status         Time: [id number] PT Time Calculation (min) (ACUTE ONLY): 24 min   Charges:   PT Evaluation $PT Eval Low Complexity: 1 Procedure PT Treatments $Gait Training: 8-22 mins   PT G Codes:  PT G-Codes **NOT FOR INPATIENT CLASS** Functional Assessment Tool Used clinical judgement clinical judgement at 1434 on 07/18/15 by Lucile Crater, PT Functional Limitation Mobility: Walking and moving around Mobility: Walking and moving around at 1434 on 07/18/15 by Lucile Crater, PT Mobility: Walking and Moving Around Current Status [phone]) At least 20 percent but less than 40 percent impaired, limited or restricted CJ at 1434 on 07/18/15 by Lucile Crater, PT Mobility: Walking and Moving Around Goal Status [phone]) At least 1 percent but less than 20 percent impaired, limited or restricted CI at 1434 on 07/18/15 by Lucile Crater, PT Mobility: Walking and Moving Around Discharge Status [phone])        Katie Alvarado 07/18/2015, 2:35 PM [phone]

## 2015-07-18 NOTE — Progress Notes (Signed)
Pharmacy Antibiotic Note  Katie Alvarado is a 82 y.o. female admitted on 07/17/2015 with pneumonia.  Pharmacy has been consulted for Ceftriaxone dosing.  Plan: Ceftriaxone 2gm iv q24hr  Height: 5\' 8"  (172.7 cm) Weight: 150 lb (68.04 kg) IBW/kg (Calculated) : 63.9  Temp (24hrs), Avg:97.5 F (36.4 C), Min:97.5 F (36.4 C), Max:97.6 F (36.4 C)   Recent Labs Lab 07/17/15 2341 07/17/15 2347 07/18/15 0040  WBC 6.9  --   --   CREATININE  --   --  0.64  LATICACIDVEN  --  1.25  --     Estimated Creatinine Clearance: 50 mL/min (by C-G formula based on Cr of 0.64).    Allergies  Allergen Reactions  . Welchol [Colesevelam Hcl] Hives  . Glucosamine Other (See Comments)    Unknown  . Tramadol Nausea Only  . Celecoxib Hives, Itching and Rash  . Statins Rash    Antimicrobials this admission: Ceftriaxone 3/28 >> Azithromycin 3/28 >>  Microbiology results: Pending  Thank you for allowing pharmacy to be a part of this patient's care.  Nani Skillern Crowford 07/18/2015 6:27 AM

## 2015-07-18 NOTE — Care Management Note (Signed)
Case Management Note  Patient Details  Name: Katie Alvarado MRN: [id number] Date of Birth: 04/12/1926  Subjective/Objective:    Pt admitted with COPD                Action/Plan: Plan to dc home   Expected Discharge Date:                  Expected Discharge Plan:  Home/Self Care  In-House Referral:     Discharge planning Services  CM Consult  Post Acute Care Choice:    Choice offered to:  Patient  DME Arranged:    DME Agency:     HH Arranged:  Patient Refused [place] Agency:     Status of Service:  Completed, signed off  Medicare Important Message Given:    Date Medicare IM Given:    Medicare IM give by:    Date Additional Medicare IM Given:    Additional Medicare Important Message give by:     If discussed at [place] of Stay Meetings, dates discussed:    Additional CommentsPurcell Mouton, RN 07/18/2015, 3:56 PM

## 2015-07-18 NOTE — H&P (Signed)
[facility] History and Physical  Katie Alvarado [id number] DOB: 09-17-1925 DOA: 07/17/2015  Referring physician: ED PCP: Katie Pac, MD   Chief Complaint:    HPI:  [name] is a 82 year old female with a past medical history significant for bronchiectasis, arthritis, diabetes mellitus type 2, history of MAI, and HTN; who presents with generalized weakness and cough.  Recently reports having 2 urinary tract infections, the first diagnosed 2 weeks ago for which she was initially started on ciprofloxacin.  She reports having to be changed over to doxycycline this past week. She has been taking the doxcycyline for the last 5 days of a reported 7 day course. She reports generally not feeling well with complaints of generalized weakness. Her husband reports that she had not been eating well and had given her Pedialyte today. Denies any fever or sick contacts to her knowledge. Complains of associated symptoms of a new rash which started in the last week with complaints of itching that subsequently turns into red patch on her skin. [name] reports having similar symptoms with taking a statin medication in years past. She also complains of a chronic cough that had progressively worsened. Sputum remains clear in color, but reports increase in amount. Complains of intermittent wheezing and some worsening shortness of breath. Not on home oxygen. Patient has been utilizing breathing treatments for her normal routine with no significant improvement in symptoms. She has a follow-up with her pulmonologist Dr. Annamaria Alvarado scheduled for later this week. Lastly, she complains of difficulty urinating, but reported increased urinary frequency tonight. with reports of burning feeling at the start of urination. She notes that she always gets a yeast infection after taking antibiotics for a long period time.  Upon admission patient was evaluated with a CT scan of the chest which showed signs of a tree-in-bud  appearance with question of recurrence or chronic atypical infection. Initial urinalysis was negative for any signs of infection.     Review of Systems  Constitutional: Positive for malaise/fatigue. Negative for fever and chills.  HENT: Negative for ear Alvarado.   Eyes: Negative for double vision and photophobia.  Respiratory: Positive for cough, sputum production, shortness of breath and wheezing.   Cardiovascular: Negative for chest Alvarado and leg swelling.  Gastrointestinal: Positive for diarrhea. Negative for nausea, vomiting, abdominal Alvarado and blood in stool.  Genitourinary: Positive for dysuria and frequency. Negative for urgency.  Musculoskeletal: Positive for joint Alvarado. Negative for falls.  Skin: Positive for itching and rash.  Neurological: Positive for weakness and headaches. Negative for sensory change, seizures and loss of consciousness.  Endo/Heme/Allergies: Negative for environmental allergies. Bruises/bleeds easily.  Psychiatric/Behavioral: Negative for hallucinations and substance abuse.     Past Medical History  Diagnosis Date  . Other symptoms involving nervous and musculoskeletal systems(781.99)   . Insomnia, unspecified   . Cough   . Bronchiectasis with acute exacerbation ([place])   . Acute nasopharyngitis (common cold)   . Esophageal reflux   . Hypertension   . Diabetes mellitus     fasting 130-150  . COPD (chronic obstructive pulmonary disease) ([place])     sees dr. Annamaria Alvarado   . Shortness of breath     exertion  . Pneumonia     hx of  . Dizziness   . Cancer ([place])     skin cancer  . Arthritis      Past Surgical History  Procedure Laterality Date  . Abdominal hysterectomy    . Appendectomy    .  Colon surgery      colon resection for diverticulitis  . Ankle fracture surgery Left   . Lumbar laminectomy/decompression microdiscectomy N/A 02/15/2013    Procedure: LUMBAR LAMINECTOMY/DECOMPRESSION MICRODISCECTOMY LUMBAR FOUR-FIVE;  Surgeon: Katie Connors, MD;   Location: [facility];  Service: Neurosurgery;  Laterality: N/A;  . Cataract extraction, bilateral      OUT PATIENT  . Basal cell carcinoma excision      NOSE  . Varicose vein surgery        Social History:  reports that she has never smoked. She has never used smokeless tobacco. She reports that she drinks alcohol. She reports that she does not use illicit drugs.   Allergies  Allergen Reactions  . Welchol [Colesevelam Hcl] Hives  . Glucosamine Other (See Comments)    Unknown  . Tramadol Nausea Only  . Celecoxib Hives, Itching and Rash  . Statins Rash    Family History  Problem Relation Age of Onset  . Chronic bronchitis Father   . Other Sister     heart trouble  . Other Brother     heart trouble        Prior to Admission medications   Medication Sig Start Date End Date Taking? Authorizing Provider  acetaminophen (TYLENOL) 325 MG tablet Take 2 tablets (650 mg total) by mouth every 6 (six) hours as needed for Alvarado. Patient taking differently: Take 325 mg by mouth every 6 (six) hours as needed for Alvarado.  08/05/12  Yes Katie Filler, MD  allopurinol (ZYLOPRIM) 100 MG tablet Take 1 tablet by mouth daily. 06/07/13  Yes Historical Provider, MD  ALPRAZolam Duanne Moron) 0.25 MG tablet Take 0.25 mg by mouth 2 (two) times daily as needed for sleep or anxiety. For anxiety.   Yes Historical Provider, MD  amLODipine (NORVASC) 2.5 MG tablet Take 1 tablet (2.5 mg total) by mouth daily. 04/25/15  Yes Katie Pain, MD  Artificial Tear Ointment (DRY EYES OP) Apply 1 drop to eye as needed (for dry eyes).   Yes Historical Provider, MD  aspirin EC 81 MG tablet Take 81 mg by mouth every morning.   Yes Historical Provider, MD  benzonatate (TESSALON) 200 MG capsule Take 1 capsule (200 mg total) by mouth 3 (three) times daily as needed for cough. 03/20/15  Yes Katie Lever, MD  budesonide (PULMICORT) 0.25 MG/2ML nebulizer solution Take 2 mLs (0.25 mg total) by nebulization daily. 10/31/14  Yes  Katie Lever, MD  chlorpheniramine-HYDROcodone (TUSSIONEX PENNKINETIC ER) 10-8 MG/5ML SUER Take 5 mLs by mouth every 12 (twelve) hours as needed for cough. 03/20/15  Yes Katie Lever, MD  cholecalciferol (VITAMIN D) 1000 UNITS tablet Take 2,000 Units by mouth every morning.    Yes Historical Provider, MD  Cinnamon 500 MG capsule Take 500 mg by mouth every morning.    Yes Historical Provider, MD  doxycycline (VIBRAMYCIN) 100 MG capsule Take 1 capsule by mouth 2 (two) times daily. 07/12/15  Yes Historical Provider, MD  esomeprazole (NEXIUM) 40 MG capsule Take 40 mg by mouth every morning.    Yes Historical Provider, MD  ipratropium (ATROVENT) 0.02 % nebulizer solution Take 2.5 mLs (500 mcg total) by nebulization 4 (four) times daily. 11/01/14  Yes Katie Lever, MD  losartan (COZAAR) 100 MG tablet Take 50 mg by mouth daily. 03/01/15  Yes Historical Provider, MD  meclizine (ANTIVERT) 25 MG tablet Take 25 mg by mouth every 6 (six) hours as needed for dizziness.  03/14/15  Yes Historical Provider, MD  metFORMIN (GLUCOPHAGE) 500 MG tablet Take 750 mg by mouth 2 (two) times daily.    Yes Historical Provider, MD  Omega-3 Fatty Acids (FISH OIL) 1000 MG CAPS Take 1 capsule by mouth daily.   Yes Historical Provider, MD  PROAIR HFA 108 (90 Base) MCG/ACT inhaler INHALE 2 PUFFS INTO THE LUNGS EVERY 6 HOURS AS NEEDED FOR WHEEZING OR SHORTNESS OF BREATH 07/07/15  Yes Noralee Space, MD  traMADol (ULTRAM) 50 MG tablet Take 50 mg by mouth every 6 (six) hours as needed for moderate Alvarado.   Yes Historical Provider, MD  triamterene-hydrochlorothiazide (MAXZIDE) 75-50 MG per tablet Take 0.5 tablets by mouth every other day.  09/18/11  Yes Historical Provider, MD  vitamin E 400 UNIT capsule Take 400 Units by mouth daily.   Yes Historical Provider, MD  cyclobenzaprine (FLEXERIL) 10 MG tablet Take 1 tablet (10 mg total) by mouth 3 (three) times daily as needed for muscle spasms. Patient not taking: Reported on 07/18/2015  02/16/13   Hazle Coca, MD  HYDROcodone-homatropine [facility]) 5-1.5 MG/5ML syrup Take 5 mLs by mouth every 6 (six) hours as needed for cough. Patient not taking: Reported on 07/18/2015 11/08/13   Katie Lever, MD  oxyCODONE-acetaminophen (PERCOCET/ROXICET) 5-325 MG per tablet Take 1-2 tablets by mouth every 4 (four) hours as needed for Alvarado. Patient not taking: Reported on 07/18/2015 02/16/13   Hazle Coca, MD     Physical Exam: Filed Vitals:   07/17/15 2329 07/18/15 0151 07/18/15 0200 07/18/15 0430  BP: 145/71 152/99 143/69 151/75  Pulse: 74 75 70 80  Temp: 97.5 F (36.4 C) 97.5 F (36.4 C)    TempSrc: Oral Oral    Resp: 18 20 21 20   Height:      Weight:      SpO2: 98% 99% 97% 95%     Constitutional: Vital signs reviewed. Patient is elderly female who appears acutely sick in appearance, but nontoxic. Head: Normocephalic and atraumatic  Ear: TM normal bilaterally  Mouth: no erythema or exudates, MMM  Eyes: PERRL, EOMI, conjunctivae normal, No scleral icterus.  Neck: Supple, Trachea midline normal ROM, No JVD, mass, thyromegaly, or carotid bruit present.  Cardiovascular: RRR, S1 normal, S2 normal, no MRG, pulses symmetric and intact bilaterally  Pulmonary/Chest: Intermittent crackles appreciated, but no wheezes, or rhonchi. Abdominal: Soft.  Mild tenderness to deep palpation suprapubically, non-distended, bowel sounds are normal, no masses, organomegaly, or guarding present.  GU: no CVA tenderness Musculoskeletal: No joint deformities, erythema, or stiffness, ROM full and no nontender Ext: no edema and no cyanosis, pulses palpable bilaterally (DP and PT)  Hematology: no cervical, inginal, or axillary adenopathy.  Neurological: A&O x3, Strenght is 4+ out of 5 symmetric bilaterally, cranial nerve II-XII are grossly intact, no focal motor deficit, sensory intact to light touch bilaterally.  Skin:   scattered red patch-like rash noted on the bilateral lower extremities Psychiatric:  Normal mood and affect. speech and behavior is normal. Judgment and thought content normal. Cognition and memory are normal.      Data Review   Micro Results No results found for this or any previous visit (from the past 240 hour(s)).  Radiology Reports Dg Chest 2 View  07/18/2015  CLINICAL DATA:  82 year old female with COPD and cough. EXAM: CHEST  2 VIEW COMPARISON:  Chest radiograph dated 11/13/2014 and CT dated 10/29/2012 FINDINGS: Two views of the chest demonstrate an area of chronic parenchymal changes in the lingula consistent with bronchiectatic changes  seen on the CT. Bilateral pulmonary nodular densities again noted which are more prominent on the right and grossly similar in appearance compared prior study. There is no focal consolidation, pleural effusion, or pneumothorax. The cardiac silhouette is within normal limits. No acute osseous pathology. IMPRESSION: Focal area of grossly stable parenchymal changes and bilateral pulmonary nodular densities corresponding to changes seen on the prior radiograph and CT. No significant interval change. Clinical correlation follow-up recommended. Electronically Signed   By: Anner Crete M.D.   On: 07/18/2015 00:26   Ct Chest Wo Contrast  07/18/2015  CLINICAL DATA:  Subacute onset of generalized weakness and productive cough. Initial encounter. EXAM: CT CHEST WITHOUT CONTRAST TECHNIQUE: Multidetector CT imaging of the chest was performed following the standard protocol without IV contrast. COMPARISON:  Chest radiograph performed 07/17/2015, and CT of the chest performed 06/01/2012 FINDINGS: Patchy nodular opacities are noted within both lungs, with some degree of central lucency, and underlying prominent blebs along the fissures. Additional scattered tree-in-bud opacities are seen. This demonstrates some degree of redistribution since 2014, and is concerning for chronic atypical infection, such as a fungal infection or Mycobacterium avium  intracellulare. No pleural effusion or pneumothorax is identified. The appearance is less typical for malignancy. Diffuse coronary artery calcifications are seen. Diffuse calcification is noted along the descending thoracic aorta. No mediastinal lymphadenopathy is appreciated. Scattered calcification noted along the proximal great vessels. No pericardial effusion is identified. The visualized portions of the thyroid gland are unremarkable. No axillary lymphadenopathy is seen. The visualized portions of the liver and spleen are unremarkable. The visualized portions of the adrenal glands are within normal limits. No acute osseous abnormalities are seen. IMPRESSION: 1. Patchy nodular opacities within both lungs, with some degree of central lucency, and underlying prominent blebs along the fissures. Associated scattered tree-in-bud opacity seen. This demonstrates some degree of redistribution since 2014, and is concerning for chronic or recurrent atypical infection, such as a fungal infection or Mycobacterium avium intracellulare. 2. Diffuse coronary artery calcifications seen. Diffuse calcification along the descending thoracic aorta. Electronically Signed   By: Garald Balding M.D.   On: 07/18/2015 04:13     CBC  Recent Labs Lab 07/17/15 2341  WBC 6.9  HGB 11.4*  HCT 33.6*  PLT 274  MCV 86.6  MCH 29.4  MCHC 33.9  RDW 13.1  LYMPHSABS 2.1  MONOABS 1.1*  EOSABS 0.3  BASOSABS 0.1    Chemistries   Recent Labs Lab 07/18/15 0040  NA 133*  K 3.8  CL 98*  CO2 23  GLUCOSE 131*  BUN 22*  CREATININE 0.64  CALCIUM 9.4  AST 18  ALT 13*  ALKPHOS 50  BILITOT 0.4   ------------------------------------------------------------------------------------------------------------------ estimated creatinine clearance is 50 mL/min (by C-G formula based on Cr of 0.64). ------------------------------------------------------------------------------------------------------------------ No results for  input(s): HGBA1C in the last 72 hours. ------------------------------------------------------------------------------------------------------------------ No results for input(s): CHOL, HDL, LDLCALC, TRIG, CHOLHDL, LDLDIRECT in the last 72 hours. ------------------------------------------------------------------------------------------------------------------ No results for input(s): TSH, T4TOTAL, T3FREE, THYROIDAB in the last 72 hours.  Invalid input(s): FREET3 ------------------------------------------------------------------------------------------------------------------ No results for input(s): VITAMINB12, FOLATE, FERRITIN, TIBC, IRON, RETICCTPCT in the last 72 hours.  Coagulation profile No results for input(s): INR, PROTIME in the last 168 hours.  No results for input(s): DDIMER in the last 72 hours.  Cardiac Enzymes No results for input(s): CKMB, TROPONINI, MYOGLOBIN in the last 168 hours.  Invalid input(s): CK ------------------------------------------------------------------------------------------------------------------ Invalid input(s): POCBNP   CBG: No results for input(s): GLUCAP in the last 168 hours.  EKG: Independently reviewed. Sinus rhythm similar to previous  Assessment/Plan  Suspected bronchiectasis exacerbation: Acute on chronic.Patient reports increased cough and sputum production. CT scan showing true putting appearance suggestive of chronic or recurrence of atypical infection. On differential includes a community-acquired pneumonia. - Admit to a telemetry bed - Incentive spirometry  - Airborne precautions - Empiric antibiotics of Zosyn and Rocephin - Checking sputum culture and AFB  - DuoNeb's every 4 hours - Budesonide nebs every 12 hours  Suspect drug reaction rash: Acute. Patient recently reports being placed on 2 different antibiotics over the last 2 weeks. - Stop doxycycline - Hydrocortisone cream prn itching  Suspected urinary retention:  Patient reports complaints of dysuria with urination. UA negative for any signs of bacteria.  - Nursing to bladder scan every 8 hours patient to you greater than 300 mL of urinary retention   Generalized weakness: acute. Suspect patient likely symptoms could be related to recent antibiotics and decreased by mouth appetite.  - IV fluids of normal saline at 75 ml/hour overnight  - Physical therapy to eval and treat   Diabetes mellitus type 2 -Held metformin  -CBGs every before meals with a sensitive sliding scale of insulin   Aemia: Acute. Hemoglobin 11.4 on admission - Continue to monitor   Essential hypertension -Continue amlodipine, Losartan   Hyponatremia: Acute. Sodium mildly decreased at 133 on admission. - IV fluids as seen above  Anxiety/insomnia  - Xanax prn  Code Status:   full Family Communication: bedside Disposition Plan: admit   Total time spent 55 minutes.Greater than 50% of this time was spent in counseling, explanation of diagnosis, planning of further management, and coordination of care  [facility] Pager [phone]  If 7PM-7AM, please contact night-coverage [email or web address] Password [facility] 07/18/2015, 4:38 AM

## 2015-07-19 DIAGNOSIS — J189 Pneumonia, unspecified organism: Secondary | ICD-10-CM

## 2015-07-19 DIAGNOSIS — E871 Hypo-osmolality and hyponatremia: Secondary | ICD-10-CM

## 2015-07-19 DIAGNOSIS — N390 Urinary tract infection, site not specified: Secondary | ICD-10-CM

## 2015-07-19 DIAGNOSIS — A310 Pulmonary mycobacterial infection: Secondary | ICD-10-CM | POA: Insufficient documentation

## 2015-07-19 DIAGNOSIS — R531 Weakness: Secondary | ICD-10-CM

## 2015-07-19 DIAGNOSIS — J471 Bronchiectasis with (acute) exacerbation: Secondary | ICD-10-CM

## 2015-07-19 DIAGNOSIS — B9689 Other specified bacterial agents as the cause of diseases classified elsewhere: Secondary | ICD-10-CM

## 2015-07-19 DIAGNOSIS — A319 Mycobacterial infection, unspecified: Secondary | ICD-10-CM

## 2015-07-19 DIAGNOSIS — I10 Essential (primary) hypertension: Secondary | ICD-10-CM

## 2015-07-19 LAB — CBC
HCT: 29.9 % — ABNORMAL LOW (ref 36.0–46.0)
HEMOGLOBIN: 10 g/dL — AB (ref 12.0–15.0)
MCH: 28.7 pg (ref 26.0–34.0)
MCHC: 33.4 g/dL (ref 30.0–36.0)
MCV: 85.9 fL (ref 78.0–100.0)
Platelets: 221 10*3/uL (ref 150–400)
RBC: 3.48 MIL/uL — AB (ref 3.87–5.11)
RDW: 13.1 % (ref 11.5–15.5)
WBC: 4.2 10*3/uL (ref 4.0–10.5)

## 2015-07-19 LAB — EXPECTORATED SPUTUM ASSESSMENT W REFEX TO RESP CULTURE: SPECIAL REQUESTS: NORMAL

## 2015-07-19 LAB — BASIC METABOLIC PANEL
Anion gap: 8 (ref 5–15)
BUN: 17 mg/dL (ref 6–20)
CALCIUM: 8.8 mg/dL — AB (ref 8.9–10.3)
CHLORIDE: 103 mmol/L (ref 101–111)
CO2: 26 mmol/L (ref 22–32)
CREATININE: 0.61 mg/dL (ref 0.44–1.00)
GFR calc Af Amer: >60 mL/min (ref >60)
GLUCOSE: 129 mg/dL — AB (ref 65–99)
POTASSIUM: 3.8 mmol/L (ref 3.5–5.1)
SODIUM: 137 mmol/L (ref 135–145)

## 2015-07-19 LAB — GLUCOSE, CAPILLARY
GLUCOSE-CAPILLARY: 106 mg/dL — AB (ref 65–99)
GLUCOSE-CAPILLARY: 102 mg/dL — AB (ref 65–99)
Glucose-Capillary: 127 mg/dL — ABNORMAL HIGH (ref 65–99)
Glucose-Capillary: 144 mg/dL — ABNORMAL HIGH (ref 65–99)

## 2015-07-19 LAB — EXPECTORATED SPUTUM ASSESSMENT W GRAM STAIN, RFLX TO RESP C

## 2015-07-19 MED ORDER — AZITHROMYCIN 250 MG PO TABS: 500.0000 mg | ORAL_TABLET | ORAL | Status: DC

## 2015-07-19 MED ORDER — LEVOFLOXACIN 500 MG PO TABS: 500.0000 mg | ORAL_TABLET | ORAL | Status: DC

## 2015-07-19 MED ORDER — LEVOFLOXACIN 500 MG PO TABS
500.0000 mg | ORAL_TABLET | ORAL | Status: DC
  Administered 2015-07-20: 500 mg via ORAL
  Filled 2015-07-19: qty 1

## 2015-07-19 NOTE — Progress Notes (Signed)
INFECTIOUS DISEASE PROGRESS NOTE  ID: Katie Alvarado is a 82 y.o. female with  Principal Problem:   Bronchiectasis with acute exacerbation ([place]) Active Problems:   Hyponatremia   Generalized weakness   Essential hypertension   Anemia  Subjective: Feels better aside from one coughing fit this AM Has ambulated.   Abtx:  Anti-infectives    Start     Dose/Rate Route Frequency Ordered Stop   07/19/15 0600  cefTRIAXone (ROCEPHIN) 2 g in dextrose 5 % 50 mL IVPB     2 g 100 mL/hr over 30 Minutes Intravenous Every 24 hours 07/18/15 0624     07/18/15 0800  azithromycin (ZITHROMAX) 500 mg in dextrose 5 % 250 mL IVPB     500 mg 250 mL/hr over 60 Minutes Intravenous Every 24 hours 07/18/15 0553     07/18/15 0530  cefTRIAXone (ROCEPHIN) 2 g in dextrose 5 % 50 mL IVPB     2 g 100 mL/hr over 30 Minutes Intravenous  Once 07/18/15 0521 07/18/15 0748   07/18/15 0530  azithromycin (ZITHROMAX) tablet 500 mg  Status:  Discontinued     500 mg Oral  Once 07/18/15 0521 07/18/15 [id number]      Medications:  Scheduled: . allopurinol  100 mg Oral Daily  . amLODipine  2.5 mg Oral Daily  . aspirin EC  81 mg Oral q morning - 10a  . azithromycin  500 mg Intravenous Q24H  . budesonide  0.25 mg Nebulization Daily  . cefTRIAXone (ROCEPHIN)  IV  2 g Intravenous Q24H  . enoxaparin (LOVENOX) injection  40 mg Subcutaneous Q24H  . insulin aspart  0-9 Units Subcutaneous TID WC  . ipratropium-albuterol  3 mL Nebulization QID  . losartan  50 mg Oral Daily  . pantoprazole  80 mg Oral Q1200  . sodium chloride flush  3 mL Intravenous Q12H  . vitamin E  400 Units Oral Daily    Objective: Vital signs in last 24 hours: Temp:  [97.6 F (36.4 C)-97.8 F (36.6 C)] 97.8 F (36.6 C) (03/29 1259) Pulse Rate:  [78-84] 79 (03/29 1259) Resp:  [16] 16 (03/29 1259) BP: (122-127)/(54-71) 127/55 mmHg (03/29 1259) SpO2:  [96 %-98 %] 97 % (03/29 1259)   General appearance: alert, cooperative and no distress Resp:  diminished breath sounds bilaterally and rhonchi bilaterally Cardio: regular rate and rhythm GI: normal findings: bowel sounds normal and soft, non-tender Extremities: edema trace. non-pitting  Lab Results  Recent Labs  07/17/15 2341 07/18/15 0040 07/19/15 0439  WBC 6.9  --  4.2  HGB 11.4*  --  10.0*  HCT 33.6*  --  29.9*  NA  --  133* 137  K  --  3.8 3.8  CL  --  98* 103  CO2  --  23 26  BUN  --  22* 17  CREATININE  --  0.64 0.61   Liver Panel  Recent Labs  07/18/15 0040  PROT 6.5  ALBUMIN 3.6  AST 18  ALT 13*  ALKPHOS 50  BILITOT 0.4   Sedimentation Rate No results for input(s): ESRSEDRATE in the last 72 hours. C-Reactive Protein No results for input(s): CRP in the last 72 hours.  Microbiology: Recent Results (from the past 240 hour(s))  Culture, blood (Routine X 2) w Reflex to ID Panel     Status: None (Preliminary result)   Collection Time: 07/18/15  6:48 AM  Result Value Ref Range Status   Specimen Description BLOOD RIGHT WRIST  Final  Special Requests IN PEDIATRIC BOTTLE 3CC  Final   Culture   Final    NO GROWTH 1 DAY Performed at [facility]    Report Status PENDING  Incomplete  Culture, blood (Routine X 2) w Reflex to ID Panel     Status: None (Preliminary result)   Collection Time: 07/18/15  6:50 AM  Result Value Ref Range Status   Specimen Description BLOOD LEFT ARM  Final   Special Requests IN PEDIATRIC BOTTLE 4CC  Final   Culture   Final    NO GROWTH 1 DAY Performed at [facility]    Report Status PENDING  Incomplete  Culture, expectorated sputum-assessment     Status: None   Collection Time: 07/19/15  6:21 AM  Result Value Ref Range Status   Specimen Description SPUTUM  Final   Special Requests NONE  Final   Sputum evaluation   Final    MICROSCOPIC FINDINGS SUGGEST THAT THIS SPECIMEN IS NOT REPRESENTATIVE OF LOWER RESPIRATORY SECRETIONS. PLEASE RECOLLECT.   Report Status 07/19/2015 FINAL  Final     Studies/Results: Dg Chest 2 View  07/18/2015  CLINICAL DATA:  82 year old female with COPD and cough. EXAM: CHEST  2 VIEW COMPARISON:  Chest radiograph dated 11/13/2014 and CT dated 10/29/2012 FINDINGS: Two views of the chest demonstrate an area of chronic parenchymal changes in the lingula consistent with bronchiectatic changes seen on the CT. Bilateral pulmonary nodular densities again noted which are more prominent on the right and grossly similar in appearance compared prior study. There is no focal consolidation, pleural effusion, or pneumothorax. The cardiac silhouette is within normal limits. No acute osseous pathology. IMPRESSION: Focal area of grossly stable parenchymal changes and bilateral pulmonary nodular densities corresponding to changes seen on the prior radiograph and CT. No significant interval change. Clinical correlation follow-up recommended. Electronically Signed   By: Anner Crete M.D.   On: 07/18/2015 00:26   Ct Chest Wo Contrast  07/18/2015  CLINICAL DATA:  Subacute onset of generalized weakness and productive cough. Initial encounter. EXAM: CT CHEST WITHOUT CONTRAST TECHNIQUE: Multidetector CT imaging of the chest was performed following the standard protocol without IV contrast. COMPARISON:  Chest radiograph performed 07/17/2015, and CT of the chest performed 06/01/2012 FINDINGS: Patchy nodular opacities are noted within both lungs, with some degree of central lucency, and underlying prominent blebs along the fissures. Additional scattered tree-in-bud opacities are seen. This demonstrates some degree of redistribution since 2014, and is concerning for chronic atypical infection, such as a fungal infection or Mycobacterium avium intracellulare. No pleural effusion or pneumothorax is identified. The appearance is less typical for malignancy. Diffuse coronary artery calcifications are seen. Diffuse calcification is noted along the descending thoracic aorta. No mediastinal  lymphadenopathy is appreciated. Scattered calcification noted along the proximal great vessels. No pericardial effusion is identified. The visualized portions of the thyroid gland are unremarkable. No axillary lymphadenopathy is seen. The visualized portions of the liver and spleen are unremarkable. The visualized portions of the adrenal glands are within normal limits. No acute osseous abnormalities are seen. IMPRESSION: 1. Patchy nodular opacities within both lungs, with some degree of central lucency, and underlying prominent blebs along the fissures. Associated scattered tree-in-bud opacity seen. This demonstrates some degree of redistribution since 2014, and is concerning for chronic or recurrent atypical infection, such as a fungal infection or Mycobacterium avium intracellulare. 2. Diffuse coronary artery calcifications seen. Diffuse calcification along the descending thoracic aorta. Electronically Signed   By: Francoise Schaumann.D.  On: 07/18/2015 04:13     Assessment/Plan: CAP MAI Recurrent UTI  Total days of antibiotics: 1 ceftriaxone/azithro  Will repeat sputum Cx, and sputum AFB (not TB) She would like to re-start treatment to see if it will ameliorate her sx (it may).  Will change her atbx to quinolone/azithro. She can start the rifabutin when she f/u in ID.          Bobby Rumpf Infectious Diseases (pager) [phone] [email or web address] 07/19/2015, 4:04 PM  LOS: 1 day

## 2015-07-19 NOTE — Progress Notes (Signed)
PROGRESS NOTE  Katie Alvarado [id number] DOB: 19-Apr-1926 DOA: 07/17/2015 PCP: Gennette Pac, MD  Outpatient Specialists: Pulmonology: Dr. Cristy Hilts course: 82 year old female with history of bronchiectasis, GERD, HTN, DM 2, COPD, MAI-intolerant to and did not complete treatment in 10/2012 (clarithromycin/rifabutin/ethambutol) and required hospitalization for weakness, this was then changed to levofloxacin/azithromycin/rifabutin which she tolerated only for 3-4 weeks, recently treated for UTI, has history of chronic cough, presented to ED with complaints of generalized weakness, increased cough with increased production of clear sputum but no fever, chills or night sweats. States that she had lost 10 pounds recently but has gained back 4 pounds. CT chest significant for tree-in-bud appearance and worse from prior. Admitted for community-acquired pneumonia and possible MAI. Infectious disease consulted.  Assessment & Plan:  MAI of lungs/bronchiectasis - Patient remains on airborne isolation. - CT findings as below. - Infectious disease consultation appreciated and have discussed at length with patient. She is currently tolerating azithromycin  -levofloxacin added 3/29;  CTX discontinued  Community-acquired pneumonia - As per ID, in the interim continue treatment for community-acquired pneumonia.  Recent UTI - Has been on treatment for this as outpatient. Current UA not consistent with infection and would not treat.   Suspected drug rash - ? Secondary to doxycycline. DC. When necessary hydrocortisone cream for itching. -improved  Generalized weakness/? Adult failure to thrive - Feels stronger. As per PT, home health PT recommended.  DM 2 - SSI. Hold oral medications.  Anemia - Follow CBCs.  Essential hypertension - Controlled    DVT prophylaxis: Lovenox Code Status: Full Family Communication: Discussed with patient's friend at bedside on  3/28 Disposition Plan: DC home when cleared by ID         Procedures/Studies: Dg Chest 2 View  07/18/2015  CLINICAL DATA:  82 year old female with COPD and cough. EXAM: CHEST  2 VIEW COMPARISON:  Chest radiograph dated 11/13/2014 and CT dated 10/29/2012 FINDINGS: Two views of the chest demonstrate an area of chronic parenchymal changes in the lingula consistent with bronchiectatic changes seen on the CT. Bilateral pulmonary nodular densities again noted which are more prominent on the right and grossly similar in appearance compared prior study. There is no focal consolidation, pleural effusion, or pneumothorax. The cardiac silhouette is within normal limits. No acute osseous pathology. IMPRESSION: Focal area of grossly stable parenchymal changes and bilateral pulmonary nodular densities corresponding to changes seen on the prior radiograph and CT. No significant interval change. Clinical correlation follow-up recommended. Electronically Signed   By: Anner Crete M.D.   On: 07/18/2015 00:26   Ct Chest Wo Contrast  07/18/2015  CLINICAL DATA:  Subacute onset of generalized weakness and productive cough. Initial encounter. EXAM: CT CHEST WITHOUT CONTRAST TECHNIQUE: Multidetector CT imaging of the chest was performed following the standard protocol without IV contrast. COMPARISON:  Chest radiograph performed 07/17/2015, and CT of the chest performed 06/01/2012 FINDINGS: Patchy nodular opacities are noted within both lungs, with some degree of central lucency, and underlying prominent blebs along the fissures. Additional scattered tree-in-bud opacities are seen. This demonstrates some degree of redistribution since 2014, and is concerning for chronic atypical infection, such as a fungal infection or Mycobacterium avium intracellulare. No pleural effusion or pneumothorax is identified. The appearance is less typical for malignancy. Diffuse coronary artery calcifications are seen. Diffuse calcification  is noted along the descending thoracic aorta. No mediastinal lymphadenopathy is appreciated. Scattered calcification noted along the proximal great  vessels. No pericardial effusion is identified. The visualized portions of the thyroid gland are unremarkable. No axillary lymphadenopathy is seen. The visualized portions of the liver and spleen are unremarkable. The visualized portions of the adrenal glands are within normal limits. No acute osseous abnormalities are seen. IMPRESSION: 1. Patchy nodular opacities within both lungs, with some degree of central lucency, and underlying prominent blebs along the fissures. Associated scattered tree-in-bud opacity seen. This demonstrates some degree of redistribution since 2014, and is concerning for chronic or recurrent atypical infection, such as a fungal infection or Mycobacterium avium intracellulare. 2. Diffuse coronary artery calcifications seen. Diffuse calcification along the descending thoracic aorta. Electronically Signed   By: Garald Balding M.D.   On: 07/18/2015 04:13         Subjective: Patient continues to complain of a nonproductive cough. Denies any hemoptysis, fevers, chills, nausea, vomiting, diarrhea, abdominal pain. No dysuria or hematuria.  Objective: Filed Vitals:   07/18/15 2048 07/19/15 0509 07/19/15 0938 07/19/15 1259  BP: 122/54 127/71  127/55  Pulse: 84 78  79  Temp: 97.6 F (36.4 C) 97.7 F (36.5 C)  97.8 F (36.6 C)  TempSrc: Oral Oral  Oral  Resp: 16 16  16   Height:      Weight:      SpO2: 96% 98% 97% 97%    Intake/Output Summary (Last 24 hours) at 07/19/15 1816 Last data filed at 07/19/15 0906  Gross per 24 hour  Intake 1349.17 ml  Output      0 ml  Net 1349.17 ml   Weight change:  Exam:   General:  Pt is alert, follows commands appropriately, not in acute distress  HEENT: No icterus, No thrush, No neck mass, [place]/AT  Cardiovascular: RRR, S1/S2, no rubs, no gallops  Respiratory: bibasilar rales without  wheezing. Diminished breath sounds bilateral.  Abdomen: Soft/+BS, non tender, non distended, no guarding  Extremities: No edema, No lymphangitis, No petechiae, No rashes, no synovitis  Data Reviewed: Basic Metabolic Panel:  Recent Labs Lab 07/18/15 0040 07/19/15 0439  NA 133* 137  K 3.8 3.8  CL 98* 103  CO2 23 26  GLUCOSE 131* 129*  BUN 22* 17  CREATININE 0.64 0.61  CALCIUM 9.4 8.8*   Liver Function Tests:  Recent Labs Lab 07/18/15 0040  AST 18  ALT 13*  ALKPHOS 50  BILITOT 0.4  PROT 6.5  ALBUMIN 3.6    Recent Labs Lab 07/18/15 0040  LIPASE 39   No results for input(s): AMMONIA in the last 168 hours. CBC:  Recent Labs Lab 07/17/15 2341 07/19/15 0439  WBC 6.9 4.2  NEUTROABS 3.3  --   HGB 11.4* 10.0*  HCT 33.6* 29.9*  MCV 86.6 85.9  PLT 274 221   Cardiac Enzymes: No results for input(s): CKTOTAL, CKMB, CKMBINDEX, TROPONINI in the last 168 hours. BNP: Invalid input(s): POCBNP CBG:  Recent Labs Lab 07/18/15 1717 07/18/15 2054 07/19/15 0746 07/19/15 1216 07/19/15 1631  GLUCAP 158* 121* 106* 102* 127*    Recent Results (from the past 240 hour(s))  Culture, blood (Routine X 2) w Reflex to ID Panel     Status: None (Preliminary result)   Collection Time: 07/18/15  6:48 AM  Result Value Ref Range Status   Specimen Description BLOOD RIGHT WRIST  Final   Special Requests IN PEDIATRIC BOTTLE 3CC  Final   Culture   Final    NO GROWTH 1 DAY Performed at [facility]    Report Status PENDING  Incomplete  Culture, blood (Routine X 2) w Reflex to ID Panel     Status: None (Preliminary result)   Collection Time: 07/18/15  6:50 AM  Result Value Ref Range Status   Specimen Description BLOOD LEFT ARM  Final   Special Requests IN PEDIATRIC BOTTLE 4CC  Final   Culture   Final    NO GROWTH 1 DAY Performed at [facility]    Report Status PENDING  Incomplete  Culture, expectorated sputum-assessment     Status: None   Collection Time:  07/19/15  6:21 AM  Result Value Ref Range Status   Specimen Description SPUTUM  Final   Special Requests NONE  Final   Sputum evaluation   Final    MICROSCOPIC FINDINGS SUGGEST THAT THIS SPECIMEN IS NOT REPRESENTATIVE OF LOWER RESPIRATORY SECRETIONS. PLEASE RECOLLECT.   Report Status 07/19/2015 FINAL  Final     Scheduled Meds: . allopurinol  100 mg Oral Daily  . amLODipine  2.5 mg Oral Daily  . aspirin EC  81 mg Oral q morning - 10a  . [START ON 07/21/2015] azithromycin  500 mg Oral Q M,W,F  . budesonide  0.25 mg Nebulization Daily  . enoxaparin (LOVENOX) injection  40 mg Subcutaneous Q24H  . insulin aspart  0-9 Units Subcutaneous TID WC  . ipratropium-albuterol  3 mL Nebulization QID  . [START ON 07/20/2015] levofloxacin  500 mg Oral Once per day on Tue Thu Sat  . losartan  50 mg Oral Daily  . pantoprazole  80 mg Oral Q1200  . sodium chloride flush  3 mL Intravenous Q12H  . vitamin E  400 Units Oral Daily   Continuous Infusions:    Moraima Burd, DO  [facility] Pager [phone]  If 7PM-7AM, please contact night-coverage [email or web address] Password [email or web address] 07/19/2015, 6:16 PM   LOS: 1 day

## 2015-07-20 ENCOUNTER — Ambulatory Visit: Payer: Medicare Other | Admitting: Internal Medicine

## 2015-07-20 LAB — BASIC METABOLIC PANEL
ANION GAP: 9 (ref 5–15)
BUN: 12 mg/dL (ref 6–20)
CO2: 24 mmol/L (ref 22–32)
Calcium: 8.7 mg/dL — ABNORMAL LOW (ref 8.9–10.3)
Chloride: 104 mmol/L (ref 101–111)
Creatinine, Ser: 0.55 mg/dL (ref 0.44–1.00)
Glucose, Bld: 126 mg/dL — ABNORMAL HIGH (ref 65–99)
POTASSIUM: 3.8 mmol/L (ref 3.5–5.1)
SODIUM: 137 mmol/L (ref 135–145)

## 2015-07-20 LAB — GLUCOSE, CAPILLARY
Glucose-Capillary: 107 mg/dL — ABNORMAL HIGH (ref 65–99)
Glucose-Capillary: 133 mg/dL — ABNORMAL HIGH (ref 65–99)

## 2015-07-20 MED ORDER — LEVOFLOXACIN 500 MG PO TABS: 500.0000 mg | ORAL_TABLET | ORAL | Status: DC

## 2015-07-20 MED ORDER — AZITHROMYCIN 500 MG PO TABS
500.0000 mg | ORAL_TABLET | ORAL | Status: DC
Start: 2015-07-21 — End: 2015-09-05

## 2015-07-20 MED ORDER — LEVOFLOXACIN 500 MG PO TABS
500.0000 mg | ORAL_TABLET | ORAL | Status: DC
Start: 2015-07-22 — End: 2015-09-05

## 2015-07-20 NOTE — Progress Notes (Signed)
INFECTIOUS DISEASE PROGRESS NOTE  ID: Katie Alvarado is a 82 y.o. female with  Principal Problem:   Bronchiectasis with acute exacerbation ([place]) Active Problems:   Hyponatremia   Generalized weakness   Essential hypertension   Anemia   Atypical mycobacterial infection of lung  Subjective: Still some cough. Has been ambulating  Abtx:  Anti-infectives    Start     Dose/Rate Route Frequency Ordered Stop   07/21/15 1000  azithromycin (ZITHROMAX) tablet 500 mg     500 mg Oral Every M-W-F 07/19/15 1718     07/20/15 1000  azithromycin (ZITHROMAX) tablet 500 mg  Status:  Discontinued     500 mg Oral Once per day on Tue Thu Sat 07/19/15 1715 07/19/15 1718   07/20/15 1000  levofloxacin (LEVAQUIN) tablet 500 mg     500 mg Oral Once per day on Tue Thu Sat 07/19/15 1718     07/19/15 1800  azithromycin (ZITHROMAX) tablet 500 mg  Status:  Discontinued     500 mg Oral Once per day on Mon Wed Fri 07/19/15 1622 07/19/15 1715   07/19/15 1800  levofloxacin (LEVAQUIN) tablet 500 mg  Status:  Discontinued     500 mg Oral Once per day on Mon Wed Fri 07/19/15 1622 07/19/15 1718   07/19/15 0600  cefTRIAXone (ROCEPHIN) 2 g in dextrose 5 % 50 mL IVPB  Status:  Discontinued     2 g 100 mL/hr over 30 Minutes Intravenous Every 24 hours 07/18/15 0624 07/19/15 1622   07/18/15 0800  azithromycin (ZITHROMAX) 500 mg in dextrose 5 % 250 mL IVPB  Status:  Discontinued     500 mg 250 mL/hr over 60 Minutes Intravenous Every 24 hours 07/18/15 0553 07/19/15 1622   07/18/15 0530  cefTRIAXone (ROCEPHIN) 2 g in dextrose 5 % 50 mL IVPB     2 g 100 mL/hr over 30 Minutes Intravenous  Once 07/18/15 0521 07/18/15 0748   07/18/15 0530  azithromycin (ZITHROMAX) tablet 500 mg  Status:  Discontinued     500 mg Oral  Once 07/18/15 0521 07/18/15 [id number]      Medications:  Scheduled: . allopurinol  100 mg Oral Daily  . amLODipine  2.5 mg Oral Daily  . aspirin EC  81 mg Oral q morning - 10a  . [START ON 07/21/2015]  azithromycin  500 mg Oral Q M,W,F  . budesonide  0.25 mg Nebulization Daily  . enoxaparin (LOVENOX) injection  40 mg Subcutaneous Q24H  . insulin aspart  0-9 Units Subcutaneous TID WC  . ipratropium-albuterol  3 mL Nebulization QID  . levofloxacin  500 mg Oral Once per day on Tue Thu Sat  . losartan  50 mg Oral Daily  . pantoprazole  80 mg Oral Q1200  . sodium chloride flush  3 mL Intravenous Q12H  . vitamin E  400 Units Oral Daily    Objective: Vital signs in last 24 hours: Temp:  [97.8 F (36.6 C)-98 F (36.7 C)] 97.8 F (36.6 C) (03/30 0522) Pulse Rate:  [78-85] 80 (03/30 1137) Resp:  [16-18] 18 (03/30 1137) BP: (127-159)/(55-76) 156/69 mmHg (03/30 0522) SpO2:  [95 %-98 %] 97 % (03/30 1137)   General appearance: alert, cooperative and no distress Resp: rales bilaterally Cardio: regular rate and rhythm GI: normal findings: bowel sounds normal and soft, non-tender  Lab Results  Recent Labs  07/17/15 2341  07/19/15 0439 07/20/15 0448  WBC 6.9  --  4.2  --   HGB 11.4*  --  10.0*  --   HCT 33.6*  --  29.9*  --   NA  --   < > 137 137  K  --   < > 3.8 3.8  CL  --   < > 103 104  CO2  --   < > 26 24  BUN  --   < > 17 12  CREATININE  --   < > 0.61 0.55  < > = values in this interval not displayed. Liver Panel  Recent Labs  07/18/15 0040  PROT 6.5  ALBUMIN 3.6  AST 18  ALT 13*  ALKPHOS 50  BILITOT 0.4   Sedimentation Rate No results for input(s): ESRSEDRATE in the last 72 hours. C-Reactive Protein No results for input(s): CRP in the last 72 hours.  Microbiology: Recent Results (from the past 240 hour(s))  Culture, blood (Routine X 2) w Reflex to ID Panel     Status: None (Preliminary result)   Collection Time: 07/18/15  6:48 AM  Result Value Ref Range Status   Specimen Description BLOOD RIGHT WRIST  Final   Special Requests IN PEDIATRIC BOTTLE 3CC  Final   Culture   Final    NO GROWTH 1 DAY Performed at [facility]    Report Status PENDING   Incomplete  Culture, blood (Routine X 2) w Reflex to ID Panel     Status: None (Preliminary result)   Collection Time: 07/18/15  6:50 AM  Result Value Ref Range Status   Specimen Description BLOOD LEFT ARM  Final   Special Requests IN PEDIATRIC BOTTLE 4CC  Final   Culture   Final    NO GROWTH 1 DAY Performed at [facility]    Report Status PENDING  Incomplete  Culture, expectorated sputum-assessment     Status: None   Collection Time: 07/19/15  6:21 AM  Result Value Ref Range Status   Specimen Description SPUTUM  Final   Special Requests NONE  Final   Sputum evaluation   Final    MICROSCOPIC FINDINGS SUGGEST THAT THIS SPECIMEN IS NOT REPRESENTATIVE OF LOWER RESPIRATORY SECRETIONS. PLEASE RECOLLECT.   Report Status 07/19/2015 FINAL  Final  Culture, expectorated sputum-assessment     Status: None   Collection Time: 07/19/15  9:44 PM  Result Value Ref Range Status   Specimen Description SPUTUM  Final   Special Requests Normal  Final   Sputum evaluation   Final    MICROSCOPIC FINDINGS SUGGEST THAT THIS SPECIMEN IS NOT REPRESENTATIVE OF LOWER RESPIRATORY SECRETIONS. PLEASE RECOLLECT. Gram Stain Report Called to,Read Back By and Verified With: Linden Dolin RN 3.29.17 @ 2228 BY RICEJ    Report Status 07/19/2015 FINAL  Final    Studies/Results: No results found.   Assessment/Plan: CAP MAI Recurrent UTI  Total days of antibiotics: 2 levaquin/azithro  Will repeat sputum Cx, and sputum AFB (not TB) She states she is coughing deeply for these specimens Appreciate pharm eval regarding possible QT prolongation with her current anbx.  QT was normal yesterday.  May need to repeat after she has been on atbx She can start the rifabutin when she f/u in ID.  Appreciate Dr Doristine Devoid care of pt       Available as needed.    Bobby Rumpf Infectious Diseases (pager) [phone] [email or web address] 07/20/2015, 11:48 AM  LOS: 2 days

## 2015-07-20 NOTE — Discharge Summary (Signed)
Physician Discharge Summary  Katie Alvarado [id number] DOB: 26-Dec-1925 DOA: 07/17/2015  PCP: Gennette Pac, MD  Admit date: 07/17/2015 Discharge date: 07/20/2015  Recommendations for Outpatient Follow-up:  1. Pt will need to follow up with PCP in 2 weeks post discharge 2. Please obtain CMP and CBC in 2 week 3. Follow up ID in 2 weeks  Discharge Diagnoses:  MAI of lungs/bronchiectasis - 07/18/2015 CT chest patchy nodular infiltrates bilateral with central lucencies. Tree-in-bud appearance. - Infectious disease consultation appreciated and have discussed at length with patient. She is currently tolerating azithromycin  -levofloxacin added 3/29; CTX discontinued -ppatient will go home with azithromycin 500 mg on Monday, Wednesday, Friday;  She will take levofloxacin 500 mg on Tuesday, Thursday, Saturday -Follow up in infectious disease office in 2 weeks  Community-acquired pneumonia - As per ID, in the interim continue treatment for community-acquired pneumonia.  Recent UTI - Has been on treatment for this as outpatient. Current UA not consistent with infection and would not treat.   Suspected drug rash - ? Secondary to doxycycline. DC. When necessary hydrocortisone cream for itching. -improved  Generalized weakness/? Adult failure to thrive - Feels stronger. As per PT, home health PT recommended.  DM 2 - SSI. Hold oral medications during hospitalization -CBGs well controlled  Anemia - Follow CBCs.  Essential hypertension - Controlled  Discharge Condition: stable  Disposition: home  Diet:carb modified Wt Readings from Last 3 Encounters:  07/17/15 68.04 kg (150 lb)  05/09/15 69.854 kg (154 lb)  04/25/15 69.854 kg (154 lb)    History of present illness:  82 year old female with history of bronchiectasis, GERD, HTN, DM 2, COPD, MAI-intolerant to and did not complete treatment in 10/2012 (clarithromycin/rifabutin/ethambutol) and required hospitalization for  weakness, this was then changed to levofloxacin/azithromycin/rifabutin which she tolerated only for 3-4 weeks, recently treated for UTI, has history of chronic cough, presented to ED with complaints of generalized weakness, increased cough with increased production of clear sputum but no fever, chills or night sweats. States that she had lost 10 pounds recently but has gained back 4 pounds. CT chest significant for tree-in-bud appearance and worse from prior. Admitted for community-acquired pneumonia and possible MAI. Infectious disease consulted.  abx were adjusted.  The patient improved clinically.  ID recommended home with levofloxacin and azithromycin.  If she continues to tolerate the regimen, rifabutin will be added when she follows up in ID office  Consultants: ID--[place]  Discharge Exam: Filed Vitals:   07/20/15 0842 07/20/15 1137  BP:    Pulse: 78 80  Temp:    Resp: 18 18   Filed Vitals:   07/20/15 0522 07/20/15 0842 07/20/15 0846 07/20/15 1137  BP: 156/69     Pulse: 85 78  80  Temp: 97.8 F (36.6 C)     TempSrc: Oral     Resp: 16 18  18   Height:      Weight:      SpO2: 95% 98% 98% 97%   General: A&O x 3, NAD, pleasant, cooperative Cardiovascular: RRR, no rub, no gallop, no S3 Respiratory: bibasilar rales without any wheezing. Abdomen:soft, nontender, nondistended, positive bowel sounds Extremities: No edema, No lymphangitis, no petechiae  Discharge Instructions      Discharge Instructions    AMB Referral to [place] [facility]    Complete by:  As directed   Please assign to [place] for transition of care. Written consent signed. History COPD, HTN, DM. Please call with questions. Currently at [facility]. Thanks.  Marthenia Rolling, MSN-Ed, RN,BSN [facility] [id number]  Reason for consult:  Please assign to [facility] RNCM  Diagnoses of:  COPD/ Pneumonia  Expected date of contact:  1-3 days (reserved for hospital discharges)     Diet - low  sodium heart healthy    Complete by:  As directed      Increase activity slowly    Complete by:  As directed             Medication List    STOP taking these medications        cyclobenzaprine 10 MG tablet  Commonly known as:  FLEXERIL     doxycycline 100 MG capsule  Commonly known as:  VIBRAMYCIN     HYDROcodone-homatropine 5-1.5 MG/5ML syrup  Commonly known as:  HYCODAN     oxyCODONE-acetaminophen 5-325 MG tablet  Commonly known as:  PERCOCET/ROXICET      TAKE these medications        acetaminophen 325 MG tablet  Commonly known as:  TYLENOL  Take 2 tablets (650 mg total) by mouth every 6 (six) hours as needed for pain.     allopurinol 100 MG tablet  Commonly known as:  ZYLOPRIM  Take 1 tablet by mouth daily.     ALPRAZolam 0.25 MG tablet  Commonly known as:  XANAX  Take 0.25 mg by mouth 2 (two) times daily as needed for sleep or anxiety. For anxiety.     amLODipine 2.5 MG tablet  Commonly known as:  NORVASC  Take 1 tablet (2.5 mg total) by mouth daily.     aspirin EC 81 MG tablet  Take 81 mg by mouth every morning.     azithromycin 500 MG tablet  Commonly known as:  ZITHROMAX  Take 1 tablet (500 mg total) by mouth every Monday, Wednesday, and Friday.  Start taking on:  07/21/2015     benzonatate 200 MG capsule  Commonly known as:  TESSALON  Take 1 capsule (200 mg total) by mouth 3 (three) times daily as needed for cough.     budesonide 0.25 MG/2ML nebulizer solution  Commonly known as:  PULMICORT  Take 2 mLs (0.25 mg total) by nebulization daily.     chlorpheniramine-HYDROcodone 10-8 MG/5ML Suer  Commonly known as:  TUSSIONEX PENNKINETIC ER  Take 5 mLs by mouth every 12 (twelve) hours as needed for cough.     cholecalciferol 1000 units tablet  Commonly known as:  VITAMIN D  Take 2,000 Units by mouth every morning.     Cinnamon 500 MG capsule  Take 500 mg by mouth every morning.     DRY EYES OP  Apply 1 drop to eye as needed (for dry eyes).      esomeprazole 40 MG capsule  Commonly known as:  NEXIUM  Take 40 mg by mouth every morning.     Fish Oil 1000 MG Caps  Take 1 capsule by mouth daily.     ipratropium 0.02 % nebulizer solution  Commonly known as:  ATROVENT  Take 2.5 mLs (500 mcg total) by nebulization 4 (four) times daily.     levofloxacin 500 MG tablet  Commonly known as:  LEVAQUIN  Take 1 tablet (500 mg total) by mouth every Tuesday, Thursday, and Saturday at 6 PM.  Start taking on:  07/22/2015     losartan 100 MG tablet  Commonly known as:  COZAAR  Take 50 mg by mouth daily.     meclizine 25 MG tablet  Commonly  known as:  ANTIVERT  Take 25 mg by mouth every 6 (six) hours as needed for dizziness.     metFORMIN 500 MG tablet  Commonly known as:  GLUCOPHAGE  Take 750 mg by mouth 2 (two) times daily.     PROAIR HFA 108 (90 Base) MCG/ACT inhaler  Generic drug:  albuterol  INHALE 2 PUFFS INTO THE LUNGS EVERY 6 HOURS AS NEEDED FOR WHEEZING OR SHORTNESS OF BREATH     traMADol 50 MG tablet  Commonly known as:  ULTRAM  Take 50 mg by mouth every 6 (six) hours as needed for moderate pain.     triamterene-hydrochlorothiazide 75-50 MG tablet  Commonly known as:  MAXZIDE  Take 0.5 tablets by mouth every other day.     vitamin E 400 UNIT capsule  Take 400 Units by mouth daily.         The results of significant diagnostics from this hospitalization (including imaging, microbiology, ancillary and laboratory) are listed below for reference.    Significant Diagnostic Studies: Dg Chest 2 View  07/18/2015  CLINICAL DATA:  82 year old female with COPD and cough. EXAM: CHEST  2 VIEW COMPARISON:  Chest radiograph dated 11/13/2014 and CT dated 10/29/2012 FINDINGS: Two views of the chest demonstrate an area of chronic parenchymal changes in the lingula consistent with bronchiectatic changes seen on the CT. Bilateral pulmonary nodular densities again noted which are more prominent on the right and grossly similar in appearance  compared prior study. There is no focal consolidation, pleural effusion, or pneumothorax. The cardiac silhouette is within normal limits. No acute osseous pathology. IMPRESSION: Focal area of grossly stable parenchymal changes and bilateral pulmonary nodular densities corresponding to changes seen on the prior radiograph and CT. No significant interval change. Clinical correlation follow-up recommended. Electronically Signed   By: Anner Crete M.D.   On: 07/18/2015 00:26   Ct Chest Wo Contrast  07/18/2015  CLINICAL DATA:  Subacute onset of generalized weakness and productive cough. Initial encounter. EXAM: CT CHEST WITHOUT CONTRAST TECHNIQUE: Multidetector CT imaging of the chest was performed following the standard protocol without IV contrast. COMPARISON:  Chest radiograph performed 07/17/2015, and CT of the chest performed 06/01/2012 FINDINGS: Patchy nodular opacities are noted within both lungs, with some degree of central lucency, and underlying prominent blebs along the fissures. Additional scattered tree-in-bud opacities are seen. This demonstrates some degree of redistribution since 2014, and is concerning for chronic atypical infection, such as a fungal infection or Mycobacterium avium intracellulare. No pleural effusion or pneumothorax is identified. The appearance is less typical for malignancy. Diffuse coronary artery calcifications are seen. Diffuse calcification is noted along the descending thoracic aorta. No mediastinal lymphadenopathy is appreciated. Scattered calcification noted along the proximal great vessels. No pericardial effusion is identified. The visualized portions of the thyroid gland are unremarkable. No axillary lymphadenopathy is seen. The visualized portions of the liver and spleen are unremarkable. The visualized portions of the adrenal glands are within normal limits. No acute osseous abnormalities are seen. IMPRESSION: 1. Patchy nodular opacities within both lungs, with some  degree of central lucency, and underlying prominent blebs along the fissures. Associated scattered tree-in-bud opacity seen. This demonstrates some degree of redistribution since 2014, and is concerning for chronic or recurrent atypical infection, such as a fungal infection or Mycobacterium avium intracellulare. 2. Diffuse coronary artery calcifications seen. Diffuse calcification along the descending thoracic aorta. Electronically Signed   By: Garald Balding M.D.   On: 07/18/2015 04:13     Microbiology:  Recent Results (from the past 240 hour(s))  Culture, blood (Routine X 2) w Reflex to ID Panel     Status: None (Preliminary result)   Collection Time: 07/18/15  6:48 AM  Result Value Ref Range Status   Specimen Description BLOOD RIGHT WRIST  Final   Special Requests IN PEDIATRIC BOTTLE 3CC  Final   Culture   Final    NO GROWTH 1 DAY Performed at [facility]    Report Status PENDING  Incomplete  Culture, blood (Routine X 2) w Reflex to ID Panel     Status: None (Preliminary result)   Collection Time: 07/18/15  6:50 AM  Result Value Ref Range Status   Specimen Description BLOOD LEFT ARM  Final   Special Requests IN PEDIATRIC BOTTLE 4CC  Final   Culture   Final    NO GROWTH 1 DAY Performed at [facility]    Report Status PENDING  Incomplete  Culture, expectorated sputum-assessment     Status: None   Collection Time: 07/19/15  6:21 AM  Result Value Ref Range Status   Specimen Description SPUTUM  Final   Special Requests NONE  Final   Sputum evaluation   Final    MICROSCOPIC FINDINGS SUGGEST THAT THIS SPECIMEN IS NOT REPRESENTATIVE OF LOWER RESPIRATORY SECRETIONS. PLEASE RECOLLECT.   Report Status 07/19/2015 FINAL  Final  Culture, expectorated sputum-assessment     Status: None   Collection Time: 07/19/15  9:44 PM  Result Value Ref Range Status   Specimen Description SPUTUM  Final   Special Requests Normal  Final   Sputum evaluation   Final    MICROSCOPIC FINDINGS  SUGGEST THAT THIS SPECIMEN IS NOT REPRESENTATIVE OF LOWER RESPIRATORY SECRETIONS. PLEASE RECOLLECT. Gram Stain Report Called to,Read Back By and Verified With: Linden Dolin RN 3.29.17 @ [place]    Report Status 07/19/2015 FINAL  Final     Labs: Basic Metabolic Panel:  Recent Labs Lab 07/18/15 0040 07/19/15 0439 07/20/15 0448  NA 133* 137 137  K 3.8 3.8 3.8  CL 98* 103 104  CO2 23 26 24   GLUCOSE 131* 129* 126*  BUN 22* 17 12  CREATININE 0.64 0.61 0.55  CALCIUM 9.4 8.8* 8.7*   Liver Function Tests:  Recent Labs Lab 07/18/15 0040  AST 18  ALT 13*  ALKPHOS 50  BILITOT 0.4  PROT 6.5  ALBUMIN 3.6    Recent Labs Lab 07/18/15 0040  LIPASE 39   No results for input(s): AMMONIA in the last 168 hours. CBC:  Recent Labs Lab 07/17/15 2341 07/19/15 0439  WBC 6.9 4.2  NEUTROABS 3.3  --   HGB 11.4* 10.0*  HCT 33.6* 29.9*  MCV 86.6 85.9  PLT 274 221   Cardiac Enzymes: No results for input(s): CKTOTAL, CKMB, CKMBINDEX, TROPONINI in the last 168 hours. BNP: Invalid input(s): POCBNP CBG:  Recent Labs Lab 07/19/15 1216 07/19/15 1631 07/19/15 2136 07/20/15 0729 07/20/15 1223  GLUCAP 102* 127* 144* 107* 133*    Time coordinating discharge:  Greater than 30 minutes  Signed:  Kasiyah Platter, DO [facility] Pager: [phone] 07/20/2015, 1:13 PM

## 2015-07-20 NOTE — Progress Notes (Signed)
Physical Therapy Treatment Patient Details Name: Katie Alvarado MRN: [id number] DOB: [birth year] Today's Date: 04-Aug-2015    History of Present Illness 82 y.o. female with h/o lumbar decompression 2014, DM2, HTN, 2 recent UTIs admitted with bronchiectasis, lung CT showed possible fungal infection.    PT Comments    Pt states she can't produce anything but clear sputum for specimen sample.   Pt agreeable to mobilize as she is hopeful for d/c as soon as possible.  Follow Up Recommendations  Home health PT     Equipment Recommendations  None recommended by PT    Recommendations for Other Services       Precautions / Restrictions Precautions Precautions: Fall Restrictions Weight Bearing Restrictions: No    Mobility  Bed Mobility Overal bed mobility: Modified Independent                Transfers Overall transfer level: Needs assistance Equipment used: None Transfers: Sit to/from Stand Sit to Stand: Min guard         General transfer comment: verbal cues for hand placement  Ambulation/Gait Ambulation/Gait assistance: Min guard Ambulation Distance (Feet): 60 Feet Assistive device: 1 person hand held assist Gait Pattern/deviations: Step-through pattern;Decreased stride length     General Gait Details: provided HHA and pt steady, performed a few laps around the room until pt fatigued   Stairs            Wheelchair Mobility    Modified Rankin (Stroke Patients Only)       Balance                                    Cognition Arousal/Alertness: Awake/alert Behavior During Therapy: WFL for tasks assessed/performed Overall Cognitive Status: Within Functional Limits for tasks assessed                      Exercises      General Comments        Pertinent Vitals/Pain Pain Assessment: No/denies pain    Home Living                      Prior Function            PT Goals (current goals can now be found in  the care plan section) Progress towards PT goals: Progressing toward goals    Frequency  Min 3X/week    PT Plan Current plan remains appropriate    Co-evaluation             End of Session   Activity Tolerance: Patient tolerated treatment well Patient left: with call bell/phone within reach;in bed;with bed alarm set     Time: 0955-1009 PT Time Calculation (min) (ACUTE ONLY): 14 min  Charges:  $Gait Training: 8-22 mins                    G Codes:      Jennifr Gaeta,KATHrine E August 04, 2015, 1:03 PM Carmelia Bake, PT, DPT 08-04-15 Pager: [phone]

## 2015-07-21 ENCOUNTER — Encounter: Payer: Self-pay | Admitting: *Deleted

## 2015-07-21 ENCOUNTER — Other Ambulatory Visit: Payer: Self-pay | Admitting: *Deleted

## 2015-07-21 NOTE — Patient Outreach (Signed)
Transition of care call #1 completed. Pt resides alone. She is very independent. She has a friend that will take her to appointments at this time, although she does have a car and still has her license. She has all meds, understands her discharge instructions, has her follow up appt.   I provided her with my name and phone number and I encouraged her to call me if she has any questions or problems. I will call her again next Friday.  [name] [facility] [place] [phone]

## 2015-07-23 LAB — CULTURE, BLOOD (ROUTINE X 2)
CULTURE: NO GROWTH
Culture: NO GROWTH

## 2015-07-25 ENCOUNTER — Other Ambulatory Visit: Payer: Self-pay | Admitting: *Deleted

## 2015-07-25 NOTE — Patient Outreach (Signed)
[place] [facility]) Care Management  07/25/2015  Katie Alvarado 14-Jul-1925 [id number]   Patient triggered RED on EMMI COPD Dashboard, notification sent to [name], NP.  Katie Alvarado, Katie Alvarado. [place], [place] Assistant Phone: [phone] Fax: [phone]

## 2015-07-25 NOTE — Patient Outreach (Signed)
Pt triggered RED FLAGS on her Emmi COPD call. I called pt and she did not answer. I left a message and requested that she return my call.  [name] [facility] [place] [phone]

## 2015-07-28 ENCOUNTER — Ambulatory Visit: Payer: Self-pay | Admitting: *Deleted

## 2015-07-28 ENCOUNTER — Other Ambulatory Visit: Payer: Self-pay | Admitting: *Deleted

## 2015-07-28 DIAGNOSIS — E119 Type 2 diabetes mellitus without complications: Secondary | ICD-10-CM | POA: Diagnosis not present

## 2015-07-28 DIAGNOSIS — A310 Pulmonary mycobacterial infection: Secondary | ICD-10-CM | POA: Diagnosis not present

## 2015-07-28 DIAGNOSIS — R21 Rash and other nonspecific skin eruption: Secondary | ICD-10-CM | POA: Diagnosis not present

## 2015-07-28 DIAGNOSIS — D649 Anemia, unspecified: Secondary | ICD-10-CM | POA: Diagnosis not present

## 2015-07-28 DIAGNOSIS — Z7984 Long term (current) use of oral hypoglycemic drugs: Secondary | ICD-10-CM | POA: Diagnosis not present

## 2015-07-28 NOTE — Patient Outreach (Signed)
Transition of care call #2 attempted, pt did not answer but I did leave a message for her to return my call.  [name] [facility] [place] [phone]

## 2015-07-31 ENCOUNTER — Other Ambulatory Visit: Payer: Self-pay | Admitting: *Deleted

## 2015-07-31 NOTE — Patient Outreach (Signed)
[facility]) Care Management   07/31/2015  KENEDI CILIA 04-18-1926 [id number]  Katie Alvarado is an 82 y.o. female  Subjective:   Objective:   Review of Systems  HENT: Negative.   Eyes:       Low vision related to macular degeneration.  Respiratory: Positive for cough and sputum production.   Cardiovascular: Negative.   Gastrointestinal: Negative.   Genitourinary: Negative.   Musculoskeletal: Positive for back pain.  Skin: Negative.   Neurological: Positive for dizziness and weakness.  Endo/Heme/Allergies: Negative.   Psychiatric/Behavioral: Negative.    BP 104/60 mmHg  Pulse 78  Resp 18  Ht 1.727 m ('5\' 8"'$ )  Wt 154 lb (69.854 kg)  BMI 23.42 kg/m2  SpO2 97%  Physical Exam  Constitutional: She is oriented to person, place, and time. She appears well-developed and well-nourished.  HENT:  Head: Normocephalic.  Cardiovascular: Normal rate, regular rhythm and normal heart sounds.   Respiratory: Effort normal.  Deep cough easily stimulated by deep breath.  Musculoskeletal: Normal range of motion.  Neurological: She is alert and oriented to person, place, and time.  Skin: Skin is warm and dry.  Psychiatric: She has a normal mood and affect. Her behavior is normal. Judgment and thought content normal.    Encounter Medications:   Outpatient Encounter Prescriptions as of 07/31/2015  Medication Sig Note  . acetaminophen (TYLENOL) 325 MG tablet Take 2 tablets (650 mg total) by mouth every 6 (six) hours as needed for pain. (Patient taking differently: Take 325 mg by mouth every 6 (six) hours as needed for pain. ) 07/21/2015: Takes this prn.  . allopurinol (ZYLOPRIM) 100 MG tablet Take 1 tablet by mouth daily.   Marland Kitchen ALPRAZolam (XANAX) 0.25 MG tablet Take 0.25 mg by mouth 2 (two) times daily as needed for sleep or anxiety. For anxiety.   Marland Kitchen amLODipine (NORVASC) 2.5 MG tablet Take 1 tablet (2.5 mg total) by mouth daily.   . Artificial Tear Ointment (DRY EYES OP)  Apply 1 drop to eye as needed (for dry eyes).   Marland Kitchen aspirin EC 81 MG tablet Take 81 mg by mouth every morning.   Marland Kitchen azithromycin (ZITHROMAX) 500 MG tablet Take 1 tablet (500 mg total) by mouth every Monday, Wednesday, and Friday.   . benzonatate (TESSALON) 200 MG capsule Take 1 capsule (200 mg total) by mouth 3 (three) times daily as needed for cough.   . budesonide (PULMICORT) 0.25 MG/2ML nebulizer solution Take 2 mLs (0.25 mg total) by nebulization daily.   . cholecalciferol (VITAMIN D) 1000 UNITS tablet Take 2,000 Units by mouth every morning.    . Cinnamon 500 MG capsule Take 500 mg by mouth every morning.    Marland Kitchen esomeprazole (NEXIUM) 40 MG capsule Take 40 mg by mouth every morning.    Marland Kitchen ipratropium (ATROVENT) 0.02 % nebulizer solution Take 2.5 mLs (500 mcg total) by nebulization 4 (four) times daily.   Marland Kitchen levofloxacin (LEVAQUIN) 500 MG tablet Take 1 tablet (500 mg total) by mouth every Tuesday, Thursday, and Saturday at 6 PM.   . losartan (COZAAR) 100 MG tablet Take 50 mg by mouth daily.   . meclizine (ANTIVERT) 25 MG tablet Take 25 mg by mouth every 6 (six) hours as needed for dizziness.  07/21/2015: Only prn.  . metFORMIN (GLUCOPHAGE) 500 MG tablet Take 750 mg by mouth 2 (two) times daily.    . Omega-3 Fatty Acids (FISH OIL) 1000 MG CAPS Take 1 capsule by mouth daily.   Marland Kitchen  PROAIR HFA 108 (90 Base) MCG/ACT inhaler INHALE 2 PUFFS INTO THE LUNGS EVERY 6 HOURS AS NEEDED FOR WHEEZING OR SHORTNESS OF BREATH   . traMADol (ULTRAM) 50 MG tablet Take 50 mg by mouth every 6 (six) hours as needed for moderate pain.   Marland Kitchen triamterene-hydrochlorothiazide (MAXZIDE) 75-50 MG per tablet Take 0.5 tablets by mouth every other day.    . vitamin E 400 UNIT capsule Take 400 Units by mouth daily.   . chlorpheniramine-HYDROcodone (TUSSIONEX PENNKINETIC ER) 10-8 MG/5ML SUER Take 5 mLs by mouth every 12 (twelve) hours as needed for cough. (Patient not taking: Reported on 07/21/2015)    No facility-administered encounter  medications on file as of 07/31/2015.    Functional Status:   In your present state of health, do you have any difficulty performing the following activities: 07/21/2015 07/18/2015  Hearing? N N  Vision? Y Y  Difficulty concentrating or making decisions? N N  Walking or climbing stairs? Y Y  Dressing or bathing? N N  Doing errands, shopping? N N  Preparing Food and eating ? N -  Using the Toilet? N -  In the past six months, have you accidently leaked urine? N -  Do you have problems with loss of bowel control? N -  Managing your Medications? N -  Managing your Finances? N -  Housekeeping or managing your Housekeeping? N -    Fall/Depression Screening:    PHQ 2/9 Scores 07/21/2015 11/03/2012 10/08/2012  PHQ - 2 Score 0 0 0   Fall Risk  07/31/2015 11/03/2012 10/08/2012  Falls in the past year? No No No  Risk for fall due to : Impaired balance/gait Impaired mobility;Impaired balance/gait -    Assessment:  Mycobacterium lung infection                         Macular degeneration  Plan:  I am much more confident that pt is doing OK at home. Although her eye sight is so very limiting. We discussed if she would be able to hire some in home assistance, which she would like to consider as opposed to assisted living.  I will contact Dr. Johnnye Sima to see if he wants her antibiotic regimen to continue past the current prescription.  Pt needs pulmicort refilled.  I will call her again in one week and see her in 2 weeks.  THN CM Care Plan Problem One        Most Recent Value   Care Plan Problem One  Recent hospitalization.   Role Documenting the Problem One  Care Management [place] for Problem One  Active   THN CM Short Term Goal #1 (0-30 days)  Pt will not readmit to the hospital in 30 days.   THN CM Short Term Goal #1 Start Date  07/21/15   Interventions for Short Term Goal #1  Reinforced pt to call me if any problems.   THN CM Short Term Goal #2 (0-30 days)  Pt will attend  MD appointment 07/28/15.   THN CM Short Term Goal #2 Start Date  07/21/15   THN CM Short Term Goal #2 Met Date  07/31/15   THN CM Short Term Goal #3 (0-30 days)  Pt will take her 2 antiobiotics as prescribed until further notice.   THN CM Short Term Goal #3 Start Date  07/21/15   Interventions for Short Tern Goal #3  Verified pt is taking these meds.  THN CM Care Plan Problem Two        Most Recent Value   Care Plan Problem Two  No  Advanced Directives   Role Documenting the Problem Two  Care Management Coordinator   Care Plan for Problem Two  Active   THN CM Short Term Goal #1 (0-30 days)  -- [Pt will complete a MOST form and HCPOA in 30 days.]   THN CM Short Term Goal #1 Start Date  07/31/15   Interventions for Short Term Goal #2   Provided and filled out MOST form, Gave HCPOA documents.     [name] [facility] [place] [phone]

## 2015-07-31 NOTE — Patient Outreach (Signed)
Transition of care call #2 completed.  Katie Alvarado tells me she has had some rough days since she came out of the hospital. She has told me some things that make me feel like she has some confusion. She has previously refused a home visit, however, I asked her if I could come so she could get to know me and I her. She has agreed to a home visit this afternoon, so I will see her then.  [name] [facility] [place] [phone]

## 2015-08-01 ENCOUNTER — Encounter: Payer: Self-pay | Admitting: *Deleted

## 2015-08-03 ENCOUNTER — Encounter: Payer: Self-pay | Admitting: *Deleted

## 2015-08-03 NOTE — Patient Outreach (Signed)
[facility]) Care Management  08/03/2015  ZAILA SHANKMAN 1925/04/26 [id number]   Documentation encounter only. I sent an email to Dr. Johnnye Sima asking about his wishes for pt's continued antibiotic tx until her appt with him on 09/05/15.  [name] GNP-BC [place] [phone]  I have also made a referral to a private nurse aid for in home services. I have advised Mrs. Bhardwaj of this referral and told her to expect to hear from this lady. I have advised her that she will need to interveiw her with her friend and she needs to call her references.   I will call pt again next week.  [name] [facility] [place] [phone]

## 2015-08-04 ENCOUNTER — Ambulatory Visit: Payer: Self-pay | Admitting: *Deleted

## 2015-08-07 ENCOUNTER — Other Ambulatory Visit: Payer: Self-pay | Admitting: *Deleted

## 2015-08-07 NOTE — Patient Outreach (Signed)
I received instructions from Dr. Johnnye Sima for Katie Alvarado to refill her antibiotic Rx and continue taking them until she sees him on 09/05/15.   I also talked with Katie Alvarado about considering getting an emergency alert. She is home so much of the time by herself, has low vision and balance issues. This would be a very good piece of equipment and added insurance to have in case of a fall or other accident if she can't get to a phone. She said she would consider this.   I will talk to her again on Friday and have encouraged her to call me if she has any problems or questions.  [name] [facility] [phone]

## 2015-08-11 ENCOUNTER — Other Ambulatory Visit: Payer: Self-pay | Admitting: *Deleted

## 2015-08-11 NOTE — Patient Outreach (Signed)
Transition of care call #3. Pt is having a rough day. Her eye sight seems to be worse today (macular degeneration). She says she has days like this. We discussed her living situation and alternatives she may consider. I encouraged her to get the emergency alert.  Today, she is breathing well. She did not cough at all during our lengthy conversation. She said she is using her nebs routinely.   I have encouraged her to call me if she has any problems or questions. I will call her again next Friday.  [name] [facility] [place] [phone]

## 2015-08-15 DIAGNOSIS — H353223 Exudative age-related macular degeneration, left eye, with inactive scar: Secondary | ICD-10-CM | POA: Diagnosis not present

## 2015-08-15 DIAGNOSIS — H53423 Scotoma of blind spot area, bilateral: Secondary | ICD-10-CM | POA: Diagnosis not present

## 2015-08-15 DIAGNOSIS — H5372 Impaired contrast sensitivity: Secondary | ICD-10-CM | POA: Diagnosis not present

## 2015-08-18 ENCOUNTER — Other Ambulatory Visit: Payer: Self-pay | Admitting: *Deleted

## 2015-08-18 ENCOUNTER — Ambulatory Visit: Payer: Self-pay | Admitting: *Deleted

## 2015-08-18 NOTE — Patient Outreach (Signed)
Transition of care call #4. Pt states she has been vacuuming and it has made her very SOB. We discussed AGAIN consideration of hiring some in home assistance. I know a caregiver that is local to her and I suggested she talk with her about some of her needs and she says she can find someone cheaper than $15 an hour. I asked her how she would find them and she said she would ask around. I told her that she will be very lucky to find someone for less than that. I told her how much housekeeping services charge and she told me that it was too much. She insists she will find someone for less.  I have asked if I can see her again next week and she has agreed to an appt on Thurday, May 4th. I have asked her to call me if she has any problems.  [name] [facility] [phone]

## 2015-08-22 DIAGNOSIS — H353223 Exudative age-related macular degeneration, left eye, with inactive scar: Secondary | ICD-10-CM | POA: Diagnosis not present

## 2015-08-22 DIAGNOSIS — H53423 Scotoma of blind spot area, bilateral: Secondary | ICD-10-CM | POA: Diagnosis not present

## 2015-08-22 DIAGNOSIS — H5372 Impaired contrast sensitivity: Secondary | ICD-10-CM | POA: Diagnosis not present

## 2015-08-24 ENCOUNTER — Ambulatory Visit: Payer: Self-pay | Admitting: *Deleted

## 2015-08-24 ENCOUNTER — Other Ambulatory Visit: Payer: Self-pay | Admitting: *Deleted

## 2015-08-24 NOTE — Patient Outreach (Signed)
Returned pt call. She states she wants to cancel tomorrow's visit. She gave me 4 different reasons for cancelling: 1) I don't feel up to it 2) I have family coming in 3) I don't want to be sitting around waiting for someone 4) I don't really feel I need you.  I told her it was fine to cancel however I also told her that I am concerned about her being alone and having such poor vision and not having regular help in her home. I have previously encouraged her to get an emergency alert, hire someone to come in several days a week for a few hours to assist her. She has not been in favor of these ideas. She also has health issues that make her very vulnerable to greater illness.  I told her I repect her thoughts and her wishes and if she wished for me to close her case that is fine. Then, she said, "No, don't do that." So I suggested that I call her back in 2 weeks to check on her and we can decide where we go from there. She agreed to this plan.  [name] [facility] [place] [phone]

## 2015-09-05 ENCOUNTER — Ambulatory Visit (INDEPENDENT_AMBULATORY_CARE_PROVIDER_SITE_OTHER): Payer: Medicare Other | Admitting: Internal Medicine

## 2015-09-05 ENCOUNTER — Encounter: Payer: Self-pay | Admitting: Internal Medicine

## 2015-09-05 VITALS — BP 135/71 | HR 81 | Temp 97.8°F | Wt 168.0 lb

## 2015-09-05 DIAGNOSIS — L298 Other pruritus: Secondary | ICD-10-CM

## 2015-09-05 DIAGNOSIS — L299 Pruritus, unspecified: Secondary | ICD-10-CM | POA: Diagnosis not present

## 2015-09-05 DIAGNOSIS — J471 Bronchiectasis with (acute) exacerbation: Secondary | ICD-10-CM | POA: Diagnosis present

## 2015-09-05 DIAGNOSIS — I701 Atherosclerosis of renal artery: Secondary | ICD-10-CM | POA: Diagnosis not present

## 2015-09-05 DIAGNOSIS — T50905A Adverse effect of unspecified drugs, medicaments and biological substances, initial encounter: Secondary | ICD-10-CM

## 2015-09-05 MED ORDER — AZITHROMYCIN 500 MG PO TABS: 500.0000 mg | ORAL_TABLET | ORAL | Status: DC

## 2015-09-05 MED ORDER — RIFABUTIN 150 MG PO CAPS: 300.0000 mg | ORAL_CAPSULE | ORAL | Status: DC

## 2015-09-05 MED ORDER — LEVOFLOXACIN 500 MG PO TABS: 500.0000 mg | ORAL_TABLET | ORAL | Status: DC

## 2015-09-05 MED ORDER — ONDANSETRON HCL 4 MG PO TABS: 4.0000 mg | ORAL_TABLET | Freq: Three times a day (TID) | ORAL | Status: DC | PRN

## 2015-09-05 MED ORDER — ONDANSETRON HCL 4 MG PO TABS: 4.0000 mg | ORAL_TABLET | ORAL | Status: DC

## 2015-09-05 NOTE — Assessment & Plan Note (Addendum)
Will add rifabutin and see if she tolerates it.  No other alternatives if she does not tolerate it. She will return in 1 month with PharmD to see how she is doing.

## 2015-09-05 NOTE — Progress Notes (Signed)
Patient ID: Katie Alvarado, female   DOB: 12/05/1925, 82 y.o.   MRN: [id number] HPI: Katie Alvarado is a 82 y.o. female who is here f/u of her MAC treatment again.    Allergies: Allergies  Allergen Reactions  . Welchol [Colesevelam Hcl] Hives  . Glucosamine Other (See Comments)    Unknown  . Tramadol Nausea Only  . Celecoxib Hives, Itching and Rash  . Statins Rash    Vitals: Temp: 97.8 F (36.6 C) (05/16 1347) Temp Source: Oral (05/16 1347) BP: 135/71 mmHg (05/16 1347) Pulse Rate: 81 (05/16 1347)  Past Medical History: Past Medical History  Diagnosis Date  . Other symptoms involving nervous and musculoskeletal systems(781.99)   . Insomnia, unspecified   . Cough   . Bronchiectasis with acute exacerbation ([place])   . Acute nasopharyngitis (common cold)   . Esophageal reflux   . Hypertension   . Diabetes mellitus     fasting 130-150  . COPD (chronic obstructive pulmonary disease) ([place])     sees dr. Annamaria Boots   . Shortness of breath     exertion  . Pneumonia     hx of  . Dizziness   . Cancer ([place])     skin cancer  . Arthritis     Social History: Social History   Social History  . Marital Status: Widowed    Spouse Name: N/A  . Number of Children: 2  . Years of Education: N/A   Social History Main Topics  . Smoking status: Never Smoker   . Smokeless tobacco: Never Used  . Alcohol Use: Yes     Comment: "Hardly Ever"   . Drug Use: No  . Sexual Activity: Not Currently    Birth Control/ Protection: Post-menopausal   Other Topics Concern  . None   Social History Narrative    Previous Regimen: Clarithromycin/ethambutol/rifabutin, azithromycin/levaquin/rifabutin  Current Regimen: Levaquin/azithromycin  Labs: No results found for: HIV1RNAQUANT, HIV1RNAVL, CD4TABS, HEPBSAB, HEPBSAG, HCVAB  CrCl: CrCl cannot be calculated (Patient has no serum creatinine result on file.).  Lipids: No results found for: CHOL, TRIG, HDL, CHOLHDL, VLDL,  LDLCALC  Assessment: She has been on multiple regimen before for her MAC but didn't tolerated several times. Subsequently, therapy was stopped. Therapy with azith/levaquin were restarted the plan to add back rifabutin based on tolerability on the 3x week plan. She still confessed of intolerability issues and the number of pills. Convinced her to go with this plan: Azith MWF breakfast, levquin MWF lunch, and rifabutin MWF dinner. We are going to give her a schedule dose of zofran MWF with the first dose of meds. She likes this plan way better than the past. Advised her to take each med with food. She is going to f/u with me in 2 weeks to check up on tolerability.   Recommendations:  Azithromycin 500mg  MWF breakfast Levaquin 500mg  MWF lunch Rifabutin 300mg  MWF dinner Zofran 4mg  MWF with breakfast F/u with Rx in 2-3 wks  Wilfred Lacy, PharmD Clinical [facility] 09/05/2015, 3:11 PM

## 2015-09-05 NOTE — Assessment & Plan Note (Signed)
May be due to the antibiotics.  Will see if she can continue the medications with symptomatic treatment.

## 2015-09-05 NOTE — Progress Notes (Signed)
   Subjective:    Patient ID: Katie Alvarado, female    DOB: Aug 27, 1925, 82 y.o.   MRN: [id number]  HPI Here for hsfu.  Previous diagnosis of MAI infection and bronchiectasis and started initial treatment with ethambutol, clarithromycin and rifabutin.  She did not tolerate this and stopped.  I saw her in 2014 and started her on aizithromycin, levaquin and rifabutin.  She took this for about 1-2 months and initially tolerating but then stopped due to reported intolerance.  She does not remember specific issues, just weakness.  She was then hospitalized recently with exacerbation and is here for follow up.  She was treated for pneumonia with levaquin and azithromycin and has continued with 3 times per week.  The plan was to add rifabutin if she was tolerating and wanted to continue treatment.  She feels better now but is a bit hesitant to continue treatment.     Review of Systems  Constitutional: Positive for fatigue. Negative for fever and chills.  Gastrointestinal: Negative for nausea.  Skin: Negative for rash.       Itching that has been going on since hospitalization.  She equates the medicine as the cause.   Neurological: Negative for dizziness.       Objective:   Physical Exam  Constitutional: She appears well-developed and well-nourished.  Pulmonary/Chest: Effort normal. No respiratory distress. She has no wheezes. She has rales.  Some crackles right mid lobe, some congestion  Skin: No rash noted.          Assessment & Plan:

## 2015-09-21 ENCOUNTER — Telehealth: Payer: Self-pay | Admitting: Pharmacist Clinician (PhC)/ Clinical Pharmacy Specialist

## 2015-09-21 ENCOUNTER — Telehealth: Payer: Self-pay | Admitting: *Deleted

## 2015-09-21 NOTE — Telephone Encounter (Signed)
Patient called stating she has an upcoming appt with pharmacy and thinks she is having a reaction and can not wait for appt. Asked if she feels she needs to go to ED and she said she just wants to talk to the pharmacist. Message given to [place]

## 2015-09-21 NOTE — Telephone Encounter (Signed)
Katie Alvarado restarted her MAC treatment about 2 wks ago. She stated that she doesn't have issue with nausea. However, she is having body aches in the past couple of days. She stated that it could be due to the meds. She has had issues therapy in the past. I told her to take scheduled ibuprofen 400mg  BID until she sees me on Monday. She is going with that plan today. She is going to be tricky to treat with long term therapy.

## 2015-09-22 NOTE — Telephone Encounter (Signed)
Thanks.  She probably won't continue, but we will see.

## 2015-09-23 ENCOUNTER — Emergency Department (HOSPITAL_COMMUNITY): Payer: Medicare Other

## 2015-09-23 ENCOUNTER — Encounter (HOSPITAL_COMMUNITY): Payer: Self-pay | Admitting: Emergency Medicine

## 2015-09-23 ENCOUNTER — Emergency Department (HOSPITAL_COMMUNITY)
Admission: EM | Admit: 2015-09-23 | Discharge: 2015-09-23 | Disposition: A | Discharge status: Home / Self Care | Payer: Medicare Other | Attending: Emergency Medicine | Admitting: Emergency Medicine

## 2015-09-23 DIAGNOSIS — Z79899 Other long term (current) drug therapy: Secondary | ICD-10-CM | POA: Insufficient documentation

## 2015-09-23 DIAGNOSIS — Z7984 Long term (current) use of oral hypoglycemic drugs: Secondary | ICD-10-CM | POA: Diagnosis not present

## 2015-09-23 DIAGNOSIS — Z85828 Personal history of other malignant neoplasm of skin: Secondary | ICD-10-CM | POA: Insufficient documentation

## 2015-09-23 DIAGNOSIS — I10 Essential (primary) hypertension: Secondary | ICD-10-CM | POA: Diagnosis not present

## 2015-09-23 DIAGNOSIS — E119 Type 2 diabetes mellitus without complications: Secondary | ICD-10-CM | POA: Insufficient documentation

## 2015-09-23 DIAGNOSIS — J449 Chronic obstructive pulmonary disease, unspecified: Secondary | ICD-10-CM | POA: Insufficient documentation

## 2015-09-23 DIAGNOSIS — R531 Weakness: Secondary | ICD-10-CM | POA: Insufficient documentation

## 2015-09-23 DIAGNOSIS — Z7982 Long term (current) use of aspirin: Secondary | ICD-10-CM | POA: Insufficient documentation

## 2015-09-23 DIAGNOSIS — J479 Bronchiectasis, uncomplicated: Secondary | ICD-10-CM | POA: Diagnosis not present

## 2015-09-23 LAB — URINALYSIS, ROUTINE W REFLEX MICROSCOPIC
Bilirubin Urine: NEGATIVE
GLUCOSE, UA: NEGATIVE mg/dL
HGB URINE DIPSTICK: NEGATIVE
KETONES UR: NEGATIVE mg/dL
LEUKOCYTES UA: NEGATIVE
Nitrite: NEGATIVE
PROTEIN: 30 mg/dL — AB
Specific Gravity, Urine: 1.016 (ref 1.005–1.030)
pH: 6 (ref 5.0–8.0)

## 2015-09-23 LAB — URINE MICROSCOPIC-ADD ON
Bacteria, UA: NONE SEEN
RBC / HPF: NONE SEEN RBC/hpf (ref 0–5)
WBC, UA: NONE SEEN WBC/hpf (ref 0–5)

## 2015-09-23 LAB — BASIC METABOLIC PANEL
ANION GAP: 11 (ref 5–15)
BUN: 18 mg/dL (ref 6–20)
CALCIUM: 8.4 mg/dL — AB (ref 8.9–10.3)
CHLORIDE: 98 mmol/L — AB (ref 101–111)
CO2: 22 mmol/L (ref 22–32)
Creatinine, Ser: 0.81 mg/dL (ref 0.44–1.00)
GFR calc non Af Amer: >60 mL/min (ref >60)
GLUCOSE: 199 mg/dL — AB (ref 65–99)
Potassium: 4 mmol/L (ref 3.5–5.1)
Sodium: 131 mmol/L — ABNORMAL LOW (ref 135–145)

## 2015-09-23 LAB — CBC
HCT: 34.1 % — ABNORMAL LOW (ref 36.0–46.0)
Hemoglobin: 11.7 g/dL — ABNORMAL LOW (ref 12.0–15.0)
MCH: 29 pg (ref 26.0–34.0)
MCHC: 34.3 g/dL (ref 30.0–36.0)
MCV: 84.4 fL (ref 78.0–100.0)
PLATELETS: 182 10*3/uL (ref 150–400)
RBC: 4.04 MIL/uL (ref 3.87–5.11)
RDW: 13.1 % (ref 11.5–15.5)
WBC: 3 10*3/uL — AB (ref 4.0–10.5)

## 2015-09-23 LAB — I-STAT TROPONIN, ED: TROPONIN I, POC: 0.01 ng/mL (ref 0.00–0.08)

## 2015-09-23 MED ORDER — SODIUM CHLORIDE 0.9 % IV BOLUS (SEPSIS)
1000.0000 mL | Freq: Once | INTRAVENOUS | Status: AC
  Administered 2015-09-23: 1000 mL via INTRAVENOUS

## 2015-09-23 MED ORDER — ACETAMINOPHEN 325 MG PO TABS
650.0000 mg | ORAL_TABLET | Freq: Once | ORAL | Status: AC
  Administered 2015-09-23: 650 mg via ORAL
  Filled 2015-09-23: qty 2

## 2015-09-23 NOTE — ED Provider Notes (Signed)
CSN: [id number]     Arrival date & time 09/23/15  1051 History   First MD Initiated Contact with Patient 09/23/15 1123     Chief Complaint  Patient presents with  . Weakness   HPI   Katie Alvarado is a 82 y.o. female PMH of MAI (currently on treatment with azithromycin, levaquin, rifabutin), HTN, DM, COPD, SOB and arthritis, who presents to the Emergency Department complaining of generalized weakness for the past 2 months. She also has an occasionally productive cough that is now worsening. Pt denies any pain or fever. She endorses chills. Patient states she can no longer do her normal activities due to her severe fatigue and weakness each day. She states "I feel like a newborn calf that is unable to use its legs."   Past Medical History  Diagnosis Date  . Other symptoms involving nervous and musculoskeletal systems(781.99)   . Insomnia, unspecified   . Cough   . Bronchiectasis with acute exacerbation ([place])   . Acute nasopharyngitis (common cold)   . Esophageal reflux   . Hypertension   . Diabetes mellitus     fasting 130-150  . COPD (chronic obstructive pulmonary disease) ([place])     sees dr. Annamaria Boots   . Shortness of breath     exertion  . Pneumonia     hx of  . Dizziness   . Cancer ([place])     skin cancer  . Arthritis    Past Surgical History  Procedure Laterality Date  . Abdominal hysterectomy    . Appendectomy    . Colon surgery      colon resection for diverticulitis  . Ankle fracture surgery Left   . Lumbar laminectomy/decompression microdiscectomy N/A 02/15/2013    Procedure: LUMBAR LAMINECTOMY/DECOMPRESSION MICRODISCECTOMY LUMBAR FOUR-FIVE;  Surgeon: Otilio Connors, MD;  Location: [place] [facility];  Service: Neurosurgery;  Laterality: N/A;  . Cataract extraction, bilateral      OUT PATIENT  . Basal cell carcinoma excision      NOSE  . Varicose vein surgery     Family History  Problem Relation Age of Onset  . Chronic bronchitis Father   . Other Sister     heart trouble   . Other Brother     heart trouble   Social History  Substance Use Topics  . Smoking status: Never Smoker   . Smokeless tobacco: Never Used  . Alcohol Use: Yes     Comment: "Hardly Ever"    OB History    No data available     Review of Systems  Ten systems are reviewed and are negative for acute change except as noted in the HPI  Allergies  Welchol; Glucosamine; Tramadol; Celecoxib; and Statins  Home Medications   Prior to Admission medications   Medication Sig Start Date End Date Taking? Authorizing Provider  acetaminophen (TYLENOL) 325 MG tablet Take 2 tablets (650 mg total) by mouth every 6 (six) hours as needed for pain. Patient taking differently: Take 325 mg by mouth every 6 (six) hours as needed for pain.  08/05/12   Eugenie Filler, MD  allopurinol (ZYLOPRIM) 100 MG tablet Take 1 tablet by mouth daily. 06/07/13   Historical Provider, MD  ALPRAZolam Duanne Moron) 0.25 MG tablet Take 0.25 mg by mouth 2 (two) times daily as needed for sleep or anxiety. For anxiety.    Historical Provider, MD  amLODipine (NORVASC) 2.5 MG tablet Take 1 tablet (2.5 mg total) by mouth daily. 04/25/15   Thana Farr  Skains, MD  Artificial Tear Ointment (DRY EYES OP) Apply 1 drop to eye as needed (for dry eyes).    Historical Provider, MD  aspirin EC 81 MG tablet Take 81 mg by mouth every morning.    Historical Provider, MD  azithromycin (ZITHROMAX) 500 MG tablet Take 1 tablet (500 mg total) by mouth every Monday, Wednesday, and Friday. With breakfast 09/05/15   Thayer Headings, MD  benzonatate (TESSALON) 200 MG capsule Take 1 capsule (200 mg total) by mouth 3 (three) times daily as needed for cough. 03/20/15   Deneise Lever, MD  budesonide (PULMICORT) 0.25 MG/2ML nebulizer solution Take 2 mLs (0.25 mg total) by nebulization daily. 10/31/14   Deneise Lever, MD  chlorpheniramine-HYDROcodone (TUSSIONEX PENNKINETIC ER) 10-8 MG/5ML SUER Take 5 mLs by mouth every 12 (twelve) hours as needed for cough. 03/20/15    Deneise Lever, MD  cholecalciferol (VITAMIN D) 1000 UNITS tablet Take 2,000 Units by mouth every morning.     Historical Provider, MD  Cinnamon 500 MG capsule Take 500 mg by mouth every morning.     Historical Provider, MD  esomeprazole (NEXIUM) 40 MG capsule Take 40 mg by mouth every morning.     Historical Provider, MD  ipratropium (ATROVENT) 0.02 % nebulizer solution Take 2.5 mLs (500 mcg total) by nebulization 4 (four) times daily. 11/01/14   Deneise Lever, MD  levofloxacin (LEVAQUIN) 500 MG tablet Take 1 tablet (500 mg total) by mouth every Monday, Wednesday, and Friday. With lunch 09/05/15   Thayer Headings, MD  losartan (COZAAR) 100 MG tablet Take 50 mg by mouth daily. 03/01/15   Historical Provider, MD  meclizine (ANTIVERT) 25 MG tablet Take 25 mg by mouth every 6 (six) hours as needed for dizziness.  03/14/15   Historical Provider, MD  metFORMIN (GLUCOPHAGE) 500 MG tablet Take 750 mg by mouth 2 (two) times daily.     Historical Provider, MD  Omega-3 Fatty Acids (FISH OIL) 1000 MG CAPS Take 1 capsule by mouth daily.    Historical Provider, MD  ondansetron (ZOFRAN) 4 MG tablet Take 1 tablet (4 mg total) by mouth every Monday, Wednesday, and Friday. With breakfast 09/05/15   Thayer Headings, MD  ondansetron (ZOFRAN) 4 MG tablet Take 1 tablet (4 mg total) by mouth every 8 (eight) hours as needed for nausea or vomiting. 09/05/15   Thayer Headings, MD  PROAIR HFA 108 [phone] Base) MCG/ACT inhaler INHALE 2 PUFFS INTO THE LUNGS EVERY 6 HOURS AS NEEDED FOR WHEEZING OR SHORTNESS OF BREATH 07/07/15   Noralee Space, MD  rifabutin [facility]) 150 MG capsule Take 2 capsules (300 mg total) by mouth every Monday, Wednesday, and Friday. With dinner 09/05/15   Thayer Headings, MD  traMADol (ULTRAM) 50 MG tablet Take 50 mg by mouth every 6 (six) hours as needed for moderate pain.    Historical Provider, MD  triamterene-hydrochlorothiazide (MAXZIDE) 75-50 MG per tablet Take 0.5 tablets by mouth every other day.  09/18/11    Historical Provider, MD  vitamin E 400 UNIT capsule Take 400 Units by mouth daily.    Historical Provider, MD   BP 144/64 mmHg  Pulse 80  Temp(Src) 97.8 F (36.6 C) (Oral)  Resp 17  Ht 5' 8.5" (1.74 m)  Wt 68.04 kg  BMI 22.47 kg/m2  SpO2 93% Physical Exam  Constitutional: She is oriented to person, place, and time. She appears well-developed and well-nourished. No distress.  Elderly, frail appearing  HENT:  Head: Normocephalic and atraumatic.  Mouth/Throat: Oropharynx is clear and moist. No oropharyngeal exudate.  Eyes: Conjunctivae are normal. Pupils are equal, round, and reactive to light. Right eye exhibits no discharge. Left eye exhibits no discharge. No scleral icterus.  Neck: No tracheal deviation present.  Cardiovascular: Normal rate, regular rhythm, normal heart sounds and intact distal pulses.  Exam reveals no gallop and no friction rub.   No murmur heard. Pulmonary/Chest: Effort normal and breath sounds normal. No respiratory distress. She has no wheezes. She has no rales. She exhibits no tenderness.  Abdominal: Soft. Bowel sounds are normal. She exhibits no distension and no mass. There is no tenderness. There is no rebound and no guarding.  Musculoskeletal: Normal range of motion. She exhibits no edema.  NVI BL. Strength 5/5 throughout.   Lymphadenopathy:    She has no cervical adenopathy.  Neurological: She is alert and oriented to person, place, and time. Coordination normal.  Cranial nerves II through XII grossly intact  Skin: Skin is warm and dry. No rash noted. She is not diaphoretic. No erythema.  Psychiatric: She has a normal mood and affect. Her behavior is normal.  Nursing note and vitals reviewed.   ED Course  Procedures Labs Review Labs Reviewed  BASIC METABOLIC PANEL - Abnormal; Notable for the following:    Sodium 131 (*)    Chloride 98 (*)    Glucose, Bld 199 (*)    Calcium 8.4 (*)    All other components within normal limits  CBC - Abnormal;  Notable for the following:    WBC 3.0 (*)    Hemoglobin 11.7 (*)    HCT 34.1 (*)    All other components within normal limits  URINALYSIS, ROUTINE W REFLEX MICROSCOPIC (NOT AT [facility]) - Abnormal; Notable for the following:    APPearance CLOUDY (*)    Protein, ur 30 (*)    All other components within normal limits  URINE MICROSCOPIC-ADD ON - Abnormal; Notable for the following:    Squamous Epithelial / LPF 0-5 (*)    All other components within normal limits  CULTURE, BLOOD (ROUTINE X 2)  CULTURE, BLOOD (ROUTINE X 2)  I-STAT TROPOININ, ED    Imaging Review Dg Chest 2 View  09/23/2015  CLINICAL DATA:  Generalized weakness for 2 months. EXAM: CHEST  2 VIEW COMPARISON:  CT 07/18/2015 FINDINGS: Areas of bronchiectasis in the lingula as seen on prior CT. Nodular opacities in the right upper lobe, also seen previously on prior CT. Heart is normal size. No effusions. Diffuse aortic atherosclerosis. No acute bony abnormality. IMPRESSION: Stable areas of bronchiectasis and chronic nodularity in the lungs bilaterally. No definite acute process. Electronically Signed   By: Rolm Baptise M.D.   On: 09/23/2015 12:14   I have personally reviewed and evaluated these images and lab results as part of my medical decision-making.   EKG Interpretation   Date/Time:  Saturday September 23 2015 11:08:57 EDT Ventricular Rate:  77 PR Interval:  182 QRS Duration: 94 QT Interval:  373 QTC Calculation: 422 R Axis:   0 Text Interpretation:  Sinus rhythm Atrial premature complex No acute  changes No significant change since last tracing Confirmed by Kathrynn Humble,  MD, [facility] [phone]) on 09/23/2015 11:33:06 AM      MDM   Final diagnoses:  Weakness   Patient nontoxic-appearing, vital signs stable. Urinalysis with 30 proteinuria, otherwise unremarkable. Troponin, BMP, CBC, chest x-ray, EKG without acute change. Weakness most likely result of medication, as patient had  similar symptoms in 2014 when medication regimen was  suggested. Less likely ACS, infectious etiology. Discussed case with Dr. Kathrynn Humble who evaluated patient as well and agrees with above plan. Spoke with infectious disease who advised outpatient follow-up and discontinuing medication.  Andover Lions, PA-C 09/23/15 [place], MD 09/24/15 [phone]

## 2015-09-23 NOTE — ED Notes (Signed)
Pt is trying again for UA

## 2015-09-23 NOTE — ED Notes (Signed)
Pt reports generalized weakness for the past 2 months since she started treatment for a lung infection. Pt also reports she has been in bed for 3 days and having chills. No fever at home. Also endorses nausea, no emesis.

## 2015-09-23 NOTE — Discharge Instructions (Signed)
Ms. Katie Alvarado,  Nice meeting you! Please follow-up with infectious disease on Monday. Return to the emergency department if you develop chest pain, shortness of breath, inability to walk, new/worsening symptoms. Feel better soon!  S. Wendie Simmer, PA-C   Weakness Weakness is a lack of strength. It may be felt all over the body (generalized) or in one specific part of the body (focal). Some causes of weakness can be serious. You may need further medical evaluation, especially if you are elderly or you have a history of immunosuppression (such as chemotherapy or HIV), kidney disease, heart disease, or diabetes. CAUSES  Weakness can be caused by many different things, including:  Infection.  Physical exhaustion.  Internal bleeding or other blood loss that results in a lack of red blood cells (anemia).  Dehydration. This cause is more common in elderly people.  Side effects or electrolyte abnormalities from medicines, such as pain medicines or sedatives.  Emotional distress, anxiety, or depression.  Circulation problems, especially severe peripheral arterial disease.  Heart disease, such as rapid atrial fibrillation, bradycardia, or heart failure.  Nervous system disorders, such as Guillain-Barr syndrome, multiple sclerosis, or stroke. DIAGNOSIS  To find the cause of your weakness, your caregiver will take your history and perform a physical exam. Lab tests or X-rays may also be ordered, if needed. TREATMENT  Treatment of weakness depends on the cause of your symptoms and can vary greatly. HOME CARE INSTRUCTIONS   Rest as needed.  Eat a well-balanced diet.  Try to get some exercise every day.  Only take over-the-counter or prescription medicines as directed by your caregiver. SEEK MEDICAL CARE IF:   Your weakness seems to be getting worse or spreads to other parts of your body.  You develop new aches or pains. SEEK IMMEDIATE MEDICAL CARE IF:   You cannot perform  your normal daily activities, such as getting dressed and feeding yourself.  You cannot walk up and down stairs, or you feel exhausted when you do so.  You have shortness of breath or chest pain.  You have difficulty moving parts of your body.  You have weakness in only one area of the body or on only one side of the body.  You have a fever.  You have trouble speaking or swallowing.  You cannot control your bladder or bowel movements.  You have black or bloody vomit or stools. MAKE SURE YOU:  Understand these instructions.  Will watch your condition.  Will get help right away if you are not doing well or get worse.   This information is not intended to replace advice given to you by your health care provider. Make sure you discuss any questions you have with your health care provider.   Document Released: 04/08/2005 Document Revised: 10/08/2011 Document Reviewed: 06/07/2011 Elsevier Interactive Patient Education [employer].

## 2015-09-23 NOTE — ED Notes (Signed)
Pt used walker in the room to walk around.

## 2015-09-25 ENCOUNTER — Telehealth: Payer: Self-pay | Admitting: Pharmacist Clinician (PhC)/ Clinical Pharmacy Specialist

## 2015-09-25 ENCOUNTER — Ambulatory Visit: Payer: Medicare Other

## 2015-09-25 NOTE — Telephone Encounter (Signed)
Apparently, Katie Alvarado is having trouble tolerating her regimen again. She went to the ED on Sat and they stated that it could be her meds that is causing the chronic fatigue. She is going to be difficult to treat no matter what due to her age. D/w Dr. Linus Salmons, we told her to stop. She is going to call back for an appt when she is feeling better.

## 2015-09-26 ENCOUNTER — Other Ambulatory Visit: Payer: Self-pay | Admitting: *Deleted

## 2015-09-26 NOTE — Patient Outreach (Signed)
Telephone outreach. Mrs. Katie Alvarado had an ED visit for weakness on 09/23/15. She says she's been in the bed for a week and had had a lot of diarrhea. She says today she has had 5 stool. She has been on an extended antibiotic regimen for about 2 months, so I am worried about cdiff. She had previously told me she didn't need me checking on her but I was not comfortable with closing her case. Today, she says I can come see her tomorrow evening. Mrs. Katie Alvarado doesn't have a great support system and really needs 24 hour support at this time. I will be talking to her about this again when I see her.  [name] [facility] [place] [phone]

## 2015-09-27 ENCOUNTER — Other Ambulatory Visit: Payer: Self-pay | Admitting: *Deleted

## 2015-09-28 LAB — CULTURE, BLOOD (ROUTINE X 2)
CULTURE: NO GROWTH
Culture: NO GROWTH

## 2015-09-29 ENCOUNTER — Other Ambulatory Visit: Payer: Self-pay | Admitting: *Deleted

## 2015-09-29 NOTE — Patient Outreach (Signed)
[facility]) Care Management  09/29/2015  Katie Alvarado Nov 08, 1925 [id number]   Pt has not been well and had an ED visit so I scheduled a home visit for this evening. I called pt to let her know I was on the way and she told me in a very weak, sickly, voice that she is not up to a home visit.  I told her I would call her on Friday to follow up. I also want to call a family member and let them know that I am very concerned about her health condition and her living by herself in this condition with her limited vision.  [name] [facility] [place] [phone]

## 2015-09-29 NOTE — Patient Outreach (Signed)
Telephone outreach. Pt reports she is not well. She reports her diarrhea had subsided but then has come back. She has been taking immodium prn. She is drinking fluids and Gatorade. She has a CNA starting on Monday. Hopefully it will be for several hours a day. She has several other acquaintances that check on her. I have told her I thought she needed to go to the walk in clinic tomorow. She says she is not able to go, nor does she want me to come out. She did say she was thankful for me check in on her today.  I will call her again on Monday.  [name] [facility] [place] [phone]

## 2015-10-02 ENCOUNTER — Other Ambulatory Visit: Payer: Self-pay | Admitting: *Deleted

## 2015-10-02 ENCOUNTER — Encounter: Payer: Self-pay | Admitting: *Deleted

## 2015-10-02 NOTE — Patient Outreach (Signed)
Telephone call to pt. She states she had a much better weekend. She was able to eat and her diarrhea has stopped. She had an assistant this am for 2 hours. She had a lot of people coming to visit her over the weekend. Her daughter is coming to visit her from [place] this week.  Pt reported that her glucose levels were up due to drinking the Gatorade and I advised her that it will be OK for her to do this for a few more days to get her fluid and electrolyte balance back into range.  I advised pt that I am closing her case now and that I will let Dr. Rex Kras know. I advised I would be sending her a closure letter and eventually she will get a survey.  [name] [facility] [place] [phone]

## 2015-10-16 ENCOUNTER — Telehealth: Payer: Self-pay | Admitting: *Deleted

## 2015-10-16 NOTE — Telephone Encounter (Signed)
Patient called to ask Dr. Linus Salmons if she could take one or two antibiotics. She said, "I absolutely refuse to take the other one; it about killed me, but it helped my cough." She thinks she can take just one antibiotic and maybe that will help her. Please advise

## 2015-10-16 NOTE — Telephone Encounter (Signed)
No, all three work together, she can't take just one or two.  It is either all or none. thanks

## 2015-10-17 NOTE — Telephone Encounter (Signed)
Patient notified and she stated she can just not take the three antibiotics. Katie Alvarado

## 2015-10-19 ENCOUNTER — Ambulatory Visit: Payer: Medicare Other | Admitting: Internal Medicine

## 2015-10-27 DIAGNOSIS — J209 Acute bronchitis, unspecified: Secondary | ICD-10-CM | POA: Diagnosis not present

## 2015-10-31 ENCOUNTER — Encounter (HOSPITAL_BASED_OUTPATIENT_CLINIC_OR_DEPARTMENT_OTHER): Payer: Self-pay | Admitting: *Deleted

## 2015-10-31 ENCOUNTER — Emergency Department (HOSPITAL_BASED_OUTPATIENT_CLINIC_OR_DEPARTMENT_OTHER)
Admission: EM | Admit: 2015-10-31 | Discharge: 2015-10-31 | Disposition: A | Discharge status: Home / Self Care | Payer: Medicare Other | Attending: Emergency Medicine | Admitting: Emergency Medicine

## 2015-10-31 ENCOUNTER — Emergency Department (HOSPITAL_BASED_OUTPATIENT_CLINIC_OR_DEPARTMENT_OTHER): Payer: Medicare Other

## 2015-10-31 DIAGNOSIS — R350 Frequency of micturition: Secondary | ICD-10-CM | POA: Diagnosis not present

## 2015-10-31 DIAGNOSIS — Z7984 Long term (current) use of oral hypoglycemic drugs: Secondary | ICD-10-CM | POA: Diagnosis not present

## 2015-10-31 DIAGNOSIS — M549 Dorsalgia, unspecified: Secondary | ICD-10-CM | POA: Diagnosis not present

## 2015-10-31 DIAGNOSIS — J449 Chronic obstructive pulmonary disease, unspecified: Secondary | ICD-10-CM | POA: Diagnosis not present

## 2015-10-31 DIAGNOSIS — M1990 Unspecified osteoarthritis, unspecified site: Secondary | ICD-10-CM | POA: Diagnosis not present

## 2015-10-31 DIAGNOSIS — R809 Proteinuria, unspecified: Secondary | ICD-10-CM | POA: Diagnosis not present

## 2015-10-31 DIAGNOSIS — I10 Essential (primary) hypertension: Secondary | ICD-10-CM | POA: Insufficient documentation

## 2015-10-31 DIAGNOSIS — E119 Type 2 diabetes mellitus without complications: Secondary | ICD-10-CM | POA: Diagnosis not present

## 2015-10-31 DIAGNOSIS — Z79899 Other long term (current) drug therapy: Secondary | ICD-10-CM | POA: Diagnosis not present

## 2015-10-31 DIAGNOSIS — R05 Cough: Secondary | ICD-10-CM | POA: Diagnosis not present

## 2015-10-31 DIAGNOSIS — E871 Hypo-osmolality and hyponatremia: Secondary | ICD-10-CM | POA: Diagnosis not present

## 2015-10-31 DIAGNOSIS — R300 Dysuria: Secondary | ICD-10-CM | POA: Diagnosis not present

## 2015-10-31 DIAGNOSIS — R799 Abnormal finding of blood chemistry, unspecified: Secondary | ICD-10-CM | POA: Diagnosis present

## 2015-10-31 DIAGNOSIS — J209 Acute bronchitis, unspecified: Secondary | ICD-10-CM | POA: Diagnosis not present

## 2015-10-31 DIAGNOSIS — H538 Other visual disturbances: Secondary | ICD-10-CM | POA: Diagnosis not present

## 2015-10-31 LAB — CBC WITH DIFFERENTIAL/PLATELET
BASOS PCT: 0 %
Basophils Absolute: 0 10*3/uL (ref 0.0–0.1)
Eosinophils Absolute: 0 10*3/uL (ref 0.0–0.7)
Eosinophils Relative: 0 %
HEMATOCRIT: 37.1 % (ref 36.0–46.0)
Hemoglobin: 12.7 g/dL (ref 12.0–15.0)
Lymphocytes Relative: 11 %
Lymphs Abs: 1.3 10*3/uL (ref 0.7–4.0)
MCH: 29.2 pg (ref 26.0–34.0)
MCHC: 34.2 g/dL (ref 30.0–36.0)
MCV: 85.3 fL (ref 78.0–100.0)
Monocytes Absolute: 1.4 10*3/uL — ABNORMAL HIGH (ref 0.1–1.0)
Monocytes Relative: 11 %
NEUTROS ABS: 9.3 10*3/uL — AB (ref 1.7–7.7)
NEUTROS PCT: 78 %
Platelets: 447 10*3/uL — ABNORMAL HIGH (ref 150–400)
RBC: 4.35 MIL/uL (ref 3.87–5.11)
RDW: 13.3 % (ref 11.5–15.5)
WBC: 12.1 10*3/uL — ABNORMAL HIGH (ref 4.0–10.5)

## 2015-10-31 LAB — URINALYSIS, ROUTINE W REFLEX MICROSCOPIC
BILIRUBIN URINE: NEGATIVE
Glucose, UA: NEGATIVE mg/dL
HGB URINE DIPSTICK: NEGATIVE
KETONES UR: NEGATIVE mg/dL
NITRITE: NEGATIVE
PH: 6.5 (ref 5.0–8.0)
Protein, ur: 100 mg/dL — AB
SPECIFIC GRAVITY, URINE: 1.016 (ref 1.005–1.030)

## 2015-10-31 LAB — BASIC METABOLIC PANEL
Anion gap: 12 (ref 5–15)
BUN: 24 mg/dL — AB (ref 6–20)
CALCIUM: 9.7 mg/dL (ref 8.9–10.3)
CO2: 25 mmol/L (ref 22–32)
CREATININE: 0.95 mg/dL (ref 0.44–1.00)
Chloride: 92 mmol/L — ABNORMAL LOW (ref 101–111)
GFR calc non Af Amer: 52 mL/min — ABNORMAL LOW (ref >60)
Glucose, Bld: 186 mg/dL — ABNORMAL HIGH (ref 65–99)
Potassium: 3.9 mmol/L (ref 3.5–5.1)
SODIUM: 129 mmol/L — AB (ref 135–145)

## 2015-10-31 LAB — URINE MICROSCOPIC-ADD ON

## 2015-10-31 MED ORDER — SODIUM CHLORIDE 0.9 % IV BOLUS (SEPSIS)
1000.0000 mL | Freq: Once | INTRAVENOUS | Status: AC
  Administered 2015-10-31: 1000 mL via INTRAVENOUS

## 2015-10-31 NOTE — ED Notes (Signed)
Sent here by PMD for "low potassium"  And blurred vision

## 2015-10-31 NOTE — ED Notes (Signed)
MD at bedside. 

## 2015-10-31 NOTE — ED Notes (Signed)
Pt verbalizes understanding of d/c instructions and denies any further needs at this time. 

## 2015-10-31 NOTE — ED Notes (Addendum)
Pt reports she went to the doctor for back pain, blurred vision and increased urination. Pt denies burning with urination. Pt states she has glaucoma but feels like her vision is worse today. Pt denies chest pain. Pt has hacking productive cough but states this is normal. Pt states dr office called her back and reported potassium and sodium were low and she needed to come to the ED

## 2015-10-31 NOTE — Discharge Instructions (Signed)
Follow up with your doctor. Hyponatremia Hyponatremia is when the amount of salt (sodium) in your blood is too low. When sodium levels are low, your cells absorb extra water and they swell. The swelling happens throughout the body, but it mostly affects the brain. CAUSES This condition may be caused by:  Heart, kidney, or liver problems.  Thyroid problems.  Adrenal gland problems.  Metabolic conditions, such as syndrome of inappropriate antidiuretic hormone (SIADH).  Severe vomiting and diarrhea.  Certain medicines or illegal drugs.  Dehydration.  Drinking too much water.  Eating a diet that is low in sodium.  Large burns on your body.  Sweating. RISK FACTORS This condition is more likely to develop in people who:  Have long-term (chronic) kidney disease.  Have heart failure.  Have a medical condition that causes frequent or excessive diarrhea.  Have metabolic conditions, such as Addison disease or SIADH.  Take certain medicines that affect the sodium and fluid balance in the blood. Some of these medicine types include:  Diuretics.  NSAIDs.  Some opioid pain medicines.  Some antidepressants.  Some seizure prevention medicines. SYMPTOMS  Symptoms of this condition include:  Nausea and vomiting.  Confusion.  Lethargy.  Agitation.  Headache.  Seizures.  Unconsciousness.  Appetite loss.  Muscle weakness and cramping.  Feeling weak or light-headed.  Having a rapid heart rate.  Fainting, in severe cases. DIAGNOSIS This condition is diagnosed with a medical history and physical exam. You will also have other tests, including:  Blood tests.  Urine tests. TREATMENT Treatment for this condition depends on the cause. Treatment may include:  Fluids given through an IV tube that is inserted into one of your veins.  Medicines to correct the sodium imbalance. If medicines are causing the condition, the medicines will need to be  adjusted.  Limiting water or fluid intake to get the correct sodium balance. HOME CARE INSTRUCTIONS  Take medicines only as directed by your health care provider. Many medicines can make this condition worse. Talk with your health care provider about any medicines that you are currently taking.  Carefully follow a recommended diet as directed by your health care provider.  Carefully follow instructions from your health care provider about fluid restrictions.  Keep all follow-up visits as directed by your health care provider. This is important.  Do not drink alcohol. SEEK MEDICAL CARE IF:  You develop worsening nausea, fatigue, headache, confusion, or weakness.  Your symptoms go away and then return.  You have problems following the recommended diet. SEEK IMMEDIATE MEDICAL CARE IF:  You have a seizure.  You faint.  You have ongoing diarrhea or vomiting.   This information is not intended to replace advice given to you by your health care provider. Make sure you discuss any questions you have with your health care provider.   Document Released: 03/29/2002 Document Revised: 08/23/2014 Document Reviewed: 04/28/2014 Elsevier Interactive Patient Education [employer].

## 2015-10-31 NOTE — ED Provider Notes (Signed)
CSN: [id number]     Arrival date & time 10/31/15  1757 History  By signing my name below, I, Reola Mosher, attest that this documentation has been prepared under the direction and in the presence of Deno Etienne, DO.  Electronically Signed: Reola Mosher, ED Scribe. 10/31/2015. 6:27 PM.   Chief Complaint  Patient presents with  . Abnormal Lab   Patient is a 82 y.o. female presenting with back pain. The history is provided by the patient and the spouse. No language interpreter was used.  Back Pain Location:  Lumbar spine Quality:  Shooting Radiates to:  Does not radiate Pain severity:  Moderate Pain is:  Worse during the night Onset quality:  Gradual Timing:  Intermittent Progression:  Unchanged Chronicity:  New Context: recent illness   Context: not emotional stress, not falling, not jumping from heights, not lifting heavy objects, not MCA, not MVA, not occupational injury, not pedestrian accident, not physical stress, not recent injury and not twisting   Worsened by:  Urination Associated symptoms: no abdominal pain, no chest pain, no dysuria, no fever and no headaches    HPI Comments: Katie Alvarado is a 82 y.o. female who wears corrective lenses, with a PMHx HTN, DM, COPD, and GERD who presents to the Emergency Department for referral from [facility] after receiving abnormal lab results. Pt notes that she was seen by her PCP today at [facility] where they did blood work on her for her 55-month check up. She states she received a call from them a few hours prior to coming into the ED because her Sodium results were abnormally low. She reports that she has a hx of low Sodium, with an unknown, undiagnosed cause. Pt reports that it had been low "for a while", but it became dangerously low today and that's why the told her to come to the ED. Pt states that she was also having intermittent, shooting lower back pain x 2 days, worsening last night after episodes of frequent urination. She  reports that the pains lasted for only for a a few seconds each time. She notes that she has also been having worsened, acute on chronic blurry vision recently as well. She is currently taking a new course of antibiotics and Prednisone for "a cold" but is unable to specify what she was diagnosed with when she was prescribed those medications. She denies seizures, chest pain, abdominal pain, dysuria, or any other symptoms.  PCP- Hulan Fess, [facility]  Past Medical History  Diagnosis Date  . Other symptoms involving nervous and musculoskeletal systems(781.99)   . Insomnia, unspecified   . Cough   . Bronchiectasis with acute exacerbation ([place])   . Acute nasopharyngitis (common cold)   . Esophageal reflux   . Hypertension   . Diabetes mellitus     fasting 130-150  . COPD (chronic obstructive pulmonary disease) ([place])     sees dr. Annamaria Boots   . Shortness of breath     exertion  . Pneumonia     hx of  . Dizziness   . Cancer ([place])     skin cancer  . Arthritis    Past Surgical History  Procedure Laterality Date  . Abdominal hysterectomy    . Appendectomy    . Colon surgery      colon resection for diverticulitis  . Ankle fracture surgery Left   . Lumbar laminectomy/decompression microdiscectomy N/A 02/15/2013    Procedure: LUMBAR LAMINECTOMY/DECOMPRESSION MICRODISCECTOMY LUMBAR FOUR-FIVE;  Surgeon: Otilio Connors, MD;  Location: [place] NEURO ORS;  Service: Neurosurgery;  Laterality: N/A;  . Cataract extraction, bilateral      OUT PATIENT  . Basal cell carcinoma excision      NOSE  . Varicose vein surgery     Family History  Problem Relation Age of Onset  . Chronic bronchitis Father   . Other Sister     heart trouble  . Other Brother     heart trouble   Social History  Substance Use Topics  . Smoking status: Never Smoker   . Smokeless tobacco: Never Used  . Alcohol Use: Yes     Comment: "Hardly Ever"    OB History    No data available     Review of Systems   Constitutional: Negative for fever and chills.  HENT: Negative for congestion and rhinorrhea.   Eyes: Positive for visual disturbance. Negative for redness.  Respiratory: Negative for shortness of breath and wheezing.   Cardiovascular: Negative for chest pain and palpitations.  Gastrointestinal: Negative for nausea, vomiting and abdominal pain.  Genitourinary: Positive for frequency. Negative for dysuria and urgency.  Musculoskeletal: Positive for back pain. Negative for myalgias and arthralgias.  Skin: Negative for pallor and wound.  Neurological: Negative for dizziness, seizures and headaches.   Allergies  Welchol; Glucosamine; Tramadol; Celecoxib; and Statins  Home Medications   Prior to Admission medications   Medication Sig Start Date End Date Taking? Authorizing Provider  predniSONE (DELTASONE) 20 MG tablet Take 20 mg by mouth daily with breakfast.   Yes Historical Provider, MD  acetaminophen (TYLENOL) 325 MG tablet Take 2 tablets (650 mg total) by mouth every 6 (six) hours as needed for pain. Patient taking differently: Take 325 mg by mouth every 6 (six) hours as needed for pain.  08/05/12   Eugenie Filler, MD  allopurinol (ZYLOPRIM) 100 MG tablet Take 1 tablet by mouth daily. 06/07/13   Historical Provider, MD  ALPRAZolam Duanne Moron) 0.25 MG tablet Take 0.25 mg by mouth 2 (two) times daily as needed for sleep or anxiety. For anxiety.    Historical Provider, MD  Artificial Tear Ointment (DRY EYES OP) Apply 1 drop to eye 2 (two) times daily as needed (for dry eyes).     Historical Provider, MD  aspirin EC 81 MG tablet Take 81 mg by mouth every morning.    Historical Provider, MD  budesonide (PULMICORT) 0.25 MG/2ML nebulizer solution Take 2 mLs (0.25 mg total) by nebulization daily. Patient taking differently: Take 0.25 mg by nebulization 3 (three) times daily as needed (for shortness of breath).  10/31/14   Deneise Lever, MD  chlorpheniramine-HYDROcodone (TUSSIONEX PENNKINETIC ER)  10-8 MG/5ML SUER Take 5 mLs by mouth every 12 (twelve) hours as needed for cough. 03/20/15   Deneise Lever, MD  cholecalciferol (VITAMIN D) 1000 UNITS tablet Take 2,000 Units by mouth every morning.     Historical Provider, MD  Cinnamon 500 MG capsule Take 500 mg by mouth every morning.     Historical Provider, MD  esomeprazole (NEXIUM) 40 MG capsule Take 40 mg by mouth every morning.     Historical Provider, MD  ibuprofen (ADVIL,MOTRIN) 200 MG tablet Take 200-400 mg by mouth every 6 (six) hours as needed for headache or moderate pain.    Historical Provider, MD  ipratropium (ATROVENT) 0.02 % nebulizer solution Take 2.5 mLs (500 mcg total) by nebulization 4 (four) times daily. 11/01/14   Deneise Lever, MD  losartan (COZAAR) 50 MG tablet Take 50 mg  by mouth daily. 09/20/15   Historical Provider, MD  meclizine (ANTIVERT) 25 MG tablet Take 25 mg by mouth every 6 (six) hours as needed for dizziness.  03/14/15   Historical Provider, MD  metFORMIN (GLUCOPHAGE) 500 MG tablet Take 750 mg by mouth 2 (two) times daily.     Historical Provider, MD  Omega-3 Fatty Acids (FISH OIL) 1000 MG CAPS Take 1 capsule by mouth daily.    Historical Provider, MD  ondansetron (ZOFRAN) 4 MG tablet Take 1 tablet (4 mg total) by mouth every Monday, Wednesday, and Friday. With breakfast 09/05/15   Thayer Headings, MD  PROAIR HFA 108 [phone] Base) MCG/ACT inhaler INHALE 2 PUFFS INTO THE LUNGS EVERY 6 HOURS AS NEEDED FOR WHEEZING OR SHORTNESS OF BREATH 07/07/15   Noralee Space, MD  rifabutin [facility]) 150 MG capsule Take 2 capsules (300 mg total) by mouth every Monday, Wednesday, and Friday. With dinner 09/05/15   Thayer Headings, MD  traMADol (ULTRAM) 50 MG tablet Take 50 mg by mouth every 6 (six) hours as needed for moderate pain.    Historical Provider, MD  triamterene-hydrochlorothiazide (MAXZIDE) 75-50 MG per tablet Take 0.5 tablets by mouth every other day.  09/18/11   Historical Provider, MD  vitamin E 400 UNIT capsule Take 400  Units by mouth daily.    Historical Provider, MD   BP 159/76 mmHg  Pulse 79  Temp(Src) 98.7 F (37.1 C) (Oral)  Resp 16  Ht 5\' 8"  (1.727 m)  Wt 150 lb (68.04 kg)  BMI 22.81 kg/m2  SpO2 96%   Physical Exam  Constitutional: She is oriented to person, place, and time. She appears well-developed and well-nourished. No distress.  HENT:  Head: Normocephalic and atraumatic.  Eyes: EOM are normal. Pupils are equal, round, and reactive to light.  Neck: Normal range of motion. Neck supple.  Cardiovascular: Normal rate, regular rhythm and normal heart sounds.  Exam reveals no gallop and no friction rub.   No murmur heard. Pulmonary/Chest: Effort normal and breath sounds normal. No respiratory distress. She has no wheezes. She has no rales.  Clear lung sounds in all fields.  Abdominal: Soft. She exhibits no distension. There is no tenderness.  Musculoskeletal: She exhibits no edema or tenderness.  Neurological: She is alert and oriented to person, place, and time.  Skin: Skin is warm and dry. She is not diaphoretic.  Psychiatric: She has a normal mood and affect. Her behavior is normal.  Nursing note and vitals reviewed.  ED Course  Procedures (including critical care time)  DIAGNOSTIC STUDIES: Oxygen Saturation is 97% on RA, normal by my interpretation.   COORDINATION OF CARE: 6:27 PM-Discussed next steps with pt including UA and CXR. Pt verbalized understanding and is agreeable with the plan.    Labs Review Labs Reviewed  CBC WITH DIFFERENTIAL/PLATELET - Abnormal; Notable for the following:    WBC 12.1 (*)    Platelets 447 (*)    Neutro Abs 9.3 (*)    Monocytes Absolute 1.4 (*)    All other components within normal limits  BASIC METABOLIC PANEL - Abnormal; Notable for the following:    Sodium 129 (*)    Chloride 92 (*)    Glucose, Bld 186 (*)    BUN 24 (*)    GFR calc non Af Amer 52 (*)    All other components within normal limits  URINALYSIS, ROUTINE W REFLEX  MICROSCOPIC (NOT AT [facility]) - Abnormal; Notable for the following:  Protein, ur 100 (*)    Leukocytes, UA SMALL (*)    All other components within normal limits  URINE MICROSCOPIC-ADD ON - Abnormal; Notable for the following:    Squamous Epithelial / LPF 0-5 (*)    Bacteria, UA FEW (*)    All other components within normal limits    Imaging Review Dg Chest 2 View  10/31/2015  CLINICAL DATA:  Cough and fatigue EXAM: CHEST  2 VIEW COMPARISON:  10/27/2015 FINDINGS: Cardiac shadow is stable. Patchy scarring is again identified bilaterally similar to that seen on prior examinations. No new focal infiltrate or sizable effusion is seen. Aortic calcifications are again noted and stable. No bony abnormality is noted. IMPRESSION: Patchy scarring bilaterally stable from the prior exam. Aortic atherosclerosis. Electronically Signed   By: Inez Catalina M.D.   On: 10/31/2015 19:27    I have personally reviewed and evaluated these images and lab results as part of my medical decision-making.   EKG Interpretation None      MDM   Final diagnoses:  Hyponatremia    82 yo F with multiple complaints, sent from PCP.  Patient thinks for hyponatremia and hypokalemia.  Recently treated for pna.  With that collection of symptoms I was concerning for SIADH vs legionaires disease.  Not hypokalemic on labs, mild hyponatremia.  Feel no need for admission.  Multiple complaints with no noted acute symptoms(all have been going on for "years").  See no reason for admission, with hyponatremia and hypochloridemia given NS bolus.  PCP follow up.  I have discussed the diagnosis/risks/treatment options with the patient and family and believe the pt to be eligible for discharge home to follow-up with PCP2. We also discussed returning to the ED immediately if new or worsening sx occur. We discussed the sx which are most concerning (e.g., sudden worsening pain, fever, inability to tolerate by mouth ) that necessitate immediate  return. Medications administered to the patient during their visit and any new prescriptions provided to the patient are listed below.  Medications given during this visit Medications  sodium chloride 0.9 % bolus 1,000 mL (0 mLs Intravenous Stopped 10/31/15 2027)    Discharge Medication List as of 10/31/2015  8:31 PM      The patient appears reasonably screen and/or stabilized for discharge and I doubt any other medical condition or other [facility] requiring further screening, evaluation, or treatment in the ED at this time prior to discharge.   I personally performed the services described in this documentation, which was scribed in my presence. The recorded information has been reviewed and is accurate.      Deno Etienne, DO 11/01/15 2011

## 2015-11-01 DIAGNOSIS — E119 Type 2 diabetes mellitus without complications: Secondary | ICD-10-CM | POA: Diagnosis not present

## 2015-11-01 DIAGNOSIS — H353134 Nonexudative age-related macular degeneration, bilateral, advanced atrophic with subfoveal involvement: Secondary | ICD-10-CM | POA: Diagnosis not present

## 2015-11-01 DIAGNOSIS — H5213 Myopia, bilateral: Secondary | ICD-10-CM | POA: Diagnosis not present

## 2015-11-01 DIAGNOSIS — H16223 Keratoconjunctivitis sicca, not specified as Sjogren's, bilateral: Secondary | ICD-10-CM | POA: Diagnosis not present

## 2015-11-01 DIAGNOSIS — H52223 Regular astigmatism, bilateral: Secondary | ICD-10-CM | POA: Diagnosis not present

## 2015-11-01 DIAGNOSIS — H53453 Other localized visual field defect, bilateral: Secondary | ICD-10-CM | POA: Diagnosis not present

## 2015-11-01 DIAGNOSIS — H524 Presbyopia: Secondary | ICD-10-CM | POA: Diagnosis not present

## 2015-11-02 ENCOUNTER — Encounter: Payer: Self-pay | Admitting: Internal Medicine

## 2015-11-02 ENCOUNTER — Ambulatory Visit (INDEPENDENT_AMBULATORY_CARE_PROVIDER_SITE_OTHER): Payer: Medicare Other | Admitting: Internal Medicine

## 2015-11-02 ENCOUNTER — Telehealth: Payer: Self-pay | Admitting: Internal Medicine

## 2015-11-02 VITALS — BP 118/68 | HR 93 | Ht 68.0 in | Wt 147.8 lb

## 2015-11-02 DIAGNOSIS — A310 Pulmonary mycobacterial infection: Secondary | ICD-10-CM

## 2015-11-02 DIAGNOSIS — J471 Bronchiectasis with (acute) exacerbation: Secondary | ICD-10-CM | POA: Diagnosis not present

## 2015-11-02 DIAGNOSIS — A319 Mycobacterial infection, unspecified: Secondary | ICD-10-CM

## 2015-11-02 DIAGNOSIS — I701 Atherosclerosis of renal artery: Secondary | ICD-10-CM

## 2015-11-02 NOTE — Progress Notes (Signed)
04/09/11- 85 yoF never smoker, followed for bronchiectasis/ chronic bronchitis, complicated by hx of GERD, DM. LOV-May 15, 2010 Has had flu vax. We called in a Zpak for bronchitis in August - she says it helped. She complains of weakness "tired" over the past several weeks- nothing specific- but has difficulty walking around home. Was having left neck pain. Dr Ernesto Rutherford ENT gave antibiotic. Dr Lawernce Pitts gave prednisone taper, xray'd neck. She says prednisone helped her neck pain. Tried a heating pad but burned herself. Cough also improved after the prednisone.  Denies fever, but has had some sweat. Weight up, then down. No chest pain and little residual dry cough.  09/30/11- 85 yoF never smoker, followed for bronchiectasis/ chronic bronchitis, complicated by hx of GERD, DM  Patient still c/o cough with clear mucus and sob. She complains of persistent weakness such that it can be hard to get out of bed in the morning. Legs give out. She doesn't describe claudication or exertional chest pain. Weakness is more important than shortness of breath. Scant clear mucus.  CXR 04/17/11 reviewed with her. IMPRESSION:  Stable patchy parenchymal opacities as before. No acute or  superimposed abnormality.  Original Report Authenticated By: Trecia Rogers, M.D.    11/14/11- 82 yoF never smoker, followed for bronchiectasis/ chronic bronchitis, complicated by hx of GERD, DM Still prod cough w/ clear mucus, feels the biaxin did help.  Was given medrol dose pak by Dr Trudie Reed She started Medrol today. She is not focused on her breathing at this visit, being actively uncomfortable with low back and bilateral hip pains. Got a steroid injection yesterday and has tramadol but resists taking pain medications.  01/16/12- 85 yoF never smoker, followed for bronchiectasis/ chronic bronchitis, complicated by hx of GERD, DM Cough-productive-clear in color; wheezing, feels as though not moving enough air. Had flu  vaccine Increased cough with clear mucus. Sometimes back pain catches her breath. Pro air is not enough. Taking one half tramadol for cough or pain but it can make her queasy. Does have some Tussionex at home. Occasionally uses her nebulizer with ipratropium. She has been very sensitive to stimulants. Watery nose-prefers Kleenex over medication. Back pain drags her down- going to orthopedist.  05/28/12- 85 yoF never smoker, followed for bronchiectasis/ chronic bronchitis, complicated by hx of GERD, DM FOLLOWS FOR: unable to catch breath at times; wheezing, SOB, and cough--productive at times-clear in color. Ortho injected hip- limits activity less than when last here.Had dental extraction and on "penicillin" for UTI. Using neb atrovent. Clear sputum, chronic cough. Uses Proair but makes her shake. Considering cosmetic surgery.  06/09/12- 85 yoF never smoker, followed for bronchiectasis/ chronic bronchitis, complicated by hx of GERD, DM FOLLOWS FOR: Per CY-review CT results in person with patient She returns, with her granddaughter, at my request so that I can go over her CT scan. This shows significant fibrocavitary scarring and nodules which could be quite consistent with MAIC. She has a very long history of bronchiectasis which had been fairly stable in the left lower lobe for many years, but this is more extensive and bilateral. I do not get a history that she has been aspirating. She denies night sweats and coughs up almost nothing. CT chest 06/01/12- IMPRESSION:  Progressive bilateral nodular bronchiectasis. Associated nodules  have increased in size including mass-like nodule within the right  lower lobe measuring up to 1.2 cm. It is possible these changes  represent findings of Mycobacterium avium intracellulare. Tumor  difficult to exclude.  Prominent atherosclerotic type changes as detailed above.  This is a call report.  Original Report Authenticated By: Genia Del, M.D.  07/14/12-   85 yoF never smoker, followed for bronchiectasis/ chronic bronchitis/ MAIC, complicated by hx of GERD, DM   Start 07/14/12- Biaxin 500mg  TIW, EMB 1200 TIW, Rifabutin 300 TIW FOLLOWS FOR: review labs (sputum culture) in greater detail with patient; still having cough-productive-clear in color,; SOB and wheezing-worsened by activity. Chronic cough persists, scant clear mucus. Sometimes feels hot or cold with some night sweating. No blood, fever or swollen glands. Chronic back pain comes and goes. Sputum Cx 06/09/12 POS MAIC- discussed in detail with her, including discussion of medications. She has tried tramadol and Perles for cough.  08/31/12- 85 yoF never smoker, followed for bronchiectasis/ chronic bronchitis/ MAIC, complicated by hx of GERD, DM   Started 07/14/12- Biaxin 500mg  TIW, EMB 1200 TIW, Rifabutin 300 TIW  ended- 08/12/12 due to weakness and malaise.  FOLLOWS FOR: pt was put on abx's at last visit-pt states they made her sick. 2 hospital stays since 07-14-12 visit.  She says she is "sure" the triple therapy was helping her chest, but it made her too weak. She was hospitalized April 9 through April 12 because of weakness and then from April 14 - April 16 with urinary infection and diarrhea-negative for C. Difficile. She now has her persistent baseline cough, mostly dry with scant clear sputum. She denies sweats or fever but stays chilly. She mentions her poor teeth and is considering dental implants-teeth affect her chewing/nutrition. CXR 07/29/12 IMPRESSION:  1. No acute cardiopulmonary process  2. No significant interval change in the appearance of known  varicose bronchiectasis and scarring within the lingula and the  dependent aspects of the upper and lower lobes bilaterally.  Original Report Authenticated By: Jacqulynn Cadet, M.D.  09/25/12- 82 yoF never smoker, followed for bronchiectasis/ chronic bronchitis/ MAIC, complicated by hx of GERD, DM   Started 07/14/12- Biaxin 500mg  TIW, EMB 1200  TIW, Rifabutin 300 TIW  ended- 08/12/12 due to weakness and malaise FOLLOWS FOR: still continues to have cough-productive-clear in color; worse in the morning but lasts through the day  10/07/12- 85 yoF never smoker, followed for bronchiectasis/ chronic bronchitis/ MAIC, complicated by hx of GERD, DM   Started 07/14/12- Biaxin 500mg  TIW, EMB 1200 TIW, Rifabutin 300 TIW  ended- 08/12/12 due to weakness and malaise    Has grandson with her today. FOLLOWS FOR: continues to have cough-same as last OV-productive-clear in color; needs RX for Tussionex as she does not recall getting printed Rx at last OV. Also, would like  RX for Tramadol. Dr Gladstone Lighter treated prednisone taper for sore ankles. Cough pattern has not changed, reflecting her known bronchiectasis. Denies fever, blood, chest pain, purulent sputum.  01/18/13- 87 yoF never smoker, followed for bronchiectasis/ chronic bronchitis/ MAIC, complicated by hx of GERD, DM   Started 07/14/12- Biaxin 500mg  TIW, EMB 1200 TIW, Rifabutin 300 TIW  ended- 08/12/12 due to weakness and malaise  FOLLOWS FOR: surgery clearance for back surgery if cleared. Would like Proair HFA refill sent to [facility] on file. Waiting on availability of extra strength flu vaccine this fall. Pending low back surgery because of chronic pain. Denies change in her chronic cough with scant clear sputum. No chest pain, palpitation, fever or blood. Dyspnea on exertion but mobility is limited more by her back pain so she gets little exercise. Still trying to maintain her own home. Daughter coming up to stay for a  while. Office spirometry  01/18/13- moderate obstructive airways disease-FVC 2.10/69%, FEV1 1.25/58%,  FEV1/FVC  0.59/ 84%,  FEF 25-75% 0.47/33%  09/08/13- 68 yoF never smoker, followed for bronchiectasis/ chronic bronchitis/ MAIC, complicated by hx of GERD, DM   (Started 07/14/12- Biaxin 500mg  TIW, EMB 1200 TIW, Rifabutin 300 TIW  ended- 08/12/12 due to weakness and malaise) ACUTE  VISIT:weakness, SOB, and cough-non productive Seeing a chiropractor for back pain after L.-5 surgery. Dry cough. She had clean iron lawn furniture with a strong cleanser which increased her cough 3 days ago. Her primary physician today gave her vaccine Prevnar. She complains of feeling weak and asking for "a shot to make me feel better". We discussed steroids and her recent surgery/ osteoporosis. CXR 01/18/13 Heart size and pulmonary vascularity are normal. There are chronic  inflammatory changes in the right upper lung zone and in the left  mid and lower lung zones, essentially unchanged since the prior  studies consistent with the areas of bronchiectasis and chronic  inflammation noted on the prior chest x-rays and CT scan. There is  an area of chronic atelectasis and bronchiectasis in the lingula.  No acute infiltrates or effusions. No acute osseous abnormality.  IMPRESSION:  No acute abnormalities. Chronic changes in both lungs as described.  Electronically Signed  By: Rozetta Nunnery  On: 01/18/2013 17:38  11/08/13-  87 yoF never smoker, followed for bronchiectasis/ chronic bronchitis/ MAIC, complicated by hx of GERD, DM, arthritis   (Started 07/14/12- Biaxin 500mg  TIW, EMB 1200 TIW, Rifabutin 300 TIW FOLLOWS FOR:  Still having sob, weakness and cough non-productive states no better since last ov Cough chronic, clear mucus. Cough is tiring and hurts back. Dislikes tramadol. Perles no help, tussionnex too strong.   05/17/14- 87 yoF never smoker, followed for bronchiectasis/ chronic bronchitis/ MAIC, complicated by hx of GERD, DM, arthritis   (Started 07/14/12- Biaxin 500mg  TIW, EMB 1200 TIW, Rifabutin 300 TIW- stopped 2014 due to malaise. FOLLOW FOR:  Bronchiectasis; coughing up clear mucus; SOB; DOE Breathing/dyspnea/cough or stable but cough remains. Asks an inhaled medicine to help her cough out mucus-discussed. Delivery is delayed for her mail order nebulizer medications and we discussed trial  of Xopenex again. She has been extremely sensitive to stimulant medications.  11/15/14- 87 yoF never smoker, followed for bronchiectasis/ chronic bronchitis/ MAIC, complicated by hx of GERD, DM, arthritis, hyponatremia( SIADH?)   (Started 07/14/12- Biaxin 500mg  TIW, EMB 1200 TIW, Rifabutin 300 TIW- stopped 2014 due to malaise. FOLLOWS FOR: Went to [facility] sunday-was told today by them that her sodium level is low; pt remains weak for about 3 weeks now and was told to follow up with CY. Pt states she has gotten weaker since Sunday. SOB as well. No energy. She hasn't felt well for a long time-weakness and joint pains. Has had episodes of hyponatremia. She is now drinking Gatorade once daily and was taken off her diuretic. She will be seeing Dr. Rex Kras soon, but asked we go ahead and check her sodium level today. Not much cough or phlegm. Tussive tenderness in the last day or 2 left mid axillary rib. We reviewed chest x-ray image CXR- 05/17/14 IMPRESSION: 1. Spectrum of findings similar to numerous prior examinations, as above, suggestive of underlying MAI. 2. Atherosclerosis. Electronically Signed  By: Vinnie Langton M.D.  On: 05/17/2014 15:56  03/20/2015-82 year old female never smoker followed for bronchiectasis/chronic bronchitis/MAIC, complicated by history of GERD, DM, arthritis (Started 07/14/12- Biaxin 500mg  TIW, EMB 1200 TIW, Rifabutin 300 TIW- stopped 2014  due to malaise. FOLLOWS FOR: No change in cough and SOB since last OV - breathing is the same.  11/02/2015-82 year old female never smoker followed for bronchiectasis/chronic bronchitis/MAIC, complicated by history GERD, DM, arthritis Did not tolerate triple therapy for [facility] 2014 FOLLOWS FOR: Pt states she was not able to tolerate abx's given by ID . Pt is feelings sick all the time-currently on Levaquin(givenby PCP office)and has 1 day left. She had tried [facility] therapy again -see phone note 09/21/2015 pharmacist. Most recently was  trying Levaquin/zith then added rifabutin per Dr.Comer. ED 7/11 for hyponatremia Eyesight is declining quickly-macular degeneration CXR 10/31/2015-bilateral patchy scarring-stable, aortic atherosclerosis  ROS-see HPI Constitutional:     No-weight loss, night sweats, no-fevers, chills,  +fatigue, lassitude. HEENT:   No-  headaches, difficulty swallowing, +tooth/dental problems, no-sore throat,       No-  sneezing, itching, ear ache, nasal congestion, post nasal drip,  CV:  No-   chest pain, orthopnea, PND, swelling in lower extremities, anasarca,  dizziness, palpitations Resp: +Mild  shortness of breath with exertion or at rest.             +productive cough,  + non-productive cough,  No- coughing up of blood.              No-   change in color of mucus.  No- wheezing.   Skin: No-   rash or lesions. GI:  No-   heartburn, indigestion, abdominal pain, nausea, vomiting,  GU:  MS:  +Active low back and bilateral hip pain Neuro-     complaint of generalized weakness without myalgias Psych:  No- change in mood or affect. No depression or anxiety.  No memory loss.  General- Alert, Oriented, Affect-appropriate, Distress- none acute. Looks well/neatly groomed. Skin- rash-none,  excoriation- none Lymphadenopathy- none Head- atraumatic            Eyes- Gross vision intact, PERRLA, conjunctivae clear secretions            Ears- Hearing, canals-normal            Nose- Clear, no-Septal dev, mucus, polyps, erosion, perforation             Throat- Mallampati II , mucosa very dry , drainage- none, tonsils- atrophic Neck- flexible , trachea midline, no stridor , thyroid nl, carotid no bruit Chest - symmetrical excursion , unlabored           Heart/CV- RRR , no murmur , no gallop  , no rub, nl s1 s2                           - JVD- none , edema- none, stasis changes- none, varices- none           Lung-   Cough + deep, + faint basilar crackles L>R  , unlabored, no wheeze , dullness-none, rub-none.  Chest  wall-  Abd-  Br/ Gen/ Rectal- Not done, not indicated Extrem-   No-clubbing, none, atrophy- none, strength- nl for age. + Cool/dusky feet Neuro- grossly intact to observation

## 2015-11-02 NOTE — Telephone Encounter (Signed)
Spoke with the pt  She states that she was advised by [facility] to find her flutter valve and start using it  She could not find hers I advised will need to come and pick this up, but will have to be shown how to use and sign for it  She verbalized understanding  I left one up front for pick up with note to ask for triage when she comes in

## 2015-11-02 NOTE — Patient Instructions (Signed)
Suggest you find the green plastic Flutter device    Blow through 4 times per set, three sets per day, when needed to help clear mucus from your airways.  Use your heating pad when your back acts up.  Keep the appointment to see Dr Linus Salmons, the Infectious Disease doctor, as scheduled in August  Please call as needed

## 2015-11-03 MED ORDER — FLUTTER DEVI: Status: DC

## 2015-11-04 NOTE — Assessment & Plan Note (Signed)
Bronchiectasis known for many years and slowly progressive. Only scarring noted on most recent chest x-ray. She has not tolerated triple therapy for [facility] but has been told that to drugs would be insufficient. Plan-I reminded her and encouraged her to start using her Flutter device again to help with pulmonary toilet

## 2015-11-04 NOTE — Assessment & Plan Note (Signed)
Difficult prognosis. She does not tolerate the available therapies. Appreciate support of Infectious Disease

## 2015-11-09 DIAGNOSIS — E871 Hypo-osmolality and hyponatremia: Secondary | ICD-10-CM | POA: Diagnosis not present

## 2015-11-09 DIAGNOSIS — I10 Essential (primary) hypertension: Secondary | ICD-10-CM | POA: Diagnosis not present

## 2015-11-09 DIAGNOSIS — M109 Gout, unspecified: Secondary | ICD-10-CM | POA: Diagnosis not present

## 2015-12-04 DIAGNOSIS — H353114 Nonexudative age-related macular degeneration, right eye, advanced atrophic with subfoveal involvement: Secondary | ICD-10-CM | POA: Diagnosis not present

## 2015-12-04 DIAGNOSIS — H35423 Microcystoid degeneration of retina, bilateral: Secondary | ICD-10-CM | POA: Diagnosis not present

## 2015-12-04 DIAGNOSIS — H43813 Vitreous degeneration, bilateral: Secondary | ICD-10-CM | POA: Diagnosis not present

## 2015-12-04 DIAGNOSIS — H353223 Exudative age-related macular degeneration, left eye, with inactive scar: Secondary | ICD-10-CM | POA: Diagnosis not present

## 2015-12-05 ENCOUNTER — Ambulatory Visit: Payer: Medicare Other | Admitting: Internal Medicine

## 2015-12-07 DIAGNOSIS — L299 Pruritus, unspecified: Secondary | ICD-10-CM | POA: Diagnosis not present

## 2015-12-07 DIAGNOSIS — I10 Essential (primary) hypertension: Secondary | ICD-10-CM | POA: Diagnosis not present

## 2015-12-23 DIAGNOSIS — K1120 Sialoadenitis, unspecified: Secondary | ICD-10-CM | POA: Diagnosis not present

## 2015-12-26 ENCOUNTER — Ambulatory Visit: Payer: Medicare Other | Admitting: Internal Medicine

## 2016-01-09 ENCOUNTER — Encounter: Payer: Self-pay | Admitting: Allergy and Immunology

## 2016-01-09 ENCOUNTER — Ambulatory Visit (INDEPENDENT_AMBULATORY_CARE_PROVIDER_SITE_OTHER): Payer: Medicare Other | Admitting: Allergy and Immunology

## 2016-01-09 DIAGNOSIS — L299 Pruritus, unspecified: Secondary | ICD-10-CM

## 2016-01-09 DIAGNOSIS — J310 Chronic rhinitis: Secondary | ICD-10-CM | POA: Diagnosis not present

## 2016-01-09 DIAGNOSIS — I701 Atherosclerosis of renal artery: Secondary | ICD-10-CM

## 2016-01-09 NOTE — Patient Instructions (Signed)
Pruritus Unclear etiology.  The patient's history suggests the possibility of adverse reaction secondary to a prescription medication, specifically angiotensin receptor blockers. Food allergen and environmental allergen skin tests were negative today despite a positive histamine control.  If possible, hold telmisartan for least 6-8 weeks and assess for symptom reduction.  If symptoms persist or progress despite this medication change, we will proceed with laboratory investigation for other possible etiologies.   Return if symptoms worsen or fail to improve.

## 2016-01-09 NOTE — Assessment & Plan Note (Addendum)
Unclear etiology.  The patient's history suggests the possibility of adverse reaction secondary to a prescription medication, specifically angiotensin receptor blockers. Food allergen and environmental allergen skin tests were negative today despite a positive histamine control.  If possible, hold telmisartan for least 6-8 weeks and assess for symptom reduction.  If symptoms persist or progress despite this medication change, we will proceed with laboratory investigation for other possible etiologies.

## 2016-01-09 NOTE — Progress Notes (Signed)
New Patient Note  RE: Katie Alvarado MRN: [id number] DOB: 07-28-1925 Date of Office Visit: 01/09/2016  Referring provider: Hulan Fess, MD Primary care provider: Gennette Pac, MD  Chief Complaint: Pruritis   History of present illness: Katie Alvarado is a 82 y.o. female presenting today for consultation of pruritus.  She reports that she has been experiencing generalized pruritus over the past 3 months.  The pruritus tends to be most intense over her arms and chest.  She believes that the pruritus was initially triggered by losartan.  She reports that his losartan was discontinued and instead she was started on telmisartan and amlodipine, however the pruritus has persisted.  She denies the presence of an overt rash.  She does not experience concomitant cardiopulmonary or GI symptoms.  Other than her suspicion that her blood pressure medication may be triggering the pruritus, no other specific environmental or food triggers have been identified.   Assessment and plan: Pruritus Unclear etiology.  The patient's history suggests the possibility of adverse reaction secondary to a prescription medication, specifically angiotensin receptor blockers. Food allergen and environmental allergen skin tests were negative today despite a positive histamine control.  If possible, hold telmisartan for least 6-8 weeks and assess for symptom reduction.  If symptoms persist or progress despite this medication change, we will proceed with laboratory investigation for other possible etiologies.  Diagnositics: Environmental skin testing:  Negative despite a positive histamine control. Food allergen skin testing:  Negative despite a positive histamine control.    Physical examination: Blood pressure 130/70, pulse 68, temperature 98 F (36.7 C), temperature source Oral, resp. rate 16.  General: Alert, interactive, in no acute distress. Neck: Supple without lymphadenopathy. Lungs: Mildly decreased  breath sounds bilaterally without wheezing, rhonchi or rales. CV: Normal S1, S2 without murmurs. Abdomen: Nondistended, nontender. Skin: Warm and dry, without lesions or rashes. Extremities:  No clubbing, cyanosis or edema. Neuro:   Grossly intact.  Review of systems:  Review of systems negative except as noted in HPI / PMHx or noted below: Review of Systems  Constitutional: Negative.   HENT: Negative.   Eyes: Negative.   Respiratory: Negative.   Cardiovascular: Negative.   Gastrointestinal: Negative.   Genitourinary: Negative.   Musculoskeletal: Negative.   Skin: Negative.   Neurological: Negative.   Endo/Heme/Allergies: Negative.   Psychiatric/Behavioral: Negative.     Past medical history:  Past Medical History:  Diagnosis Date  . Acute nasopharyngitis (common cold)   . Arthritis   . Bronchiectasis with acute exacerbation ([place])   . Cancer ([employer])    skin cancer  . COPD (chronic obstructive pulmonary disease) ([place])    sees dr. Annamaria Boots   . Cough   . Diabetes mellitus    fasting 130-150  . Dizziness   . Esophageal reflux   . Hypertension   . Insomnia, unspecified   . Other symptoms involving nervous and musculoskeletal systems(781.99)   . Pneumonia    hx of  . Shortness of breath    exertion    Past surgical history:  Past Surgical History:  Procedure Laterality Date  . ABDOMINAL HYSTERECTOMY    . ANKLE FRACTURE SURGERY Left   . APPENDECTOMY    . BASAL CELL CARCINOMA EXCISION     NOSE  . CATARACT EXTRACTION, BILATERAL     OUT PATIENT  . COLON SURGERY     colon resection for diverticulitis  . LUMBAR LAMINECTOMY/DECOMPRESSION MICRODISCECTOMY N/A 02/15/2013   Procedure: LUMBAR LAMINECTOMY/DECOMPRESSION MICRODISCECTOMY LUMBAR FOUR-FIVE;  Surgeon: Jeneen Rinks  Peter Garter, MD;  Location: [facility];  Service: Neurosurgery;  Laterality: N/A;  . VARICOSE VEIN SURGERY      Family history: Family History  Problem Relation Age of Onset  . Chronic bronchitis Father   .  Other Sister     heart trouble  . Other Brother     heart trouble    Social history: Social History   Social History  . Marital status: Widowed    Spouse name: N/A  . Number of children: 2  . Years of education: N/A   Occupational History  . Not on file.   Social History Main Topics  . Smoking status: Never Smoker  . Smokeless tobacco: Never Used  . Alcohol use Yes     Comment: "Hardly Ever"   . Drug use: No  . Sexual activity: Not Currently    Birth control/ protection: Post-menopausal   Other Topics Concern  . Not on file   Social History Narrative  . No narrative on file   Environmental History: The patient lives in a 80 year old house with carpeting throughout and central air/heat.  She is a nonsmoker without pets.    Medication List       Accurate as of 01/09/16  5:23 PM. Always use your most recent med list.          acetaminophen 325 MG tablet Commonly known as:  TYLENOL Take 2 tablets (650 mg total) by mouth every 6 (six) hours as needed for pain.   allopurinol 100 MG tablet Commonly known as:  ZYLOPRIM Take 1 tablet by mouth daily.   ALPRAZolam 0.25 MG tablet Commonly known as:  XANAX Take 0.25 mg by mouth 2 (two) times daily as needed for sleep or anxiety. For anxiety.   amLODipine 2.5 MG tablet Commonly known as:  NORVASC Take 2.5 mg by mouth daily.   aspirin EC 81 MG tablet Take 81 mg by mouth every morning.   benzonatate 100 MG capsule Commonly known as:  TESSALON TAKE ONE CAPSULE BY MOUTH 3 TIMES A DAY AS NEEDED FOR COUGH   budesonide 0.25 MG/2ML nebulizer solution Commonly known as:  PULMICORT Take 2 mLs (0.25 mg total) by nebulization daily.   cholecalciferol 1000 units tablet Commonly known as:  VITAMIN D Take 2,000 Units by mouth every morning.   Cinnamon 500 MG capsule Take 500 mg by mouth every morning.   DRY EYES OP Apply 1 drop to eye 2 (two) times daily as needed (for dry eyes).   esomeprazole 40 MG  capsule Commonly known as:  NEXIUM Take 40 mg by mouth every morning.   Fish Oil 1000 MG Caps Take 1 capsule by mouth daily.   FLUTTER Devi Use as directed   ibuprofen 200 MG tablet Commonly known as:  ADVIL,MOTRIN Take 200-400 mg by mouth every 6 (six) hours as needed for headache or moderate pain.   ipratropium 0.02 % nebulizer solution Commonly known as:  ATROVENT Take 2.5 mLs (500 mcg total) by nebulization 4 (four) times daily.   losartan 50 MG tablet Commonly known as:  COZAAR Take 50 mg by mouth daily.   meclizine 25 MG tablet Commonly known as:  ANTIVERT Take 25 mg by mouth every 6 (six) hours as needed for dizziness.   metFORMIN 500 MG tablet Commonly known as:  GLUCOPHAGE Take 750 mg by mouth 2 (two) times daily.   ondansetron 4 MG tablet Commonly known as:  ZOFRAN Take 1 tablet (4 mg total) by mouth every  Monday, Wednesday, and Friday. With breakfast   ONE TOUCH ULTRA TEST test strip Generic drug:  glucose blood USE TO CHECK BLOOD SUGAR ONCE DAILY 90   ONETOUCH DELICA LANCETS 99991111 Misc USE TO CHECK BLOOD SUGAR ONCE DAILY AS DIRECTED   PROAIR HFA 108 (90 Base) MCG/ACT inhaler Generic drug:  albuterol INHALE 2 PUFFS INTO THE LUNGS EVERY 6 HOURS AS NEEDED FOR WHEEZING OR SHORTNESS OF BREATH   rifabutin 150 MG capsule Commonly known as:  MYCOBUTIN Take 2 capsules (300 mg total) by mouth every Monday, Wednesday, and Friday. With dinner   telmisartan 40 MG tablet Commonly known as:  MICARDIS Take 40 mg by mouth daily.   traMADol 50 MG tablet Commonly known as:  ULTRAM Take 50 mg by mouth every 6 (six) hours as needed for moderate pain.   triamterene-hydrochlorothiazide 75-50 MG tablet Commonly known as:  MAXZIDE Take 0.5 tablets by mouth every other day.   vitamin E 400 UNIT capsule Take 400 Units by mouth daily.       Known medication allergies: Allergies  Allergen Reactions  . Welchol [Colesevelam Hcl] Hives  . Glucosamine Other (See  Comments)    Unknown  . Tramadol Nausea Only  . Celecoxib Hives, Itching and Rash  . Statins Rash    I appreciate the opportunity to take part in Katie Alvarado's care. Please do not hesitate to contact me with questions.  Sincerely,   R. Edgar Frisk, MD

## 2016-01-12 DIAGNOSIS — L299 Pruritus, unspecified: Secondary | ICD-10-CM | POA: Diagnosis not present

## 2016-01-12 DIAGNOSIS — Z23 Encounter for immunization: Secondary | ICD-10-CM | POA: Diagnosis not present

## 2016-01-23 ENCOUNTER — Ambulatory Visit: Payer: Medicare Other | Admitting: Allergy and Immunology

## 2016-01-24 DIAGNOSIS — E119 Type 2 diabetes mellitus without complications: Secondary | ICD-10-CM | POA: Diagnosis not present

## 2016-01-24 DIAGNOSIS — L299 Pruritus, unspecified: Secondary | ICD-10-CM | POA: Diagnosis not present

## 2016-01-24 DIAGNOSIS — Z7984 Long term (current) use of oral hypoglycemic drugs: Secondary | ICD-10-CM | POA: Diagnosis not present

## 2016-02-20 DIAGNOSIS — H43813 Vitreous degeneration, bilateral: Secondary | ICD-10-CM | POA: Diagnosis not present

## 2016-02-20 DIAGNOSIS — H353114 Nonexudative age-related macular degeneration, right eye, advanced atrophic with subfoveal involvement: Secondary | ICD-10-CM | POA: Diagnosis not present

## 2016-02-20 DIAGNOSIS — H35423 Microcystoid degeneration of retina, bilateral: Secondary | ICD-10-CM | POA: Diagnosis not present

## 2016-02-20 DIAGNOSIS — H33312 Horseshoe tear of retina without detachment, left eye: Secondary | ICD-10-CM | POA: Diagnosis not present

## 2016-02-20 DIAGNOSIS — H353223 Exudative age-related macular degeneration, left eye, with inactive scar: Secondary | ICD-10-CM | POA: Diagnosis not present

## 2016-02-27 DIAGNOSIS — L299 Pruritus, unspecified: Secondary | ICD-10-CM | POA: Diagnosis not present

## 2016-02-27 DIAGNOSIS — I10 Essential (primary) hypertension: Secondary | ICD-10-CM | POA: Diagnosis not present

## 2016-03-05 ENCOUNTER — Encounter: Payer: Self-pay | Admitting: Allergy and Immunology

## 2016-03-05 ENCOUNTER — Ambulatory Visit (INDEPENDENT_AMBULATORY_CARE_PROVIDER_SITE_OTHER): Payer: Medicare Other | Admitting: Allergy and Immunology

## 2016-03-05 DIAGNOSIS — I701 Atherosclerosis of renal artery: Secondary | ICD-10-CM | POA: Diagnosis not present

## 2016-03-05 DIAGNOSIS — L299 Pruritus, unspecified: Secondary | ICD-10-CM

## 2016-03-05 DIAGNOSIS — L500 Allergic urticaria: Secondary | ICD-10-CM

## 2016-03-05 DIAGNOSIS — K2970 Gastritis, unspecified, without bleeding: Secondary | ICD-10-CM | POA: Diagnosis not present

## 2016-03-05 MED ORDER — HYDROXYZINE HCL 10 MG/5ML PO SYRP: 25.0000 mg | ORAL_SOLUTION | Freq: Every day | ORAL | 5 refills | Status: DC | PRN

## 2016-03-05 MED ORDER — LEVOCETIRIZINE DIHYDROCHLORIDE 5 MG PO TABS: 5.0000 mg | ORAL_TABLET | Freq: Every day | ORAL | 5 refills | Status: DC | PRN

## 2016-03-05 NOTE — Progress Notes (Signed)
Follow-up Note  RE: Katie Alvarado MRN: [id number] DOB: 12/23/1925 Date of Office Visit: 03/05/2016  Primary care provider: Gennette Pac, MD Referring provider: Hulan Fess, MD  History of present illness: Katie Alvarado is a 82 y.o. female with generalized pruritus presenting today for follow up.  She was last seen in this clinic on 01/09/2016.  She reports that the generalized pruritus has persisted.  The pruritus is especially troublesome on her wrists, arms, and chest.  She states that she is miserable throughout the day and her sleep is disrupted at night due to the pruritus.  She is requesting medication to alleviate the symptoms.   Assessment and plan: Pruritus  The following labs have been ordered: FCeRI antibody, TSH, anti-thyroglobulin antibody, thyroid peroxidase antibody, tryptase, urea breath test, CBC, CMP, ESR, ANA, and galactose-alpha-1,3-galactose IgE level.  The patient will be called with further recommendations after lab results have returned.  A prescription has been provided for levocetirizine, 2.5 - 30m daily in the morning as needed.  A prescription has been provided for hydroxyzine 25 mg daily at bedtime as needed.  The patient has been warned of the sedating effects of hydroxyzine.   Meds ordered this encounter  Medications  . levocetirizine (XYZAL) 5 MG tablet    Sig: Take 1 tablet (5 mg total) by mouth daily as needed for allergies.    Dispense:  30 tablet    Refill:  5    2.5 mg  - 5 mg daily in the morning as needed  . hydrOXYzine (ATARAX) 10 MG/5ML syrup    Sig: Take 12.5 mLs (25 mg total) by mouth daily as needed. One teaspoonfull at night for itching    Dispense:  240 mL    Refill:  5    25 mg daily at bedtime as needed. The patient has been warned of the sedating effects of hydroxyzine.    Physical examination: Blood pressure 140/70, pulse 96, resp. rate 20.  General: Alert, interactive, in no acute distress. Neck: Supple without  lymphadenopathy. Lungs: Clear to auscultation without wheezing, rhonchi or rales. CV: Normal S1, S2 without murmurs. Skin: Warm and dry, without lesions or rashes.  The following portions of the patient's history were reviewed and updated as appropriate: allergies, current medications, past family history, past medical history, past social history, past surgical history and problem list.    Medication List       Accurate as of 03/05/16  1:44 PM. Always use your most recent med list.          acetaminophen 325 MG tablet Commonly known as:  TYLENOL Take 2 tablets (650 mg total) by mouth every 6 (six) hours as needed for pain.   allopurinol 100 MG tablet Commonly known as:  ZYLOPRIM Take 1 tablet by mouth daily.   ALPRAZolam 0.25 MG tablet Commonly known as:  XANAX Take 0.25 mg by mouth 2 (two) times daily as needed for sleep or anxiety. For anxiety.   amLODipine 2.5 MG tablet Commonly known as:  NORVASC Take 2.5 mg by mouth daily.   aspirin EC 81 MG tablet Take 81 mg by mouth every morning.   benzonatate 100 MG capsule Commonly known as:  TESSALON TAKE ONE CAPSULE BY MOUTH 3 TIMES A DAY AS NEEDED FOR COUGH   budesonide 0.25 MG/2ML nebulizer solution Commonly known as:  PULMICORT Take 2 mLs (0.25 mg total) by nebulization daily.   cholecalciferol 1000 units tablet Commonly known as:  VITAMIN D Take 2,000 Units  by mouth every morning.   Cinnamon 500 MG capsule Take 500 mg by mouth every morning.   DRY EYES OP Apply 1 drop to eye 2 (two) times daily as needed (for dry eyes).   esomeprazole 40 MG capsule Commonly known as:  NEXIUM Take 40 mg by mouth every morning.   Fish Oil 1000 MG Caps Take 1 capsule by mouth daily.   FLUTTER Devi Use as directed   hydrOXYzine 10 MG/5ML syrup Commonly known as:  ATARAX Take 12.5 mLs (25 mg total) by mouth daily as needed. One teaspoonfull at night for itching   ibuprofen 200 MG tablet Commonly known as:   ADVIL,MOTRIN Take 200-400 mg by mouth every 6 (six) hours as needed for headache or moderate pain.   ipratropium 0.02 % nebulizer solution Commonly known as:  ATROVENT Take 2.5 mLs (500 mcg total) by nebulization 4 (four) times daily.   levocetirizine 5 MG tablet Commonly known as:  XYZAL Take 1 tablet (5 mg total) by mouth daily as needed for allergies.   losartan 50 MG tablet Commonly known as:  COZAAR Take 50 mg by mouth daily.   meclizine 25 MG tablet Commonly known as:  ANTIVERT Take 25 mg by mouth every 6 (six) hours as needed for dizziness.   metFORMIN 500 MG tablet Commonly known as:  GLUCOPHAGE Take 750 mg by mouth 2 (two) times daily.   ondansetron 4 MG tablet Commonly known as:  ZOFRAN Take 1 tablet (4 mg total) by mouth every Monday, Wednesday, and Friday. With breakfast   ONE TOUCH ULTRA TEST test strip Generic drug:  glucose blood USE TO CHECK BLOOD SUGAR ONCE DAILY 90   ONETOUCH DELICA LANCETS 16X Misc USE TO CHECK BLOOD SUGAR ONCE DAILY AS DIRECTED   PROAIR HFA 108 (90 Base) MCG/ACT inhaler Generic drug:  albuterol INHALE 2 PUFFS INTO THE LUNGS EVERY 6 HOURS AS NEEDED FOR WHEEZING OR SHORTNESS OF BREATH   rifabutin 150 MG capsule Commonly known as:  MYCOBUTIN Take 2 capsules (300 mg total) by mouth every Monday, Wednesday, and Friday. With dinner   telmisartan 40 MG tablet Commonly known as:  MICARDIS Take 40 mg by mouth daily.   traMADol 50 MG tablet Commonly known as:  ULTRAM Take 50 mg by mouth every 6 (six) hours as needed for moderate pain.   triamterene-hydrochlorothiazide 75-50 MG tablet Commonly known as:  MAXZIDE Take 0.5 tablets by mouth every other day.   vitamin E 400 UNIT capsule Take 400 Units by mouth daily.       Allergies  Allergen Reactions  . Welchol [Colesevelam Hcl] Hives  . Glucosamine Other (See Comments)    Unknown  . Tramadol Nausea Only  . Celecoxib Hives, Itching and Rash  . Statins Rash    I appreciate  the opportunity to take part in [name]'s care. Please do not hesitate to contact me with questions.  Sincerely,   R. Edgar Frisk, MD

## 2016-03-05 NOTE — Patient Instructions (Addendum)
Pruritus  The following labs have been ordered: FCeRI antibody, TSH, anti-thyroglobulin antibody, thyroid peroxidase antibody, tryptase, urea breath test, CBC, CMP, ESR, ANA, and galactose-alpha-1,3-galactose IgE level.  The patient will be called with further recommendations after lab results have returned.  A prescription has been provided for levocetirizine, 2.5 - 103m daily in the morning as needed.  A prescription has been provided for hydroxyzine 25 mg daily at bedtime as needed.  The patient has been warned of the sedating effects of hydroxyzine.   When lab results have returned the patient will be called with further recommendations and follow up instructions.

## 2016-03-05 NOTE — Assessment & Plan Note (Signed)
   The following labs have been ordered: FCeRI antibody, TSH, anti-thyroglobulin antibody, thyroid peroxidase antibody, tryptase, urea breath test, CBC, CMP, ESR, ANA, and galactose-alpha-1,3-galactose IgE level.  The patient will be called with further recommendations after lab results have returned.  A prescription has been provided for levocetirizine, 2.5 - '5mg'$  daily in the morning as needed.  A prescription has been provided for hydroxyzine 25 mg daily at bedtime as needed.  The patient has been warned of the sedating effects of hydroxyzine.

## 2016-03-06 LAB — CBC WITH DIFFERENTIAL/PLATELET
Basophils Absolute: 66 cells/uL (ref 0–200)
Basophils Relative: 1 %
Eosinophils Absolute: 132 cells/uL (ref 15–500)
Eosinophils Relative: 2 %
HCT: 36.2 % (ref 35.0–45.0)
Hemoglobin: 12 g/dL (ref 11.7–15.5)
Lymphocytes Relative: 30 %
Lymphs Abs: 1980 cells/uL (ref 850–3900)
MCH: 28.5 pg (ref 27.0–33.0)
MCHC: 33.1 g/dL (ref 32.0–36.0)
MCV: 86 fL (ref 80.0–100.0)
MPV: 10.3 fL (ref 7.5–12.5)
Monocytes Absolute: 726 cells/uL (ref 200–950)
Monocytes Relative: 11 %
Neutro Abs: 3696 cells/uL (ref 1500–7800)
Neutrophils Relative %: 56 %
Platelets: 313 10*3/uL (ref 140–400)
RBC: 4.21 MIL/uL (ref 3.80–5.10)
RDW: 14.8 % (ref 11.0–15.0)
WBC: 6.6 10*3/uL (ref 3.8–10.8)

## 2016-03-06 LAB — THYROGLOBULIN LEVEL: Thyroglobulin: 1.5 ng/mL — ABNORMAL LOW

## 2016-03-06 LAB — COMPREHENSIVE METABOLIC PANEL
ALT: 11 U/L (ref 6–29)
AST: 17 U/L (ref 10–35)
Albumin: 4.1 g/dL (ref 3.6–5.1)
Alkaline Phosphatase: 46 U/L (ref 33–130)
BUN: 16 mg/dL (ref 7–25)
CO2: 27 mmol/L (ref 20–31)
Calcium: 9.1 mg/dL (ref 8.6–10.4)
Chloride: 91 mmol/L — ABNORMAL LOW (ref 98–110)
Creat: 0.88 mg/dL (ref 0.60–0.88)
Glucose, Bld: 192 mg/dL — ABNORMAL HIGH (ref 65–99)
Potassium: 4.5 mmol/L (ref 3.5–5.3)
Sodium: 129 mmol/L — ABNORMAL LOW (ref 135–146)
Total Bilirubin: 0.5 mg/dL (ref 0.2–1.2)
Total Protein: 6.6 g/dL (ref 6.1–8.1)

## 2016-03-06 LAB — SEDIMENTATION RATE: Sed Rate: 12 mm/hr (ref 0–30)

## 2016-03-06 LAB — ANA: Anti Nuclear Antibody(ANA): NEGATIVE

## 2016-03-06 LAB — H. PYLORI BREATH TEST: H. pylori Breath Test: NOT DETECTED

## 2016-03-07 DIAGNOSIS — I10 Essential (primary) hypertension: Secondary | ICD-10-CM | POA: Diagnosis not present

## 2016-03-08 LAB — GALACTOSE-ALPHA-1,3-GALACTOSE IGE: Galactose-alpha-1,3-galactose IgE: <0.1 kU/L (ref <0.35)

## 2016-03-08 LAB — TRYPTASE: Tryptase: 3.4 ug/L (ref <11)

## 2016-03-13 LAB — CP CHRONIC URTICARIA INDEX PANEL
Histamine Release: <16 % (ref <16)
TSH: 2.09 mIU/L
Thyroglobulin Ab: 5 IU/mL — ABNORMAL HIGH (ref <2)
Thyroperoxidase Ab SerPl-aCnc: <1 IU/mL (ref <9)

## 2016-03-18 ENCOUNTER — Ambulatory Visit (INDEPENDENT_AMBULATORY_CARE_PROVIDER_SITE_OTHER): Payer: Medicare Other | Admitting: Internal Medicine

## 2016-03-18 ENCOUNTER — Encounter: Payer: Self-pay | Admitting: Internal Medicine

## 2016-03-18 DIAGNOSIS — J479 Bronchiectasis, uncomplicated: Secondary | ICD-10-CM

## 2016-03-18 DIAGNOSIS — I701 Atherosclerosis of renal artery: Secondary | ICD-10-CM | POA: Diagnosis not present

## 2016-03-18 DIAGNOSIS — L299 Pruritus, unspecified: Secondary | ICD-10-CM | POA: Diagnosis not present

## 2016-03-18 NOTE — Progress Notes (Signed)
HPI F never smoker, followed for bronchiectasis/ chronic bronchitis/ [facility], complicated by hx of GERD, DM  Sputum Cx 06/09/12 POS [facility]  Started 07/14/12- Biaxin 500mg  TIW, EMB 1200 TIW, Rifabutin 300 TIW  ended- 08/12/12 due to weakness and malaise,  CT chest 06/01/12- Progressive bilateral nodular bronchiectasis Office spirometry  01/18/13- moderate obstructive airways disease-FVC 2.10/69%, FEV1 1.25/58%,  FEV1/FVC  0.59/ 84%,  FEF 25-75% 0.47/33%   03/20/2015-82 year old female never smoker followed for bronchiectasis/chronic bronchitis/[facility], complicated by history of GERD, DM, arthritis (Started 07/14/12- Biaxin 500mg  TIW, EMB 1200 TIW, Rifabutin 300 TIW- stopped 2014 due to malaise. FOLLOWS FOR: No change in cough and SOB since last OV - breathing is the same.  11/02/2015-82 year old female never smoker followed for bronchiectasis/chronic bronchitis/[facility], complicated by history GERD, DM, arthritis,hyponatremia, macular degeneration Did not tolerate triple therapy for [facility] 2014 FOLLOWS FOR: Pt states she was not able to tolerate abx's given by ID . Pt is feelings sick all the time-currently on Levaquin(givenby PCP office)and has 1 day left. She had tried [facility] therapy again -see phone note 09/21/2015 pharmacist. Most recently was trying Levaquin/zith then added rifabutin per Dr.Comer. ED 7/11 for hyponatremia Eyesight is declining quickly-macular degeneration CXR 10/31/2015-bilateral patchy scarring-stable, aortic atherosclerosis  03/18/2016-82 year old female never smoker followed for bronchiectasis/chronic bronchitis/[facility], complicated by history GERD, DM, arthritis,hyponatremia  Being treated by Dr. Tilden Dome for pruritus with xyzal and hydroxyzine FOLLOWS FOR: Pt states she is doing okay-unable to do much anymore; mainly having issues with her vision so unable to do much. Denies any wheezing or SOB. Couldn't tolerate levaquin/ zith/ rifabutin from ID due to weakness.  Breathing has  been better with less cough on these antihistamines, but they don't help the itching. She blames telmisartan and amlodipine, but not Maxzide. No rash. Itching has waxed and waned over 4 months, never gone for long. Mainly confined to a full R surface of right arm and across anterior chest.  ROS-see HPI Constitutional:     No-weight loss, night sweats, no-fevers, chills,  +fatigue, lassitude. HEENT:   No-  headaches, difficulty swallowing, +tooth/dental problems, no-sore throat,       No-  sneezing, itching, ear ache, nasal congestion, post nasal drip,  CV:  No-   chest pain, orthopnea, PND, swelling in lower extremities, anasarca,  dizziness, palpitations Resp: +Mild  shortness of breath with exertion or at rest.             +productive cough,  + non-productive cough,  No- coughing up of blood.              No-   change in color of mucus.  No- wheezing.   Skin: No-   rash or lesions. GI:  No-   heartburn, indigestion, abdominal pain, nausea, vomiting,  GU:  MS:  +Active low back and bilateral hip pain Neuro-     complaint of generalized weakness without myalgias Psych:  No- change in mood or affect. No depression or anxiety.  No memory loss.  General- Alert, Oriented, Affect-appropriate, Distress- none acute. Looks well/neatly groomed. Skin- rash-none,  excoriation- none Lymphadenopathy- none Head- atraumatic            Eyes- Gross vision intact, PERRLA, conjunctivae clear secretions            Ears- Hearing, canals-normal            Nose- Clear, no-Septal dev, mucus, polyps, erosion, perforation             Throat- Mallampati II , mucosa  very dry , drainage- none, tonsils- atrophic Neck- flexible , trachea midline, no stridor , thyroid nl, carotid no bruit Chest - symmetrical excursion , unlabored           Heart/CV- RRR , no murmur , no gallop  , no rub, nl s1 s2                           - JVD- none , edema- none, stasis changes- none, varices- none           Lung-   Cough + deep, +  faint basilar crackles L>R  , unlabored, no wheeze , dullness-none, rub-none.  Chest wall-  Abd-  Br/ Gen/ Rectal- Not done, not indicated Extrem-   No-clubbing, none, atrophy- none, strength- nl for age. + Cool/dusky feet Neuro- grossly intact to observation

## 2016-03-18 NOTE — Patient Instructions (Addendum)
With a copy of this note, I will suggest Dr Rex Kras and Dr Verlin Fester consider changing from telmisartan and amlodipine for a month or so, to see if the itching will stop, since Mrs. Lodato believes they are the culprits.  Please call as needed

## 2016-03-19 NOTE — Assessment & Plan Note (Signed)
Her lower left lung was the primary involved area for many years. 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 She did not tolerate treatments for [facility]. Her other medical problems including loss of vision and chronic arthritis pain dominate her attention now.

## 2016-03-19 NOTE — Assessment & Plan Note (Signed)
Localization of pruritus without visible rash, lasting for months, along arm and anterior chest raises some comparison with fixed drug eruption. Since it crosses midline, nerve root irritation related to her known degenerative disc disease seems less likely. She thinks it is related to a medication and from the timing she blames telmisartan or amlodipine. She will follow up as planned with her allergist.

## 2016-04-05 DIAGNOSIS — N390 Urinary tract infection, site not specified: Secondary | ICD-10-CM | POA: Diagnosis not present

## 2016-04-05 DIAGNOSIS — R309 Painful micturition, unspecified: Secondary | ICD-10-CM | POA: Diagnosis not present

## 2016-05-15 DIAGNOSIS — J040 Acute laryngitis: Secondary | ICD-10-CM | POA: Diagnosis not present

## 2016-05-15 DIAGNOSIS — J410 Simple chronic bronchitis: Secondary | ICD-10-CM | POA: Diagnosis not present

## 2016-05-15 DIAGNOSIS — J320 Chronic maxillary sinusitis: Secondary | ICD-10-CM | POA: Diagnosis not present

## 2016-05-15 DIAGNOSIS — J321 Chronic frontal sinusitis: Secondary | ICD-10-CM | POA: Diagnosis not present

## 2016-05-15 DIAGNOSIS — J322 Chronic ethmoidal sinusitis: Secondary | ICD-10-CM | POA: Diagnosis not present

## 2016-05-15 DIAGNOSIS — B370 Candidal stomatitis: Secondary | ICD-10-CM | POA: Diagnosis not present

## 2016-05-21 DIAGNOSIS — I10 Essential (primary) hypertension: Secondary | ICD-10-CM | POA: Diagnosis not present

## 2016-05-21 DIAGNOSIS — Z7984 Long term (current) use of oral hypoglycemic drugs: Secondary | ICD-10-CM | POA: Diagnosis not present

## 2016-05-21 DIAGNOSIS — E559 Vitamin D deficiency, unspecified: Secondary | ICD-10-CM | POA: Diagnosis not present

## 2016-05-21 DIAGNOSIS — E119 Type 2 diabetes mellitus without complications: Secondary | ICD-10-CM | POA: Diagnosis not present

## 2016-05-23 DIAGNOSIS — J320 Chronic maxillary sinusitis: Secondary | ICD-10-CM | POA: Diagnosis not present

## 2016-05-23 DIAGNOSIS — J322 Chronic ethmoidal sinusitis: Secondary | ICD-10-CM | POA: Diagnosis not present

## 2016-05-23 DIAGNOSIS — J370 Chronic laryngitis: Secondary | ICD-10-CM | POA: Diagnosis not present

## 2016-05-29 DIAGNOSIS — M79672 Pain in left foot: Secondary | ICD-10-CM | POA: Diagnosis not present

## 2016-05-29 DIAGNOSIS — I10 Essential (primary) hypertension: Secondary | ICD-10-CM | POA: Diagnosis not present

## 2016-05-29 DIAGNOSIS — M79671 Pain in right foot: Secondary | ICD-10-CM | POA: Diagnosis not present

## 2016-05-29 DIAGNOSIS — M47816 Spondylosis without myelopathy or radiculopathy, lumbar region: Secondary | ICD-10-CM | POA: Diagnosis not present

## 2016-05-29 DIAGNOSIS — Z7984 Long term (current) use of oral hypoglycemic drugs: Secondary | ICD-10-CM | POA: Diagnosis not present

## 2016-05-29 DIAGNOSIS — E119 Type 2 diabetes mellitus without complications: Secondary | ICD-10-CM | POA: Diagnosis not present

## 2016-06-03 DIAGNOSIS — H353223 Exudative age-related macular degeneration, left eye, with inactive scar: Secondary | ICD-10-CM | POA: Diagnosis not present

## 2016-06-03 DIAGNOSIS — H353114 Nonexudative age-related macular degeneration, right eye, advanced atrophic with subfoveal involvement: Secondary | ICD-10-CM | POA: Diagnosis not present

## 2016-06-03 DIAGNOSIS — H43813 Vitreous degeneration, bilateral: Secondary | ICD-10-CM | POA: Diagnosis not present

## 2016-06-03 DIAGNOSIS — H35433 Paving stone degeneration of retina, bilateral: Secondary | ICD-10-CM | POA: Diagnosis not present

## 2016-06-14 DIAGNOSIS — M48061 Spinal stenosis, lumbar region without neurogenic claudication: Secondary | ICD-10-CM | POA: Diagnosis not present

## 2016-06-17 ENCOUNTER — Other Ambulatory Visit: Payer: Self-pay | Admitting: Neurosurgery

## 2016-06-17 DIAGNOSIS — M48061 Spinal stenosis, lumbar region without neurogenic claudication: Secondary | ICD-10-CM

## 2016-06-18 ENCOUNTER — Ambulatory Visit
Admission: RE | Admit: 2016-06-18 | Discharge: 2016-06-18 | Disposition: A | Discharge status: Home / Self Care | Payer: Medicare Other | Source: Ambulatory Visit | Attending: Neurosurgery | Admitting: Neurosurgery

## 2016-06-18 DIAGNOSIS — M48061 Spinal stenosis, lumbar region without neurogenic claudication: Secondary | ICD-10-CM

## 2016-06-19 DIAGNOSIS — M48061 Spinal stenosis, lumbar region without neurogenic claudication: Secondary | ICD-10-CM | POA: Diagnosis not present

## 2016-06-19 DIAGNOSIS — M4310 Spondylolisthesis, site unspecified: Secondary | ICD-10-CM | POA: Diagnosis not present

## 2016-06-20 DIAGNOSIS — M47816 Spondylosis without myelopathy or radiculopathy, lumbar region: Secondary | ICD-10-CM | POA: Diagnosis not present

## 2016-06-20 DIAGNOSIS — I10 Essential (primary) hypertension: Secondary | ICD-10-CM | POA: Diagnosis not present

## 2016-06-20 DIAGNOSIS — M79671 Pain in right foot: Secondary | ICD-10-CM | POA: Diagnosis not present

## 2016-06-20 DIAGNOSIS — Z7984 Long term (current) use of oral hypoglycemic drugs: Secondary | ICD-10-CM | POA: Diagnosis not present

## 2016-06-20 DIAGNOSIS — M79672 Pain in left foot: Secondary | ICD-10-CM | POA: Diagnosis not present

## 2016-06-20 DIAGNOSIS — E119 Type 2 diabetes mellitus without complications: Secondary | ICD-10-CM | POA: Diagnosis not present

## 2016-06-24 DIAGNOSIS — J322 Chronic ethmoidal sinusitis: Secondary | ICD-10-CM | POA: Diagnosis not present

## 2016-06-24 DIAGNOSIS — J040 Acute laryngitis: Secondary | ICD-10-CM | POA: Diagnosis not present

## 2016-06-24 DIAGNOSIS — R05 Cough: Secondary | ICD-10-CM | POA: Diagnosis not present

## 2016-06-24 DIAGNOSIS — J320 Chronic maxillary sinusitis: Secondary | ICD-10-CM | POA: Diagnosis not present

## 2016-07-08 DIAGNOSIS — M7742 Metatarsalgia, left foot: Secondary | ICD-10-CM | POA: Diagnosis not present

## 2016-08-05 DIAGNOSIS — W57XXXA Bitten or stung by nonvenomous insect and other nonvenomous arthropods, initial encounter: Secondary | ICD-10-CM | POA: Diagnosis not present

## 2016-08-05 DIAGNOSIS — S1086XA Insect bite of other specified part of neck, initial encounter: Secondary | ICD-10-CM | POA: Diagnosis not present

## 2016-08-14 ENCOUNTER — Other Ambulatory Visit: Payer: Self-pay | Admitting: Pulmonary Disease

## 2016-08-14 DIAGNOSIS — E871 Hypo-osmolality and hyponatremia: Secondary | ICD-10-CM | POA: Diagnosis not present

## 2016-09-19 ENCOUNTER — Ambulatory Visit (INDEPENDENT_AMBULATORY_CARE_PROVIDER_SITE_OTHER): Payer: Medicare Other | Admitting: Internal Medicine

## 2016-09-19 ENCOUNTER — Encounter: Payer: Self-pay | Admitting: Internal Medicine

## 2016-09-19 VITALS — BP 114/64 | HR 75 | Ht 68.0 in | Wt 152.0 lb

## 2016-09-19 DIAGNOSIS — J479 Bronchiectasis, uncomplicated: Secondary | ICD-10-CM

## 2016-09-19 DIAGNOSIS — J471 Bronchiectasis with (acute) exacerbation: Secondary | ICD-10-CM

## 2016-09-19 DIAGNOSIS — A310 Pulmonary mycobacterial infection: Secondary | ICD-10-CM

## 2016-09-19 MED ORDER — BENZONATATE 200 MG PO CAPS: 200.0000 mg | ORAL_CAPSULE | Freq: Three times a day (TID) | ORAL | 2 refills | Status: DC | PRN

## 2016-09-19 MED ORDER — PROMETHAZINE-CODEINE 6.25-10 MG/5ML PO SYRP: 5.0000 mL | ORAL_SOLUTION | Freq: Four times a day (QID) | ORAL | 0 refills | Status: DC | PRN

## 2016-09-19 NOTE — Progress Notes (Addendum)
HPI F never smoker, followed for bronchiectasis/ chronic bronchitis/ MAIC, complicated by hx of GERD, DM  Sputum Cx 06/09/12 POS MAIC  Started 07/14/12- Biaxin 500mg  TIW, EMB 1200 TIW, Rifabutin 300 TIW  ended- 08/12/12 due to weakness and malaise,  CT chest 06/01/12- Progressive bilateral nodular bronchiectasis Office spirometry  01/18/13- moderate obstructive airways disease-FVC 2.10/69%, FEV1 1.25/58%,  FEV1/FVC  0.59/ 84%,  FEF 25-75% 0.47/33% --------------------------------------------------------------------------------  03/18/2016-83 year old female never smoker followed for bronchiectasis/chronic bronchitis/MAIC, complicated by history GERD, DM, arthritis,hyponatremia  Being treated by Dr. Tilden Dome for pruritus with xyzal and hydroxyzine FOLLOWS FOR: Pt states she is doing okay-unable to do much anymore; mainly having issues with her vision so unable to do much. Denies any wheezing or SOB. Couldn't tolerate levaquin/ zith/ rifabutin from ID due to weakness.  Breathing has been better with less cough on these antihistamines, but they don't help the itching. She blames telmisartan and amlodipine, but not Maxzide. No rash. Itching has waxed and waned over 4 months, never gone for long. Mainly confined to a full R surface of right arm and across anterior chest.  09/19/16-83 year old female never smoker followed for bronchiectasis/chronic bronchitis/MAIC, complicated by history GERD, DM, arthritis,hyponatremia  FOLLOW UP FOR patient is here for a follow up patient's cough has gotten worse and her SOB she states that she is more SOB when she walks  Using nebulizer several times daily trying to control chronic cough. Some shortness of breath. Tries to be active, limited by eyesight and arthritis. But does a little gardening and housework. She has had productive cough virtually every day for years. At times she is not strong enough to clear her airways effectively. Flutter device has not  worked well enough.  ROS-see HPI Constitutional:     No-weight loss, night sweats, no-fevers, chills,  +fatigue, lassitude. HEENT:   No-  headaches, difficulty swallowing, +tooth/dental problems, no-sore throat,       No-  sneezing, itching, ear ache, nasal congestion, post nasal drip,  CV:  No-   chest pain, orthopnea, PND, swelling in lower extremities, anasarca,  dizziness, palpitations Resp: +Mild  shortness of breath with exertion or at rest.             +productive cough,  + non-productive cough,  No- coughing up of blood.              No-   change in color of mucus.  No- wheezing.   Skin: No-   rash or lesions. GI:  No-   heartburn, indigestion, abdominal pain, nausea, vomiting,  GU:  MS:  +Active low back and bilateral hip pain Neuro-     complaint of generalized weakness without myalgias Psych:  No- change in mood or affect. No depression or anxiety.  No memory loss.  General- Alert, Oriented, Affect-appropriate, Distress- none acute. Looks well/neatly groomed. Skin- rash-none,  excoriation- none Lymphadenopathy- none Head- atraumatic            Eyes- + decreased vision            Ears- Hearing, canals-normal            Nose- Clear, no-Septal dev, mucus, polyps, erosion, perforation             Throat- Mallampati II , mucosa very dry , drainage- none, tonsils- atrophic Neck- flexible , trachea midline, no stridor , thyroid nl, carotid no bruit Chest - symmetrical excursion , unlabored           Heart/CV- RRR ,  no murmur , no gallop  , no rub, nl s1 s2                           - JVD- none , edema- none, stasis changes- none, varices- none           Lung-   Cough + deep/rattling, + faint basilar crackles L>R  , unlabored, no wheeze , dullness-none, rub-none.  Chest wall-  Abd-  Br/ Gen/ Rectal- Not done, not indicated Extrem-   No-clubbing, none, atrophy- none, strength- nl for age. + Cool/dusky feet Neuro- grossly intact to observation

## 2016-09-19 NOTE — Patient Instructions (Addendum)
Order-  CT chest - high resolution   No contrast    Dx  MAIC, question bronchiectasis  Script sent for benzonatate perles for cough  Script printed for codeine cough syrup

## 2016-09-22 NOTE — Assessment & Plan Note (Signed)
We need to recheck status with high resolution. She is having trouble clearing obvious retained secretions for updated culture. She had failed to tolerate previous therapy 4 years ago. She might be a candidate for new inhaled therapy when it comes on market.

## 2016-09-22 NOTE — Assessment & Plan Note (Addendum)
We discussed the possibility that she could qualify for a pneumatic vest to help clear chronic retained secretions. Daily cough has been productive for years. A flutter device has been insufficient. Cough is exhausting for her. We went over our usual guidelines for cough control, with emphasis on postnasal drip and reflux precautions. Plan-CT chest high resolution

## 2016-09-24 ENCOUNTER — Ambulatory Visit (INDEPENDENT_AMBULATORY_CARE_PROVIDER_SITE_OTHER)
Admission: RE | Admit: 2016-09-24 | Discharge: 2016-09-24 | Disposition: A | Discharge status: Home / Self Care | Payer: Medicare Other | Source: Ambulatory Visit | Attending: Internal Medicine | Admitting: Internal Medicine

## 2016-09-24 DIAGNOSIS — J479 Bronchiectasis, uncomplicated: Secondary | ICD-10-CM | POA: Diagnosis not present

## 2016-09-24 DIAGNOSIS — R918 Other nonspecific abnormal finding of lung field: Secondary | ICD-10-CM | POA: Diagnosis not present

## 2016-09-27 ENCOUNTER — Other Ambulatory Visit: Payer: Self-pay | Admitting: Internal Medicine

## 2016-09-27 DIAGNOSIS — J479 Bronchiectasis, uncomplicated: Secondary | ICD-10-CM

## 2016-10-03 ENCOUNTER — Telehealth: Payer: Self-pay | Admitting: Internal Medicine

## 2016-10-03 MED ORDER — DOXYCYCLINE HYCLATE 100 MG PO TABS
ORAL_TABLET | ORAL | 0 refills | Status: DC
Start: 2016-10-03 — End: 2016-12-20

## 2016-10-03 MED ORDER — BENZONATATE 100 MG PO CAPS: ORAL_CAPSULE | ORAL | 0 refills | Status: DC

## 2016-10-03 NOTE — Telephone Encounter (Signed)
Offer doxycycline 100 mg, # 8, 2 today then one daily  Tessalon perles 200 mg, # 30, 1 every 8 hours if needed for coiugh

## 2016-10-03 NOTE — Telephone Encounter (Signed)
Spoke with pt. States that she is not feeling well. Reports cough, SOB, chest tightness and wheezing. Cough is causing lots of rib pain but is non productive. Denies fever. Symptoms started a few weeks ago. Her vibratory vest came in the mail today. Would like to have something called in.  CY - please advise. Thanks.  Allergies  Allergen Reactions  . Welchol [Colesevelam Hcl] Hives  . Glucosamine Other (See Comments)    Unknown  . Tramadol Nausea Only  . Celecoxib Hives, Itching and Rash  . Statins Rash   Current Outpatient Prescriptions on File Prior to Visit  Medication Sig Dispense Refill  . acetaminophen (TYLENOL) 325 MG tablet Take 2 tablets (650 mg total) by mouth every 6 (six) hours as needed for pain. (Patient taking differently: Take 325 mg by mouth every 6 (six) hours as needed for pain. )    . allopurinol (ZYLOPRIM) 100 MG tablet Take 1 tablet by mouth daily.    Marland Kitchen ALPRAZolam (XANAX) 0.25 MG tablet Take 0.25 mg by mouth 2 (two) times daily as needed for sleep or anxiety. For anxiety.    Marland Kitchen amLODipine (NORVASC) 2.5 MG tablet Take 2.5 mg by mouth daily.     . Artificial Tear Ointment (DRY EYES OP) Apply 1 drop to eye 2 (two) times daily as needed (for dry eyes).     Marland Kitchen aspirin EC 81 MG tablet Take 81 mg by mouth every morning.    . benzonatate (TESSALON) 100 MG capsule TAKE ONE CAPSULE BY MOUTH 3 TIMES A DAY AS NEEDED FOR COUGH  0  . benzonatate (TESSALON) 200 MG capsule Take 1 capsule (200 mg total) by mouth 3 (three) times daily as needed for cough. 30 capsule 2  . budesonide (PULMICORT) 0.25 MG/2ML nebulizer solution Take 2 mLs (0.25 mg total) by nebulization daily. (Patient taking differently: Take 0.25 mg by nebulization 3 (three) times daily as needed (for shortness of breath). ) 60 mL 12  . cholecalciferol (VITAMIN D) 1000 UNITS tablet Take 2,000 Units by mouth every morning.     . Cinnamon 500 MG capsule Take 500 mg by mouth every morning.     Marland Kitchen esomeprazole (NEXIUM) 40  MG capsule Take 40 mg by mouth every morning.     . hydrOXYzine (ATARAX) 10 MG/5ML syrup Take 12.5 mLs (25 mg total) by mouth daily as needed. One teaspoonfull at night for itching 240 mL 5  . ibuprofen (ADVIL,MOTRIN) 200 MG tablet Take 200-400 mg by mouth every 6 (six) hours as needed for headache or moderate pain.    Marland Kitchen ipratropium (ATROVENT) 0.02 % nebulizer solution Take 2.5 mLs (500 mcg total) by nebulization 4 (four) times daily. 360 mL 12  . levocetirizine (XYZAL) 5 MG tablet Take 1 tablet (5 mg total) by mouth daily as needed for allergies. 30 tablet 5  . losartan (COZAAR) 50 MG tablet Take 50 mg by mouth daily.    . meclizine (ANTIVERT) 25 MG tablet Take 25 mg by mouth every 6 (six) hours as needed for dizziness.     . metFORMIN (GLUCOPHAGE) 500 MG tablet Take 1,000 mg by mouth 2 (two) times daily.     . Omega-3 Fatty Acids (FISH OIL) 1000 MG CAPS Take 1 capsule by mouth daily.    . ondansetron (ZOFRAN) 4 MG tablet Take 1 tablet (4 mg total) by mouth every Monday, Wednesday, and Friday. With breakfast 12 tablet 6  . ONE TOUCH ULTRA TEST test strip USE TO CHECK BLOOD SUGAR ONCE  DAILY 90  5  . ONETOUCH DELICA LANCETS 09U MISC USE TO CHECK BLOOD SUGAR ONCE DAILY AS DIRECTED  5  . PROAIR HFA 108 (90 Base) MCG/ACT inhaler INHALE 2 PUFFS INTO THE LUNGS EVERY 6 HOURS AS NEEDED FOR WHEEZING OR SHORTNESS OF BREATH 8.5 Inhaler 1  . promethazine-codeine (PHENERGAN WITH CODEINE) 6.25-10 MG/5ML syrup Take 5 mLs by mouth every 6 (six) hours as needed for cough. 200 mL 0  . Respiratory Therapy Supplies (FLUTTER) DEVI Use as directed 1 each 0  . rifabutin (MYCOBUTIN) 150 MG capsule Take 2 capsules (300 mg total) by mouth every Monday, Wednesday, and Friday. With dinner 24 capsule 5  . telmisartan (MICARDIS) 40 MG tablet Take 40 mg by mouth daily.    . traMADol (ULTRAM) 50 MG tablet Take 50 mg by mouth every 6 (six) hours as needed for moderate pain.    Marland Kitchen triamterene-hydrochlorothiazide (MAXZIDE) 75-50 MG  per tablet Take 0.5 tablets by mouth every other day.     . vitamin E 400 UNIT capsule Take 400 Units by mouth daily.     No current facility-administered medications on file prior to visit.

## 2016-10-15 DIAGNOSIS — E119 Type 2 diabetes mellitus without complications: Secondary | ICD-10-CM | POA: Diagnosis not present

## 2016-10-15 DIAGNOSIS — H353134 Nonexudative age-related macular degeneration, bilateral, advanced atrophic with subfoveal involvement: Secondary | ICD-10-CM | POA: Diagnosis not present

## 2016-10-15 DIAGNOSIS — J322 Chronic ethmoidal sinusitis: Secondary | ICD-10-CM | POA: Diagnosis not present

## 2016-10-15 DIAGNOSIS — H53453 Other localized visual field defect, bilateral: Secondary | ICD-10-CM | POA: Diagnosis not present

## 2016-10-15 DIAGNOSIS — J410 Simple chronic bronchitis: Secondary | ICD-10-CM | POA: Diagnosis not present

## 2016-10-15 DIAGNOSIS — H16223 Keratoconjunctivitis sicca, not specified as Sjogren's, bilateral: Secondary | ICD-10-CM | POA: Diagnosis not present

## 2016-10-15 DIAGNOSIS — J370 Chronic laryngitis: Secondary | ICD-10-CM | POA: Diagnosis not present

## 2016-10-15 DIAGNOSIS — H43813 Vitreous degeneration, bilateral: Secondary | ICD-10-CM | POA: Diagnosis not present

## 2016-10-15 DIAGNOSIS — J320 Chronic maxillary sinusitis: Secondary | ICD-10-CM | POA: Diagnosis not present

## 2016-10-17 DIAGNOSIS — H353114 Nonexudative age-related macular degeneration, right eye, advanced atrophic with subfoveal involvement: Secondary | ICD-10-CM | POA: Diagnosis not present

## 2016-10-17 DIAGNOSIS — E1165 Type 2 diabetes mellitus with hyperglycemia: Secondary | ICD-10-CM | POA: Diagnosis not present

## 2016-10-17 DIAGNOSIS — H35433 Paving stone degeneration of retina, bilateral: Secondary | ICD-10-CM | POA: Diagnosis not present

## 2016-10-17 DIAGNOSIS — I10 Essential (primary) hypertension: Secondary | ICD-10-CM | POA: Diagnosis not present

## 2016-10-17 DIAGNOSIS — H353223 Exudative age-related macular degeneration, left eye, with inactive scar: Secondary | ICD-10-CM | POA: Diagnosis not present

## 2016-10-17 DIAGNOSIS — Z7984 Long term (current) use of oral hypoglycemic drugs: Secondary | ICD-10-CM | POA: Diagnosis not present

## 2016-10-17 DIAGNOSIS — R609 Edema, unspecified: Secondary | ICD-10-CM | POA: Diagnosis not present

## 2016-10-17 DIAGNOSIS — H43813 Vitreous degeneration, bilateral: Secondary | ICD-10-CM | POA: Diagnosis not present

## 2016-10-24 ENCOUNTER — Other Ambulatory Visit: Payer: Self-pay | Admitting: Internal Medicine

## 2016-10-24 NOTE — Telephone Encounter (Signed)
CY Please advise on refills. Thanks.

## 2016-10-24 NOTE — Telephone Encounter (Signed)
Ok to refill both?? 

## 2016-11-04 DIAGNOSIS — J040 Acute laryngitis: Secondary | ICD-10-CM | POA: Diagnosis not present

## 2016-11-04 DIAGNOSIS — R05 Cough: Secondary | ICD-10-CM | POA: Diagnosis not present

## 2016-11-04 DIAGNOSIS — J410 Simple chronic bronchitis: Secondary | ICD-10-CM | POA: Diagnosis not present

## 2016-11-06 DIAGNOSIS — R002 Palpitations: Secondary | ICD-10-CM | POA: Diagnosis not present

## 2016-11-06 DIAGNOSIS — D649 Anemia, unspecified: Secondary | ICD-10-CM | POA: Diagnosis not present

## 2016-11-06 DIAGNOSIS — I10 Essential (primary) hypertension: Secondary | ICD-10-CM | POA: Diagnosis not present

## 2016-11-07 ENCOUNTER — Telehealth: Payer: Self-pay

## 2016-11-07 NOTE — Telephone Encounter (Signed)
SENT NOTES TO SCHEDULING 

## 2016-11-11 ENCOUNTER — Telehealth: Payer: Self-pay

## 2016-11-11 NOTE — Telephone Encounter (Signed)
ERROR

## 2016-11-12 ENCOUNTER — Other Ambulatory Visit: Payer: Self-pay | Admitting: Internal Medicine

## 2016-11-12 ENCOUNTER — Ambulatory Visit (INDEPENDENT_AMBULATORY_CARE_PROVIDER_SITE_OTHER): Payer: Medicare Other | Admitting: Cardiology

## 2016-11-12 ENCOUNTER — Encounter: Payer: Self-pay | Admitting: Cardiology

## 2016-11-12 VITALS — BP 120/62 | HR 86 | Ht 68.0 in | Wt 151.4 lb

## 2016-11-12 DIAGNOSIS — J479 Bronchiectasis, uncomplicated: Secondary | ICD-10-CM | POA: Diagnosis not present

## 2016-11-12 DIAGNOSIS — I10 Essential (primary) hypertension: Secondary | ICD-10-CM | POA: Diagnosis not present

## 2016-11-12 DIAGNOSIS — R002 Palpitations: Secondary | ICD-10-CM

## 2016-11-12 NOTE — Patient Instructions (Signed)
Medication Instructions:  Your physician recommends that you continue on your current medications as directed. Please refer to the Current Medication list given to you today.  Labwork: none  Testing/Procedures: none  Follow-Up: As needed   If you need a refill on your cardiac medications before your next appointment, please call your pharmacy.  

## 2016-11-12 NOTE — Progress Notes (Signed)
Cardiology Office Note:    Date:  11/12/2016   ID:  Katie Alvarado, DOB July 25, 1925, MRN [id number]  PCP:  Katie Fess, MD  Cardiologist:  Katie Furbish, MD   Referring MD: Katie Fess, MD     History of Present Illness:    Katie Alvarado is a 83 y.o. female with a hx of COPD, diabetes here for evaluation of palpitations at the request of Dr. Rex Alvarado.  Had seen her in the past because of hypertension. Short one point was in the 287 systolic range. Renal artery duplex was suggestive of renal artery stenosis. She is a nonsmoker her mother lived to age 3. I referred her to Dr. Fletcher Alvarado. Her peak velocity was less than 200 cm/s bilaterally suggestive of moderate bilateral renal artery stenosis at is most likely not hemodynamically significant. Currently no indication for revascularization.  In review of Dr. Eddie Alvarado note from 11/06/16 she was noticing palpitations daily about 3-4 times a day over the last several weeks with no chest pain, no syncope, no lightheadedness. Usually last a few seconds duration.  Cough - Dr. Annamaria Alvarado. COPD. Codine. Maybe funny feeling in epigastric region. Feels like for a second fall out. Since less codeine this has improved.   Past Medical History:  Diagnosis Date  . Acute nasopharyngitis (common cold)   . Arthritis   . Bronchiectasis with acute exacerbation ([place])   . Cancer ([place])    skin cancer  . COPD (chronic obstructive pulmonary disease) ([place])    sees dr. Annamaria Alvarado   . Cough   . Diabetes mellitus    fasting 130-150  . Dizziness   . Esophageal reflux   . Hypertension   . Insomnia, unspecified   . Other symptoms involving nervous and musculoskeletal systems(781.99)   . Pneumonia    hx of  . Shortness of breath    exertion    Past Surgical History:  Procedure Laterality Date  . ABDOMINAL HYSTERECTOMY    . ANKLE FRACTURE SURGERY Left   . APPENDECTOMY    . BASAL CELL CARCINOMA EXCISION     NOSE  . CATARACT EXTRACTION, BILATERAL     OUT PATIENT  .  COLON SURGERY     colon resection for diverticulitis  . LUMBAR LAMINECTOMY/DECOMPRESSION MICRODISCECTOMY N/A 02/15/2013   Procedure: LUMBAR LAMINECTOMY/DECOMPRESSION MICRODISCECTOMY LUMBAR FOUR-FIVE;  Surgeon: Katie Connors, MD;  Location: [facility];  Service: Neurosurgery;  Laterality: N/A;  . VARICOSE VEIN SURGERY      Current Medications: Current Meds  Medication Sig  . acetaminophen (TYLENOL) 325 MG tablet Take 2 tablets (650 mg total) by mouth every 6 (six) hours as needed for pain. (Patient taking differently: Take 325 mg by mouth every 6 (six) hours as needed for pain. )  . allopurinol (ZYLOPRIM) 100 MG tablet Take 1 tablet by mouth daily.  Marland Kitchen ALPRAZolam (XANAX) 0.25 MG tablet Take 0.25 mg by mouth 2 (two) times daily as needed for sleep or anxiety. For anxiety.  Marland Kitchen amLODipine (NORVASC) 2.5 MG tablet Take 2.5 mg by mouth daily.   . Artificial Tear Ointment (DRY EYES OP) Apply 1 drop to eye 2 (two) times daily as needed (for dry eyes).   Marland Kitchen aspirin EC 81 MG tablet Take 81 mg by mouth every morning.  . benzonatate (TESSALON) 100 MG capsule TAKE ONE CAPSULE BY MOUTH EVERY 8 HOURS AS NEEDED  . benzonatate (TESSALON) 200 MG capsule Take 1 capsule (200 mg total) by mouth 3 (three) times daily as needed for cough.  Marland Kitchen  budesonide (PULMICORT) 0.25 MG/2ML nebulizer solution Take 2 mLs (0.25 mg total) by nebulization daily. (Patient taking differently: Take 0.25 mg by nebulization 3 (three) times daily as needed (for shortness of breath). )  . cholecalciferol (VITAMIN D) 1000 UNITS tablet Take 2,000 Units by mouth every morning.   . Cinnamon 500 MG capsule Take 500 mg by mouth every morning.   Marland Kitchen doxycycline (VIBRA-TABS) 100 MG tablet Take 2 tablets today, then 1 tablet daily  . esomeprazole (NEXIUM) 40 MG capsule Take 40 mg by mouth every morning.   . hydrOXYzine (ATARAX) 10 MG/5ML syrup Take 12.5 mLs (25 mg total) by mouth daily as needed. One teaspoonfull at night for itching  . ibuprofen  (ADVIL,MOTRIN) 200 MG tablet Take 200-400 mg by mouth every 6 (six) hours as needed for headache or moderate pain.  Marland Kitchen ipratropium (ATROVENT) 0.02 % nebulizer solution Take 2.5 mLs (500 mcg total) by nebulization 4 (four) times daily.  Marland Kitchen levocetirizine (XYZAL) 5 MG tablet Take 1 tablet (5 mg total) by mouth daily as needed for allergies.  Marland Kitchen losartan (COZAAR) 50 MG tablet Take 50 mg by mouth daily.  . meclizine (ANTIVERT) 25 MG tablet Take 25 mg by mouth every 6 (six) hours as needed for dizziness.   . metFORMIN (GLUCOPHAGE) 500 MG tablet Take 1,000 mg by mouth 2 (two) times daily.   . Omega-3 Fatty Acids (FISH OIL) 1000 MG CAPS Take 1 capsule by mouth daily.  . ondansetron (ZOFRAN) 4 MG tablet Take 1 tablet (4 mg total) by mouth every Monday, Wednesday, and Friday. With breakfast  . ONE TOUCH ULTRA TEST test strip USE TO CHECK BLOOD SUGAR ONCE DAILY 90  . ONETOUCH DELICA LANCETS 72C MISC USE TO CHECK BLOOD SUGAR ONCE DAILY AS DIRECTED  . PROAIR HFA 108 (90 Base) MCG/ACT inhaler INHALE 2 PUFFS INTO THE LUNGS EVERY 6 HOURS AS NEEDED FOR WHEEZING OR SHORTNESS OF BREATH  . promethazine-codeine (PHENERGAN WITH CODEINE) 6.25-10 MG/5ML syrup TAKE 5 ML BY MOUTH EVERY 6 HOURS AS NEEDED FOR COUGH  . Respiratory Therapy Supplies (FLUTTER) DEVI Use as directed  . rifabutin (MYCOBUTIN) 150 MG capsule Take 2 capsules (300 mg total) by mouth every Monday, Wednesday, and Friday. With dinner  . telmisartan (MICARDIS) 40 MG tablet Take 40 mg by mouth daily.  . traMADol (ULTRAM) 50 MG tablet Take 50 mg by mouth every 6 (six) hours as needed for moderate pain.  Marland Kitchen triamterene-hydrochlorothiazide (MAXZIDE) 75-50 MG per tablet Take 0.5 tablets by mouth every other day.   . vitamin E 400 UNIT capsule Take 400 Units by mouth daily.     Allergies:   Welchol [colesevelam hcl]; Glucosamine; Tramadol; Celecoxib; and Statins   Social History   Social History  . Marital status: Widowed    Spouse name: N/A  . Number of  children: 2  . Years of education: N/A   Social History Main Topics  . Smoking status: Never Smoker  . Smokeless tobacco: Never Used  . Alcohol use Yes     Comment: "Hardly Ever"   . Drug use: No  . Sexual activity: Not Currently    Birth control/ protection: Post-menopausal   Other Topics Concern  . None   Social History Narrative  . None     Family History: The patient's family history includes Chronic bronchitis in her father; Other in her brother and sister. ROS:   Please see the history of present illness.     All other systems reviewed and are  negative.  EKGs/Labs/Other Studies Reviewed:    The following studies were reviewed today: Prior office notes, lab work reviewed  EKG:  EKG is  ordered today.  The ekg ordered today demonstrates 11/12/16-sinus rhythm, 86 with no other abnormalities personally viewed  Recent Labs: 03/05/2016: ALT 11; BUN 16; Creat 0.88; Hemoglobin 12.0; Platelets 313; Potassium 4.5; Sodium 129; TSH 2.09  Recent Lipid Panel No results found for: CHOL, TRIG, HDL, CHOLHDL, VLDL, LDLCALC, LDLDIRECT  Physical Exam:    VS:  BP 120/62   Pulse 86   Ht 5\' 8"  (1.727 m)   Wt 151 lb 6.4 oz (68.7 kg)   SpO2 97%   BMI 23.02 kg/m     Wt Readings from Last 3 Encounters:  11/12/16 151 lb 6.4 oz (68.7 kg)  09/19/16 152 lb (68.9 kg)  03/18/16 153 lb 3.2 oz (69.5 kg)     GEN:  Well nourished, well developed in no acute distress, Elderly HEENT: Normal, decreased eyesight NECK: No JVD; No carotid bruits LYMPHATICS: No lymphadenopathy CARDIAC: RRR, no murmurs, rubs, gallops RESPIRATORY:  Occasional wheeze heard bilaterally ABDOMEN: Soft, non-tender, non-distended MUSCULOSKELETAL:  No edema; No deformity  SKIN: Warm and dry NEUROLOGIC:  Alert and oriented x 3 PSYCHIATRIC:  Normal affect   ASSESSMENT:    1. Palpitations   2. Essential hypertension   3. Bronchiectasis without complication ([place])    PLAN:    In order of problems listed  above:  Palpitations  - These have improved since she has cut back on the cough syrup with codeine.  - No high-risk symptoms such as syncope associated with her palpitations.  - She does feel like placing a monitor her would be challenging especially with her chronic cough.  - If symptoms worsen or become more worrisome, we can always place a monitor at that time. For right now, continue with conservative management. No changes.  Essential hypertension  - Dr. Rex Alvarado has her under excellent control. Medications reviewed.  Bronchiectasis  - Chronic cough.   Medication Adjustments/Labs and Tests Ordered: Current medicines are reviewed at length with the patient today.  Concerns regarding medicines are outlined above.  Orders Placed This Encounter  Procedures  . EKG 12-Lead   No orders of the defined types were placed in this encounter.   Signed, Katie Furbish, MD  11/12/2016 4:29 PM    [place]

## 2016-11-21 DIAGNOSIS — D649 Anemia, unspecified: Secondary | ICD-10-CM | POA: Diagnosis not present

## 2016-11-28 ENCOUNTER — Other Ambulatory Visit: Payer: Self-pay | Admitting: Internal Medicine

## 2016-11-28 DIAGNOSIS — H353114 Nonexudative age-related macular degeneration, right eye, advanced atrophic with subfoveal involvement: Secondary | ICD-10-CM | POA: Diagnosis not present

## 2016-11-28 DIAGNOSIS — H353223 Exudative age-related macular degeneration, left eye, with inactive scar: Secondary | ICD-10-CM | POA: Diagnosis not present

## 2016-11-29 NOTE — Telephone Encounter (Signed)
CY please advise on refill. Thanks. 

## 2016-11-29 NOTE — Telephone Encounter (Signed)
Ok to refill as before 

## 2016-12-18 DIAGNOSIS — D649 Anemia, unspecified: Secondary | ICD-10-CM | POA: Diagnosis not present

## 2016-12-18 DIAGNOSIS — E1165 Type 2 diabetes mellitus with hyperglycemia: Secondary | ICD-10-CM | POA: Diagnosis not present

## 2016-12-18 DIAGNOSIS — Z7984 Long term (current) use of oral hypoglycemic drugs: Secondary | ICD-10-CM | POA: Diagnosis not present

## 2016-12-18 DIAGNOSIS — Z23 Encounter for immunization: Secondary | ICD-10-CM | POA: Diagnosis not present

## 2016-12-18 DIAGNOSIS — I10 Essential (primary) hypertension: Secondary | ICD-10-CM | POA: Diagnosis not present

## 2016-12-20 ENCOUNTER — Ambulatory Visit (INDEPENDENT_AMBULATORY_CARE_PROVIDER_SITE_OTHER): Payer: Medicare Other | Admitting: Internal Medicine

## 2016-12-20 ENCOUNTER — Encounter: Payer: Self-pay | Admitting: Internal Medicine

## 2016-12-20 DIAGNOSIS — J471 Bronchiectasis with (acute) exacerbation: Secondary | ICD-10-CM

## 2016-12-20 DIAGNOSIS — J00 Acute nasopharyngitis [common cold]: Secondary | ICD-10-CM

## 2016-12-20 MED ORDER — AMOXICILLIN 500 MG PO TABS: 500.0000 mg | ORAL_TABLET | Freq: Two times a day (BID) | ORAL | 0 refills | Status: DC

## 2016-12-20 MED ORDER — PROMETHAZINE-CODEINE 6.25-10 MG/5ML PO SYRP: ORAL_SOLUTION | ORAL | 0 refills | Status: DC

## 2016-12-20 NOTE — Assessment & Plan Note (Signed)
I don't see postnasal drainage but she thinks she feels it and that it is contributing to her cough. Plan-Dymista nasal spray, amoxicillin

## 2016-12-20 NOTE — Progress Notes (Signed)
HPI F never smoker, followed for bronchiectasis/ chronic bronchitis/ MAIC, complicated by hx of GERD, DM , arthritis, aortic atherosclerosis Sputum Cx 06/09/12 POS MAIC  Started 07/14/12- Biaxin 500mg  TIW, EMB 1200 TIW, Rifabutin 300 TIW  ended- 08/12/12 due to weakness and malaise,  CT chest 06/01/12- Progressive bilateral nodular bronchiectasis Office spirometry  01/18/13- moderate obstructive airways disease-FVC 2.10/69%, FEV1 1.25/58%,  FEV1/FVC  0.59/ 84%,  FEF 25-75% 0.47/33% --------------------------------------------------------------------------------  09/19/16-83 year old female never smoker followed for bronchiectasis/chronic bronchitis/MAIC, complicated by history GERD, DM, arthritis,hyponatremia  FOLLOW UP FOR patient is here for a follow up patient's cough has gotten worse and her SOB she states that she is more SOB when she walks  Using nebulizer several times daily trying to control chronic cough. Some shortness of breath. Tries to be active, limited by eyesight and arthritis. But does a little gardening and housework. She has had productive cough virtually every day for years. At times she is not strong enough to clear her airways effectively. Flutter device has not worked well enough.  12/20/16- 83 year old female never smoker followed for bronchiectasis/chronic bronchitis/MAIC, complicated by history GERD, DM, arthritis,hyponatremia, aortic atherosclerosis Follows for: bronchiectasis, MAIC , pt reports cough is getting worse, clear mucus Chronic cough is wearing her down and may be somewhat worse over the summer without distinct acute exacerbation. Cough is often productive of white sputum but she thinks it is mostly triggered by postnasal drainage. Denies headache. Had seen Dr. Ernesto Rutherford 2 months ago with unknown antibiotic but can't remember if that helped. She does have codeine cough syrup which helps if needed, mainly at bedtime to avoid sedation. She says Perles are useless for her  and she has been intolerant of tramadol. Tussionex was too strong. Vest is not helping as much to clear her airways and reduce cough as we had hoped. CT chest 09/24/16 IMPRESSION: 1. 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 No significant progression of disease is noted on today's examination. 2. Aortic atherosclerosis, in addition to left main and 3 vessel coronary artery disease. Please note that although the presence of coronary artery calcium documents the presence of coronary artery disease, the severity of this disease and any potential stenosis cannot be assessed on this non-gated CT examination. Assessment for potential risk factor modification, dietary therapy or pharmacologic therapy may be warranted, if clinically indicated.   ROS-see HPI Constitutional:     No-weight loss, night sweats, no-fevers, chills,  +fatigue, lassitude. HEENT:   No-  headaches, difficulty swallowing, +tooth/dental problems, no-sore throat,       No-  sneezing, itching, ear ache, nasal congestion, post nasal drip,  CV:  No-   chest pain, orthopnea, PND, swelling in lower extremities, anasarca,  dizziness, palpitations Resp: +Mild  shortness of breath with exertion or at rest.             +productive cough,  + non-productive cough,  No- coughing up of blood.              No-   change in color of mucus.  No- wheezing.   Skin: No-   rash or lesions. GI:  No-   heartburn, indigestion, abdominal pain, nausea, vomiting,  GU:  MS:  +Active low back and bilateral hip pain Neuro-     complaint of generalized weakness without myalgias Psych:  No- change in mood or affect. No depression or anxiety.  No  memory loss.  General- Alert, Oriented, Affect-appropriate, Distress- none acute.+ Always looks well/neatly groomed. Skin- rash-none,  excoriation- none Lymphadenopathy- none Head-  atraumatic            Eyes- + decreased vision            Ears- Hearing, canals-normal            Nose- Clear, no-Septal dev, mucus, polyps, erosion, perforation             Throat- Mallampati II , mucosa very dry , drainage- none, tonsils- atrophic Neck- flexible , trachea midline, no stridor , thyroid nl, carotid no bruit Chest - symmetrical excursion , unlabored           Heart/CV- RRR , no murmur , no gallop  , no rub, nl s1 s2                           - JVD- none , edema- none, stasis changes- none, varices- none           Lung-   Cough + deep/rattling, + few crackles L>R  , unlabored, no wheeze , dullness-none, rub-none.  Chest wall-  Abd-  Br/ Gen/ Rectal- Not done, not indicated Extrem-   No-clubbing, none, atrophy- none, strength- nl for age. + Cool/dusky feet Neuro- grossly intact to observation

## 2016-12-20 NOTE — Assessment & Plan Note (Signed)
CT scan indicates stability but her cough is worse and exhausting. She thinks it is partly due to postnasal drainage so we are giving sample Dymista nasal spray to see if it dries that up. Okay to refill her promethazine with codeine cough syrup. She understands to be very careful about its sedating effect and uses it only at bedtime. Avoid falls. Try 10 days amoxicillin.

## 2016-12-20 NOTE — Patient Instructions (Signed)
Script sent for amoxacillin antibiotic  Script printed for codeine cough syrup  Sample Dymista nasal spray      Try 1-2 puffs each nostril once daily at bedtime. See if this helps with the drainage and cough.

## 2016-12-30 DIAGNOSIS — R300 Dysuria: Secondary | ICD-10-CM | POA: Diagnosis not present

## 2017-01-08 ENCOUNTER — Other Ambulatory Visit: Payer: Self-pay | Admitting: Internal Medicine

## 2017-01-08 NOTE — Telephone Encounter (Signed)
Ok to refill as requested 

## 2017-01-08 NOTE — Telephone Encounter (Signed)
CY please advise on refill. Thanks. 

## 2017-01-09 ENCOUNTER — Encounter (HOSPITAL_COMMUNITY): Payer: Self-pay | Admitting: Emergency Medicine

## 2017-01-09 ENCOUNTER — Emergency Department (HOSPITAL_COMMUNITY): Payer: Medicare Other

## 2017-01-09 ENCOUNTER — Emergency Department (HOSPITAL_COMMUNITY)
Admission: EM | Admit: 2017-01-09 | Discharge: 2017-01-09 | Disposition: A | Discharge status: Home / Self Care | Payer: Medicare Other | Attending: Emergency Medicine | Admitting: Emergency Medicine

## 2017-01-09 DIAGNOSIS — J449 Chronic obstructive pulmonary disease, unspecified: Secondary | ICD-10-CM | POA: Insufficient documentation

## 2017-01-09 DIAGNOSIS — M11262 Other chondrocalcinosis, left knee: Secondary | ICD-10-CM | POA: Diagnosis not present

## 2017-01-09 DIAGNOSIS — I10 Essential (primary) hypertension: Secondary | ICD-10-CM | POA: Insufficient documentation

## 2017-01-09 DIAGNOSIS — M25532 Pain in left wrist: Secondary | ICD-10-CM | POA: Diagnosis not present

## 2017-01-09 DIAGNOSIS — E119 Type 2 diabetes mellitus without complications: Secondary | ICD-10-CM | POA: Insufficient documentation

## 2017-01-09 DIAGNOSIS — Z79899 Other long term (current) drug therapy: Secondary | ICD-10-CM | POA: Diagnosis not present

## 2017-01-09 DIAGNOSIS — M11232 Other chondrocalcinosis, left wrist: Secondary | ICD-10-CM

## 2017-01-09 DIAGNOSIS — Z7984 Long term (current) use of oral hypoglycemic drugs: Secondary | ICD-10-CM | POA: Diagnosis not present

## 2017-01-09 DIAGNOSIS — M25539 Pain in unspecified wrist: Secondary | ICD-10-CM

## 2017-01-09 DIAGNOSIS — Z8739 Personal history of other diseases of the musculoskeletal system and connective tissue: Secondary | ICD-10-CM | POA: Diagnosis not present

## 2017-01-09 DIAGNOSIS — Z85828 Personal history of other malignant neoplasm of skin: Secondary | ICD-10-CM | POA: Insufficient documentation

## 2017-01-09 LAB — COMPREHENSIVE METABOLIC PANEL
ALBUMIN: 4.1 g/dL (ref 3.5–5.0)
ALK PHOS: 55 U/L (ref 38–126)
ALT: 12 U/L — AB (ref 14–54)
AST: 17 U/L (ref 15–41)
Anion gap: 11 (ref 5–15)
BUN: 17 mg/dL (ref 6–20)
CALCIUM: 9.4 mg/dL (ref 8.9–10.3)
CO2: 27 mmol/L (ref 22–32)
CREATININE: 0.78 mg/dL (ref 0.44–1.00)
Chloride: 93 mmol/L — ABNORMAL LOW (ref 101–111)
GFR calc Af Amer: >60 mL/min (ref >60)
GFR calc non Af Amer: >60 mL/min (ref >60)
GLUCOSE: 130 mg/dL — AB (ref 65–99)
Potassium: 4.6 mmol/L (ref 3.5–5.1)
SODIUM: 131 mmol/L — AB (ref 135–145)
Total Bilirubin: 0.8 mg/dL (ref 0.3–1.2)
Total Protein: 7 g/dL (ref 6.5–8.1)

## 2017-01-09 LAB — SYNOVIAL CELL COUNT + DIFF, W/ CRYSTALS
MONOCYTE-MACROPHAGE-SYNOVIAL FLUID: 7 % — AB (ref 50–90)
NEUTROPHIL, SYNOVIAL: 93 % — AB (ref 0–25)
WBC, SYNOVIAL: 38500 /mm3 — AB (ref 0–200)

## 2017-01-09 LAB — CBC WITH DIFFERENTIAL/PLATELET
BASOS ABS: 0 10*3/uL (ref 0.0–0.1)
BASOS PCT: 0 %
EOS ABS: 0.1 10*3/uL (ref 0.0–0.7)
Eosinophils Relative: 1 %
HCT: 32.8 % — ABNORMAL LOW (ref 36.0–46.0)
Hemoglobin: 11 g/dL — ABNORMAL LOW (ref 12.0–15.0)
LYMPHS ABS: 1.5 10*3/uL (ref 0.7–4.0)
Lymphocytes Relative: 16 %
MCH: 28.2 pg (ref 26.0–34.0)
MCHC: 33.5 g/dL (ref 30.0–36.0)
MCV: 84.1 fL (ref 78.0–100.0)
MONO ABS: 1.1 10*3/uL — AB (ref 0.1–1.0)
Monocytes Relative: 12 %
Neutro Abs: 6.7 10*3/uL (ref 1.7–7.7)
Neutrophils Relative %: 71 %
Platelets: 278 10*3/uL (ref 150–400)
RBC: 3.9 MIL/uL (ref 3.87–5.11)
RDW: 13.6 % (ref 11.5–15.5)
WBC: 9.4 10*3/uL (ref 4.0–10.5)

## 2017-01-09 MED ORDER — LIDOCAINE HCL (PF) 2 % IJ SOLN
10.0000 mL | Freq: Once | INTRAMUSCULAR | Status: DC
  Filled 2017-01-09: qty 10

## 2017-01-09 MED ORDER — OXYCODONE-ACETAMINOPHEN 5-325 MG PO TABS
0.5000 | ORAL_TABLET | Freq: Once | ORAL | Status: AC
  Administered 2017-01-09: 0.5 via ORAL
  Filled 2017-01-09: qty 1

## 2017-01-09 MED ORDER — HYDROCODONE-ACETAMINOPHEN 5-325 MG PO TABS: 0.5000 | ORAL_TABLET | ORAL | 0 refills | Status: DC | PRN

## 2017-01-09 MED ORDER — HYDROCODONE-ACETAMINOPHEN 5-325 MG PO TABS
0.5000 | ORAL_TABLET | Freq: Once | ORAL | Status: AC
  Administered 2017-01-09: 0.5 via ORAL
  Filled 2017-01-09: qty 1

## 2017-01-09 MED ORDER — LIDOCAINE HCL 2 % IJ SOLN: 10.0000 mL | Freq: Once | INTRAMUSCULAR | Status: DC

## 2017-01-09 NOTE — ED Notes (Signed)
Pt decline d/c vitals. She states "I check them when I get home." Ambulated out of the ED in no distress. Husband is driving home.

## 2017-01-09 NOTE — ED Triage Notes (Signed)
Pt complaint of left wrist swelling, redness, and pain noted 4 days ago. Concern for infection in joint; sent by PCP.

## 2017-01-09 NOTE — ED Notes (Signed)
Coming from PCP-left wrist with possible infection

## 2017-01-09 NOTE — ED Notes (Signed)
Abundant WBCs both poly and mononuclear. No organisms seen.

## 2017-01-09 NOTE — Discharge Instructions (Signed)
You have been seen today for wrist pain. There was evidence of what is known as pseudogout.  Pain: Take 400 mg of ibuprofen every 6 hours for the next 3 days. After this time, this medication may be used as needed for pain. Take this medication with food to avoid upset stomach.  Tylenol: Should you continue to have additional pain while taking the ibuprofen, you may add in tylenol as needed. Your daily total maximum amount of tylenol from all sources should be limited to 4000mg /day for persons without liver problems, or 2000mg /day for those with liver problems. Vicodin: May use the Vicodin as needed for severe pain. Please note vicodin can increase fall risk, so use with caution.  Ice: May apply ice to the area over the next 24 hours for 15 minutes at a time to reduce swelling. Elevation: Keep the extremity elevated as often as possible to reduce pain and inflammation. Support: Wear the wrist splint for support and comfort. Wear this until pain resolves.   Follow up: Please follow up with the hand specialist on this matter. Call to make an appointment.

## 2017-01-09 NOTE — ED Provider Notes (Signed)
Medical screening examination/treatment/procedure(s) were conducted as a shared visit with non-physician practitioner(s) and myself.  I personally evaluated the patient during the encounter.  83 yo F w/ h/o gout but only in ankles, present to ER for left wrist pain, swelling, ttp and warmth. Has never had gout in her wrist that she knows of. He does have recent UTI she finished Cipro about 45 days ago but other recent illnesses.. My exam her left wrist as minimal range of motion and significant area of erythema, warmth and tenderness around it. Pulses intact. Distal perfusion is intact. With her recent illness and her age there is some suspicion for infectious arthritis versus the more likely inflammatory arthritis.. I agree with joint aspiration and workup otherwise.   Cleo Santucci, Corene Cornea, MD 01/10/17 [phone]

## 2017-01-09 NOTE — ED Provider Notes (Signed)
[facility] DEPT Provider Note   CSN: [id number] Arrival date & time: 01/09/17  1016     History   Chief Complaint Chief Complaint  Patient presents with  . Joint Swelling    HPI Katie Alvarado is a 83 y.o. female.  Patient with History of gout on Allopurinol, diabetes -- presents with 3-4 days of acute onset, worsening left wrist pain and swelling. Patient has severe pain with movement of the wrist. Pain initially began in the shoulder but this has since resolved. No fevers, nausea, vomiting, diarrhea. Patient saw outside physician today and sent her to the emergency department for concern of joint infection.       Past Medical History:  Diagnosis Date  . Acute nasopharyngitis (common cold)   . Arthritis   . Bronchiectasis with acute exacerbation ([place])   . Cancer ([place])    skin cancer  . COPD (chronic obstructive pulmonary disease) ([place])    sees dr. Annamaria Boots   . Cough   . Diabetes mellitus    fasting 130-150  . Dizziness   . Esophageal reflux   . Hypertension   . Insomnia, unspecified   . Other symptoms involving nervous and musculoskeletal systems(781.99)   . Pneumonia    hx of  . Shortness of breath    exertion    Patient Active Problem List   Diagnosis Date Noted  . Allergic urticaria 03/05/2016  . Gastritis 03/05/2016  . Chronic rhinitis 01/09/2016  . Pruritus 09/05/2015  . 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 Anemia 07/18/2015  . Bronchiectasis without complication ([place]) 82/42/3536  . Renal artery stenosis ([place]) 04/25/2015  . Essential hypertension 04/25/2015  . Lower extremity pain, bilateral 08/04/2012  . Diarrhea 08/03/2012  . Klebsiella infection 08/01/2012  . DM type 2 (diabetes mellitus, type 2) ([place]) 07/29/2012  . Generalized weakness 07/29/2012  . Weakness 04/11/2011  . Hyponatremia 04/11/2011  . UTI (lower urinary tract infection) 04/11/2011  . ARF (acute renal failure) ([place]) 04/11/2011  . Leucocytosis 04/11/2011  .  Dehydration 04/11/2011  . Hypotension 04/11/2011  . BRONCHIECTASIS, + [facility] 03/25/2010  . DIZZINESS 02/02/2010  . MUSCULOSKELETAL PAIN 09/25/2007  . INSOMNIA 05/20/2007  . ACUTE NASOPHARYNGITIS 04/20/2007  . Bronchiectasis with acute exacerbation ([place]) 04/20/2007  . Esophageal reflux 04/20/2007    Past Surgical History:  Procedure Laterality Date  . ABDOMINAL HYSTERECTOMY    . ANKLE FRACTURE SURGERY Left   . APPENDECTOMY    . BASAL CELL CARCINOMA EXCISION     NOSE  . CATARACT EXTRACTION, BILATERAL     OUT PATIENT  . COLON SURGERY     colon resection for diverticulitis  . LUMBAR LAMINECTOMY/DECOMPRESSION MICRODISCECTOMY N/A 02/15/2013   Procedure: LUMBAR LAMINECTOMY/DECOMPRESSION MICRODISCECTOMY LUMBAR FOUR-FIVE;  Surgeon: Otilio Connors, MD;  Location: [facility];  Service: Neurosurgery;  Laterality: N/A;  . VARICOSE VEIN SURGERY      OB History    No data available       Home Medications    Prior to Admission medications   Medication Sig Start Date End Date Taking? Authorizing Provider  acetaminophen (TYLENOL) 325 MG tablet Take 2 tablets (650 mg total) by mouth every 6 (six) hours as needed for pain. Patient taking differently: Take 325 mg by mouth every 6 (six) hours as needed for pain.  08/05/12  Yes Eugenie Filler, MD  allopurinol (ZYLOPRIM) 100 MG tablet Take 1 tablet by mouth daily. 06/07/13  Yes [provider]  ALPRAZolam Duanne Moron) 0.25 MG tablet  Take 0.25 mg by mouth 2 (two) times daily as needed for sleep or anxiety. For anxiety.   Yes [provider]  amLODipine (NORVASC) 2.5 MG tablet Take 2.5 mg by mouth daily.  12/19/15  Yes [provider]  Artificial Tear Ointment (DRY EYES OP) Apply 1 drop to eye 2 (two) times daily as needed (for dry eyes).    Yes [provider]  budesonide (PULMICORT) 0.25 MG/2ML nebulizer solution Take 2 mLs (0.25 mg total) by nebulization daily. 10/31/14  Yes Young, Tarri Fuller D, MD  cholecalciferol  (VITAMIN D) 1000 UNITS tablet Take 2,000 Units by mouth every morning.    Yes [provider]  Cinnamon 500 MG capsule Take 500 mg by mouth every morning.    Yes [provider]  esomeprazole (NEXIUM) 40 MG capsule Take 40 mg by mouth every morning.    Yes [provider]  ipratropium (ATROVENT) 0.02 % nebulizer solution Take 2.5 mLs (500 mcg total) by nebulization 4 (four) times daily. 11/01/14  Yes Young, Tarri Fuller D, MD  losartan (COZAAR) 100 MG tablet Take 100 mg by mouth daily.  09/20/15  Yes [provider]  meclizine (ANTIVERT) 25 MG tablet Take 25 mg by mouth every 6 (six) hours as needed for dizziness.  03/14/15  Yes [provider]  metFORMIN (GLUCOPHAGE) 500 MG tablet Take 1,000 mg by mouth 2 (two) times daily.    Yes [provider]  Omega-3 Fatty Acids (FISH OIL) 1000 MG CAPS Take 1 capsule by mouth daily.   Yes [provider]  PROAIR HFA 108 (90 Base) MCG/ACT inhaler INHALE 2 PUFFS INTO THE LUNGS EVERY 6 HOURS AS NEEDED FOR WHEEZING OR SHORTNESS OF BREATH 11/14/16  Yes Young, Tarri Fuller D, MD  promethazine-codeine (PHENERGAN WITH CODEINE) 6.25-10 MG/5ML syrup TAKE 5MLS BY MOUTH EVERY 6 HOURS AS NEEDED FOR COUGH 12/20/16  Yes Baird Lyons D, MD  rifabutin [facility]) 150 MG capsule Take 2 capsules (300 mg total) by mouth every Monday, Wednesday, and Friday. With dinner 09/05/15  Yes Comer, Okey Regal, MD  traMADol (ULTRAM) 50 MG tablet Take 25 mg by mouth every 6 (six) hours as needed for moderate pain.    Yes [provider]  vitamin E 400 UNIT capsule Take 400 Units by mouth daily.   Yes [provider]  amoxicillin (AMOXIL) 500 MG tablet Take 1 tablet (500 mg total) by mouth 2 (two) times daily. Patient not taking: Reported on 01/09/2017 12/20/16   Baird Lyons D, MD  benzonatate (TESSALON) 100 MG capsule TAKE ONE CAPSULE BY MOUTH EVERY 8 HOURS AS NEEDED Patient not taking: Reported on 01/09/2017 10/24/16   Deneise Lever, MD  benzonatate (TESSALON) 200 MG capsule Take 1 capsule (200 mg total) by mouth 3 (three) times daily as needed for cough. Patient not taking: Reported on 01/09/2017 09/19/16   Deneise Lever, MD  levocetirizine (XYZAL) 5 MG tablet Take 1 tablet (5 mg total) by mouth daily as needed for allergies. Patient not taking: Reported on 12/20/2016 03/05/16   Adelina Mings, MD  ondansetron [facility]) 4 MG tablet Take 1 tablet (4 mg total) by mouth every Monday, Wednesday, and Friday. With breakfast Patient not taking: Reported on 01/09/2017 09/05/15   Thayer Headings, MD  ONE TOUCH ULTRA TEST test strip USE TO CHECK BLOOD SUGAR ONCE DAILY 90 01/04/16   [provider]  ONETOUCH DELICA LANCETS 86V MISC USE TO CHECK BLOOD SUGAR ONCE DAILY AS DIRECTED 01/04/16  [provider]  Respiratory Therapy Supplies (FLUTTER) DEVI Use as directed 11/03/15   Deneise Lever, MD    Family History Family History  Problem Relation Age of Onset  . Chronic bronchitis Father   . Other Sister        heart trouble  . Other Brother        heart trouble    Social History Social History  Substance Use Topics  . Smoking status: Never Smoker  . Smokeless tobacco: Never Used  . Alcohol use Yes     Comment: "Hardly Ever"      Allergies   Welchol [colesevelam hcl]; Glucosamine; Tramadol; Celecoxib; and Statins   Review of Systems Review of Systems  Constitutional: Negative for activity change and fever.  HENT: Negative for rhinorrhea and sore throat.   Eyes: Negative for redness.  Respiratory: Negative for cough.   Cardiovascular: Negative for chest pain.  Gastrointestinal: Negative for abdominal pain, diarrhea, nausea and vomiting.  Genitourinary: Negative for dysuria.  Musculoskeletal: Positive for arthralgias. Negative for back pain, joint swelling, myalgias and neck pain.  Skin: Negative for rash and wound.  Neurological: Negative for weakness, numbness and headaches.      Physical Exam Updated Vital Signs BP (!) 155/88   Pulse 87   Temp 98.6 F (37 C) (Oral)   Resp 16   Ht 5\' 8"  (1.727 m)   Wt 67.6 kg (149 lb)   SpO2 95%   BMI 22.66 kg/m   Physical Exam  Constitutional: She appears well-developed and well-nourished.  HENT:  Head: Normocephalic and atraumatic.  Eyes: Pupils are equal, round, and reactive to light. Conjunctivae are normal.  Neck: Normal range of motion. Neck supple.  Cardiovascular: Normal pulses.  Exam reveals no decreased pulses.   Pulmonary/Chest: No respiratory distress.  Musculoskeletal: She exhibits edema and tenderness.  Patient was greatly reduced range of motion of the left wrist due to pain. Patient has erythema and swelling overlying the left wrist joint with tenderness to palpation over the area. No abscess.  Neurological: She is alert. No sensory deficit.  Sensation and vascular distal to the injury is fully intact.   Skin: Skin is warm and dry.  Psychiatric: She has a normal mood and affect.  Nursing note and vitals reviewed.    ED Treatments / Results  Labs (all labs ordered are listed, but only abnormal results are displayed) Labs Reviewed  COMPREHENSIVE METABOLIC PANEL - Abnormal; Notable for the following:       Result Value   Sodium 131 (*)    Chloride 93 (*)    Glucose, Bld 130 (*)    ALT 12 (*)    All other components within normal limits  CBC WITH DIFFERENTIAL/PLATELET - Abnormal; Notable for the following:    Hemoglobin 11.0 (*)    HCT 32.8 (*)    Monocytes Absolute 1.1 (*)    All other components within normal limits  BODY FLUID CULTURE  SYNOVIAL CELL COUNT + DIFF, W/ CRYSTALS    EKG  EKG Interpretation None       Radiology Dg Wrist Complete Left  Result Date: 01/09/2017 CLINICAL DATA:  Wrist pain. EXAM: LEFT WRIST - COMPLETE 3+ VIEW COMPARISON:  LUMBAR FOR: COMPARISON:  LUMBAR FOR Bilateral hand x-rays dated May 08, 2011. FINDINGS: No acute fracture or malalignment. Focal  erosive changes at the triscaphe joint with somewhat well-defined sclerotic margins. Mild degenerative changes of the first [facility] joint. Mild soft tissue swelling about the wrist. The  bones are diffusely osteopenic. IMPRESSION: 1. Focal erosive changes at the triscaphe joint, nonspecific, but can be seen in the setting of inflammatory or infectious arthropathy. Consider further evaluation with MRI. 2. No fracture. Electronically Signed   By: Titus Dubin M.D.   On: 01/09/2017 15:38    Procedures .Joint Aspiration/Arthrocentesis Date/Time: 01/09/2017 3:30 PM Performed by: Carlisle Cater Authorized by: Carlisle Cater   Consent:    Consent obtained:  Verbal and written   Consent given by:  Patient   Risks discussed:  Bleeding, infection, pain and incomplete drainage   Alternatives discussed:  No treatment Location:    Location:  Wrist   Wrist:  L radiocarpal Anesthesia (see MAR for exact dosages):    Anesthesia method:  Local infiltration   Local anesthetic:  Lidocaine 2% w/o epi Procedure details:    Preparation: Patient was prepped and draped in usual sterile fashion     Needle gauge:  31 G   Ultrasound guidance: no     Approach:  Posterior   Aspirate amount:  1cc   Aspirate characteristics:  Blood-tinged, cloudy and yellow   Specimen collected: yes   Post-procedure details:    Dressing:  Adhesive bandage   Patient tolerance of procedure:  Tolerated well, no immediate complications   (including critical care time)  Medications Ordered in ED Medications  lidocaine (XYLOCAINE) 2 % injection 10 mL (not administered)  oxyCODONE-acetaminophen (PERCOCET/ROXICET) 5-325 MG per tablet 0.5 tablet (0.5 tablets Oral Given 01/09/17 1453)     Initial Impression / Assessment and Plan / ED Course  I have reviewed the triage vital signs and the nursing notes.  Pertinent labs & imaging results that were available during my care of the patient were reviewed by me and considered in my medical  decision making (see chart for details).  Clinical Course as of Jan 09 1638  Thu Jan 09, 2017  1414   [facility]: 28.2 [JK]    Clinical Course User Index [JK] Dorie Rank, MD   Patient seen and examined.   Vital signs reviewed and are as follows: BP (!) 155/88   Pulse 87   Temp 98.6 F (37 C) (Oral)   Resp 16   Ht 5\' 8"  (1.727 m)   Wt 67.6 kg (149 lb)   SpO2 95%   BMI 22.66 kg/m   Patient discussed with Dr. Dayna Barker. X-ray performed. Discussed joint aspiration procedure with patient, discussed risks and benefits, patient agrees to proceed.   Performed as above.   Handoff to Anna PA-C: Follow-up on joint fluid studies. Contact ortho as needed.   Pt updated. Velcro splint applied for comfort.    Final Clinical Impressions(s) / ED Diagnoses   Final diagnoses:  Wrist pain    New Prescriptions New Prescriptions   No medications on file     Carlisle Cater, Hershal Coria 01/09/17 1654    Mesner, Corene Cornea, MD 01/10/17 [phone]

## 2017-01-09 NOTE — ED Provider Notes (Signed)
Katie Alvarado is a 83 y.o. female, with a history of DM, HTN, and gout, presenting to the ED with left wrist pain, swelling, and redness. Upon my initial interview, patient's pain is well-controlled.   HPI from [place], PA-C: "Patient with History of gout on Allopurinol, diabetes -- presents with 3-4 days of acute onset, worsening left wrist pain and swelling. Patient has severe pain with movement of the wrist. Pain initially began in the shoulder but this has since resolved. No fevers, nausea, vomiting, diarrhea. Patient saw outside physician today and sent her to the emergency department for concern of joint infection."  Past Medical History:  Diagnosis Date  . Acute nasopharyngitis (common cold)   . Arthritis   . Bronchiectasis with acute exacerbation ([place])   . Cancer ([place])    skin cancer  . COPD (chronic obstructive pulmonary disease) ([place])    sees dr. Annamaria Boots   . Cough   . Diabetes mellitus    fasting 130-150  . Dizziness   . Esophageal reflux   . Hypertension   . Insomnia, unspecified   . Other symptoms involving nervous and musculoskeletal systems(781.99)   . Pneumonia    hx of  . Shortness of breath    exertion    Physical Exam  BP 136/67 (BP Location: Right Arm)   Pulse 88   Temp 98.6 F (37 C) (Oral)   Resp 20   Ht 5\' 8"  (1.727 m)   Wt 67.6 kg (149 lb)   SpO2 97%   BMI 22.66 kg/m   Physical Exam  Constitutional: She appears well-developed and well-nourished. No distress.  HENT:  Head: Normocephalic and atraumatic.  Eyes: Conjunctivae are normal.  Neck: Neck supple.  Cardiovascular: Normal rate, regular rhythm and intact distal pulses.   Pulmonary/Chest: Effort normal. No respiratory distress.  Abdominal: There is no guarding.  Musculoskeletal: She exhibits edema and tenderness. She exhibits no deformity.  Enderness, swelling, and erythema to the volar left wrist. Range of motion limited by pain. Full range of motion in the fingers of the left hand.   Lymphadenopathy:    She has no cervical adenopathy.  Neurological: She is alert.  No noted sensory deficits in the left hand. Grip strength equal bilaterally.  Skin: Skin is warm and dry. Capillary refill takes less than 2 seconds. She is not diaphoretic. There is erythema.  Psychiatric: She has a normal mood and affect. Her behavior is normal.  Nursing note and vitals reviewed.   ED Course  Procedures   Results for orders placed or performed during the hospital encounter of 01/09/17  Body fluid culture  Result Value Ref Range   Specimen Description      FLUID LEFT WRIST Performed at [facility], [address], [place], [place] [address]    Special Requests NONE    Gram Stain      NO ORGANISMS SEEN ABUNDANT WBC PRESENT,BOTH PMN AND MONONUCLEAR Gram Stain Report Called to,Read Back By and Verified With: J.TALKINGTON RN AT 0254 ON 01/09/17 BY S.VANHOORNE    Culture PENDING    Report Status PENDING   Comprehensive metabolic panel  Result Value Ref Range   Sodium 131 (L) 135 - 145 mmol/L   Potassium 4.6 3.5 - 5.1 mmol/L   Chloride 93 (L) 101 - 111 mmol/L   CO2 27 22 - 32 mmol/L   Glucose, Bld 130 (H) 65 - 99 mg/dL   BUN 17 6 - 20 mg/dL   Creatinine, Ser 0.78  0.44 - 1.00 mg/dL   Calcium 9.4 8.9 - 10.3 mg/dL   Total Protein 7.0 6.5 - 8.1 g/dL   Albumin 4.1 3.5 - 5.0 g/dL   AST 17 15 - 41 U/L   ALT 12 (L) 14 - 54 U/L   Alkaline Phosphatase 55 38 - 126 U/L   Total Bilirubin 0.8 0.3 - 1.2 mg/dL   GFR calc non Af Amer >60 >60 mL/min   GFR calc Af Amer >60 >60 mL/min   Anion gap 11 5 - 15  CBC with Differential  Result Value Ref Range   WBC 9.4 4.0 - 10.5 K/uL   RBC 3.90 3.87 - 5.11 MIL/uL   Hemoglobin 11.0 (L) 12.0 - 15.0 g/dL   HCT 32.8 (L) 36.0 - 46.0 %   MCV 84.1 78.0 - 100.0 fL   MCH 28.2 26.0 - 34.0 pg   MCHC 33.5 30.0 - 36.0 g/dL   RDW 13.6 11.5 - 15.5 %   Platelets 278 150 - 400 K/uL   Neutrophils Relative % 71 %   Neutro Abs 6.7 1.7 - 7.7 K/uL    Lymphocytes Relative 16 %   Lymphs Abs 1.5 0.7 - 4.0 K/uL   Monocytes Relative 12 %   Monocytes Absolute 1.1 (H) 0.1 - 1.0 K/uL   Eosinophils Relative 1 %   Eosinophils Absolute 0.1 0.0 - 0.7 K/uL   Basophils Relative 0 %   Basophils Absolute 0.0 0.0 - 0.1 K/uL  Synovial cell count + diff, w/ crystals  Result Value Ref Range   Color, Synovial RED (A) YELLOW   Appearance-Synovial TURBID (A) CLEAR   Crystals, Fluid INTRACELLULAR CALCIUM PYROPHOSPHATE CRYSTALS    WBC, Synovial 38,500 (H) 0 - 200 /cu mm   Neutrophil, Synovial 93 (H) 0 - 25 %   Monocyte-Macrophage-Synovial Fluid 7 (L) 50 - 90 %   Dg Wrist Complete Left  Result Date: 01/09/2017 CLINICAL DATA:  Wrist pain. EXAM: LEFT WRIST - COMPLETE 3+ VIEW COMPARISON:  LUMBAR FOR: COMPARISON:  LUMBAR FOR Bilateral hand x-rays dated May 08, 2011. FINDINGS: No acute fracture or malalignment. Focal erosive changes at the triscaphe joint with somewhat well-defined sclerotic margins. Mild degenerative changes of the first [facility] joint. Mild soft tissue swelling about the wrist. The bones are diffusely osteopenic. IMPRESSION: 1. Focal erosive changes at the triscaphe joint, nonspecific, but can be seen in the setting of inflammatory or infectious arthropathy. Consider further evaluation with MRI. 2. No fracture. Electronically Signed   By: Titus Dubin M.D.   On: 01/09/2017 15:38    MDM   Clinical Course as of Jan 10 2335  Thu Jan 09, 2017  1414   [facility]: 28.2 [JK]  1640 Took patient care handoff report from [place], [place]. Plan: review arthrocentesis lab results and treat accordingly.  [SJ]    Clinical Course User Index [JK] Dorie Rank, MD [SJ] Lorayne Bender, PA-C    Patient presents with left wrist pain and swelling. Arthrocentesis fluid analysis shows calcium pyrophosphate crystals, indicating pseudogout. States she can take ibuprofen without any issues. Hand surgery follow-up as needed for intra-articular steroid injections, as  needed. The patient was given instructions for home care as well as return precautions. Patient voices understanding of these instructions, accepts the plan, and is comfortable with discharge.   Findings and plan of care discussed with Merrily Pew, MD. Dr. Dayna Barker personally evaluated and examined this patient.    Vitals:   01/09/17 1053 01/09/17 1456 01/09/17 1727  BP:  134/76 (!) 155/88 136/67  Pulse: 84 87 88  Resp: 18 16 20   Temp: 98.6 F (37 C)  98.6 F (37 C)  TempSrc: Oral  Oral  SpO2: 95% 95% 97%  Weight: 67.6 kg (149 lb)    Height: 5\' 8"  (1.727 m)        Layla Maw 01/09/17 2336    Mesner, Corene Cornea, MD 01/10/17 0050

## 2017-01-13 DIAGNOSIS — M11232 Other chondrocalcinosis, left wrist: Secondary | ICD-10-CM | POA: Diagnosis not present

## 2017-01-13 LAB — BODY FLUID CULTURE
Culture: NO GROWTH
GRAM STAIN: NONE SEEN

## 2017-01-24 DIAGNOSIS — D509 Iron deficiency anemia, unspecified: Secondary | ICD-10-CM | POA: Diagnosis not present

## 2017-01-24 DIAGNOSIS — E119 Type 2 diabetes mellitus without complications: Secondary | ICD-10-CM | POA: Diagnosis not present

## 2017-01-24 DIAGNOSIS — E1165 Type 2 diabetes mellitus with hyperglycemia: Secondary | ICD-10-CM | POA: Diagnosis not present

## 2017-01-24 DIAGNOSIS — D649 Anemia, unspecified: Secondary | ICD-10-CM | POA: Diagnosis not present

## 2017-01-24 DIAGNOSIS — M1120 Other chondrocalcinosis, unspecified site: Secondary | ICD-10-CM | POA: Diagnosis not present

## 2017-02-03 ENCOUNTER — Ambulatory Visit: Payer: Medicare Other | Admitting: Internal Medicine

## 2017-02-04 ENCOUNTER — Telehealth: Payer: Self-pay | Admitting: Internal Medicine

## 2017-02-04 DIAGNOSIS — J479 Bronchiectasis, uncomplicated: Secondary | ICD-10-CM

## 2017-02-04 NOTE — Telephone Encounter (Signed)
Called and spoke with pt and she stated that her nebulizer machine stopped working and she needs this order sent to [facility] in [place] ASAP.  CY please advise if you are ok with [place] placing this order.  Thanks

## 2017-02-04 NOTE — Telephone Encounter (Signed)
Order has been placed as urgent for the replacement of her nebulizer.

## 2017-02-04 NOTE — Telephone Encounter (Signed)
Yes- ok to replace compressor nebulizer for dx bronchiectasis

## 2017-02-05 ENCOUNTER — Encounter: Payer: Self-pay | Admitting: Internal Medicine

## 2017-02-05 ENCOUNTER — Ambulatory Visit (INDEPENDENT_AMBULATORY_CARE_PROVIDER_SITE_OTHER): Payer: Medicare Other | Admitting: Internal Medicine

## 2017-02-05 DIAGNOSIS — J479 Bronchiectasis, uncomplicated: Secondary | ICD-10-CM

## 2017-02-05 DIAGNOSIS — K219 Gastro-esophageal reflux disease without esophagitis: Secondary | ICD-10-CM | POA: Diagnosis not present

## 2017-02-05 MED ORDER — PROMETHAZINE-CODEINE 6.25-10 MG/5ML PO SYRP: ORAL_SOLUTION | ORAL | 0 refills | Status: DC

## 2017-02-05 NOTE — Progress Notes (Signed)
HPI F never smoker, followed for bronchiectasis/ chronic bronchitis/ MAIC, complicated by hx of GERD, DM , arthritis, aortic atherosclerosis Sputum Cx 06/09/12 POS MAIC  Started 07/14/12- Biaxin 500mg  TIW, EMB 1200 TIW, Rifabutin 300 TIW  ended- 08/12/12 due to weakness and malaise,  CT chest 06/01/12- Progressive bilateral nodular bronchiectasis Office spirometry  01/18/13- moderate obstructive airways disease-FVC 2.10/69%, FEV1 1.25/58%,  FEV1/FVC  0.59/ 84%,  FEF 25-75% 0.47/33% --------------------------------------------------------------------------------  12/20/16- 83 year old female never smoker followed for bronchiectasis/chronic bronchitis/MAIC, complicated by history GERD, DM, arthritis,hyponatremia, aortic atherosclerosis Follows for: bronchiectasis, MAIC , pt reports cough is getting worse, clear mucus Chronic cough is wearing her down and may be somewhat worse over the summer without distinct acute exacerbation. Cough is often productive of white sputum but she thinks it is mostly triggered by postnasal drainage. Denies headache. Had seen Dr. Ernesto Rutherford 2 months ago with unknown antibiotic but can't remember if that helped. She does have codeine cough syrup which helps if needed, mainly at bedtime to avoid sedation. She says Perles are useless for her and she has been intolerant of tramadol. Tussionex was too strong. Vest is not helping as much to clear her airways and reduce cough as we had hoped. CT chest 09/24/16 IMPRESSION: 1. 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 No significant progression of disease is noted on today's examination. 2. Aortic atherosclerosis, in addition to left main and 3 vessel coronary artery disease. Please note that although the presence of coronary artery calcium documents the presence of coronary artery disease, the severity of  this disease and any potential stenosis cannot be assessed on this non-gated CT examination. Assessment for potential risk factor modification, dietary therapy or pharmacologic therapy may be warranted, if clinically indicated.   02/05/17-  83 year old female never smoker followed for bronchiectasis/chronic bronchitis/MAIC, complicated by history GERD, DM, arthritis,hyponatremia, aortic atherosclerosis ---bronchiectasis, still coughing with clear mucus, denies dyspnea Was treated for gout in the wrist with steroid injection and colchicine. Using nebulizer 3 or 4 times daily.  Using pneumatic vest daily-cough not productive. Exhausting cough especially when she is trying to sleep-requests cough syrup refill-discussed with age and safety emphasized.  ROS-see HPI + = positive Constitutional:     No-weight loss, night sweats, no-fevers, chills,  +fatigue, lassitude. HEENT:   No-  headaches, difficulty swallowing, +tooth/dental problems, no-sore throat,       No-  sneezing, itching, ear ache, nasal congestion, post nasal drip, + declining eyesight CV:  No-   chest pain, orthopnea, PND, swelling in lower extremities, anasarca,  dizziness, palpitations Resp: +Mild  shortness of breath with exertion or at rest.             +productive cough,  + non-productive cough,  No- coughing up of blood.              No-   change in color of mucus.  No- wheezing.   Skin: No-   rash or lesions. GI:  No-   heartburn, indigestion, abdominal pain, nausea, vomiting,  GU:  MS:  +Active low back and bilateral hip pain Neuro-     complaint of generalized weakness without myalgias Psych:  No- change in mood or affect. No depression or anxiety.  No memory loss.  Obj- Physical exam General- Alert, Oriented, Affect-appropriate, Distress- none acute.+ Always looks well/neatly groomed. Skin- rash-none,  excoriation- none Lymphadenopathy- none Head- atraumatic  Eyes- + decreased vision            Ears-  Hearing, canals-normal            Nose- Clear, no-Septal dev, mucus, polyps, erosion, perforation             Throat- Mallampati II , mucosa very dry , drainage- none, tonsils- atrophic Neck- flexible , trachea midline, no stridor , thyroid nl, carotid no bruit Chest - symmetrical excursion , unlabored           Heart/CV- RRR , no murmur , no gallop  , no rub, nl s1 s2                           - JVD- none , edema- none, stasis changes- none, varices- none           Lung-   Cough + deep/rattling, + rhonchi right lateral base, unlabored, no wheeze ,                                    dullness-none, rub-none.  Chest wall-  Abd-  Br/ Gen/ Rectal- Not done, not indicated Extrem-   No-clubbing, none, atrophy- none, strength- nl for age. + Cool/dusky feet Neuro- grossly intact to observation

## 2017-02-05 NOTE — Patient Instructions (Addendum)
Script phoned in to refill cough syrup   Please call if we can help

## 2017-02-06 ENCOUNTER — Telehealth: Payer: Self-pay | Admitting: Internal Medicine

## 2017-02-06 NOTE — Telephone Encounter (Signed)
Called pharmacy to cancel and called in Rx to [facility] in [place]. Pt understood. Nothing further is needed,.

## 2017-02-13 DIAGNOSIS — J320 Chronic maxillary sinusitis: Secondary | ICD-10-CM | POA: Diagnosis not present

## 2017-02-13 DIAGNOSIS — J338 Other polyp of sinus: Secondary | ICD-10-CM | POA: Diagnosis not present

## 2017-02-13 DIAGNOSIS — J322 Chronic ethmoidal sinusitis: Secondary | ICD-10-CM | POA: Diagnosis not present

## 2017-02-13 DIAGNOSIS — J040 Acute laryngitis: Secondary | ICD-10-CM | POA: Diagnosis not present

## 2017-02-13 DIAGNOSIS — J301 Allergic rhinitis due to pollen: Secondary | ICD-10-CM | POA: Diagnosis not present

## 2017-02-18 DIAGNOSIS — D649 Anemia, unspecified: Secondary | ICD-10-CM | POA: Diagnosis not present

## 2017-02-19 DIAGNOSIS — J3489 Other specified disorders of nose and nasal sinuses: Secondary | ICD-10-CM | POA: Diagnosis not present

## 2017-02-19 DIAGNOSIS — J320 Chronic maxillary sinusitis: Secondary | ICD-10-CM | POA: Diagnosis not present

## 2017-02-19 DIAGNOSIS — J322 Chronic ethmoidal sinusitis: Secondary | ICD-10-CM | POA: Diagnosis not present

## 2017-03-05 DIAGNOSIS — M5411 Radiculopathy, occipito-atlanto-axial region: Secondary | ICD-10-CM | POA: Diagnosis not present

## 2017-03-05 DIAGNOSIS — M9901 Segmental and somatic dysfunction of cervical region: Secondary | ICD-10-CM | POA: Diagnosis not present

## 2017-03-10 DIAGNOSIS — M5411 Radiculopathy, occipito-atlanto-axial region: Secondary | ICD-10-CM | POA: Diagnosis not present

## 2017-03-10 DIAGNOSIS — M9901 Segmental and somatic dysfunction of cervical region: Secondary | ICD-10-CM | POA: Diagnosis not present

## 2017-03-14 ENCOUNTER — Other Ambulatory Visit: Payer: Self-pay | Admitting: Internal Medicine

## 2017-03-14 NOTE — Telephone Encounter (Signed)
Refill request sent on 11/23. Last prescription sent on 10/17 with no refills. Sent for 273mL.  Last OV 10/17. Next OV 08/07/17.    Dr. Annamaria Boots please advise.   Allergies  Allergen Reactions  . Welchol [Colesevelam Hcl] Hives  . Glucosamine Other (See Comments)    Unknown  . Tramadol Nausea Only  . Celecoxib Hives, Itching and Rash  . Statins Rash   Current Outpatient Medications on File Prior to Visit  Medication Sig Dispense Refill  . acetaminophen (TYLENOL) 325 MG tablet Take 2 tablets (650 mg total) by mouth every 6 (six) hours as needed for pain. (Patient taking differently: Take 325 mg by mouth every 6 (six) hours as needed for pain. )    . allopurinol (ZYLOPRIM) 100 MG tablet Take 1 tablet by mouth daily.    Marland Kitchen ALPRAZolam (XANAX) 0.25 MG tablet Take 0.25 mg by mouth 2 (two) times daily as needed for sleep or anxiety. For anxiety.    Marland Kitchen amLODipine (NORVASC) 2.5 MG tablet Take 2.5 mg by mouth daily.     . Artificial Tear Ointment (DRY EYES OP) Apply 1 drop to eye 2 (two) times daily as needed (for dry eyes).     . budesonide (PULMICORT) 0.25 MG/2ML nebulizer solution Take 2 mLs (0.25 mg total) by nebulization daily. 60 mL 12  . cholecalciferol (VITAMIN D) 1000 UNITS tablet Take 2,000 Units by mouth every morning.     . Cinnamon 500 MG capsule Take 500 mg by mouth every morning.     Marland Kitchen esomeprazole (NEXIUM) 40 MG capsule Take 40 mg by mouth every morning.     Marland Kitchen HYDROcodone-acetaminophen (NORCO/VICODIN) 5-325 MG tablet Take 0.5 tablets by mouth every 4 (four) hours as needed for severe pain. (Patient not taking: Reported on 02/05/2017) 6 tablet 0  . ipratropium (ATROVENT) 0.02 % nebulizer solution Take 2.5 mLs (500 mcg total) by nebulization 4 (four) times daily. 360 mL 12  . levocetirizine (XYZAL) 5 MG tablet Take 1 tablet (5 mg total) by mouth daily as needed for allergies. 30 tablet 5  . losartan (COZAAR) 100 MG tablet Take 100 mg by mouth daily.     . meclizine (ANTIVERT) 25 MG tablet  Take 25 mg by mouth every 6 (six) hours as needed for dizziness.     . metFORMIN (GLUCOPHAGE) 500 MG tablet Take 1,000 mg by mouth 2 (two) times daily.     . Omega-3 Fatty Acids (FISH OIL) 1000 MG CAPS Take 1 capsule by mouth daily.    . ondansetron (ZOFRAN) 4 MG tablet Take 1 tablet (4 mg total) by mouth every Monday, Wednesday, and Friday. With breakfast 12 tablet 6  . ONE TOUCH ULTRA TEST test strip USE TO CHECK BLOOD SUGAR ONCE DAILY 90  5  . ONETOUCH DELICA LANCETS 25D MISC USE TO CHECK BLOOD SUGAR ONCE DAILY AS DIRECTED  5  . PROAIR HFA 108 (90 Base) MCG/ACT inhaler INHALE 2 PUFFS INTO THE LUNGS EVERY 6 HOURS AS NEEDED FOR WHEEZING OR SHORTNESS OF BREATH 8.5 Inhaler 1  . promethazine-codeine (PHENERGAN WITH CODEINE) 6.25-10 MG/5ML syrup TAKE 5MLS BY MOUTH EVERY 6 HOURS AS NEEDED FOR COUGH 200 mL 0  . Respiratory Therapy Supplies (FLUTTER) DEVI Use as directed 1 each 0  . rifabutin (MYCOBUTIN) 150 MG capsule Take 2 capsules (300 mg total) by mouth every Monday, Wednesday, and Friday. With dinner 24 capsule 5  . traMADol (ULTRAM) 50 MG tablet Take 25 mg by mouth every 6 (six) hours as needed  for moderate pain.     . vitamin E 400 UNIT capsule Take 400 Units by mouth daily.     No current facility-administered medications on file prior to visit.

## 2017-03-16 NOTE — Telephone Encounter (Signed)
Ok to refill 

## 2017-03-23 NOTE — Assessment & Plan Note (Signed)
Continue to emphasize reflux precautions-discussed again

## 2017-03-23 NOTE — Assessment & Plan Note (Signed)
Nebulizer, pneumatic vest- exhausting cough is socially embarrassing but stable times years. -Refill cough syrup with careful discussion continue use of vest and nebulizer machine

## 2017-04-08 DIAGNOSIS — N3001 Acute cystitis with hematuria: Secondary | ICD-10-CM | POA: Diagnosis not present

## 2017-04-08 DIAGNOSIS — R300 Dysuria: Secondary | ICD-10-CM | POA: Diagnosis not present

## 2017-04-08 DIAGNOSIS — Z79899 Other long term (current) drug therapy: Secondary | ICD-10-CM | POA: Diagnosis not present

## 2017-04-08 DIAGNOSIS — R5383 Other fatigue: Secondary | ICD-10-CM | POA: Diagnosis not present

## 2017-04-09 DIAGNOSIS — M5411 Radiculopathy, occipito-atlanto-axial region: Secondary | ICD-10-CM | POA: Diagnosis not present

## 2017-04-09 DIAGNOSIS — M9901 Segmental and somatic dysfunction of cervical region: Secondary | ICD-10-CM | POA: Diagnosis not present

## 2017-04-21 DIAGNOSIS — R6884 Jaw pain: Secondary | ICD-10-CM | POA: Diagnosis not present

## 2017-05-30 IMAGING — CT CT L SPINE W/ CM
1 of 6 series · 6 of 14 positions shown, 8 images · non-contrast
Comparison: Preoperative exam 12/07/2012

CLINICAL DATA: Pain in the right low back, hip and leg, worsened
since the previous evaluation and interval surgery.
TECHNIQUE: Contiguous axial images were obtained through the Lumbar spine after
the intrathecal infusion of infusion. Coronal and sagittal
reconstructions were obtained of the axial image sets.

[Series 3: l spine 3.0 b41s · axial · 0.27mm/px · z∈[-320,-156]mm · 6 of 79 slices shown, 8 images]
[im 12/79  soft-tissue]
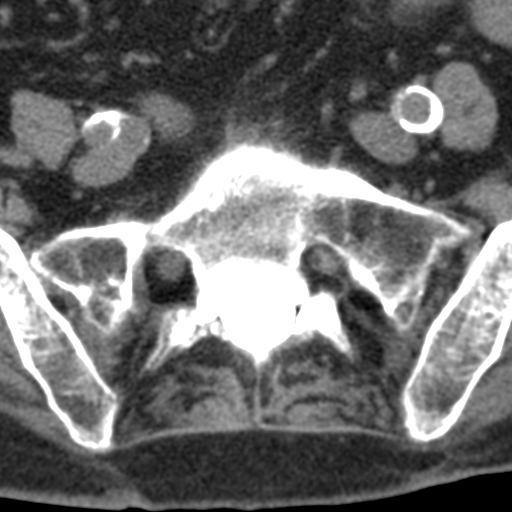
[im 12/79  bone]
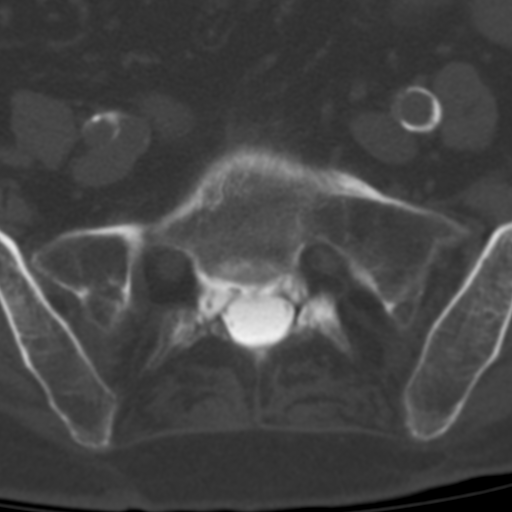
[im 23/79  bone]
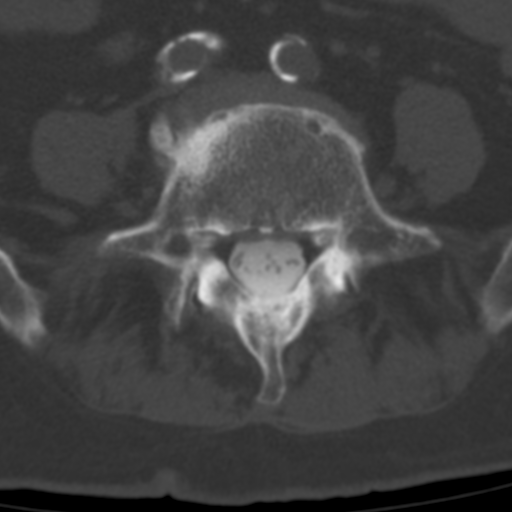
[im 34/79  bone]
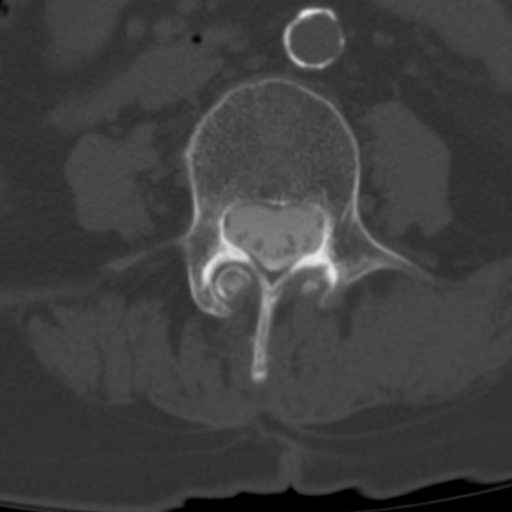
[im 45/79  bone]
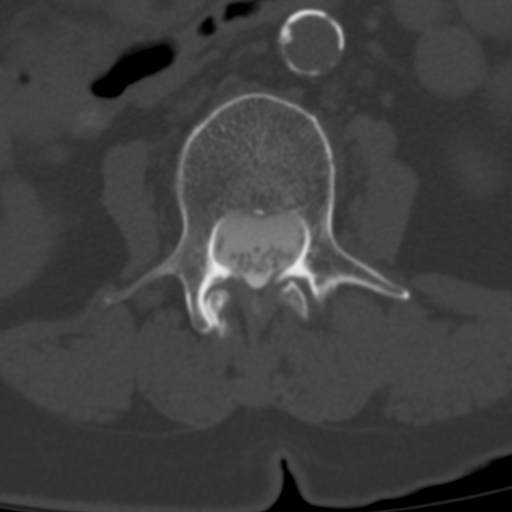
[im 56/79  soft-tissue]
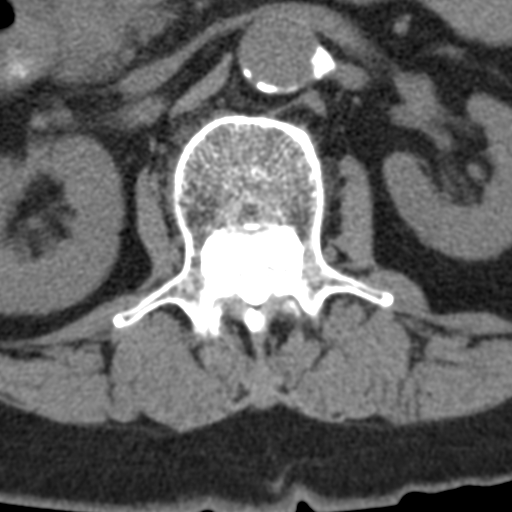
[im 56/79  bone]
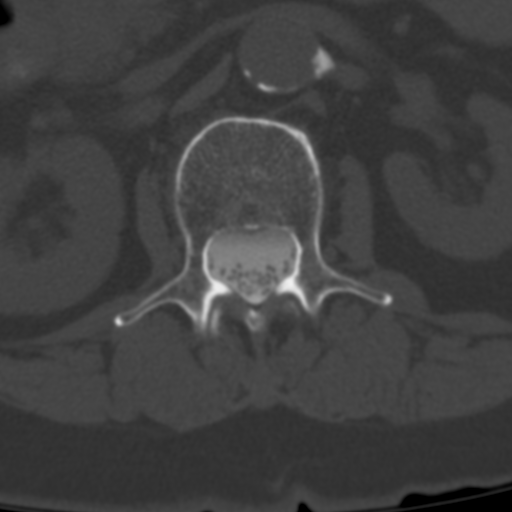
[im 67/79  bone]
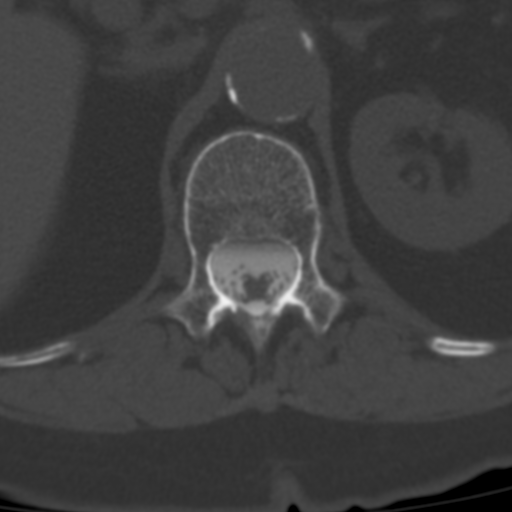

[6 of 14 positions shown; findings below may reference images not displayed]

EXAM:
LUMBAR MYELOGRAM

FLUOROSCOPY TIME:  0 minutes 56 seconds.  Seven images.

PROCEDURE:
After thorough discussion of risks and benefits of the procedure
including bleeding, infection, injury to nerves, blood vessels,
adjacent structures as well as headache and CSF leak, written and
oral informed consent was obtained. Consent was obtained by Dr. Fakkel
Ayne. Time out form was completed.

Patient was positioned prone on the fluoroscopy table. Local
anesthesia was provided with 1% lidocaine without epinephrine after
prepped and draped in the usual sterile fashion. Puncture was
performed at L3-4 using a 3 1/2 inch 22-gauge spinal needle via
right approach. Using a single pass through the dura, the needle was
placed within the thecal sac, with return of clear CSF. 15 mL of
Tmnipaque-KPD was injected into the thecal sac, with normal
opacification of the nerve roots and cauda equina consistent with
free flow within the subarachnoid space.

I personally performed the lumbar puncture and administered the
intrathecal contrast. I also personally performed acquisition of the
myelogram images.
FINDINGS: LUMBAR MYELOGRAM FINDINGS:

There are anterior extradural defects from L1-2 through L5-S1.
Anterolisthesis at L4-5 of 4 mm again noted. Stenosis of the lateral
recesses at L4-5 could be associated with neural compression on
either or both sides. With standing flexion extension,
anterolisthesis worsens to 1 cm.

CT LUMBAR MYELOGRAM FINDINGS:

T12-L1:  Normal interspace.  Conus tip at lower L1.

L1-2: Circumferential bulging of the disc. Mild narrowing of the
lateral recesses without neural compression.

L2-3: Circumferential protrusion of the disc. Mild facet and
ligamentous hypertrophy. Stenosis of both lateral recesses, left
more than right, without definite neural compression. Slight
worsening since the previous study.

L3-4: Circumferential protrusion of the disc more prominent towards
the right. Mild stenosis of the lateral recesses right more than
left. No change since the previous study.

L4-5: Previous posterior decompression. Anterolisthesis of 5 mm in
this position, known to worsen with standing flexion.
Circumferential protrusion of disc material. Stenosis of both
lateral recesses. Foraminal stenosis right worse than left because
of osteophyte in disc material. Neural compression is likely at this
level, particularly on the right within the foramen.

L5-S1: Chronic disc degeneration with loss of disc height. Endplate
osteophytes and bulging of the disc. Mild narrowing of the
subarticular lateral recesses but no apparent neural compression.
Foraminal encroachment on the right by osteophyte and disc material
that could affect the exiting right L5 nerve root. Similar
appearance to the previous study.
IMPRESSION: Disc bulges and protrusions at L1-2, L2-3 and L3-4, similar to the
previous study. No definite neural compression in that region.

L4-5: Interval posterior decompression. Anterolisthesis of 4-5 mm
that worsens to 1 cm with standing flexion. Stenosis of both
subarticular lateral recesses and neural foramina, right worse than
left. This appearance would worsen with standing flexion. Neural
compression is quite likely at this level, particularly in the right
lateral recess and intervertebral foramen on the right.

L5-S1: Chronic disc degeneration. Foraminal narrowing on the right
because of osteophyte in disc material could possibly affect the
right L5 nerve root. Similar appearance to the previous study.

## 2017-06-02 ENCOUNTER — Other Ambulatory Visit: Payer: Self-pay | Admitting: Internal Medicine

## 2017-06-19 DIAGNOSIS — Z8739 Personal history of other diseases of the musculoskeletal system and connective tissue: Secondary | ICD-10-CM | POA: Diagnosis not present

## 2017-06-19 DIAGNOSIS — E1149 Type 2 diabetes mellitus with other diabetic neurological complication: Secondary | ICD-10-CM | POA: Diagnosis not present

## 2017-06-19 DIAGNOSIS — E118 Type 2 diabetes mellitus with unspecified complications: Secondary | ICD-10-CM | POA: Diagnosis not present

## 2017-06-19 DIAGNOSIS — R809 Proteinuria, unspecified: Secondary | ICD-10-CM | POA: Diagnosis not present

## 2017-06-19 DIAGNOSIS — E785 Hyperlipidemia, unspecified: Secondary | ICD-10-CM | POA: Diagnosis not present

## 2017-06-19 DIAGNOSIS — Z7984 Long term (current) use of oral hypoglycemic drugs: Secondary | ICD-10-CM | POA: Diagnosis not present

## 2017-06-19 DIAGNOSIS — J479 Bronchiectasis, uncomplicated: Secondary | ICD-10-CM | POA: Diagnosis not present

## 2017-06-19 DIAGNOSIS — I10 Essential (primary) hypertension: Secondary | ICD-10-CM | POA: Diagnosis not present

## 2017-06-19 DIAGNOSIS — D509 Iron deficiency anemia, unspecified: Secondary | ICD-10-CM | POA: Diagnosis not present

## 2017-06-24 ENCOUNTER — Telehealth: Payer: Self-pay | Admitting: Internal Medicine

## 2017-06-24 ENCOUNTER — Ambulatory Visit (INDEPENDENT_AMBULATORY_CARE_PROVIDER_SITE_OTHER)
Admission: RE | Admit: 2017-06-24 | Discharge: 2017-06-24 | Disposition: A | Discharge status: Home / Self Care | Payer: Medicare Other | Source: Ambulatory Visit | Attending: Internal Medicine | Admitting: Internal Medicine

## 2017-06-24 DIAGNOSIS — H903 Sensorineural hearing loss, bilateral: Secondary | ICD-10-CM | POA: Diagnosis not present

## 2017-06-24 DIAGNOSIS — J479 Bronchiectasis, uncomplicated: Secondary | ICD-10-CM

## 2017-06-24 DIAGNOSIS — H9312 Tinnitus, left ear: Secondary | ICD-10-CM | POA: Diagnosis not present

## 2017-06-24 DIAGNOSIS — J322 Chronic ethmoidal sinusitis: Secondary | ICD-10-CM | POA: Diagnosis not present

## 2017-06-24 DIAGNOSIS — H8143 Vertigo of central origin, bilateral: Secondary | ICD-10-CM | POA: Diagnosis not present

## 2017-06-24 DIAGNOSIS — R079 Chest pain, unspecified: Secondary | ICD-10-CM | POA: Diagnosis not present

## 2017-06-24 DIAGNOSIS — J320 Chronic maxillary sinusitis: Secondary | ICD-10-CM | POA: Diagnosis not present

## 2017-06-24 DIAGNOSIS — J410 Simple chronic bronchitis: Secondary | ICD-10-CM | POA: Diagnosis not present

## 2017-06-24 NOTE — Telephone Encounter (Signed)
Spoke with pt. She states Dr. Minna Merritts recommended she have a CXR today for her bronchiectasis, however no order is available in Epic but pt did have a paper script stating the need for a CXR. Pt is requesting to put the order under Dr. Janee Morn name, pt currently sees CY for bronchiectasis. Spoke with Joellen Jersey and was advised to place CXR order under CY's name. Order placed. Pt aware to keep her upcoming appt with CY in 07/2017 and to call for any issues. Nothing further needed.

## 2017-07-02 ENCOUNTER — Other Ambulatory Visit: Payer: Self-pay | Admitting: Internal Medicine

## 2017-07-02 DIAGNOSIS — H8143 Vertigo of central origin, bilateral: Secondary | ICD-10-CM | POA: Diagnosis not present

## 2017-07-02 DIAGNOSIS — H903 Sensorineural hearing loss, bilateral: Secondary | ICD-10-CM | POA: Diagnosis not present

## 2017-07-21 ENCOUNTER — Other Ambulatory Visit: Payer: Self-pay | Admitting: Internal Medicine

## 2017-08-07 ENCOUNTER — Ambulatory Visit (INDEPENDENT_AMBULATORY_CARE_PROVIDER_SITE_OTHER): Payer: Medicare Other | Admitting: Internal Medicine

## 2017-08-07 ENCOUNTER — Encounter: Payer: Self-pay | Admitting: Internal Medicine

## 2017-08-07 VITALS — BP 122/68 | HR 82 | Ht 68.0 in | Wt 148.4 lb

## 2017-08-07 DIAGNOSIS — J310 Chronic rhinitis: Secondary | ICD-10-CM

## 2017-08-07 DIAGNOSIS — J302 Other seasonal allergic rhinitis: Secondary | ICD-10-CM

## 2017-08-07 DIAGNOSIS — J479 Bronchiectasis, uncomplicated: Secondary | ICD-10-CM

## 2017-08-07 DIAGNOSIS — J3089 Other allergic rhinitis: Secondary | ICD-10-CM

## 2017-08-07 MED ORDER — PHENYLEPHRINE HCL 1 % NA SOLN
3.0000 [drp] | Freq: Once | NASAL | Status: AC
  Administered 2017-08-07: 3 [drp] via NASAL

## 2017-08-07 MED ORDER — METHYLPREDNISOLONE ACETATE 80 MG/ML IJ SUSP
80.0000 mg | Freq: Once | INTRAMUSCULAR | Status: AC
  Administered 2017-08-07: 80 mg via INTRAMUSCULAR

## 2017-08-07 NOTE — Patient Instructions (Signed)
Order- nasal neb neo     Dx seasonal and perennial allergic rhinitis              Depo 80  Keep using your VEST to help with your airways.  Please call if we can help

## 2017-08-07 NOTE — Assessment & Plan Note (Signed)
Nasal congestion now with eustachian dysfunction/head stopped up with some unsteadiness probably reflect current pollen season. Plan-nasal Neo-Synephrine inhalation treatment, Depo-Medrol

## 2017-08-07 NOTE — Assessment & Plan Note (Signed)
Chronic pattern unchanged with latest imaging.  Always has a disruptive cough which fortunately is not worse.

## 2017-08-07 NOTE — Progress Notes (Signed)
HPI F never smoker, followed for bronchiectasis/ chronic bronchitis/ MAIC, complicated by hx of GERD, DM , arthritis, aortic atherosclerosis Sputum Cx 06/09/12 POS MAIC  Started 07/14/12- Biaxin 500mg  TIW, EMB 1200 TIW, Rifabutin 300 TIW  ended- 08/12/12 due to weakness and malaise,  CT chest 06/01/12- Progressive bilateral nodular bronchiectasis Office spirometry  01/18/13- moderate obstructive airways disease-FVC 2.10/69%, FEV1 1.25/58%,  FEV1/FVC  0.59/ 84%,  FEF 25-75% 0.47/33% --------------------------------------------------------------------------------  02/05/17-  84 year old female never smoker followed for bronchiectasis/chronic bronchitis/MAIC, complicated by history GERD, DM, arthritis,hyponatremia, aortic atherosclerosis ---bronchiectasis, still coughing with clear mucus, denies dyspnea Was treated for gout in the wrist with steroid injection and colchicine. Using nebulizer 3 or 4 times daily.  Using pneumatic vest daily-cough not productive. Exhausting cough especially when she is trying to sleep-requests cough syrup refill-discussed with age and safety emphasized.  08/07/17- 84 year old female never smoker followed for bronchiectasis/chronic bronchitis/MAIC, complicated by history GERD, DM, arthritis,hyponatremia, aortic atherosclerosis ----Bronchiectasis:Pt has noticed increased SOB with movement.  She does not get around much anymore-eyesight is limiting.  Stable chronic cough only produces clear mucus.  Some recent seasonal nasal congestion with head stopped up this week affecting her equilibrium.  Antibiotic helped a month or more ago for chest congestion.  Continues to use her pneumatic vest and finds it helpful. CXR 06/24/17- Stable bronchitic changes and patchy RIGHT upper, LEFT lung base opacities corresponding to bronchiectasis and infiltrates on prior CT, acute component not excluded.  ROS-see HPI + = positive Constitutional:     No-weight loss, night sweats, no-fevers,  chills,  +fatigue, lassitude. HEENT:   No-  headaches, difficulty swallowing, +tooth/dental problems, no-sore throat,       No-  sneezing, itching, ear ache, +nasal congestion, post nasal drip, + declining eyesight CV:  No-   chest pain, orthopnea, PND, swelling in lower extremities, anasarca,  dizziness, palpitations Resp: +Mild  shortness of breath with exertion or at rest.             +productive cough,  + non-productive cough,  No- coughing up of blood.              No-   change in color of mucus.  No- wheezing.   Skin: No-   rash or lesions. GI:  No-   heartburn, indigestion, abdominal pain, nausea, vomiting,  GU:  MS:  +Active low back and bilateral hip pain Neuro-     complaint of generalized weakness without myalgias Psych:  No- change in mood or affect. No depression or anxiety.  No memory loss.  Obj- Physical exam General- Alert, Oriented, Affect-appropriate, Distress- none acute.+ Always looks well/neatly groomed. Skin- rash-none,  excoriation- none Lymphadenopathy- none Head- atraumatic            Eyes- + decreased vision            Ears- Hearing, canals-normal            Nose- Clear, no-Septal dev, mucus, polyps, erosion, perforation             Throat- Mallampati II , mucosa very dry , drainage- none, tonsils- atrophic Neck- flexible , trachea midline, no stridor , thyroid nl, carotid no bruit Chest - symmetrical excursion , unlabored           Heart/CV- RRR , no murmur , no gallop  , no rub, nl s1 s2                           -  JVD- none , edema- none, stasis changes- none, varices- none           Lung-   Cough-none, + rhonchi right lateral base and left upper., unlabored, no wheeze ,                                    dullness-none, rub-none.  Chest wall-  Abd-  Br/ Gen/ Rectal- Not done, not indicated Extrem-   No-clubbing, none, atrophy- none, strength- nl for age. + Cool/dusky feet Neuro- grossly intact to observation

## 2017-08-18 DIAGNOSIS — C4402 Squamous cell carcinoma of skin of lip: Secondary | ICD-10-CM | POA: Diagnosis not present

## 2017-08-18 DIAGNOSIS — L570 Actinic keratosis: Secondary | ICD-10-CM | POA: Diagnosis not present

## 2017-08-18 DIAGNOSIS — Z85828 Personal history of other malignant neoplasm of skin: Secondary | ICD-10-CM | POA: Diagnosis not present

## 2017-08-18 DIAGNOSIS — D485 Neoplasm of uncertain behavior of skin: Secondary | ICD-10-CM | POA: Diagnosis not present

## 2017-08-18 DIAGNOSIS — D0462 Carcinoma in situ of skin of left upper limb, including shoulder: Secondary | ICD-10-CM | POA: Diagnosis not present

## 2017-08-18 DIAGNOSIS — D0439 Carcinoma in situ of skin of other parts of face: Secondary | ICD-10-CM | POA: Diagnosis not present

## 2017-09-02 DIAGNOSIS — I10 Essential (primary) hypertension: Secondary | ICD-10-CM | POA: Diagnosis not present

## 2017-09-02 DIAGNOSIS — D509 Iron deficiency anemia, unspecified: Secondary | ICD-10-CM | POA: Diagnosis not present

## 2017-09-03 DIAGNOSIS — C4402 Squamous cell carcinoma of skin of lip: Secondary | ICD-10-CM | POA: Diagnosis not present

## 2017-09-03 DIAGNOSIS — D0462 Carcinoma in situ of skin of left upper limb, including shoulder: Secondary | ICD-10-CM | POA: Diagnosis not present

## 2017-09-03 DIAGNOSIS — D0439 Carcinoma in situ of skin of other parts of face: Secondary | ICD-10-CM | POA: Diagnosis not present

## 2017-09-30 DIAGNOSIS — L570 Actinic keratosis: Secondary | ICD-10-CM | POA: Diagnosis not present

## 2017-09-30 DIAGNOSIS — D485 Neoplasm of uncertain behavior of skin: Secondary | ICD-10-CM | POA: Diagnosis not present

## 2017-09-30 DIAGNOSIS — C4402 Squamous cell carcinoma of skin of lip: Secondary | ICD-10-CM | POA: Diagnosis not present

## 2017-10-03 ENCOUNTER — Encounter: Payer: Self-pay | Admitting: Radiation Oncology

## 2017-10-13 NOTE — Progress Notes (Signed)
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   Patient presented with symptoms of: She had a "bump" come up in March/April 2019 and presented to her dentist. He was not concerned and Katie Alvarado then presented to her dermatologist and was sent to Dr. Link Snuffer.   Biopsies of mid lower lip revealed: Invasive squamous cell carcinoma.   Nutrition Status Yes No Comments  Weight changes? [x]  []  She has gone down one pants size per her report.  Swallowing concerns? []  [x]  She does have difficulty manipulating food past her lip. She needs to drink liquids with a straw.   PEG? []  [x]     Referrals Yes No Comments  Social Work? []  [x]    Dentistry? []  [x]    Swallowing therapy? []  [x]    Nutrition? []  [x]    Med/Onc? []  [x]     Safety Issues Yes No Comments  Prior radiation? []  [x]    Pacemaker/ICD? []  [x]    Possible current pregnancy? []  [x]    Is the patient on methotrexate? []  [x]     Tobacco/Marijuana/Snuff/ETOH use: She has never smoked and does not drink alcohol.   Past/Anticipated interventions by otolaryngology, if any:  N/A  Past/Anticipated interventions by medical oncology, if any:  None scheduled.    Current Complaints / other details:   Dr. Link Snuffer (skin surgery center, [facility]) Note faxed 10/03/17.  I treated with Mohs surgery one month ago for an invasive squamous cell carcinoma. I was able to clear the tumor and closed the wound with a lip wedge, but during Mohs surgery identified a focus of large nerve perineural invasion (0.3 mm nerve diameter). She has now healed well, and I discussed with her the evidence favoring adjuvant radiation therapy for these high risk perineural squamous cell carcinomas.   BP (!) 151/75 (BP Location: Right Arm, Patient Position: Sitting, Cuff Size: Normal)   Pulse 77   Temp 97.9 F (36.6 C) (Oral)   Resp 20   Ht 5\' 9"  (1.753 m)   Wt 149 lb 3.2 oz (67.7  kg)   SpO2 99%   BMI 22.03 kg/m    Wt Readings from Last 3 Encounters:  10/15/17 149 lb 3.2 oz (67.7 kg)  08/07/17 148 lb 6.4 oz (67.3 kg)  02/05/17 148 lb 3.2 oz (67.2 kg)

## 2017-10-14 ENCOUNTER — Telehealth: Payer: Self-pay | Admitting: *Deleted

## 2017-10-14 NOTE — Telephone Encounter (Signed)
Oncology Nurse Navigator Documentation  Placed introductory call to new referral patient.  Introduced myself as the H&N oncology nurse navigator that works with Dr. Isidore Moos to whom she has been referred by Dr. Link Snuffer.  She confirmed understanding of referral and tomorrow's 0930 appt.    I briefly explained my role as her navigator, indicated I would be joining her during her appt next week.  I confirmed understanding of [place] location, explained arrival and Rad[facility]c registration process for appt.  She verbalized understanding of information provided, expressed appreciation for my call.  Gayleen Orem, RN, BSN Head & Neck Oncology Nurse [place] [phone]

## 2017-10-15 ENCOUNTER — Ambulatory Visit
Admission: RE | Admit: 2017-10-15 | Discharge: 2017-10-15 | Disposition: A | Discharge status: Home / Self Care | Payer: Medicare Other | Source: Ambulatory Visit | Attending: Radiation Oncology | Admitting: Radiation Oncology

## 2017-10-15 ENCOUNTER — Encounter: Payer: Self-pay | Admitting: Radiation Oncology

## 2017-10-15 ENCOUNTER — Encounter: Payer: Self-pay | Admitting: *Deleted

## 2017-10-15 ENCOUNTER — Other Ambulatory Visit: Payer: Self-pay

## 2017-10-15 DIAGNOSIS — C001 Malignant neoplasm of external lower lip: Secondary | ICD-10-CM | POA: Diagnosis not present

## 2017-10-15 DIAGNOSIS — Z8701 Personal history of pneumonia (recurrent): Secondary | ICD-10-CM | POA: Insufficient documentation

## 2017-10-15 DIAGNOSIS — R0602 Shortness of breath: Secondary | ICD-10-CM | POA: Diagnosis not present

## 2017-10-15 DIAGNOSIS — I10 Essential (primary) hypertension: Secondary | ICD-10-CM | POA: Diagnosis not present

## 2017-10-15 DIAGNOSIS — J00 Acute nasopharyngitis [common cold]: Secondary | ICD-10-CM | POA: Diagnosis not present

## 2017-10-15 DIAGNOSIS — Z85828 Personal history of other malignant neoplasm of skin: Secondary | ICD-10-CM | POA: Insufficient documentation

## 2017-10-15 DIAGNOSIS — Z888 Allergy status to other drugs, medicaments and biological substances status: Secondary | ICD-10-CM | POA: Diagnosis not present

## 2017-10-15 DIAGNOSIS — K219 Gastro-esophageal reflux disease without esophagitis: Secondary | ICD-10-CM | POA: Diagnosis not present

## 2017-10-15 DIAGNOSIS — Z9889 Other specified postprocedural states: Secondary | ICD-10-CM | POA: Insufficient documentation

## 2017-10-15 DIAGNOSIS — E119 Type 2 diabetes mellitus without complications: Secondary | ICD-10-CM | POA: Diagnosis not present

## 2017-10-15 DIAGNOSIS — Z7984 Long term (current) use of oral hypoglycemic drugs: Secondary | ICD-10-CM | POA: Insufficient documentation

## 2017-10-15 DIAGNOSIS — Z79899 Other long term (current) drug therapy: Secondary | ICD-10-CM | POA: Diagnosis not present

## 2017-10-15 DIAGNOSIS — J449 Chronic obstructive pulmonary disease, unspecified: Secondary | ICD-10-CM | POA: Insufficient documentation

## 2017-10-15 NOTE — Progress Notes (Signed)
Radiation Oncology         [phone] ________________________________  Initial outpatient Consultation  Name: Katie Alvarado MRN: [id number]  Date: 10/15/2017  DOB: 12/02/1925  VV:OHYWVP, Lennette Bihari, MD  Janann August, MD   REFERRING PHYSICIAN: Janann August, MD  DIAGNOSIS:    ICD-10-CM   1. 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 female who presented with a "bump" that appeared on mid lower lip in March or April of 2019. Her dermatologist referred her to Dr. Link Snuffer for Mohs surgery which was performed on 09/03/17. He was able to clear the tumor, but during the procedure he noticed additional perineural invasion with maximum diameter of involved nerve being 0.3 mm.  I have not received the pathology report for her second biopsy but the patient reports she was told this was benign. Dr. Link Snuffer has referred her to me to discuss adjuvant radiation therapy given high risk nature of the perineural invasion.  Swallowing issues, if any: Endorses difficulty manipulating food past her lip. She needs to drink liquids with a straw.  Weight Changes: Endorses going down one pant size.  Pain status: She endorses soreness at biopsy site on her lip.  Tobacco history, if any: Denies.  ETOH abuse, if any: Denies.  Prior cancers, if any: Denies.  PREVIOUS RADIATION THERAPY: No  PAST MEDICAL HISTORY:  has a past medical history of Acute nasopharyngitis (common cold), Arthritis, Bronchiectasis with acute exacerbation ([place]), Cancer ([place]), COPD (chronic obstructive pulmonary disease) ([place]), Cough, Diabetes mellitus, Dizziness,  Esophageal reflux, Hypertension, Insomnia, unspecified, Other symptoms involving nervous and musculoskeletal systems(781.99), Pneumonia, and Shortness of breath.    PAST SURGICAL HISTORY: Past Surgical History:  Procedure Laterality Date  . ABDOMINAL HYSTERECTOMY    . ANKLE FRACTURE SURGERY Left   . APPENDECTOMY    . BASAL CELL CARCINOMA EXCISION     NOSE  . CATARACT EXTRACTION, BILATERAL     OUT PATIENT  . COLON SURGERY     colon resection for diverticulitis  . LUMBAR LAMINECTOMY/DECOMPRESSION MICRODISCECTOMY N/A 02/15/2013   Procedure: LUMBAR LAMINECTOMY/DECOMPRESSION MICRODISCECTOMY LUMBAR FOUR-FIVE;  Surgeon: Otilio Connors, MD;  Location: [facility];  Service: Neurosurgery;  Laterality: N/A;  . VARICOSE VEIN SURGERY      FAMILY HISTORY: family history includes Chronic bronchitis in her father; Other in her brother and sister.  SOCIAL HISTORY:  reports that she has never smoked. She has never used smokeless tobacco. She reports that she drinks alcohol. She reports that she does not use drugs.  ALLERGIES: Welchol [colesevelam hcl]; Glucosamine; Tramadol; Celecoxib; and Statins  MEDICATIONS:  Current Outpatient Medications  Medication Sig Dispense Refill  . acetaminophen (TYLENOL) 325 MG tablet Take 2 tablets (650 mg total) by mouth every 6 (six) hours as needed for pain. (Patient taking differently: Take 325 mg by mouth every 6 (six) hours as needed for pain. )    . allopurinol (ZYLOPRIM) 100 MG tablet Take 1 tablet by mouth daily.    Marland Kitchen ALPRAZolam (XANAX) 0.25 MG tablet Take 0.25 mg by mouth 2 (two) times daily as needed for sleep or anxiety. For anxiety.    Marland Kitchen  amLODipine (NORVASC) 2.5 MG tablet Take 2.5 mg by mouth daily.     . Artificial Tear Ointment (DRY EYES OP) Apply 1 drop to eye 2 (two) times daily as needed (for dry eyes).     . budesonide (PULMICORT) 0.25 MG/2ML nebulizer solution Take 2 mLs (0.25 mg total) by nebulization daily. 60 mL 12  . cholecalciferol (VITAMIN D)  1000 UNITS tablet Take 2,000 Units by mouth every morning.     . Cinnamon 500 MG capsule Take 500 mg by mouth every morning.     Marland Kitchen esomeprazole (NEXIUM) 40 MG capsule Take 40 mg by mouth every morning.     Marland Kitchen ipratropium (ATROVENT) 0.02 % nebulizer solution Take 2.5 mLs (500 mcg total) by nebulization 4 (four) times daily. 360 mL 12  . levocetirizine (XYZAL) 5 MG tablet Take 1 tablet (5 mg total) by mouth daily as needed for allergies. 30 tablet 5  . losartan (COZAAR) 100 MG tablet Take 100 mg by mouth daily.     . meclizine (ANTIVERT) 25 MG tablet Take 25 mg by mouth every 6 (six) hours as needed for dizziness.     . metFORMIN (GLUCOPHAGE) 500 MG tablet Take 1,000 mg by mouth 2 (two) times daily.     . Omega-3 Fatty Acids (FISH OIL) 1000 MG CAPS Take 1 capsule by mouth daily.    . ONE TOUCH ULTRA TEST test strip USE TO CHECK BLOOD SUGAR ONCE DAILY 90  5  . ONETOUCH DELICA LANCETS 69C MISC USE TO CHECK BLOOD SUGAR ONCE DAILY AS DIRECTED  5  . PROAIR HFA 108 (90 Base) MCG/ACT inhaler INHALE 2 PUFFS INTO THE LUNGS EVERY 6 HOURS AS NEEDED FOR WHEEZING OR SHORTNESS OF BREATH 8.5 Inhaler 3  . Respiratory Therapy Supplies (FLUTTER) DEVI Use as directed 1 each 0  . traMADol (ULTRAM) 50 MG tablet Take 25 mg by mouth every 6 (six) hours as needed for moderate pain.     . vitamin E 400 UNIT capsule Take 400 Units by mouth daily.    . ondansetron (ZOFRAN) 4 MG tablet Take 1 tablet (4 mg total) by mouth every Monday, Wednesday, and Friday. With breakfast (Patient not taking: Reported on 10/15/2017) 12 tablet 6   No current facility-administered medications for this encounter.     REVIEW OF SYSTEMS:  Notable for that above.   PHYSICAL EXAM:  height is 5\' 9"  (1.753 m) and weight is 149 lb 3.2 oz (67.7 kg). Her oral temperature is 97.9 F (36.6 C). Her blood pressure is 151/75 (abnormal) and her pulse is 77. Her respiration is 20 and oxygen saturation is 99%.   General: Alert and oriented, in no acute  distress HEENT: Head is normocephalic. Extraocular movements are intact. Oropharynx is notable for slightly dry mucous membranes. No lesions in the throat or the mouth  She has a scar just right of midline over lower lip that is about 1.5 cm in dimension and is healed very well. Thickening along scar of lower lip but no sign of recurrence.  Neck: Neck is notable for no palpable masses. Heart: Regular in rate and rhythm with no murmurs, rubs, or gallops. Chest: Clear to auscultation bilaterally, with no rhonchi, wheezes, or rales. Abdomen: Soft, nontender, nondistended, with no rigidity or guarding. Extremities: No cyanosis or edema. Lymphatics: see Neck Exam Skin: No concerning lesions. Musculoskeletal: symmetric strength and muscle tone throughout. Neurologic: Cranial nerves II through XII are grossly intact. No obvious focalities. Speech is fluent. Coordination is intact. Psychiatric:  Judgment and insight are intact. Affect is appropriate.   ECOG = 1  0 - Asymptomatic (Fully active, able to carry on all predisease activities without restriction)  1 - Symptomatic but completely ambulatory (Restricted in physically strenuous activity but ambulatory and able to carry out work of a light or sedentary nature. For example, light housework, office work)  2 - Symptomatic, <50% in bed during the day (Ambulatory and capable of all self care but unable to carry out any work activities. Up and about more than 50% of waking hours)  3 - Symptomatic, >50% in bed, but not bedbound (Capable of only limited self-care, confined to bed or chair 50% or more of waking hours)  4 - Bedbound (Completely disabled. Cannot carry on any self-care. Totally confined to bed or chair)  5 - Death   Eustace Pen MM, Creech RH, Tormey DC, et al. [id number]). "Toxicity and response criteria of the [facility]". [place] Oncol. 5 (6): 649-55   LABORATORY DATA:  Lab Results  Component Value Date   WBC  9.4 01/09/2017   HGB 11.0 (L) 01/09/2017   HCT 32.8 (L) 01/09/2017   MCV 84.1 01/09/2017   PLT 278 01/09/2017   CMP     Component Value Date/Time   NA 131 (L) 01/09/2017 1058   K 4.6 01/09/2017 1058   CL 93 (L) 01/09/2017 1058   CO2 27 01/09/2017 1058   GLUCOSE 130 (H) 01/09/2017 1058   BUN 17 01/09/2017 1058   CREATININE 0.78 01/09/2017 1058   CREATININE 0.88 03/05/2016 1511   CALCIUM 9.4 01/09/2017 1058   PROT 7.0 01/09/2017 1058   ALBUMIN 4.1 01/09/2017 1058   AST 17 01/09/2017 1058   ALT 12 (L) 01/09/2017 1058   ALKPHOS 55 01/09/2017 1058   BILITOT 0.8 01/09/2017 1058   GFRNONAA >60 01/09/2017 1058   GFRAA >60 01/09/2017 1058       RADIOGRAPHY: No results found.    IMPRESSION/PLAN: Lip cancer, PNI  This is a delightful patient with post-op lip cancer. I would recommend adjuvant radiotherapy for this patient due to the degree of  PNI. She is a candidate for 20 treatments over 4 weeks or 10 treatments over 5 weeks. I recommend the 5 week regimen in order to minimize stressors related to transportation and other difficulties for this patient. Will use electron therapy.  We discussed the potential risks, benefits, and side effects of radiotherapy. We talked in detail about acute and late effects. We discussed that some of the most bothersome acute effects may be mucositis, dysgeusia,  skin irritation,  dehydration, weight loss and fatigue. We talked about late effects which include but are not necessarily limited to injury to normal tissues. No guarantees of treatment were given. A consent form was signed and placed in the patient's medical record. The patient is enthusiastic about proceeding with treatment. I look forward to participating in the patient's care.    Simulation (treatment planning) will take place in the next few days  Nutritionist referral recommended but declined by patient today.  I spent over 30 minutes face to face with the patient, over 50% of which was  spent on counseling and care coordination. __________________________________________   Eppie Gibson, MD  This document serves as a record of services personally performed by Eppie Gibson, MD. It was created on his behalf by Linward Natal, a trained medical scribe. The creation of this record is based on the scribe's personal observations and the provider's statements to  them. This document has been checked and approved by the attending provider.

## 2017-10-16 ENCOUNTER — Telehealth: Payer: Self-pay | Admitting: *Deleted

## 2017-10-16 NOTE — Telephone Encounter (Signed)
Oncology Nurse Navigator Documentation  In follow-up to patient's indication during yesterday's consult with Dr. Isidore Moos she had additional biopsy s/p initial, called the Echo to request report.  Report rec'd, submitted to [place] to be scanned.  Dr. Isidore Moos informed.  Gayleen Orem, RN, BSN Head & Neck Oncology Nurse [place] at [place] [phone]

## 2017-10-16 NOTE — Progress Notes (Signed)
Oncology Nurse Navigator Documentation  Met with Katie Alvarado during initial consult with Dr. Isidore Moos.  She was accompanied by 2 friends.   1. Further introduced myself as her Navigator, explained my role as a member of the Care Team.   2. Provided New Patient Information packet, discussed contents:  Contact information for physician(s), myself, other members of the Care Team.  Advance Directive information ([place] blue pamphlet with LCSW contact info), [place] AD booklet.  Fall Prevention Patient Safety Plan  Appointment Guideline  Financial Assistance Information sheet  [place] with highlight of [place] 3. Provided introductory explanation of radiation treatment including SIM planning and purpose of Aquaplast head and shoulder mask, showed them example.   4. I encouraged them to contact me with questions/concerns as treatments/procedures begin.  They verbalized understanding of information provided.    Katie Orem, RN, BSN Head & Neck Oncology Nurse [place] at [place] [phone]

## 2017-10-17 ENCOUNTER — Ambulatory Visit
Admission: RE | Admit: 2017-10-17 | Discharge: 2017-10-17 | Disposition: A | Discharge status: Home / Self Care | Payer: Medicare Other | Source: Ambulatory Visit | Attending: Radiation Oncology | Admitting: Radiation Oncology

## 2017-10-17 ENCOUNTER — Encounter: Payer: Self-pay | Admitting: *Deleted

## 2017-10-17 DIAGNOSIS — C001 Malignant neoplasm of external lower lip: Secondary | ICD-10-CM | POA: Diagnosis not present

## 2017-10-17 NOTE — Progress Notes (Signed)
Oncology Nurse Navigator Documentation  To provide support, encouragement and care continuity, met with Ms. Katie Alvarado during her CT [facility] and Electron Clinical Setup.    She tolerated procedure without difficulty, denied questions/concerns.   I encouraged her to call me prior to 7/5 [facility].  She voiced understanding.   Gayleen Orem, RN, BSN Head & Neck Oncology Nurse [place] at [place] [phone]

## 2017-10-20 NOTE — Progress Notes (Signed)
  Radiation Oncology         [phone]) [phone] ________________________________  Name: Katie Alvarado MRN: [id number]  Date: 10/17/2017  DOB: 09-28-1925  SIMULATION AND TREATMENT PLANNING NOTE  Outpatient  DIAGNOSIS:     ICD-10-CM   1. 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  Identity was confirmed.  All relevant records and images related to the planned course of therapy were reviewed.  The patient freely provided informed written consent to proceed with treatment after reviewing the details related to the planned course of therapy. The consent form was witnessed and verified by the simulation staff.    Then, the patient was set-up in a stable reproducible  supine position for radiation therapy.  A lead block was custom made for her mouth, and a head mask for immobilization.      TREATMENT PLANNING NOTE: Treatment planning then occurred.  The radiation prescription was entered and confirmed.    Electron field was custom made in the [facility] room, to target her lower lip scar with margin.   A total of 3 medically necessary complex treatment devices were fabricated and supervised by me, in the form of custom made electron block, lead block for mouth, and head mask. MORE FIELDS WITH MLCs MAY BE ADDED IN DOSIMETRY for dose homogeneity.  I have requested : electron plan/special port plan.  The patient will receive 44 Gy in 10 fractions (twice weekly) to lower lip tumor bed. Bolus will be placed at each treatment.    -----------------------------------  Eppie Gibson, MD

## 2017-10-24 ENCOUNTER — Ambulatory Visit
Admission: RE | Admit: 2017-10-24 | Discharge: 2017-10-24 | Disposition: A | Discharge status: Home / Self Care | Payer: Medicare Other | Source: Ambulatory Visit | Attending: Radiation Oncology | Admitting: Radiation Oncology

## 2017-10-24 DIAGNOSIS — Z510 Encounter for antineoplastic radiation therapy: Secondary | ICD-10-CM | POA: Diagnosis not present

## 2017-10-24 DIAGNOSIS — C001 Malignant neoplasm of external lower lip: Secondary | ICD-10-CM | POA: Diagnosis not present

## 2017-10-27 ENCOUNTER — Ambulatory Visit
Admission: RE | Admit: 2017-10-27 | Discharge: 2017-10-27 | Disposition: A | Discharge status: Home / Self Care | Payer: Medicare Other | Source: Ambulatory Visit | Attending: Radiation Oncology | Admitting: Radiation Oncology

## 2017-10-27 DIAGNOSIS — C001 Malignant neoplasm of external lower lip: Secondary | ICD-10-CM | POA: Diagnosis not present

## 2017-10-27 DIAGNOSIS — Z510 Encounter for antineoplastic radiation therapy: Secondary | ICD-10-CM | POA: Diagnosis not present

## 2017-10-31 ENCOUNTER — Ambulatory Visit
Admission: RE | Admit: 2017-10-31 | Discharge: 2017-10-31 | Disposition: A | Discharge status: Home / Self Care | Payer: Medicare Other | Source: Ambulatory Visit | Attending: Radiation Oncology | Admitting: Radiation Oncology

## 2017-10-31 ENCOUNTER — Encounter: Payer: Self-pay | Admitting: *Deleted

## 2017-10-31 DIAGNOSIS — Z510 Encounter for antineoplastic radiation therapy: Secondary | ICD-10-CM | POA: Diagnosis not present

## 2017-10-31 DIAGNOSIS — C001 Malignant neoplasm of external lower lip: Secondary | ICD-10-CM | POA: Diagnosis not present

## 2017-10-31 NOTE — Progress Notes (Signed)
Oncology Nurse Navigator Documentation  Met with Ms. Gesner s/p RT today to check on her well-being.  She has completed 3 of 10 tmts for lower lip carcinoma. She indicated tolerating tmts without issue.   Recognized increasing fatigue, we discussed this as an expected tmt SE. She denied needs/concerns. I encouraged her to call me if needed.  She voiced understanding.  Gayleen Orem, RN, BSN Head & Neck Oncology Nurse [place] at [place] [phone]

## 2017-11-03 ENCOUNTER — Ambulatory Visit
Admission: RE | Admit: 2017-11-03 | Discharge: 2017-11-03 | Disposition: A | Discharge status: Home / Self Care | Payer: Medicare Other | Source: Ambulatory Visit | Attending: Radiation Oncology | Admitting: Radiation Oncology

## 2017-11-03 DIAGNOSIS — C001 Malignant neoplasm of external lower lip: Secondary | ICD-10-CM | POA: Diagnosis not present

## 2017-11-03 DIAGNOSIS — Z510 Encounter for antineoplastic radiation therapy: Secondary | ICD-10-CM | POA: Diagnosis not present

## 2017-11-07 ENCOUNTER — Ambulatory Visit: Payer: Medicare Other

## 2017-11-07 ENCOUNTER — Ambulatory Visit
Admission: RE | Admit: 2017-11-07 | Discharge: 2017-11-07 | Disposition: A | Discharge status: Home / Self Care | Payer: Medicare Other | Source: Ambulatory Visit | Attending: Radiation Oncology | Admitting: Radiation Oncology

## 2017-11-07 DIAGNOSIS — C001 Malignant neoplasm of external lower lip: Secondary | ICD-10-CM | POA: Diagnosis not present

## 2017-11-07 DIAGNOSIS — Z510 Encounter for antineoplastic radiation therapy: Secondary | ICD-10-CM | POA: Diagnosis not present

## 2017-11-10 ENCOUNTER — Ambulatory Visit
Admission: RE | Admit: 2017-11-10 | Discharge: 2017-11-10 | Disposition: A | Discharge status: Home / Self Care | Payer: Medicare Other | Source: Ambulatory Visit | Attending: Radiation Oncology | Admitting: Radiation Oncology

## 2017-11-10 DIAGNOSIS — C001 Malignant neoplasm of external lower lip: Secondary | ICD-10-CM | POA: Diagnosis not present

## 2017-11-10 DIAGNOSIS — Z510 Encounter for antineoplastic radiation therapy: Secondary | ICD-10-CM | POA: Diagnosis not present

## 2017-11-10 DIAGNOSIS — R1030 Lower abdominal pain, unspecified: Secondary | ICD-10-CM | POA: Diagnosis not present

## 2017-11-14 ENCOUNTER — Encounter: Payer: Self-pay | Admitting: *Deleted

## 2017-11-14 ENCOUNTER — Telehealth: Payer: Self-pay | Admitting: *Deleted

## 2017-11-14 ENCOUNTER — Ambulatory Visit
Admission: RE | Admit: 2017-11-14 | Discharge: 2017-11-14 | Disposition: A | Discharge status: Home / Self Care | Payer: Medicare Other | Source: Ambulatory Visit | Attending: Radiation Oncology | Admitting: Radiation Oncology

## 2017-11-14 ENCOUNTER — Other Ambulatory Visit: Payer: Self-pay | Admitting: Radiation Oncology

## 2017-11-14 DIAGNOSIS — C001 Malignant neoplasm of external lower lip: Secondary | ICD-10-CM | POA: Diagnosis not present

## 2017-11-14 DIAGNOSIS — Z510 Encounter for antineoplastic radiation therapy: Secondary | ICD-10-CM | POA: Diagnosis not present

## 2017-11-14 MED ORDER — LIDOCAINE VISCOUS HCL 2 % MT SOLN: OROMUCOSAL | 3 refills | Status: DC

## 2017-11-14 NOTE — Telephone Encounter (Signed)
CALLED BARBARA NEFF TO ASK HER TO CALL THIS PATIENT, LVM

## 2017-11-14 NOTE — Progress Notes (Signed)
Oncology Nurse Navigator Documentation  Met with Katie Alvarado in Nursing s/p RT to check on her well-being.  She was accompanied by friend who is providing transportation. She reported:  Extreme soreness inside lower lip (tmt area), displayed RT-induced blister.  I encouraged her to talk with Dr. Isidore Moos about Rx for viscous lidocaine, explained its use.  Increasing fatigue.  I explained may be attributed to RT, encouraged her to rest as much as possible. She acknowledged having 3 remaining tmts. I encouraged her to call me with needs/concerns.  She voiced understanding.  Gayleen Orem, RN, BSN Head & Neck Oncology Nurse [place] at [place] [phone]

## 2017-11-17 ENCOUNTER — Telehealth: Payer: Self-pay | Admitting: *Deleted

## 2017-11-17 ENCOUNTER — Ambulatory Visit
Admission: RE | Admit: 2017-11-17 | Discharge: 2017-11-17 | Disposition: A | Discharge status: Home / Self Care | Payer: Medicare Other | Source: Ambulatory Visit | Attending: Radiation Oncology | Admitting: Radiation Oncology

## 2017-11-17 ENCOUNTER — Telehealth: Payer: Self-pay | Admitting: Radiation Oncology

## 2017-11-17 DIAGNOSIS — C001 Malignant neoplasm of external lower lip: Secondary | ICD-10-CM | POA: Diagnosis not present

## 2017-11-17 DIAGNOSIS — Z510 Encounter for antineoplastic radiation therapy: Secondary | ICD-10-CM | POA: Diagnosis not present

## 2017-11-17 NOTE — Telephone Encounter (Signed)
Called patient to inform that treatments (9 & 10 ) have been sifted to August 12 - arrival time - 1:45 pm to see Dr. Isidore Moos prior to treatment, treatment will be done @ 2:10 pm that day and her treatment on August 16 will be @ 3 pm, lvm for a return call

## 2017-11-17 NOTE — Telephone Encounter (Signed)
Per 7/26 sch msg.  Mailed calendar.

## 2017-11-18 ENCOUNTER — Inpatient Hospital Stay: Payer: Medicare Other | Admitting: Nutrition

## 2017-11-21 ENCOUNTER — Ambulatory Visit: Payer: Medicare Other

## 2017-11-24 ENCOUNTER — Ambulatory Visit: Payer: Medicare Other

## 2017-12-01 ENCOUNTER — Ambulatory Visit
Admission: RE | Admit: 2017-12-01 | Discharge: 2017-12-01 | Disposition: A | Discharge status: Home / Self Care | Payer: Medicare Other | Source: Ambulatory Visit | Attending: Radiation Oncology | Admitting: Radiation Oncology

## 2017-12-01 ENCOUNTER — Inpatient Hospital Stay: Payer: Medicare Other | Attending: Radiation Oncology | Admitting: Nutrition

## 2017-12-01 DIAGNOSIS — C001 Malignant neoplasm of external lower lip: Secondary | ICD-10-CM | POA: Insufficient documentation

## 2017-12-01 NOTE — Progress Notes (Signed)
84 year old female diagnosed with malignant lower lip cancer.  She is a patient of Dr. Isidore Moos receiving radiation therapy.  Past medical history includes hypertension, esophageal reflux, diabetes, and COPD.  Medications include Xanax, vitamin D, cinnamon, and Nexium.  Labs were reviewed.  Height: 5 feet 9 inches. Weight: 149.2 pounds. Usual body weight: 150 pounds. BMI: 22.03.  Patient states she is at her usual body weight. She is eating smaller more frequent meals. She knows to concentrate on protein foods. She denies difficulty chewing or swallowing at this time. Reports she does not drink oral nutrition supplements because her doctor told her it raises her blood sugars.  Nutrition diagnosis:  Food and nutrition related knowledge deficit related to lower lip cancer as evidenced by no prior need for nutrition related information.  Intervention: Patient was educated to continue small frequent meals and snacks with adequate calories and protein. Encouraged patient to maintain current weight. Provided samples of boost glucose control for protein supplement in case patient would like to try this as a snack. Questions were answered.  Teach back method used.  Contact information provided.  Monitoring, evaluation, goals: Patient will tolerate adequate calories and protein for weight maintenance.  No follow-up required.  Patient has my contact information if needed.  **Disclaimer: This note was dictated with voice recognition software. Similar sounding words can inadvertently be transcribed and this note may contain transcription errors which may not have been corrected upon publication of note.**

## 2017-12-05 ENCOUNTER — Ambulatory Visit
Admission: RE | Admit: 2017-12-05 | Discharge: 2017-12-05 | Disposition: A | Discharge status: Home / Self Care | Payer: Medicare Other | Source: Ambulatory Visit | Attending: Radiation Oncology | Admitting: Radiation Oncology

## 2017-12-05 ENCOUNTER — Encounter: Payer: Self-pay | Admitting: *Deleted

## 2017-12-05 DIAGNOSIS — C001 Malignant neoplasm of external lower lip: Secondary | ICD-10-CM | POA: Diagnosis not present

## 2017-12-06 NOTE — Progress Notes (Signed)
Oncology Nurse Navigator Documentation  Met with Katie Alvarado during final RT to offer support and to celebrate end of radiation treatment. She was accompanied by friend who has been providing her transportation to [facility]. Provided friend Certificate of Recognition for his supportive care. Provided verbal/written post-RT guidance:  Importance of keeping follow-up appts with Dr. Isidore Moos, to call Nutrition PRN.  Importance of protecting treatment area from sun.  Continuation of Sonafine application 2-3 times daily until supply exhausted after which transition to OTC lotion with vitamin E. Explained my role as navigator will continue for several more months and that I will be calling and/or joining her during follow-up visits.   I encouraged her to call me with needs/concerns.   Katie Alvarado verbalized understanding of information provided.  Gayleen Orem, RN, BSN Head & Neck Oncology [place] at [place] [phone]

## 2017-12-07 ENCOUNTER — Ambulatory Visit: Admission: RE | Admit: 2017-12-07 | Payer: Medicare Other | Source: Ambulatory Visit

## 2017-12-09 ENCOUNTER — Encounter: Payer: Self-pay | Admitting: Radiation Oncology

## 2017-12-09 NOTE — Progress Notes (Signed)
  Radiation Oncology         [phone] ________________________________  Name: Katie Alvarado MRN: [id number]  Date: 12/09/2017  DOB: June 28, 1925  End of Treatment Note  Diagnosis:   Cancer Staging Malignant neoplasm of lower lip Staging form: Cutaneous Carcinoma of the Head and Neck, AJCC 8th Edition - Clinical: No stage assigned - Unsigned - Pathologic: Stage III (pT3, pN0, cM0) - Signed by Eppie Gibson, MD on 10/15/2017      Indication for treatment:  Curative       Radiation treatment dates:   10/24/2017-12/05/2017  Site/dose:  Lower lip, 4.4 Gy x 10 fractions given twice weekly for a total dose of 44 Gy  Beams/energy:   Electron, 6 MeV  Narrative: The patient tolerated radiation treatment relatively well. At the beginning of her treatment, she noted fatigue (she naps often).  She developed mucositis and generalized weakness, prompting a treatment break toward the end of her regimen.  Upon resuming RT, she noted that she was eating well and that her lip had healed significantly. She was able to enjoy her 89th birthday party before resuming treatment.  Plan: The patient has completed radiation treatment. The patient will return to radiation oncology clinic for routine followup in one month. I advised them to call or return sooner if they have any questions or concerns related to their recovery or treatment.  -----------------------------------  Eppie Gibson, MD  This document serves as a record of services personally performed by Eppie Gibson, MD. It was created on her behalf by Steva Colder, a trained medical scribe. The creation of this record is based on the scribe's personal observations and the provider's statements to them. This document has been checked and approved by the attending provider.

## 2017-12-12 ENCOUNTER — Ambulatory Visit: Payer: Self-pay | Admitting: Radiation Oncology

## 2017-12-19 DIAGNOSIS — Z8589 Personal history of malignant neoplasm of other organs and systems: Secondary | ICD-10-CM | POA: Diagnosis not present

## 2017-12-19 DIAGNOSIS — R809 Proteinuria, unspecified: Secondary | ICD-10-CM | POA: Diagnosis not present

## 2017-12-19 DIAGNOSIS — Z8739 Personal history of other diseases of the musculoskeletal system and connective tissue: Secondary | ICD-10-CM | POA: Diagnosis not present

## 2017-12-19 DIAGNOSIS — J479 Bronchiectasis, uncomplicated: Secondary | ICD-10-CM | POA: Diagnosis not present

## 2017-12-19 DIAGNOSIS — I10 Essential (primary) hypertension: Secondary | ICD-10-CM | POA: Diagnosis not present

## 2017-12-19 DIAGNOSIS — G720 Drug-induced myopathy: Secondary | ICD-10-CM | POA: Diagnosis not present

## 2017-12-19 DIAGNOSIS — E785 Hyperlipidemia, unspecified: Secondary | ICD-10-CM | POA: Diagnosis not present

## 2017-12-19 DIAGNOSIS — F419 Anxiety disorder, unspecified: Secondary | ICD-10-CM | POA: Diagnosis not present

## 2017-12-19 DIAGNOSIS — Z7984 Long term (current) use of oral hypoglycemic drugs: Secondary | ICD-10-CM | POA: Diagnosis not present

## 2017-12-19 DIAGNOSIS — E118 Type 2 diabetes mellitus with unspecified complications: Secondary | ICD-10-CM | POA: Diagnosis not present

## 2017-12-19 DIAGNOSIS — D509 Iron deficiency anemia, unspecified: Secondary | ICD-10-CM | POA: Diagnosis not present

## 2017-12-29 ENCOUNTER — Encounter: Payer: Self-pay | Admitting: Radiation Oncology

## 2017-12-29 NOTE — Progress Notes (Signed)
  Katie Alvarado presents for follow up of radiation completed 12/05/17 to her lower lip.   Pain issues, if any: She reports soreness to her radiation site. She does have chronic arthritic pain to her back, hips, knees.  Using a feeding tube?: N/A Weight changes, if any:  Wt Readings from Last 3 Encounters:  01/06/18 148 lb 9.6 oz (67.4 kg)  10/15/17 149 lb 3.2 oz (67.7 kg)  08/07/17 148 lb 6.4 oz (67.3 kg)   Swallowing issues, if any: She denies.  Smoking or chewing tobacco? No Using fluoride trays daily? N/A Last ENT visit was on: N/A Other notable issues, if any:  She reports a "bump" to her right lower lip that she wanted to mention today.   BP (!) 159/78 (BP Location: Left Arm, Patient Position: Sitting)   Pulse 76   Temp 98.4 F (36.9 C) (Oral)   Resp 20   Ht 5\' 5"  (1.651 m)   Wt 148 lb 9.6 oz (67.4 kg)   SpO2 98%   BMI 24.73 kg/m

## 2018-01-06 ENCOUNTER — Ambulatory Visit
Admission: RE | Admit: 2018-01-06 | Discharge: 2018-01-06 | Disposition: A | Discharge status: Home / Self Care | Payer: Medicare Other | Source: Ambulatory Visit | Attending: Radiation Oncology | Admitting: Radiation Oncology

## 2018-01-06 ENCOUNTER — Encounter: Payer: Self-pay | Admitting: Radiation Oncology

## 2018-01-06 ENCOUNTER — Other Ambulatory Visit: Payer: Self-pay

## 2018-01-06 VITALS — BP 159/78 | HR 76 | Temp 98.4°F | Resp 20 | Ht 65.0 in | Wt 148.6 lb

## 2018-01-06 DIAGNOSIS — Z923 Personal history of irradiation: Secondary | ICD-10-CM | POA: Insufficient documentation

## 2018-01-06 DIAGNOSIS — Z7984 Long term (current) use of oral hypoglycemic drugs: Secondary | ICD-10-CM | POA: Diagnosis not present

## 2018-01-06 DIAGNOSIS — C001 Malignant neoplasm of external lower lip: Secondary | ICD-10-CM | POA: Diagnosis not present

## 2018-01-06 DIAGNOSIS — Z888 Allergy status to other drugs, medicaments and biological substances status: Secondary | ICD-10-CM | POA: Diagnosis not present

## 2018-01-06 DIAGNOSIS — Z79899 Other long term (current) drug therapy: Secondary | ICD-10-CM | POA: Insufficient documentation

## 2018-01-06 HISTORY — DX: Personal history of irradiation: Z92.3

## 2018-01-06 NOTE — Progress Notes (Signed)
Radiation Oncology         [phone] ________________________________  Name: Katie Alvarado MRN: [id number]  Date: 01/06/2018  DOB: [birth year]  Follow-Up Visit Note  Outpatient  CC: Hulan Fess, MD  Janann August, MD  Diagnosis and Prior Radiotherapy:    ICD-10-CM   1. 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  Doing well. Main complaint is arthritic pain, chronic.       Eating well. Weight is stable.  Has a "whitehead" on right lip, lower.                        ALLERGIES:  is allergic to welchol [colesevelam hcl]; glucosamine; tramadol; celecoxib; and statins.  Meds: Current Outpatient Medications  Medication Sig Dispense Refill  . acetaminophen (TYLENOL) 325 MG tablet Take 2 tablets (650 mg total) by mouth every 6 (six) hours as needed for pain. (Patient taking differently: Take 325 mg by mouth every 6 (six) hours as needed for pain. )    . allopurinol (ZYLOPRIM) 100 MG tablet Take 1 tablet by mouth daily.    Marland Kitchen ALPRAZolam (XANAX) 0.25 MG tablet Take 0.25 mg by mouth 2 (two) times daily as needed for sleep or anxiety. For anxiety.    Marland Kitchen amLODipine (NORVASC) 2.5 MG tablet Take 2.5 mg by mouth daily.     . Artificial Tear Ointment (DRY EYES OP) Apply 1 drop to eye 2 (two) times daily as needed (for dry eyes).     . budesonide (PULMICORT) 0.25 MG/2ML nebulizer solution Take 2 mLs (0.25 mg total) by nebulization daily. 60 mL 12  .  cholecalciferol (VITAMIN D) 1000 UNITS tablet Take 2,000 Units by mouth every morning.     . Cinnamon 500 MG capsule Take 500 mg by mouth every morning.     Marland Kitchen esomeprazole (NEXIUM) 40 MG capsule Take 40 mg by mouth every morning.     Marland Kitchen ipratropium (ATROVENT) 0.02 % nebulizer solution Take 2.5 mLs (500 mcg total) by nebulization 4 (four) times daily. 360 mL 12  . levocetirizine (XYZAL) 5 MG tablet Take 1 tablet (5 mg total) by mouth daily as needed for allergies. 30 tablet 5  . lidocaine (XYLOCAINE) 2 % solution Apply to lip and any other sore spots in mouth PRN. Do no ingest more than 20 mL daily. 100 mL 3  . losartan (COZAAR) 100 MG tablet Take 100 mg by mouth daily.     . meclizine (ANTIVERT) 25 MG tablet Take 25 mg by mouth every 6 (six) hours as needed for dizziness.     . metFORMIN (GLUCOPHAGE) 500 MG tablet Take 1,000 mg by mouth 2 (two) times daily.     . Omega-3 Fatty Acids (FISH OIL) 1000 MG CAPS Take 1 capsule by mouth daily.    . ondansetron (ZOFRAN) 4 MG tablet Take 1 tablet (4 mg total) by mouth every Monday, Wednesday, and  Friday. With breakfast (Patient not taking: Reported on 10/15/2017) 12 tablet 6  . ONE TOUCH ULTRA TEST test strip USE TO CHECK BLOOD SUGAR ONCE DAILY 90  5  . ONETOUCH DELICA LANCETS 17B MISC USE TO CHECK BLOOD SUGAR ONCE DAILY AS DIRECTED  5  . PROAIR HFA 108 (90 Base) MCG/ACT inhaler INHALE 2 PUFFS INTO THE LUNGS EVERY 6 HOURS AS NEEDED FOR WHEEZING OR SHORTNESS OF BREATH 8.5 Inhaler 3  . Respiratory Therapy Supplies (FLUTTER) DEVI Use as directed 1 each 0  . traMADol (ULTRAM) 50 MG tablet Take 25 mg by mouth every 6 (six) hours as needed for moderate pain.     . vitamin E 400 UNIT capsule Take 400 Units by mouth daily.     No current facility-administered medications for this encounter.     Physical Findings: The patient is in no acute distress. Patient is alert and oriented.  height is 5\' 5"  (1.651 m) and weight is 148 lb 9.6 oz (67.4 kg). Her oral  temperature is 98.4 F (36.9 C). Her blood pressure is 159/78 (abnormal) and her pulse is 76. Her respiration is 20 and oxygen saturation is 98%. Marland Kitchen    NAD Uses a walker Palpable scar tissue as expected along lower lip in surgical site.  Healed well from RT. No residual mucositis or skin irritation in lip/tongue/mouth. Punctate white bump at junction of skin/right lower lip; follicular in nature, benign appearing  Lab Findings: Lab Results  Component Value Date   WBC 9.4 01/09/2017   HGB 11.0 (L) 01/09/2017   HCT 32.8 (L) 01/09/2017   MCV 84.1 01/09/2017   PLT 278 01/09/2017    Radiographic Findings: No results found.  Impression/Plan:  Doing well. NED within RT field of lip.  See me again in 9mo.  Patient reports she needs f/u in Dr Albertini's clinic in part due to other skin lesions.  Our scheduler will coordinate that, as well as her appt w/ me.    _____________________________________   Eppie Gibson, MD

## 2018-01-07 ENCOUNTER — Telehealth: Payer: Self-pay | Admitting: Radiation Oncology

## 2018-01-07 NOTE — Telephone Encounter (Signed)
LVM for patient. A dermatology appt was made for her at Dr. Thana Ates office for Dr. Dede Query for Friday, Oct 25th at 2:45.

## 2018-01-15 DIAGNOSIS — M25551 Pain in right hip: Secondary | ICD-10-CM | POA: Diagnosis not present

## 2018-01-15 DIAGNOSIS — M25512 Pain in left shoulder: Secondary | ICD-10-CM | POA: Diagnosis not present

## 2018-01-22 DIAGNOSIS — N390 Urinary tract infection, site not specified: Secondary | ICD-10-CM | POA: Diagnosis not present

## 2018-01-22 DIAGNOSIS — R5383 Other fatigue: Secondary | ICD-10-CM | POA: Diagnosis not present

## 2018-01-22 DIAGNOSIS — M79605 Pain in left leg: Secondary | ICD-10-CM | POA: Diagnosis not present

## 2018-01-22 DIAGNOSIS — N3001 Acute cystitis with hematuria: Secondary | ICD-10-CM | POA: Diagnosis not present

## 2018-02-05 DIAGNOSIS — Z85828 Personal history of other malignant neoplasm of skin: Secondary | ICD-10-CM | POA: Diagnosis not present

## 2018-02-05 DIAGNOSIS — D0439 Carcinoma in situ of skin of other parts of face: Secondary | ICD-10-CM | POA: Diagnosis not present

## 2018-02-05 DIAGNOSIS — C44629 Squamous cell carcinoma of skin of left upper limb, including shoulder: Secondary | ICD-10-CM | POA: Diagnosis not present

## 2018-02-05 DIAGNOSIS — L570 Actinic keratosis: Secondary | ICD-10-CM | POA: Diagnosis not present

## 2018-02-05 DIAGNOSIS — B078 Other viral warts: Secondary | ICD-10-CM | POA: Diagnosis not present

## 2018-02-05 DIAGNOSIS — L821 Other seborrheic keratosis: Secondary | ICD-10-CM | POA: Diagnosis not present

## 2018-02-05 DIAGNOSIS — D485 Neoplasm of uncertain behavior of skin: Secondary | ICD-10-CM | POA: Diagnosis not present

## 2018-02-07 DIAGNOSIS — M79622 Pain in left upper arm: Secondary | ICD-10-CM | POA: Diagnosis not present

## 2018-02-09 ENCOUNTER — Ambulatory Visit (INDEPENDENT_AMBULATORY_CARE_PROVIDER_SITE_OTHER): Payer: Medicare Other | Admitting: Internal Medicine

## 2018-02-09 ENCOUNTER — Encounter: Payer: Self-pay | Admitting: Internal Medicine

## 2018-02-09 DIAGNOSIS — K219 Gastro-esophageal reflux disease without esophagitis: Secondary | ICD-10-CM

## 2018-02-09 DIAGNOSIS — J479 Bronchiectasis, uncomplicated: Secondary | ICD-10-CM

## 2018-02-09 MED ORDER — FLUTICASONE-UMECLIDIN-VILANT 100-62.5-25 MCG/INH IN AEPB: 1.0000 | INHALATION_SPRAY | Freq: Every day | RESPIRATORY_TRACT | 0 refills | Status: DC

## 2018-02-09 NOTE — Progress Notes (Signed)
HPI F never smoker, followed for bronchiectasis/ chronic bronchitis/ MAIC, complicated by hx of GERD, DM , arthritis, aortic atherosclerosis Sputum Cx 06/09/12 POS MAIC  Started 07/14/12- Biaxin 500mg  TIW, EMB 1200 TIW, Rifabutin 300 TIW  ended- 08/12/12 due to weakness and malaise,  CT chest 06/01/12- Progressive bilateral nodular bronchiectasis Office spirometry  01/18/13- moderate obstructive airways disease-FVC 2.10/69%, FEV1 1.25/58%,  FEV1/FVC  0.59/ 84%,  FEF 25-75% 0.47/33% --------------------------------------------------------------------------------  08/07/17- 84 year old female never smoker followed for bronchiectasis/chronic bronchitis/MAIC, complicated by history GERD, DM, arthritis,hyponatremia, aortic atherosclerosis ----Bronchiectasis:Pt has noticed increased SOB with movement.  She does not get around much anymore-eyesight is limiting.  Stable chronic cough only produces clear mucus.  Some recent seasonal nasal congestion with head stopped up this week affecting her equilibrium.  Antibiotic helped a month or more ago for chest congestion.  Continues to use her pneumatic vest and finds it helpful. CXR 06/24/17- Stable bronchitic changes and patchy RIGHT upper, LEFT lung base opacities corresponding to bronchiectasis and infiltrates on prior CT, acute component not excluded.  02/09/18- 84 year old female never smoker followed for bronchiectasis/chronic bronchitis/MAIC, complicated by history GERD, DM, arthritis,hyponatremia, aortic atherosclerosis -----She has had radiation therapy for skin cancer on lips.She is more SOB now. Pro-air HFA, neb ipratropium, neb budesonide, Had excision and XRT for skin cancer lower lip.  Discomfort interfered with eating and she lost a bit of weight.  Still markedly limited by arthritis and poor eyesight. Little change in chronic rattling cough intermittently productive.  She cannot see the color but does not notice fever.  ROS-see HPI + =  positive Constitutional:     No-weight loss, night sweats, no-fevers, chills,  +fatigue, lassitude. HEENT:   No-  headaches, difficulty swallowing, +tooth/dental problems, no-sore throat,       No-  sneezing, itching, ear ache, +nasal congestion, post nasal drip, + declining eyesight CV:  No-   chest pain, orthopnea, PND, swelling in lower extremities, anasarca,  dizziness, palpitations Resp: +Mild  shortness of breath with exertion or at rest.             +productive cough,  + non-productive cough,  No- coughing up of blood.              No-   change in color of mucus.  No- wheezing.   Skin: No-   rash or lesions. GI:  No-   heartburn, indigestion, abdominal pain, nausea, vomiting,  GU:  MS:  +Active low back and bilateral hip pain Neuro-     complaint of generalized weakness without myalgias Psych:  No- change in mood or affect. No depression or anxiety.  No memory loss.  Obj- Physical exam General- Alert, Oriented, Affect-appropriate, Distress- none acute.+ Always looks well/neatly groomed. Skin- rash-none,  excoriation- none Lymphadenopathy- none Head- atraumatic            Eyes- + decreased vision            Ears- Hearing, canals-normal            Nose- Clear, no-Septal dev, mucus, polyps, erosion, perforation             Throat- Mallampati II , mucosa very dry , drainage- none, tonsils- atrophic Neck- flexible , trachea midline, no stridor , thyroid nl, carotid no bruit Chest - symmetrical excursion , unlabored           Heart/CV- RRR , no murmur , no gallop  , no rub, nl s1 s2                           -  JVD- none , edema- none, stasis changes- none, varices- none           Lung-   Cough+ rattling, + rhonchi right lateral base and left upper., unlabored, no wheeze ,                                    dullness-none, rub-none.  Chest wall-  Abd-  Br/ Gen/ Rectal- Not done, not indicated Extrem-   No-clubbing, none, atrophy- none, strength- nl for age. + Cool/dusky feet Neuro-  grossly intact to observation

## 2018-02-09 NOTE — Patient Instructions (Signed)
Sample Trelegy    Inhale 1 puff, then rinse mouth, once daily  Order- Flu vax senior   Please call if we can help

## 2018-02-12 NOTE — Assessment & Plan Note (Signed)
He does not recognize active reflux or aspiration events but I reemphasized precautions.  Still suspect this may be the original reason she developed chronic bronchitis/bronchiectasis.

## 2018-02-12 NOTE — Assessment & Plan Note (Signed)
Has not had any acute exacerbation.  Some variation with weather and humidity.  We will see if a bronchodilator therapy can make any difference for her. Plan-sample Trelegy with discussion, flu vaccine

## 2018-02-13 DIAGNOSIS — L728 Other follicular cysts of the skin and subcutaneous tissue: Secondary | ICD-10-CM | POA: Diagnosis not present

## 2018-02-13 DIAGNOSIS — Z85828 Personal history of other malignant neoplasm of skin: Secondary | ICD-10-CM | POA: Diagnosis not present

## 2018-02-23 ENCOUNTER — Telehealth: Payer: Self-pay | Admitting: Internal Medicine

## 2018-02-23 MED ORDER — FLUTICASONE-UMECLIDIN-VILANT 100-62.5-25 MCG/INH IN AEPB: 1.0000 | INHALATION_SPRAY | Freq: Every day | RESPIRATORY_TRACT | 3 refills | Status: DC

## 2018-02-23 NOTE — Telephone Encounter (Signed)
Prescription has been sent in. Nothing further needed.

## 2018-03-04 DIAGNOSIS — H353122 Nonexudative age-related macular degeneration, left eye, intermediate dry stage: Secondary | ICD-10-CM | POA: Diagnosis not present

## 2018-03-16 DIAGNOSIS — L820 Inflamed seborrheic keratosis: Secondary | ICD-10-CM | POA: Diagnosis not present

## 2018-03-16 DIAGNOSIS — Z23 Encounter for immunization: Secondary | ICD-10-CM | POA: Diagnosis not present

## 2018-03-16 DIAGNOSIS — D0430 Carcinoma in situ of skin of unspecified part of face: Secondary | ICD-10-CM | POA: Diagnosis not present

## 2018-03-16 DIAGNOSIS — L570 Actinic keratosis: Secondary | ICD-10-CM | POA: Diagnosis not present

## 2018-03-24 DIAGNOSIS — H353114 Nonexudative age-related macular degeneration, right eye, advanced atrophic with subfoveal involvement: Secondary | ICD-10-CM | POA: Diagnosis not present

## 2018-03-24 DIAGNOSIS — E119 Type 2 diabetes mellitus without complications: Secondary | ICD-10-CM | POA: Diagnosis not present

## 2018-03-24 DIAGNOSIS — H43822 Vitreomacular adhesion, left eye: Secondary | ICD-10-CM | POA: Diagnosis not present

## 2018-03-24 DIAGNOSIS — H353223 Exudative age-related macular degeneration, left eye, with inactive scar: Secondary | ICD-10-CM | POA: Diagnosis not present

## 2018-04-03 DIAGNOSIS — C44629 Squamous cell carcinoma of skin of left upper limb, including shoulder: Secondary | ICD-10-CM | POA: Diagnosis not present

## 2018-04-03 DIAGNOSIS — C44622 Squamous cell carcinoma of skin of right upper limb, including shoulder: Secondary | ICD-10-CM | POA: Diagnosis not present

## 2018-04-03 DIAGNOSIS — T451X5A Adverse effect of antineoplastic and immunosuppressive drugs, initial encounter: Secondary | ICD-10-CM | POA: Diagnosis not present

## 2018-04-20 DIAGNOSIS — R05 Cough: Secondary | ICD-10-CM | POA: Diagnosis not present

## 2018-04-20 DIAGNOSIS — J069 Acute upper respiratory infection, unspecified: Secondary | ICD-10-CM | POA: Diagnosis not present

## 2018-04-20 DIAGNOSIS — R109 Unspecified abdominal pain: Secondary | ICD-10-CM | POA: Diagnosis not present

## 2018-04-20 DIAGNOSIS — J189 Pneumonia, unspecified organism: Secondary | ICD-10-CM | POA: Diagnosis not present

## 2018-04-22 DIAGNOSIS — J449 Chronic obstructive pulmonary disease, unspecified: Secondary | ICD-10-CM | POA: Diagnosis not present

## 2018-04-22 DIAGNOSIS — J180 Bronchopneumonia, unspecified organism: Secondary | ICD-10-CM | POA: Diagnosis not present

## 2018-04-27 NOTE — Progress Notes (Signed)
@Patient  ID: Katie Alvarado, female    DOB: 1926/04/10, 85 y.o.   MRN: [id number]  Chief Complaint  Patient presents with  . Acute Visit    sob, left rib pain    Referring provider: Hulan Fess, MD  HPI:  85 year old female never smoker followed for bronchiectasis, chronic bronchitis, MAIC  PMH: GERD, diabetes, arthritis, aortic arthrosclerosis Smoker/ Smoking History: Never Smoker Maintenance:  Trelegy Ellipta  Pt of: Dr. Annamaria Boots  Sputum Cx 06/09/12 POS MAIC  Started 07/14/12- Biaxin 500mg  TIW, EMB 1200 TIW, Rifabutin 300 TIW  ended- 08/12/12 due to weakness and malaise,   04/28/2018  - Visit   85 year old female never smoker presenting today for an acute visit.  Patient reporting left rib pain.  Patient reports that this started on 04/20/2018.  Patient presented to an urgent care on 04/21/2018.  Were they performed a chest x-ray which showed left lower lobe pneumonia and when she was prescribed a Z-Pak as well as prednisone.  Patient reports that since this treatment she has had some clinical improvement but the left rib pain still persist.  Patient with known severe bronchiectasis in her left lower lobe.  Patient has not been using a flutter valve regularly.  Patient's blood sugars are in the 230s while she is on prednisone.  Patient reports adherence to Trelegy Ellipta and still has been using her rescue inhaler about 2-3 times a day.    Tests:  Sputum Cx 06/09/12 POS MAIC  Started 07/14/12- Biaxin 500mg  TIW, EMB 1200 TIW, Rifabutin 300 TIW  ended- 08/12/12 due to weakness and malaise  CT chest 06/01/12- Progressive bilateral nodular bronchiectasis  06/24/2017-chest x-ray- stable bronchitic changes and patchy right upper, left lung base opacities corresponding to bronchiectasis and infiltrates on prior CT  09/24/2016-CT chest without contrast- appearance of the chest is very similar to prior study, most compatible with chronic indolent atypical infection such as MAI, no significant  progression of the disease is noted on today's exam  Office spirometry  01/18/13- moderate obstructive airways disease-FVC 2.10/69%, FEV1 1.25/58%, FEV1/FVC  0.59/ 84%,  FEF 25-75% 0.47/33%   FENO:  No results found for: NITRICOXIDE  PFT: No flowsheet data found.  Imaging: No results found.    Specialty Problems      Pulmonary Problems   ACUTE NASOPHARYNGITIS    Qualifier: Diagnosis of  By: Annamaria Boots MD, Clinton D       Bronchiectasis with acute exacerbation ([place])    CT chest 06/01/12- Progressive bilateral nodular bronchiectasis. Associated nodules have increased in size including mass-like nodule within the right lower lobe measuring up to 1.2 cm. We reviewed these images together.         BRONCHIECTASIS, + MAIC    Sputum culture positive [facility]  06/09/2012 Triple therapy begun 07/14/2012-Biaxin 500 mg TIW, EMB 400x3 TIW, Rifabutin 300 TIW- ended 08/12/12-malaise. Office spirometry 01/18/13- moderate obstructive airways disease-FVC 2.10/69%, FEV1 1.25/58%,  FEV1/FVC 0.59/ 84%, FEF 25-75% 0.47/33%       Bronchiectasis without complication (HCC)   Atypical mycobacterial infection of lung (HCC)   Chronic rhinitis   Pneumonia of left lower lobe due to infectious organism (HCC)      Allergies  Allergen Reactions  . Welchol [Colesevelam Hcl] Hives  . Glucosamine Other (See Comments)    Unknown  . Tramadol Nausea Only  . Celecoxib Hives, Itching and Rash  . Statins Rash    Immunization History  Administered Date(s) Administered  . Influenza Split 01/21/2011, 01/02/2012, 01/20/2013, 01/21/2016, 01/20/2017  .  Influenza Whole 02/02/2010  . Influenza, High Dose Seasonal PF 01/20/2018  . Influenza,inj,Quad PF,6+ Mos 01/16/2015  . Influenza-Unspecified 12/21/2013  . Pneumococcal Conjugate-13 09/08/2013  . Pneumococcal Polysaccharide-23 07/24/2009  . Tdap 01/06/2013    Past Medical History:  Diagnosis Date  . Acute nasopharyngitis (common cold)   . Arthritis   .  Bronchiectasis with acute exacerbation ([place])   . Cancer ([place])    skin cancer  . COPD (chronic obstructive pulmonary disease) ([place])    sees dr. Annamaria Boots   . Cough   . Diabetes mellitus    fasting 130-150  . Dizziness   . Esophageal reflux   . History of radiation therapy 10/24/17- 12/05/17   Lower lip, 4.4 Gy X 10 fractions given twice weekly for a total dose of 44 Gy.  Marland Kitchen Hypertension   . Insomnia, unspecified   . Other symptoms involving nervous and musculoskeletal systems(781.99)   . Pneumonia    hx of  . Shortness of breath    exertion    Tobacco History: Social History   Tobacco Use  Smoking Status Never Smoker  Smokeless Tobacco Never Used   Counseling given: Yes   Outpatient Encounter Medications as of 04/28/2018  Medication Sig  . acetaminophen (TYLENOL) 325 MG tablet Take 2 tablets (650 mg total) by mouth every 6 (six) hours as needed for pain. (Patient taking differently: Take 325 mg by mouth every 6 (six) hours as needed for pain. )  . allopurinol (ZYLOPRIM) 100 MG tablet Take 1 tablet by mouth daily.  Marland Kitchen ALPRAZolam (XANAX) 0.25 MG tablet Take 0.25 mg by mouth 2 (two) times daily as needed for sleep or anxiety. For anxiety.  Marland Kitchen amLODipine (NORVASC) 2.5 MG tablet Take 2.5 mg by mouth daily.   . Artificial Tear Ointment (DRY EYES OP) Apply 1 drop to eye 2 (two) times daily as needed (for dry eyes).   . budesonide (PULMICORT) 0.25 MG/2ML nebulizer solution Take 2 mLs (0.25 mg total) by nebulization daily.  . cholecalciferol (VITAMIN D) 1000 UNITS tablet Take 2,000 Units by mouth every morning.   . Cinnamon 500 MG capsule Take 500 mg by mouth every morning.   Marland Kitchen esomeprazole (NEXIUM) 40 MG capsule Take 40 mg by mouth every morning.   . Fluticasone-Umeclidin-Vilant (TRELEGY ELLIPTA) 100-62.5-25 MCG/INH AEPB Inhale 1 puff into the lungs daily.  Marland Kitchen ipratropium (ATROVENT) 0.02 % nebulizer solution Take 2.5 mLs (500 mcg total) by nebulization 4 (four) times daily.  Marland Kitchen levocetirizine  (XYZAL) 5 MG tablet Take 1 tablet (5 mg total) by mouth daily as needed for allergies.  Marland Kitchen lidocaine (XYLOCAINE) 2 % solution Apply to lip and any other sore spots in mouth PRN. Do no ingest more than 20 mL daily.  Marland Kitchen losartan (COZAAR) 100 MG tablet Take 100 mg by mouth daily.   . meclizine (ANTIVERT) 25 MG tablet Take 25 mg by mouth every 6 (six) hours as needed for dizziness.   . metFORMIN (GLUCOPHAGE) 500 MG tablet Take 1,000 mg by mouth 2 (two) times daily.   . Omega-3 Fatty Acids (FISH OIL) 1000 MG CAPS Take 1 capsule by mouth daily.  . ondansetron (ZOFRAN) 4 MG tablet Take 1 tablet (4 mg total) by mouth every Monday, Wednesday, and Friday. With breakfast  . ONE TOUCH ULTRA TEST test strip USE TO CHECK BLOOD SUGAR ONCE DAILY 90  . ONETOUCH DELICA LANCETS 61Y MISC USE TO CHECK BLOOD SUGAR ONCE DAILY AS DIRECTED  . PROAIR HFA 108 (90 Base) MCG/ACT inhaler INHALE  2 PUFFS INTO THE LUNGS EVERY 6 HOURS AS NEEDED FOR WHEEZING OR SHORTNESS OF BREATH  . Respiratory Therapy Supplies (FLUTTER) DEVI Use as directed  . traMADol (ULTRAM) 50 MG tablet Take 25 mg by mouth every 6 (six) hours as needed for moderate pain.   . vitamin E 400 UNIT capsule Take 400 Units by mouth daily.  Marland Kitchen levofloxacin (LEVAQUIN) 750 MG tablet Take 1 tablet (750 mg total) by mouth daily for 5 days.   No facility-administered encounter medications on file as of 04/28/2018.      Review of Systems  Review of Systems  Constitutional: Positive for fatigue. Negative for appetite change and fever.  HENT: Negative for congestion, sinus pressure and sinus pain.   Respiratory: Positive for cough, shortness of breath (with exertion ) and wheezing (a little at night).   Cardiovascular: Positive for palpitations (occasional per pt ). Negative for chest pain.  Gastrointestinal: Negative for diarrhea, nausea and vomiting.  Musculoskeletal:       +left rib pain  Neurological: Positive for weakness.     Physical Exam  BP (!) 124/58 (BP  Location: Left Arm, Cuff Size: Normal)   Pulse 86   Temp 98.7 F (37.1 C) (Oral)   Ht 5\' 8"  (1.727 m)   Wt 143 lb 12.8 oz (65.2 kg)   SpO2 97%   BMI 21.86 kg/m   Wt Readings from Last 5 Encounters:  04/28/18 143 lb 12.8 oz (65.2 kg)  02/09/18 147 lb 12.8 oz (67 kg)  01/06/18 148 lb 9.6 oz (67.4 kg)  10/15/17 149 lb 3.2 oz (67.7 kg)  08/07/17 148 lb 6.4 oz (67.3 kg)     Physical Exam  Constitutional: She is oriented to person, place, and time and well-developed, well-nourished, and in no distress. No distress.  HENT:  Head: Normocephalic and atraumatic.  Right Ear: Hearing, tympanic membrane, external ear and ear canal normal.  Left Ear: Hearing, tympanic membrane, external ear and ear canal normal.  Nose: Nose normal. Right sinus exhibits no maxillary sinus tenderness and no frontal sinus tenderness. Left sinus exhibits no maxillary sinus tenderness and no frontal sinus tenderness.  Mouth/Throat: Uvula is midline and oropharynx is clear and moist. No oropharyngeal exudate.  Eyes: Pupils are equal, round, and reactive to light.  Neck: Normal range of motion. Neck supple. No JVD present.  Cardiovascular: Normal rate, regular rhythm and normal heart sounds.  Pulmonary/Chest: Effort normal. No accessory muscle usage. No respiratory distress. She has no decreased breath sounds. She has no wheezes. She has no rhonchi. She has rales (Left lung crackles ) in the left middle field and the left lower field.  Abdominal: Soft. Bowel sounds are normal. There is no abdominal tenderness.  Musculoskeletal: Normal range of motion.        General: No edema.  Lymphadenopathy:    She has no cervical adenopathy.  Neurological: She is alert and oriented to person, place, and time. Gait normal.  Skin: Skin is warm and dry. She is not diaphoretic. No erythema.  Psychiatric: Mood, memory, affect and judgment normal.  Nursing note and vitals reviewed.     Lab Results:  CBC    Component Value  Date/Time   WBC 9.4 01/09/2017 1058   RBC 3.90 01/09/2017 1058   HGB 11.0 (L) 01/09/2017 1058   HCT 32.8 (L) 01/09/2017 1058   PLT 278 01/09/2017 1058   MCV 84.1 01/09/2017 1058   MCH 28.2 01/09/2017 1058   MCHC 33.5 01/09/2017 1058  RDW 13.6 01/09/2017 1058   LYMPHSABS 1.5 01/09/2017 1058   MONOABS 1.1 (H) 01/09/2017 1058   EOSABS 0.1 01/09/2017 1058   BASOSABS 0.0 01/09/2017 1058    BMET    Component Value Date/Time   NA 131 (L) 01/09/2017 1058   K 4.6 01/09/2017 1058   CL 93 (L) 01/09/2017 1058   CO2 27 01/09/2017 1058   GLUCOSE 130 (H) 01/09/2017 1058   BUN 17 01/09/2017 1058   CREATININE 0.78 01/09/2017 1058   CREATININE 0.88 03/05/2016 1511   CALCIUM 9.4 01/09/2017 1058   GFRNONAA >60 01/09/2017 1058   GFRAA >60 01/09/2017 1058    BNP No results found for: BNP  ProBNP    Component Value Date/Time   PROBNP 39.0 08/31/2008 1710      Assessment & Plan:     BRONCHIECTASIS, + MAIC Assessment: -Frail 85 year old female with known history of bronchiectasis and previous MAIC, failed triple therapy due to malaise and fatigue -04/21/2018 chest x-ray at urgent care showed left lower lobe pneumonia per patient, have not reviewed those images -Patient was prescribed a Z-Pak as well as prednisone -Clinical improvement since 04/21/2018 treatment, but left rib pain and fatigue persist -Persistent left lower rib pain -Left lower lobe crackles on exam today, no wheezing  Plan: -After the patient did not receive a shot of antibiotic due to her bronchiectasis and history, will treat patient with -Levaquin today -Chest x-ray today -Lab work today-CBC and BMP -Patient to resume flutter valve use to help with bronchiectasis -Okay for patient to stop prednisone as her blood sugars are elevated and patient does not want to continue taking it. -Patient knows to follow-up sooner if symptoms are not improving. -Patient ensure that she has an adequate diet as well as  hydrating appropriately -Patient to follow-up in 2 to 4 weeks with Dr. Annamaria Boots or Wyn Quaker NP, per Joellen Jersey is okay to work pt in with Dr. Annamaria Boots  Pneumonia of left lower lobe due to infectious organism [facility]) Assessment: -Apparent left lower lobe pneumonia based off of 04/21/2018 chest x-ray at urgent care >>> Treated with Z-Pak and prednisone, patient reports some clinical improvement since starting this treatment -Persistent left lower rib pain -Left lower lobe crackles on exam -Patient with known severe bronchiectasis of the left lower lobe  Plan: -We will treat as a bronchiectatic exacerbation versus a left lower lobe pneumonia but will clarify imaging with a chest x-ray today -Levaquin today -Chest x-ray today -Lab work today-CBC and BMP -Patient to follow-up in 2 to 4 weeks to ensure that she there is clinical improvement     Lauraine Rinne, NP 04/28/2018   This appointment was 34 min long with over 50% of the time in direct face-to-face patient care, assessment, plan of care, and follow-up.

## 2018-04-28 ENCOUNTER — Encounter: Payer: Self-pay | Admitting: Pulmonary Disease

## 2018-04-28 ENCOUNTER — Other Ambulatory Visit (INDEPENDENT_AMBULATORY_CARE_PROVIDER_SITE_OTHER): Payer: Medicare Other

## 2018-04-28 ENCOUNTER — Ambulatory Visit (INDEPENDENT_AMBULATORY_CARE_PROVIDER_SITE_OTHER)
Admission: RE | Admit: 2018-04-28 | Discharge: 2018-04-28 | Disposition: A | Discharge status: Home / Self Care | Payer: Medicare Other | Source: Ambulatory Visit | Attending: Pulmonary Disease | Admitting: Pulmonary Disease

## 2018-04-28 ENCOUNTER — Ambulatory Visit (INDEPENDENT_AMBULATORY_CARE_PROVIDER_SITE_OTHER): Payer: Medicare Other | Admitting: Pulmonary Disease

## 2018-04-28 VITALS — BP 124/58 | HR 86 | Temp 98.7°F | Ht 68.0 in | Wt 143.8 lb

## 2018-04-28 DIAGNOSIS — J181 Lobar pneumonia, unspecified organism: Secondary | ICD-10-CM | POA: Diagnosis not present

## 2018-04-28 DIAGNOSIS — J479 Bronchiectasis, uncomplicated: Secondary | ICD-10-CM | POA: Diagnosis not present

## 2018-04-28 DIAGNOSIS — J189 Pneumonia, unspecified organism: Secondary | ICD-10-CM

## 2018-04-28 LAB — BASIC METABOLIC PANEL
BUN: 24 mg/dL — AB (ref 6–23)
CALCIUM: 9.9 mg/dL (ref 8.4–10.5)
CHLORIDE: 94 meq/L — AB (ref 96–112)
CO2: 29 meq/L (ref 19–32)
CREATININE: 0.89 mg/dL (ref 0.40–1.20)
GFR: 63.27 mL/min (ref >60.00)
Glucose, Bld: 102 mg/dL — ABNORMAL HIGH (ref 70–99)
POTASSIUM: 4.1 meq/L (ref 3.5–5.1)
SODIUM: 133 meq/L — AB (ref 135–145)

## 2018-04-28 LAB — CBC WITH DIFFERENTIAL/PLATELET
BASOS PCT: 0.3 % (ref 0.0–3.0)
Basophils Absolute: 0 10*3/uL (ref 0.0–0.1)
Eosinophils Absolute: 0 10*3/uL (ref 0.0–0.7)
Eosinophils Relative: 0.3 % (ref 0.0–5.0)
HCT: 37.2 % (ref 36.0–46.0)
HEMOGLOBIN: 12.2 g/dL (ref 12.0–15.0)
LYMPHS PCT: 9.9 % — AB (ref 12.0–46.0)
Lymphs Abs: 1.2 10*3/uL (ref 0.7–4.0)
MCHC: 32.9 g/dL (ref 30.0–36.0)
MCV: 88.9 fl (ref 78.0–100.0)
MONO ABS: 0.8 10*3/uL (ref 0.1–1.0)
Monocytes Relative: 6.3 % (ref 3.0–12.0)
Neutro Abs: 10.2 10*3/uL — ABNORMAL HIGH (ref 1.4–7.7)
Neutrophils Relative %: 83.2 % — ABNORMAL HIGH (ref 43.0–77.0)
Platelets: 476 10*3/uL — ABNORMAL HIGH (ref 150.0–400.0)
RBC: 4.18 Mil/uL (ref 3.87–5.11)
RDW: 13.8 % (ref 11.5–15.5)
WBC: 12.3 10*3/uL — AB (ref 4.0–10.5)

## 2018-04-28 MED ORDER — LEVOFLOXACIN 750 MG PO TABS: 750.0000 mg | ORAL_TABLET | Freq: Every day | ORAL | 0 refills | Status: AC

## 2018-04-28 NOTE — Assessment & Plan Note (Addendum)
Assessment: -Frail 85 year old female with known history of bronchiectasis and previous MAIC, failed triple therapy due to malaise and fatigue -04/21/2018 chest x-ray at urgent care showed left lower lobe pneumonia per patient, have not reviewed those images -Patient was prescribed a Z-Pak as well as prednisone -Clinical improvement since 04/21/2018 treatment, but left rib pain and fatigue persist -Persistent left lower rib pain -Left lower lobe crackles on exam today, no wheezing  Plan: -After the patient did not receive a shot of antibiotic due to her bronchiectasis and history, will treat patient with -Levaquin today -Chest x-ray today -Lab work today-CBC and BMP -Patient to resume flutter valve use to help with bronchiectasis -Okay for patient to stop prednisone as her blood sugars are elevated and patient does not want to continue taking it. -Patient knows to follow-up sooner if symptoms are not improving. -Patient ensure that she has an adequate diet as well as hydrating appropriately -Patient to follow-up in 2 to 4 weeks with Dr. Annamaria Boots or Wyn Quaker NP, per Joellen Jersey is okay to work pt in with Dr. Annamaria Boots

## 2018-04-28 NOTE — Patient Instructions (Addendum)
Chest xray   Lab work today   Please proceed to the [facility] location were used to be located and go to the basement to receive your chest x-ray as well as your lab work  Levaquin 750mg  daily for 5 days  Trelegy Ellipta  >>> 1 puff daily in the morning >>>rinse mouth out after use  >>> This inhaler contains 3 medications that help manage her respiratory status, contact our office if you cannot afford this medication or unable to remain on this medication  Follow-up with Dr. Annamaria Boots in 2 to 4 weeks >>> Okay to work in per [employer] needs to be a 30-minute visit >>> If there are no available appointments this time slot please contact Katie before the patient leaves so they can work the patient in  Please let us know if symptoms are not improving and contact our office  Okay to stop prednisone today  Please make sure you are eating nutritious meals as well as hydrating appropriately  Bronchiectasis: This is the medical term which indicates that you have damage, dilated airways making you more susceptible to respiratory infection. Use a flutter valve 10 breaths twice a day or 4 to 5 breaths 4-5 times a day to help clear mucus out Let us know if you have cough with change in mucus color or fevers or chills.  At that point you would need an antibiotic. Maintain a healthy nutritious diet, eating whole foods Take your medications as prescribed       It is flu season:   >>>Remember to be washing your hands regularly, using hand sanitizer, be careful to use around herself with has contact with people who are sick will increase her chances of getting sick yourself. >>> Best ways to protect herself from the flu: Receive the yearly flu vaccine, practice good hand hygiene washing with soap and also using hand sanitizer when available, eat a nutritious meals, get adequate rest, hydrate appropriately   Please contact the office if your symptoms worsen or you have concerns that you are not  improving.   Thank you for choosing [facility] for your healthcare, and for allowing [place] to partner with you on your healthcare journey. I am thankful to be able to provide care to you today.   Wyn Quaker FNP-C

## 2018-04-28 NOTE — Progress Notes (Signed)
Chest x-ray showing bronchiectasis.  This is what we thought.  Continue with antibiotics as prescribed.  Keep close follow-up with our office.  Wyn Quaker, FNP

## 2018-04-28 NOTE — Assessment & Plan Note (Addendum)
Assessment: -Apparent left lower lobe pneumonia based off of 04/21/2018 chest x-ray at urgent care >>> Treated with Z-Pak and prednisone, patient reports some clinical improvement since starting this treatment -Persistent left lower rib pain -Left lower lobe crackles on exam -Patient with known severe bronchiectasis of the left lower lobe  Plan: -We will treat as a bronchiectatic exacerbation versus a left lower lobe pneumonia but will clarify imaging with a chest x-ray today -Levaquin today -Chest x-ray today -Lab work today-CBC and BMP -Patient to follow-up in 2 to 4 weeks to ensure that she there is clinical improvement

## 2018-04-29 ENCOUNTER — Telehealth: Payer: Self-pay | Admitting: Pulmonary Disease

## 2018-04-29 NOTE — Telephone Encounter (Signed)
Spoke with the pt  She states her pain on her left side seems worse today  She states Katie Alvarado had mentioned maybe wanting to order a ct chest  She is taking her abx as prescribed and she denies any new co's  Please advise if want to order CT ,thanks!

## 2018-04-29 NOTE — Progress Notes (Signed)
Labs stable. Keep follow up with our office.   Wyn Quaker FNP

## 2018-04-29 NOTE — Telephone Encounter (Signed)
Thank you. Agreed.  Katie Alvarado

## 2018-04-29 NOTE — Telephone Encounter (Signed)
Spoke with the pt  She states she has just noticed the pain on her left side is slightly worse  She states she is using her flutter valve multiple times per day and barely producing any sputum  I advised she should take her abx as prescribed and keep planned f/u with [facility] on 05/07/18  She was okay with this plan and no need to call her back unless you have any further recs for her  Thanks

## 2018-04-29 NOTE — Telephone Encounter (Signed)
Preferably patient will finish antibiotic treatment first then we could evaluate in office with Dr. Annamaria Boots.  I believe the patient has an upcoming appointment scheduled.    Our symptoms acutely worsening?  She coughing up more mucus?  Has she resume flutter valve use?  Wyn Quaker, FNP

## 2018-04-30 NOTE — Telephone Encounter (Signed)
Pt is calling back [phone]

## 2018-04-30 NOTE — Telephone Encounter (Signed)
Called and spoke with Patient.  Patient stated that someone called today, and she was unsure why.  Lauren had attempted to call with lab results.  Labs results given.  Understanding stated.  Nothing further at this time.

## 2018-05-04 ENCOUNTER — Encounter (HOSPITAL_COMMUNITY): Payer: Self-pay | Admitting: Emergency Medicine

## 2018-05-04 ENCOUNTER — Inpatient Hospital Stay (HOSPITAL_COMMUNITY)
Admission: EM | Admit: 2018-05-04 | Discharge: 2018-05-08 | DRG: 552 | Disposition: A | Discharge status: Home / Self Care | Payer: Medicare Other | Attending: Internal Medicine | Admitting: Internal Medicine

## 2018-05-04 ENCOUNTER — Emergency Department (HOSPITAL_COMMUNITY): Payer: Medicare Other

## 2018-05-04 ENCOUNTER — Other Ambulatory Visit: Payer: Self-pay

## 2018-05-04 DIAGNOSIS — K5730 Diverticulosis of large intestine without perforation or abscess without bleeding: Secondary | ICD-10-CM | POA: Diagnosis not present

## 2018-05-04 DIAGNOSIS — R110 Nausea: Secondary | ICD-10-CM | POA: Diagnosis not present

## 2018-05-04 DIAGNOSIS — Z888 Allergy status to other drugs, medicaments and biological substances status: Secondary | ICD-10-CM

## 2018-05-04 DIAGNOSIS — M5126 Other intervertebral disc displacement, lumbar region: Secondary | ICD-10-CM | POA: Diagnosis not present

## 2018-05-04 DIAGNOSIS — I10 Essential (primary) hypertension: Secondary | ICD-10-CM | POA: Diagnosis present

## 2018-05-04 DIAGNOSIS — M549 Dorsalgia, unspecified: Secondary | ICD-10-CM

## 2018-05-04 DIAGNOSIS — M48061 Spinal stenosis, lumbar region without neurogenic claudication: Secondary | ICD-10-CM

## 2018-05-04 DIAGNOSIS — J189 Pneumonia, unspecified organism: Secondary | ICD-10-CM | POA: Diagnosis not present

## 2018-05-04 DIAGNOSIS — J479 Bronchiectasis, uncomplicated: Secondary | ICD-10-CM

## 2018-05-04 DIAGNOSIS — R52 Pain, unspecified: Secondary | ICD-10-CM

## 2018-05-04 DIAGNOSIS — M5489 Other dorsalgia: Secondary | ICD-10-CM | POA: Diagnosis not present

## 2018-05-04 DIAGNOSIS — M109 Gout, unspecified: Secondary | ICD-10-CM | POA: Diagnosis present

## 2018-05-04 DIAGNOSIS — M1990 Unspecified osteoarthritis, unspecified site: Secondary | ICD-10-CM | POA: Diagnosis not present

## 2018-05-04 DIAGNOSIS — Z90710 Acquired absence of both cervix and uterus: Secondary | ICD-10-CM

## 2018-05-04 DIAGNOSIS — M545 Low back pain, unspecified: Secondary | ICD-10-CM | POA: Diagnosis present

## 2018-05-04 DIAGNOSIS — Z85828 Personal history of other malignant neoplasm of skin: Secondary | ICD-10-CM

## 2018-05-04 DIAGNOSIS — C001 Malignant neoplasm of external lower lip: Secondary | ICD-10-CM | POA: Diagnosis present

## 2018-05-04 DIAGNOSIS — K219 Gastro-esophageal reflux disease without esophagitis: Secondary | ICD-10-CM | POA: Diagnosis not present

## 2018-05-04 DIAGNOSIS — Z7951 Long term (current) use of inhaled steroids: Secondary | ICD-10-CM

## 2018-05-04 DIAGNOSIS — Z9841 Cataract extraction status, right eye: Secondary | ICD-10-CM

## 2018-05-04 DIAGNOSIS — Z923 Personal history of irradiation: Secondary | ICD-10-CM

## 2018-05-04 DIAGNOSIS — Z9842 Cataract extraction status, left eye: Secondary | ICD-10-CM

## 2018-05-04 DIAGNOSIS — G4700 Insomnia, unspecified: Secondary | ICD-10-CM | POA: Diagnosis present

## 2018-05-04 DIAGNOSIS — E1165 Type 2 diabetes mellitus with hyperglycemia: Secondary | ICD-10-CM

## 2018-05-04 DIAGNOSIS — M47816 Spondylosis without myelopathy or radiculopathy, lumbar region: Secondary | ICD-10-CM | POA: Diagnosis present

## 2018-05-04 DIAGNOSIS — Z885 Allergy status to narcotic agent status: Secondary | ICD-10-CM

## 2018-05-04 DIAGNOSIS — Z7984 Long term (current) use of oral hypoglycemic drugs: Secondary | ICD-10-CM

## 2018-05-04 DIAGNOSIS — Z825 Family history of asthma and other chronic lower respiratory diseases: Secondary | ICD-10-CM

## 2018-05-04 DIAGNOSIS — J440 Chronic obstructive pulmonary disease with acute lower respiratory infection: Secondary | ICD-10-CM | POA: Diagnosis not present

## 2018-05-04 DIAGNOSIS — G8929 Other chronic pain: Secondary | ICD-10-CM | POA: Diagnosis present

## 2018-05-04 DIAGNOSIS — D649 Anemia, unspecified: Secondary | ICD-10-CM | POA: Diagnosis present

## 2018-05-04 DIAGNOSIS — M4316 Spondylolisthesis, lumbar region: Secondary | ICD-10-CM | POA: Diagnosis present

## 2018-05-04 DIAGNOSIS — F419 Anxiety disorder, unspecified: Secondary | ICD-10-CM | POA: Diagnosis present

## 2018-05-04 DIAGNOSIS — E119 Type 2 diabetes mellitus without complications: Secondary | ICD-10-CM | POA: Diagnosis present

## 2018-05-04 DIAGNOSIS — M4726 Other spondylosis with radiculopathy, lumbar region: Secondary | ICD-10-CM | POA: Diagnosis not present

## 2018-05-04 LAB — URINALYSIS, ROUTINE W REFLEX MICROSCOPIC
Bacteria, UA: NONE SEEN
Bilirubin Urine: NEGATIVE
Glucose, UA: NEGATIVE mg/dL
HGB URINE DIPSTICK: NEGATIVE
Ketones, ur: NEGATIVE mg/dL
Nitrite: NEGATIVE
Protein, ur: NEGATIVE mg/dL
Specific Gravity, Urine: 1.006 (ref 1.005–1.030)
pH: 6 (ref 5.0–8.0)

## 2018-05-04 LAB — BASIC METABOLIC PANEL
Anion gap: 12 (ref 5–15)
BUN: 22 mg/dL (ref 8–23)
CO2: 23 mmol/L (ref 22–32)
Calcium: 8.7 mg/dL — ABNORMAL LOW (ref 8.9–10.3)
Chloride: 96 mmol/L — ABNORMAL LOW (ref 98–111)
Creatinine, Ser: 0.94 mg/dL (ref 0.44–1.00)
GFR calc Af Amer: >60 mL/min (ref >60)
GFR, EST NON AFRICAN AMERICAN: 53 mL/min — AB (ref >60)
Glucose, Bld: 167 mg/dL — ABNORMAL HIGH (ref 70–99)
Potassium: 3.9 mmol/L (ref 3.5–5.1)
Sodium: 131 mmol/L — ABNORMAL LOW (ref 135–145)

## 2018-05-04 LAB — CBC WITH DIFFERENTIAL/PLATELET
Abs Immature Granulocytes: 0.07 10*3/uL (ref 0.00–0.07)
Basophils Absolute: 0.1 10*3/uL (ref 0.0–0.1)
Basophils Relative: 1 %
EOS PCT: 0 %
Eosinophils Absolute: 0 10*3/uL (ref 0.0–0.5)
HCT: 33.7 % — ABNORMAL LOW (ref 36.0–46.0)
Hemoglobin: 10.8 g/dL — ABNORMAL LOW (ref 12.0–15.0)
Immature Granulocytes: 1 %
Lymphocytes Relative: 11 %
Lymphs Abs: 1.1 10*3/uL (ref 0.7–4.0)
MCH: 29.2 pg (ref 26.0–34.0)
MCHC: 32 g/dL (ref 30.0–36.0)
MCV: 91.1 fL (ref 80.0–100.0)
MONO ABS: 1.7 10*3/uL — AB (ref 0.1–1.0)
Monocytes Relative: 17 %
Neutro Abs: 7.4 10*3/uL (ref 1.7–7.7)
Neutrophils Relative %: 70 %
Platelets: 236 10*3/uL (ref 150–400)
RBC: 3.7 MIL/uL — ABNORMAL LOW (ref 3.87–5.11)
RDW: 13.1 % (ref 11.5–15.5)
WBC: 10.3 10*3/uL (ref 4.0–10.5)
nRBC: 0 % (ref 0.0–0.2)

## 2018-05-04 MED ORDER — MORPHINE SULFATE (PF) 4 MG/ML IV SOLN
4.0000 mg | Freq: Once | INTRAVENOUS | Status: AC
  Administered 2018-05-04: 4 mg via INTRAVENOUS
  Filled 2018-05-04: qty 1

## 2018-05-04 MED ORDER — DEXAMETHASONE SODIUM PHOSPHATE 10 MG/ML IJ SOLN: 10.0000 mg | Freq: Once | INTRAMUSCULAR | Status: DC

## 2018-05-04 NOTE — ED Notes (Signed)
MD at bedside. 

## 2018-05-04 NOTE — ED Triage Notes (Signed)
Sudden onset of low back pain x2 days denies event or injury denies dysuria OTC meds ineffective. Denies Hx . BP 142/70 Hr 90 Resp 18 Sat 95% ra CBG= 213

## 2018-05-04 NOTE — ED Notes (Signed)
Pt stating it hurts so much to move in the bed, even using the bedpan is too painful.

## 2018-05-04 NOTE — ED Provider Notes (Signed)
[place] DEPT Provider Note   CSN: [id number] Arrival date & time: 05/04/18  1907     History   Chief Complaint Chief Complaint  Patient presents with  . Back Pain    HPI Katie Alvarado is a 85 y.o. female.  Patient is a 85 year old female with past medical history of COPD, diabetes, hypertension.  She presents today with complaints of severe low back pain.  This began 3 days ago in the absence of any injury or trauma.  She is unable to sit up, stand, or walk secondary to the pain.  She denies any radiation into her legs.  She denies any bowel or bladder complaints.  She denies any numbness or tingling.  The history is provided by the patient.  Back Pain  Location:  Lumbar spine Quality:  Stabbing Radiates to:  Does not radiate Pain severity:  Severe Pain is:  Same all the time Onset quality:  Sudden Duration:  3 days Timing:  Constant Progression:  Unchanged Chronicity:  New Context: not falling   Relieved by:  Nothing Worsened by:  Palpation and ambulation   Past Medical History:  Diagnosis Date  . Acute nasopharyngitis (common cold)   . Arthritis   . Bronchiectasis with acute exacerbation ([place])   . Cancer ([place])    skin cancer  . COPD (chronic obstructive pulmonary disease) ([place])    sees dr. Annamaria Boots   . Cough   . Diabetes mellitus    fasting 130-150  . Dizziness   . Esophageal reflux   . History of radiation therapy 10/24/17- 12/05/17   Lower lip, 4.4 Gy X 10 fractions given twice weekly for a total dose of 44 Gy.  Marland Kitchen Hypertension   . Insomnia, unspecified   . Other symptoms involving nervous and musculoskeletal systems(781.99)   . Pneumonia    hx of  . Shortness of breath    exertion    Patient Active Problem List   Diagnosis Date Noted  . Pneumonia of left lower lobe due to infectious organism ([place]) 04/28/2018  . 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 Allergic urticaria 03/05/2016  . Gastritis 03/05/2016  .  Chronic rhinitis 01/09/2016  . Pruritus 09/05/2015  . 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 Anemia 07/18/2015  . Bronchiectasis without complication ([place]) 37/07/8887  . Renal artery stenosis ([place]) 04/25/2015  . Essential hypertension 04/25/2015  . Lower extremity pain, bilateral 08/04/2012  . Diarrhea 08/03/2012  . Klebsiella infection 08/01/2012  . DM type 2 (diabetes mellitus, type 2) ([place]) 07/29/2012  . Generalized weakness 07/29/2012  . Weakness 04/11/2011  . Hyponatremia 04/11/2011  . UTI (lower urinary tract infection) 04/11/2011  . ARF (acute renal failure) ([place]) 04/11/2011  . Leucocytosis 04/11/2011  . Dehydration 04/11/2011  . Hypotension 04/11/2011  . BRONCHIECTASIS, + [facility] 03/25/2010  . DIZZINESS 02/02/2010  . MUSCULOSKELETAL PAIN 09/25/2007  . INSOMNIA 05/20/2007  . ACUTE NASOPHARYNGITIS 04/20/2007  . Bronchiectasis with acute exacerbation ([place]) 04/20/2007  . Esophageal reflux 04/20/2007    Past Surgical History:  Procedure Laterality Date  . ABDOMINAL HYSTERECTOMY    . ANKLE FRACTURE SURGERY Left   . APPENDECTOMY    . BASAL CELL CARCINOMA EXCISION     NOSE  . CATARACT EXTRACTION, BILATERAL     OUT PATIENT  . COLON SURGERY     colon resection for diverticulitis  . LUMBAR LAMINECTOMY/DECOMPRESSION MICRODISCECTOMY N/A 02/15/2013   Procedure: LUMBAR LAMINECTOMY/DECOMPRESSION MICRODISCECTOMY LUMBAR FOUR-FIVE;  Surgeon: Otilio Connors,  MD;  Location: [place] NEURO ORS;  Service: Neurosurgery;  Laterality: N/A;  . VARICOSE VEIN SURGERY       OB History   No obstetric history on file.      Home Medications    Prior to Admission medications   Medication Sig Start Date End Date Taking? Authorizing Provider  acetaminophen (TYLENOL) 325 MG tablet Take 2 tablets (650 mg total) by mouth every 6 (six) hours as needed for pain. Patient taking differently: Take 325 mg by mouth every 6 (six) hours as needed for pain.  08/05/12   Eugenie Filler, MD    allopurinol (ZYLOPRIM) 100 MG tablet Take 1 tablet by mouth daily. 06/07/13   [provider]  ALPRAZolam Duanne Moron) 0.25 MG tablet Take 0.25 mg by mouth 2 (two) times daily as needed for sleep or anxiety. For anxiety.    [provider]  amLODipine (NORVASC) 2.5 MG tablet Take 2.5 mg by mouth daily.  12/19/15   [provider]  Artificial Tear Ointment (DRY EYES OP) Apply 1 drop to eye 2 (two) times daily as needed (for dry eyes).     [provider]  budesonide (PULMICORT) 0.25 MG/2ML nebulizer solution Take 2 mLs (0.25 mg total) by nebulization daily. 10/31/14   Baird Lyons D, MD  cholecalciferol (VITAMIN D) 1000 UNITS tablet Take 2,000 Units by mouth every morning.     [provider]  Cinnamon 500 MG capsule Take 500 mg by mouth every morning.     [provider]  esomeprazole (NEXIUM) 40 MG capsule Take 40 mg by mouth every morning.     [provider]  Fluticasone-Umeclidin-Vilant (TRELEGY ELLIPTA) 100-62.5-25 MCG/INH AEPB Inhale 1 puff into the lungs daily. 02/23/18   Baird Lyons D, MD  ipratropium (ATROVENT) 0.02 % nebulizer solution Take 2.5 mLs (500 mcg total) by nebulization 4 (four) times daily. 11/01/14   Deneise Lever, MD  levocetirizine (XYZAL) 5 MG tablet Take 1 tablet (5 mg total) by mouth daily as needed for allergies. 03/05/16   Bobbitt, Sed[place] Muta, MD  lidocaine (XYLOCAINE) 2 % solution Apply to lip and any other sore spots in mouth PRN. Do no ingest more than 20 mL daily. 11/14/17   Eppie Gibson, MD  losartan (COZAAR) 100 MG tablet Take 100 mg by mouth daily.  09/20/15   [provider]  meclizine (ANTIVERT) 25 MG tablet Take 25 mg by mouth every 6 (six) hours as needed for dizziness.  03/14/15   [provider]  metFORMIN (GLUCOPHAGE) 500 MG tablet Take 1,000 mg by mouth 2 (two) times daily.     [provider]  Omega-3 Fatty Acids (FISH OIL) 1000 MG CAPS Take 1 capsule by mouth daily.     [provider]  ondansetron (ZOFRAN) 4 MG tablet Take 1 tablet (4 mg total) by mouth every Monday, Wednesday, and Friday. With breakfast 09/05/15   Comer, Okey Regal, MD  ONE TOUCH ULTRA TEST test strip USE TO CHECK BLOOD SUGAR ONCE DAILY 90 01/04/16   [provider]  [facility] DELICA LANCETS 56L MISC USE TO CHECK BLOOD SUGAR ONCE DAILY AS DIRECTED 01/04/16   [provider]  PROAIR HFA 108 (90 Base) MCG/ACT inhaler INHALE 2 PUFFS INTO THE LUNGS EVERY 6 HOURS AS NEEDED FOR WHEEZING OR SHORTNESS OF BREATH 07/22/17   Deneise Lever, MD  Respiratory Therapy Supplies (FLUTTER) DEVI Use as directed 11/03/15   Deneise Lever, MD  traMADol (ULTRAM) 50 MG tablet Take  25 mg by mouth every 6 (six) hours as needed for moderate pain.     [provider]  vitamin E 400 UNIT capsule Take 400 Units by mouth daily.    [provider]    Family History Family History  Problem Relation Age of Onset  . Chronic bronchitis Father   . Other Sister        heart trouble  . Other Brother        heart trouble    Social History Social History   Tobacco Use  . Smoking status: Never Smoker  . Smokeless tobacco: Never Used  Substance Use Topics  . Alcohol use: Yes    Comment: "Hardly Ever"   . Drug use: No     Allergies   Welchol [colesevelam hcl]; Glucosamine; Tramadol; Celecoxib; and Statins   Review of Systems Review of Systems  Musculoskeletal: Positive for back pain.  All other systems reviewed and are negative.    Physical Exam Updated Vital Signs BP (!) 142/63 (BP Location: Right Arm)   Pulse 98   Temp 99.8 F (37.7 C) (Oral)   Resp (!) 24   SpO2 97%   Physical Exam Vitals signs and nursing note reviewed.  Constitutional:      General: She is not in acute distress.    Appearance: She is well-developed. She is not diaphoretic.  HENT:     Head: Normocephalic and atraumatic.  Neck:     Musculoskeletal: Normal range of motion and neck  supple.  Cardiovascular:     Rate and Rhythm: Normal rate and regular rhythm.     Heart sounds: No murmur. No friction rub. No gallop.   Pulmonary:     Effort: Pulmonary effort is normal. No respiratory distress.     Breath sounds: Normal breath sounds. No wheezing.  Abdominal:     General: Bowel sounds are normal. There is no distension.     Palpations: Abdomen is soft.     Tenderness: There is no abdominal tenderness.  Musculoskeletal: Normal range of motion.     Comments: There is ttp in the lumbar region, but no bony tenderness or stepoffs.  Skin:    General: Skin is warm and dry.  Neurological:     Mental Status: She is alert and oriented to person, place, and time.     Comments: DTRs are trace and symmetrical in both lower extremities.  Strength is 5 out of 5 in both lower extremities.  Sensation is intact throughout both lower extremities.      ED Treatments / Results  Labs (all labs ordered are listed, but only abnormal results are displayed) Labs Reviewed  BASIC METABOLIC PANEL  CBC WITH DIFFERENTIAL/PLATELET  URINALYSIS, ROUTINE W REFLEX MICROSCOPIC    EKG None  Radiology No results found.  Procedures Procedures (including critical care time)  Medications Ordered in ED Medications  morphine 4 MG/ML injection 4 mg (has no administration in time range)     Initial Impression / Assessment and Plan / ED Course  I have reviewed the triage vital signs and the nursing notes.  Pertinent labs & imaging results that were available during my care of the patient were reviewed by me and considered in my medical decision making (see chart for details).  Patient is a 85 year old female presenting with complaints of severe low back pain worsening over the past 3 days.  She is having pain radiating into her legs and difficulty ambulating.  CT scan obtained shows L4-5 disc  herniation with neural foraminal stenosis and compression of the nerve root at this level.  This  finding was discussed with Dr. Ellene Route from neurosurgery.  He is recommending steroids, an MRI in the morning, and admission to the hospitalist service for medical management and pain control.  I spoken with Dr. Hal Hope who agrees to admit.  Final Clinical Impressions(s) / ED Diagnoses   Final diagnoses:  None    ED Discharge Orders    None       Veryl Speak, MD 05/04/18 2340

## 2018-05-04 NOTE — ED Notes (Signed)
Bed: [facility] Expected date:  Expected time:  Means of arrival:  Comments: EMS-back pain 

## 2018-05-04 NOTE — ED Notes (Signed)
Provided patient water with permission from Dr. Keturah Barre. Katie Alvarado.

## 2018-05-05 ENCOUNTER — Encounter (HOSPITAL_COMMUNITY): Payer: Self-pay | Admitting: Internal Medicine

## 2018-05-05 ENCOUNTER — Observation Stay (HOSPITAL_COMMUNITY): Payer: Medicare Other

## 2018-05-05 ENCOUNTER — Other Ambulatory Visit: Payer: Self-pay

## 2018-05-05 DIAGNOSIS — M109 Gout, unspecified: Secondary | ICD-10-CM | POA: Diagnosis present

## 2018-05-05 DIAGNOSIS — M545 Pain in leg, unspecified: Secondary | ICD-10-CM

## 2018-05-05 DIAGNOSIS — E119 Type 2 diabetes mellitus without complications: Secondary | ICD-10-CM

## 2018-05-05 DIAGNOSIS — Z85828 Personal history of other malignant neoplasm of skin: Secondary | ICD-10-CM | POA: Diagnosis not present

## 2018-05-05 DIAGNOSIS — Z9842 Cataract extraction status, left eye: Secondary | ICD-10-CM | POA: Diagnosis not present

## 2018-05-05 DIAGNOSIS — M47816 Spondylosis without myelopathy or radiculopathy, lumbar region: Secondary | ICD-10-CM | POA: Diagnosis present

## 2018-05-05 DIAGNOSIS — Z825 Family history of asthma and other chronic lower respiratory diseases: Secondary | ICD-10-CM | POA: Diagnosis not present

## 2018-05-05 DIAGNOSIS — D649 Anemia, unspecified: Secondary | ICD-10-CM | POA: Diagnosis present

## 2018-05-05 DIAGNOSIS — Z7951 Long term (current) use of inhaled steroids: Secondary | ICD-10-CM | POA: Diagnosis not present

## 2018-05-05 DIAGNOSIS — Z7984 Long term (current) use of oral hypoglycemic drugs: Secondary | ICD-10-CM | POA: Diagnosis not present

## 2018-05-05 DIAGNOSIS — Z9841 Cataract extraction status, right eye: Secondary | ICD-10-CM | POA: Diagnosis not present

## 2018-05-05 DIAGNOSIS — M4807 Spinal stenosis, lumbosacral region: Secondary | ICD-10-CM | POA: Diagnosis not present

## 2018-05-05 DIAGNOSIS — G8929 Other chronic pain: Secondary | ICD-10-CM | POA: Diagnosis present

## 2018-05-05 DIAGNOSIS — M4316 Spondylolisthesis, lumbar region: Secondary | ICD-10-CM | POA: Diagnosis not present

## 2018-05-05 DIAGNOSIS — Z923 Personal history of irradiation: Secondary | ICD-10-CM | POA: Diagnosis not present

## 2018-05-05 DIAGNOSIS — K219 Gastro-esophageal reflux disease without esophagitis: Secondary | ICD-10-CM | POA: Diagnosis present

## 2018-05-05 DIAGNOSIS — M5126 Other intervertebral disc displacement, lumbar region: Secondary | ICD-10-CM | POA: Diagnosis present

## 2018-05-05 DIAGNOSIS — G4700 Insomnia, unspecified: Secondary | ICD-10-CM | POA: Diagnosis present

## 2018-05-05 DIAGNOSIS — M549 Dorsalgia, unspecified: Secondary | ICD-10-CM | POA: Diagnosis present

## 2018-05-05 DIAGNOSIS — I10 Essential (primary) hypertension: Secondary | ICD-10-CM | POA: Diagnosis present

## 2018-05-05 DIAGNOSIS — Z888 Allergy status to other drugs, medicaments and biological substances status: Secondary | ICD-10-CM | POA: Diagnosis not present

## 2018-05-05 DIAGNOSIS — J440 Chronic obstructive pulmonary disease with acute lower respiratory infection: Secondary | ICD-10-CM | POA: Diagnosis present

## 2018-05-05 DIAGNOSIS — M4726 Other spondylosis with radiculopathy, lumbar region: Secondary | ICD-10-CM | POA: Diagnosis not present

## 2018-05-05 DIAGNOSIS — Z885 Allergy status to narcotic agent status: Secondary | ICD-10-CM | POA: Diagnosis not present

## 2018-05-05 DIAGNOSIS — M1990 Unspecified osteoarthritis, unspecified site: Secondary | ICD-10-CM | POA: Diagnosis present

## 2018-05-05 DIAGNOSIS — F419 Anxiety disorder, unspecified: Secondary | ICD-10-CM | POA: Diagnosis present

## 2018-05-05 DIAGNOSIS — Z90710 Acquired absence of both cervix and uterus: Secondary | ICD-10-CM | POA: Diagnosis not present

## 2018-05-05 DIAGNOSIS — M48061 Spinal stenosis, lumbar region without neurogenic claudication: Secondary | ICD-10-CM | POA: Diagnosis not present

## 2018-05-05 LAB — CBC
HCT: 33.7 % — ABNORMAL LOW (ref 36.0–46.0)
Hemoglobin: 10.7 g/dL — ABNORMAL LOW (ref 12.0–15.0)
MCH: 29.5 pg (ref 26.0–34.0)
MCHC: 31.8 g/dL (ref 30.0–36.0)
MCV: 92.8 fL (ref 80.0–100.0)
Platelets: 228 10*3/uL (ref 150–400)
RBC: 3.63 MIL/uL — AB (ref 3.87–5.11)
RDW: 13.2 % (ref 11.5–15.5)
WBC: 11.1 10*3/uL — ABNORMAL HIGH (ref 4.0–10.5)
nRBC: 0 % (ref 0.0–0.2)

## 2018-05-05 LAB — BASIC METABOLIC PANEL
Anion gap: 13 (ref 5–15)
BUN: 18 mg/dL (ref 8–23)
CO2: 22 mmol/L (ref 22–32)
Calcium: 8.6 mg/dL — ABNORMAL LOW (ref 8.9–10.3)
Chloride: 96 mmol/L — ABNORMAL LOW (ref 98–111)
Creatinine, Ser: 0.91 mg/dL (ref 0.44–1.00)
GFR calc Af Amer: >60 mL/min (ref >60)
GFR calc non Af Amer: 56 mL/min — ABNORMAL LOW (ref >60)
Glucose, Bld: 140 mg/dL — ABNORMAL HIGH (ref 70–99)
Potassium: 3.9 mmol/L (ref 3.5–5.1)
Sodium: 131 mmol/L — ABNORMAL LOW (ref 135–145)

## 2018-05-05 LAB — GLUCOSE, CAPILLARY
Glucose-Capillary: 176 mg/dL — ABNORMAL HIGH (ref 70–99)
Glucose-Capillary: 189 mg/dL — ABNORMAL HIGH (ref 70–99)
Glucose-Capillary: 300 mg/dL — ABNORMAL HIGH (ref 70–99)

## 2018-05-05 MED ORDER — LOSARTAN POTASSIUM 50 MG PO TABS
100.0000 mg | ORAL_TABLET | Freq: Every day | ORAL | Status: DC
  Administered 2018-05-05 – 2018-05-08 (×4): 100 mg via ORAL
  Filled 2018-05-05 (×5): qty 2

## 2018-05-05 MED ORDER — BUDESONIDE 0.25 MG/2ML IN SUSP
0.2500 mg | Freq: Two times a day (BID) | RESPIRATORY_TRACT | Status: DC
  Administered 2018-05-05 – 2018-05-08 (×6): 0.25 mg via RESPIRATORY_TRACT
  Filled 2018-05-05 (×6): qty 2

## 2018-05-05 MED ORDER — INSULIN ASPART 100 UNIT/ML ~~LOC~~ SOLN: 5.0000 [IU] | Freq: Once | SUBCUTANEOUS | Status: DC

## 2018-05-05 MED ORDER — DEXAMETHASONE SODIUM PHOSPHATE 10 MG/ML IJ SOLN
4.0000 mg | INTRAMUSCULAR | Status: DC
  Administered 2018-05-05 – 2018-05-08 (×4): 4 mg via INTRAVENOUS
  Filled 2018-05-05 (×4): qty 1

## 2018-05-05 MED ORDER — LIDOCAINE 5 % EX PTCH
1.0000 | MEDICATED_PATCH | Freq: Every day | CUTANEOUS | Status: DC
  Administered 2018-05-05 – 2018-05-08 (×4): 1 via TRANSDERMAL
  Filled 2018-05-05 (×4): qty 1

## 2018-05-05 MED ORDER — ONDANSETRON HCL 4 MG/2ML IJ SOLN: 4.0000 mg | Freq: Four times a day (QID) | INTRAMUSCULAR | Status: DC | PRN

## 2018-05-05 MED ORDER — AMLODIPINE BESYLATE 5 MG PO TABS
2.5000 mg | ORAL_TABLET | Freq: Every day | ORAL | Status: DC
  Administered 2018-05-05 – 2018-05-08 (×4): 2.5 mg via ORAL
  Filled 2018-05-05 (×4): qty 1

## 2018-05-05 MED ORDER — PANTOPRAZOLE SODIUM 40 MG PO TBEC
40.0000 mg | DELAYED_RELEASE_TABLET | Freq: Every day | ORAL | Status: DC
  Administered 2018-05-05 – 2018-05-08 (×4): 40 mg via ORAL
  Filled 2018-05-05 (×4): qty 1

## 2018-05-05 MED ORDER — OMEGA-3-ACID ETHYL ESTERS 1 G PO CAPS
1.0000 g | ORAL_CAPSULE | Freq: Every day | ORAL | Status: DC
  Administered 2018-05-06 – 2018-05-08 (×3): 1 g via ORAL
  Filled 2018-05-05 (×5): qty 1

## 2018-05-05 MED ORDER — MORPHINE SULFATE (PF) 2 MG/ML IV SOLN
0.5000 mg | INTRAVENOUS | Status: DC | PRN
  Administered 2018-05-05 – 2018-05-06 (×6): 0.5 mg via INTRAVENOUS
  Filled 2018-05-05 (×6): qty 1

## 2018-05-05 MED ORDER — ALLOPURINOL 100 MG PO TABS
100.0000 mg | ORAL_TABLET | Freq: Every day | ORAL | Status: DC
  Administered 2018-05-06 – 2018-05-07 (×2): 100 mg via ORAL
  Filled 2018-05-05 (×5): qty 1

## 2018-05-05 MED ORDER — ACETAMINOPHEN 325 MG PO TABS
650.0000 mg | ORAL_TABLET | Freq: Four times a day (QID) | ORAL | Status: DC | PRN
  Administered 2018-05-08: 650 mg via ORAL
  Filled 2018-05-05: qty 2

## 2018-05-05 MED ORDER — UMECLIDINIUM-VILANTEROL 62.5-25 MCG/INH IN AEPB
1.0000 | INHALATION_SPRAY | Freq: Every day | RESPIRATORY_TRACT | Status: DC
  Administered 2018-05-06 – 2018-05-08 (×3): 1 via RESPIRATORY_TRACT
  Filled 2018-05-05: qty 14

## 2018-05-05 MED ORDER — ALPRAZOLAM 0.25 MG PO TABS
0.2500 mg | ORAL_TABLET | Freq: Two times a day (BID) | ORAL | Status: DC | PRN
  Administered 2018-05-06 – 2018-05-08 (×2): 0.25 mg via ORAL
  Filled 2018-05-05 (×2): qty 1

## 2018-05-05 MED ORDER — ONDANSETRON HCL 4 MG PO TABS: 4.0000 mg | ORAL_TABLET | Freq: Four times a day (QID) | ORAL | Status: DC | PRN

## 2018-05-05 MED ORDER — TRAMADOL HCL 50 MG PO TABS
50.0000 mg | ORAL_TABLET | Freq: Four times a day (QID) | ORAL | Status: DC | PRN
  Administered 2018-05-06 – 2018-05-07 (×2): 50 mg via ORAL
  Filled 2018-05-05 (×2): qty 1

## 2018-05-05 MED ORDER — INSULIN ASPART 100 UNIT/ML ~~LOC~~ SOLN
0.0000 [IU] | Freq: Three times a day (TID) | SUBCUTANEOUS | Status: DC
  Administered 2018-05-05: 2 [IU] via SUBCUTANEOUS
  Administered 2018-05-05: 5 [IU] via SUBCUTANEOUS
  Administered 2018-05-05: 2 [IU] via SUBCUTANEOUS
  Administered 2018-05-06: 7 [IU] via SUBCUTANEOUS
  Administered 2018-05-06: 3 [IU] via SUBCUTANEOUS
  Administered 2018-05-06: 1 [IU] via SUBCUTANEOUS
  Administered 2018-05-07: 3 [IU] via SUBCUTANEOUS
  Administered 2018-05-07: 1 [IU] via SUBCUTANEOUS
  Administered 2018-05-07: 3 [IU] via SUBCUTANEOUS
  Administered 2018-05-08: 1 [IU] via SUBCUTANEOUS

## 2018-05-05 MED ORDER — ACETAMINOPHEN 650 MG RE SUPP: 650.0000 mg | Freq: Four times a day (QID) | RECTAL | Status: DC | PRN

## 2018-05-05 MED ORDER — MECLIZINE HCL 25 MG PO TABS: 25.0000 mg | ORAL_TABLET | Freq: Four times a day (QID) | ORAL | Status: DC | PRN

## 2018-05-05 NOTE — ED Notes (Signed)
Report given to 4E. Patient is in MRI. Will transport patient upstairs once back from MRI.

## 2018-05-05 NOTE — Care Management Obs Status (Signed)
[facility] NOTIFICATION   Patient Details  Name: Katie Alvarado MRN: [id number] Date of Birth: 08-Oct-1925   Medicare Observation Status Notification Given:  Yes    MahabirJuliann Pulse, RN 05/05/2018, 3:48 PM

## 2018-05-05 NOTE — ED Notes (Signed)
Patient transported to MRI 

## 2018-05-05 NOTE — H&P (Signed)
History and Physical    Katie Alvarado HCW:[id number] DOB: [birth year] DOA: 05/04/2018  PCP: Hulan Fess, MD  Patient coming from: Home.  Chief Complaint: Back pain.  HPI: Katie Alvarado is a 85 y.o. female with history of bronchiectasis, hypertension, diabetes mellitus, GERD, anxiety who was recently treated for pneumonia last dose of antibiotic was yesterday has been experiencing sudden onset of severe pain over the last 3 days.  Pain is mostly in the low back radiating to her left lower extremity.  Unable to ambulate because of the pain.  Denies any incontinence of urine or bowel.  Denies any fall.  Denies any fever chills.  ED Course: In the ER patient had CT lumbar spine done which shows lumbar spondylosis with disc protrusion impinging on the exiting L4 on the right side causing severe foraminal stenosis.  Dr. Ellene Route on-call neurosurgeon was consulted.  Dr. Meda Coffee advised to give Decadron and get MRI in the morning.  And to reconsult after MRI results.  Review of Systems: As per HPI, rest all negative.   Past Medical History:  Diagnosis Date  . Acute nasopharyngitis (common cold)   . Arthritis   . Bronchiectasis [place] exacerbation ([place])   . Cancer ([place])    skin cancer  . COPD (chronic obstructive pulmonary disease) ([place])    sees dr. Annamaria Boots   . Cough   . Diabetes mellitus    fasting 130-150  . Dizziness   . Esophageal reflux   . History of radiation therapy 10/24/17- 12/05/17   Lower lip, 4.4 Gy X 10 fractions given twice weekly for a total dose of 44 Gy.  Marland Kitchen Hypertension   . Insomnia, unspecified   . Other symptoms involving nervous and musculoskeletal systems(781.99)   . Pneumonia    hx of  . Shortness of breath    exertion    Past Surgical History:  Procedure Laterality Date  . ABDOMINAL HYSTERECTOMY    . ANKLE FRACTURE SURGERY Left   . APPENDECTOMY    . BASAL CELL CARCINOMA EXCISION     NOSE  . CATARACT EXTRACTION, BILATERAL     OUT PATIENT  . COLON  SURGERY     colon resection for diverticulitis  . LUMBAR LAMINECTOMY/DECOMPRESSION MICRODISCECTOMY N/A 02/15/2013   Procedure: LUMBAR LAMINECTOMY/DECOMPRESSION MICRODISCECTOMY LUMBAR FOUR-FIVE;  Surgeon: Otilio Connors, MD;  Location: [facility];  Service: Neurosurgery;  Laterality: N/A;  . VARICOSE VEIN SURGERY       reports that she has never smoked. She has never used smokeless tobacco. She reports current alcohol use. She reports that she does not use drugs.  Allergies  Allergen Reactions  . Welchol [Colesevelam Hcl] Hives  . Glucosamine Other (See Comments)    Unknown  . Tramadol Nausea Only  . Celecoxib Hives, Itching and Rash  . Statins Rash    Family History  Problem Relation Age of Onset  . Chronic bronchitis Father   . Other Sister        heart trouble  . Other Brother        heart trouble    Prior to Admission medications   Medication Sig Start Date End Date Taking? Authorizing Provider  acetaminophen (TYLENOL) 325 MG tablet Take 2 tablets (650 mg total) by mouth every 6 (six) hours as needed for pain. Patient taking differently: Take 325 mg by mouth every 6 (six) hours as needed for pain.  08/05/12   Eugenie Filler, MD  allopurinol (ZYLOPRIM) 100 MG tablet Take  1 tablet by mouth daily. 06/07/13   [provider]  ALPRAZolam Duanne Moron) 0.25 MG tablet Take 0.25 mg by mouth 2 (two) times daily as needed for sleep or anxiety. For anxiety.    [provider]  amLODipine (NORVASC) 2.5 MG tablet Take 2.5 mg by mouth daily.  12/19/15   [provider]  Artificial Tear Ointment (DRY EYES OP) Apply 1 drop to eye 2 (two) times daily as needed (for dry eyes).     [provider]  budesonide (PULMICORT) 0.25 MG/2ML nebulizer solution Take 2 mLs (0.25 mg total) by nebulization daily. 10/31/14   Baird Lyons D, MD  cholecalciferol (VITAMIN D) 1000 UNITS tablet Take 2,000 Units by mouth every morning.     [provider]  Cinnamon 500 MG  capsule Take 500 mg by mouth every morning.     [provider]  esomeprazole (NEXIUM) 40 MG capsule Take 40 mg by mouth every morning.     [provider]  Fluticasone-Umeclidin-Vilant (TRELEGY ELLIPTA) 100-62.5-25 MCG/INH AEPB Inhale 1 puff into the lungs daily. 02/23/18   Baird Lyons D, MD  ipratropium (ATROVENT) 0.02 % nebulizer solution Take 2.5 mLs (500 mcg total) by nebulization 4 (four) times daily. 11/01/14   Deneise Lever, MD  levocetirizine (XYZAL) 5 MG tablet Take 1 tablet (5 mg total) by mouth daily as needed for allergies. 03/05/16   Bobbitt, [place] Muta, MD  lidocaine (XYLOCAINE) 2 % solution Apply to lip and any other sore spots in mouth PRN. Do no ingest more than 20 mL daily. 11/14/17   Eppie Gibson, MD  losartan (COZAAR) 100 MG tablet Take 100 mg by mouth daily.  09/20/15   [provider]  meclizine (ANTIVERT) 25 MG tablet Take 25 mg by mouth every 6 (six) hours as needed for dizziness.  03/14/15   [provider]  metFORMIN (GLUCOPHAGE) 500 MG tablet Take 1,000 mg by mouth 2 (two) times daily.     [provider]  Omega-3 Fatty Acids (FISH OIL) 1000 MG CAPS Take 1 capsule by mouth daily.    [provider]  ondansetron (ZOFRAN) 4 MG tablet Take 1 tablet (4 mg total) by mouth every Monday, Wednesday, and Friday. With breakfast 09/05/15   Comer, Okey Regal, MD  ONE TOUCH ULTRA TEST test strip USE TO CHECK BLOOD SUGAR ONCE DAILY 90 01/04/16   [provider]  [facility] DELICA LANCETS 40J MISC USE TO CHECK BLOOD SUGAR ONCE DAILY AS DIRECTED 01/04/16   [provider]  PROAIR HFA 108 (90 Base) MCG/ACT inhaler INHALE 2 PUFFS INTO THE LUNGS EVERY 6 HOURS AS NEEDED FOR WHEEZING OR SHORTNESS OF BREATH 07/22/17   Deneise Lever, MD  Respiratory Therapy Supplies (FLUTTER) DEVI Use as directed 11/03/15   Deneise Lever, MD  traMADol (ULTRAM) 50 MG tablet Take 25 mg by mouth every 6 (six) hours as needed for moderate pain.      [provider]  vitamin E 400 UNIT capsule Take 400 Units by mouth daily.    [provider]    Physical Exam: Vitals:   05/04/18 1917 05/04/18 1928 05/04/18 2005  BP:  (!) 148/78 (!) 142/63  Pulse:  (!) 103 98  Resp:  18 (!) 24  Temp:  99.8 F (37.7 C)   TempSrc:  Oral   SpO2: 95% 97% 97%      Constitutional: Moderately built and nourished. Vitals:   05/04/18 1917 05/04/18 1928 05/04/18 2005  BP:  Marland Kitchen)  148/78 (!) 142/63  Pulse:  (!) 103 98  Resp:  18 (!) 24  Temp:  99.8 F (37.7 C)   TempSrc:  Oral   SpO2: 95% 97% 97%   Eyes: Anicteric no pallor. ENMT: No discharge from the ears eyes nose or mouth. Neck: No mass felt.  No neck rigidity. Respiratory: No rhonchi or crepitations. Cardiovascular: S1-S2 heard. Abdomen: Soft nontender bowel sounds present. Musculoskeletal: No edema.  No joint effusion.  Pain on moving lower extremities.  Pain is mostly in the low back. Skin: No rash. Neurologic: Alert awake oriented to time place and person.  Moves all extremities. Psychiatric: Appears normal.   Labs on Admission: I have personally reviewed following labs and imaging studies  CBC: Recent Labs  Lab 04/28/18 1010 05/04/18 2033  WBC 12.3* 10.3  NEUTROABS 10.2* 7.4  HGB 12.2 10.8*  HCT 37.2 33.7*  MCV 88.9 91.1  PLT 476.0* 376   Basic Metabolic Panel: Recent Labs  Lab 04/28/18 1010 05/04/18 2033  NA 133* 131*  K 4.1 3.9  CL 94* 96*  CO2 29 23  GLUCOSE 102* 167*  BUN 24* 22  CREATININE 0.89 0.94  CALCIUM 9.9 8.7*   GFR: Estimated Creatinine Clearance: 40.1 mL/min (by C-G formula based on SCr of 0.94 mg/dL). Liver Function Tests: No results for input(s): AST, ALT, ALKPHOS, BILITOT, PROT, ALBUMIN in the last 168 hours. No results for input(s): LIPASE, AMYLASE in the last 168 hours. No results for input(s): AMMONIA in the last 168 hours. Coagulation Profile: No results for input(s): INR, PROTIME in the last 168 hours. Cardiac  Enzymes: No results for input(s): CKTOTAL, CKMB, CKMBINDEX, TROPONINI in the last 168 hours. BNP (last 3 results) No results for input(s): PROBNP in the last 8760 hours. HbA1C: No results for input(s): HGBA1C in the last 72 hours. CBG: No results for input(s): GLUCAP in the last 168 hours. Lipid Profile: No results for input(s): CHOL, HDL, LDLCALC, TRIG, CHOLHDL, LDLDIRECT in the last 72 hours. Thyroid Function Tests: No results for input(s): TSH, T4TOTAL, FREET4, T3FREE, THYROIDAB in the last 72 hours. Anemia Panel: No results for input(s): VITAMINB12, FOLATE, FERRITIN, TIBC, IRON, RETICCTPCT in the last 72 hours. Urine analysis:    Component Value Date/Time   COLORURINE STRAW (A) 05/04/2018 2033   APPEARANCEUR CLEAR 05/04/2018 2033   LABSPEC 1.006 05/04/2018 2033   PHURINE 6.0 05/04/2018 2033   GLUCOSEU NEGATIVE 05/04/2018 2033   HGBUR NEGATIVE 05/04/2018 2033   BILIRUBINUR NEGATIVE 05/04/2018 2033   KETONESUR NEGATIVE 05/04/2018 2033   PROTEINUR NEGATIVE 05/04/2018 2033   UROBILINOGEN 0.2 02/09/2013 1315   NITRITE NEGATIVE 05/04/2018 2033   LEUKOCYTESUR SMALL (A) 05/04/2018 2033   Sepsis Labs: @LABRCNTIP (procalcitonin:4,lacticidven:4) )No results found for this or any previous visit (from the past 240 hour(s)).   Radiological Exams on Admission: Ct Lumbar Spine Wo Contrast  Result Date: 05/04/2018 CLINICAL DATA:  85 y/o F; sudden onset of lower back pain for 2 days. Pain radiates to the left lower extremity. EXAM: CT LUMBAR SPINE WITHOUT CONTRAST TECHNIQUE: Multidetector CT imaging of the lumbar spine was performed without intravenous contrast administration. Multiplanar CT image reconstructions were also generated. COMPARISON:  06/19/2016 lumbar spine radiographs. Concurrent CT pelvis. 09/21/2014 lumbar spine myelogram. FINDINGS: Segmentation: 5 lumbar type vertebrae. Alignment: Mild lumbar levocurvature with apex at L4. L4-5 grade 1 anterolisthesis without pars defect.  Vertebrae: No acute fracture or focal pathologic process. Chronic postsurgical changes related to L4-5 laminectomy. Hemilaminectomy. Paraspinal and other soft tissues: Negative. Disc levels:  Moderate spondylosis of the lumbar spine with loss of intervertebral disc space height greatest at the L2-3 and L5-S1 levels. Prominent lower lumbar facet arthrosis. At the right L4-5 level there is a foraminal disc protrusion impinging the exiting L4 nerve root and resulting in severe foraminal stenosis. Mild-to-moderate foraminal stenosis at additional levels. Additionally, at L4-5 there is multifactorial mild-to-moderate spinal canal stenosis. No high-grade spinal canal stenosis identified. IMPRESSION: 1. No acute osseous abnormality identified. 2. Moderate lumbar spondylosis greatest the L4-5 level, stable from prior lumbar spine myelogram given differences in technique. 3. Right L4-5 foraminal disc protrusion impinging the exiting L4 nerve root and resulting in severe foraminal stenosis. 4. Mild-to-moderate spinal canal stenosis at L4-5. Electronically Signed   By: Kristine Garbe M.D.   On: 05/04/2018 22:31   Ct Pelvis Wo Contrast  Result Date: 05/04/2018 CLINICAL DATA:  85 y/o F; new onset lower back pain for 2 days. Lower back and pelvic pain radiates to the left. EXAM: CT PELVIS WITHOUT CONTRAST TECHNIQUE: Multidetector CT imaging of the pelvis was performed following the standard protocol without intravenous contrast. COMPARISON:  05/04/2018 lumbar spine CT. FINDINGS: Urinary Tract:  No abnormality visualized. Bowel: Appendectomy. Sigmoid diverticulosis without findings of diverticulitis. Vascular/Lymphatic: No pathologically enlarged lymph nodes. Aortic and iliofemoral calcific atherosclerosis. Reproductive:  Hysterectomy. Other:  None. Musculoskeletal: Hip joints and sacroiliac joints are well maintained. Degenerative changes of the symphysis pubis with osteophytosis and sclerosis of the articular  surfaces. No acute fracture identified. IMPRESSION: 1. No acute fracture or dislocation identified. 2. Sigmoid diverticulosis without findings of diverticulitis. 3. Aortic and iliofemoral calcific atherosclerosis. Electronically Signed   By: Kristine Garbe M.D.   On: 05/04/2018 22:41      Assessment/Plan Principal Problem:   Low back pain radiating down leg Active Problems:   DM type 2 (diabetes mellitus, type 2) (HCC)   Bronchiectasis without complication (HCC)   Essential hypertension   Malignant neoplasm of lower lip   Back pain   Low back pain    1. Low back pain secondary to severe foraminal stenosis at the L4 -neurosurgeon Dr. Ileana Roup was advised to get MRI of L-spine which has been ordered.  Patient is on pain relief medication and Decadron. 2. Normocytic normochromic anemia appears to be new.  Follow CBC. 3. Diabetes mellitus type 2 we will keep patient on sliding scale coverage. 4. History of bronchiectasis with recent treatment for pneumonia.  Continue inhalers. 5. Hypertension on Cozaar and hydrochlorothiazide. 6. History of gout on allopurinol. 7. Anxiety on Xanax as needed.   DVT prophylaxis: SCDs in anticipation of procedure. Code Status: Full code. Family Communication: No family at the bedside. Disposition Plan: Probably will need rehab. Consults called: ER physician discussed with Dr. Ellene Route neurosurgeon. Admission status: Observation.   Rise Patience MD [facility] Pager [phone].  If 7PM-7AM, please contact night-coverage [email or web address] Password [facility]  05/05/2018, 12:22 AM

## 2018-05-05 NOTE — Progress Notes (Signed)
PT Cancellation Note  Patient Details Name: NARELY NOBLES MRN: [id number] DOB: 03-10-1926   Cancelled Treatment:    Reason Eval/Treat Not Completed: Patient not medically ready, Neurosurgery to evaluate after MRI. Will check back when  Cleared.  [place] Pager [phone] Office [phone]   Claretha Cooper 05/05/2018, 8:20 AM

## 2018-05-05 NOTE — Progress Notes (Addendum)
Katie Alvarado is a 85 y.o. female with history of bronchiectasis, hypertension, diabetes mellitus, GERD, anxiety who was recently treated for pneumonia last dose of antibiotic was yesterday has been experiencing sudden onset of severe pain over the last 3 days.  Pain is mostly in the low back radiating to her left lower extremity.  Unable to ambulate because of the pain.  Denies any incontinence of urine or bowel.  Denies any fall.  Denies any fever chills.  In the ER patient had CT lumbar spine done which shows lumbar spondylosis with disc protrusion impinging on the exiting L4 on the right side causing severe foraminal stenosis.  Dr. Ellene Route on-call neurosurgeon was consulted.  Dr. Meda Coffee advised to give Decadron and get MRI in the morning.  And to reconsult after MRI results.\  05/05/2018: Patient seen and examined with her daughter at bedside.  She reports significant pain in her right hip improved with IV morphine.  Also reports pain in her left shoulder which is chronic, 5 years.  MRI lumbar spine done on 05/05/2018 revealed L4-L5 anterolisthesis, L4-L5 severe right foraminal impingement with mild to moderate spinal stenosis, L5-S1 moderate bilateral foraminal stenosis.  Patient is on IV Decadron 4 mg daily.   Refer to H&P dictated by Dr. Hal Hope on 05/05/2018 for further details of the assessment and plan.  Update: [facility] on 05/05/18 to inform MRI lumbar spine results are available. C/w IV Decadron and pain management until we hear back from neurosurgery, Dr Ellene Route. Highly appreciated. Recommends IR translaminar epidural steroid injection at L4-L5. Order is in. Highly appreciate recommendations and assistance in the management of this patient.

## 2018-05-06 LAB — GLUCOSE, CAPILLARY
Glucose-Capillary: 222 mg/dL — ABNORMAL HIGH (ref 70–99)
Glucose-Capillary: 316 mg/dL — ABNORMAL HIGH (ref 70–99)
Glucose-Capillary: 191 mg/dL — ABNORMAL HIGH (ref 70–99)

## 2018-05-06 NOTE — Consult Note (Addendum)
Reason for Consult: Unremitting back pain Referring Physician: Dr. Francia Greaves  Katie Alvarado is an 85 y.o. female.  HPI: Katie Alvarado is a 85 year old individual who has developed severe and unremitting back pain that is become intractable.  She is seen in the emergency department plain x-rays demonstrated advanced spondylitic disease with evidence suggesting significant stenosis at L4-L5.  An MRI confirms the presence of moderate central canal stenosis with severe lateral recess stenosis bilaterally.  This is at the level of L4-L5.  She has other spondylitic changes also but this is clearly most severe.  No acute change however is noted on the MRI suggesting an recent disc herniation.  The hip joints appear to be intact on the CT scan.  I was contacted regarding help to control the patient's pain and evaluate her spine further.  She did have a dose of steroid medication this has yielded minimal relief.  Past Medical History:  Diagnosis Date  . Acute nasopharyngitis (common cold)   . Arthritis   . Bronchiectasis with acute exacerbation ([place])   . Cancer ([place])    skin cancer  . COPD (chronic obstructive pulmonary disease) ([place])    sees dr. Annamaria Boots   . Cough   . Diabetes mellitus    fasting 130-150  . Dizziness   . Esophageal reflux   . History of radiation therapy 10/24/17- 12/05/17   Lower lip, 4.4 Gy X 10 fractions given twice weekly for a total dose of 44 Gy.  Marland Kitchen Hypertension   . Insomnia, unspecified   . Other symptoms involving nervous and musculoskeletal systems(781.99)   . Pneumonia    hx of  . Shortness of breath    exertion    Past Surgical History:  Procedure Laterality Date  . ABDOMINAL HYSTERECTOMY    . ANKLE FRACTURE SURGERY Left   . APPENDECTOMY    . BASAL CELL CARCINOMA EXCISION     NOSE  . CATARACT EXTRACTION, BILATERAL     OUT PATIENT  . COLON SURGERY     colon resection for diverticulitis  . LUMBAR LAMINECTOMY/DECOMPRESSION MICRODISCECTOMY N/A 02/15/2013    Procedure: LUMBAR LAMINECTOMY/DECOMPRESSION MICRODISCECTOMY LUMBAR FOUR-FIVE;  Surgeon: Otilio Connors, MD;  Location: [facility];  Service: Neurosurgery;  Laterality: N/A;  . VARICOSE VEIN SURGERY      Family History  Problem Relation Age of Onset  . Chronic bronchitis Father   . Other Sister        heart trouble  . Other Brother        heart trouble    Social History:  reports that she has never smoked. She has never used smokeless tobacco. She reports current alcohol use. She reports that she does not use drugs.  Allergies:  Allergies  Allergen Reactions  . Welchol [Colesevelam Hcl] Hives  . Glucosamine Other (See Comments)    Unknown  . Tramadol Nausea Only  . Celecoxib Hives, Itching and Rash  . Statins Rash    Medications: I have reviewed the patient's current medications.  Results for orders placed or performed during the hospital encounter of 05/04/18 (from the past 48 hour(s))  Basic metabolic panel     Status: Abnormal   Collection Time: 05/04/18  8:33 PM  Result Value Ref Range   Sodium 131 (L) 135 - 145 mmol/L   Potassium 3.9 3.5 - 5.1 mmol/L   Chloride 96 (L) 98 - 111 mmol/L   CO2 23 22 - 32 mmol/L   Glucose, Bld 167 (H) 70 -  99 mg/dL   BUN 22 8 - 23 mg/dL   Creatinine, Ser 0.94 0.44 - 1.00 mg/dL   Calcium 8.7 (L) 8.9 - 10.3 mg/dL   GFR calc non Af Amer 53 (L) >60 mL/min   GFR calc Af Amer >60 >60 mL/min   Anion gap 12 5 - 15    Comment: Performed at [facility], [place] [address] [place], [place] [address]  CBC with Differential     Status: Abnormal   Collection Time: 05/04/18  8:33 PM  Result Value Ref Range   WBC 10.3 4.0 - 10.5 K/uL   RBC 3.70 (L) 3.87 - 5.11 MIL/uL   Hemoglobin 10.8 (L) 12.0 - 15.0 g/dL   HCT 33.7 (L) 36.0 - 46.0 %   MCV 91.1 80.0 - 100.0 fL   MCH 29.2 26.0 - 34.0 pg   MCHC 32.0 30.0 - 36.0 g/dL   RDW 13.1 11.5 - 15.5 %   Platelets 236 150 - 400 K/uL   nRBC 0.0 0.0 - 0.2 %   Neutrophils Relative % 70 %    Neutro Abs 7.4 1.7 - 7.7 K/uL   Lymphocytes Relative 11 %   Lymphs Abs 1.1 0.7 - 4.0 K/uL   Monocytes Relative 17 %   Monocytes Absolute 1.7 (H) 0.1 - 1.0 K/uL   Eosinophils Relative 0 %   Eosinophils Absolute 0.0 0.0 - 0.5 K/uL   Basophils Relative 1 %   Basophils Absolute 0.1 0.0 - 0.1 K/uL   Immature Granulocytes 1 %   Abs Immature Granulocytes 0.07 0.00 - 0.07 K/uL    Comment: Performed at [facility], [place] [address] [place], [place] [address]  Urinalysis, Routine w reflex microscopic     Status: Abnormal   Collection Time: 05/04/18  8:33 PM  Result Value Ref Range   Color, Urine STRAW (A) YELLOW   APPearance CLEAR CLEAR   Specific Gravity, Urine 1.006 1.005 - 1.030   pH 6.0 5.0 - 8.0   Glucose, UA NEGATIVE NEGATIVE mg/dL   Hgb urine dipstick NEGATIVE NEGATIVE   Bilirubin Urine NEGATIVE NEGATIVE   Ketones, ur NEGATIVE NEGATIVE mg/dL   Protein, ur NEGATIVE NEGATIVE mg/dL   Nitrite NEGATIVE NEGATIVE   Leukocytes, UA SMALL (A) NEGATIVE   RBC / HPF 0-5 0 - 5 RBC/hpf   WBC, UA 11-20 0 - 5 WBC/hpf   Bacteria, UA NONE SEEN NONE SEEN   Squamous Epithelial / LPF 0-5 0 - 5    Comment: Performed at [facility] [place] [address] [place], [place] [address]  Basic metabolic panel     Status: Abnormal   Collection Time: 05/05/18  4:44 AM  Result Value Ref Range   Sodium 131 (L) 135 - 145 mmol/L   Potassium 3.9 3.5 - 5.1 mmol/L   Chloride 96 (L) 98 - 111 mmol/L   CO2 22 22 - 32 mmol/L   Glucose, Bld 140 (H) 70 - 99 mg/dL   BUN 18 8 - 23 mg/dL   Creatinine, Ser 0.91 0.44 - 1.00 mg/dL   Calcium 8.6 (L) 8.9 - 10.3 mg/dL   GFR calc non Af Amer 56 (L) >60 mL/min   GFR calc Af Amer >60 >60 mL/min   Anion gap 13 5 - 15    Comment: Performed at [facility], [place] [address] [place], [place] [address]  CBC     Status: Abnormal   Collection Time: 05/05/18  4:44 AM  Result Value  Ref Range   WBC 11.1 (H) 4.0 - 10.5 K/uL   RBC 3.63  (L) 3.87 - 5.11 MIL/uL   Hemoglobin 10.7 (L) 12.0 - 15.0 g/dL   HCT 33.7 (L) 36.0 - 46.0 %   MCV 92.8 80.0 - 100.0 fL   MCH 29.5 26.0 - 34.0 pg   MCHC 31.8 30.0 - 36.0 g/dL   RDW 13.2 11.5 - 15.5 %   Platelets 228 150 - 400 K/uL   nRBC 0.0 0.0 - 0.2 %    Comment: Performed at [facility], [place] [address], [place], [place] [address]  Glucose, capillary     Status: Abnormal   Collection Time: 05/05/18  8:59 AM  Result Value Ref Range   Glucose-Capillary 176 (H) 70 - 99 mg/dL  Glucose, capillary     Status: Abnormal   Collection Time: 05/05/18 11:47 AM  Result Value Ref Range   Glucose-Capillary 189 (H) 70 - 99 mg/dL  Glucose, capillary     Status: Abnormal   Collection Time: 05/05/18  4:47 PM  Result Value Ref Range   Glucose-Capillary 300 (H) 70 - 99 mg/dL    Ct Lumbar Spine Wo Contrast  Result Date: 05/04/2018 CLINICAL DATA:  85 y/o F; sudden onset of lower back pain for 2 days. Pain radiates to the left lower extremity. EXAM: CT LUMBAR SPINE WITHOUT CONTRAST TECHNIQUE: Multidetector CT imaging of the lumbar spine was performed without intravenous contrast administration. Multiplanar CT image reconstructions were also generated. COMPARISON:  06/19/2016 lumbar spine radiographs. Concurrent CT pelvis. 09/21/2014 lumbar spine myelogram. FINDINGS: Segmentation: 5 lumbar type vertebrae. Alignment: Mild lumbar levocurvature with apex at L4. L4-5 grade 1 anterolisthesis without pars defect. Vertebrae: No acute fracture or focal pathologic process. Chronic postsurgical changes related to L4-5 laminectomy. Hemilaminectomy. Paraspinal and other soft tissues: Negative. Disc levels: Moderate spondylosis of the lumbar spine with loss of intervertebral disc space height greatest at the L2-3 and L5-S1 levels. Prominent lower lumbar facet arthrosis. At the right L4-5 level there is a foraminal disc protrusion impinging the exiting L4 nerve root and resulting in severe foraminal stenosis.  Mild-to-moderate foraminal stenosis at additional levels. Additionally, at L4-5 there is multifactorial mild-to-moderate spinal canal stenosis. No high-grade spinal canal stenosis identified. IMPRESSION: 1. No acute osseous abnormality identified. 2. Moderate lumbar spondylosis greatest the L4-5 level, stable from prior lumbar spine myelogram given differences in technique. 3. Right L4-5 foraminal disc protrusion impinging the exiting L4 nerve root and resulting in severe foraminal stenosis. 4. Mild-to-moderate spinal canal stenosis at L4-5. Electronically Signed   By: Kristine Garbe M.D.   On: 05/04/2018 22:31   Ct Pelvis Wo Contrast  Result Date: 05/04/2018 CLINICAL DATA:  85 y/o F; new onset lower back pain for 2 days. Lower back and pelvic pain radiates to the left. EXAM: CT PELVIS WITHOUT CONTRAST TECHNIQUE: Multidetector CT imaging of the pelvis was performed following the standard protocol without intravenous contrast. COMPARISON:  05/04/2018 lumbar spine CT. FINDINGS: Urinary Tract:  No abnormality visualized. Bowel: Appendectomy. Sigmoid diverticulosis without findings of diverticulitis. Vascular/Lymphatic: No pathologically enlarged lymph nodes. Aortic and iliofemoral calcific atherosclerosis. Reproductive:  Hysterectomy. Other:  None. Musculoskeletal: Hip joints and sacroiliac joints are well maintained. Degenerative changes of the symphysis pubis with osteophytosis and sclerosis of the articular surfaces. No acute fracture identified. IMPRESSION: 1. No acute fracture or dislocation identified. 2. Sigmoid diverticulosis without findings of diverticulitis. 3. Aortic and iliofemoral calcific atherosclerosis. Electronically Signed   By: Edgardo Roys.D.  On: 05/04/2018 22:41   Mr Lumbar Spine Wo Contrast  Result Date: 05/05/2018 CLINICAL DATA:  Sudden onset of severe pain for 3 days, present in the lower back and radiating to the left lower extremity EXAM: MRI LUMBAR SPINE  WITHOUT CONTRAST TECHNIQUE: Multiplanar, multisequence MR imaging of the lumbar spine was performed. No intravenous contrast was administered. COMPARISON:  Lumbar spine CT 05/04/2018 FINDINGS: Segmentation:  5 lumbar type vertebral bodies Alignment: Grade 1 anterolisthesis at L4-5. Slight retrolisthesis at L2-3. Mild levoscoliosis. Vertebrae: No fracture, evidence of discitis, or aggressive bone lesion. Conus medullaris and cauda equina: Conus extends to the L1-2 disc level. Conus and cauda equina appear normal. Paraspinal and other soft tissues: Negative Disc levels: T12- L1: Unremarkable. L1-L2: Disc narrowing and bulging.  No impingement L2-L3: Disc narrowing and left eccentric bulging. There is left subarticular recess narrowing without static L3 compression. Mild left foraminal stenosis. Mild facet spurring. L3-L4: Disc narrowing and bulging. Mild facet spurring. No neural compression L4-L5: Facet degeneration with advanced joint distortion and spurring. There is anterolisthesis and right preferential disc bulging. Severe right foraminal stenosis with L4 compression. Mild left foraminal narrowing. Mild to moderate spinal stenosis. The subarticular recesses are effaced but the descending L5 nerve roots are medial and non-compressed. Scarring related to prior surgery at this level. L5-S1:Advanced disc narrowing with bulging and endplate ridging. There is facet spurring. Moderate bilateral foraminal narrowing. Patent spinal canal. IMPRESSION: 1. No acute finding. 2. Multilevel disc and facet degeneration with L4-5 anterolisthesis. 3. L4-5 severe right foraminal impingement. Mild to moderate spinal stenosis at this level. 4. L5-S1 moderate bilateral foraminal narrowing. Electronically Signed   By: Monte Fantasia M.D.   On: 05/05/2018 08:05    Review of Systems  Constitutional: Negative.   HENT: Negative.   Eyes: Negative.   Respiratory: Negative.   Cardiovascular: Negative.   Gastrointestinal: Negative.    Genitourinary: Negative.   Musculoskeletal: Positive for back pain.  Skin: Negative.   Neurological: Negative.   Endo/Heme/Allergies: Negative.   Psychiatric/Behavioral: Negative.    Blood pressure 128/69, pulse 81, temperature 97.7 F (36.5 C), temperature source Oral, resp. rate 20, height 5\' 8"  (1.727 m), weight 64.9 kg, SpO2 94 %. Physical Exam  Constitutional: She is oriented to person, place, and time. She appears well-developed and well-nourished.  HENT:  Head: Normocephalic and atraumatic.  Eyes: Pupils are equal, round, and reactive to light. Conjunctivae and EOM are normal.  Neck: Normal range of motion. Neck supple.  GI: Soft. Bowel sounds are normal.  Musculoskeletal:     Comments: Tender to palpation and percussion across the lumbar spine straight leg raising reproduces back pain at 30 degrees in either lower extremity Patrick's maneuver is negative no radicular pain is reproduced.  Neurological: She is alert and oriented to person, place, and time.  Motor function reveals 4 out of 5 strength in the iliopsoas the quadriceps tibialis anterior and gastroc to confrontational testing.  Tone and bulk appear intact patellar reflexes are 1+ in both patellae absent both Achilles.  Babinski is downgoing sensation appears intact in the lower extremities.  She has severe pain on mobility of the left shoulder and cannot abduct the left shoulder however strength in the biceps triceps grips and intrinsics is intact and symmetric bilaterally.  All nerve examination is normal.    Assessment/Plan: Spondylolisthesis L4-L5 with moderate central and lateral recess stenosis.  Plan I would advise a translaminar epidural steroid injection be performed.  Based on the degree of relief we see  physical therapy can be added to help mobilize the patient.  Blanchie Dessert Dannon Perlow 05/06/2018, 9:11 AM

## 2018-05-06 NOTE — Evaluation (Signed)
Physical Therapy Evaluation Patient Details Name: Katie Alvarado MRN: [id number] DOB: March 19, 1926 Today's Date: 05/06/2018   History of Present Illness   Katie Alvarado is a 85 year old individual who has developed severe and unremitting back pain that is become intractable.  PMH: lumbar decompression, DM2, HTN, Bronchiectasis , anxiety  Clinical Impression  Pt admitted with above diagnosis. Pt currently with functional limitations due to the deficits listed below (see PT Problem List). Pt will benefit from skilled PT to increase their independence and safety with mobility to allow discharge to the venue listed below.  Pt able to transfer to EOB and then to recliner with increase in back pain to 8/10.  She also reports increase in L shoulder pain and decreased mobility since admission.  Pt lives alone and daughter, who was in room, lives in [place].  At this time, recommend SNF unless her mobility improves during her hospital stay.    Follow Up Recommendations SNF    Equipment Recommendations  None recommended by PT    Recommendations for Other Services OT consult     Precautions / Restrictions Precautions Precautions: Fall Precaution Comments: pt reports no falls in last 6 months Restrictions Weight Bearing Restrictions: No      Mobility  Bed Mobility Overal bed mobility: Needs Assistance Bed Mobility: Rolling;Sidelying to Sit Rolling: Min assist Sidelying to sit: Mod assist       General bed mobility comments: cues for proper body mechanics  Transfers Overall transfer level: Needs assistance Equipment used: 1 person hand held assist Transfers: Sit to/from Stand;Stand Pivot Transfers Sit to Stand: Min assist Stand pivot transfers: Mod assist       General transfer comment: Pt stood with MIN A, but reliant on PT for SPT with some fear noted.  Her pain increased with transfer. Unable to attempt use of RW due to L shoulder.  Ambulation/Gait                Stairs             Wheelchair Mobility    Modified Rankin (Stroke Patients Only)       Balance Overall balance assessment: Needs assistance   Sitting balance-Leahy Scale: Fair       Standing balance-Leahy Scale: Poor                               Pertinent Vitals/Pain Pain Assessment: Faces Pain Score: 8 (after transfer) Faces Pain Scale: Hurts little more(prior to transfer) Pain Location: low back and R hip and L arm Pain Intervention(s): Premedicated before session;Limited activity within patient's tolerance;Monitored during session    [place] expects to be discharged to:: Private residence Living Arrangements: Alone Available Help at Discharge: Neighbor;Available PRN/intermittently Type of Home: House Home Access: Ramped entrance     Home Layout: One level Home Equipment: [place] - 4 wheels;Bedside commode;Shower seat - built in;Cane - single point Additional Comments: friend drives her to store and MD    Prior Function Level of Independence: Independent with assistive device(s)         Comments: uses rollator, but at home will sometimes furniture cruise     Hand Dominance   Dominant Hand: Right    Extremity/Trunk Assessment   Upper Extremity Assessment Upper Extremity Assessment: LUE deficits/detail LUE Deficits / Details: Pt with no L shoulder movement due to pain. Able to flex/extend elbow in limited ROM and pronate/supinate LUE: Unable to fully assess  due to pain    Lower Extremity Assessment Lower Extremity Assessment: Generalized weakness       Communication   Communication: No difficulties  Cognition Arousal/Alertness: Awake/alert Behavior During Therapy: WFL for tasks assessed/performed Overall Cognitive Status: Within Functional Limits for tasks assessed                                        General Comments General comments (skin integrity, edema, etc.): decreased vision    Exercises      Assessment/Plan    PT Assessment Patient needs continued PT services  PT Problem List Decreased strength;Decreased balance;Decreased activity tolerance;Decreased cognition;Pain       PT Treatment Interventions DME instruction;Gait training;Therapeutic exercise;Therapeutic activities;Functional mobility training;Balance training;Patient/family education    PT Goals (Current goals can be found in the Care Plan section)  Acute Rehab PT Goals Patient Stated Goal: decrease pain in back and L arm PT Goal Formulation: With patient/family Time For Goal Achievement: 05/20/18 Potential to Achieve Goals: Good    Frequency Min 2X/week   Barriers to discharge Decreased caregiver support      Co-evaluation               AM-PAC PT "6 Clicks" Mobility  Outcome Measure Help needed turning from your back to your side while in a flat bed without using bedrails?: A Little Help needed moving from lying on your back to sitting on the side of a flat bed without using bedrails?: A Lot Help needed moving to and from a bed to a chair (including a wheelchair)?: A Lot Help needed standing up from a chair using your arms (e.g., wheelchair or bedside chair)?: A Little Help needed to walk in hospital room?: A Lot Help needed climbing 3-5 steps with a railing? : Total 6 Click Score: 13    End of Session Equipment Utilized During Treatment: Gait belt Activity Tolerance: Patient limited by pain Patient left: in chair;with family/visitor present;with call bell/phone within reach Nurse Communication: Mobility status PT Visit Diagnosis: Unsteadiness on feet (R26.81);Muscle weakness (generalized) (M62.81);Difficulty in walking, not elsewhere classified (R26.2);Pain Pain - Right/Left: Left Pain - part of body: Shoulder    Time: 6384-6659 PT Time Calculation (min) (ACUTE ONLY): 31 min   Charges:   PT Evaluation $PT Eval Moderate Complexity: 1 Mod PT Treatments $Gait Training: 8-22 mins         Kinsie Belford L. Tamala Julian, [place] Pager [phone] 05/06/2018   Galen Manila 05/06/2018, 1:27 PM

## 2018-05-06 NOTE — Progress Notes (Signed)
PROGRESS NOTE    Katie Alvarado  HCW:[id number] DOB: Aug 25, 1925 DOA: 05/04/2018 PCP: Hulan Fess, MD Brief Narrative:85 y.o. female with history of bronchiectasis, hypertension, diabetes mellitus, GERD, anxiety who was recently treated for pneumonia last dose of antibiotic was yesterday has been experiencing sudden onset of severe pain over the last 3 days.  Pain is mostly in the low back radiating to her left lower extremity.  Unable to ambulate because of the pain.  Denies any incontinence of urine or bowel.  Denies any fall.  Denies any fever chills.  ED Course: In the ER patient had CT lumbar spine done which shows lumbar spondylosis with disc protrusion impinging on the exiting L4 on the right side causing severe foraminal stenosis.  Dr. Ellene Route on-call neurosurgeon was consulted.  Dr. Meda Coffee advised to give Decadron and get MRI in the morning.  And to reconsult after MRI results Assessment & Plan:   Principal Problem:   Low back pain radiating down leg Active Problems:   DM type 2 (diabetes mellitus, type 2) (HCC)   Bronchiectasis without complication (HCC)   Essential hypertension   Malignant neoplasm of lower lip   Back pain   Low back pain    1. Low back pain secondary to severe foraminal stenosis at the L4 -5 Dr. Ellene Route neurosurgery advised trans laminar epidural steroid injection.  This has been scheduled with interventional radiology.  Continue Decadron.  Daughter by the bedside very concerned that patient lives alone and is requesting home health.  Daughter lives in [place]. 2. Normocytic normochromic anemia appears to be new.  Follow CBC. 3. Diabetes mellitus type 2 we will keep patient on sliding scale coverage. 4. History of bronchiectasis with recent treatment for pneumonia.  Continue inhalers. 5. Hypertension on Cozaar and hydrochlorothiazide. 6. History of gout on allopurinol. 7. Anxiety on Xanax   Estimated body mass index is 21.76 kg/m as calculated from the  following:   Height as of this encounter: 5\' 8"  (1.727 m).   Weight as of this encounter: 64.9 kg.  DVT prophylaxis: SCD Code Status: Full code Family Communication: Discussed with daughter who is in the room Disposition Plan: Pending clinical improvement patient still is in severe pain has not gotten out of bed PT yet to see her. Consultants: Neurosurgery  Procedures none Antimicrobials: None  Subjective: Resting in bed waiting for the procedure complains of 9 out of 10 back pain left shoulder pain   Objective: Vitals:   05/05/18 2205 05/06/18 0430 05/06/18 0439 05/06/18 0902  BP: 119/67 128/69    Pulse: 87 81    Resp: 20 20    Temp: 97.8 F (36.6 C) 97.7 F (36.5 C)    TempSrc: Oral Oral    SpO2: 95% 94%  95%  Weight:   64.9 kg   Height:        Intake/Output Summary (Last 24 hours) at 05/06/2018 1054 Last data filed at 05/06/2018 0600 Gross per 24 hour  Intake 300 ml  Output 800 ml  Net -500 ml   Filed Weights   05/05/18 0904 05/06/18 0439  Weight: 66.2 kg 64.9 kg    Examination: Moving all all the extremities though limited due to pain  General exam: Appears calm and comfortable  Respiratory system: Clear to auscultation. Respiratory effort normal. Cardiovascular system: S1 & S2 heard, RRR. No JVD, murmurs, rubs, gallops or clicks. No pedal edema. Gastrointestinal system: Abdomen is nondistended, soft and nontender. No organomegaly or masses felt. Normal bowel sounds heard.  Central nervous system: Alert and oriented. No focal neurological deficits. Extremities: Creased range of motion to the left shoulder Skin: No rashes, lesions or ulcers Psychiatry: Judgement and insight appear normal. Mood & affect appropriate.     Data Reviewed: I have personally reviewed following labs and imaging studies  CBC: Recent Labs  Lab 05/04/18 2033 05/05/18 0444  WBC 10.3 11.1*  NEUTROABS 7.4  --   HGB 10.8* 10.7*  HCT 33.7* 33.7*  MCV 91.1 92.8  PLT 236 993    Basic Metabolic Panel: Recent Labs  Lab 05/04/18 2033 05/05/18 0444  NA 131* 131*  K 3.9 3.9  CL 96* 96*  CO2 23 22  GLUCOSE 167* 140*  BUN 22 18  CREATININE 0.94 0.91  CALCIUM 8.7* 8.6*   GFR: Estimated Creatinine Clearance: 41.4 mL/min (by C-G formula based on SCr of 0.91 mg/dL). Liver Function Tests: No results for input(s): AST, ALT, ALKPHOS, BILITOT, PROT, ALBUMIN in the last 168 hours. No results for input(s): LIPASE, AMYLASE in the last 168 hours. No results for input(s): AMMONIA in the last 168 hours. Coagulation Profile: No results for input(s): INR, PROTIME in the last 168 hours. Cardiac Enzymes: No results for input(s): CKTOTAL, CKMB, CKMBINDEX, TROPONINI in the last 168 hours. BNP (last 3 results) No results for input(s): PROBNP in the last 8760 hours. HbA1C: No results for input(s): HGBA1C in the last 72 hours. CBG: Recent Labs  Lab 05/05/18 0859 05/05/18 1147 05/05/18 1647  GLUCAP 176* 189* 300*   Lipid Profile: No results for input(s): CHOL, HDL, LDLCALC, TRIG, CHOLHDL, LDLDIRECT in the last 72 hours. Thyroid Function Tests: No results for input(s): TSH, T4TOTAL, FREET4, T3FREE, THYROIDAB in the last 72 hours. Anemia Panel: No results for input(s): VITAMINB12, FOLATE, FERRITIN, TIBC, IRON, RETICCTPCT in the last 72 hours. Sepsis Labs: No results for input(s): PROCALCITON, LATICACIDVEN in the last 168 hours.  No results found for this or any previous visit (from the past 240 hour(s)).       Radiology Studies: Ct Lumbar Spine Wo Contrast  Result Date: 05/04/2018 CLINICAL DATA:  85 y/o F; sudden onset of lower back pain for 2 days. Pain radiates to the left lower extremity. EXAM: CT LUMBAR SPINE WITHOUT CONTRAST TECHNIQUE: Multidetector CT imaging of the lumbar spine was performed without intravenous contrast administration. Multiplanar CT image reconstructions were also generated. COMPARISON:  06/19/2016 lumbar spine radiographs. Concurrent CT  pelvis. 09/21/2014 lumbar spine myelogram. FINDINGS: Segmentation: 5 lumbar type vertebrae. Alignment: Mild lumbar levocurvature with apex at L4. L4-5 grade 1 anterolisthesis without pars defect. Vertebrae: No acute fracture or focal pathologic process. Chronic postsurgical changes related to L4-5 laminectomy. Hemilaminectomy. Paraspinal and other soft tissues: Negative. Disc levels: Moderate spondylosis of the lumbar spine with loss of intervertebral disc space height greatest at the L2-3 and L5-S1 levels. Prominent lower lumbar facet arthrosis. At the right L4-5 level there is a foraminal disc protrusion impinging the exiting L4 nerve root and resulting in severe foraminal stenosis. Mild-to-moderate foraminal stenosis at additional levels. Additionally, at L4-5 there is multifactorial mild-to-moderate spinal canal stenosis. No high-grade spinal canal stenosis identified. IMPRESSION: 1. No acute osseous abnormality identified. 2. Moderate lumbar spondylosis greatest the L4-5 level, stable from prior lumbar spine myelogram given differences in technique. 3. Right L4-5 foraminal disc protrusion impinging the exiting L4 nerve root and resulting in severe foraminal stenosis. 4. Mild-to-moderate spinal canal stenosis at L4-5. Electronically Signed   By: Kristine Garbe M.D.   On: 05/04/2018 22:31   Ct  Pelvis Wo Contrast  Result Date: 05/04/2018 CLINICAL DATA:  85 y/o F; new onset lower back pain for 2 days. Lower back and pelvic pain radiates to the left. EXAM: CT PELVIS WITHOUT CONTRAST TECHNIQUE: Multidetector CT imaging of the pelvis was performed following the standard protocol without intravenous contrast. COMPARISON:  05/04/2018 lumbar spine CT. FINDINGS: Urinary Tract:  No abnormality visualized. Bowel: Appendectomy. Sigmoid diverticulosis without findings of diverticulitis. Vascular/Lymphatic: No pathologically enlarged lymph nodes. Aortic and iliofemoral calcific atherosclerosis. Reproductive:   Hysterectomy. Other:  None. Musculoskeletal: Hip joints and sacroiliac joints are well maintained. Degenerative changes of the symphysis pubis with osteophytosis and sclerosis of the articular surfaces. No acute fracture identified. IMPRESSION: 1. No acute fracture or dislocation identified. 2. Sigmoid diverticulosis without findings of diverticulitis. 3. Aortic and iliofemoral calcific atherosclerosis. Electronically Signed   By: Kristine Garbe M.D.   On: 05/04/2018 22:41   Mr Lumbar Spine Wo Contrast  Result Date: 05/05/2018 CLINICAL DATA:  Sudden onset of severe pain for 3 days, present in the lower back and radiating to the left lower extremity EXAM: MRI LUMBAR SPINE WITHOUT CONTRAST TECHNIQUE: Multiplanar, multisequence MR imaging of the lumbar spine was performed. No intravenous contrast was administered. COMPARISON:  Lumbar spine CT 05/04/2018 FINDINGS: Segmentation:  5 lumbar type vertebral bodies Alignment: Grade 1 anterolisthesis at L4-5. Slight retrolisthesis at L2-3. Mild levoscoliosis. Vertebrae: No fracture, evidence of discitis, or aggressive bone lesion. Conus medullaris and cauda equina: Conus extends to the L1-2 disc level. Conus and cauda equina appear normal. Paraspinal and other soft tissues: Negative Disc levels: T12- L1: Unremarkable. L1-L2: Disc narrowing and bulging.  No impingement L2-L3: Disc narrowing and left eccentric bulging. There is left subarticular recess narrowing without static L3 compression. Mild left foraminal stenosis. Mild facet spurring. L3-L4: Disc narrowing and bulging. Mild facet spurring. No neural compression L4-L5: Facet degeneration with advanced joint distortion and spurring. There is anterolisthesis and right preferential disc bulging. Severe right foraminal stenosis with L4 compression. Mild left foraminal narrowing. Mild to moderate spinal stenosis. The subarticular recesses are effaced but the descending L5 nerve roots are medial and  non-compressed. Scarring related to prior surgery at this level. L5-S1:Advanced disc narrowing with bulging and endplate ridging. There is facet spurring. Moderate bilateral foraminal narrowing. Patent spinal canal. IMPRESSION: 1. No acute finding. 2. Multilevel disc and facet degeneration with L4-5 anterolisthesis. 3. L4-5 severe right foraminal impingement. Mild to moderate spinal stenosis at this level. 4. L5-S1 moderate bilateral foraminal narrowing. Electronically Signed   By: Monte Fantasia M.D.   On: 05/05/2018 08:05        Scheduled Meds: . allopurinol  100 mg Oral Daily  . amLODipine  2.5 mg Oral Daily  . budesonide (PULMICORT) nebulizer solution  0.25 mg Nebulization BID  . dexamethasone  10 mg Intravenous Once  . dexamethasone  4 mg Intravenous Q24H  . insulin aspart  0-9 Units Subcutaneous TID WC  . insulin aspart  5 Units Subcutaneous Once  . lidocaine  1 patch Transdermal Daily  . losartan  100 mg Oral Daily  . omega-3 acid ethyl esters  1 g Oral Daily  . pantoprazole  40 mg Oral Daily  . umeclidinium-vilanterol  1 puff Inhalation Daily   Continuous Infusions:   LOS: 1 day     Georgette Shell, MD [facility]  If 7PM-7AM, please contact night-coverage [email or web address] Password [email or web address] 05/06/2018, 10:54 AM

## 2018-05-07 ENCOUNTER — Encounter (HOSPITAL_COMMUNITY): Payer: Self-pay | Admitting: Interventional Radiology

## 2018-05-07 ENCOUNTER — Inpatient Hospital Stay (HOSPITAL_COMMUNITY): Payer: Medicare Other

## 2018-05-07 ENCOUNTER — Ambulatory Visit: Payer: Medicare Other | Admitting: Internal Medicine

## 2018-05-07 HISTORY — PX: IR EPIDUROGRAPHY: IMG2365

## 2018-05-07 LAB — GLUCOSE, CAPILLARY
Glucose-Capillary: 133 mg/dL — ABNORMAL HIGH (ref 70–99)
Glucose-Capillary: 226 mg/dL — ABNORMAL HIGH (ref 70–99)
Glucose-Capillary: 237 mg/dL — ABNORMAL HIGH (ref 70–99)
Glucose-Capillary: 183 mg/dL — ABNORMAL HIGH (ref 70–99)

## 2018-05-07 MED ORDER — IOPAMIDOL (ISOVUE-M 200) INJECTION 41%
INTRAMUSCULAR | Status: AC
  Administered 2018-05-07: 2 mL via EPIDURAL
  Filled 2018-05-07: qty 10

## 2018-05-07 MED ORDER — IOPAMIDOL (ISOVUE-M 200) INJECTION 41%
2.0000 mL | Freq: Once | INTRAMUSCULAR | Status: AC
  Administered 2018-05-07: 2 mL via EPIDURAL

## 2018-05-07 MED ORDER — METHYLPREDNISOLONE ACETATE 80 MG/ML IJ SUSP
INTRAMUSCULAR | Status: AC
  Administered 2018-05-07: 80 mg
  Filled 2018-05-07: qty 1

## 2018-05-07 MED ORDER — LIDOCAINE HCL (PF) 1 % IJ SOLN
INTRAMUSCULAR | Status: AC
  Filled 2018-05-07: qty 30

## 2018-05-07 MED ORDER — METHYLPREDNISOLONE ACETATE 40 MG/ML IJ SUSP
INTRAMUSCULAR | Status: AC
  Administered 2018-05-07: 40 mg
  Filled 2018-05-07: qty 1

## 2018-05-07 MED ORDER — METFORMIN HCL 500 MG PO TABS
500.0000 mg | ORAL_TABLET | Freq: Two times a day (BID) | ORAL | Status: DC
  Administered 2018-05-07 – 2018-05-08 (×2): 500 mg via ORAL
  Filled 2018-05-07 (×2): qty 1

## 2018-05-07 NOTE — Evaluation (Signed)
Occupational Therapy Evaluation Patient Details Name: Katie Alvarado MRN: [id number] DOB: 29-Jan-1926 Today's Date: 05/07/2018    History of Present Illness  Katie Alvarado is a 85 year old individual who has developed severe and unremitting back pain that is become intractable. 1/16:  S/P steroid injection in interventional radiology.   PMH: lumbar decompression, DM2, HTN, Bronchiectasis , anxiety   Clinical Impression   This 85 year old female was admitted for the above. She reports improved pain and function since injection this am.  Pt needs mostly supervision at this time. She lives alone and daughter will be with her until Sunday.  Daughter lives in [place].  Will follow pt with mod I level goals. Educated on back precautions.    Follow Up Recommendations  Home health OT(if there is another service, and if pt is agreeable)    Equipment Recommendations  None recommended by OT    Recommendations for Other Services       Precautions / Restrictions Precautions Precautions: Fall Restrictions Weight Bearing Restrictions: No      Mobility Bed Mobility     Rolling: Supervision Sidelying to sit: Min guard     Sit to sidelying: Min guard General bed mobility comments: used bedrail for back to bed; cues for keeping back straight  Transfers   Equipment used: Standard walker   Sit to Stand: Supervision;From elevated surface         General transfer comment: cues for UE placement    Balance                                           ADL either performed or assessed with clinical judgement   ADL Overall ADL's : Needs assistance/impaired                                       General ADL Comments: set up/supervision for adls. Pt can cross legs comfortably for adls.   Pt ambulated in room. Her walker is broken; recommended she gets this to decrease amount of bending.       Vision         Perception     Praxis      Pertinent  Vitals/Pain Pain Assessment: Faces Faces Pain Scale: Hurts a little bit Pain Location: back Pain Descriptors / Indicators: Sore Pain Intervention(s): Limited activity within patient's tolerance;Monitored during session;Repositioned     Hand Dominance     Extremity/Trunk Assessment Upper Extremity Assessment LUE Deficits / Details: LUE longstanding issue, but it has worsened. Today, pt was able to perform 20 degrees FF, do lap slide and cradle arm for AAROM.           Communication Communication Communication: No difficulties   Cognition Arousal/Alertness: Awake/alert Behavior During Therapy: WFL for tasks assessed/performed Overall Cognitive Status: Within Functional Limits for tasks assessed                                     General Comments       Exercises     Shoulder Instructions      Home Living Family/patient expects to be discharged to:: Private residence Living Arrangements: Alone Available Help at Discharge: Neighbor;Available PRN/intermittently  Bathroom Shower/Tub: Occupational psychologist: Handicapped height     Home Equipment: Environmental consultant - 4 wheels;Bedside commode;Shower seat - built in;Cane - single point   Additional Comments: washes her feet seated in front of sink. Stands in shower; there is a bench in there      Prior Functioning/Environment Level of Independence: Independent with assistive device(s)        Comments: has cane and rollator.  Has cleaning woman, but pt does own cooking and laundry        OT Problem List: Decreased strength;Decreased activity tolerance;Pain;Decreased knowledge of use of DME or AE;Impaired balance (sitting and/or standing)      OT Treatment/Interventions: Self-care/ADL training;DME and/or AE instruction;Therapeutic activities;Patient/family education;Balance training    OT Goals(Current goals can be found in the care plan section) Acute Rehab OT Goals Patient Stated  Goal: decrease pain in back and L arm OT Goal Formulation: With patient Time For Goal Achievement: 05/21/18 Potential to Achieve Goals: Good ADL Goals Pt Will Transfer to Toilet: with modified independence;bedside commode;ambulating Pt Will Perform Tub/Shower Transfer: Shower transfer;with modified independence;tub bench Additional ADL Goal #1: pt will gather clothes at mod I level  OT Frequency: Min 2X/week   Barriers to D/C:            Co-evaluation              AM-PAC OT "6 Clicks" Daily Activity     Outcome Measure Help from another person eating meals?: None Help from another person taking care of personal grooming?: A Little Help from another person toileting, which includes using toliet, bedpan, or urinal?: A Little Help from another person bathing (including washing, rinsing, drying)?: A Little Help from another person to put on and taking off regular upper body clothing?: A Little Help from another person to put on and taking off regular lower body clothing?: A Little 6 Click Score: 19   End of Session    Activity Tolerance: Patient tolerated treatment well Patient left: in bed;with call bell/phone within reach;with family/visitor present  OT Visit Diagnosis: Muscle weakness (generalized) (M62.81)                Time: 3817-7116 OT Time Calculation (min): 20 min Charges:  OT General Charges $OT Visit: 1 Visit OT Evaluation $OT Eval Low Complexity: 1 Low  Lesle Chris, OTR/L Acute Rehabilitation Services [phone] [facility] pager [phone] office 05/07/2018  [place] 05/07/2018, 2:48 PM

## 2018-05-07 NOTE — Progress Notes (Signed)
Physical Therapy Treatment Patient Details Name: Katie Alvarado MRN: [id number] DOB: October 23, 1925 Today's Date: 05/07/2018    History of Present Illness  Katie Alvarado is a 85 year old individual who has developed severe and unremitting back pain that is become intractable. S/P steroid injection in interventional radiology.   PMH: lumbar decompression, DM2, HTN, Bronchiectasis , anxiety    PT Comments    Pt progressing quite well; pain improved since ESI. Will follow in acute setting, do not feel pt needs HH f/u and pt does not really want it. dtr present and in agreement  Follow Up Recommendations  No PT follow up     Equipment Recommendations  None recommended by PT    Recommendations for Other Services       Precautions / Restrictions Precautions Precautions: Fall Restrictions Weight Bearing Restrictions: No    Mobility  Bed Mobility     Rolling: Supervision Sidelying to sit: Supervision     Sit to sidelying: Min guard General bed mobility comments: cues for back precautions  Transfers Overall transfer level: Needs assistance Equipment used: Rolling walker (2 wheeled) Transfers: Sit to/from Stand Sit to Stand: Supervision         General transfer comment: cues for UE placement, posture  Ambulation/Gait Ambulation/Gait assistance: Supervision;Min guard Gait Distance (Feet): 280 Feet Assistive device: Rolling walker (2 wheeled) Gait Pattern/deviations: Step-through pattern     General Gait Details: cues for posture and RW safety   Stairs             Wheelchair Mobility    Modified Rankin (Stroke Patients Only)       Balance             Standing balance-Leahy Scale: Fair                              Cognition Arousal/Alertness: Awake/alert Behavior During Therapy: WFL for tasks assessed/performed Overall Cognitive Status: Within Functional Limits for tasks assessed                                        Exercises      General Comments        Pertinent Vitals/Pain Pain Assessment: Faces Faces Pain Scale: Hurts a little bit Pain Location: back Pain Descriptors / Indicators: Sore Pain Intervention(s): Limited activity within patient's tolerance;Monitored during session    Home Living Family/patient expects to be discharged to:: Private residence Living Arrangements: Alone Available Help at Discharge: Neighbor;Available PRN/intermittently         Home Equipment: Walker - 4 wheels;Bedside commode;Shower seat - built in;Cane - single point Additional Comments: washes her feet seated in front of sink. Stands in shower; there is a bench in there    Prior Function Level of Independence: Independent with assistive device(s)      Comments: has cane and rollator.  Has cleaning woman, but pt does own cooking and laundry   PT Goals (current goals can now be found in the care plan section) Acute Rehab PT Goals Patient Stated Goal: decrease pain in back and L arm PT Goal Formulation: With patient/family Time For Goal Achievement: 05/20/18 Potential to Achieve Goals: Good Progress towards PT goals: Progressing toward goals    Frequency    Min 3X/week      PT Plan Discharge plan needs to be updated;Frequency needs to be updated  Co-evaluation              AM-PAC PT "6 Clicks" Mobility   Outcome Measure  Help needed turning from your back to your side while in a flat bed without using bedrails?: None Help needed moving from lying on your back to sitting on the side of a flat bed without using bedrails?: None Help needed moving to and from a bed to a chair (including a wheelchair)?: A Little Help needed standing up from a chair using your arms (e.g., wheelchair or bedside chair)?: A Little Help needed to walk in hospital room?: A Little Help needed climbing 3-5 steps with a railing? : A Little 6 Click Score: 20    End of Session Equipment Utilized During Treatment:  Gait belt Activity Tolerance: Patient tolerated treatment well Patient left: in bed;with call bell/phone within reach;with family/visitor present   PT Visit Diagnosis: Unsteadiness on feet (R26.81);Muscle weakness (generalized) (M62.81);Difficulty in walking, not elsewhere classified (R26.2);Pain Pain - Right/Left: Left Pain - part of body: Shoulder     Time: 6606-0045 PT Time Calculation (min) (ACUTE ONLY): 18 min  Charges:  $Gait Training: 8-22 mins                     Kenyon Ana, PT  Pager: [phone] Acute Rehab Dept [facility]): [phone]   05/07/2018    [facility] 05/07/2018, 5:04 PM

## 2018-05-07 NOTE — Progress Notes (Signed)
PROGRESS NOTE    Katie Alvarado  DTO:[id number] DOB: 12-25-1925 DOA: 05/04/2018 PCP: Hulan Fess, MD    Brief Narrative:85 y.o.femalewithhistory of bronchiectasis, hypertension, diabetes mellitus, GERD, anxiety who was recently treated for pneumonia last dose of antibiotic was yesterday has been experiencing sudden onset of severe pain over the last 3 days. Pain is mostly in the low back radiating to her left lower extremity. Unable to ambulate because of the pain. Denies any incontinence of urine or bowel. Denies any fall. Denies any fever chills.  ED Course:In the ER patient had CT lumbar spine done which shows lumbar spondylosis with disc protrusion impinging on the exiting L4 on the right side causing severe foraminal stenosis. Dr. Ellene Route on-call neurosurgeon was consulted. Dr. Meda Coffee advised to give Decadron and get MRI in the morning. And to reconsult after MRI results  Assessment & Plan:   Principal Problem:   Low back pain radiating down leg Active Problems:   DM type 2 (diabetes mellitus, type 2) (HCC)   Bronchiectasis without complication (HCC)   Essential hypertension   Malignant neoplasm of lower lip   Back pain   Low back pain  1. Low back pain secondary to severe foraminal stenosis at the L4-5.  Status post epidural steroid injection 05/06/2018.  Patient reports that she is able to move better.  Physical therapy assessed the patient and recommended SNF on discharge. Continue Decadron. 2. Normocytic normochromic anemia stable. 3. Diabetes mellitus type 2 we will keep patient on sliding scale coverage.  Restart metformin. 4. History of bronchiectasis with recent treatment for pneumonia. Continue inhalers. 5. Hypertension on Cozaar and hydrochlorothiazide. 6. History of gout on allopurinol. 7. Anxiety on Xanax    Estimated body mass index is 22.02 kg/m as calculated from the following:   Height as of this encounter: 5\' 8"  (1.727 m).   Weight as of this  encounter: 65.7 kg.  DVT prophylaxis: SCD Code Status: Full code Family Communication: Daughter not in the room at this time Disposition Plan: Pending SNF placement   Consultants: Interventional radiology neurosurgery  Procedures: Epidural steroid injection 05/06/2018 Antimicrobials: None Subjective: Reports feeling better moving   Objective: Vitals:   05/06/18 2127 05/07/18 0500 05/07/18 0600 05/07/18 0956  BP:   126/68   Pulse:   70   Resp:   16   Temp:   98.2 F (36.8 C)   TempSrc:   Oral   SpO2: 92%  94% 95%  Weight:  65.7 kg    Height:        Intake/Output Summary (Last 24 hours) at 05/07/2018 1058 Last data filed at 05/07/2018 0815 Gross per 24 hour  Intake 600 ml  Output 1200 ml  Net -600 ml   Filed Weights   05/05/18 0904 05/06/18 0439 05/07/18 0500  Weight: 66.2 kg 64.9 kg 65.7 kg    Examination:  General exam: Appears calm and comfortable  Respiratory system: Clear to auscultation. Respiratory effort normal. Cardiovascular system: S1 & S2 heard, RRR. No JVD, murmurs, rubs, gallops or clicks. No pedal edema. Gastrointestinal system: Abdomen is nondistended, soft and nontender. No organomegaly or masses felt. Normal bowel sounds heard. Central nervous system: Alert and oriented. No focal neurological deficits. Extremities: Symmetric 5 x 5 power. Skin: No rashes, lesions or ulcers Psychiatry: Judgement and insight appear normal. Mood & affect appropriate.     Data Reviewed: I have personally reviewed following labs and imaging studies  CBC: Recent Labs  Lab 05/04/18 2033 05/05/18 0444  WBC  10.3 11.1*  NEUTROABS 7.4  --   HGB 10.8* 10.7*  HCT 33.7* 33.7*  MCV 91.1 92.8  PLT 236 161   Basic Metabolic Panel: Recent Labs  Lab 05/04/18 2033 05/05/18 0444  NA 131* 131*  K 3.9 3.9  CL 96* 96*  CO2 23 22  GLUCOSE 167* 140*  BUN 22 18  CREATININE 0.94 0.91  CALCIUM 8.7* 8.6*   GFR: Estimated Creatinine Clearance: 41.4 mL/min (by C-G  formula based on SCr of 0.91 mg/dL). Liver Function Tests: No results for input(s): AST, ALT, ALKPHOS, BILITOT, PROT, ALBUMIN in the last 168 hours. No results for input(s): LIPASE, AMYLASE in the last 168 hours. No results for input(s): AMMONIA in the last 168 hours. Coagulation Profile: No results for input(s): INR, PROTIME in the last 168 hours. Cardiac Enzymes: No results for input(s): CKTOTAL, CKMB, CKMBINDEX, TROPONINI in the last 168 hours. BNP (last 3 results) No results for input(s): PROBNP in the last 8760 hours. HbA1C: No results for input(s): HGBA1C in the last 72 hours. CBG: Recent Labs  Lab 05/05/18 1647 05/06/18 1258 05/06/18 1729 05/06/18 2043 05/07/18 0758  GLUCAP 300* 222* 316* 191* 133*   Lipid Profile: No results for input(s): CHOL, HDL, LDLCALC, TRIG, CHOLHDL, LDLDIRECT in the last 72 hours. Thyroid Function Tests: No results for input(s): TSH, T4TOTAL, FREET4, T3FREE, THYROIDAB in the last 72 hours. Anemia Panel: No results for input(s): VITAMINB12, FOLATE, FERRITIN, TIBC, IRON, RETICCTPCT in the last 72 hours. Sepsis Labs: No results for input(s): PROCALCITON, LATICACIDVEN in the last 168 hours.  No results found for this or any previous visit (from the past 240 hour(s)).       Radiology Studies: Ir Epidurography  Result Date: 05/07/2018 CLINICAL DATA:  Right low back and left lower extremity pain. Multilevel disc and facet degeneration with L4-5 anterolisthesis. L4-5 severe right foraminal impingement. Mild to moderate spinal stenosis at this level. L5-S1 moderate bilateral foraminal narrowing. EXAM: LUMBAR EPIDURAL INJECTION: DIAGNOSTIC EPIDURAL INJECTION: THERAPEUTIC EPIDURAL INJECTION: PROCEDURE: The procedure, risks, benefits, and alternatives were explained to the patient. Questions regarding the procedure were encouraged and answered. The patient understands and consents to the procedure. An interlaminar approach was performed on right at L5-S1  directed towards the midline. The overlying skin was cleansed and anesthetized. A 20 gauge epidural needle was advanced using loss-of-resistance technique. Injection of Isovue-M 200 shows a good epidural pattern with spread above and below the level of needle placement, primarily on the right and crossing midline. No vascular opacification is seen. 120mg  of Depo-Medrol mixed with 84ml lidocaine 1% were instilled. The procedure was well-tolerated, and the patient was discharged thirty minutes following the injection in good condition. FLUOROSCOPY TIME:  30 seconds; 4 mGy COMPLICATIONS: None immediate IMPRESSION: Technically successful epidural injection at L5-S1 as above. Electronically Signed   By: Lucrezia Europe M.D.   On: 05/07/2018 09:01        Scheduled Meds: . allopurinol  100 mg Oral Daily  . amLODipine  2.5 mg Oral Daily  . budesonide (PULMICORT) nebulizer solution  0.25 mg Nebulization BID  . dexamethasone  10 mg Intravenous Once  . dexamethasone  4 mg Intravenous Q24H  . insulin aspart  0-9 Units Subcutaneous TID WC  . insulin aspart  5 Units Subcutaneous Once  . lidocaine  1 patch Transdermal Daily  . lidocaine (PF)      . losartan  100 mg Oral Daily  . methylPREDNISolone acetate      . methylPREDNISolone acetate      .  omega-3 acid ethyl esters  1 g Oral Daily  . pantoprazole  40 mg Oral Daily  . umeclidinium-vilanterol  1 puff Inhalation Daily   Continuous Infusions:   LOS: 2 days      Georgette Shell, MD [facility]  If 7PM-7AM, please contact night-coverage [email or web address] Password [facility] 05/07/2018, 10:58 AM

## 2018-05-07 NOTE — Clinical Social Work Note (Signed)
Clinical Social Work Assessment  Patient Details  Name: Katie Alvarado MRN: [id number] Date of Birth: [birth year]  Date of referral:  05/07/18               Reason for consult:  Facility Placement, Discharge Planning                Permission sought to share information with:  Family Supports Permission granted to share information::  Yes, Verbal Permission Granted  Name::     Harl Favor  Agency::     Relationship::  friend  Contact Information:  [phone]  Housing/Transportation Living arrangements for the past 2 months:  [place] of Information:  Patient Patient Interpreter Needed:  None Criminal Activity/Legal Involvement Pertinent to Current Situation/Hospitalization:  No - Comment as needed Significant Relationships:  Adult Children Lives with:  Self Do you feel safe going back to the place where you live?  Yes Need for family participation in patient care:  Yes (Comment)  Care giving concerns:  No family at bedside. Patient lives at home alone but has support from friends in the area. Patient's adult daughter lives in [place] but is here until [employer] / plan:  CSW met patient at bedside to discuss discharge plans. CSW asked patient if she would like to go to SNF. Patient stated she wants to go back home. Patient refused Home Health and stated she doesn't need it. CSW will make MD and RNCM on floor aware of patient discharge plan.  Employment status:  Retired Forensic scientist:  Medicare PT Recommendations:  [place] / Referral to community resources:  [place]  Patient/Family's Response to care:  Patient extremely pleasant  Patient/Family's Understanding of and Emotional Response to Diagnosis, Current Treatment, and Prognosis:  Patients want to go home. Patient does not want to be followed by home health at home. Patient stated she has taken safety measures at home to prevent  herself from falling    Emotional Assessment Appearance:  Appears stated age Attitude/Demeanor/Rapport:  Engaged Affect (typically observed):  Accepting, Pleasant Orientation:  Oriented to Self, Oriented to Place, Oriented to  Time, Oriented to Situation Alcohol / Substance use:  Not Applicable Psych involvement (Current and /or in the community):  No (Comment)  Discharge Needs  Concerns to be addressed:  Care Coordination Readmission within the last 30 days:  No Current discharge risk:  Dependent with Mobility Barriers to Discharge:  No Barriers Identified   Wende Neighbors, LCSW 05/07/2018, 4:47 PM

## 2018-05-07 NOTE — Procedures (Signed)
  Procedure: L5-S1 ESI depomedrol 120mg  EBL:   minimal Complications:  none immediate  See full dictation in [employer].  Dillard Cannon MD Main # [phone] Pager  [phone]

## 2018-05-08 LAB — GLUCOSE, CAPILLARY
Glucose-Capillary: 186 mg/dL — ABNORMAL HIGH (ref 70–99)
Glucose-Capillary: 121 mg/dL — ABNORMAL HIGH (ref 70–99)
Glucose-Capillary: 144 mg/dL — ABNORMAL HIGH (ref 70–99)
Glucose-Capillary: 120 mg/dL — ABNORMAL HIGH (ref 70–99)

## 2018-05-08 MED ORDER — DEXAMETHASONE 0.5 MG PO TABS: ORAL_TABLET | ORAL | 0 refills | Status: DC

## 2018-05-08 MED ORDER — TRAMADOL HCL 50 MG PO TABS: 25.0000 mg | ORAL_TABLET | Freq: Four times a day (QID) | ORAL | 1 refills | Status: DC | PRN

## 2018-05-08 NOTE — Progress Notes (Signed)
Occupational Therapy Treatment Patient Details Name: ETHELYN CERNIGLIA MRN: [id number] DOB: [birth year] Today's Date: 05/08/2018    History of present illness  Breeona Waid is a 85 year old individual who has developed severe and unremitting back pain that is become intractable. S/P steroid injection in interventional radiology.   PMH: lumbar decompression, DM2, HTN, Bronchiectasis , anxiety   OT comments  Pt's LUE has gained strength. Updated HEP.  Pt at supervision level for gathering clothes and toilet transfers. She still feels dizzy since taking tramadol last night.  No LOB.  Pt does not want follow up therapy. She will have daughter with her until Sunday  Follow Up Recommendations  No OT follow up(pt declines HH services)    Equipment Recommendations  None recommended by OT    Recommendations for Other Services      Precautions / Restrictions Precautions Precautions: Fall Precaution Comments: pt reports no falls in last 6 months Restrictions Weight Bearing Restrictions: No       Mobility Bed Mobility                  Transfers   Equipment used: Rolling walker (2 wheeled)   Sit to Stand: Supervision              Balance                                           ADL either performed or assessed with clinical judgement   ADL                                         General ADL Comments: ambulated in room simulating gathering clothes at supervision level. Also practiced toilet transfer at supervision level.  Pt still feels dizzy from tramadol she took last night. Steady using RW. Daughter will be with her until sunday     Vision       Perception     Praxis      Cognition Arousal/Alertness: Awake/alert Behavior During Therapy: WFL for tasks assessed/performed Overall Cognitive Status: Within Functional Limits for tasks assessed                                          Exercises     Shoulder  Instructions       General Comments      Pertinent Vitals/ Pain       Pain Assessment: No/denies pain  Home Living                                          Prior Functioning/Environment              Frequency           Progress Toward Goals  OT Goals(current goals can now be found in the care plan section)  Progress towards OT goals: Progressing toward goals     Plan      Co-evaluation                 AM-PAC OT "6 Clicks" Daily Activity     Outcome  Measure   Help from another person eating meals?: None Help from another person taking care of personal grooming?: A Little Help from another person toileting, which includes using toliet, bedpan, or urinal?: A Little Help from another person bathing (including washing, rinsing, drying)?: A Little Help from another person to put on and taking off regular upper body clothing?: A Little Help from another person to put on and taking off regular lower body clothing?: A Little 6 Click Score: 19    End of Session        Activity Tolerance Patient tolerated treatment well   Patient Left in chair;with call bell/phone within reach   Nurse Communication          Time: 9833-8250 OT Time Calculation (min): 18 min  Charges: OT General Charges $OT Visit: 1 Visit OT Treatments $Self Care/Home Management : 8-22 mins  Lesle Chris, OTR/L Acute Rehabilitation Services [phone] [facility] pager [phone] office 05/08/2018   [place] 05/08/2018, 1:45 PM

## 2018-05-08 NOTE — Progress Notes (Signed)
Physical Therapy Treatment Patient Details Name: Katie Alvarado MRN: [id number] DOB: 07/18/1925 Today's Date: 05/08/2018    History of Present Illness  Katie Alvarado is a 85 year old individual who has developed severe and unremitting back pain that is become intractable. S/P steroid injection in interventional radiology.   PMH: lumbar decompression, DM2, HTN, Bronchiectasis , anxiety    PT Comments    Pt continues to mobilize well with RW with staff and daughter. Spoke to patient and daughter due to concern from MD that patient may need [place] PT and/or OT. At this time pt feels she does not and PT agrees as well. She Pt rogressing back to her baseline and will have someone with her for a while as she transitions home alone. Walked with RW in hall and pt reported a little" foggy" and reports due to medicines she took last night that she normally doesn't take. She states it is getting better and it did not effect her mobility at this time. Daughter at bedside during my conversation and agreed as well. Pt reported she would reach out to her primary if she felt she was not progressing at home like she should. No further PT needs at this time. Thank you .    Follow Up Recommendations  No PT follow up     Equipment Recommendations  None recommended by PT    Recommendations for Other Services       Precautions / Restrictions Precautions Precautions: Fall Precaution Comments: pt reports no falls in last 6 months Restrictions Weight Bearing Restrictions: No    Mobility  Bed Mobility               General bed mobility comments: cues for back precautions  Transfers Overall transfer level: Needs assistance Equipment used: Rolling walker (2 wheeled) Transfers: Sit to/from Stand Sit to Stand: Supervision         General transfer comment: cues for UE placement, posture  Ambulation/Gait Ambulation/Gait assistance: Supervision Gait Distance (Feet): 200 Feet Assistive device: Rolling  walker (2 wheeled) Gait Pattern/deviations: Step-through pattern     General Gait Details: good pace, some side stepping and backward steppign with no LOB    Stairs             Wheelchair Mobility    Modified Rankin (Stroke Patients Only)       Balance                                            Cognition Arousal/Alertness: Awake/alert Behavior During Therapy: WFL for tasks assessed/performed Overall Cognitive Status: Within Functional Limits for tasks assessed                                        Exercises      General Comments        Pertinent Vitals/Pain Pain Assessment: No/denies pain    Home Living                      Prior Function            PT Goals (current goals can now be found in the care plan section) Acute Rehab PT Goals Patient Stated Goal: decrease pain in back and L arm PT Goal Formulation: With patient/family Time For  Goal Achievement: 05/20/18 Potential to Achieve Goals: Good Progress towards PT goals: Progressing toward goals    Frequency    Min 3X/week      PT Plan      Co-evaluation              AM-PAC PT "6 Clicks" Mobility   Outcome Measure  Help needed turning from your back to your side while in a flat bed without using bedrails?: None Help needed moving from lying on your back to sitting on the side of a flat bed without using bedrails?: None Help needed moving to and from a bed to a chair (including a wheelchair)?: None Help needed standing up from a chair using your arms (e.g., wheelchair or bedside chair)?: None Help needed to walk in hospital room?: None Help needed climbing 3-5 steps with a railing? : None 6 Click Score: 24    End of Session   Activity Tolerance: Patient tolerated treatment well Patient left: with call bell/phone within reach;with family/visitor present;in chair Nurse Communication: Mobility status PT Visit Diagnosis: Unsteadiness on  feet (R26.81);Muscle weakness (generalized) (M62.81);Difficulty in walking, not elsewhere classified (R26.2);Pain Pain - Right/Left: Left Pain - part of body: Shoulder     Time: 0086-7619 PT Time Calculation (min) (ACUTE ONLY): 15 min  Charges:  $Gait Training: 8-22 mins                     Clide Dales, PT Acute Rehabilitation Services Pager: [phone] Office: [phone] 05/08/2018    Clide Dales 05/08/2018, 1:47 PM

## 2018-05-08 NOTE — Care Management Note (Signed)
Case Management Note  Patient Details  Name: JAVAYAH MAGAW MRN: [id number] Date of Birth: 1926-04-09  Subjective/Objective:low back pain. PT-no PT f/u. D/c home. No further CM needs.                    Action/Plan:dc home.   Expected Discharge Date:  05/08/18               Expected Discharge Plan:  Home/Self Care  In-House Referral:     Discharge planning Services  CM Consult  Post Acute Care Choice:    Choice offered to:     DME Arranged:    DME Agency:     HH Arranged:    HH Agency:     Status of Service:  Completed, signed off  If discussed at [employer] of Stay Meetings, dates discussed:    Additional Comments:  Dessa Phi, RN 05/08/2018, 1:45 PM

## 2018-05-08 NOTE — Discharge Summary (Signed)
Physician Discharge Summary  Katie Alvarado LEX:[id number] DOB: December 04, 1925 DOA: 05/04/2018  PCP: Hulan Fess, MD  Admit date: 05/04/2018 Discharge date: 05/08/2018  Admitted From: Home Disposition: Home  Outpatient Follow-up: 1. Follow up with PCP in 1-2 weeks 2. Please obtain BMP/CBC in one week  Home Health: PT Equipment/Devices: None Discharge Condition stable and improved CODE STATUS full code Diet recommendation cardiac Brief/Interim Summary:85 y.o.femalewithhistory of bronchiectasis, hypertension, diabetes mellitus, GERD, anxiety who was recently treated for pneumonia last dose of antibiotic was yesterday has been experiencing sudden onset of severe pain over the last 3 days. Pain is mostly in the low back radiating to her left lower extremity. Unable to ambulate because of the pain. Denies any incontinence of urine or bowel. Denies any fall. Denies any fever chills.  ED Course:In the ER patient had CT lumbar spine done which shows lumbar spondylosis with disc protrusion impinging on the exiting L4 on the right side causing severe foraminal stenosis. Dr. Ellene Route on-call neurosurgeon was consulted. Dr. Meda Coffee advised to give Decadron and get MRI in the morning. And to reconsult after MRI results Discharge Diagnoses:  Principal Problem:   Low back pain radiating down leg Active Problems:   DM type 2 (diabetes mellitus, type 2) (HCC)   Bronchiectasis without complication (HCC)   Essential hypertension   Malignant neoplasm of lower lip   Back pain   Low back pain   1. Low back pain secondary to severe foraminal stenosis at the L4-5.  Status post epidural steroid injection 05/06/2018 by IR.  Patient reports that she is able to move better.  Physical therapy assessed the patient and recommended SNF on discharge.TAPER  Decadron. 2. Normocytic normochromic anemia stable. 3. Diabetes mellitus type 2 we will keep patient on sliding scale coverage.  Restart  metformin. 4. History of bronchiectasis with recent treatment for pneumonia. Continue inhalers. 5. Hypertension on Cozaar and hydrochlorothiazide. 6. History of gout on allopurinol. 7. Anxiety on Xanax   Estimated body mass index is 22.12 kg/m as calculated from the following:   Height as of this encounter: 5\' 8"  (1.727 m).   Weight as of this encounter: 66 kg.  Discharge Instructions   Allergies as of 05/08/2018      Reactions   Welchol [colesevelam Hcl] Hives   Glucosamine Other (See Comments)   Unknown   Tramadol Nausea Only   Celecoxib Hives, Itching, Rash   Statins Rash      Medication List    STOP taking these medications   levofloxacin 750 MG tablet Commonly known as:  LEVAQUIN   lidocaine 2 % solution Commonly known as:  XYLOCAINE   ondansetron 4 MG tablet Commonly known as:  ZOFRAN     TAKE these medications   acetaminophen 325 MG tablet Commonly known as:  TYLENOL Take 2 tablets (650 mg total) by mouth every 6 (six) hours as needed for pain. What changed:  how much to take   ALPRAZolam 0.25 MG tablet Commonly known as:  XANAX Take 0.25 mg by mouth 2 (two) times daily as needed for sleep or anxiety. For anxiety.   amLODipine 2.5 MG tablet Commonly known as:  NORVASC Take 2.5 mg by mouth daily.   budesonide 0.25 MG/2ML nebulizer solution Commonly known as:  PULMICORT Take 2 mLs (0.25 mg total) by nebulization daily.   cholecalciferol 1000 units tablet Commonly known as:  VITAMIN D Take 2,000 Units by mouth every morning.   Cinnamon 500 MG capsule Take 500 mg by mouth every  morning.   cycloSPORINE 0.05 % ophthalmic emulsion Commonly known as:  RESTASIS Place 1 drop into both eyes 2 (two) times daily.   dexamethasone 0.5 MG tablet Commonly known as:  DECADRON Take 4 tablets daily for the first 3 days then 2 tablets daily till done.   DRY EYES OP Apply 1 drop to eye 2 (two) times daily as needed (for dry eyes).   esomeprazole 40 MG  capsule Commonly known as:  NEXIUM Take 40 mg by mouth every morning.   Fish Oil 1000 MG Caps Take 1 capsule by mouth daily.   Fluticasone-Umeclidin-Vilant 100-62.5-25 MCG/INH Aepb Commonly known as:  TRELEGY ELLIPTA Inhale 1 puff into the lungs daily.   FLUTTER Devi Use as directed   ipratropium 0.02 % nebulizer solution Commonly known as:  ATROVENT Take 2.5 mLs (500 mcg total) by nebulization 4 (four) times daily.   losartan 100 MG tablet Commonly known as:  COZAAR Take 100 mg by mouth daily.   meclizine 25 MG tablet Commonly known as:  ANTIVERT Take 25 mg by mouth every 6 (six) hours as needed for dizziness.   metFORMIN 500 MG tablet Commonly known as:  GLUCOPHAGE Take 1,000 mg by mouth 2 (two) times daily.   ONE TOUCH ULTRA TEST test strip Generic drug:  glucose blood USE TO CHECK BLOOD SUGAR ONCE DAILY 90   ONETOUCH DELICA LANCETS 26Z Misc USE TO CHECK BLOOD SUGAR ONCE DAILY AS DIRECTED   PROAIR HFA 108 (90 Base) MCG/ACT inhaler Generic drug:  albuterol INHALE 2 PUFFS INTO THE LUNGS EVERY 6 HOURS AS NEEDED FOR WHEEZING OR SHORTNESS OF BREATH What changed:  See the new instructions.   traMADol 50 MG tablet Commonly known as:  ULTRAM Take 25 mg by mouth every 6 (six) hours as needed for moderate pain.   triamterene-hydrochlorothiazide 37.5-25 MG tablet Commonly known as:  MAXZIDE-25 Take 1 tablet by mouth daily.   vitamin E 400 UNIT capsule Take 400 Units by mouth daily.      Follow-up Information    Hulan Fess, MD Follow up.   Specialty:  Family Medicine Contact information: [place] [address] [phone]          Allergies  Allergen Reactions  . Welchol [Colesevelam Hcl] Hives  . Glucosamine Other (See Comments)    Unknown  . Tramadol Nausea Only  . Celecoxib Hives, Itching and Rash  . Statins Rash    Consultations: IR  Procedures/Studies: Dg Chest 2 View  Result Date: 04/28/2018 CLINICAL DATA:   Follow-up pneumonia. Chest/rib pain. Cough. History of bronchiectasis. EXAM: CHEST - 2 VIEW COMPARISON:  04/20/2018, 06/24/2017, 10/31/2015.  CT 09/24/2016. FINDINGS: Mediastinum and hilar structures normal. Heart size stable. Stable bilateral chronic interstitial changes are present. Stable changes of left base bronchiectasis. No pleural effusion or pneumothorax. No acute bony abnormality. IMPRESSION: Stable bilateral chronic interstitial lung disease and left base bronchiectatic changes. No acute abnormality identified. Electronically Signed   By: Marcello Moores  Register   On: 04/28/2018 10:49   Ct Lumbar Spine Wo Contrast  Result Date: 05/04/2018 CLINICAL DATA:  85 y/o F; sudden onset of lower back pain for 2 days. Pain radiates to the left lower extremity. EXAM: CT LUMBAR SPINE WITHOUT CONTRAST TECHNIQUE: Multidetector CT imaging of the lumbar spine was performed without intravenous contrast administration. Multiplanar CT image reconstructions were also generated. COMPARISON:  06/19/2016 lumbar spine radiographs. Concurrent CT pelvis. 09/21/2014 lumbar spine myelogram. FINDINGS: Segmentation: 5 lumbar type vertebrae. Alignment: Mild lumbar levocurvature with  apex at L4. L4-5 grade 1 anterolisthesis without pars defect. Vertebrae: No acute fracture or focal pathologic process. Chronic postsurgical changes related to L4-5 laminectomy. Hemilaminectomy. Paraspinal and other soft tissues: Negative. Disc levels: Moderate spondylosis of the lumbar spine with loss of intervertebral disc space height greatest at the L2-3 and L5-S1 levels. Prominent lower lumbar facet arthrosis. At the right L4-5 level there is a foraminal disc protrusion impinging the exiting L4 nerve root and resulting in severe foraminal stenosis. Mild-to-moderate foraminal stenosis at additional levels. Additionally, at L4-5 there is multifactorial mild-to-moderate spinal canal stenosis. No high-grade spinal canal stenosis identified. IMPRESSION: 1. No  acute osseous abnormality identified. 2. Moderate lumbar spondylosis greatest the L4-5 level, stable from prior lumbar spine myelogram given differences in technique. 3. Right L4-5 foraminal disc protrusion impinging the exiting L4 nerve root and resulting in severe foraminal stenosis. 4. Mild-to-moderate spinal canal stenosis at L4-5. Electronically Signed   By: Kristine Garbe M.D.   On: 05/04/2018 22:31   Ct Pelvis Wo Contrast  Result Date: 05/04/2018 CLINICAL DATA:  85 y/o F; new onset lower back pain for 2 days. Lower back and pelvic pain radiates to the left. EXAM: CT PELVIS WITHOUT CONTRAST TECHNIQUE: Multidetector CT imaging of the pelvis was performed following the standard protocol without intravenous contrast. COMPARISON:  05/04/2018 lumbar spine CT. FINDINGS: Urinary Tract:  No abnormality visualized. Bowel: Appendectomy. Sigmoid diverticulosis without findings of diverticulitis. Vascular/Lymphatic: No pathologically enlarged lymph nodes. Aortic and iliofemoral calcific atherosclerosis. Reproductive:  Hysterectomy. Other:  None. Musculoskeletal: Hip joints and sacroiliac joints are well maintained. Degenerative changes of the symphysis pubis with osteophytosis and sclerosis of the articular surfaces. No acute fracture identified. IMPRESSION: 1. No acute fracture or dislocation identified. 2. Sigmoid diverticulosis without findings of diverticulitis. 3. Aortic and iliofemoral calcific atherosclerosis. Electronically Signed   By: Kristine Garbe M.D.   On: 05/04/2018 22:41   Mr Lumbar Spine Wo Contrast  Result Date: 05/05/2018 CLINICAL DATA:  Sudden onset of severe pain for 3 days, present in the lower back and radiating to the left lower extremity EXAM: MRI LUMBAR SPINE WITHOUT CONTRAST TECHNIQUE: Multiplanar, multisequence MR imaging of the lumbar spine was performed. No intravenous contrast was administered. COMPARISON:  Lumbar spine CT 05/04/2018 FINDINGS: Segmentation:  5  lumbar type vertebral bodies Alignment: Grade 1 anterolisthesis at L4-5. Slight retrolisthesis at L2-3. Mild levoscoliosis. Vertebrae: No fracture, evidence of discitis, or aggressive bone lesion. Conus medullaris and cauda equina: Conus extends to the L1-2 disc level. Conus and cauda equina appear normal. Paraspinal and other soft tissues: Negative Disc levels: T12- L1: Unremarkable. L1-L2: Disc narrowing and bulging.  No impingement L2-L3: Disc narrowing and left eccentric bulging. There is left subarticular recess narrowing without static L3 compression. Mild left foraminal stenosis. Mild facet spurring. L3-L4: Disc narrowing and bulging. Mild facet spurring. No neural compression L4-L5: Facet degeneration with advanced joint distortion and spurring. There is anterolisthesis and right preferential disc bulging. Severe right foraminal stenosis with L4 compression. Mild left foraminal narrowing. Mild to moderate spinal stenosis. The subarticular recesses are effaced but the descending L5 nerve roots are medial and non-compressed. Scarring related to prior surgery at this level. L5-S1:Advanced disc narrowing with bulging and endplate ridging. There is facet spurring. Moderate bilateral foraminal narrowing. Patent spinal canal. IMPRESSION: 1. No acute finding. 2. Multilevel disc and facet degeneration with L4-5 anterolisthesis. 3. L4-5 severe right foraminal impingement. Mild to moderate spinal stenosis at this level. 4. L5-S1 moderate bilateral foraminal narrowing. Electronically Signed  By: Monte Fantasia M.D.   On: 05/05/2018 08:05   Ir Epidurography  Result Date: 05/07/2018 CLINICAL DATA:  Right low back and left lower extremity pain. Multilevel disc and facet degeneration with L4-5 anterolisthesis. L4-5 severe right foraminal impingement. Mild to moderate spinal stenosis at this level. L5-S1 moderate bilateral foraminal narrowing. EXAM: LUMBAR EPIDURAL INJECTION: DIAGNOSTIC EPIDURAL INJECTION: THERAPEUTIC  EPIDURAL INJECTION: PROCEDURE: The procedure, risks, benefits, and alternatives were explained to the patient. Questions regarding the procedure were encouraged and answered. The patient understands and consents to the procedure. An interlaminar approach was performed on right at L5-S1 directed towards the midline. The overlying skin was cleansed and anesthetized. A 20 gauge epidural needle was advanced using loss-of-resistance technique. Injection of Isovue-M 200 shows a good epidural pattern with spread above and below the level of needle placement, primarily on the right and crossing midline. No vascular opacification is seen. 120mg  of Depo-Medrol mixed with 33ml lidocaine 1% were instilled. The procedure was well-tolerated, and the patient was discharged thirty minutes following the injection in good condition. FLUOROSCOPY TIME:  30 seconds; 4 mGy COMPLICATIONS: None immediate IMPRESSION: Technically successful epidural injection at L5-S1 as above. Electronically Signed   By: Lucrezia Europe M.D.   On: 05/07/2018 09:01    (Echo, Carotid, EGD, Colonoscopy, ERCP)    Subjective: Resting in bed in no acute distress reports she is able to move better   Discharge Exam: Vitals:   05/08/18 0521 05/08/18 0925  BP: (!) 154/70   Pulse: 69   Resp: 16   Temp: 97.7 F (36.5 C)   SpO2: 95% 99%   Vitals:   05/07/18 2143 05/08/18 0521 05/08/18 0636 05/08/18 0925  BP:  (!) 154/70    Pulse:  69    Resp:  16    Temp:  97.7 F (36.5 C)    TempSrc:  Oral    SpO2: 97% 95%  99%  Weight:   66 kg   Height:        General: Pt is alert, awake, not in acute distress Cardiovascular: RRR, S1/S2 +, no rubs, no gallops Respiratory: CTA bilaterally, no wheezing, no rhonchi Abdominal: Soft, NT, ND, bowel sounds + Extremities: no edema, no cyanosis    The results of significant diagnostics from this hospitalization (including imaging, microbiology, ancillary and laboratory) are listed below for reference.      Microbiology: No results found for this or any previous visit (from the past 240 hour(s)).   Labs: BNP (last 3 results) No results for input(s): BNP in the last 8760 hours. Basic Metabolic Panel: Recent Labs  Lab 05/04/18 2033 05/05/18 0444  NA 131* 131*  K 3.9 3.9  CL 96* 96*  CO2 23 22  GLUCOSE 167* 140*  BUN 22 18  CREATININE 0.94 0.91  CALCIUM 8.7* 8.6*   Liver Function Tests: No results for input(s): AST, ALT, ALKPHOS, BILITOT, PROT, ALBUMIN in the last 168 hours. No results for input(s): LIPASE, AMYLASE in the last 168 hours. No results for input(s): AMMONIA in the last 168 hours. CBC: Recent Labs  Lab 05/04/18 2033 05/05/18 0444  WBC 10.3 11.1*  NEUTROABS 7.4  --   HGB 10.8* 10.7*  HCT 33.7* 33.7*  MCV 91.1 92.8  PLT 236 228   Cardiac Enzymes: No results for input(s): CKTOTAL, CKMB, CKMBINDEX, TROPONINI in the last 168 hours. BNP: Invalid input(s): POCBNP CBG: Recent Labs  Lab 05/07/18 0758 05/07/18 1141 05/07/18 1656 05/07/18 2130 05/08/18 0728  GLUCAP 133* 226* 237*  183* 144*   D-Dimer No results for input(s): DDIMER in the last 72 hours. Hgb A1c No results for input(s): HGBA1C in the last 72 hours. Lipid Profile No results for input(s): CHOL, HDL, LDLCALC, TRIG, CHOLHDL, LDLDIRECT in the last 72 hours. Thyroid function studies No results for input(s): TSH, T4TOTAL, T3FREE, THYROIDAB in the last 72 hours.  Invalid input(s): FREET3 Anemia work up No results for input(s): VITAMINB12, FOLATE, FERRITIN, TIBC, IRON, RETICCTPCT in the last 72 hours. Urinalysis    Component Value Date/Time   COLORURINE STRAW (A) 05/04/2018 2033   APPEARANCEUR CLEAR 05/04/2018 2033   LABSPEC 1.006 05/04/2018 2033   PHURINE 6.0 05/04/2018 2033   GLUCOSEU NEGATIVE 05/04/2018 2033   HGBUR NEGATIVE 05/04/2018 2033   BILIRUBINUR NEGATIVE 05/04/2018 2033   KETONESUR NEGATIVE 05/04/2018 2033   PROTEINUR NEGATIVE 05/04/2018 2033   UROBILINOGEN 0.2 02/09/2013  1315   NITRITE NEGATIVE 05/04/2018 2033   LEUKOCYTESUR SMALL (A) 05/04/2018 2033   Sepsis Labs Invalid input(s): PROCALCITONIN,  WBC,  LACTICIDVEN Microbiology No results found for this or any previous visit (from the past 240 hour(s)).   Time coordinating discharge:  33 minutes  SIGNED:   Georgette Shell, MD  [facility] 05/08/2018, 10:48 AM Pager   If 7PM-7AM, please contact night-coverage [email or web address] Password [email or web address]

## 2018-05-08 NOTE — Care Management Important Message (Signed)
Important Message  Patient Details  Name: Katie Alvarado MRN: [id number] Date of Birth: 1925/12/27   Medicare Important Message Given:  Yes    Autumm Hattery 05/08/2018, 9:33 AM

## 2018-05-11 ENCOUNTER — Other Ambulatory Visit: Payer: Self-pay

## 2018-05-11 NOTE — Patient Outreach (Signed)
[facility]) Care Management  05/11/2018  Katie Alvarado May 10, 1925 [id number]    EMMI-General Discharge RED ON EMMI ALERT Day # 1 Date: 05/10/2018 Red Alert Reason: " Scheduled follow up? No"   Outreach attempt # 1 to patient. Spoke with patient who voices she is "doing pretty good." She states that she is till a little weak but that is normal and expected. Reviewed and addressed red alert with patient. She reports that PCP office called her this morning and she has appt scheduled for next Wednesday. Patient confirmed that she has transportation and in home assistance in the home. She voices that she has all her meds. She denies any issues or concerns regarding them. Advised patient that they would get one more automated EMMI-GENERAL post discharge calls to assess how they are doing following recent hospitalization and will receive a call from a nurse if any of their responses were abnormal. Patient voiced understanding and was appreciative of f/u call.    Plan: RN CM will close case as no further interventions needed at this time.  Enzo Montgomery, RN,BSN,CCM [place] [facility] Telephonic Care Management Coordinator Direct Phone: [phone] Toll Free: [phone] Fax: [phone]

## 2018-05-20 DIAGNOSIS — L570 Actinic keratosis: Secondary | ICD-10-CM | POA: Diagnosis not present

## 2018-05-20 DIAGNOSIS — D649 Anemia, unspecified: Secondary | ICD-10-CM | POA: Diagnosis not present

## 2018-05-20 DIAGNOSIS — L304 Erythema intertrigo: Secondary | ICD-10-CM | POA: Diagnosis not present

## 2018-05-20 DIAGNOSIS — E871 Hypo-osmolality and hyponatremia: Secondary | ICD-10-CM | POA: Diagnosis not present

## 2018-05-20 DIAGNOSIS — Z23 Encounter for immunization: Secondary | ICD-10-CM | POA: Diagnosis not present

## 2018-05-20 DIAGNOSIS — M5416 Radiculopathy, lumbar region: Secondary | ICD-10-CM | POA: Diagnosis not present

## 2018-05-20 DIAGNOSIS — J479 Bronchiectasis, uncomplicated: Secondary | ICD-10-CM | POA: Diagnosis not present

## 2018-05-26 DIAGNOSIS — D485 Neoplasm of uncertain behavior of skin: Secondary | ICD-10-CM | POA: Diagnosis not present

## 2018-05-26 DIAGNOSIS — D0462 Carcinoma in situ of skin of left upper limb, including shoulder: Secondary | ICD-10-CM | POA: Diagnosis not present

## 2018-05-26 DIAGNOSIS — L570 Actinic keratosis: Secondary | ICD-10-CM | POA: Diagnosis not present

## 2018-05-26 DIAGNOSIS — Z85828 Personal history of other malignant neoplasm of skin: Secondary | ICD-10-CM | POA: Diagnosis not present

## 2018-05-26 DIAGNOSIS — D0439 Carcinoma in situ of skin of other parts of face: Secondary | ICD-10-CM | POA: Diagnosis not present

## 2018-06-01 ENCOUNTER — Encounter: Payer: Self-pay | Admitting: Internal Medicine

## 2018-06-01 ENCOUNTER — Ambulatory Visit: Admission: RE | Admit: 2018-06-01 | Payer: Medicare Other | Source: Ambulatory Visit

## 2018-06-01 ENCOUNTER — Ambulatory Visit (INDEPENDENT_AMBULATORY_CARE_PROVIDER_SITE_OTHER): Payer: Medicare Other | Admitting: Internal Medicine

## 2018-06-01 ENCOUNTER — Ambulatory Visit (INDEPENDENT_AMBULATORY_CARE_PROVIDER_SITE_OTHER)
Admission: RE | Admit: 2018-06-01 | Discharge: 2018-06-01 | Disposition: A | Discharge status: Home / Self Care | Payer: Medicare Other | Source: Ambulatory Visit | Attending: Internal Medicine | Admitting: Internal Medicine

## 2018-06-01 VITALS — BP 130/78 | HR 98 | Temp 98.2°F | Ht 68.0 in | Wt 145.2 lb

## 2018-06-01 DIAGNOSIS — J479 Bronchiectasis, uncomplicated: Secondary | ICD-10-CM

## 2018-06-01 DIAGNOSIS — R0789 Other chest pain: Secondary | ICD-10-CM

## 2018-06-01 DIAGNOSIS — R0781 Pleurodynia: Secondary | ICD-10-CM | POA: Diagnosis not present

## 2018-06-01 MED ORDER — ACETAMINOPHEN-CODEINE #3 300-30 MG PO TABS: ORAL_TABLET | ORAL | 0 refills | Status: DC

## 2018-06-01 NOTE — Assessment & Plan Note (Signed)
She was treated for pneumonia last month.  There may not have been any more than bronchiectasis.  Currently I do not think respiratory infection is the primary issue causing her acute left chest pain.  Trelegy has helped at least some and can be continued.

## 2018-06-01 NOTE — Progress Notes (Signed)
HPI F never smoker, followed for bronchiectasis/ chronic bronchitis/ MAIC, complicated by hx of GERD, DM , arthritis, aortic atherosclerosis Sputum Cx 06/09/12 POS MAIC  Started 07/14/12- Biaxin 500mg  TIW, EMB 1200 TIW, Rifabutin 300 TIW  ended- 08/12/12 due to weakness and malaise,  CT chest 06/01/12- Progressive bilateral nodular bronchiectasis Office spirometry  01/18/13- moderate obstructive airways disease-FVC 2.10/69%, FEV1 1.25/58%,  FEV1/FVC  0.59/ 84%,  FEF 25-75% 0.47/33% --------------------------------------------------------------------------------  02/09/18- 85 year old female never smoker followed for bronchiectasis/chronic bronchitis/MAIC, complicated by history GERD, DM, arthritis,hyponatremia, aortic atherosclerosis -----She has had radiation therapy for skin cancer on lips.She is more SOB now. Pro-air HFA, neb ipratropium, neb budesonide, Had excision and XRT for skin cancer lower lip.  Discomfort interfered with eating and she lost a bit of weight.  Still markedly limited by arthritis and poor eyesight. Little change in chronic rattling cough intermittently productive.  She cannot see the color but does not notice fever.  06/01/2018- 85 year old female never smoker followed for bronchiectasis/chronic bronchitis/MAIC, complicated by history GERD, DM, arthritis,hyponatremia, aortic atherosclerosis Last seen here 04/28/2018 acute visit acute bronchiectasis exacerbation with left rib pain.  Hyperglycemic on prednisone and a Z-Pak given at urgent care for diagnosis of left lower lobe pneumonia.  Not sure the had comparison chest x-rays. ED visit 05/04/2022 herniated disc with sciatica -----Was diagnosed with pneumonia on New Year's Eve - still have pain when she coughs or breathing in deeply. Feels worse. Trelegy, pro-air HFA, Atrovent nasal spray She now points to focus of pleuritic sharp left chest wall pain in the lower mid axillary line but says it shifts by a couple of inches up and  down. CXR 04/28/2018- IMPRESSION: Stable bilateral chronic interstitial lung disease and left base bronchiectatic changes. No acute abnormality identified.  ROS-see HPI + = positive Constitutional:     No-weight loss, night sweats, no-fevers, chills,  +fatigue, lassitude. HEENT:   No-  headaches, difficulty swallowing, +tooth/dental problems, no-sore throat,       No-  sneezing, itching, ear ache, +nasal congestion, post nasal drip, + declining eyesight CV:  + chest pain, orthopnea, PND, swelling in lower extremities, anasarca,  dizziness, palpitations Resp: +Mild  shortness of breath with exertion or at rest.             +productive cough,  + non-productive cough,  No- coughing up of blood.              No-   change in color of mucus.  No- wheezing.   Skin: No-   rash or lesions. GI:  No-   heartburn, indigestion, abdominal pain, nausea, vomiting,  GU:  MS:  +Active low back and bilateral hip pain Neuro-     complaint of generalized weakness without myalgias Psych:  No- change in mood or affect. No depression or anxiety.  No memory loss.  Obj- Physical exam General- Alert, Oriented, Affect-appropriate, Distress- none acute.+ Always looks well/neatly groomed. Skin- rash-none,  excoriation- none Lymphadenopathy- none Head- atraumatic            Eyes- + decreased vision            Ears- Hearing, canals-normal            Nose- Clear, no-Septal dev, mucus, polyps, erosion, perforation             Throat- Mallampati II , mucosa very dry , drainage- none, tonsils- atrophic Neck- flexible , trachea midline, no stridor , thyroid nl, carotid no bruit Chest - symmetrical excursion ,  unlabored           Heart/CV- RRR , no murmur , no gallop  , no rub, nl s1 s2                           - JVD- none , edema- none, stasis changes- none, varices- none           Lung-   Cough+ rattling, + rhonchi., unlabored, no wheeze ,                                    dullness-none, rub-none. + no crepitus Chest  wall-  Abd-  Br/ Gen/ Rectal- Not done, not indicated Extrem-   No-clubbing, none, atrophy- none, strength- nl for age. + Cool/dusky feet Neuro- grossly intact to observation

## 2018-06-01 NOTE — Addendum Note (Signed)
Addended by: Valerie Salts on: 06/01/2018 12:25 PM   Modules accepted: Orders

## 2018-06-01 NOTE — Addendum Note (Signed)
Addended by: Valerie Salts on: 06/01/2018 12:27 PM   Modules accepted: Orders

## 2018-06-01 NOTE — Assessment & Plan Note (Signed)
This seems to be different from the lumbosacral pain she had in January and is more consistent with rib fracture in the left lower mid axillary line.  I cannot exclude pleurisy. Plan-CXR with left rib details, heating pad, Tylenol 3 used cautiously as discussed, brace for cough with pillow

## 2018-06-01 NOTE — Patient Instructions (Signed)
Order- CXR with left lower rib details     Dx pleuritic pain, chest wall pain  Script sent for Tylenol with codeine to use sparingly for pain  Use heating pad for comfort  Brace your ribs when you cough by holding a pillow under your arm.

## 2018-06-18 ENCOUNTER — Other Ambulatory Visit: Payer: Self-pay | Admitting: Internal Medicine

## 2018-06-24 DIAGNOSIS — H353114 Nonexudative age-related macular degeneration, right eye, advanced atrophic with subfoveal involvement: Secondary | ICD-10-CM | POA: Diagnosis not present

## 2018-07-07 NOTE — Progress Notes (Signed)
I called the patient today about their upcoming follow-up appointment in radiation oncology.   Given concerns about the COVID-19 pandemic, I offered a phone assessment with the patient to determine if coming to the clinic was necessary. The patient accepted.  I let the patient know that I had spoken with Dr. Isidore Moos, and she wanted them to know the importance of washing their hands for at least 20 seconds at a time, especially after going out in public, and before they eat. Limit going out in public whenever possible. Do not touch your face, unless your hands are clean, such as when bathing. Get plenty of rest, eat well, and stay hydrated.   Symptomatically, the patient is doing relatively well. They report no new lumps or masses at her radiation site. She feels well.   All questions were answered to the patient's satisfaction.  I encouraged the patient to call with any further questions. Otherwise, the plan is to schedule for follow up in 3 months.    Patient is pleased with this plan, and we will cancel their upcoming follow-up to reduce the risk of COVID-19 transmission.

## 2018-07-08 ENCOUNTER — Other Ambulatory Visit: Payer: Self-pay | Admitting: Radiation Oncology

## 2018-07-09 IMAGING — CR DG CHEST 2V
2 series · 2 of 2 positions shown · non-contrast
Comparison: 10/27/2015

CLINICAL DATA: Cough and fatigue

EXAM:
CHEST  2 VIEW

[w chest pa]
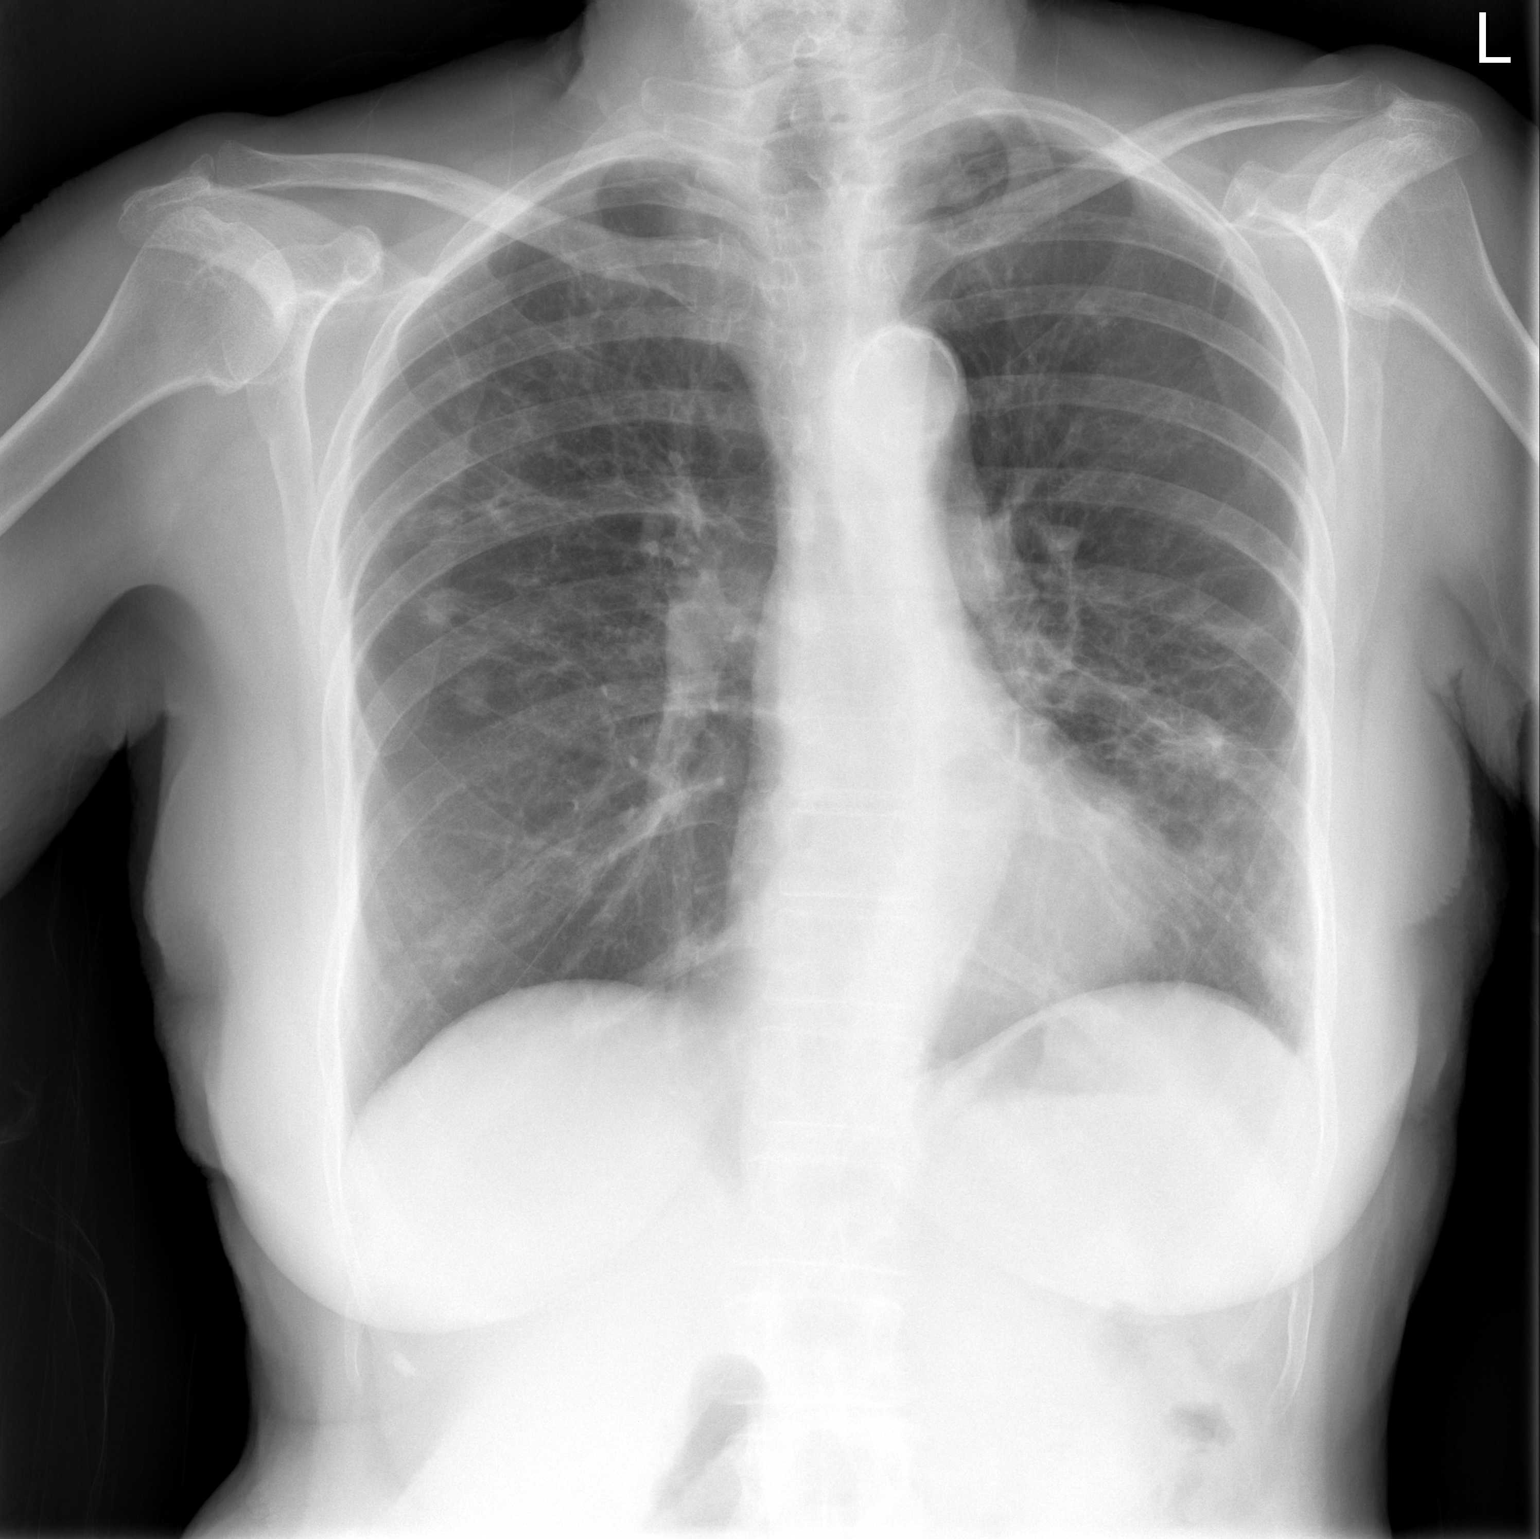

[w chest lat]
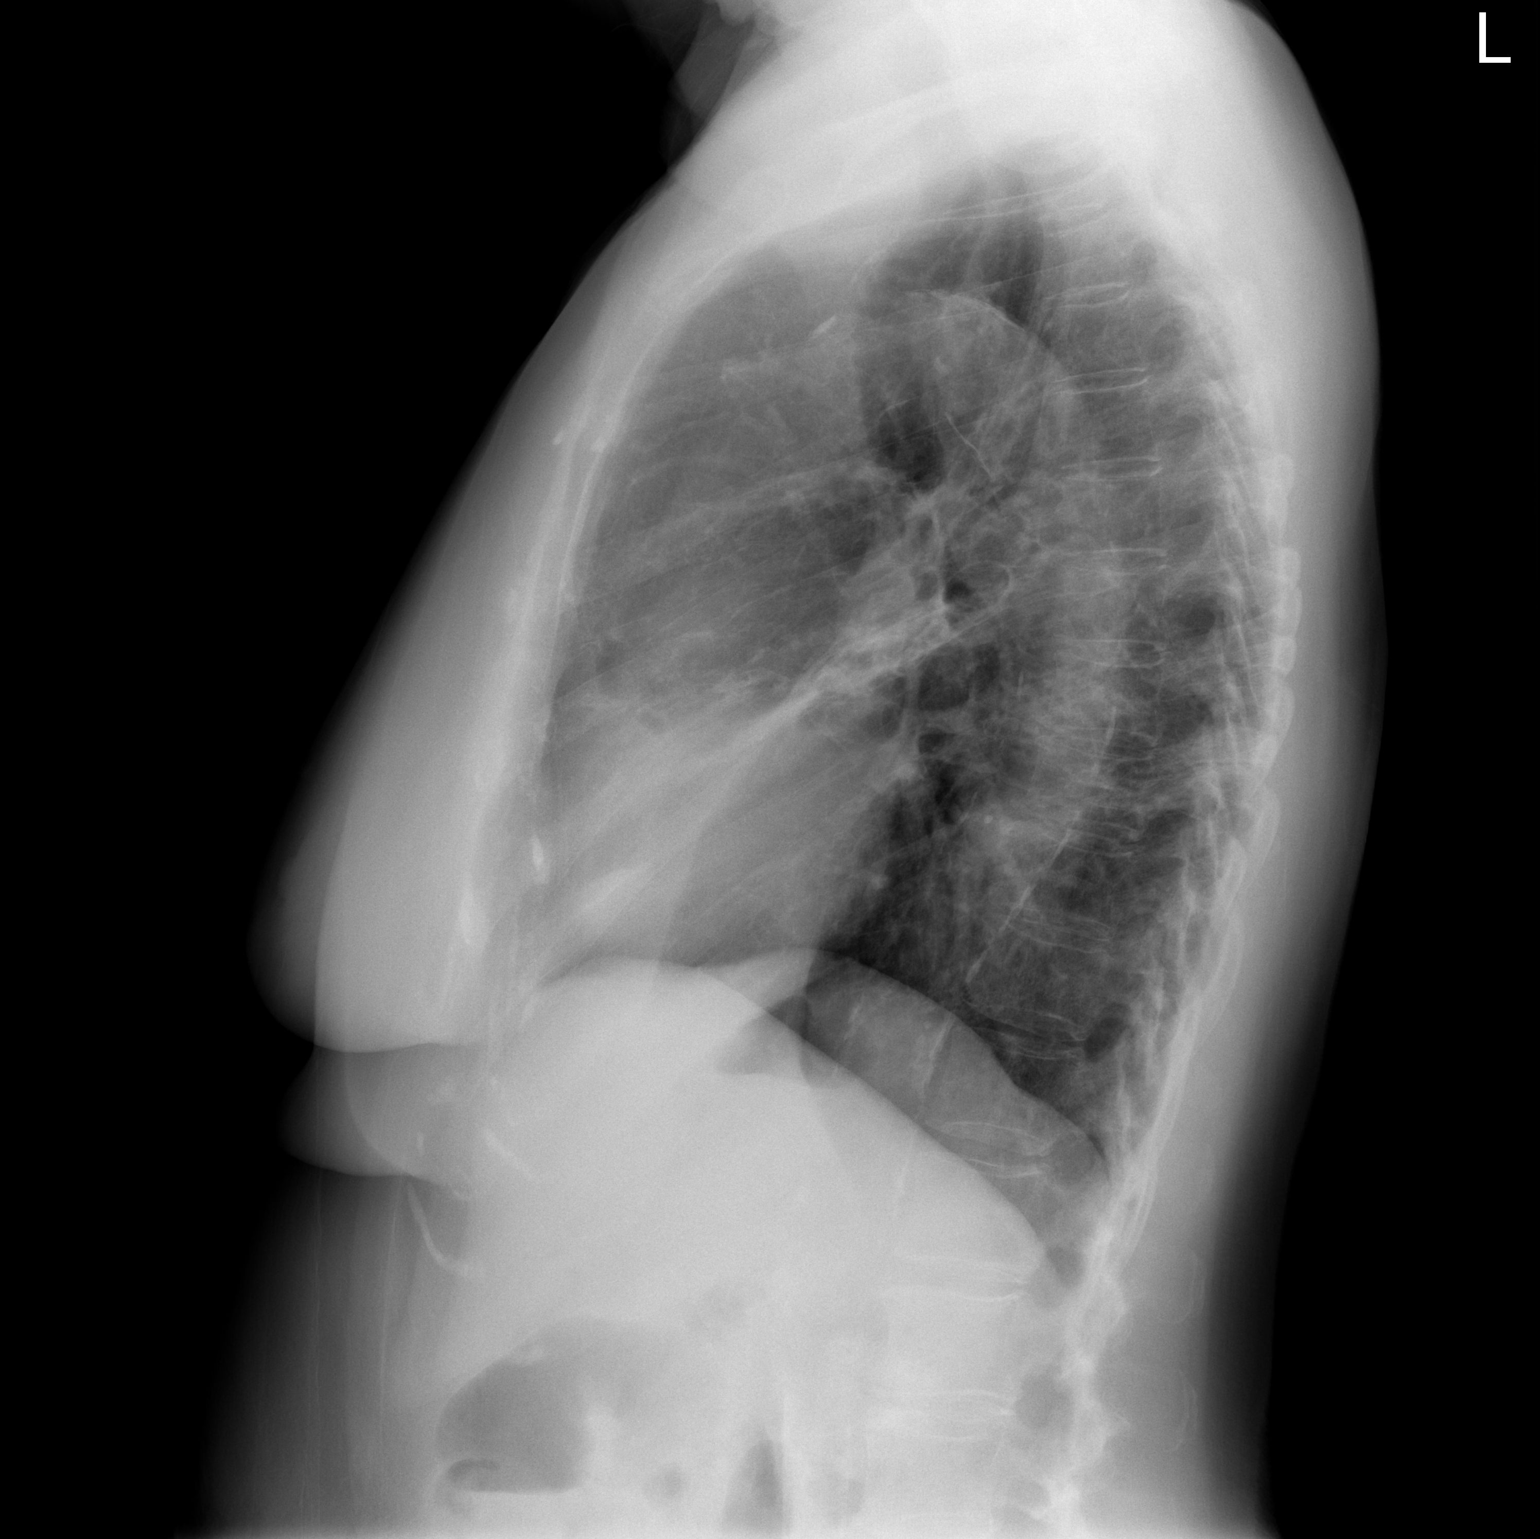

[2 of 2 positions shown; findings below may reference images not displayed]

FINDINGS: Cardiac shadow is stable. Patchy scarring is again identified
bilaterally similar to that seen on prior examinations. No new focal
infiltrate or sizable effusion is seen. Aortic calcifications are
again noted and stable. No bony abnormality is noted.
IMPRESSION: Patchy scarring bilaterally stable from the prior exam.

Aortic atherosclerosis.

## 2018-07-10 ENCOUNTER — Inpatient Hospital Stay
Admission: RE | Admit: 2018-07-10 | Discharge: 2018-07-10 | Disposition: A | Discharge status: Home / Self Care | Payer: Self-pay | Source: Ambulatory Visit | Attending: Radiation Oncology | Admitting: Radiation Oncology

## 2018-07-10 ENCOUNTER — Other Ambulatory Visit: Payer: Self-pay | Admitting: Internal Medicine

## 2018-07-10 DIAGNOSIS — A310 Pulmonary mycobacterial infection: Secondary | ICD-10-CM

## 2018-08-13 ENCOUNTER — Ambulatory Visit: Payer: Medicare Other | Admitting: Internal Medicine

## 2018-08-25 ENCOUNTER — Other Ambulatory Visit: Payer: Self-pay | Admitting: *Deleted

## 2018-08-28 ENCOUNTER — Ambulatory Visit: Payer: Medicare Other | Admitting: Internal Medicine

## 2018-09-08 DIAGNOSIS — H35423 Microcystoid degeneration of retina, bilateral: Secondary | ICD-10-CM | POA: Diagnosis not present

## 2018-09-08 DIAGNOSIS — H35433 Paving stone degeneration of retina, bilateral: Secondary | ICD-10-CM | POA: Diagnosis not present

## 2018-09-08 DIAGNOSIS — H353223 Exudative age-related macular degeneration, left eye, with inactive scar: Secondary | ICD-10-CM | POA: Diagnosis not present

## 2018-09-08 DIAGNOSIS — H353114 Nonexudative age-related macular degeneration, right eye, advanced atrophic with subfoveal involvement: Secondary | ICD-10-CM | POA: Diagnosis not present

## 2018-09-29 DIAGNOSIS — Z08 Encounter for follow-up examination after completed treatment for malignant neoplasm: Secondary | ICD-10-CM | POA: Diagnosis not present

## 2018-09-29 DIAGNOSIS — C44629 Squamous cell carcinoma of skin of left upper limb, including shoulder: Secondary | ICD-10-CM | POA: Diagnosis not present

## 2018-09-29 DIAGNOSIS — D485 Neoplasm of uncertain behavior of skin: Secondary | ICD-10-CM | POA: Diagnosis not present

## 2018-09-30 DIAGNOSIS — F419 Anxiety disorder, unspecified: Secondary | ICD-10-CM | POA: Diagnosis not present

## 2018-09-30 DIAGNOSIS — E785 Hyperlipidemia, unspecified: Secondary | ICD-10-CM | POA: Diagnosis not present

## 2018-09-30 DIAGNOSIS — I10 Essential (primary) hypertension: Secondary | ICD-10-CM | POA: Diagnosis not present

## 2018-09-30 DIAGNOSIS — M25561 Pain in right knee: Secondary | ICD-10-CM | POA: Diagnosis not present

## 2018-09-30 DIAGNOSIS — E118 Type 2 diabetes mellitus with unspecified complications: Secondary | ICD-10-CM | POA: Diagnosis not present

## 2018-09-30 DIAGNOSIS — D649 Anemia, unspecified: Secondary | ICD-10-CM | POA: Diagnosis not present

## 2018-09-30 DIAGNOSIS — G720 Drug-induced myopathy: Secondary | ICD-10-CM | POA: Diagnosis not present

## 2018-09-30 DIAGNOSIS — R809 Proteinuria, unspecified: Secondary | ICD-10-CM | POA: Diagnosis not present

## 2018-10-01 DIAGNOSIS — R809 Proteinuria, unspecified: Secondary | ICD-10-CM | POA: Diagnosis not present

## 2018-10-01 DIAGNOSIS — M25561 Pain in right knee: Secondary | ICD-10-CM | POA: Diagnosis not present

## 2018-10-01 DIAGNOSIS — F419 Anxiety disorder, unspecified: Secondary | ICD-10-CM | POA: Diagnosis not present

## 2018-10-01 DIAGNOSIS — G720 Drug-induced myopathy: Secondary | ICD-10-CM | POA: Diagnosis not present

## 2018-10-01 DIAGNOSIS — E118 Type 2 diabetes mellitus with unspecified complications: Secondary | ICD-10-CM | POA: Diagnosis not present

## 2018-10-01 DIAGNOSIS — D649 Anemia, unspecified: Secondary | ICD-10-CM | POA: Diagnosis not present

## 2018-10-01 DIAGNOSIS — I10 Essential (primary) hypertension: Secondary | ICD-10-CM | POA: Diagnosis not present

## 2018-10-01 DIAGNOSIS — E785 Hyperlipidemia, unspecified: Secondary | ICD-10-CM | POA: Diagnosis not present

## 2018-10-05 ENCOUNTER — Encounter: Payer: Self-pay | Admitting: Internal Medicine

## 2018-10-05 ENCOUNTER — Other Ambulatory Visit: Payer: Self-pay

## 2018-10-05 ENCOUNTER — Ambulatory Visit (INDEPENDENT_AMBULATORY_CARE_PROVIDER_SITE_OTHER): Payer: Medicare Other | Admitting: Internal Medicine

## 2018-10-05 DIAGNOSIS — M545 Low back pain: Secondary | ICD-10-CM | POA: Diagnosis not present

## 2018-10-05 DIAGNOSIS — J479 Bronchiectasis, uncomplicated: Secondary | ICD-10-CM

## 2018-10-05 DIAGNOSIS — M79604 Pain in right leg: Secondary | ICD-10-CM

## 2018-10-05 DIAGNOSIS — M79605 Pain in left leg: Secondary | ICD-10-CM

## 2018-10-05 DIAGNOSIS — M25561 Pain in right knee: Secondary | ICD-10-CM | POA: Diagnosis not present

## 2018-10-05 MED ORDER — PERFOROMIST 20 MCG/2ML IN NEBU: 20.0000 ug | INHALATION_SOLUTION | Freq: Two times a day (BID) | RESPIRATORY_TRACT | 0 refills | Status: DC

## 2018-10-05 MED ORDER — HYDROCOD POLST-CPM POLST ER 10-8 MG/5ML PO SUER: 5.0000 mL | Freq: Two times a day (BID) | ORAL | 0 refills | Status: DC | PRN

## 2018-10-05 NOTE — Progress Notes (Signed)
HPI F never smoker, followed for bronchiectasis/ chronic bronchitis/ MAIC, complicated by hx of GERD, DM , arthritis, aortic atherosclerosis Sputum Cx 06/09/12 POS MAIC  Started 07/14/12- Biaxin 500mg  TIW, EMB 1200 TIW, Rifabutin 300 TIW  ended- 08/12/12 due to weakness and malaise,  CT chest 06/01/12- Progressive bilateral nodular bronchiectasis Office spirometry  01/18/13- moderate obstructive airways disease-FVC 2.10/69%, FEV1 1.25/58%,  FEV1/FVC  0.59/ 84%,  FEF 25-75% 0.47/33% --------------------------------------------------------------------------------  06/01/2018- 85 year old female never smoker followed for bronchiectasis/chronic bronchitis/MAIC, complicated by history GERD, DM, arthritis,hyponatremia, aortic atherosclerosis Last seen here 04/28/2018 acute visit acute bronchiectasis exacerbation with left rib pain.  Hyperglycemic on prednisone and a Z-Pak given at urgent care for diagnosis of left lower lobe pneumonia.  Not sure the had comparison chest x-rays. ED visit 05/04/2022 herniated disc with sciatica -----Was diagnosed with pneumonia on New Year's Eve - still have pain when she coughs or breathing in deeply. Feels worse. Trelegy, pro-air HFA, Atrovent nasal spray She now points to focus of pleuritic sharp left chest wall pain in the lower mid axillary line but says it shifts by a couple of inches up and down. CXR 04/28/2018- IMPRESSION: Stable bilateral chronic interstitial lung disease and left base bronchiectatic changes. No acute abnormality identified.  10/05/2018-  85 year old female never smoker followed for bronchiectasis/chronic bronchitis/MAIC, complicated by history GERD, DM, arthritis,hyponatremia, aortic atherosclerosis, Lumbar DDD/ backpain  -----pt states breathing & cough (clear mucus) is worse since LOV, occasional chest tightness, denies wheezing Liked sample Trelegy but got Rx and not finding it helpful now. Cough is bad.  Neb pulmicort/ atrovent0.02%, ProAir,  Trelegy Thinks cough and dyspnea gradually worse maybe, but limited mostly by back and leg pains.Using neb 3-5 x daily with atrovent and budesonide due to hx shakiness with albuterol. CXR/  Left ribs 06/01/2018- 1. No rib fracture or rib lesion. 2. No acute cardiopulmonary disease. Chronic interstitial lung disease.  ROS-see HPI + = positive Constitutional:     No-weight loss, night sweats, no-fevers, chills,  +fatigue, lassitude. HEENT:   No-  headaches, difficulty swallowing, +tooth/dental problems, no-sore throat,       No-  sneezing, itching, ear ache, +nasal congestion, post nasal drip, + declining eyesight CV:  + chest pain, orthopnea, PND, swelling in lower extremities, anasarca,  dizziness, palpitations Resp: +Mild  shortness of breath with exertion or at rest.             +productive cough,  + non-productive cough,  No- coughing up of blood.              No-   change in color of mucus.  No- wheezing.   Skin: No-   rash or lesions. GI:  No-   heartburn, indigestion, abdominal pain, nausea, vomiting,  GU:  MS:  +Active low back and bilateral hip pain Neuro-     complaint of generalized weakness without myalgias Psych:  No- change in mood or affect. No depression or anxiety.  No memory loss.  Obj- Physical exam General- Alert, Oriented, Frail, NAD, gracious woman, trying to be stoic Skin- rash-none,  excoriation- none Lymphadenopathy- none Head- atraumatic            Eyes- + decreased vision            Ears- Hearing, canals-normal            Nose- Clear, no-Septal dev, mucus, polyps, erosion, perforation             Throat- Mallampati II , mucosa very dry ,  drainage- none, tonsils- atrophic Neck- flexible , trachea midline, no stridor , thyroid nl, carotid no bruit Chest - symmetrical excursion , unlabored           Heart/CV- RRR , no murmur , no gallop  , no rub, nl s1 s2                           - JVD- none , edema- none, stasis changes- none, varices- none           Lung-    Cough+ mild while here, + rhonchi L chest., unlabored, no wheeze ,                                    dullness-none, rub-none. + no crepitus Chest wall-  Abd-  Br/ Gen/ Rectal- Not done, not indicated Extrem-   No-clubbing, none, atrophy- none, strength- nl for age. + Cool/dusky feet Neuro- grossly intact to observation

## 2018-10-05 NOTE — Patient Instructions (Addendum)
Samples of Perforomist neb solution-   Try using 1 neb once or twice daily as needed for your breathing. This will make you shake. You can try turning the machine off half-way through and coming back to it later as a way to reduce the dose.  Ok to also take your other medicines as before  Script sent refilling cough syrup- use it very sparingly to avoid over sedation, as you have been.  I hope the orthopedist can help your hip pain this afternoon.

## 2018-10-06 NOTE — Assessment & Plan Note (Signed)
Exam is similar, but she feels she has very gradually worsened over the past year or two. No acute event. She agrees to try Perforomist neb samples, to see of LABA stimulation is tolerable with results better than her current neb solution.

## 2018-10-06 NOTE — Assessment & Plan Note (Signed)
She has appointment with orthopedics this afternoon.

## 2018-10-07 ENCOUNTER — Telehealth: Payer: Self-pay | Admitting: *Deleted

## 2018-10-07 NOTE — Telephone Encounter (Signed)
CALLED PATIENT TO ALTER FU ON 10-09-18 TO 10-30-18 @ 2:20 PM, PATIENT AGREED TO NEW DATE AND TIME

## 2018-10-07 NOTE — Telephone Encounter (Signed)
CALLED PATIENT TO ALTER FU APPT., NO ANSWER, WILL CALL LATER

## 2018-10-09 ENCOUNTER — Ambulatory Visit: Payer: Medicare Other | Admitting: Radiation Oncology

## 2018-10-09 ENCOUNTER — Telehealth: Payer: Self-pay | Admitting: Internal Medicine

## 2018-10-09 NOTE — Telephone Encounter (Signed)
Contacted patient by phone regarding need for refills on budesonide and ipratropium for nebulizer.  Patient states she is out of meds and the pharmacy ([facility]) states she needs authorization for refill by Dr. Annamaria Boots.  Patient states she is using as instructed and not sure why she cannot get a refill.  Appears both meds were ordered 07/13/18 with 11 refills.  Advised patient we would contact pharmacy and return call to her.    [place] at [phone] who advised patient has remaining refills and an order has been shipped and should arrive no later than today 10/09/18.  Verified that patient does have active remaining refills of 11 per pharmacist.    Returned call to patient and advised her to expect delivery today per pharmacy. Also gave patient the pharmacy number if order does not arrive as expected.  Patient has also been using performist without difficulty - no shakiness so far.  Patient advised to contact us if further concerns.  Encounter closed.

## 2018-10-26 DIAGNOSIS — M545 Low back pain: Secondary | ICD-10-CM | POA: Diagnosis not present

## 2018-10-28 DIAGNOSIS — M5136 Other intervertebral disc degeneration, lumbar region: Secondary | ICD-10-CM | POA: Insufficient documentation

## 2018-10-29 ENCOUNTER — Telehealth: Payer: Self-pay | Admitting: Radiation Oncology

## 2018-10-29 NOTE — Telephone Encounter (Signed)
Confirmed appt info

## 2018-10-30 ENCOUNTER — Encounter: Payer: Self-pay | Admitting: Radiation Oncology

## 2018-10-30 ENCOUNTER — Ambulatory Visit
Admission: RE | Admit: 2018-10-30 | Discharge: 2018-10-30 | Disposition: A | Discharge status: Home / Self Care | Payer: Medicare Other | Source: Ambulatory Visit | Attending: Radiation Oncology | Admitting: Radiation Oncology

## 2018-10-30 DIAGNOSIS — Z08 Encounter for follow-up examination after completed treatment for malignant neoplasm: Secondary | ICD-10-CM | POA: Diagnosis not present

## 2018-10-30 DIAGNOSIS — C001 Malignant neoplasm of external lower lip: Secondary | ICD-10-CM
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
## 2018-11-11 DIAGNOSIS — R1310 Dysphagia, unspecified: Secondary | ICD-10-CM | POA: Diagnosis not present

## 2018-11-12 ENCOUNTER — Other Ambulatory Visit: Payer: Self-pay | Admitting: Gastroenterology

## 2018-11-12 DIAGNOSIS — R1310 Dysphagia, unspecified: Secondary | ICD-10-CM

## 2018-11-17 ENCOUNTER — Ambulatory Visit
Admission: RE | Admit: 2018-11-17 | Discharge: 2018-11-17 | Disposition: A | Discharge status: Home / Self Care | Payer: Medicare Other | Source: Ambulatory Visit | Attending: Gastroenterology | Admitting: Gastroenterology

## 2018-11-17 DIAGNOSIS — R1310 Dysphagia, unspecified: Secondary | ICD-10-CM

## 2018-11-17 DIAGNOSIS — R1013 Epigastric pain: Secondary | ICD-10-CM | POA: Diagnosis not present

## 2018-11-18 DIAGNOSIS — C44622 Squamous cell carcinoma of skin of right upper limb, including shoulder: Secondary | ICD-10-CM | POA: Diagnosis not present

## 2018-12-17 ENCOUNTER — Telehealth: Payer: Self-pay | Admitting: Internal Medicine

## 2018-12-17 MED ORDER — LEVOFLOXACIN 500 MG PO TABS: 500.0000 mg | ORAL_TABLET | Freq: Every day | ORAL | 0 refills | Status: DC

## 2018-12-17 NOTE — Telephone Encounter (Signed)
Spoke with the pt and notified of recs per CDY  She verbalized understanding  Rx was sent to pharm  She wants to see CDY in 2-4 wks but there is nothing available on his schedule   Dr Annamaria Boots, do you want to work her in?  Please advise, thanks!

## 2018-12-17 NOTE — Telephone Encounter (Signed)
Unfortunately based off of patient's symptoms although this likely is a bronchiectatic exacerbation we cannot rule that out.  She is reporting fatigue, she is unable to assess her temperature, and her cough has increased.  Probably the most appropriate thing to do moving forward is:  Levaquin 500mg  tablet >>> Take 1 500 mg tablet daily for the next 7 days >>> Take with food >>> start probiotic for good gut health >>>avoid rigorous exercise for next 2 weeks   Empirically treat the patient with Levaquin.  Please place the order listed above.  Please emphasize and ensure the patient has been using her flutter valve.  Patient also should be using Mucinex.  Make sure that she is hydrating appropriately.  Can work to get patient scheduled with either Dr. Annamaria Boots or an APP in 2 to 4 weeks to ensure that patient symptoms are improving and that she is getting better.  Please also route this message to Dr. Annamaria Boots as an FYI so that way he is aware that she was having the symptoms.  Wyn Quaker FNP

## 2018-12-17 NOTE — Telephone Encounter (Signed)
Primary Pulmonologist: Young Last office visit and with whom: 10/05/18 What do we see them for (pulmonary problems): Bronchiectasis Last OV assessment/plan:  Assessment & Plan Note by Deneise Lever, MD at 10/06/2018 8:51 PM Author: Deneise Lever, MD Author Type: Physician Filed: 10/06/2018 8:53 PM  Note Status: Written Cosign: Cosign Not Required Encounter Date: 10/05/2018  Problem: Bronchiectasis without complication [facility])  Editor: Deneise Lever, MD (Physician)    Exam is similar, but she feels she has very gradually worsened over the past year or two. No acute event. She agrees to try Perforomist neb samples, to see of LABA stimulation is tolerable with results better than her current neb solution.                               Was appointment offered to patient (explain)? Wants to come in and be seen and get CXR-   Reason for call: Pt c/o severe left side back pain for the past few days, believes that she has PNA. She states pain occurs with movement but also worsens when she takes a deep breath. She states that pain is so severe that she yells out when is occurs and she did this several times while on the phone with me. She states her cough is slightly worse than usual, but no more sputum production than normal for her and it is clear in color. She states no increased SOB or wheezing. She does feel weak and tired "get up and just wanna go back to bed". No sore throat, loss and taste or smell, N/V/D.  She has had some chills and sweats off and on and I asked her to check her temp but she stated that she was unable b/c she is "basically blind" and unable to see thermometer reading. She is asking to come in and be seen. Aaron Edelman, please advise what you rec. Thanks

## 2018-12-18 NOTE — Telephone Encounter (Signed)
Dr. Annamaria Boots is out of office until 9/2, will leave open for him to advise upon return to office.

## 2018-12-19 ENCOUNTER — Ambulatory Visit (HOSPITAL_COMMUNITY)
Admission: EM | Admit: 2018-12-19 | Discharge: 2018-12-19 | Disposition: A | Discharge status: Home / Self Care | Payer: Medicare Other | Attending: Emergency Medicine | Admitting: Emergency Medicine

## 2018-12-19 ENCOUNTER — Other Ambulatory Visit: Payer: Self-pay

## 2018-12-19 DIAGNOSIS — G8929 Other chronic pain: Secondary | ICD-10-CM

## 2018-12-19 DIAGNOSIS — R350 Frequency of micturition: Secondary | ICD-10-CM

## 2018-12-19 LAB — POCT URINALYSIS DIP (DEVICE)
Bilirubin Urine: NEGATIVE
Glucose, UA: NEGATIVE mg/dL
Hgb urine dipstick: NEGATIVE
Ketones, ur: NEGATIVE mg/dL
Leukocytes,Ua: NEGATIVE
Nitrite: NEGATIVE
Protein, ur: 30 mg/dL — AB
Specific Gravity, Urine: 1.015 (ref 1.005–1.030)
Urobilinogen, UA: 0.2 mg/dL (ref 0.0–1.0)
pH: 7 (ref 5.0–8.0)

## 2018-12-19 NOTE — ED Triage Notes (Signed)
Pt sts only urinating a couple of drops a day x several days; pt sts some urgency but denies being able to go

## 2018-12-19 NOTE — ED Provider Notes (Signed)
[place]    CSN: [id number] Arrival date & time: 12/19/18  1127      History   Chief Complaint Chief Complaint  Patient presents with  . Dysuria    HPI Katie Alvarado is a 85 y.o. female.   Patient presents with concern for decreased urine output and mild urinary urgency x3-4 days.  She also reports pain in her left lower back from where she bumped it on her toilet seat booster yesterday.  She denies painful urination, fever, chills, abdominal pain, flank pain, or other symptoms.  She reports she is drinking water in good quantities and has a normal appetite.  Past medical history is significant for diabetes, UTI, acute renal failure, dehydration, musculoskeletal pain, back pain, and chest wall pain.  The history is provided by the patient.    Past Medical History:  Diagnosis Date  . Acute nasopharyngitis (common cold)   . Arthritis   . Bronchiectasis with acute exacerbation ([place])   . Cancer ([place])    skin cancer  . COPD (chronic obstructive pulmonary disease) ([place])    sees dr. Annamaria Boots   . Cough   . Diabetes mellitus    fasting 130-150  . Dizziness   . Esophageal reflux   . History of radiation therapy 10/24/17- 12/05/17   Lower lip, 4.4 Gy X 10 fractions given twice weekly for a total dose of 44 Gy.  [place] Kitchen Hypertension   . Insomnia, unspecified   . Other symptoms involving nervous and musculoskeletal systems(781.99)   . Pneumonia    hx of  . Shortness of breath    exertion    Patient Active Problem List   Diagnosis Date Noted  . Left-sided chest wall pain 06/01/2018  . Back pain 05/05/2018  . Low back pain radiating down leg 05/05/2018  . Low back pain 05/05/2018  . Pneumonia of left lower lobe due to infectious organism ([place]) 04/28/2018  . 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 Allergic urticaria 03/05/2016  . Gastritis 03/05/2016  . Chronic rhinitis 01/09/2016  . Pruritus 09/05/2015  . Atypical mycobacterial infection of lung ([place])   . Anemia  07/18/2015  . Bronchiectasis without complication ([place]) 0000000  . Renal artery stenosis ([place]) 04/25/2015  . Essential hypertension 04/25/2015  . Lower extremity pain, bilateral 08/04/2012  . Diarrhea 08/03/2012  . Klebsiella infection 08/01/2012  . DM type 2 (diabetes mellitus, type 2) ([place]) 07/29/2012  . Generalized weakness 07/29/2012  . Weakness 04/11/2011  . Hyponatremia 04/11/2011  . UTI (lower urinary tract infection) 04/11/2011  . ARF (acute renal failure) ([place]) 04/11/2011  . Leucocytosis 04/11/2011  . Dehydration 04/11/2011  . Hypotension 04/11/2011  . BRONCHIECTASIS, + [facility] 03/25/2010  . DIZZINESS 02/02/2010  . MUSCULOSKELETAL PAIN 09/25/2007  . INSOMNIA 05/20/2007  . ACUTE NASOPHARYNGITIS 04/20/2007  . Bronchiectasis with acute exacerbation ([place]) 04/20/2007  . Esophageal reflux 04/20/2007    Past Surgical History:  Procedure Laterality Date  . ABDOMINAL HYSTERECTOMY    . ANKLE FRACTURE SURGERY Left   . APPENDECTOMY    . BASAL CELL CARCINOMA EXCISION     NOSE  . CATARACT EXTRACTION, BILATERAL     OUT PATIENT  . COLON SURGERY     colon resection for diverticulitis  . IR EPIDUROGRAPHY  05/07/2018  . LUMBAR LAMINECTOMY/DECOMPRESSION MICRODISCECTOMY N/A 02/15/2013   Procedure: LUMBAR LAMINECTOMY/DECOMPRESSION MICRODISCECTOMY LUMBAR FOUR-FIVE;  Surgeon: Otilio Connors, MD;  Location: [place] [facility];  Service: Neurosurgery;  Laterality: N/A;  . VARICOSE VEIN SURGERY  OB History   No obstetric history on file.      Home Medications    Prior to Admission medications   Medication Sig Start Date End Date Taking? Authorizing Provider  acetaminophen (TYLENOL) 325 MG tablet Take 2 tablets (650 mg total) by mouth every 6 (six) hours as needed for pain. Patient taking differently: Take 325 mg by mouth every 6 (six) hours as needed for pain.  08/05/12   Eugenie Filler, MD  acetaminophen-codeine (TYLENOL #3) 300-30 MG tablet 1 every 8 hours if needed for pain  Patient not taking: Reported on 10/05/2018 06/01/18   Deneise Lever, MD  ALPRAZolam Duanne Moron) 0.25 MG tablet Take 0.25 mg by mouth 2 (two) times daily as needed for sleep or anxiety. For anxiety.    [provider]  amLODipine (NORVASC) 2.5 MG tablet Take 2.5 mg by mouth daily.  12/19/15   [provider]  Artificial Tear Ointment (DRY EYES OP) Apply 1 drop to eye 2 (two) times daily as needed (for dry eyes).     [provider]  budesonide (PULMICORT) 0.25 MG/2ML nebulizer solution USE 1 VIAL  IN  NEBULIZER TWICE  DAILY - rinse mouth after treatment 07/13/18   Baird Lyons D, MD  chlorpheniramine-HYDROcodone [facility] PENNKINETIC ER) 10-8 MG/5ML SUER Take 5 mLs by mouth every 12 (twelve) hours as needed for cough. 10/05/18   Baird Lyons D, MD  cholecalciferol (VITAMIN D) 1000 UNITS tablet Take 2,000 Units by mouth as needed.     [provider]  Cinnamon 500 MG capsule Take 500 mg by mouth as needed.     [provider]  cycloSPORINE (RESTASIS) 0.05 % ophthalmic emulsion Place 1 drop into both eyes 2 (two) times daily.    [provider]  esomeprazole (NEXIUM) 40 MG capsule Take 40 mg by mouth every morning.     [provider]  Fluticasone-Umeclidin-Vilant (TRELEGY ELLIPTA) 100-62.5-25 MCG/INH AEPB Inhale 1 puff into the lungs daily. 02/23/18   Baird Lyons D, MD  formoterol (PERFOROMIST) 20 MCG/2ML nebulizer solution Take 2 mLs (20 mcg total) by nebulization 2 (two) times daily. 10/05/18   Baird Lyons D, MD  ipratropium (ATROVENT) 0.02 % nebulizer solution USE 1 VIAL IN NEBULIZER 4 TIMES DAILY 07/13/18   Baird Lyons D, MD  levofloxacin (LEVAQUIN) 500 MG tablet Take 1 tablet (500 mg total) by mouth daily. 12/17/18   Lauraine Rinne, NP  losartan (COZAAR) 100 MG tablet Take 100 mg by mouth daily.  09/20/15   [provider]  meclizine (ANTIVERT) 25 MG tablet Take 25 mg by mouth every 6 (six) hours as needed for dizziness.   03/14/15   [provider]  metFORMIN (GLUCOPHAGE) 500 MG tablet Take 1,000 mg by mouth 2 (two) times daily.     [provider]  Omega-3 Fatty Acids (FISH OIL) 1000 MG CAPS Take 1 capsule by mouth daily.    [provider]  ONE TOUCH ULTRA TEST test strip USE TO CHECK BLOOD SUGAR ONCE DAILY 90 01/04/16   [provider]  [facility] DELICA LANCETS [id number] MISC USE TO CHECK BLOOD SUGAR ONCE DAILY AS DIRECTED 01/04/16   [provider]  PROAIR HFA 108 (90 Base) MCG/ACT inhaler INHALE 2 PUFFS INTO THE LUNGS EVERY 6 HOURS AS NEEDED FOR WHEEZING OR SHORTNESS OF BREATH 06/18/18   Deneise Lever, MD  Respiratory Therapy Supplies (FLUTTER) DEVI Use as directed 11/03/15   Deneise Lever, MD  traMADol (ULTRAM) 50 MG  tablet Take 0.5 tablets (25 mg total) by mouth every 6 (six) hours as needed for moderate pain. Patient not taking: Reported on 10/05/2018 05/08/18   Georgette Shell, MD  triamterene-hydrochlorothiazide (MAXZIDE-25) 37.5-25 MG tablet Take 1 tablet by mouth daily.    [provider]  vitamin E 400 UNIT capsule Take 400 Units by mouth daily.    [provider]    Family History Family History  Problem Relation Age of Onset  . Chronic bronchitis Father   . Other Sister        heart trouble  . Other Brother        heart trouble    Social History Social History   Tobacco Use  . Smoking status: Never Smoker  . Smokeless tobacco: Never Used  Substance Use Topics  . Alcohol use: Yes    Comment: "Hardly Ever"   . Drug use: No     Allergies   Welchol [colesevelam hcl], Glucosamine, Tramadol, Celecoxib, and Statins   Review of Systems Review of Systems  Constitutional: Negative for chills and fever.  HENT: Negative for ear pain and sore throat.   Eyes: Negative for pain and visual disturbance.  Respiratory: Negative for cough and shortness of breath.   Cardiovascular: Negative for chest pain and palpitations.   Gastrointestinal: Negative for abdominal pain and vomiting.  Genitourinary: Positive for difficulty urinating. Negative for dysuria and hematuria.  Musculoskeletal: Positive for back pain. Negative for arthralgias.  Skin: Negative for color change and rash.  Neurological: Negative for seizures and syncope.  All other systems reviewed and are negative.    Physical Exam Triage Vital Signs ED Triage Vitals  Enc Vitals Group     BP      Pulse      Resp      Temp      Temp src      SpO2      Weight      Height      Head Circumference      Peak Flow      Pain Score      Pain Loc      Pain Edu?      Excl. in [place]?    No data found.  Updated Vital Signs BP (!) 151/66 (BP Location: Right Arm)   Pulse 99   Temp 98.6 F (37 C) (Oral)   Resp 18   SpO2 97%   Visual Acuity Right Eye Distance:   Left Eye Distance:   Bilateral Distance:    Right Eye Near:   Left Eye Near:    Bilateral Near:     Physical Exam Vitals signs and nursing note reviewed.  Constitutional:      General: She is not in acute distress.    Appearance: She is well-developed.  HENT:     Head: Normocephalic and atraumatic.     Mouth/Throat:     Mouth: Mucous membranes are moist.     Pharynx: Oropharynx is clear.  Eyes:     Conjunctiva/sclera: Conjunctivae normal.  Neck:     Musculoskeletal: Neck supple.  Cardiovascular:     Rate and Rhythm: Normal rate and regular rhythm.     Heart sounds: No murmur.  Pulmonary:     Effort: Pulmonary effort is normal. No respiratory distress.     Breath sounds: Normal breath sounds.  Abdominal:     Palpations: Abdomen is soft.     Tenderness: There is no abdominal tenderness. There is no  right CVA tenderness, left CVA tenderness, guarding or rebound.  Musculoskeletal:     Right lower leg: No edema.     Left lower leg: No edema.  Skin:    General: Skin is warm and dry.     Findings: No bruising, erythema, lesion or rash.  Neurological:     General: No focal  deficit present.     Mental Status: She is alert and oriented to person, place, and time.      UC Treatments / Results  Labs (all labs ordered are listed, but only abnormal results are displayed) Labs Reviewed  POCT URINALYSIS DIP (DEVICE) - Abnormal; Notable for the following components:      Result Value   Protein, ur 30 (*)    All other components within normal limits    EKG   Radiology No results found.  Procedures Procedures (including critical care time)  Medications Ordered in UC Medications - No data to display  Initial Impression / Assessment and Plan / UC Course  I have reviewed the triage vital signs and the nursing notes.  Pertinent labs & imaging results that were available during my care of the patient were reviewed by me and considered in my medical decision making (see chart for details).   Urinary frequency, chronic left lower back pain without sciatica.  Urine dip positive for protein, negative LE and nitrite, specific gravity 1.015.  Patient is well-appearing and her exam is unremarkable.  Instructed her to follow-up with her primary care provider on Monday.  Instructed her to return here or go to the ED if she has worsening symptoms, decreased urinary output, fever, chills, abdominal pain, or other concerns.  Patient agrees with plan of care.     Final Clinical Impressions(s) / UC Diagnoses   Final diagnoses:  Urinary frequency  Chronic left-sided low back pain without sciatica     Discharge Instructions     Your urine appeared normal today and did not show signs of dehydration or infection.    Please follow-up with your primary care provider on Monday.    Return here or go to the emergency department if you have worsening symptoms, or not putting out enough urine, or have other symptoms such as fever, chills, or abdominal pain.        ED Prescriptions    None     Controlled Substance Prescriptions [place] Controlled Substance Registry  consulted? Not Applicable   Sharion Balloon, NP 12/19/18 1237

## 2018-12-19 NOTE — Discharge Instructions (Signed)
Your urine appeared normal today and did not show signs of dehydration or infection.    Please follow-up with your primary care provider on Monday.    Return here or go to the emergency department if you have worsening symptoms, or not putting out enough urine, or have other symptoms such as fever, chills, or abdominal pain.

## 2018-12-21 DIAGNOSIS — M25512 Pain in left shoulder: Secondary | ICD-10-CM | POA: Diagnosis not present

## 2018-12-21 NOTE — Telephone Encounter (Signed)
Attempted to contact patient by phone.  Noted a few openings on Dr. Janee Morn schedule but unable to reach patient.  Left voicemail for her to call back to see if she could get scheduled in one of Dr. Janee Morn currently available slots.

## 2018-12-22 NOTE — Telephone Encounter (Signed)
Noted. Appt for 12/24/2018 at 3:00 PM EDT has been scheduled. Nothing further needed at this time.

## 2018-12-22 NOTE — Telephone Encounter (Signed)
Pt is scheduled 9/3 for appt w/Dr. Annamaria Boots.

## 2018-12-24 ENCOUNTER — Ambulatory Visit (INDEPENDENT_AMBULATORY_CARE_PROVIDER_SITE_OTHER): Payer: Medicare Other | Admitting: Internal Medicine

## 2018-12-24 ENCOUNTER — Other Ambulatory Visit: Payer: Self-pay

## 2018-12-24 ENCOUNTER — Ambulatory Visit (INDEPENDENT_AMBULATORY_CARE_PROVIDER_SITE_OTHER): Payer: Medicare Other

## 2018-12-24 ENCOUNTER — Encounter: Payer: Self-pay | Admitting: Internal Medicine

## 2018-12-24 VITALS — BP 120/60 | HR 89 | Temp 98.2°F | Ht 68.0 in | Wt 136.2 lb

## 2018-12-24 DIAGNOSIS — R0781 Pleurodynia: Secondary | ICD-10-CM

## 2018-12-24 DIAGNOSIS — J479 Bronchiectasis, uncomplicated: Secondary | ICD-10-CM | POA: Diagnosis not present

## 2018-12-24 DIAGNOSIS — D473 Essential (hemorrhagic) thrombocythemia: Secondary | ICD-10-CM

## 2018-12-24 DIAGNOSIS — R0789 Other chest pain: Secondary | ICD-10-CM

## 2018-12-24 DIAGNOSIS — Z23 Encounter for immunization: Secondary | ICD-10-CM

## 2018-12-24 DIAGNOSIS — D75839 Thrombocytosis, unspecified: Secondary | ICD-10-CM

## 2018-12-24 NOTE — Progress Notes (Signed)
HPI F never smoker, followed for bronchiectasis/ chronic bronchitis/ MAIC, complicated by hx of GERD, DM , arthritis, aortic atherosclerosis Sputum Cx 06/09/12 POS [facility]  Started 07/14/12- Biaxin 500mg  TIW, EMB 1200 TIW, Rifabutin 300 TIW  ended- 08/12/12 due to weakness and malaise,  CT chest 06/01/12- Progressive bilateral nodular bronchiectasis Office spirometry  01/18/13- moderate obstructive airways disease-FVC 2.10/69%, FEV1 1.25/58%,  FEV1/FVC  0.59/ 84%,  FEF 25-75% 0.47/33% --------------------------------------------------------------------------------   10/05/2018-  85 year old female never smoker followed for bronchiectasis/chronic bronchitis/MAIC, complicated by history GERD, DM, arthritis,hyponatremia, aortic atherosclerosis, Lumbar DDD/ backpain  -----pt states breathing & cough (clear mucus) is worse since LOV, occasional chest tightness, denies wheezing Liked sample Trelegy but got Rx and not finding it helpful now. Cough is bad.  Neb pulmicort/ atrovent0.02%, ProAir, Trelegy Thinks cough and dyspnea gradually worse maybe, but limited mostly by back and leg pains.Using neb 3-5 x daily with atrovent and budesonide due to hx shakiness with albuterol. CXR/  Left ribs 06/01/2018- 1. No rib fracture or rib lesion. 2. No acute cardiopulmonary disease. Chronic interstitial lung Disease.  12/24/2018- 85 year old female never smoker followed for bronchiectasis/chronic bronchitis/MAIC, complicated by history GERD, DM, arthritis,hyponatremia, aortic atherosclerosis, Lumbar DDD/ backpain -----pt states her breathing is at baseline; however, reports when she breathes deep, she feels pain in her left lung area and left arm She feels weak and has lost weight- about 10 lbs since January. Denies fever, bleed or change in cough. Still trying to live here alone, rather than to move in with daughter in [place]. Continues Trelegy. About 3 weeks ago she leaned over and fell against bedside commode, hurting  Left lat ribs. Persistent focal pleuritic L mid axillary line pain since then. Painful L shoulder managed with steroid taper and inj by Dr Gladstone Lighter- inoperable due to her age/ health.  Barium swallow 11/17/2018- Esophagram is normal for age.  No aspiration of barium occurred. CXR 12/24/2018-  IMPRESSION: Scattered chronic lung changes bilaterally, stable. No acute abnormalities. Labs 12/24/2018-  Glu 165, Na 128, BUN 34, Creat 0.89                            WBC 11, 800 (on steroids), Hgb 11.3, PLATELETS 636 K   ROS-see HPI + = positive Constitutional:     +weight loss, night sweats, no-fevers, chills,  +fatigue, lassitude. HEENT:   No-  headaches, difficulty swallowing, +tooth/dental problems, no-sore throat,       No-  sneezing, itching, ear ache, +nasal congestion, post nasal drip, + declining eyesight CV:  + chest pain, orthopnea, PND, swelling in lower extremities, anasarca,  dizziness, palpitations Resp: +Mild  shortness of breath with exertion or at rest.             +productive cough,  + non-productive cough,  No- coughing up of blood.              No-   change in color of mucus.  No- wheezing.   Skin: No-   rash or lesions. GI:  No-   heartburn, indigestion, abdominal pain, nausea, vomiting,  GU:  MS:  +Active low back and bilateral hip pain. L rib and L shoulder pain Neuro-     complaint of generalized weakness without myalgias Psych:  No- change in mood or affect. No depression or anxiety.  No memory loss.  Obj- Physical exam General- Alert, Oriented, Frail, NAD, gracious woman, trying to be stoic Skin- rash-none,  excoriation- none Lymphadenopathy-  none Head- atraumatic            Eyes- + decreased vision            Ears- Hearing, canals-normal            Nose- Clear, no-Septal dev, mucus, polyps, erosion, perforation             Throat- Mallampati II , mucosa clear , drainage- none, tonsils- atrophic Neck- flexible , trachea midline, no stridor , thyroid nl, carotid no  bruit Chest - symmetrical excursion , unlabored           Heart/CV- RRR , no murmur , no gallop  , no rub, nl s1 s2                           - JVD- none , edema- none, stasis changes- none, varices- none           Lung-   Cough+ mild while here, + rhonchi L chest., unlabored, no wheeze ,                                    dullness-none, rub-none. + no crepitus Chest wall- + Point tender L mid axillary line w/ o rub Abd-  Br/ Gen/ Rectal- Not done, not indicated Extrem-   No-clubbing, none, atrophy- none, strength- nl for age. + Cool/dusky feet Neuro- grossly intact to observation

## 2018-12-24 NOTE — Patient Instructions (Addendum)
Order- CXR , Left rib details      Dx bronchiectasis, left lateral rib pain  Order- lab- CBC w diff, CMET     Dx bronchiectasis  Order- flu vax senior

## 2018-12-25 DIAGNOSIS — D473 Essential (hemorrhagic) thrombocythemia: Secondary | ICD-10-CM | POA: Insufficient documentation

## 2018-12-25 DIAGNOSIS — D75839 Thrombocytosis, unspecified: Secondary | ICD-10-CM | POA: Insufficient documentation

## 2018-12-25 LAB — CBC WITH DIFFERENTIAL/PLATELET
Basophils Absolute: 0.1 10*3/uL (ref 0.0–0.1)
Basophils Relative: 0.5 % (ref 0.0–3.0)
Eosinophils Absolute: 0 10*3/uL (ref 0.0–0.7)
Eosinophils Relative: 0.1 % (ref 0.0–5.0)
HCT: 33.9 % — ABNORMAL LOW (ref 36.0–46.0)
Hemoglobin: 11.3 g/dL — ABNORMAL LOW (ref 12.0–15.0)
Lymphocytes Relative: 9.5 % — ABNORMAL LOW (ref 12.0–46.0)
Lymphs Abs: 1.1 10*3/uL (ref 0.7–4.0)
MCHC: 33.2 g/dL (ref 30.0–36.0)
MCV: 86 fl (ref 78.0–100.0)
Monocytes Absolute: 0.9 10*3/uL (ref 0.1–1.0)
Monocytes Relative: 7.5 % (ref 3.0–12.0)
Neutro Abs: 9.7 10*3/uL — ABNORMAL HIGH (ref 1.4–7.7)
Neutrophils Relative %: 82.4 % — ABNORMAL HIGH (ref 43.0–77.0)
Platelets: 636 10*3/uL — ABNORMAL HIGH (ref 150.0–400.0)
RBC: 3.94 Mil/uL (ref 3.87–5.11)
RDW: 13.6 % (ref 11.5–15.5)
WBC: 11.8 10*3/uL — ABNORMAL HIGH (ref 4.0–10.5)

## 2018-12-25 LAB — COMPREHENSIVE METABOLIC PANEL
ALT: 15 U/L (ref 0–35)
AST: 26 U/L (ref 0–37)
Albumin: 4.3 g/dL (ref 3.5–5.2)
Alkaline Phosphatase: 59 U/L (ref 39–117)
BUN: 34 mg/dL — ABNORMAL HIGH (ref 6–23)
CO2: 27 mEq/L (ref 19–32)
Calcium: 10.3 mg/dL (ref 8.4–10.5)
Chloride: 91 mEq/L — ABNORMAL LOW (ref 96–112)
Creatinine, Ser: 0.89 mg/dL (ref 0.40–1.20)
GFR: 59.44 mL/min — ABNORMAL LOW (ref >60.00)
Glucose, Bld: 165 mg/dL — ABNORMAL HIGH (ref 70–99)
Potassium: 4.7 mEq/L (ref 3.5–5.1)
Sodium: 128 mEq/L — ABNORMAL LOW (ref 135–145)
Total Bilirubin: 0.3 mg/dL (ref 0.2–1.2)
Total Protein: 7.8 g/dL (ref 6.0–8.3)

## 2018-12-25 NOTE — Assessment & Plan Note (Signed)
MSCWP- probable green-stick rib fx to be treated supportively- heat, brace with pillow, analgesics as tolerated.

## 2018-12-25 NOTE — Assessment & Plan Note (Signed)
CXR is stable and I don't think there has been much change in respiratory symptoms with chronic cough. Weakness and chronic pain are more limiting. Trelegy may help a little. Plan- senior flu vax

## 2018-12-25 NOTE — Assessment & Plan Note (Signed)
[id number] on 12/24/2018. On recent steroids. No change on CXR, but suspect this is related to her weight loss and worry there may be an occult neoplasm.  I will direct this to Dr Rex Kras, her PCP for his consideration. At age 85 options may be limited and she will likely want to be conservative.

## 2019-01-05 ENCOUNTER — Other Ambulatory Visit: Payer: Self-pay

## 2019-01-05 DIAGNOSIS — Z20822 Contact with and (suspected) exposure to covid-19: Secondary | ICD-10-CM

## 2019-01-07 ENCOUNTER — Encounter (HOSPITAL_BASED_OUTPATIENT_CLINIC_OR_DEPARTMENT_OTHER): Payer: Self-pay | Admitting: *Deleted

## 2019-01-07 ENCOUNTER — Encounter (HOSPITAL_COMMUNITY): Payer: Self-pay

## 2019-01-07 ENCOUNTER — Emergency Department (HOSPITAL_COMMUNITY)
Admission: EM | Admit: 2019-01-07 | Discharge: 2019-01-07 | Discharge status: Against Medical Advice | Payer: Medicare Other | Source: Home / Self Care

## 2019-01-07 ENCOUNTER — Other Ambulatory Visit: Payer: Self-pay

## 2019-01-07 ENCOUNTER — Emergency Department (HOSPITAL_BASED_OUTPATIENT_CLINIC_OR_DEPARTMENT_OTHER): Payer: Medicare Other

## 2019-01-07 ENCOUNTER — Inpatient Hospital Stay (HOSPITAL_BASED_OUTPATIENT_CLINIC_OR_DEPARTMENT_OTHER)
Admission: EM | Admit: 2019-01-07 | Discharge: 2019-01-10 | DRG: 190 | Disposition: A | Discharge status: Home Health | Payer: Medicare Other | Attending: Internal Medicine | Admitting: Internal Medicine

## 2019-01-07 DIAGNOSIS — E1165 Type 2 diabetes mellitus with hyperglycemia: Secondary | ICD-10-CM

## 2019-01-07 DIAGNOSIS — K219 Gastro-esophageal reflux disease without esophagitis: Secondary | ICD-10-CM | POA: Diagnosis present

## 2019-01-07 DIAGNOSIS — Z7951 Long term (current) use of inhaled steroids: Secondary | ICD-10-CM | POA: Diagnosis not present

## 2019-01-07 DIAGNOSIS — Z85819 Personal history of malignant neoplasm of unspecified site of lip, oral cavity, and pharynx: Secondary | ICD-10-CM

## 2019-01-07 DIAGNOSIS — Z881 Allergy status to other antibiotic agents status: Secondary | ICD-10-CM | POA: Diagnosis not present

## 2019-01-07 DIAGNOSIS — Z7984 Long term (current) use of oral hypoglycemic drugs: Secondary | ICD-10-CM | POA: Diagnosis not present

## 2019-01-07 DIAGNOSIS — Z79899 Other long term (current) drug therapy: Secondary | ICD-10-CM | POA: Diagnosis not present

## 2019-01-07 DIAGNOSIS — E871 Hypo-osmolality and hyponatremia: Secondary | ICD-10-CM | POA: Diagnosis present

## 2019-01-07 DIAGNOSIS — J181 Lobar pneumonia, unspecified organism: Secondary | ICD-10-CM | POA: Diagnosis present

## 2019-01-07 DIAGNOSIS — J441 Chronic obstructive pulmonary disease with (acute) exacerbation: Secondary | ICD-10-CM | POA: Diagnosis present

## 2019-01-07 DIAGNOSIS — J479 Bronchiectasis, uncomplicated: Secondary | ICD-10-CM | POA: Diagnosis not present

## 2019-01-07 DIAGNOSIS — Z923 Personal history of irradiation: Secondary | ICD-10-CM | POA: Diagnosis not present

## 2019-01-07 DIAGNOSIS — E860 Dehydration: Secondary | ICD-10-CM | POA: Diagnosis present

## 2019-01-07 DIAGNOSIS — Z885 Allergy status to narcotic agent status: Secondary | ICD-10-CM

## 2019-01-07 DIAGNOSIS — Z66 Do not resuscitate: Secondary | ICD-10-CM | POA: Diagnosis present

## 2019-01-07 DIAGNOSIS — Z85828 Personal history of other malignant neoplasm of skin: Secondary | ICD-10-CM

## 2019-01-07 DIAGNOSIS — N179 Acute kidney failure, unspecified: Secondary | ICD-10-CM | POA: Diagnosis present

## 2019-01-07 DIAGNOSIS — Z9049 Acquired absence of other specified parts of digestive tract: Secondary | ICD-10-CM | POA: Diagnosis not present

## 2019-01-07 DIAGNOSIS — J440 Chronic obstructive pulmonary disease with acute lower respiratory infection: Secondary | ICD-10-CM | POA: Diagnosis not present

## 2019-01-07 DIAGNOSIS — Z20828 Contact with and (suspected) exposure to other viral communicable diseases: Secondary | ICD-10-CM | POA: Diagnosis present

## 2019-01-07 DIAGNOSIS — I10 Essential (primary) hypertension: Secondary | ICD-10-CM | POA: Diagnosis present

## 2019-01-07 DIAGNOSIS — R5381 Other malaise: Secondary | ICD-10-CM | POA: Diagnosis not present

## 2019-01-07 DIAGNOSIS — Z90710 Acquired absence of both cervix and uterus: Secondary | ICD-10-CM | POA: Diagnosis not present

## 2019-01-07 DIAGNOSIS — Z825 Family history of asthma and other chronic lower respiratory diseases: Secondary | ICD-10-CM

## 2019-01-07 DIAGNOSIS — J310 Chronic rhinitis: Secondary | ICD-10-CM | POA: Diagnosis present

## 2019-01-07 DIAGNOSIS — J189 Pneumonia, unspecified organism: Secondary | ICD-10-CM

## 2019-01-07 DIAGNOSIS — Z888 Allergy status to other drugs, medicaments and biological substances status: Secondary | ICD-10-CM

## 2019-01-07 DIAGNOSIS — R531 Weakness: Secondary | ICD-10-CM | POA: Diagnosis not present

## 2019-01-07 DIAGNOSIS — R0902 Hypoxemia: Secondary | ICD-10-CM | POA: Diagnosis not present

## 2019-01-07 DIAGNOSIS — R0602 Shortness of breath: Secondary | ICD-10-CM | POA: Diagnosis not present

## 2019-01-07 DIAGNOSIS — E119 Type 2 diabetes mellitus without complications: Secondary | ICD-10-CM | POA: Diagnosis not present

## 2019-01-07 DIAGNOSIS — Z79891 Long term (current) use of opiate analgesic: Secondary | ICD-10-CM | POA: Diagnosis not present

## 2019-01-07 DIAGNOSIS — R05 Cough: Secondary | ICD-10-CM | POA: Diagnosis not present

## 2019-01-07 DIAGNOSIS — R0781 Pleurodynia: Secondary | ICD-10-CM | POA: Diagnosis not present

## 2019-01-07 DIAGNOSIS — R000 Tachycardia, unspecified: Secondary | ICD-10-CM | POA: Diagnosis not present

## 2019-01-07 DIAGNOSIS — R7989 Other specified abnormal findings of blood chemistry: Secondary | ICD-10-CM | POA: Diagnosis not present

## 2019-01-07 DIAGNOSIS — R1012 Left upper quadrant pain: Secondary | ICD-10-CM | POA: Diagnosis not present

## 2019-01-07 DIAGNOSIS — G4700 Insomnia, unspecified: Secondary | ICD-10-CM | POA: Diagnosis present

## 2019-01-07 DIAGNOSIS — I951 Orthostatic hypotension: Secondary | ICD-10-CM | POA: Diagnosis not present

## 2019-01-07 DIAGNOSIS — Z5321 Procedure and treatment not carried out due to patient leaving prior to being seen by health care provider: Secondary | ICD-10-CM | POA: Insufficient documentation

## 2019-01-07 LAB — URINALYSIS, ROUTINE W REFLEX MICROSCOPIC
Bilirubin Urine: NEGATIVE
Glucose, UA: NEGATIVE mg/dL
Hgb urine dipstick: NEGATIVE
Ketones, ur: NEGATIVE mg/dL
Nitrite: NEGATIVE
Protein, ur: NEGATIVE mg/dL
Specific Gravity, Urine: 1.01 (ref 1.005–1.030)
pH: 5.5 (ref 5.0–8.0)

## 2019-01-07 LAB — COMPREHENSIVE METABOLIC PANEL
ALT: 14 U/L (ref 0–44)
AST: 16 U/L (ref 15–41)
Albumin: 3.1 g/dL — ABNORMAL LOW (ref 3.5–5.0)
Alkaline Phosphatase: 82 U/L (ref 38–126)
Anion gap: 15 (ref 5–15)
BUN: 28 mg/dL — ABNORMAL HIGH (ref 8–23)
CO2: 19 mmol/L — ABNORMAL LOW (ref 22–32)
Calcium: 8.3 mg/dL — ABNORMAL LOW (ref 8.9–10.3)
Chloride: 86 mmol/L — ABNORMAL LOW (ref 98–111)
Creatinine, Ser: 1.55 mg/dL — ABNORMAL HIGH (ref 0.44–1.00)
GFR calc Af Amer: 34 mL/min — ABNORMAL LOW (ref >60)
GFR calc non Af Amer: 29 mL/min — ABNORMAL LOW (ref >60)
Glucose, Bld: 172 mg/dL — ABNORMAL HIGH (ref 70–99)
Potassium: 4.3 mmol/L (ref 3.5–5.1)
Sodium: 120 mmol/L — ABNORMAL LOW (ref 135–145)
Total Bilirubin: 0.6 mg/dL (ref 0.3–1.2)
Total Protein: 7 g/dL (ref 6.5–8.1)

## 2019-01-07 LAB — CBC WITH DIFFERENTIAL/PLATELET
Abs Immature Granulocytes: 0.15 10*3/uL — ABNORMAL HIGH (ref 0.00–0.07)
Basophils Absolute: 0.1 10*3/uL (ref 0.0–0.1)
Basophils Relative: 1 %
Eosinophils Absolute: 0.2 10*3/uL (ref 0.0–0.5)
Eosinophils Relative: 1 %
HCT: 33.1 % — ABNORMAL LOW (ref 36.0–46.0)
Hemoglobin: 10.7 g/dL — ABNORMAL LOW (ref 12.0–15.0)
Immature Granulocytes: 1 %
Lymphocytes Relative: 9 %
Lymphs Abs: 1.4 10*3/uL (ref 0.7–4.0)
MCH: 28 pg (ref 26.0–34.0)
MCHC: 32.3 g/dL (ref 30.0–36.0)
MCV: 86.6 fL (ref 80.0–100.0)
Monocytes Absolute: 1.4 10*3/uL — ABNORMAL HIGH (ref 0.1–1.0)
Monocytes Relative: 9 %
Neutro Abs: 12.1 10*3/uL — ABNORMAL HIGH (ref 1.7–7.7)
Neutrophils Relative %: 79 %
Platelets: 403 10*3/uL — ABNORMAL HIGH (ref 150–400)
RBC: 3.82 MIL/uL — ABNORMAL LOW (ref 3.87–5.11)
RDW: 13.2 % (ref 11.5–15.5)
WBC: 15.3 10*3/uL — ABNORMAL HIGH (ref 4.0–10.5)
nRBC: 0 % (ref 0.0–0.2)

## 2019-01-07 LAB — URINALYSIS, MICROSCOPIC (REFLEX)

## 2019-01-07 LAB — TSH: TSH: 2.852 u[IU]/mL (ref 0.350–4.500)

## 2019-01-07 LAB — LACTIC ACID, PLASMA
Lactic Acid, Venous: 1.6 mmol/L (ref 0.5–1.9)
Lactic Acid, Venous: 1.4 mmol/L (ref 0.5–1.9)

## 2019-01-07 LAB — NOVEL CORONAVIRUS, NAA: SARS-CoV-2, NAA: NOT DETECTED

## 2019-01-07 MED ORDER — METFORMIN HCL 500 MG PO TABS
1000.0000 mg | ORAL_TABLET | Freq: Two times a day (BID) | ORAL | Status: DC
  Administered 2019-01-07: 1000 mg via ORAL
  Filled 2019-01-07: qty 2

## 2019-01-07 MED ORDER — ACETAMINOPHEN 500 MG PO TABS
500.0000 mg | ORAL_TABLET | Freq: Once | ORAL | Status: AC
  Administered 2019-01-08: 500 mg via ORAL
  Filled 2019-01-07: qty 1

## 2019-01-07 MED ORDER — ACETAMINOPHEN 325 MG PO TABS
650.0000 mg | ORAL_TABLET | Freq: Once | ORAL | Status: AC
  Administered 2019-01-07: 16:00:00 650 mg via ORAL
  Filled 2019-01-07: qty 2

## 2019-01-07 MED ORDER — IPRATROPIUM-ALBUTEROL 0.5-2.5 (3) MG/3ML IN SOLN
3.0000 mL | Freq: Once | RESPIRATORY_TRACT | Status: AC
  Administered 2019-01-07: 20:00:00 3 mL via RESPIRATORY_TRACT

## 2019-01-07 MED ORDER — ENOXAPARIN SODIUM 30 MG/0.3ML ~~LOC~~ SOLN: 30.0000 mg | Freq: Every day | SUBCUTANEOUS | Status: DC

## 2019-01-07 MED ORDER — ARFORMOTEROL TARTRATE 15 MCG/2ML IN NEBU
15.0000 ug | INHALATION_SOLUTION | Freq: Two times a day (BID) | RESPIRATORY_TRACT | Status: DC
  Filled 2019-01-07: qty 2

## 2019-01-07 MED ORDER — IPRATROPIUM BROMIDE 0.02 % IN SOLN: 0.2500 mg | Freq: Four times a day (QID) | RESPIRATORY_TRACT | Status: DC | PRN

## 2019-01-07 MED ORDER — ALBUTEROL SULFATE (2.5 MG/3ML) 0.083% IN NEBU
2.5000 mg | INHALATION_SOLUTION | Freq: Four times a day (QID) | RESPIRATORY_TRACT | Status: DC | PRN
  Administered 2019-01-08: 06:00:00 2.5 mg via RESPIRATORY_TRACT
  Filled 2019-01-07: qty 3

## 2019-01-07 MED ORDER — AZITHROMYCIN 250 MG PO TABS
500.0000 mg | ORAL_TABLET | Freq: Once | ORAL | Status: AC
  Administered 2019-01-07: 15:00:00 500 mg via ORAL
  Filled 2019-01-07: qty 2

## 2019-01-07 MED ORDER — SODIUM CHLORIDE 0.9 % IV SOLN
1.0000 g | Freq: Once | INTRAVENOUS | Status: AC
  Administered 2019-01-07: 15:00:00 1 g via INTRAVENOUS
  Filled 2019-01-07: qty 10

## 2019-01-07 MED ORDER — CYCLOSPORINE 0.05 % OP EMUL
1.0000 [drp] | Freq: Two times a day (BID) | OPHTHALMIC | Status: DC
  Administered 2019-01-08 – 2019-01-10 (×5): 1 [drp] via OPHTHALMIC
  Filled 2019-01-07 (×7): qty 30

## 2019-01-07 MED ORDER — INSULIN ASPART 100 UNIT/ML ~~LOC~~ SOLN
0.0000 [IU] | Freq: Every day | SUBCUTANEOUS | Status: DC
  Administered 2019-01-08: 5 [IU] via SUBCUTANEOUS
  Administered 2019-01-09: 20:00:00 2 [IU] via SUBCUTANEOUS

## 2019-01-07 MED ORDER — SODIUM CHLORIDE 0.9 % IV BOLUS
1000.0000 mL | Freq: Once | INTRAVENOUS | Status: AC
  Administered 2019-01-07: 1000 mL via INTRAVENOUS

## 2019-01-07 MED ORDER — BENZONATATE 100 MG PO CAPS
100.0000 mg | ORAL_CAPSULE | Freq: Once | ORAL | Status: AC
  Administered 2019-01-07: 100 mg via ORAL
  Filled 2019-01-07: qty 1

## 2019-01-07 MED ORDER — SODIUM CHLORIDE 0.9 % IV BOLUS
500.0000 mL | Freq: Once | INTRAVENOUS | Status: AC
  Administered 2019-01-07: 500 mL via INTRAVENOUS

## 2019-01-07 MED ORDER — FLUTICASONE FUROATE-VILANTEROL 100-25 MCG/INH IN AEPB
1.0000 | INHALATION_SPRAY | Freq: Every day | RESPIRATORY_TRACT | Status: DC
  Administered 2019-01-08 – 2019-01-10 (×3): 1 via RESPIRATORY_TRACT
  Filled 2019-01-07 (×2): qty 28

## 2019-01-07 MED ORDER — SODIUM CHLORIDE 0.9 % IV SOLN
INTRAVENOUS | Status: AC
  Administered 2019-01-07: via INTRAVENOUS

## 2019-01-07 MED ORDER — FLUTICASONE-UMECLIDIN-VILANT 100-62.5-25 MCG/INH IN AEPB: 1.0000 | INHALATION_SPRAY | Freq: Every day | RESPIRATORY_TRACT | Status: DC

## 2019-01-07 MED ORDER — BUDESONIDE 0.25 MG/2ML IN SUSP
0.2500 mg | Freq: Two times a day (BID) | RESPIRATORY_TRACT | Status: DC
  Filled 2019-01-07: qty 2

## 2019-01-07 MED ORDER — INSULIN ASPART 100 UNIT/ML ~~LOC~~ SOLN
0.0000 [IU] | Freq: Three times a day (TID) | SUBCUTANEOUS | Status: DC
  Administered 2019-01-08 (×2): 3 [IU] via SUBCUTANEOUS
  Administered 2019-01-08: 2 [IU] via SUBCUTANEOUS
  Administered 2019-01-09: 16:00:00 5 [IU] via SUBCUTANEOUS
  Administered 2019-01-09: 09:00:00 3 [IU] via SUBCUTANEOUS
  Administered 2019-01-09: 2 [IU] via SUBCUTANEOUS
  Administered 2019-01-10: 12:00:00 3 [IU] via SUBCUTANEOUS
  Administered 2019-01-10: 2 [IU] via SUBCUTANEOUS

## 2019-01-07 MED ORDER — MORPHINE SULFATE (PF) 4 MG/ML IV SOLN
4.0000 mg | Freq: Once | INTRAVENOUS | Status: AC
  Administered 2019-01-07: 16:00:00 4 mg via INTRAVENOUS
  Filled 2019-01-07: qty 1

## 2019-01-07 MED ORDER — SODIUM CHLORIDE 0.9 % IV SOLN
1.0000 g | INTRAVENOUS | Status: DC
  Administered 2019-01-08 – 2019-01-10 (×3): 1 g via INTRAVENOUS
  Filled 2019-01-07: qty 1
  Filled 2019-01-07: qty 10
  Filled 2019-01-07: qty 1

## 2019-01-07 MED ORDER — SODIUM CHLORIDE 0.9 % IV SOLN
500.0000 mg | INTRAVENOUS | Status: DC
  Filled 2019-01-07: qty 500

## 2019-01-07 MED ORDER — UMECLIDINIUM BROMIDE 62.5 MCG/INH IN AEPB
1.0000 | INHALATION_SPRAY | Freq: Every day | RESPIRATORY_TRACT | Status: DC
  Administered 2019-01-08 – 2019-01-10 (×3): 1 via RESPIRATORY_TRACT
  Filled 2019-01-07 (×2): qty 7

## 2019-01-07 MED ORDER — ALBUTEROL SULFATE HFA 108 (90 BASE) MCG/ACT IN AERS
1.0000 | INHALATION_SPRAY | Freq: Four times a day (QID) | RESPIRATORY_TRACT | Status: DC | PRN
  Filled 2019-01-07: qty 6.7

## 2019-01-07 MED ORDER — ACETAMINOPHEN 500 MG PO TABS
1000.0000 mg | ORAL_TABLET | Freq: Four times a day (QID) | ORAL | Status: DC | PRN
  Administered 2019-01-07 – 2019-01-09 (×2): 1000 mg via ORAL
  Administered 2019-01-09: 500 mg via ORAL
  Filled 2019-01-07 (×3): qty 2

## 2019-01-07 MED ORDER — ENSURE ENLIVE PO LIQD
237.0000 mL | Freq: Two times a day (BID) | ORAL | Status: DC
  Administered 2019-01-08 – 2019-01-10 (×3): 237 mL via ORAL

## 2019-01-07 MED ORDER — IPRATROPIUM-ALBUTEROL 0.5-2.5 (3) MG/3ML IN SOLN
RESPIRATORY_TRACT | Status: AC
  Administered 2019-01-07: 3 mL via RESPIRATORY_TRACT
  Filled 2019-01-07: qty 3

## 2019-01-07 MED ORDER — PANTOPRAZOLE SODIUM 40 MG PO TBEC
40.0000 mg | DELAYED_RELEASE_TABLET | Freq: Every day | ORAL | Status: DC
  Administered 2019-01-08 – 2019-01-10 (×3): 40 mg via ORAL
  Filled 2019-01-07 (×3): qty 1

## 2019-01-07 NOTE — ED Provider Notes (Signed)
[facility] EMERGENCY DEPARTMENT Provider Note   CSN: [place]:[id number] Arrival date & time: 01/07/19  1340     History   Chief Complaint Chief Complaint  Patient presents with  . Weakness    HPI LAIANNA Alvarado is a 85 y.o. female.     The history is provided by the patient, medical records and a relative. No language interpreter was used.  Weakness Associated symptoms: cough (Chronic - no acute change.)    Katie Alvarado is a 85 y.o. female  with a PMH as listed below including COPD, DM who presents to the Emergency Department from primary care for generalized weakness over the last month.  Family at bedside states that she was orthostatic in the office and her sodium level was low, therefore they sent her to the hospital for IV hydration and further work-up.  Patient states that she has been feeling weak for the last month, so much so, that she has trouble even getting out of bed or doing basic activities like walking to the bathroom.  She just feels miserable.  Denies any abdominal pain, nausea or vomiting.  Has had any recent negative coronavirus testing also denies any fever.  Has COPD with chronic cough, but denies any increased sputum production or worsening cough /shortness of breath than usual.  No chest pain.  No back pain.  No urinary symptoms.  No headache.  Past Medical History:  Diagnosis Date  . Acute nasopharyngitis (common cold)   . Arthritis   . Bronchiectasis with acute exacerbation ([place])   . Cancer ([place])    skin cancer  . COPD (chronic obstructive pulmonary disease) ([place])    sees dr. Annamaria Boots   . Cough   . Diabetes mellitus    fasting 130-150  . Dizziness   . Esophageal reflux   . History of radiation therapy 10/24/17- 12/05/17   Lower lip, 4.4 Gy X 10 fractions given twice weekly for a total dose of 44 Gy.  Marland Kitchen Hypertension   . Insomnia, unspecified   . Other symptoms involving nervous and musculoskeletal systems(781.99)   . Pneumonia    hx of  .  Shortness of breath    exertion    Patient Active Problem List   Diagnosis Date Noted  . Thrombocytosis ([place]) 12/25/2018  . Left-sided chest wall pain 06/01/2018  . Back pain 05/05/2018  . Low back pain radiating down leg 05/05/2018  . Low back pain 05/05/2018  . Pneumonia of left lower lobe due to infectious organism ([place]) 04/28/2018  . 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 Allergic urticaria 03/05/2016  . Gastritis 03/05/2016  . Chronic rhinitis 01/09/2016  . Pruritus 09/05/2015  . 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 Anemia 07/18/2015  . Bronchiectasis without complication ([place]) 0000000  . Renal artery stenosis ([place]) 04/25/2015  . Essential hypertension 04/25/2015  . Lower extremity pain, bilateral 08/04/2012  . Diarrhea 08/03/2012  . Klebsiella infection 08/01/2012  . DM type 2 (diabetes mellitus, type 2) ([place]) 07/29/2012  . Generalized weakness 07/29/2012  . Weakness 04/11/2011  . Hyponatremia 04/11/2011  . UTI (lower urinary tract infection) 04/11/2011  . ARF (acute renal failure) ([place]) 04/11/2011  . Leucocytosis 04/11/2011  . Dehydration 04/11/2011  . Hypotension 04/11/2011  . BRONCHIECTASIS, + [facility] 03/25/2010  . DIZZINESS 02/02/2010  . MUSCULOSKELETAL PAIN 09/25/2007  . INSOMNIA 05/20/2007  . ACUTE NASOPHARYNGITIS 04/20/2007  . Bronchiectasis with acute exacerbation ([place]) 04/20/2007  . Esophageal reflux 04/20/2007  Past Surgical History:  Procedure Laterality Date  . ABDOMINAL HYSTERECTOMY    . ANKLE FRACTURE SURGERY Left   . APPENDECTOMY    . BASAL CELL CARCINOMA EXCISION     NOSE  . CATARACT EXTRACTION, BILATERAL     OUT PATIENT  . COLON SURGERY     colon resection for diverticulitis  . IR EPIDUROGRAPHY  05/07/2018  . LUMBAR LAMINECTOMY/DECOMPRESSION MICRODISCECTOMY N/A 02/15/2013   Procedure: LUMBAR LAMINECTOMY/DECOMPRESSION MICRODISCECTOMY LUMBAR FOUR-FIVE;  Surgeon: Otilio Connors, MD;  Location: [facility] NEURO ORS;  Service:  Neurosurgery;  Laterality: N/A;  . VARICOSE VEIN SURGERY       OB History   No obstetric history on file.      Home Medications    Prior to Admission medications   Medication Sig Start Date End Date Taking? Authorizing Provider  acetaminophen (TYLENOL) 325 MG tablet Take 2 tablets (650 mg total) by mouth every 6 (six) hours as needed for pain. Patient taking differently: Take 325 mg by mouth every 6 (six) hours as needed for pain.  08/05/12   Eugenie Filler, MD  acetaminophen-codeine (TYLENOL #3) 300-30 MG tablet 1 every 8 hours if needed for pain 06/01/18   Baird Lyons D, MD  ALPRAZolam Duanne Moron) 0.25 MG tablet Take 0.25 mg by mouth 2 (two) times daily as needed for sleep or anxiety. For anxiety.    [provider]  amLODipine (NORVASC) 2.5 MG tablet Take 2.5 mg by mouth daily.  12/19/15   [provider]  Artificial Tear Ointment (DRY EYES OP) Apply 1 drop to eye 2 (two) times daily as needed (for dry eyes).     [provider]  budesonide (PULMICORT) 0.25 MG/2ML nebulizer solution USE 1 VIAL  IN  NEBULIZER TWICE  DAILY - rinse mouth after treatment 07/13/18   Baird Lyons D, MD  chlorpheniramine-HYDROcodone [facility] PENNKINETIC ER) 10-8 MG/5ML SUER Take 5 mLs by mouth every 12 (twelve) hours as needed for cough. 10/05/18   Baird Lyons D, MD  cholecalciferol (VITAMIN D) 1000 UNITS tablet Take 2,000 Units by mouth as needed.     [provider]  Cinnamon 500 MG capsule Take 500 mg by mouth as needed.     [provider]  cycloSPORINE (RESTASIS) 0.05 % ophthalmic emulsion Place 1 drop into both eyes 2 (two) times daily.    [provider]  esomeprazole (NEXIUM) 40 MG capsule Take 40 mg by mouth every morning.     [provider]  Fluticasone-Umeclidin-Vilant (TRELEGY ELLIPTA) 100-62.5-25 MCG/INH AEPB Inhale 1 puff into the lungs daily. 02/23/18   Baird Lyons D, MD  formoterol (PERFOROMIST) 20 MCG/2ML nebulizer solution  Take 2 mLs (20 mcg total) by nebulization 2 (two) times daily. 10/05/18   Baird Lyons D, MD  ipratropium (ATROVENT) 0.02 % nebulizer solution USE 1 VIAL IN NEBULIZER 4 TIMES DAILY 07/13/18   Baird Lyons D, MD  levofloxacin (LEVAQUIN) 500 MG tablet Take 1 tablet (500 mg total) by mouth daily. Patient not taking: Reported on 12/24/2018 12/17/18   Lauraine Rinne, NP  losartan (COZAAR) 100 MG tablet Take 100 mg by mouth daily.  09/20/15   [provider]  meclizine (ANTIVERT) 25 MG tablet Take 25 mg by mouth every 6 (six) hours as needed for dizziness.  03/14/15   [provider]  metFORMIN (GLUCOPHAGE) 500 MG tablet Take 1,000 mg by mouth 2 (two) times daily.     [provider]  Omega-3 Fatty Acids (FISH OIL) 1000 MG  CAPS Take 1 capsule by mouth daily.    [provider]  ONE TOUCH ULTRA TEST test strip USE TO CHECK BLOOD SUGAR ONCE DAILY 90 01/04/16   [provider]  [facility] DELICA LANCETS [id number] MISC USE TO CHECK BLOOD SUGAR ONCE DAILY AS DIRECTED 01/04/16   [provider]  PROAIR HFA 108 (90 Base) MCG/ACT inhaler INHALE 2 PUFFS INTO THE LUNGS EVERY 6 HOURS AS NEEDED FOR WHEEZING OR SHORTNESS OF BREATH 06/18/18   Deneise Lever, MD  Respiratory Therapy Supplies (FLUTTER) DEVI Use as directed 11/03/15   Deneise Lever, MD  traMADol (ULTRAM) 50 MG tablet Take 0.5 tablets (25 mg total) by mouth every 6 (six) hours as needed for moderate pain. 05/08/18   Georgette Shell, MD  triamterene-hydrochlorothiazide (MAXZIDE-25) 37.5-25 MG tablet Take 1 tablet by mouth daily.    [provider]  vitamin E 400 UNIT capsule Take 400 Units by mouth daily.    [provider]    Family History Family History  Problem Relation Age of Onset  . Chronic bronchitis Father   . Other Sister        heart trouble  . Other Brother        heart trouble    Social History Social History   Tobacco Use  . Smoking status: Never Smoker  .  Smokeless tobacco: Never Used  Substance Use Topics  . Alcohol use: Yes    Comment: "Hardly Ever"   . Drug use: No     Allergies   Welchol [colesevelam hcl], Glucosamine, Tramadol, Celecoxib, and Statins   Review of Systems Review of Systems  Respiratory: Positive for cough (Chronic - no acute change.).   Neurological: Positive for weakness.  All other systems reviewed and are negative.    Physical Exam Updated Vital Signs BP (!) 119/53   Pulse 100   Temp 97.8 F (36.6 C)   Resp 16   Ht 5\' 8"  (1.727 m)   Wt 58 kg   SpO2 92%   BMI 19.44 kg/m   Physical Exam Vitals signs and nursing note reviewed.  Constitutional:      General: She is not in acute distress.    Appearance: She is well-developed.  HENT:     Head: Normocephalic and atraumatic.  Neck:     Musculoskeletal: Neck supple.  Cardiovascular:     Rate and Rhythm: Regular rhythm.     Heart sounds: Normal heart sounds. No murmur.  Pulmonary:     Effort: Pulmonary effort is normal. No respiratory distress.     Breath sounds: Normal breath sounds.     Comments: Frequent coughing fits during evaluation.  No wheezing. Abdominal:     General: There is no distension.     Palpations: Abdomen is soft.     Comments: No abdominal tenderness.  Skin:    General: Skin is warm and dry.  Neurological:     Mental Status: She is alert and oriented to person, place, and time.      ED Treatments / Results  Labs (all labs ordered are listed, but only abnormal results are displayed) Labs Reviewed  URINE CULTURE  CBC WITH DIFFERENTIAL/PLATELET  COMPREHENSIVE METABOLIC PANEL  URINALYSIS, ROUTINE W REFLEX MICROSCOPIC  TSH    EKG None  Radiology No results found.  Procedures Procedures (including critical care time)  Medications Ordered in ED Medications  sodium chloride 0.9 % bolus 1,000 mL (has no administration in time range)  Initial Impression / Assessment and Plan / ED Course  I have reviewed  the triage vital signs and the nursing notes.  Pertinent labs & imaging results that were available during my care of the patient were reviewed by me and considered in my medical decision making (see chart for details).  Clinical Course as of Jan 07 1712  Thu Jan 06, 4014  [id number] 85 year old female with history of chronic lung issues here with continued cough and generalized weakness.  She saw her PCP and has had a low sodium.  Here she is complaining of just feeling so weak that she can even walk.  She is got a dry cough here.  Labs are significant for an elevated white count.  Stable anemia.  Low sodium of 120.  New AKI. Getting some IV fluids. Will need admission   [MB]    Clinical Course User Index [MB] Hayden Rasmussen, MD      BARB PATRIDGE is a 85 y.o. female who presents to ED from PCP for concerns of hyponatremia and dehydration.  On exam, patient with frequent coughing fits in the room and complaining of generalized weakness.  Labs reviewed showing leukocytosis of 15.3, hyponatremia with sodium of 120 as well as new AKI with creatinine of 1.55.  Given liter of fluid.  Chest x-ray obtained showing left lower lobe pneumonia.  Given leukocytosis, blood cultures obtained and she was started on ABX.  She had a negative COVID test 2 days ago.  Discussed with hospitalist who will admit.  Patient seen by and discussed with Dr. Melina Copa who agrees with treatment plan.    Final Clinical Impressions(s) / ED Diagnoses   Final diagnoses:  Weakness    ED Discharge Orders    None       Ward, Ozella Almond, PA-C 01/07/19 1716    Hayden Rasmussen, MD 01/08/19 [phone]

## 2019-01-07 NOTE — ED Notes (Signed)
[facility] notified (Kim) - patient ready for transport 

## 2019-01-07 NOTE — Progress Notes (Signed)
RT notified PA about the 2 inhalers and 2 breathing treatment medications that we do not have here at [facility]. RT instructed to give patient Duoneb for right now. Other medications will be started when she gets to [facility].

## 2019-01-07 NOTE — ED Triage Notes (Signed)
Patient states she went to her PCP today because she has felt tired. Patient was told to come to the ED because her Na level was low and needed IV fluids.

## 2019-01-07 NOTE — ED Notes (Signed)
Received call from [facility] Pt coming from PCP with c/o fatigue. Per office, pt is + for orthostatic changes.

## 2019-01-07 NOTE — ED Notes (Signed)
Pt aware we need urine specimen, unable to go at this time

## 2019-01-07 NOTE — Progress Notes (Signed)
Patient arrived to 5 E, A X O X 4. Cough, no pain only with coughing. No questions or concerns.  Hard of hearing. Patient oriented to room and staff. Will continue to monitor.

## 2019-01-07 NOTE — ED Triage Notes (Signed)
Pt c/o generalized weakness x 33month , sent here by PMD for orthostatic hypotension and low NA

## 2019-01-07 NOTE — ED Notes (Signed)
ED Provider at bedside. Dr. Butler. 

## 2019-01-07 NOTE — ED Notes (Signed)
[facility] at bedside 

## 2019-01-07 NOTE — H&P (Addendum)
TRH H&P    Patient Demographics:    Katie Alvarado, is a 85 y.o. female  MRN: [id number]  DOB - 1925/10/31  Admit Date - 01/07/2019  Referring MD/NP/PA:  Roselyn Reef Ward  Outpatient Primary MD for the patient is Hulan Fess, MD  Patient coming from: home-> [facility]  Chief complaint- cough, dyspnea   HPI:    Morning Katie Alvarado  is a 85 y.o. female,  w  hx of lip cancer s/p xrt, hypertension, dm2, bronchiectasis,  Copd, not on home o2,c/o dyspnea and increase in cough over the past 2 weeks. Cough is nonproductive. Pt has left sided chest pain with cough.  Pt denies fever, chills, palp, n/v, abd pain, diarrhea, brbpr, black stool, dysuria, hematuria.   In ED,  T 97.8, P 100 R 16, Bp 119/53 pox 92%  Wt 58kg  CXR IMPRESSION: 1. Left lower lobe pneumonia. 2. Chronic changes both lungs, due to bronchiectasis bronchial wall thickening and mucous plugging, as noted on the CT dated 09/24/2016.  Wbc 11-20,    Wbc 15.3, Hgb 10.7, Plt 403 Na 120, K 4.3,  Bun 28, Creatinine 1.55 Alb 3.1 Ast 16, Alt 14 Tsh 2.852 Blood culture x2 pending  Pt will be admitted for CAP and Copd exacerbation.     Review of systems:    In addition to the HPI above,  No Fever-chills, No Headache, No changes with Vision or hearing, No problems swallowing food or Liquids,   No Abdominal pain, No Nausea or Vomiting, bowel movements are regular, No Blood in stool or Urine, No dysuria, No new skin rashes or bruises, No new joints pains-aches,  No new weakness, tingling, numbness in any extremity, No recent weight gain or loss, No polyuria, polydypsia or polyphagia, No significant Mental Stressors.  All other systems reviewed and are negative.    Past History of the following :    Past Medical History:  Diagnosis Date  . Acute nasopharyngitis (common cold)   . Arthritis   . Bronchiectasis with acute exacerbation ([place])    . Cancer ([place])    skin cancer  . COPD (chronic obstructive pulmonary disease) ([place])    sees dr. Annamaria Boots   . Cough   . Diabetes mellitus    fasting 130-150  . Dizziness   . Esophageal reflux   . History of radiation therapy 10/24/17- 12/05/17   Lower lip, 4.4 Gy X 10 fractions given twice weekly for a total dose of 44 Gy.  Marland Kitchen Hypertension   . Insomnia, unspecified   . Other symptoms involving nervous and musculoskeletal systems(781.99)   . Pneumonia    hx of  . Shortness of breath    exertion      Past Surgical History:  Procedure Laterality Date  . ABDOMINAL HYSTERECTOMY    . ANKLE FRACTURE SURGERY Left   . APPENDECTOMY    . BASAL CELL CARCINOMA EXCISION     NOSE  . CATARACT EXTRACTION, BILATERAL     OUT PATIENT  . COLON SURGERY     colon resection for  diverticulitis  . IR EPIDUROGRAPHY  05/07/2018  . LUMBAR LAMINECTOMY/DECOMPRESSION MICRODISCECTOMY N/A 02/15/2013   Procedure: LUMBAR LAMINECTOMY/DECOMPRESSION MICRODISCECTOMY LUMBAR FOUR-FIVE;  Surgeon: Otilio Connors, MD;  Location: [facility];  Service: Neurosurgery;  Laterality: N/A;  . VARICOSE VEIN SURGERY        Social History:      Social History   Tobacco Use  . Smoking status: Never Smoker  . Smokeless tobacco: Never Used  Substance Use Topics  . Alcohol use: Yes    Comment: "Hardly Ever"        Family History :     Family History  Problem Relation Age of Onset  . Chronic bronchitis Father   . Other Sister        heart trouble  . Other Brother        heart trouble       Home Medications:   Prior to Admission medications   Medication Sig Start Date End Date Taking? Authorizing Provider  acetaminophen (TYLENOL) 325 MG tablet Take 2 tablets (650 mg total) by mouth every 6 (six) hours as needed for pain. Patient taking differently: Take 325 mg by mouth every 6 (six) hours as needed for pain.  08/05/12   Eugenie Filler, MD  acetaminophen-codeine (TYLENOL #3) 300-30 MG tablet 1 every 8 hours  if needed for pain 06/01/18   Baird Lyons D, MD  ALPRAZolam Duanne Moron) 0.25 MG tablet Take 0.25 mg by mouth 2 (two) times daily as needed for sleep or anxiety. For anxiety.    [provider]  amLODipine (NORVASC) 2.5 MG tablet Take 2.5 mg by mouth daily.  12/19/15   [provider]  Artificial Tear Ointment (DRY EYES OP) Apply 1 drop to eye 2 (two) times daily as needed (for dry eyes).     [provider]  budesonide (PULMICORT) 0.25 MG/2ML nebulizer solution USE 1 VIAL  IN  NEBULIZER TWICE  DAILY - rinse mouth after treatment 07/13/18   Baird Lyons D, MD  chlorpheniramine-HYDROcodone [facility] PENNKINETIC ER) 10-8 MG/5ML SUER Take 5 mLs by mouth every 12 (twelve) hours as needed for cough. 10/05/18   Baird Lyons D, MD  cholecalciferol (VITAMIN D) 1000 UNITS tablet Take 2,000 Units by mouth as needed.     [provider]  Cinnamon 500 MG capsule Take 500 mg by mouth as needed.     [provider]  cycloSPORINE (RESTASIS) 0.05 % ophthalmic emulsion Place 1 drop into both eyes 2 (two) times daily.    [provider]  esomeprazole (NEXIUM) 40 MG capsule Take 40 mg by mouth every morning.     [provider]  Fluticasone-Umeclidin-Vilant (TRELEGY ELLIPTA) 100-62.5-25 MCG/INH AEPB Inhale 1 puff into the lungs daily. 02/23/18   Baird Lyons D, MD  formoterol (PERFOROMIST) 20 MCG/2ML nebulizer solution Take 2 mLs (20 mcg total) by nebulization 2 (two) times daily. 10/05/18   Baird Lyons D, MD  ipratropium (ATROVENT) 0.02 % nebulizer solution USE 1 VIAL IN NEBULIZER 4 TIMES DAILY 07/13/18   Baird Lyons D, MD  levofloxacin (LEVAQUIN) 500 MG tablet Take 1 tablet (500 mg total) by mouth daily. Patient not taking: Reported on 12/24/2018 12/17/18   Lauraine Rinne, NP  losartan (COZAAR) 100 MG tablet Take 100 mg by mouth daily.  09/20/15   [provider]  meclizine (ANTIVERT) 25 MG tablet Take 25 mg by mouth every 6 (six) hours as needed  for dizziness.  03/14/15   [provider]  metFORMIN (  GLUCOPHAGE) 500 MG tablet Take 1,000 mg by mouth 2 (two) times daily.     [provider]  Omega-3 Fatty Acids (FISH OIL) 1000 MG CAPS Take 1 capsule by mouth daily.    [provider]  ONE TOUCH ULTRA TEST test strip USE TO CHECK BLOOD SUGAR ONCE DAILY 90 01/04/16   [provider]  [facility] DELICA LANCETS 99991111 MISC USE TO CHECK BLOOD SUGAR ONCE DAILY AS DIRECTED 01/04/16   [provider]  PROAIR HFA 108 (90 Base) MCG/ACT inhaler INHALE 2 PUFFS INTO THE LUNGS EVERY 6 HOURS AS NEEDED FOR WHEEZING OR SHORTNESS OF BREATH 06/18/18   Deneise Lever, MD  Respiratory Therapy Supplies (FLUTTER) DEVI Use as directed 11/03/15   Deneise Lever, MD  traMADol (ULTRAM) 50 MG tablet Take 0.5 tablets (25 mg total) by mouth every 6 (six) hours as needed for moderate pain. 05/08/18   Georgette Shell, MD  triamterene-hydrochlorothiazide (MAXZIDE-25) 37.5-25 MG tablet Take 1 tablet by mouth daily.    [provider]  vitamin E 400 UNIT capsule Take 400 Units by mouth daily.    [provider]     Allergies:     Allergies  Allergen Reactions  . Welchol [Colesevelam Hcl] Hives  . Glucosamine Other (See Comments)    Unknown  . Tramadol Nausea Only  . Celecoxib Hives, Itching and Rash  . Statins Rash     Physical Exam:   Vitals  Blood pressure 116/60, pulse 84, temperature 97.9 F (36.6 C), temperature source Oral, resp. rate (!) 22, height 5\' 8"  (1.727 m), weight 58 kg, SpO2 98 %.  1.  General: axoxo3  2. Psychiatric: euthymic  3. Neurologic: Cn 2-12 intact, reflexes 2+ symmetric, diffuse with no clonus, motor 5/5 in all 4 ext  4. HEENMT:  Anicteric, pupils 1.78mm symmetric, direct, consensual, near intact Neck: no jvd  5. Respiratory : + crackles in the left lower/ mid lung, + exp wheezing, tight  6. Cardiovascular : rrr s1, s2,   7. Gastrointestinal:  Abd: soft, nt,  nd, +bs  8. Skin:  Ext: no c/c/e, no rash  9.Musculoskeletal:  Good ROM, left sided chest pain with palpation    Data Review:    CBC Recent Labs  Lab 01/07/19 1409  WBC 15.3*  HGB 10.7*  HCT 33.1*  PLT 403*  MCV 86.6  MCH 28.0  MCHC 32.3  RDW 13.2  LYMPHSABS 1.4  MONOABS 1.4*  EOSABS 0.2  BASOSABS 0.1   ------------------------------------------------------------------------------------------------------------------  Results for orders placed or performed during the hospital encounter of 01/07/19 (from the past 48 hour(s))  Urinalysis, Routine w reflex microscopic     Status: Abnormal   Collection Time: 01/07/19  2:00 PM  Result Value Ref Range   Color, Urine YELLOW YELLOW   APPearance CLEAR CLEAR   Specific Gravity, Urine 1.010 1.005 - 1.030   pH 5.5 5.0 - 8.0   Glucose, UA NEGATIVE NEGATIVE mg/dL   Hgb urine dipstick NEGATIVE NEGATIVE   Bilirubin Urine NEGATIVE NEGATIVE   Ketones, ur NEGATIVE NEGATIVE mg/dL   Protein, ur NEGATIVE NEGATIVE mg/dL   Nitrite NEGATIVE NEGATIVE   Leukocytes,Ua SMALL (A) NEGATIVE    Comment: Performed at [facility], [address] [place], [place] [address]  Urinalysis, Microscopic (reflex)     Status: Abnormal   Collection Time: 01/07/19  2:00 PM  Result Value Ref Range   RBC / HPF 0-5 0 - 5 RBC/hpf   WBC,  UA 11-20 0 - 5 WBC/hpf   Bacteria, UA MANY (A) NONE SEEN   Squamous Epithelial / LPF 6-10 0 - 5   Mucus PRESENT    Hyaline Casts, UA PRESENT     Comment: Performed at [facility], [address] [place], [place] [address]  CBC with Differential     Status: Abnormal   Collection Time: 01/07/19  2:09 PM  Result Value Ref Range   WBC 15.3 (H) 4.0 - 10.5 K/uL   RBC 3.82 (L) 3.87 - 5.11 MIL/uL   Hemoglobin 10.7 (L) 12.0 - 15.0 g/dL   HCT 33.1 (L) 36.0 - 46.0 %   MCV 86.6 80.0 - 100.0 fL   MCH 28.0 26.0 - 34.0 pg   MCHC 32.3 30.0 - 36.0 g/dL   RDW 13.2 11.5 - 15.5 %   Platelets 403 (H) 150 - 400  K/uL   nRBC 0.0 0.0 - 0.2 %   Neutrophils Relative % 79 %   Neutro Abs 12.1 (H) 1.7 - 7.7 K/uL   Lymphocytes Relative 9 %   Lymphs Abs 1.4 0.7 - 4.0 K/uL   Monocytes Relative 9 %   Monocytes Absolute 1.4 (H) 0.1 - 1.0 K/uL   Eosinophils Relative 1 %   Eosinophils Absolute 0.2 0.0 - 0.5 K/uL   Basophils Relative 1 %   Basophils Absolute 0.1 0.0 - 0.1 K/uL   Immature Granulocytes 1 %   Abs Immature Granulocytes 0.15 (H) 0.00 - 0.07 K/uL    Comment: Performed at [facility], [address] [place], [place] [address]  Comprehensive metabolic panel     Status: Abnormal   Collection Time: 01/07/19  2:09 PM  Result Value Ref Range   Sodium 120 (L) 135 - 145 mmol/L   Potassium 4.3 3.5 - 5.1 mmol/L   Chloride 86 (L) 98 - 111 mmol/L   CO2 19 (L) 22 - 32 mmol/L   Glucose, Bld 172 (H) 70 - 99 mg/dL   BUN 28 (H) 8 - 23 mg/dL   Creatinine, Ser 1.55 (H) 0.44 - 1.00 mg/dL   Calcium 8.3 (L) 8.9 - 10.3 mg/dL   Total Protein 7.0 6.5 - 8.1 g/dL   Albumin 3.1 (L) 3.5 - 5.0 g/dL   AST 16 15 - 41 U/L   ALT 14 0 - 44 U/L   Alkaline Phosphatase 82 38 - 126 U/L   Total Bilirubin 0.6 0.3 - 1.2 mg/dL   GFR calc non Af Amer 29 (L) >60 mL/min   GFR calc Af Amer 34 (L) >60 mL/min   Anion gap 15 5 - 15    Comment: Performed at [facility], [address] [place], [place] [address]  TSH     Status: None   Collection Time: 01/07/19  2:09 PM  Result Value Ref Range   TSH 2.852 0.350 - 4.500 uIU/mL    Comment: Performed by a 3rd Generation assay with a functional sensitivity of <=0.01 uIU/mL. Performed at [facility], [place] [address] [place], [place] [address]   Lactic acid, plasma     Status: None   Collection Time: 01/07/19  7:30 PM  Result Value Ref Range   Lactic Acid, Venous 1.6 0.5 - 1.9 mmol/L    Comment: Performed at [facility], [address] [place], [place] [address]  Lactic acid, plasma     Status: None   Collection Time: 01/07/19  8:30 PM  Result Value Ref Range   Lactic Acid, Venous 1.4 0.5 - 1.9 mmol/L    Comment: Performed at [facility], [address] [place], [place] [address]    Chemistries  Recent Labs  Lab 01/07/19 1409  NA 120*  K 4.3  CL 86*  CO2 19*  GLUCOSE 172*  BUN 28*  CREATININE 1.55*  CALCIUM 8.3*  AST 16  ALT 14  ALKPHOS 82  BILITOT 0.6   ------------------------------------------------------------------------------------------------------------------  ------------------------------------------------------------------------------------------------------------------ GFR: Estimated Creatinine Clearance: 21.6 mL/min (A) (by C-G formula based on SCr of 1.55 mg/dL (H)). Liver Function Tests: Recent Labs  Lab 01/07/19 1409  AST 16  ALT 14  ALKPHOS 82  BILITOT 0.6  PROT 7.0  ALBUMIN 3.1*   No results for input(s): LIPASE, AMYLASE in the last 168 hours. No results for input(s): AMMONIA in the last 168 hours. Coagulation Profile: No results for input(s): INR, PROTIME in the last 168 hours. Cardiac Enzymes: No results for input(s): CKTOTAL, CKMB, CKMBINDEX, TROPONINI in the last 168 hours. BNP (last 3 results) No results for input(s): PROBNP in the last 8760 hours. HbA1C: No results for input(s): HGBA1C in the last 72 hours. CBG: No results for input(s): GLUCAP in the last 168 hours. Lipid Profile: No results for input(s): CHOL, HDL, LDLCALC, TRIG, CHOLHDL, LDLDIRECT in the last 72 hours. Thyroid Function Tests: Recent Labs    01/07/19 1409  TSH 2.852   Anemia Panel: No results for input(s): VITAMINB12, FOLATE, FERRITIN, TIBC, IRON, RETICCTPCT in the last 72 hours.  --------------------------------------------------------------------------------------------------------------- Urine analysis:    Component Value Date/Time   COLORURINE YELLOW 01/07/2019 1400   APPEARANCEUR CLEAR 01/07/2019 1400   LABSPEC 1.010 01/07/2019 1400   PHURINE 5.5 01/07/2019 1400    GLUCOSEU NEGATIVE 01/07/2019 1400   HGBUR NEGATIVE 01/07/2019 1400   BILIRUBINUR NEGATIVE 01/07/2019 1400   KETONESUR NEGATIVE 01/07/2019 1400   PROTEINUR NEGATIVE 01/07/2019 1400   UROBILINOGEN 0.2 12/19/2018 1155   NITRITE NEGATIVE 01/07/2019 1400   LEUKOCYTESUR SMALL (A) 01/07/2019 1400      Imaging Results:    Dg Chest 2 View  Result Date: 01/07/2019 CLINICAL DATA:  Generalized weakness for 1 month. Orthostatic hypotension. EXAM: CHEST - 2 VIEW COMPARISON:  12/24/2018 and older exams. FINDINGS: Cardiac silhouette is normal in size. No mediastinal or hilar masses. No evidence of adenopathy. Left lower lobe opacity obscures the posterior left hemidiaphragm, new since the prior chest radiograph, consistent pneumonia. Linear and reticular type opacities noted bilaterally most evident in the left mid to lower lung and in the right upper lobe, stable compared to the prior studies. No evidence of pulmonary edema. No pleural effusion or pneumothorax. Skeletal structures are intact. IMPRESSION: 1. Left lower lobe pneumonia. 2. Chronic changes both lungs, due to bronchiectasis bronchial wall thickening and mucous plugging, as noted on the CT dated 09/24/2016. Electronically Signed   By: Lajean Manes M.D.   On: 01/07/2019 14:36       Assessment & Plan:    Principal Problem:   Hyponatremia Active Problems:   DM type 2 (diabetes mellitus, type 2) ([place])   Essential hypertension   Community acquired pneumonia of left lower lobe of lung (HCC)  CAP Blood culture x2 Urine strep antigen Urine legionella antigen Rocephin 1gm iv qday, Zithromax 500mg  iv qday  Hyponatremia Check serum osm, cortisol, tish Check urine osm, urine sodium Hydrate with ns at 18mL per hour Check cmp in am  Copd exacerbation Solumedrol 60mg  iv q8h abx as above  Trelegy 1puff qday DC Atrovent (since using Trelegy) Albuterol neb 1neb q6h prn Xopenex HFA 2puff tid  Chest pain atypical Tele Trop I x2 Add D dimer  to labs, if positive then care order instruction for CTA chest r/o Pulmonary embolism Consider cardiac echo/ cardiology consult  Hypertension Cont Amlodipine 2.5mg  po qday Cont Losartan 100mg  po qday  Dm2 Stop metformin fsbs ac and qhs, ISS  Gerd Cont PPI  DVT Prophylaxis-   Lovenox - SCDs   AM Labs Ordered, also please review Full Orders  Family Communication: Admission, patients condition and plan of care including tests being ordered have been discussed with the patient  who indicate understanding and agree with the plan and Code Status.  Code Status:  FULL CODE per patient  Admission status: Inpatient: Based on patients clinical presentation and evaluation of above clinical data, I have made determination that patient meets Inpatient criteria at this time.   Pt's sodium is critically low, and will need gentle iv hydration.  Pt also has CAP and will require iv abx.  Pt has high risk of clinical deterioration, pt will require > 2 nites stay.    Time spent in minutes : 70 minutes   Jani Gravel M.D on 01/07/2019 at 11:22 PM

## 2019-01-08 ENCOUNTER — Inpatient Hospital Stay (HOSPITAL_COMMUNITY): Payer: Medicare Other

## 2019-01-08 DIAGNOSIS — R531 Weakness: Secondary | ICD-10-CM

## 2019-01-08 DIAGNOSIS — N179 Acute kidney failure, unspecified: Secondary | ICD-10-CM

## 2019-01-08 LAB — CBC
HCT: 26.9 % — ABNORMAL LOW (ref 36.0–46.0)
Hemoglobin: 8.5 g/dL — ABNORMAL LOW (ref 12.0–15.0)
MCH: 28.1 pg (ref 26.0–34.0)
MCHC: 31.6 g/dL (ref 30.0–36.0)
MCV: 89.1 fL (ref 80.0–100.0)
Platelets: 318 10*3/uL (ref 150–400)
RBC: 3.02 MIL/uL — ABNORMAL LOW (ref 3.87–5.11)
RDW: 13.3 % (ref 11.5–15.5)
WBC: 9 10*3/uL (ref 4.0–10.5)
nRBC: 0 % (ref 0.0–0.2)

## 2019-01-08 LAB — TROPONIN I (HIGH SENSITIVITY)
Troponin I (High Sensitivity): 5 ng/L (ref <18)
Troponin I (High Sensitivity): 5 ng/L (ref <18)

## 2019-01-08 LAB — BASIC METABOLIC PANEL
Anion gap: 11 (ref 5–15)
BUN: 23 mg/dL (ref 8–23)
CO2: 17 mmol/L — ABNORMAL LOW (ref 22–32)
Calcium: 7.4 mg/dL — ABNORMAL LOW (ref 8.9–10.3)
Chloride: 103 mmol/L (ref 98–111)
Creatinine, Ser: 1.03 mg/dL — ABNORMAL HIGH (ref 0.44–1.00)
GFR calc Af Amer: 55 mL/min — ABNORMAL LOW (ref >60)
GFR calc non Af Amer: 47 mL/min — ABNORMAL LOW (ref >60)
Glucose, Bld: 164 mg/dL — ABNORMAL HIGH (ref 70–99)
Potassium: 4.4 mmol/L (ref 3.5–5.1)
Sodium: 131 mmol/L — ABNORMAL LOW (ref 135–145)

## 2019-01-08 LAB — D-DIMER, QUANTITATIVE: D-Dimer, Quant: 1.19 ug/mL-FEU — ABNORMAL HIGH (ref 0.00–0.50)

## 2019-01-08 LAB — HEPARIN LEVEL (UNFRACTIONATED): Heparin Unfractionated: <0.1 IU/mL — ABNORMAL LOW (ref 0.30–0.70)

## 2019-01-08 LAB — SARS CORONAVIRUS 2 (TAT 6-24 HRS): SARS Coronavirus 2: NEGATIVE

## 2019-01-08 LAB — STREP PNEUMONIAE URINARY ANTIGEN: Strep Pneumo Urinary Antigen: NEGATIVE

## 2019-01-08 LAB — GLUCOSE, CAPILLARY
Glucose-Capillary: 176 mg/dL — ABNORMAL HIGH (ref 70–99)
Glucose-Capillary: 200 mg/dL — ABNORMAL HIGH (ref 70–99)
Glucose-Capillary: 221 mg/dL — ABNORMAL HIGH (ref 70–99)
Glucose-Capillary: 263 mg/dL — ABNORMAL HIGH (ref 70–99)
Glucose-Capillary: 372 mg/dL — ABNORMAL HIGH (ref 70–99)

## 2019-01-08 LAB — HIV ANTIBODY (ROUTINE TESTING W REFLEX): HIV Screen 4th Generation wRfx: NONREACTIVE

## 2019-01-08 LAB — PROTIME-INR
INR: 1.4 — ABNORMAL HIGH (ref 0.8–1.2)
Prothrombin Time: 16.8 seconds — ABNORMAL HIGH (ref 11.4–15.2)

## 2019-01-08 LAB — APTT: aPTT: 41 seconds — ABNORMAL HIGH (ref 24–36)

## 2019-01-08 MED ORDER — HEPARIN (PORCINE) 25000 UT/250ML-% IV SOLN
1100.0000 [IU]/h | INTRAVENOUS | Status: DC
  Administered 2019-01-08: 05:00:00 900 [IU]/h via INTRAVENOUS
  Filled 2019-01-08: qty 250

## 2019-01-08 MED ORDER — TECHNETIUM TO 99M ALBUMIN AGGREGATED
1.6000 | Freq: Once | INTRAVENOUS | Status: AC | PRN
  Administered 2019-01-08: 1.6 via INTRAVENOUS

## 2019-01-08 MED ORDER — AMLODIPINE BESYLATE 5 MG PO TABS
2.5000 mg | ORAL_TABLET | Freq: Every day | ORAL | Status: DC
  Administered 2019-01-08 – 2019-01-10 (×3): 2.5 mg via ORAL
  Filled 2019-01-08 (×3): qty 1

## 2019-01-08 MED ORDER — PREDNISONE 20 MG PO TABS
40.0000 mg | ORAL_TABLET | Freq: Every day | ORAL | Status: DC
  Administered 2019-01-08 – 2019-01-09 (×2): 40 mg via ORAL
  Filled 2019-01-08 (×2): qty 2

## 2019-01-08 MED ORDER — GUAIFENESIN-DM 100-10 MG/5ML PO SYRP
5.0000 mL | ORAL_SOLUTION | ORAL | Status: DC | PRN
  Administered 2019-01-08 – 2019-01-09 (×3): 5 mL via ORAL
  Filled 2019-01-08 (×3): qty 10

## 2019-01-08 MED ORDER — ALPRAZOLAM 0.25 MG PO TABS
0.2500 mg | ORAL_TABLET | Freq: Two times a day (BID) | ORAL | Status: DC | PRN
  Administered 2019-01-08 – 2019-01-09 (×3): 0.25 mg via ORAL
  Filled 2019-01-08 (×3): qty 1

## 2019-01-08 MED ORDER — AZITHROMYCIN 250 MG PO TABS
500.0000 mg | ORAL_TABLET | ORAL | Status: DC
  Administered 2019-01-08 – 2019-01-09 (×2): 500 mg via ORAL
  Filled 2019-01-08 (×2): qty 2

## 2019-01-08 MED ORDER — IOHEXOL 350 MG/ML SOLN
75.0000 mL | Freq: Once | INTRAVENOUS | Status: AC | PRN
  Administered 2019-01-08: 75 mL via INTRAVENOUS

## 2019-01-08 MED ORDER — LEVALBUTEROL TARTRATE 45 MCG/ACT IN AERO
2.0000 | INHALATION_SPRAY | Freq: Three times a day (TID) | RESPIRATORY_TRACT | Status: DC
  Filled 2019-01-08: qty 15

## 2019-01-08 MED ORDER — TECHNETIUM TC 99M DIETHYLENETRIAME-PENTAACETIC ACID
29.0000 | Freq: Once | INTRAVENOUS | Status: AC | PRN
  Administered 2019-01-08: 11:00:00 29 via INTRAVENOUS

## 2019-01-08 MED ORDER — MECLIZINE HCL 25 MG PO TABS: 25.0000 mg | ORAL_TABLET | Freq: Four times a day (QID) | ORAL | Status: DC | PRN

## 2019-01-08 MED ORDER — ENOXAPARIN SODIUM 40 MG/0.4ML ~~LOC~~ SOLN
40.0000 mg | SUBCUTANEOUS | Status: DC
  Administered 2019-01-09 – 2019-01-10 (×2): 40 mg via SUBCUTANEOUS
  Filled 2019-01-08 (×2): qty 0.4

## 2019-01-08 MED ORDER — LEVALBUTEROL TARTRATE 45 MCG/ACT IN AERO
2.0000 | INHALATION_SPRAY | Freq: Three times a day (TID) | RESPIRATORY_TRACT | Status: DC
  Administered 2019-01-08 – 2019-01-09 (×4): 2 via RESPIRATORY_TRACT
  Filled 2019-01-08: qty 15

## 2019-01-08 MED ORDER — LOSARTAN POTASSIUM 50 MG PO TABS: 100.0000 mg | ORAL_TABLET | Freq: Every day | ORAL | Status: DC

## 2019-01-08 MED ORDER — METHYLPREDNISOLONE SODIUM SUCC 125 MG IJ SOLR
60.0000 mg | Freq: Three times a day (TID) | INTRAMUSCULAR | Status: DC
  Administered 2019-01-08: 60 mg via INTRAVENOUS
  Filled 2019-01-08 (×2): qty 2

## 2019-01-08 MED ORDER — HEPARIN BOLUS VIA INFUSION
2000.0000 [IU] | Freq: Once | INTRAVENOUS | Status: AC
  Administered 2019-01-08: 05:00:00 2000 [IU] via INTRAVENOUS
  Filled 2019-01-08: qty 2000

## 2019-01-08 MED ORDER — SODIUM CHLORIDE (PF) 0.9 % IJ SOLN
INTRAMUSCULAR | Status: AC
  Filled 2019-01-08: qty 50

## 2019-01-08 MED ORDER — HEPARIN BOLUS VIA INFUSION
2000.0000 [IU] | Freq: Once | INTRAVENOUS | Status: AC
  Administered 2019-01-08: 2000 [IU] via INTRAVENOUS
  Filled 2019-01-08: qty 2000

## 2019-01-08 MED ORDER — TRAMADOL HCL 50 MG PO TABS
25.0000 mg | ORAL_TABLET | Freq: Four times a day (QID) | ORAL | Status: DC | PRN
  Administered 2019-01-09 – 2019-01-10 (×2): 25 mg via ORAL
  Filled 2019-01-08 (×2): qty 1

## 2019-01-08 MED ORDER — HYDROCOD POLST-CPM POLST ER 10-8 MG/5ML PO SUER
5.0000 mL | Freq: Two times a day (BID) | ORAL | Status: DC | PRN
  Administered 2019-01-08: 5 mL via ORAL
  Filled 2019-01-08: qty 5

## 2019-01-08 NOTE — Progress Notes (Signed)
Assumed care of pt from off going RN. Condition stable. Cont with current plan of care

## 2019-01-08 NOTE — Progress Notes (Signed)
PROGRESS NOTE                                                                                                                                                                                                             Patient Demographics:    Katie Alvarado, is a 85 y.o. female, DOB - 01-Feb-1926, [id number]  Admit date - 01/07/2019   Admitting Physician Flora Lipps, MD  Outpatient Primary MD for the patient is Hulan Fess, MD  LOS - 1    Chief Complaint  Patient presents with   Weakness       Brief Narrative  85 year old female with hypertension, type 2 diabetes mellitus, history of COPD/bronchiectasis not on home O2, history of lip cancer status post radiation presented with nonproductive cough and left-sided pleuritic chest pain.  Denied any fevers, chills, nausea, vomiting, abdominal pain, bowel or urinary symptoms. In the ED she was tachycardic in the low 100s and O2 sat of 92% on room air. Blood work showed significant hyponatremia with sodium of 120, AKI with creatinine 1.55, WBC of 15 K and hemoglobin of 10.7.  Chest x-ray showed left lower lobe pneumonia.  Blood cultures ordered and patient placed on empiric Rocephin and azithromycin along with IV Solu-Medrol for lobar pneumonia and COPD exacerbation.  D-dimer was elevated so she was ordered for a VQ scan to rule out PE.   Subjective:   Patient still having nonproductive cough.  Reports left-sided chest discomfort to be better.  Assessment  & Plan :    Principal Problem: Left lobar pneumonia ([place]) Continue empiric Rocephin and azithromycin.  Follow blood cultures.  Added antitussives.  Maintaining O2 sat on room air. Patient had shortness of breath with elevated d-dimer and sinus tachycardia.  VQ scan ordered on admission (clinically I suspect she has low probability for PE.  Currently on heparin drip.  COPD/bronchiectasis with mild  exacerbation Continue nebs.  Has mild symptoms, switch to oral prednisone.  Active problems   Hyponatremia Possibly combination of dehydration and SIADH due to pneumonia.  Improved with IV fluids (131 today).  Continue hydration.  Acute kidney injury Suspect prerenal.  Avoid nephrotoxins and monitor with gentle hydration.  Hold losartan.   Type 2 diabetes mellitus, controlled Monitor on sliding scale coverage  Essential hypertension Stable.  Holding losartan due to AKI.   Code Status :  Full code  Family Communication  : none  Disposition Plan  : home in 24-48 hrs if improved  Barriers For Discharge : active symptoms Consults  :  none  Procedures  : vq scan  DVT Prophylaxis  :  IV heparin  Lab Results  Component Value Date   PLT 318 01/08/2019    Antibiotics  :   Anti-infectives (From admission, onward)   Start     Dose/Rate Route Frequency Ordered Stop   01/08/19 1600  azithromycin (ZITHROMAX) 500 mg in sodium chloride 0.9 % 250 mL IVPB     500 mg 250 mL/hr over 60 Minutes Intravenous Every 24 hours 01/07/19 2242     01/08/19 1500  cefTRIAXone (ROCEPHIN) 1 g in sodium chloride 0.9 % 100 mL IVPB     1 g 200 mL/hr over 30 Minutes Intravenous Every 24 hours 01/07/19 2242     01/07/19 1500  cefTRIAXone (ROCEPHIN) 1 g in sodium chloride 0.9 % 100 mL IVPB     1 g 200 mL/hr over 30 Minutes Intravenous  Once 01/07/19 1450 01/07/19 1559   01/07/19 1500  azithromycin (ZITHROMAX) tablet 500 mg     500 mg Oral  Once 01/07/19 1450 01/07/19 1504        Objective:   Vitals:   01/07/19 2240 01/08/19 0433 01/08/19 0621 01/08/19 0742  BP: 116/60 (!) 119/53    Pulse: 84 81    Resp: (!) 22 16    Temp: 97.9 F (36.6 C) 98.4 F (36.9 C)    TempSrc: Oral Oral    SpO2: 98%  95% 93%  Weight:      Height:        Wt Readings from Last 3 Encounters:  01/07/19 58 kg  12/24/18 61.8 kg  10/05/18 66.3 kg     Intake/Output Summary (Last 24 hours) at 01/08/2019 1136 Last  data filed at 01/08/2019 0601 Gross per 24 hour  Intake 2182.13 ml  Output --  Net 2182.13 ml     Physical Exam  Gen: not in distress HEENT:  moist mucosa, supple neck Chest: coarse breath sounds b/l with scattered rhonchi CVS: N S1&S2, no murmurs, GI: soft, NT, ND,  Musculoskeletal: warm, no edema     Data Review:    CBC Recent Labs  Lab 01/07/19 1409 01/08/19 0422  WBC 15.3* 9.0  HGB 10.7* 8.5*  HCT 33.1* 26.9*  PLT 403* 318  MCV 86.6 89.1  MCH 28.0 28.1  MCHC 32.3 31.6  RDW 13.2 13.3  LYMPHSABS 1.4  --   MONOABS 1.4*  --   EOSABS 0.2  --   BASOSABS 0.1  --     Chemistries  Recent Labs  Lab 01/07/19 1409 01/08/19 0436  NA 120* 131*  K 4.3 4.4  CL 86* 103  CO2 19* 17*  GLUCOSE 172* 164*  BUN 28* 23  CREATININE 1.55* 1.03*  CALCIUM 8.3* 7.4*  AST 16  --   ALT 14  --   ALKPHOS 82  --   BILITOT 0.6  --    ------------------------------------------------------------------------------------------------------------------ No results for input(s): CHOL, HDL, LDLCALC, TRIG, CHOLHDL, LDLDIRECT in the last 72 hours.  Lab Results  Component Value Date   HGBA1C 6.6 (H) 07/29/2012   ------------------------------------------------------------------------------------------------------------------ Recent Labs    01/07/19 1409  TSH 2.852   ------------------------------------------------------------------------------------------------------------------ No results for input(s): VITAMINB12, FOLATE, FERRITIN, TIBC, IRON, RETICCTPCT in the last 72 hours.  Coagulation profile Recent Labs  Lab 01/08/19 0422  INR 1.4*  Recent Labs    01/08/19 0120  DDIMER 1.19*    Cardiac Enzymes No results for input(s): CKMB, TROPONINI, MYOGLOBIN in the last 168 hours.  Invalid input(s): CK ------------------------------------------------------------------------------------------------------------------ No results found for: BNP  Inpatient  Medications  Scheduled Meds:  amLODipine  2.5 mg Oral Daily   cycloSPORINE  1 drop Both Eyes BID   feeding supplement (ENSURE ENLIVE)  237 mL Oral BID BM   fluticasone furoate-vilanterol  1 puff Inhalation Daily   And   umeclidinium bromide  1 puff Inhalation Daily   insulin aspart  0-5 Units Subcutaneous QHS   insulin aspart  0-9 Units Subcutaneous TID WC   levalbuterol  2 puff Inhalation TID   losartan  100 mg Oral Daily   pantoprazole  40 mg Oral Daily   predniSONE  40 mg Oral Q breakfast   Continuous Infusions:  azithromycin     cefTRIAXone (ROCEPHIN)  IV     heparin 900 Units/hr (01/08/19 0437)   PRN Meds:.acetaminophen, albuterol, ALPRAZolam, guaiFENesin-dextromethorphan, meclizine, traMADol  Micro Results Recent Results (from the past 240 hour(s))  Novel Coronavirus, NAA ([facility])     Status: None   Collection Time: 01/05/19 12:00 AM   Specimen: Oropharyngeal(OP) collection in vial transport medium   OROPHARYNGEA  TESTING  Result Value Ref Range Status   SARS-CoV-2, NAA Not Detected Not Detected Final    Comment: This nucleic acid amplification test was developed and its performance characteristics determined by [employer]. Nucleic acid amplification tests include PCR and TMA. This test has not been FDA cleared or approved. This test has been authorized by FDA under an Emergency Use Authorization (EUA). This test is only authorized for the duration of time the declaration that circumstances exist justifying the authorization of the emergency use of in vitro diagnostic tests for detection of SARS-CoV-2 virus and/or diagnosis of COVID-19 infection under section 564(b)(1) of the Act, 21 U.S.C. [id number]) (1), unless the authorization is terminated or revoked sooner. When diagnostic testing is negative, the possibility of a false negative result should be considered in the context of a patient's recent exposures and the presence of clinical  signs and symptoms consistent with COVID-19. An individual without symptoms of COVID-19 and who is not shedding SARS-CoV-2 virus would  expect to have a negative (not detected) result in this assay.   SARS CORONAVIRUS 2 (TAT 6-24 HRS) Nasopharyngeal Nasopharyngeal Swab     Status: None   Collection Time: 01/07/19 10:46 PM   Specimen: Nasopharyngeal Swab  Result Value Ref Range Status   SARS Coronavirus 2 NEGATIVE NEGATIVE Final    Comment: (NOTE) SARS-CoV-2 target nucleic acids are NOT DETECTED. The SARS-CoV-2 RNA is generally detectable in upper and lower respiratory specimens during the acute phase of infection. Negative results do not preclude SARS-CoV-2 infection, do not rule out co-infections with other pathogens, and should not be used as the sole basis for treatment or other patient management decisions. Negative results must be combined with clinical observations, patient history, and epidemiological information. The expected result is Negative. Fact Sheet for Patients: SugarRoll.be Fact Sheet for Healthcare Providers: https://www.woods-mathews.com/ This test is not yet approved or cleared by the [place] FDA and  has been authorized for detection and/or diagnosis of SARS-CoV-2 by FDA under an Emergency Use Authorization (EUA). This EUA will remain  in effect (meaning this test can be used) for the duration of the COVID-19 declaration under Section 56 4(b)(1) of the Act, 21 U.S.C. section 360bbb-3(b)(1), unless the authorization is terminated  or revoked sooner. Performed at [facility], [place] [address] [place], [place] [address]     Radiology Reports Dg Chest 2 View  Result Date: 01/07/2019 CLINICAL DATA:  Generalized weakness for 1 month. Orthostatic hypotension. EXAM: CHEST - 2 VIEW COMPARISON:  12/24/2018 and older exams. FINDINGS: Cardiac silhouette is normal in size. No mediastinal or hilar masses. No evidence  of adenopathy. Left lower lobe opacity obscures the posterior left hemidiaphragm, new since the prior chest radiograph, consistent pneumonia. Linear and reticular type opacities noted bilaterally most evident in the left mid to lower lung and in the right upper lobe, stable compared to the prior studies. No evidence of pulmonary edema. No pleural effusion or pneumothorax. Skeletal structures are intact. IMPRESSION: 1. Left lower lobe pneumonia. 2. Chronic changes both lungs, due to bronchiectasis bronchial wall thickening and mucous plugging, as noted on the CT dated 09/24/2016. Electronically Signed   By: Lajean Manes M.D.   On: 01/07/2019 14:36   Dg Chest 2 View  Result Date: 12/25/2018 CLINICAL DATA:  Bronchiectasis and LEFT rib pain, follow-up, diabetes mellitus, hypertension, COPD EXAM: CHEST - 2 VIEW COMPARISON:  06/01/2018 Correlation: CT chest 09/24/2016 FINDINGS: Normal heart size, mediastinal contours, and pulmonary vascularity. Calcified tortuous thoracic aorta. Scattered areas of interstitial thickening and scarring in both lungs corresponding to bronchovascular infiltrates, cystic bronchiectasis, and scattered reticulonodular interstitial disease changes seen on prior CT. Resolved LEFT lung infiltrate since previous study. Small nodular focus RIGHT upper lobe 6 mm diameter unchanged. No acute infiltrate, pleural effusion or pneumothorax. Minimal biconvex thoracolumbar scoliosis and diffuse osseous demineralization. IMPRESSION: Scattered chronic lung changes bilaterally, stable. No acute abnormalities. Electronically Signed   By: Lavonia Dana M.D.   On: 12/25/2018 09:11    Time Spent in minutes 35   Minie Roadcap M.D on 01/08/2019 at 11:36 AM  Between 7am to 7pm - Pager - [phone]  After 7pm go to [email or web address] - password [facility]  [facility] -  Office  [phone]

## 2019-01-08 NOTE — Progress Notes (Signed)
[place] for IV heparin Indication: elevated d-dimer, VQ pending  Allergies  Allergen Reactions  . Welchol [Colesevelam Hcl] Hives  . Glucosamine Other (See Comments)    Unknown  . Tramadol Nausea Only  . Celecoxib Hives, Itching and Rash  . Statins Rash    Patient Measurements: Height: 5\' 8"  (172.7 cm) Weight: 127 lb 13.9 oz (58 kg) IBW/kg (Calculated) : 63.9 Heparin Dosing Weight: actual  Vital Signs: Temp: 98.5 F (36.9 C) (09/18 1413) Temp Source: Oral (09/18 0433) BP: 130/66 (09/18 1413) Pulse Rate: 102 (09/18 1413)  Labs: Recent Labs    01/07/19 1409 01/08/19 0045 01/08/19 0422 01/08/19 0436 01/08/19 1343  HGB 10.7*  --  8.5*  --   --   HCT 33.1*  --  26.9*  --   --   PLT 403*  --  318  --   --   APTT  --   --  41*  --   --   LABPROT  --   --  16.8*  --   --   INR  --   --  1.4*  --   --   HEPARINUNFRC  --   --   --   --  <0.10*  CREATININE 1.55*  --   --  1.03*  --   TROPONINIHS  --  5  --  5  --     Estimated Creatinine Clearance: 32.6 mL/min (A) (by C-G formula based on SCr of 1.03 mg/dL (H)).   Medications:  Scheduled:  . amLODipine  2.5 mg Oral Daily  . cycloSPORINE  1 drop Both Eyes BID  . feeding supplement (ENSURE ENLIVE)  237 mL Oral BID BM  . fluticasone furoate-vilanterol  1 puff Inhalation Daily   And  . umeclidinium bromide  1 puff Inhalation Daily  . insulin aspart  0-5 Units Subcutaneous QHS  . insulin aspart  0-9 Units Subcutaneous TID WC  . levalbuterol  2 puff Inhalation TID  . pantoprazole  40 mg Oral Daily  . predniSONE  40 mg Oral Q breakfast   Infusions:  . azithromycin    . cefTRIAXone (ROCEPHIN)  IV    . heparin 900 Units/hr (01/08/19 0437)    Assessment: 85 yoF with PMH HTN, DM2, COPD/bronchiectasis, admitted for PNA. D-dimer elevated, so IV heparin per Pharmacy initiated for PE r/o. Patient underwent VQ scan d/t poor renal function.   Baseline INR, aPTT: both slightly  elevated, both drawn before starting heparin and nothing in labs or Hx to suggest liver disease  Prior anticoagulation: none  Significant events:  Today, 01/08/2019:  VQ scan intermediate probability of PE, no obvious mismatches; attempting CT scan later today  CBC: Hgb low at baseline, even lower on admission likely d/t hemodilution; Plt WNL  First heparin level undetectable on 900 units/hr; RN denies any issues with infusion and Nuc Med reports heparin continued to run throughout VQ scan without incident  No bleeding per RN  AKI on admission now resolved  Goal of Therapy: Heparin level 0.3-0.7 units/ml Monitor platelets by anticoagulation protocol: Yes  Plan:  Increase heparin IV infusion to 1100 units/hr and repeat 2000 unit bolus; prefer conservative dosing in 85 yo patient weighing 58 kg  Check heparin level 8 hrs after rate change  Daily CBC, daily heparin level once stable  Monitor for signs of bleeding or thrombosis   Reuel Boom, PharmD, BCPS [phone] 01/08/2019, 2:24 PM

## 2019-01-08 NOTE — Progress Notes (Signed)
CTA negative for PE. Shows LLL consolidation. D/c heparin drip.

## 2019-01-08 NOTE — Progress Notes (Signed)
Patients D dimer 1.19. Ordered to place CTA angio if d dimer elevated.  Receieved call from Radiology stating pt cant have CT contrast due to Low GFR =29. Stated pt will need nuclear scan. Will page on call.

## 2019-01-08 NOTE — Progress Notes (Signed)
Initial Nutrition Assessment  INTERVENTION:   -Ensure Enlive po BID, each supplement provides 350 kcal and 20 grams of protein  NUTRITION DIAGNOSIS:   Increased nutrient needs related to acute illness as evidenced by estimated needs.  GOAL:   Patient will meet greater than or equal to 90% of their needs  MONITOR:   PO intake, Supplement acceptance, Labs, Weight trends, I & O's  REASON FOR ASSESSMENT:   Malnutrition Screening Tool    ASSESSMENT:   85 year old female with hypertension, type 2 diabetes mellitus, history of COPD/bronchiectasis not on home O2, history of lip cancer status post radiation presented with nonproductive cough and left-sided pleuritic chest pain.  **RD working remotely**  Patient with history of lower lip cancer, treated with radiation. Pt was followed by [place] RD last year during treatment. No nutrition impact symptoms at that time.  No PO documented this admission yet. Ensure supplements have been ordered.  Per weight records, pt has lost 16 lbs since 1/7 (11% wt loss x 8 months, insignificant for time frame).   Medications reviewed. Labs reviewed: CBGs: 221-263 Low Na GFR: 47  NUTRITION - FOCUSED PHYSICAL EXAM:  Unable to perform - working remotely.  Diet Order:   Diet Order            Diet Carb Modified Fluid consistency: Thin; Room service appropriate? Yes  Diet effective now              EDUCATION NEEDS:   No education needs have been identified at this time  Skin:  Skin Assessment: Reviewed RN Assessment  Last BM:  9/18  Height:   Ht Readings from Last 1 Encounters:  01/07/19 5\' 8"  (1.727 m)    Weight:   Wt Readings from Last 1 Encounters:  01/07/19 58 kg    Ideal Body Weight:  63.6 kg  BMI:  Body mass index is 19.44 kg/m.  Estimated Nutritional Needs:   Kcal:  1500-1700  Protein:  70-80g  Fluid:  1.7L/day  Clayton Bibles, MS, RD, LDN Inpatient Clinical Dietitian Pager: [phone] After Hours  Pager: [phone]

## 2019-01-08 NOTE — Progress Notes (Signed)
ANTICOAGULATION CONSULT NOTE - Initial Consult  Pharmacy Consult for IV heparin Indication: elevated d-dimer, VQ pending  Allergies  Allergen Reactions  . Welchol [Colesevelam Hcl] Hives  . Glucosamine Other (See Comments)    Unknown  . Tramadol Nausea Only  . Celecoxib Hives, Itching and Rash  . Statins Rash    Patient Measurements: Height: 5\' 8"  (172.7 cm) Weight: 127 lb 13.9 oz (58 kg) IBW/kg (Calculated) : 63.9 Heparin Dosing Weight: actual  Vital Signs: Temp: 97.9 F (36.6 C) (09/17 2240) Temp Source: Oral (09/17 2240) BP: 116/60 (09/17 2240) Pulse Rate: 84 (09/17 2240)  Labs: Recent Labs    01/07/19 1409 01/08/19 0045  HGB 10.7*  --   HCT 33.1*  --   PLT 403*  --   CREATININE 1.55*  --   TROPONINIHS  --  5    Estimated Creatinine Clearance: 21.6 mL/min (A) (by C-G formula based on SCr of 1.55 mg/dL (H)).   Medical History: Past Medical History:  Diagnosis Date  . Acute nasopharyngitis (common cold)   . Arthritis   . Bronchiectasis with acute exacerbation ([place])   . Cancer ([place])    skin cancer  . COPD (chronic obstructive pulmonary disease) ([place])    sees dr. Annamaria Boots   . Cough   . Diabetes mellitus    fasting 130-150  . Dizziness   . Esophageal reflux   . History of radiation therapy 10/24/17- 12/05/17   Lower lip, 4.4 Gy X 10 fractions given twice weekly for a total dose of 44 Gy.  Marland Kitchen Hypertension   . Insomnia, unspecified   . Other symptoms involving nervous and musculoskeletal systems(781.99)   . Pneumonia    hx of  . Shortness of breath    exertion    Medications:  Scheduled:  . amLODipine  2.5 mg Oral Daily  . cycloSPORINE  1 drop Both Eyes BID  . enoxaparin (LOVENOX) injection  30 mg Subcutaneous Daily  . feeding supplement (ENSURE ENLIVE)  237 mL Oral BID BM  . fluticasone furoate-vilanterol  1 puff Inhalation Daily   And  . umeclidinium bromide  1 puff Inhalation Daily  . insulin aspart  0-5 Units Subcutaneous QHS  . insulin aspart   0-9 Units Subcutaneous TID WC  . levalbuterol  2 puff Inhalation TID  . losartan  100 mg Oral Daily  . methylPREDNISolone (SOLU-MEDROL) injection  60 mg Intravenous Q8H  . pantoprazole  40 mg Oral Daily   Infusions:  . sodium chloride Stopped (01/08/19 0012)  . azithromycin    . cefTRIAXone (ROCEPHIN)  IV      Assessment: 73 yoF admitted with cough and dyspnea.  Baseline labs: H/H = 10.7/33.1, plts = 403, aptt/INR pending  Goal of Therapy:  Heparin level 0.3-0.7 units/ml Monitor platelets by anticoagulation protocol: Yes   Plan:  Baseline coags STAT Heparin 2000 unit bolus x1 Start drip at 900 units/hr Daily CBC/HL Check 1st HL in 8 hours  F/u VQ results  Dorrene German 01/08/2019,3:34 AM

## 2019-01-08 NOTE — Progress Notes (Signed)
Pharmacy IV to PO conversion  This patient is receiving azithromycin by the intravenous route. Based on criteria approved by the Pharmacy and Therapeutics Committee, and the Infectious Disease Division, the antibiotic(s) is/are being converted to equivalent oral dose form(s). These criteria include:   Patient being treated for a respiratory tract infection, urinary tract infection, cellulitis, or Clostridium Difficile Associated Diarrhea  The patient is not neutropenic and does not exhibit a GI malabsorption state  The patient is eating (either orally or per tube) and/or has been taking other orally administered medications for at least 24 hours.  The patient is improving clinically (physician assessment and a 24-hour Tmax of <=100.5 F)  If you have any questions about this conversion, please contact the Pharmacy Department (ext [phone]).  Thank you.  Reuel Boom, PharmD, BCPS [phone] 01/08/2019, 2:52 PM

## 2019-01-09 LAB — BASIC METABOLIC PANEL
Anion gap: 10 (ref 5–15)
BUN: 22 mg/dL (ref 8–23)
CO2: 21 mmol/L — ABNORMAL LOW (ref 22–32)
Calcium: 7.8 mg/dL — ABNORMAL LOW (ref 8.9–10.3)
Chloride: 104 mmol/L (ref 98–111)
Creatinine, Ser: 0.86 mg/dL (ref 0.44–1.00)
GFR calc Af Amer: >60 mL/min (ref >60)
GFR calc non Af Amer: 59 mL/min — ABNORMAL LOW (ref >60)
Glucose, Bld: 237 mg/dL — ABNORMAL HIGH (ref 70–99)
Potassium: 3.5 mmol/L (ref 3.5–5.1)
Sodium: 135 mmol/L (ref 135–145)

## 2019-01-09 LAB — URINE CULTURE: Culture: 10000 — AB

## 2019-01-09 LAB — GLUCOSE, CAPILLARY
Glucose-Capillary: 227 mg/dL — ABNORMAL HIGH (ref 70–99)
Glucose-Capillary: 163 mg/dL — ABNORMAL HIGH (ref 70–99)
Glucose-Capillary: 297 mg/dL — ABNORMAL HIGH (ref 70–99)
Glucose-Capillary: 231 mg/dL — ABNORMAL HIGH (ref 70–99)

## 2019-01-09 LAB — CBC
HCT: 25.4 % — ABNORMAL LOW (ref 36.0–46.0)
Hemoglobin: 8.1 g/dL — ABNORMAL LOW (ref 12.0–15.0)
MCH: 27.9 pg (ref 26.0–34.0)
MCHC: 31.9 g/dL (ref 30.0–36.0)
MCV: 87.6 fL (ref 80.0–100.0)
Platelets: 354 10*3/uL (ref 150–400)
RBC: 2.9 MIL/uL — ABNORMAL LOW (ref 3.87–5.11)
RDW: 13.3 % (ref 11.5–15.5)
WBC: 9.1 10*3/uL (ref 4.0–10.5)
nRBC: 0 % (ref 0.0–0.2)

## 2019-01-09 MED ORDER — LEVALBUTEROL TARTRATE 45 MCG/ACT IN AERO
2.0000 | INHALATION_SPRAY | Freq: Two times a day (BID) | RESPIRATORY_TRACT | Status: DC
  Administered 2019-01-09 – 2019-01-10 (×2): 2 via RESPIRATORY_TRACT
  Filled 2019-01-09: qty 15

## 2019-01-09 MED ORDER — POTASSIUM CHLORIDE CRYS ER 20 MEQ PO TBCR
40.0000 meq | EXTENDED_RELEASE_TABLET | Freq: Once | ORAL | Status: AC
  Administered 2019-01-09: 40 meq via ORAL
  Filled 2019-01-09: qty 2

## 2019-01-09 NOTE — Progress Notes (Signed)
PROGRESS NOTE                                                                                                                                                                                                             Patient Demographics:    Katie Alvarado, is a 85 y.o. female, DOB - 03-15-1926, [id number]  Admit date - 01/07/2019   Admitting Physician Flora Lipps, MD  Outpatient Primary MD for the patient is Hulan Fess, MD  LOS - 2    Chief Complaint  Patient presents with   Weakness       Brief Narrative  85 year old female with hypertension, type 2 diabetes mellitus, history of COPD/bronchiectasis not on home O2, history of lip cancer status post radiation presented with nonproductive cough and left-sided pleuritic chest pain.  Denied any fevers, chills, nausea, vomiting, abdominal pain, bowel or urinary symptoms. In the ED she was tachycardic in the low 100s and O2 sat of 92% on room air. Blood work showed significant hyponatremia with sodium of 120, AKI with creatinine 1.55, WBC of 15 K and hemoglobin of 10.7.  Chest x-ray showed left lower lobe pneumonia.  Blood cultures ordered and patient placed on empiric Rocephin and azithromycin along with IV Solu-Medrol for lobar pneumonia and COPD exacerbation.  D-dimer was elevated so she was ordered for a VQ scan to rule out PE.   Subjective:   Denies further chest pain.  Shortness of breath better.  Still having nonproductive cough although slightly improved.  Assessment  & Plan :    Principal Problem: Left lobar pneumonia ([place]) On empiric Rocephin and azithromycin.  Blood cultures so far without growth.  Continue antitussives.  Sats stable on room air. Patient had a chest pain with shortness of breath, tachycardia and elevated d-dimer.  V/Q with intermediate probability for PE.  CT angiogram negative for PE.  IV heparin drip  discontinued.   COPD/bronchiectasis with mild exacerbation Continue nebs.  Has mild symptoms, continue oral prednisone.  Active problems   Hyponatremia Possibly combination of dehydration and SIADH due to pneumonia.  Improved with IV fluids   Acute kidney injury Suspect prerenal.  Avoid nephrotoxins and monitor with gentle hydration.  Hold losartan.   Type 2 diabetes mellitus, controlled cbg elevated .monitor on SSI  Essential hypertension Stable.  Holding losartan due to AKI.  generalized weakness Pt lives alone and has not ambulated much for past 2 weeks due to her current symptoms. PT eval  Code Status : Full code  Family Communication  : will update granddaughter  Disposition Plan  : home tomorrow if continues to improve  Barriers For Discharge : active symptoms Consults  :  none  Procedures  : vq scan/ CTA  DVT Prophylaxis  :  SQ Lovenox  Lab Results  Component Value Date   PLT 354 01/09/2019    Antibiotics  :   Anti-infectives (From admission, onward)   Start     Dose/Rate Route Frequency Ordered Stop   01/08/19 1600  azithromycin (ZITHROMAX) 500 mg in sodium chloride 0.9 % 250 mL IVPB  Status:  Discontinued     500 mg 250 mL/hr over 60 Minutes Intravenous Every 24 hours 01/07/19 2242 01/08/19 1452   01/08/19 1600  azithromycin (ZITHROMAX) tablet 500 mg     500 mg Oral Every 24 hours 01/08/19 1452     01/08/19 1500  cefTRIAXone (ROCEPHIN) 1 g in sodium chloride 0.9 % 100 mL IVPB     1 g 200 mL/hr over 30 Minutes Intravenous Every 24 hours 01/07/19 2242     01/07/19 1500  cefTRIAXone (ROCEPHIN) 1 g in sodium chloride 0.9 % 100 mL IVPB     1 g 200 mL/hr over 30 Minutes Intravenous  Once 01/07/19 1450 01/07/19 1559   01/07/19 1500  azithromycin (ZITHROMAX) tablet 500 mg     500 mg Oral  Once 01/07/19 1450 01/07/19 1504        Objective:   Vitals:   01/09/19 0508 01/09/19 0853 01/09/19 1017 01/09/19 1022  BP: 136/60  134/67 134/67  Pulse: 94    90  Resp: 16   20  Temp: 98 F (36.7 C)     TempSrc: Oral     SpO2: 98% 98%  98%  Weight:      Height:        Wt Readings from Last 3 Encounters:  01/07/19 58 kg  12/24/18 61.8 kg  10/05/18 66.3 kg     Intake/Output Summary (Last 24 hours) at 01/09/2019 1104 Last data filed at 01/09/2019 0856 Gross per 24 hour  Intake 223.25 ml  Output 200 ml  Net 23.25 ml    Physical exam  NAD  HEENT: moist mucosa, supple neck  chest: coarse breath sounds over left lung base CVS: NS1&S2, no murmurs GI: soft, NT, ND Musculoskeletal: warm, no edema       Data Review:    CBC Recent Labs  Lab 01/07/19 1409 01/08/19 0422 01/09/19 0453  WBC 15.3* 9.0 9.1  HGB 10.7* 8.5* 8.1*  HCT 33.1* 26.9* 25.4*  PLT 403* 318 354  MCV 86.6 89.1 87.6  MCH 28.0 28.1 27.9  MCHC 32.3 31.6 31.9  RDW 13.2 13.3 13.3  LYMPHSABS 1.4  --   --   MONOABS 1.4*  --   --   EOSABS 0.2  --   --   BASOSABS 0.1  --   --     Chemistries  Recent Labs  Lab 01/07/19 1409 01/08/19 0436 01/09/19 0453  NA 120* 131* 135  K 4.3 4.4 3.5  CL 86* 103 104  CO2 19* 17* 21*  GLUCOSE 172* 164* 237*  BUN 28* 23 22  CREATININE 1.55* 1.03* 0.86  CALCIUM 8.3* 7.4* 7.8*  AST 16  --   --   ALT 14  --   --  ALKPHOS 82  --   --   BILITOT 0.6  --   --    ------------------------------------------------------------------------------------------------------------------ No results for input(s): CHOL, HDL, LDLCALC, TRIG, CHOLHDL, LDLDIRECT in the last 72 hours.  Lab Results  Component Value Date   HGBA1C 6.6 (H) 07/29/2012   ------------------------------------------------------------------------------------------------------------------ Recent Labs    01/07/19 1409  TSH 2.852   ------------------------------------------------------------------------------------------------------------------ No results for input(s): VITAMINB12, FOLATE, FERRITIN, TIBC, IRON, RETICCTPCT in the last 72 hours.  Coagulation  profile Recent Labs  Lab 01/08/19 0422  INR 1.4*    Recent Labs    01/08/19 0120  DDIMER 1.19*    Cardiac Enzymes No results for input(s): CKMB, TROPONINI, MYOGLOBIN in the last 168 hours.  Invalid input(s): CK ------------------------------------------------------------------------------------------------------------------ No results found for: BNP  Inpatient Medications  Scheduled Meds:  amLODipine  2.5 mg Oral Daily   azithromycin  500 mg Oral Q24H   cycloSPORINE  1 drop Both Eyes BID   enoxaparin (LOVENOX) injection  40 mg Subcutaneous Q24H   feeding supplement (ENSURE ENLIVE)  237 mL Oral BID BM   fluticasone furoate-vilanterol  1 puff Inhalation Daily   And   umeclidinium bromide  1 puff Inhalation Daily   insulin aspart  0-5 Units Subcutaneous QHS   insulin aspart  0-9 Units Subcutaneous TID WC   levalbuterol  2 puff Inhalation TID   pantoprazole  40 mg Oral Daily   predniSONE  40 mg Oral Q breakfast   Continuous Infusions:  cefTRIAXone (ROCEPHIN)  IV 1 g (01/08/19 1457)   PRN Meds:.acetaminophen, albuterol, ALPRAZolam, guaiFENesin-dextromethorphan, meclizine, traMADol  Micro Results Recent Results (from the past 240 hour(s))  Novel Coronavirus, NAA ([facility])     Status: None   Collection Time: 01/05/19 12:00 AM   Specimen: Oropharyngeal(OP) collection in vial transport medium   OROPHARYNGEA  TESTING  Result Value Ref Range Status   SARS-CoV-2, NAA Not Detected Not Detected Final    Comment: This nucleic acid amplification test was developed and its performance characteristics determined by [employer]. Nucleic acid amplification tests include PCR and TMA. This test has not been FDA cleared or approved. This test has been authorized by FDA under an Emergency Use Authorization (EUA). This test is only authorized for the duration of time the declaration that circumstances exist justifying the authorization of the emergency use of in  vitro diagnostic tests for detection of SARS-CoV-2 virus and/or diagnosis of COVID-19 infection under section 564(b)(1) of the Act, 21 U.S.C. [id number]) (1), unless the authorization is terminated or revoked sooner. When diagnostic testing is negative, the possibility of a false negative result should be considered in the context of a patient's recent exposures and the presence of clinical signs and symptoms consistent with COVID-19. An individual without symptoms of COVID-19 and who is not shedding SARS-CoV-2 virus would  expect to have a negative (not detected) result in this assay.   Urine culture     Status: Abnormal   Collection Time: 01/07/19  2:00 PM   Specimen: Urine, Clean Catch  Result Value Ref Range Status   Specimen Description   Final    URINE, CLEAN CATCH Performed at [facility], [address] [place], [place] [address]    Special Requests   Final    NONE Performed at [facility], [address] [place], [place] [address]    Culture 10,000 COLONIES/mL STAPHYLOCOCCUS WARNERI (A)  Final   Report Status 01/09/2019 FINAL  Final   Organism ID, Bacteria  STAPHYLOCOCCUS WARNERI (A)  Final      Susceptibility   Staphylococcus warneri - MIC*    CIPROFLOXACIN >=8 RESISTANT Resistant     GENTAMICIN <=0.5 SENSITIVE Sensitive     NITROFURANTOIN 32 SENSITIVE Sensitive     OXACILLIN RESISTANT Resistant     TETRACYCLINE <=1 SENSITIVE Sensitive     VANCOMYCIN <=0.5 SENSITIVE Sensitive     TRIMETH/SULFA <=10 SENSITIVE Sensitive     CLINDAMYCIN RESISTANT Resistant     RIFAMPIN <=0.5 SENSITIVE Sensitive     Inducible Clindamycin POSITIVE Resistant     * 10,000 COLONIES/mL STAPHYLOCOCCUS WARNERI  Culture, blood (routine x 2)     Status: None (Preliminary result)   Collection Time: 01/07/19  2:55 PM   Specimen: Right Antecubital; Blood  Result Value Ref Range Status   Specimen Description   Final    RIGHT ANTECUBITAL Performed at [facility], [address] [place], [place] [address]    Special Requests   Final    BOTTLES DRAWN AEROBIC AND ANAEROBIC Blood Culture adequate volume Performed at [facility], [address] [place], [place] [address]    Culture   Final    NO GROWTH < 24 HOURS Performed at [facility], [place] [address], [place], [place] [address]    Report Status PENDING  Incomplete  Culture, blood (routine x 2)     Status: None (Preliminary result)   Collection Time: 01/07/19  3:00 PM   Specimen: BLOOD RIGHT HAND  Result Value Ref Range Status   Specimen Description   Final    BLOOD RIGHT HAND Performed at [facility], [address] [place], [place] [address]    Special Requests   Final    BOTTLES DRAWN AEROBIC AND ANAEROBIC Blood Culture results may not be optimal due to an inadequate volume of blood received in culture bottles Performed at [facility], [address] [place], [place] [address]    Culture   Final    NO GROWTH < 24 HOURS Performed at [facility], [place] [address], [place], [place] [address]    Report Status PENDING  Incomplete  SARS CORONAVIRUS 2 (TAT 6-24 HRS) Nasopharyngeal Nasopharyngeal Swab     Status: None   Collection Time: 01/07/19 10:46 PM   Specimen: Nasopharyngeal Swab  Result Value Ref Range Status   SARS Coronavirus 2 NEGATIVE NEGATIVE Final    Comment: (NOTE) SARS-CoV-2 target nucleic acids are NOT DETECTED. The SARS-CoV-2 RNA is generally detectable in upper and lower respiratory specimens during the acute phase of infection. Negative results do not preclude SARS-CoV-2 infection, do not rule out co-infections with other pathogens, and should not be used as the sole basis for treatment or other patient management decisions. Negative results must be combined with clinical observations, patient history, and epidemiological information. The expected result is Negative. Fact Sheet for  Patients: SugarRoll.be Fact Sheet for Healthcare Providers: https://www.woods-mathews.com/ This test is not yet approved or cleared by the [place] FDA and  has been authorized for detection and/or diagnosis of SARS-CoV-2 by FDA under an Emergency Use Authorization (EUA). This EUA will remain  in effect (meaning this test can be used) for the duration of the COVID-19 declaration under Section 56 4(b)(1) of the Act, 21 U.S.C. section 360bbb-3(b)(1), unless the authorization is terminated or revoked sooner. Performed at [facility], [place] [address], [place], [place] [address]  Radiology Reports Dg Chest 2 View  Result Date: 01/07/2019 CLINICAL DATA:  Generalized weakness for 1 month. Orthostatic hypotension. EXAM: CHEST - 2 VIEW COMPARISON:  12/24/2018 and older exams. FINDINGS: Cardiac silhouette is normal in size. No mediastinal or hilar masses. No evidence of adenopathy. Left lower lobe opacity obscures the posterior left hemidiaphragm, new since the prior chest radiograph, consistent pneumonia. Linear and reticular type opacities noted bilaterally most evident in the left mid to lower lung and in the right upper lobe, stable compared to the prior studies. No evidence of pulmonary edema. No pleural effusion or pneumothorax. Skeletal structures are intact. IMPRESSION: 1. Left lower lobe pneumonia. 2. Chronic changes both lungs, due to bronchiectasis bronchial wall thickening and mucous plugging, as noted on the CT dated 09/24/2016. Electronically Signed   By: Lajean Manes M.D.   On: 01/07/2019 14:36   Dg Chest 2 View  Result Date: 12/25/2018 CLINICAL DATA:  Bronchiectasis and LEFT rib pain, follow-up, diabetes mellitus, hypertension, COPD EXAM: CHEST - 2 VIEW COMPARISON:  06/01/2018 Correlation: CT chest 09/24/2016 FINDINGS: Normal heart size, mediastinal contours, and pulmonary vascularity. Calcified tortuous thoracic aorta. Scattered  areas of interstitial thickening and scarring in both lungs corresponding to bronchovascular infiltrates, cystic bronchiectasis, and scattered reticulonodular interstitial disease changes seen on prior CT. Resolved LEFT lung infiltrate since previous study. Small nodular focus RIGHT upper lobe 6 mm diameter unchanged. No acute infiltrate, pleural effusion or pneumothorax. Minimal biconvex thoracolumbar scoliosis and diffuse osseous demineralization. IMPRESSION: Scattered chronic lung changes bilaterally, stable. No acute abnormalities. Electronically Signed   By: Lavonia Dana M.D.   On: 12/25/2018 09:11   Ct Angio Chest Pe W Or Wo Contrast  Result Date: 01/08/2019 CLINICAL DATA:  Nonproductive cough and left pleuritic chest pain. Tachycardia, shortness of breath and hypoxemia. History of diabetes, COPD/bronchiectasis and lip cancer. EXAM: CT ANGIOGRAPHY CHEST WITH CONTRAST TECHNIQUE: Multidetector CT imaging of the chest was performed using the standard protocol during bolus administration of intravenous contrast. Multiplanar CT image reconstructions and MIPs were obtained to evaluate the vascular anatomy. CONTRAST:  86mL OMNIPAQUE IOHEXOL 350 MG/ML SOLN COMPARISON:  Chest CT 09/24/2016. Nuclear medicine lung scan today. Chest radiographs yesterday. FINDINGS: Cardiovascular: The pulmonary arteries are well opacified with contrast to the level of the subsegmental branches. There is no evidence of acute pulmonary embolism. There is diffuse atherosclerosis of the aorta, great vessels and coronary arteries. No acute vascular findings. The heart size is normal. There is no pericardial effusion. Mediastinum/Nodes: There are no enlarged mediastinal, hilar or axillary lymph nodes. The thyroid gland, trachea and esophagus demonstrate no significant findings. Lungs/Pleura: There is no pleural effusion or pneumothorax. There is mild centrilobular emphysema. Extensive cylindrical and varicose bronchiectasis is again noted,  most advanced in the lingula. There is new dependent consolidation in the left lower lobe suspicious for pneumonia. There is mildly increased nodular airspace disease within the aerated portions of the left lower lobe. Additional peribronchovascular nodularity and areas of mucous plugging are similar to the previous study. Upper abdomen: The visualized upper abdomen appears stable without acute findings. Musculoskeletal/Chest wall: There is no chest wall mass or suspicious osseous finding. Review of the MIP images confirms the above findings. IMPRESSION: 1. No evidence of acute pulmonary embolism. 2. Left lower lobe consolidation consistent with pneumonia. Followup PA and lateral chest X-ray is recommended in 3-4 weeks following trial of antibiotic therapy to ensure resolution and exclude underlying malignancy. 3. 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 4. Coronary and Aortic Atherosclerosis (ICD10-I70.0). Electronically Signed   By: Richardean Sale M.D.   On: 01/08/2019 17:26   Nm Pulmonary Perf And Vent  Result Date: 01/08/2019 CLINICAL DATA:  Shortness of breath. Positive D-dimer study. History of carcinoma arising from lips EXAM: NUCLEAR MEDICINE VENTILATION - PERFUSION LUNG SCAN VIEWS: Anterior, posterior, left lateral, right lateral, RPO, LPO, RAO, LAO-ventilation and perfusion RADIOPHARMACEUTICALS:  29.0 mCi of Tc-82m DTPA aerosol inhalation and 1.5 mCi Tc75m MAA IV COMPARISON:  Chest radiograph January 07, 2019 FINDINGS: Ventilation: There is significant decreased ventilation throughout most of the left lower lobe at the site of pneumonia on recent chest radiograph. There is a ventilation defect in the anterior segment of the right lower lobe. Perfusion: There are matching perfusion defects throughout much of the left lower lobe and in the  anterior segment of the right lower lobe. No appreciable ventilation/perfusion mismatch evident. IMPRESSION: Matching ventilation and perfusion defects, larger on the left than on the right. No appreciable ventilation/perfusion mismatch. Note that there is a significant defect on the chest radiograph in the left lower lobe. This combination of findings places this study in an intermediate probability category for pulmonary embolus based on PIOPED II guidelines. Electronically Signed   By: Lowella Grip III M.D.   On: 01/08/2019 12:20    Time Spent in minutes 25   Hagan Maltz M.D on 01/09/2019 at 11:04 AM  Between 7am to 7pm - Pager - [phone]  After 7pm go to [email or web address] - password [facility]  [facility] -  Office  [phone]

## 2019-01-09 NOTE — Evaluation (Signed)
Physical Therapy Evaluation Patient Details Name: Katie Alvarado MRN: [id number] DOB: 06/08/1925 Today's Date: 01/09/2019   History of Present Illness  85 year old female with hypertension, type 2 diabetes mellitus, history of COPD/bronchiectasis not on home O2, history of lip cancer status post radiation presented with nonproductive cough and left-sided pleuritic chest pain---V/Q with intermediate probability for PE.  CT angiogram negative for PE.  IV heparin drip discontinued.  Clinical Impression  Pt admitted with above diagnosis.  Pt known to me from previous admission earlier this year; pt amb in hallway with min to min/guard assist. SpO2 = 97-98% on RA with HR in low 100s. Will continue to follow in acute setting however doubt pt will need f/u post acute   Pt currently with functional limitations due to the deficits listed below (see PT Problem List). Pt will benefit from skilled PT to increase their independence and safety with mobility to allow discharge to the venue listed below.       Follow Up Recommendations No PT follow up    Equipment Recommendations  None recommended by PT    Recommendations for Other Services       Precautions / Restrictions Precautions Precautions: Fall Restrictions Weight Bearing Restrictions: No      Mobility  Bed Mobility Overal bed mobility: Modified Independent                Transfers Overall transfer level: Needs assistance Equipment used: Rolling walker (2 wheeled) Transfers: Sit to/from Stand Sit to Stand: Min assist         General transfer comment: assist to rise--posterior LOB on initial standing, cues for hand placement  Ambulation/Gait Ambulation/Gait assistance: Min guard;Min assist Gait Distance (Feet): 280 Feet Assistive device: Rolling walker (2 wheeled) Gait Pattern/deviations: Step-through pattern;Decreased stride length     General Gait Details: fatigues with incr distance, no LOB over level, smooth  surfaces, intermittent assist to maneuver RW  Stairs            Wheelchair Mobility    Modified Rankin (Stroke Patients Only)       Balance Overall balance assessment: Needs assistance Sitting-balance support: Feet supported;No upper extremity supported Sitting balance-Leahy Scale: Good       Standing balance-Leahy Scale: Fair Standing balance comment: reliant on UEs for dynamic tasks                             Pertinent Vitals/Pain Pain Assessment: No/denies pain    Home Living Family/patient expects to be discharged to:: Private residence Living Arrangements: Alone Available Help at Discharge: Neighbor;Available PRN/intermittently Type of Home: House Home Access: Ramped entrance     Home Layout: One level Home Equipment: [place] - 4 wheels;Bedside commode;Shower seat - built in;Cane - single point      Prior Function Level of Independence: Independent with assistive device(s)         Comments: has cane and rollator.  Has cleaning lady, but pt does own cooking and laundry--unless her friend Fritz Pickerel helps with it, he will assist as needed, bring food etc     Hand Dominance        Extremity/Trunk Assessment   Upper Extremity Assessment Upper Extremity Assessment: Overall WFL for tasks assessed    Lower Extremity Assessment Lower Extremity Assessment: Overall WFL for tasks assessed       Communication   Communication: No difficulties  Cognition Arousal/Alertness: Awake/alert   Overall Cognitive Status: Within Functional Limits for tasks  assessed                                        General Comments   pt is very pleasant and coopertive    Exercises     Assessment/Plan    PT Assessment Patient needs continued PT services  PT Problem List Decreased strength;Decreased activity tolerance;Decreased mobility;Pain;Decreased knowledge of use of DME       PT Treatment Interventions DME instruction;Functional  mobility training;Therapeutic activities;Therapeutic exercise;Gait training;Patient/family education;Balance training    PT Goals (Current goals can be found in the Care Plan section)  Acute Rehab PT Goals Patient Stated Goal: to go home soon PT Goal Formulation: With patient Time For Goal Achievement: 01/22/19 Potential to Achieve Goals: Good    Frequency Min 3X/week   Barriers to discharge        Co-evaluation               AM-PAC PT "6 Clicks" Mobility  Outcome Measure Help needed turning from your back to your side while in a flat bed without using bedrails?: None Help needed moving from lying on your back to sitting on the side of a flat bed without using bedrails?: None Help needed moving to and from a bed to a chair (including a wheelchair)?: A Little Help needed standing up from a chair using your arms (e.g., wheelchair or bedside chair)?: A Little Help needed to walk in hospital room?: A Little Help needed climbing 3-5 steps with a railing? : A Little 6 Click Score: 20    End of Session Equipment Utilized During Treatment: Gait belt Activity Tolerance: Patient tolerated treatment well Patient left: with bed alarm set;in bed;with call bell/phone within reach   PT Visit Diagnosis: Difficulty in walking, not elsewhere classified (R26.2)    Time: [id number] PT Time Calculation (min) (ACUTE ONLY): 22 min   Charges:   PT Evaluation $PT Eval Low Complexity: 1 Low          Kenyon Ana, PT  Pager: [phone] Acute Rehab Dept [facility]): [id number]   01/09/2019   [facility] 01/09/2019, 3:17 PM

## 2019-01-10 DIAGNOSIS — J479 Bronchiectasis, uncomplicated: Secondary | ICD-10-CM

## 2019-01-10 DIAGNOSIS — E860 Dehydration: Secondary | ICD-10-CM

## 2019-01-10 DIAGNOSIS — N179 Acute kidney failure, unspecified: Secondary | ICD-10-CM | POA: Diagnosis present

## 2019-01-10 LAB — GLUCOSE, CAPILLARY
Glucose-Capillary: 159 mg/dL — ABNORMAL HIGH (ref 70–99)
Glucose-Capillary: 218 mg/dL — ABNORMAL HIGH (ref 70–99)

## 2019-01-10 MED ORDER — ENSURE ENLIVE PO LIQD: 237.0000 mL | Freq: Two times a day (BID) | ORAL | 12 refills | Status: DC

## 2019-01-10 MED ORDER — LEVOFLOXACIN 500 MG PO TABS: 500.0000 mg | ORAL_TABLET | Freq: Every day | ORAL | 0 refills | Status: DC

## 2019-01-10 MED ORDER — GUAIFENESIN-DM 100-10 MG/5ML PO SYRP: 5.0000 mL | ORAL_SOLUTION | ORAL | 0 refills | Status: DC | PRN

## 2019-01-10 NOTE — TOC Initial Note (Signed)
Transition of Care [facility]) - Initial/Assessment Note    Patient Details  Name: Katie Alvarado MRN: [id number] Date of Birth: [birth year]  Transition of Care (TOC) CM/SW Contact:    Joaquin Courts, RN Phone Number: 01/10/2019, 11:31 AM  Clinical Narrative: [facility] with [facility]. [employer] to deliver light weight manual wheelchair for home use.                   Expected Discharge Plan: [place] Barriers to Discharge: No Barriers Identified   Patient Goals and CMS Choice Patient states their goals for this hospitalization and ongoing recovery are:: to go home CMS Medicare.gov Compare Post Acute Care list provided to:: Patient Choice offered to / list presented to : Patient  Expected Discharge Plan and Services Expected Discharge Plan: [place]   Discharge Planning Services: CM Consult Post Acute Care Choice: [place] arrangements for the past 2 months: Single Family Home Expected Discharge Date: 01/10/19               DME Arranged: Youth worker wheelchair with seat cushion DME Agency: [employer] Date DME Agency Contacted: 01/10/19 Time DME Agency Contacted: 69 Representative spoke with at DME Agency: [place]: RN [place] Agency: [place] ([place]) Date [place]: 01/10/19 Time [place]: 57 Representative spoke with at [place]: Corene Cornea  Prior Living Arrangements/Services Living arrangements for the past 2 months: [place] Lives with:: Self Patient language and need for interpreter reviewed:: Yes Do you feel safe going back to the place where you live?: Yes      Need for Family Participation in Patient Care: Yes (Comment) Care giver support system in place?: Yes (comment)   Criminal Activity/Legal Involvement Pertinent to Current Situation/Hospitalization: No - Comment as needed  Activities of Daily Living Home Assistive  Devices/Equipment: Blood pressure cuff, CBG Meter, Eyeglasses, Walker (specify type) ADL Screening (condition at time of admission) Patient's cognitive ability adequate to safely complete daily activities?: Yes Is the patient deaf or have difficulty hearing?: Yes Does the patient have difficulty seeing, even when wearing glasses/contacts?: Yes Does the patient have difficulty concentrating, remembering, or making decisions?: Yes Patient able to express need for assistance with ADLs?: Yes Does the patient have difficulty dressing or bathing?: No Independently performs ADLs?: Yes (appropriate for developmental age) Does the patient have difficulty walking or climbing stairs?: Yes Weakness of Legs: Both Weakness of Arms/Hands: Both  Permission Sought/Granted                  Emotional Assessment Appearance:: Appears stated age Attitude/Demeanor/Rapport: Engaged Affect (typically observed): Accepting Orientation: : Oriented to Self, Oriented to Place, Oriented to  Time, Oriented to Situation   Psych Involvement: No (comment)  Admission diagnosis:  Hyponatremia [E87.1] Weakness [R53.1] Community acquired pneumonia of left lower lobe of lung ([place]) [J18.1] Patient Active Problem List   Diagnosis Date Noted  . AKI (acute kidney injury) ([place]) 01/10/2019  . Community acquired pneumonia of left lower lobe of lung ([place]) 01/07/2019  . Thrombocytosis ([place]) 12/25/2018  . Left-sided chest wall pain 06/01/2018  . Back pain 05/05/2018  . Low back pain radiating down leg 05/05/2018  . Low back pain 05/05/2018  . Pneumonia of left lower lobe due to infectious organism ([place]) 04/28/2018  . 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 Allergic urticaria 03/05/2016  . Gastritis 03/05/2016  . Chronic rhinitis 01/09/2016  .  Pruritus 09/05/2015  . 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 Anemia 07/18/2015  . Bronchiectasis without complication ([place]) 0000000  . Renal artery  stenosis ([place]) 04/25/2015  . Essential hypertension 04/25/2015  . Lower extremity pain, bilateral 08/04/2012  . Diarrhea 08/03/2012  . Klebsiella infection 08/01/2012  . DM type 2 (diabetes mellitus, type 2) ([place]) 07/29/2012  . Generalized weakness 07/29/2012  . Weakness 04/11/2011  . Hyponatremia 04/11/2011  . UTI (lower urinary tract infection) 04/11/2011  . ARF (acute renal failure) ([place]) 04/11/2011  . Leucocytosis 04/11/2011  . Dehydration 04/11/2011  . Hypotension 04/11/2011  . BRONCHIECTASIS, + [facility] 03/25/2010  . DIZZINESS 02/02/2010  . MUSCULOSKELETAL PAIN 09/25/2007  . INSOMNIA 05/20/2007  . ACUTE NASOPHARYNGITIS 04/20/2007  . Bronchiectasis with acute exacerbation ([place]) 04/20/2007  . Esophageal reflux 04/20/2007   PCP:  Hulan Fess, MD Pharmacy:   [place], [address] [place] [place] [address] Phone: [phone] Fax: [phone]  [id number] - [place], [place] - [address] [place] [address] Phone: [phone] Fax: [phone]  [place], [address] [place] [address] Phone: [phone] Fax: [phone]     Social Determinants of Health (SDOH) Interventions    Readmission Risk Interventions No flowsheet data found.

## 2019-01-10 NOTE — Discharge Summary (Signed)
Physician Discharge Summary  Katie Alvarado [id number] DOB: [birth year] DOA: 01/07/2019  PCP: Hulan Fess, MD  Admit date: 01/07/2019 Discharge date: 01/10/2019  Admitted From: Home Disposition: Home  Recommendations for Outpatient Follow-up:  1. Patient has appointment with her PCP later this week. 2. She will be discharged on oral Levaquin to complete total 7 days of antibiotic. 3. Recommend follow-up chest x-ray in 4-6 weeks to ensure resolution of the pneumonia.   Home Health: RN Equipment/Devices: Wheelchair.  Patient has been using wheelchair at home for several months to ambulate around the house for safety reasons.  Her current wheelchair is malfunctioned and needs a new one which is ordered.  Discharge Condition: Fair CODE STATUS: DNR (discussed at length with patient and patient does not want any aggressive measures including mechanical ventilation, CPR, cardiac resuscitation including cardioversion or defibrillator.)  Diet recommendation: Carb modified    Discharge Diagnoses:  Principal Problem: Left lobar pneumonia ([place])  Active Problems: Hyponatremia   Dehydration   DM type 2 (diabetes mellitus, type 2) (HCC)   Generalized weakness   Bronchiectasis without complication ([place])   Essential hypertension   AKI (acute kidney injury) ([place])   Brief narrative/HPI 85 year old female with hypertension, type 2 diabetes mellitus, history of COPD/bronchiectasis not on home O2, history of lip cancer status post radiation presented with nonproductive cough and left-sided pleuritic chest pain.  Denied any fevers, chills, nausea, vomiting, abdominal pain, bowel or urinary symptoms. In the ED she was tachycardic in the low 100s and O2 sat of 92% on room air. Blood work showed significant hyponatremia with sodium of 120, AKI with creatinine 1.55, WBC of 15 K and hemoglobin of 10.7.  Chest x-ray showed left lower lobe pneumonia.  Blood cultures ordered and patient placed on  empiric Rocephin and azithromycin along with IV Solu-Medrol for lobar pneumonia and COPD exacerbation.  D-dimer was elevated so she was ordered for a VQ scan to rule out PE.  Hospital course   Principal Problem: Left lobar pneumonia ([place]) On empiric Rocephin and azithromycin.    Blood culture negative for growth.  Symptoms improved with antitussives.  Will discharge on oral Levaquin to complete total 7-day course of antibiotic. Patient had a chest pain with shortness of breath, tachycardia and elevated d-dimer.  V/Q with intermediate probability for PE.  CT angiogram negative for PE.  IV heparin drip discontinued.  Patient clinically stable to be discharged home with outpatient follow-up.  Active problems   Hyponatremia Possibly combination of dehydration and SIADH due to pneumonia.    Resolved with fluids.  Acute kidney injury Suspect prerenal.    Resolved with IV fluids.  Hold losartan.  Can resume metformin upon discharge.  Instructed to avoid NSAIDs.   Type 2 diabetes mellitus, controlled with hyperglycemia CBG elevated likely due to prednisone which has been discontinued.  Resume home dose metformin.  COPD/bronchiectasis with mild exacerbation Given nebs with improvement.  Was on oral prednisone causing her sugars to be elevated and have been discontinued.  Resume home inhaler and nebs.   Essential hypertension Suspect patient had orthostasis with significant weakness and hyponatremia.  Patient is on 4 different blood pressure medications at home.  (Amlodipine, triamterene-Maxide, losartan).  Reduced amlodipine dose to 2.5 mg daily and held triamterene-Maxide and losartan with outpatient PCP follow-up.   generalized weakness Pt lives alone and has not ambulated much for past 2 weeks due to her current symptoms.  Seen by PT who recommends no home needs.  Given her issues with  possible orthostasis, weakness, and hyperglycemia arrange home health RN.  Family  Communication  :  Updated granddaughter on the phone  Disposition Plan  : home    Consults  :  none  Procedures  : vq scan/ CTA  Discharge Instructions   Allergies as of 01/10/2019      Reactions   Welchol [colesevelam Hcl] Hives   Glucosamine Other (See Comments)   Unknown   Tramadol Nausea Only   Celecoxib Hives, Itching, Rash   Statins Rash      Medication List    STOP taking these medications   acetaminophen-codeine 300-30 MG tablet Commonly known as: TYLENOL #3   budesonide 0.25 MG/2ML nebulizer solution Commonly known as: PULMICORT   chlorpheniramine-HYDROcodone 10-8 MG/5ML Suer Commonly known as: Tussionex Pennkinetic ER   Fluticasone-Umeclidin-Vilant 100-62.5-25 MCG/INH Aepb Commonly known as: Trelegy Ellipta   ipratropium 0.02 % nebulizer solution Commonly known as: ATROVENT   losartan 100 MG tablet Commonly known as: COZAAR   predniSONE 10 MG tablet Commonly known as: DELTASONE   triamterene-hydrochlorothiazide 37.5-25 MG tablet Commonly known as: MAXZIDE-25     TAKE these medications   acetaminophen 325 MG tablet Commonly known as: TYLENOL Take 2 tablets (650 mg total) by mouth every 6 (six) hours as needed for pain. What changed: how much to take   ALPRAZolam 0.25 MG tablet Commonly known as: XANAX Take 0.25 mg by mouth 2 (two) times daily as needed for sleep or anxiety.   amLODipine 5 MG tablet Commonly known as: NORVASC Take 2.5 mg by mouth daily.   cholecalciferol 1000 units tablet Commonly known as: VITAMIN D Take 2,000 Units by mouth 2 (two) times a week.   Cinnamon 500 MG capsule Take 1,000 mg by mouth 2 (two) times daily as needed (for sugar levels).   cycloSPORINE 0.05 % ophthalmic emulsion Commonly known as: RESTASIS Place 1 drop into both eyes at bedtime as needed (dry eyes).   DRY EYES OP Apply 1 drop to eye 2 (two) times daily as needed (for dry eyes).   esomeprazole 40 MG capsule Commonly known as: NEXIUM Take  40 mg by mouth daily as needed (heart burn).   feeding supplement (ENSURE ENLIVE) Liqd Take 237 mLs by mouth 2 (two) times daily between meals.   Flutter Devi Use as directed   guaiFENesin-dextromethorphan 100-10 MG/5ML syrup Commonly known as: ROBITUSSIN DM Take 5 mLs by mouth every 4 (four) hours as needed for cough.   levofloxacin 500 MG tablet Commonly known as: LEVAQUIN Take 1 tablet (500 mg total) by mouth daily.   meclizine 25 MG tablet Commonly known as: ANTIVERT Take 25 mg by mouth every 6 (six) hours as needed for dizziness.   metFORMIN 500 MG tablet Commonly known as: GLUCOPHAGE Take 1,000 mg by mouth 2 (two) times daily.   ONE TOUCH ULTRA TEST test strip Generic drug: glucose blood USE TO CHECK BLOOD SUGAR ONCE DAILY 90   OneTouch Delica Lancets [id number] Misc USE TO CHECK BLOOD SUGAR ONCE DAILY AS DIRECTED   Perforomist 20 MCG/2ML nebulizer solution Generic drug: formoterol Take 2 mLs (20 mcg total) by nebulization 2 (two) times daily.   ProAir HFA 108 (90 Base) MCG/ACT inhaler Generic drug: albuterol INHALE 2 PUFFS INTO THE LUNGS EVERY 6 HOURS AS NEEDED FOR WHEEZING OR SHORTNESS OF BREATH What changed: See the new instructions.   traMADol 50 MG tablet Commonly known as: ULTRAM Take 0.5 tablets (25 mg total) by mouth every 6 (six) hours as needed for moderate  pain.   vitamin E 400 UNIT capsule Take 400 Units by mouth 2 (two) times a week.            Durable Medical Equipment  (From admission, onward)         Start     Ordered   01/09/19 1731  For home use only DME lightweight manual wheelchair with seat cushion  Once    Comments: Patient suffers from chronic ankle injury  which impairs their ability to perform daily activities like ADLs in the home.  A walking aid will not resolve . she uses wheelchair frequently to ambulate around the house and needs a new one due to malfunction.  issue with performing activities of daily living. A wheelchair will  allow patient to safely perform daily activities. Patient is not able to propel themselves in the home using a standard weight wheelchair due to weakness. Patient can self propel in the lightweight wheelchair.   01/09/19 1732         Follow-up Information    Little, Lennette Bihari, MD Follow up in 1 week(s).   Specialty: Family Medicine Why: has appt on 9/24 per family Contact information: [place] [address] [phone]          Allergies  Allergen Reactions  . Welchol [Colesevelam Hcl] Hives  . Glucosamine Other (See Comments)    Unknown  . Tramadol Nausea Only  . Celecoxib Hives, Itching and Rash  . Statins Rash      Procedures/Studies: Dg Chest 2 View  Result Date: 01/07/2019 CLINICAL DATA:  Generalized weakness for 1 month. Orthostatic hypotension. EXAM: CHEST - 2 VIEW COMPARISON:  12/24/2018 and older exams. FINDINGS: Cardiac silhouette is normal in size. No mediastinal or hilar masses. No evidence of adenopathy. Left lower lobe opacity obscures the posterior left hemidiaphragm, new since the prior chest radiograph, consistent pneumonia. Linear and reticular type opacities noted bilaterally most evident in the left mid to lower lung and in the right upper lobe, stable compared to the prior studies. No evidence of pulmonary edema. No pleural effusion or pneumothorax. Skeletal structures are intact. IMPRESSION: 1. Left lower lobe pneumonia. 2. Chronic changes both lungs, due to bronchiectasis bronchial wall thickening and mucous plugging, as noted on the CT dated 09/24/2016. Electronically Signed   By: Lajean Manes M.D.   On: 01/07/2019 14:36   Dg Chest 2 View  Result Date: 12/25/2018 CLINICAL DATA:  Bronchiectasis and LEFT rib pain, follow-up, diabetes mellitus, hypertension, COPD EXAM: CHEST - 2 VIEW COMPARISON:  06/01/2018 Correlation: CT chest 09/24/2016 FINDINGS: Normal heart size, mediastinal contours, and pulmonary vascularity. Calcified tortuous  thoracic aorta. Scattered areas of interstitial thickening and scarring in both lungs corresponding to bronchovascular infiltrates, cystic bronchiectasis, and scattered reticulonodular interstitial disease changes seen on prior CT. Resolved LEFT lung infiltrate since previous study. Small nodular focus RIGHT upper lobe 6 mm diameter unchanged. No acute infiltrate, pleural effusion or pneumothorax. Minimal biconvex thoracolumbar scoliosis and diffuse osseous demineralization. IMPRESSION: Scattered chronic lung changes bilaterally, stable. No acute abnormalities. Electronically Signed   By: Lavonia Dana M.D.   On: 12/25/2018 09:11   Ct Angio Chest Pe W Or Wo Contrast  Result Date: 01/08/2019 CLINICAL DATA:  Nonproductive cough and left pleuritic chest pain. Tachycardia, shortness of breath and hypoxemia. History of diabetes, COPD/bronchiectasis and lip cancer. EXAM: CT ANGIOGRAPHY CHEST WITH CONTRAST TECHNIQUE: Multidetector CT imaging of the chest was performed using the standard protocol during bolus administration of intravenous contrast. Multiplanar CT  image reconstructions and MIPs were obtained to evaluate the vascular anatomy. CONTRAST:  66mL OMNIPAQUE IOHEXOL 350 MG/ML SOLN COMPARISON:  Chest CT 09/24/2016. Nuclear medicine lung scan today. Chest radiographs yesterday. FINDINGS: Cardiovascular: The pulmonary arteries are well opacified with contrast to the level of the subsegmental branches. There is no evidence of acute pulmonary embolism. There is diffuse atherosclerosis of the aorta, great vessels and coronary arteries. No acute vascular findings. The heart size is normal. There is no pericardial effusion. Mediastinum/Nodes: There are no enlarged mediastinal, hilar or axillary lymph nodes. The thyroid gland, trachea and esophagus demonstrate no significant findings. Lungs/Pleura: There is no pleural effusion or pneumothorax. There is mild centrilobular emphysema. Extensive cylindrical and varicose  bronchiectasis is again noted, most advanced in the lingula. There is new dependent consolidation in the left lower lobe suspicious for pneumonia. There is mildly increased nodular airspace disease within the aerated portions of the left lower lobe. Additional peribronchovascular nodularity and areas of mucous plugging are similar to the previous study. Upper abdomen: The visualized upper abdomen appears stable without acute findings. Musculoskeletal/Chest wall: There is no chest wall mass or suspicious osseous finding. Review of the MIP images confirms the above findings. IMPRESSION: 1. No evidence of acute pulmonary embolism. 2. Left lower lobe consolidation consistent with pneumonia. Followup PA and lateral chest X-ray is recommended in 3-4 weeks following trial of antibiotic therapy to ensure resolution and exclude underlying malignancy. 3. 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 4. Coronary and Aortic Atherosclerosis (ICD10-I70.0). Electronically Signed   By: Richardean Sale M.D.   On: 01/08/2019 17:26   Nm Pulmonary Perf And Vent  Result Date: 01/08/2019 CLINICAL DATA:  Shortness of breath. Positive D-dimer study. History of carcinoma arising from lips EXAM: NUCLEAR MEDICINE VENTILATION - PERFUSION LUNG SCAN VIEWS: Anterior, posterior, left lateral, right lateral, RPO, LPO, RAO, LAO-ventilation and perfusion RADIOPHARMACEUTICALS:  29.0 mCi of Tc-42m DTPA aerosol inhalation and 1.5 mCi Tc108m MAA IV COMPARISON:  Chest radiograph January 07, 2019 FINDINGS: Ventilation: There is significant decreased ventilation throughout most of the left lower lobe at the site of pneumonia on recent chest radiograph. There is a ventilation defect in the anterior segment of the right lower lobe. Perfusion: There are matching perfusion defects throughout much of  the left lower lobe and in the anterior segment of the right lower lobe. No appreciable ventilation/perfusion mismatch evident. IMPRESSION: Matching ventilation and perfusion defects, larger on the left than on the right. No appreciable ventilation/perfusion mismatch. Note that there is a significant defect on the chest radiograph in the left lower lobe. This combination of findings places this study in an intermediate probability category for pulmonary embolus based on PIOPED II guidelines. Electronically Signed   By: Lowella Grip III M.D.   On: 01/08/2019 12:20       Subjective: Feels much better.  Cough improved.  Not in distress.  Discharge Exam: Vitals:   01/10/19 0539 01/10/19 0805  BP: (!) 162/76   Pulse: 87   Resp: 19   Temp: 98.9 F (37.2 C)   SpO2: 99% 98%   Vitals:   01/09/19 1943 01/09/19 2015 01/10/19 0539 01/10/19 0805  BP:  (!) 148/65 (!) 162/76   Pulse:  91 87   Resp:  19 19   Temp:  97.8 F (36.6 C) 98.9 F (37.2 C)   TempSrc:  Oral Oral   SpO2: 98% 98% 99%  98%  Weight:      Height:        General: Elderly pleasant female not in distress HEENT: Moist mucosa, supple neck Chest: Clear bilaterally CVs: Normal S1-S2 GI: Soft, nondistended, nontender Musculoskeletal: Warm, no edema     The results of significant diagnostics from this hospitalization (including imaging, microbiology, ancillary and laboratory) are listed below for reference.     Microbiology: Recent Results (from the past 240 hour(s))  Novel Coronavirus, NAA ([facility])     Status: None   Collection Time: 01/05/19 12:00 AM   Specimen: Oropharyngeal(OP) collection in vial transport medium   OROPHARYNGEA  TESTING  Result Value Ref Range Status   SARS-CoV-2, NAA Not Detected Not Detected Final    Comment: This nucleic acid amplification test was developed and its performance characteristics determined by [employer]. Nucleic acid amplification tests include PCR and TMA. This  test has not been FDA cleared or approved. This test has been authorized by FDA under an Emergency Use Authorization (EUA). This test is only authorized for the duration of time the declaration that circumstances exist justifying the authorization of the emergency use of in vitro diagnostic tests for detection of SARS-CoV-2 virus and/or diagnosis of COVID-19 infection under section 564(b)(1) of the Act, 21 U.S.C. [id number]) (1), unless the authorization is terminated or revoked sooner. When diagnostic testing is negative, the possibility of a false negative result should be considered in the context of a patient's recent exposures and the presence of clinical signs and symptoms consistent with COVID-19. An individual without symptoms of COVID-19 and who is not shedding SARS-CoV-2 virus would  expect to have a negative (not detected) result in this assay.   Urine culture     Status: Abnormal   Collection Time: 01/07/19  2:00 PM   Specimen: Urine, Clean Catch  Result Value Ref Range Status   Specimen Description   Final    URINE, CLEAN CATCH Performed at [facility], [address] [place], [place] [address]    Special Requests   Final    NONE Performed at [facility], [address] [place], [place] [address]    Culture 10,000 COLONIES/mL STAPHYLOCOCCUS WARNERI (A)  Final   Report Status 01/09/2019 FINAL  Final   Organism ID, Bacteria STAPHYLOCOCCUS WARNERI (A)  Final      Susceptibility   Staphylococcus warneri - MIC*    CIPROFLOXACIN >=8 RESISTANT Resistant     GENTAMICIN <=0.5 SENSITIVE Sensitive     NITROFURANTOIN 32 SENSITIVE Sensitive     OXACILLIN RESISTANT Resistant     TETRACYCLINE <=1 SENSITIVE Sensitive     VANCOMYCIN <=0.5 SENSITIVE Sensitive     TRIMETH/SULFA <=10 SENSITIVE Sensitive     CLINDAMYCIN RESISTANT Resistant     RIFAMPIN <=0.5 SENSITIVE Sensitive     Inducible Clindamycin POSITIVE Resistant     * 10,000 COLONIES/mL  STAPHYLOCOCCUS WARNERI  Culture, blood (routine x 2)     Status: None (Preliminary result)   Collection Time: 01/07/19  2:55 PM   Specimen: Right Antecubital; Blood  Result Value Ref Range Status   Specimen Description   Final    RIGHT ANTECUBITAL Performed at [facility], [address] [place], [place] [address]    Special Requests   Final    BOTTLES DRAWN AEROBIC AND ANAEROBIC Blood Culture adequate volume Performed at [facility], [address] [place], [place] [address]    Culture  Final    NO GROWTH 2 DAYS Performed at [facility], [place] [address], [place], [place] [address]    Report Status PENDING  Incomplete  Culture, blood (routine x 2)     Status: None (Preliminary result)   Collection Time: 01/07/19  3:00 PM   Specimen: BLOOD RIGHT HAND  Result Value Ref Range Status   Specimen Description   Final    BLOOD RIGHT HAND Performed at [facility], [address] [place], [place] [address]    Special Requests   Final    BOTTLES DRAWN AEROBIC AND ANAEROBIC Blood Culture results may not be optimal due to an inadequate volume of blood received in culture bottles Performed at [facility], [address] [place], [place] [address]    Culture   Final    NO GROWTH 2 DAYS Performed at [facility], [place] [address], [place], [place] [address]    Report Status PENDING  Incomplete  SARS CORONAVIRUS 2 (TAT 6-24 HRS) Nasopharyngeal Nasopharyngeal Swab     Status: None   Collection Time: 01/07/19 10:46 PM   Specimen: Nasopharyngeal Swab  Result Value Ref Range Status   SARS Coronavirus 2 NEGATIVE NEGATIVE Final    Comment: (NOTE) SARS-CoV-2 target nucleic acids are NOT DETECTED. The SARS-CoV-2 RNA is generally detectable in upper and lower respiratory specimens during the acute phase of infection. Negative results do not preclude SARS-CoV-2 infection, do not rule out co-infections with other pathogens, and should  not be used as the sole basis for treatment or other patient management decisions. Negative results must be combined with clinical observations, patient history, and epidemiological information. The expected result is Negative. Fact Sheet for Patients: SugarRoll.be Fact Sheet for Healthcare Providers: https://www.woods-mathews.com/ This test is not yet approved or cleared by the [place] FDA and  has been authorized for detection and/or diagnosis of SARS-CoV-2 by FDA under an Emergency Use Authorization (EUA). This EUA will remain  in effect (meaning this test can be used) for the duration of the COVID-19 declaration under Section 56 4(b)(1) of the Act, 21 U.S.C. section 360bbb-3(b)(1), unless the authorization is terminated or revoked sooner. Performed at [facility], [place] [address], [place], [place] [address]      Labs: BNP (last 3 results) No results for input(s): BNP in the last 8760 hours. Basic Metabolic Panel: Recent Labs  Lab 01/07/19 1409 01/08/19 0436 01/09/19 0453  NA 120* 131* 135  K 4.3 4.4 3.5  CL 86* 103 104  CO2 19* 17* 21*  GLUCOSE 172* 164* 237*  BUN 28* 23 22  CREATININE 1.55* 1.03* 0.86  CALCIUM 8.3* 7.4* 7.8*   Liver Function Tests: Recent Labs  Lab 01/07/19 1409  AST 16  ALT 14  ALKPHOS 82  BILITOT 0.6  PROT 7.0  ALBUMIN 3.1*   No results for input(s): LIPASE, AMYLASE in the last 168 hours. No results for input(s): AMMONIA in the last 168 hours. CBC: Recent Labs  Lab 01/07/19 1409 01/08/19 0422 01/09/19 0453  WBC 15.3* 9.0 9.1  NEUTROABS 12.1*  --   --   HGB 10.7* 8.5* 8.1*  HCT 33.1* 26.9* 25.4*  MCV 86.6 89.1 87.6  PLT 403* 318 354   Cardiac Enzymes: No results for input(s): CKTOTAL, CKMB, CKMBINDEX, TROPONINI in the last 168 hours. BNP: Invalid input(s): POCBNP CBG: Recent Labs  Lab 01/09/19 0724 01/09/19 1150 01/09/19 1551 01/09/19 2018 01/10/19 0736  GLUCAP 227*  163* 297* 231* 159*   D-Dimer Recent Labs    01/08/19 0120  DDIMER 1.19*   Hgb A1c No results for input(s): HGBA1C in the last 72 hours. Lipid Profile No results for input(s): CHOL, HDL, LDLCALC, TRIG, CHOLHDL, LDLDIRECT in the last 72 hours. Thyroid function studies Recent Labs    01/07/19 1409  TSH 2.852   Anemia work up No results for input(s): VITAMINB12, FOLATE, FERRITIN, TIBC, IRON, RETICCTPCT in the last 72 hours. Urinalysis    Component Value Date/Time   COLORURINE YELLOW 01/07/2019 1400   APPEARANCEUR CLEAR 01/07/2019 1400   LABSPEC 1.010 01/07/2019 1400   PHURINE 5.5 01/07/2019 1400   GLUCOSEU NEGATIVE 01/07/2019 1400   HGBUR NEGATIVE 01/07/2019 1400   BILIRUBINUR NEGATIVE 01/07/2019 1400   KETONESUR NEGATIVE 01/07/2019 1400   PROTEINUR NEGATIVE 01/07/2019 1400   UROBILINOGEN 0.2 12/19/2018 1155   NITRITE NEGATIVE 01/07/2019 1400   LEUKOCYTESUR SMALL (A) 01/07/2019 1400   Sepsis Labs Invalid input(s): PROCALCITONIN,  WBC,  LACTICIDVEN Microbiology Recent Results (from the past 240 hour(s))  Novel Coronavirus, NAA ([facility])     Status: None   Collection Time: 01/05/19 12:00 AM   Specimen: Oropharyngeal(OP) collection in vial transport medium   OROPHARYNGEA  TESTING  Result Value Ref Range Status   SARS-CoV-2, NAA Not Detected Not Detected Final    Comment: This nucleic acid amplification test was developed and its performance characteristics determined by [employer]. Nucleic acid amplification tests include PCR and TMA. This test has not been FDA cleared or approved. This test has been authorized by FDA under an Emergency Use Authorization (EUA). This test is only authorized for the duration of time the declaration that circumstances exist justifying the authorization of the emergency use of in vitro diagnostic tests for detection of SARS-CoV-2 virus and/or diagnosis of COVID-19 infection under section 564(b)(1) of the Act, 21  U.S.C. [id number]) (1), unless the authorization is terminated or revoked sooner. When diagnostic testing is negative, the possibility of a false negative result should be considered in the context of a patient's recent exposures and the presence of clinical signs and symptoms consistent with COVID-19. An individual without symptoms of COVID-19 and who is not shedding SARS-CoV-2 virus would  expect to have a negative (not detected) result in this assay.   Urine culture     Status: Abnormal   Collection Time: 01/07/19  2:00 PM   Specimen: Urine, Clean Catch  Result Value Ref Range Status   Specimen Description   Final    URINE, CLEAN CATCH Performed at [facility], [address] [place], [place] [address]    Special Requests   Final    NONE Performed at [facility], [address] [place], [place] [address]    Culture 10,000 COLONIES/mL STAPHYLOCOCCUS WARNERI (A)  Final   Report Status 01/09/2019 FINAL  Final   Organism ID, Bacteria STAPHYLOCOCCUS WARNERI (A)  Final      Susceptibility   Staphylococcus warneri - MIC*    CIPROFLOXACIN >=8 RESISTANT Resistant     GENTAMICIN <=0.5 SENSITIVE Sensitive     NITROFURANTOIN 32 SENSITIVE Sensitive     OXACILLIN RESISTANT Resistant     TETRACYCLINE <=1 SENSITIVE Sensitive     VANCOMYCIN <=0.5 SENSITIVE Sensitive     TRIMETH/SULFA <=10 SENSITIVE Sensitive     CLINDAMYCIN RESISTANT Resistant     RIFAMPIN <=0.5 SENSITIVE Sensitive     Inducible Clindamycin POSITIVE Resistant     * 10,000 COLONIES/mL  STAPHYLOCOCCUS WARNERI  Culture, blood (routine x 2)     Status: None (Preliminary result)   Collection Time: 01/07/19  2:55 PM   Specimen: Right Antecubital; Blood  Result Value Ref Range Status   Specimen Description   Final    RIGHT ANTECUBITAL Performed at [facility], [address] [place], [place] [address]    Special Requests   Final    BOTTLES DRAWN AEROBIC AND ANAEROBIC Blood Culture  adequate volume Performed at [facility], [address] [place], [place] [address]    Culture   Final    NO GROWTH 2 DAYS Performed at [facility], [place] [address], [place], [place] [address]    Report Status PENDING  Incomplete  Culture, blood (routine x 2)     Status: None (Preliminary result)   Collection Time: 01/07/19  3:00 PM   Specimen: BLOOD RIGHT HAND  Result Value Ref Range Status   Specimen Description   Final    BLOOD RIGHT HAND Performed at [facility], [address] [place], [place] [address]    Special Requests   Final    BOTTLES DRAWN AEROBIC AND ANAEROBIC Blood Culture results may not be optimal due to an inadequate volume of blood received in culture bottles Performed at [facility], [address] [place], [place] [address]    Culture   Final    NO GROWTH 2 DAYS Performed at [facility], [place] [address] [place], [place] [address]    Report Status PENDING  Incomplete  SARS CORONAVIRUS 2 (TAT 6-24 HRS) Nasopharyngeal Nasopharyngeal Swab     Status: None   Collection Time: 01/07/19 10:46 PM   Specimen: Nasopharyngeal Swab  Result Value Ref Range Status   SARS Coronavirus 2 NEGATIVE NEGATIVE Final    Comment: (NOTE) SARS-CoV-2 target nucleic acids are NOT DETECTED. The SARS-CoV-2 RNA is generally detectable in upper and lower respiratory specimens during the acute phase of infection. Negative results do not preclude SARS-CoV-2 infection, do not rule out co-infections with other pathogens, and should not be used as the sole basis for treatment or other patient management decisions. Negative results must be combined with clinical observations, patient history, and epidemiological information. The expected result is Negative. Fact Sheet for Patients: SugarRoll.be Fact Sheet for Healthcare Providers: https://www.woods-mathews.com/ This test is not yet approved or  cleared by the [place] FDA and  has been authorized for detection and/or diagnosis of SARS-CoV-2 by FDA under an Emergency Use Authorization (EUA). This EUA will remain  in effect (meaning this test can be used) for the duration of the COVID-19 declaration under Section 56 4(b)(1) of the Act, 21 U.S.C. section 360bbb-3(b)(1), unless the authorization is terminated or revoked sooner. Performed at [facility], [place] [address] [place], [place] [address]      Time coordinating discharge: 35 minutes  SIGNED:   Louellen Molder, MD  [facility] 01/10/2019, 10:10 AM Pager   If 7PM-7AM, please contact night-coverage [email or web address] Password [email or web address]

## 2019-01-10 NOTE — Progress Notes (Signed)
    Home health agencies that serve [id number].        Home Health Agencies Search Results  Results List Table  Home Health Agency Information Quality of Patient Care Rating Patient Survey Summary Rating  [facility] [phone] 4 out of 5 stars 4 out of 5 stars  [facility] [phone] 4  out of 5 stars 3 out of 5 stars  [facility] [phone] 4 out of 5 stars 4 out of 5 stars  [facility] [phone] 4 out of 5 stars 4 out of 5 stars  [facility] [phone] 3  out of 5 stars 4 out of 5 stars  [facility] [phone] 3 out of 5 stars 4 out of 5 stars  [facility] [phone] 4 out of 5 stars Not Available12  [facility] [phone] 3  out of 5 stars 3 out of 5 stars  [facility] [phone] 3  out of 5 stars 4 out of 5 stars  [facility] [phone] 3  out of 5 stars 3 out of 5 stars  [facility] [phone] 4  out of 5 stars 3 out of 5 stars  [facility] [phone] 4  out of 5 stars 2 out of 5 stars   Home Health Footnotes  Footnote number Footnote as displayed on Home Health Compare  1 This agency provides services under a federal waiver program to non-traditional, chronic long term population.  2 This agency provides services to a special needs population.  3 Not Available.  4 The number of patient episodes for this measure is too small to report.  5 This measure currently does not have data or provider has been certified/recertified for less than 6 months.  6 The national average for this measure is not provided because of state-to-state differences in data collection.  7 Medicare is not displaying rates for this measure for any home health agency, because of an issue with the data.  8 There were problems with the data and they are being corrected.  9 Zero, or very few,  patients met the survey's rules for inclusion. The scores shown, if any, reflect a very small number of surveys and may not accurately tell how an agency is doing.  10 Survey results are based on less than 12 months of data.  11 Fewer than 70 patients completed the survey. Use the scores shown, if any, with caution as the number of surveys may be too low to accurately tell how an agency is doing.  12 No survey results are available for this period.  13 Data suppressed by CMS for one or more quarters.    

## 2019-01-10 NOTE — Progress Notes (Signed)
Wheelchair order for home use:  Patient suffers from significant weakness for which she is unable to perform her activities of daily living in and around the house.  Patient has been using a wheelchair for several months.  A walking aid will not resolve the issues with performing activities of daily living. A wheelchair will allow patient to safely perform daily activities which impairs their ability to perform activities of daily living in and around the house. Patient is not able to propel themselves in the home using a standard weight wheelchair due to weakness.  Patient can self propel in the lightweight wheelchair.

## 2019-01-11 DIAGNOSIS — Z923 Personal history of irradiation: Secondary | ICD-10-CM | POA: Diagnosis not present

## 2019-01-11 DIAGNOSIS — R0789 Other chest pain: Secondary | ICD-10-CM | POA: Diagnosis not present

## 2019-01-11 DIAGNOSIS — Z85818 Personal history of malignant neoplasm of other sites of lip, oral cavity, and pharynx: Secondary | ICD-10-CM | POA: Diagnosis not present

## 2019-01-11 DIAGNOSIS — Z85828 Personal history of other malignant neoplasm of skin: Secondary | ICD-10-CM | POA: Diagnosis not present

## 2019-01-11 DIAGNOSIS — K219 Gastro-esophageal reflux disease without esophagitis: Secondary | ICD-10-CM | POA: Diagnosis not present

## 2019-01-11 DIAGNOSIS — J471 Bronchiectasis with (acute) exacerbation: Secondary | ICD-10-CM | POA: Diagnosis not present

## 2019-01-11 DIAGNOSIS — E860 Dehydration: Secondary | ICD-10-CM | POA: Diagnosis not present

## 2019-01-11 DIAGNOSIS — E1165 Type 2 diabetes mellitus with hyperglycemia: Secondary | ICD-10-CM | POA: Diagnosis not present

## 2019-01-11 DIAGNOSIS — J181 Lobar pneumonia, unspecified organism: Secondary | ICD-10-CM | POA: Diagnosis not present

## 2019-01-11 DIAGNOSIS — R42 Dizziness and giddiness: Secondary | ICD-10-CM | POA: Diagnosis not present

## 2019-01-11 DIAGNOSIS — J470 Bronchiectasis with acute lower respiratory infection: Secondary | ICD-10-CM | POA: Diagnosis not present

## 2019-01-11 DIAGNOSIS — Z7984 Long term (current) use of oral hypoglycemic drugs: Secondary | ICD-10-CM | POA: Diagnosis not present

## 2019-01-11 DIAGNOSIS — R531 Weakness: Secondary | ICD-10-CM | POA: Diagnosis not present

## 2019-01-11 DIAGNOSIS — M1990 Unspecified osteoarthritis, unspecified site: Secondary | ICD-10-CM | POA: Diagnosis not present

## 2019-01-11 DIAGNOSIS — I10 Essential (primary) hypertension: Secondary | ICD-10-CM | POA: Diagnosis not present

## 2019-01-11 LAB — LEGIONELLA PNEUMOPHILA SEROGP 1 UR AG: L. pneumophila Serogp 1 Ur Ag: NEGATIVE

## 2019-01-12 LAB — CULTURE, BLOOD (ROUTINE X 2)
Culture: NO GROWTH
Culture: NO GROWTH
Special Requests: ADEQUATE

## 2019-01-13 ENCOUNTER — Other Ambulatory Visit: Payer: Self-pay

## 2019-01-13 DIAGNOSIS — J181 Lobar pneumonia, unspecified organism: Secondary | ICD-10-CM

## 2019-01-14 ENCOUNTER — Other Ambulatory Visit: Payer: Self-pay

## 2019-01-14 DIAGNOSIS — I10 Essential (primary) hypertension: Secondary | ICD-10-CM | POA: Diagnosis not present

## 2019-01-14 DIAGNOSIS — E871 Hypo-osmolality and hyponatremia: Secondary | ICD-10-CM | POA: Diagnosis not present

## 2019-01-14 DIAGNOSIS — D649 Anemia, unspecified: Secondary | ICD-10-CM | POA: Diagnosis not present

## 2019-01-14 DIAGNOSIS — J181 Lobar pneumonia, unspecified organism: Secondary | ICD-10-CM | POA: Diagnosis not present

## 2019-01-14 DIAGNOSIS — E118 Type 2 diabetes mellitus with unspecified complications: Secondary | ICD-10-CM | POA: Diagnosis not present

## 2019-01-14 DIAGNOSIS — Z7984 Long term (current) use of oral hypoglycemic drugs: Secondary | ICD-10-CM | POA: Diagnosis not present

## 2019-01-14 NOTE — Patient Outreach (Signed)
[facility]) Care Management  01/14/2019  MAWADA DEWAARD 1926-01-01 [id number]  EMMI: general discharge red alert Referral date: 01/14/19 Referral reason: unfilled prescriptions Insurance:  Medicare Day # 1  Telephone call to patient regarding EMMI general discharge red alert. HIPAA verified with patient. RNCM explained reason for call.  Patient states she has a follow up appointment with her primary MD and she will take her medications with her for review.  Patient states she has transportation to her appointments.  Patient states she was in the hospital for pneumonia. She reports still having a cough and some pain in her left side. Patient states she is taking her medications as prescribed and uses her nebulizer 2 times per day. Patient states she took her last antibiotic medication today. She reports she is receiving nursing and therapy services with home health.  Patient states her blood sugars have been high during this time she has been sick. She reports yesterday's PM blood sugar was 226.  She states her fasting blood sugars have been between 165 and 185. Patient states her blood sugar has gone up to 400.  Patient states she has recently completed prednisone dose.  Patient states her normal fasting is 140's. RNCM advised patient to discuss her elevated blood sugars at doctors appointment today. Patient verbalized understanding.  RNCM advised patient to notify MD of any changes in condition prior to scheduled appointment. RNCM provided contact name and number: [phone] or main office number [phone] and 24 hour nurse advise line [phone] by mail.  RNCM verified patient aware of 911 services for urgent/ emergent needs.  PLAN;  RNCM will follow up with patient within 1 week.  RNCM will send brochure/ magnet.   Quinn Plowman RN,BSN,CCM [facility] Telephonic  [phone]        PLAN:  RNCM will

## 2019-01-18 ENCOUNTER — Telehealth: Payer: Self-pay | Admitting: Internal Medicine

## 2019-01-18 NOTE — Telephone Encounter (Signed)
Call returned to patient, she reports she was recently seen in the ED and they stopped her perforomist. She wanted to see if she needed to continue the perforomist. I made her aware we need to make her a visit for a hospital f/u and we could address that during the visit. Tele-visit, appt made. Nothing further needed at this time. Aware this is a visit via telephone.

## 2019-01-19 ENCOUNTER — Ambulatory Visit (INDEPENDENT_AMBULATORY_CARE_PROVIDER_SITE_OTHER): Payer: Medicare Other | Admitting: Adult Health

## 2019-01-19 ENCOUNTER — Encounter: Payer: Self-pay | Admitting: Adult Health

## 2019-01-19 ENCOUNTER — Other Ambulatory Visit: Payer: Self-pay

## 2019-01-19 ENCOUNTER — Telehealth: Payer: Self-pay | Admitting: Internal Medicine

## 2019-01-19 DIAGNOSIS — J479 Bronchiectasis, uncomplicated: Secondary | ICD-10-CM

## 2019-01-19 DIAGNOSIS — J181 Lobar pneumonia, unspecified organism: Secondary | ICD-10-CM | POA: Diagnosis not present

## 2019-01-19 DIAGNOSIS — J189 Pneumonia, unspecified organism: Secondary | ICD-10-CM

## 2019-01-19 DIAGNOSIS — J470 Bronchiectasis with acute lower respiratory infection: Secondary | ICD-10-CM | POA: Diagnosis not present

## 2019-01-19 DIAGNOSIS — J471 Bronchiectasis with (acute) exacerbation: Secondary | ICD-10-CM | POA: Diagnosis not present

## 2019-01-19 DIAGNOSIS — R531 Weakness: Secondary | ICD-10-CM | POA: Diagnosis not present

## 2019-01-19 DIAGNOSIS — E1165 Type 2 diabetes mellitus with hyperglycemia: Secondary | ICD-10-CM | POA: Diagnosis not present

## 2019-01-19 DIAGNOSIS — I10 Essential (primary) hypertension: Secondary | ICD-10-CM | POA: Diagnosis not present

## 2019-01-19 MED ORDER — PERFOROMIST 20 MCG/2ML IN NEBU: 20.0000 ug | INHALATION_SOLUTION | Freq: Two times a day (BID) | RESPIRATORY_TRACT | 5 refills | Status: DC

## 2019-01-19 NOTE — Patient Instructions (Addendum)
Mucinex DM Twice daily  As needed  Cough/congestion  Flutter valve As needed   Restart Budesonide Neb daily  Restart Ipratropium Neb Twice daily   Will send Perforomist Neb Twice daily  (to DME company to see if more affordable on your Medicare B plan )  Activity as tolerated.  Follow up with Dr. Annamaria Boots  Or Miguelangel Korn NP in 4 weeks with Chest xray  Please contact office for sooner follow up if symptoms do not improve or worsen or seek emergency care

## 2019-01-19 NOTE — Telephone Encounter (Signed)
Patient had televisit with TP this morning 9.29.2020 and was recommended to follow up in 4 weeks w/ cxr:  Instructions Mucinex DM Twice daily  As needed  Cough/congestion  Flutter valve As needed   Restart Budesonide Neb daily  Restart Ipratropium Neb Twice daily   Will send Perforomist Neb Twice daily  (to DME company to see if more affordable on your Medicare B plan )  Activity as tolerated.  Follow up with Dr. Annamaria Boots  Or Parrett NP in 4 weeks with Chest xray  Please contact office for sooner follow up if symptoms do not improve or worsen or seek emergency care      Called spoke with patient to schedule appt.  Patient expressed concern at seeing anyone other than Dr. Annamaria Boots.  Also, patient mentioned that PCP Dr Hulan Fess mentioned to her at last visit about sending her to [facility] for ID referral for her bronchiectasis/MAIC.  Patient would like to know Dr Janee Morn thoughts on this.  Dr. Annamaria Boots please advise if patient may be added to your schedule around 10/26 - 10/30 and the ID referral.  Thank you.

## 2019-01-19 NOTE — Patient Outreach (Signed)
[facility]) Care Management  01/19/2019  MILLIE GLENDENNING 08-Oct-1925 [id number] EMMI: general discharge red alert Referral date: 01/14/19 Referral reason: unfilled prescriptions Insurance:  Medicare Day # 1  Telephone call to patient regarding EMMI general discharge red alert. HIPAA verified. Patient states her granddaughter and Assunta Found are on the phone with her. Patient gave verbal permission to speak with her granddaughter and Assunta Found regarding her medical information.    Patient states she saw her primary MD last week.  She states her doctor prescribed her another oral diabetic medication to take if her blood sugars range over 200.  Patient states her fasting blood sugar on yesterday was 136 and today 163.  Patient states she has not had to take the medication because her blood sugars have been returning to their normal range. Patient states she is still feeling a little weak but getting stronger. [facility] reports [facility] to provide her home physical [facility]. Patient states she has been walking as much as possible.  Patient denies any further needs or concerns. RNCM discussed [facility] care management services. Offered patient ongoing follow up calls with nurse. Patient declined.  Patient states, " I don't need that."   RNCM advised patient to notify MD of any changes in condition prior to scheduled appointment. RNCM provided contact name and number: [phone] or main office number [phone] and 24 hour nurse advise line [phone].  RNCM verified patient aware of 911 services for urgent/ emergent needs.   PLAN: RNCM will close patient due to patient refusing services.  RNCM will send closure notification to patients primary MD.  [facility] will send patient [facility] care management brochure/ magnet.   Quinn Plowman RN,BSN,CCM [facility] Telephonic  [phone]

## 2019-01-19 NOTE — Progress Notes (Signed)
Virtual Visit via Telephone Note  I connected with Katie Alvarado on 01/19/19 at 11:00 AM EDT by telephone and verified that I am speaking with the correct person using two identifiers.  Location: Patient: Home  Provider: Office    I discussed the limitations, risks, security and privacy concerns of performing an evaluation and management service by telephone and the availability of in person appointments. I also discussed with the patient that there may be a patient responsible charge related to this service. The patient expressed understanding and agreed to proceed.   History of Present Illness: 85 year old female never smoker followed for bronchiectasis, chronic bronchitis, MAC Medical history significant for GERD, diabetes, arthritis, aortic atherosclerosis Lip cancer status post radiation  Today's tele-visit is a hospital follow-up.  She is accompanied by her several family members.   Patient was hospitalized 2 weeks ago for a left lower lobe pneumonia and hyponatremia.  She was treated with IV antibiotics and discharged on Levaquin for a total of 7-day course.  Her d-dimer was elevated VQ scan was indeterminate for PE.  CT angio chest was negative for PE.  Hyponatremia was felt to be a combination of dehydration and SI ADH.  This improved with fluids. She was also anemia. Seen by PCP last week with labs, results pending . Prior to admission says she was taking Trelegy daily . Budesonide daily and Ipratopium Twice daily  . At discharge says Trelegy and Budesonide was stopped. Started on Perforomist but could not afford at pharmacy .  Since discharge patient says she is feeling better she still remains weak with low energy but says that each day she feels like she is gotten a little bit stronger.  She has no increased cough currently.  No increased congestion.  Appetite is starting to pick back up.  She denies any chest pain orthopnea PND or increased leg  swelling.    Observations/Objective: Sputum Cx 06/09/12 POS MAIC  Started 07/14/12- Biaxin 512m TIW, EMB 1200 TIW, Rifabutin 300 TIW  ended- 08/12/12 due to weakness and malaise,  CT chest 06/01/12- Progressive bilateral nodular bronchiectasis Office spirometry  01/18/13- moderate obstructive airways disease-FVC 2.10/69%, FEV1 1.25/58%,  FEV1/FVC  0.59/ 84%,  FEF 25-75% 0.47/33%  Barium swallow 11/17/2018- Esophagram is normal for age. No aspiration of barium occurred.  CT Chest Angio 12/2018 > No evidence of acute pulmonary embolism. 2. Left lower lobe consolidation consistent with pneumonia. Followup PA and lateral chest X-ray is recommended in 3-4 weeks following trial of antibiotic therapy to ensure resolution and exclude underlying malignancy. 3. 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   Assessment and Plan: -left lower lobe pneumonia-clinically appears to be improving.  She is completed a full course of antibiotics.  Will check chest x-ray in 4 weeks.  Bronchiectasis//chronic bronchitis-patient seems to have some polypharmacy with multiple nebulizers and inhalers.  For now will restart budesonide daily.  And restart ipratropium nebulizer twice daily.  As she reports these were affordable.  We will hold on Trelegy as this is duplicate for now.  I will send Perforomist nebulizer to the DME company to see if this is more affordable through her insurance if not we will hold on Perforomist going forward.  Plan  Patient Instructions  Mucinex DM Twice daily  As needed  Cough/congestion  Flutter valve As needed   Restart Budesonide Neb daily  Restart Ipratropium Neb Twice daily  Will send Perforomist Neb Twice daily  (to DME company to see if more affordable on your Medicare B plan )  Activity as tolerated.  Follow up  with Dr. Annamaria Alvarado  Or Katie Nickolson NP in 4 weeks with Chest xray  Please contact office for sooner follow up if symptoms do not improve or worsen or seek emergency care       Follow Up Instructions: Follow-up with Dr. Annamaria Alvarado in 4 weeks with chest x-ray and as needed  Please contact office for sooner follow up if symptoms do not improve or worsen or seek emergency care     I discussed the assessment and treatment plan with the patient. The patient was provided an opportunity to ask questions and all were answered. The patient agreed with the plan and demonstrated an understanding of the instructions.   The patient was advised to call back or seek an in-person evaluation if the symptoms worsen or if the condition fails to improve as anticipated.  I provided 24  minutes of non-face-to-face time during this encounter.   Katie Edison, NP

## 2019-01-20 DIAGNOSIS — D649 Anemia, unspecified: Secondary | ICD-10-CM | POA: Diagnosis not present

## 2019-01-20 NOTE — Telephone Encounter (Signed)
Thank you Dr. Solon Augusta spoke with patient, advised of CDY's response 4 week follow up scheduled for 10.27.2020 @ 1130 Patient will inform her PCP if she is okay with ID referral at [facility]  Nothing further needed; will sign off

## 2019-01-20 NOTE — Telephone Encounter (Signed)
Yes fine to add her to my schedule as you suggest. Fine for PCP to refer to ID at [facility], if she would like to see if they can help.

## 2019-01-28 ENCOUNTER — Other Ambulatory Visit: Payer: Self-pay | Admitting: *Deleted

## 2019-01-28 NOTE — Patient Outreach (Signed)
[facility]) Care Management  01/28/2019  JAMANI CRANE 1925-09-01 [id number]    Telephone Assessment-Unsuccessful  RN attempted outreach today however unsuccessful. RN able to leave a HIPAA approved voice message requesting a call back.  Will attempt another outreach over the next week.  [name], RN Care Management Coordinator [place] Office [phone]

## 2019-02-02 DIAGNOSIS — J471 Bronchiectasis with (acute) exacerbation: Secondary | ICD-10-CM | POA: Diagnosis not present

## 2019-02-02 DIAGNOSIS — R531 Weakness: Secondary | ICD-10-CM | POA: Diagnosis not present

## 2019-02-02 DIAGNOSIS — J470 Bronchiectasis with acute lower respiratory infection: Secondary | ICD-10-CM | POA: Diagnosis not present

## 2019-02-02 DIAGNOSIS — I10 Essential (primary) hypertension: Secondary | ICD-10-CM | POA: Diagnosis not present

## 2019-02-02 DIAGNOSIS — J181 Lobar pneumonia, unspecified organism: Secondary | ICD-10-CM | POA: Diagnosis not present

## 2019-02-02 DIAGNOSIS — E1165 Type 2 diabetes mellitus with hyperglycemia: Secondary | ICD-10-CM | POA: Diagnosis not present

## 2019-02-03 ENCOUNTER — Ambulatory Visit: Payer: Self-pay | Admitting: *Deleted

## 2019-02-04 ENCOUNTER — Other Ambulatory Visit: Payer: Self-pay | Admitting: *Deleted

## 2019-02-04 NOTE — Patient Outreach (Signed)
[facility]) Care Management  02/04/2019  Katie Alvarado 10-30-1925 [id number]   Telephone Assessment-Successful  RN spoke with the daughter Katie Alvarado) who states she is now staying with the pt and she is doing a lot better. States th ept is very independent and able to dressing and ambulate without assistance. RN explained the purpose for today's call as the pt was not available. The caregiver provided information on the pt regarding the upcoming appointments with her primary and pulmonology providers later in this month. States remains involved however pt feels she does not need this services and as requested to discontinue. Last day for the visiting RN will be on Monday. States they were only taking vitals and now that she is caring for the pt this is something that can be done in the home. States all vitals are stable however earlier pt was taking Prednisone and her CBGs were elevated. Now that pt has completed this regimen her CBG are around 150 and better controlled with no additional high readings. Pt does not take insulin but continues to take her oral Metformin to control her diabetes. Pretty health diet with no issues. Very support family to transport pt to and from all medical appointments.  Further discussed pt's COPD as daughter states pt uses her inhaler and nebulizer as prescribed with no major issues at this time. RN introduce other available services with [place] and social worker if community resources are needed. Caregiver aware all [facility] services are provided with no fees or charges (with understanding). Discussed entering the program with pt's provider aware of her participation. Offered to follow up quarterly if not monthly as pt continue to prevent with her strength managing her diabetes and COPD. Prevention measures discussed and RN offered such services based upon pt's awareness to participate. Daughter states she does not believe pt is in need of such service  but requested a call back in 1-2 weeks until she is able to talk with the pt about [facility] services. Will follow up in a few weeks to confirm pt's participation and enrollment into the [facility] program. Will attempt to focus on program management source at that time if pt has other concerns or request at that time.  Katie Mina, RN Care Management Coordinator [place] Office [phone]

## 2019-02-08 ENCOUNTER — Other Ambulatory Visit: Payer: Self-pay | Admitting: *Deleted

## 2019-02-08 DIAGNOSIS — R531 Weakness: Secondary | ICD-10-CM | POA: Diagnosis not present

## 2019-02-08 DIAGNOSIS — J470 Bronchiectasis with acute lower respiratory infection: Secondary | ICD-10-CM | POA: Diagnosis not present

## 2019-02-08 DIAGNOSIS — J181 Lobar pneumonia, unspecified organism: Secondary | ICD-10-CM | POA: Diagnosis not present

## 2019-02-08 DIAGNOSIS — J471 Bronchiectasis with (acute) exacerbation: Secondary | ICD-10-CM | POA: Diagnosis not present

## 2019-02-08 DIAGNOSIS — E1165 Type 2 diabetes mellitus with hyperglycemia: Secondary | ICD-10-CM | POA: Diagnosis not present

## 2019-02-08 DIAGNOSIS — I10 Essential (primary) hypertension: Secondary | ICD-10-CM | POA: Diagnosis not present

## 2019-02-08 NOTE — Patient Outreach (Addendum)
[facility]) Care Management  02/08/2019  Katie Alvarado [birth year] [id number]    Telephone Assessment-Successful (Declined at this time however receptive to another outreach in a few weeks)  RN spoke with pt today more in detail concerning the [facility] program and available services. PR expressed the available services and benefits of [facility] services. RN verified no additional issues related to pass GENERAL DISCHARGE EMMIs 9/24 with no additional coughing or related symptoms, CBGs after finishing Pedisone 10/18 137 and 10/19 138 AM and no additional bleeding to her ear (ED visit 10/2). Pt reports upcoming appointments with her providers (Dr. Rex Kras and Dr. Annamaria Boots) later this month. Reports she has declined [facility] with no needs for this services. Pt receptive to some of the discussion today however declined the initial assessment and prefers not to enroll officially into the [facility] program and services. RN offered to follow up quarterly, month or as her request for telephonic calls. Pt states she is very private and does not wish to get calls at this time. RN offered to speak again to her daughter if additional information will help her decide on [facility] services if she does not have a clear understanding. Pt opt to decline and states she again does not want services at this time. RN offered to follow up in a few weeks to allow pt to think about [facility] services. Pt receptive to a follow up call over the next month. RN offered to call on 11/6 to inquire on services once again (receptive).   PLAN:  RN will schedule another outreach call in 3 weeks and inquired further on possible services. Pt is aware [facility] communicates with pt's primary provider (Dr. Rex Kras).  [name], RN Care Management Coordinator [place] Office [phone]

## 2019-02-11 DIAGNOSIS — E118 Type 2 diabetes mellitus with unspecified complications: Secondary | ICD-10-CM | POA: Diagnosis not present

## 2019-02-11 DIAGNOSIS — E871 Hypo-osmolality and hyponatremia: Secondary | ICD-10-CM | POA: Diagnosis not present

## 2019-02-11 DIAGNOSIS — I10 Essential (primary) hypertension: Secondary | ICD-10-CM | POA: Diagnosis not present

## 2019-02-11 DIAGNOSIS — D649 Anemia, unspecified: Secondary | ICD-10-CM | POA: Diagnosis not present

## 2019-02-11 DIAGNOSIS — N898 Other specified noninflammatory disorders of vagina: Secondary | ICD-10-CM | POA: Diagnosis not present

## 2019-02-11 DIAGNOSIS — Z8701 Personal history of pneumonia (recurrent): Secondary | ICD-10-CM | POA: Diagnosis not present

## 2019-02-16 ENCOUNTER — Ambulatory Visit (INDEPENDENT_AMBULATORY_CARE_PROVIDER_SITE_OTHER): Payer: Medicare Other | Admitting: Internal Medicine

## 2019-02-16 ENCOUNTER — Encounter: Payer: Self-pay | Admitting: Internal Medicine

## 2019-02-16 ENCOUNTER — Other Ambulatory Visit: Payer: Self-pay

## 2019-02-16 VITALS — BP 118/64 | HR 84 | Temp 97.5°F | Ht 68.0 in | Wt 136.8 lb

## 2019-02-16 DIAGNOSIS — E871 Hypo-osmolality and hyponatremia: Secondary | ICD-10-CM

## 2019-02-16 DIAGNOSIS — Z23 Encounter for immunization: Secondary | ICD-10-CM | POA: Diagnosis not present

## 2019-02-16 DIAGNOSIS — J189 Pneumonia, unspecified organism: Secondary | ICD-10-CM | POA: Diagnosis not present

## 2019-02-16 DIAGNOSIS — M545 Low back pain: Secondary | ICD-10-CM

## 2019-02-16 DIAGNOSIS — M79606 Pain in leg, unspecified: Secondary | ICD-10-CM

## 2019-02-16 NOTE — Progress Notes (Signed)
HPI F never smoker, followed for bronchiectasis/ chronic bronchitis/ MAIC, complicated by hx of GERD, DM , arthritis, aortic atherosclerosis Sputum Cx 06/09/12 POS MAIC  Started 07/14/12- Biaxin 500mg  TIW, EMB 1200 TIW, Rifabutin 300 TIW  ended- 08/12/12 due to weakness and malaise,  CT chest 06/01/12- Progressive bilateral nodular bronchiectasis Office spirometry  01/18/13- moderate obstructive airways disease-FVC 2.10/69%, FEV1 1.25/58%,  FEV1/FVC  0.59/ 84%,  FEF 25-75% 0.47/33% --------------------------------------------------------------------------------  12/24/2018- 85 year old female never smoker followed for bronchiectasis/chronic bronchitis/MAIC, complicated by history GERD, DM, arthritis,hyponatremia, aortic atherosclerosis, Lumbar DDD/ backpain -----pt states her breathing is at baseline; however, reports when she breathes deep, she feels pain in her left lung area and left arm She feels weak and has lost weight- about 10 lbs since January. Denies fever, bleed or change in cough. Still trying to live here alone, rather than to move in with daughter in [place]. Continues Trelegy. About 3 weeks ago she leaned over and fell against bedside commode, hurting Left lat ribs. Persistent focal pleuritic L mid axillary line pain since then. Painful L shoulder managed with steroid taper and inj by Dr Gladstone Lighter- inoperable due to her age/ health.  Barium swallow 11/17/2018- Esophagram is normal for age.  No aspiration of barium occurred. CXR 12/24/2018-  IMPRESSION: Scattered chronic lung changes bilaterally, stable. No acute abnormalities. Labs 12/24/2018-  Glu 165, Na 128, BUN 34, Creat 0.89                            WBC 11, 800 (on steroids), Hgb 11.3, PLATELETS 636 K  02/16/2019-  85 year old female never smoker followed for bronchiectasis/chronic bronchitis/MAIC, complicated by history GERD, DM, arthritis,hyponatremia, aortic atherosclerosis, Lumbar DDD/ back pain Hosp w LLL pneumonia and  hyponatremia shortly after I saw her last (CXR w/o pneumonia on 9/3 when I saw her), then had tele f/u by NP in Sept. -----Patient reports that she is sob with exertion and weak. She reports a productive cough with clear sputum.  Proair hfa, neb Perforomist Dr Little's office has done f/u labs and CXR, telling her pneumonia resolved.   Denies fever, blood, adenopathy.  Doesn't want to live with daughter or move to assisted living. Trying to hire helpers. CTa chest 9/182020 IMPRESSION: 1. No evidence of acute pulmonary embolism. 2. Left lower lobe consolidation consistent with pneumonia. Followup PA and lateral chest X-ray is recommended in 3-4 weeks following trial of antibiotic therapy to ensure resolution and exclude underlying malignancy. 3. 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 4. Coronary and Aortic Atherosclerosis (ICD10-I70.0).   ROS-see HPI + = positive Constitutional:     +weight loss, night sweats, no-fevers, chills,  +fatigue, lassitude. HEENT:   No-  headaches, difficulty swallowing, +tooth/dental problems, no-sore throat,       No-  sneezing, itching, ear ache, +nasal congestion, post nasal drip, + declining eyesight CV:  + chest pain, orthopnea, PND, swelling in lower extremities, anasarca,  dizziness, palpitations Resp: +Mild  shortness of breath with exertion or at rest.             +productive cough,  + non-productive cough,  No- coughing up of blood.              No-   change in color of mucus.  No- wheezing.   Skin: No-   rash or lesions. GI:  No-   heartburn,  indigestion, abdominal pain, nausea, vomiting,  GU:  MS:  +Active low back and bilateral hip pain. L rib and L shoulder pain Neuro-     complaint of generalized weakness without myalgias Psych:  No- change in mood or affect. No depression or  anxiety.  No memory loss.  Obj- Physical exam General- Alert, Oriented, Frail, NAD, gracious woman, trying to be stoic Skin- rash-none,  excoriation- none Lymphadenopathy- none Head- atraumatic            Eyes- + decreased vision            Ears- Hearing, canals-normal            Nose- Clear, no-Septal dev, mucus, polyps, erosion, perforation             Throat- Mallampati II , mucosa clear , drainage- none, tonsils- atrophic Neck- flexible , trachea midline, no stridor , thyroid nl, carotid no bruit Chest - symmetrical excursion , unlabored           Heart/CV- RRR , no murmur , no gallop  , no rub, nl s1 s2                           - JVD- none , edema- none, stasis changes- none, varices- none           Lung-   Cough+ mild while here, + light scattered crackles, unlabored, no wheeze,  dullness-none, rub-none. + no crepitus Chest wall- + Point tender L mid axillary line w/ o rub Abd-  Br/ Gen/ Rectal- Not done, not indicated Extrem-   No-clubbing, none, atrophy- none, strength- nl for age. + Cool/dusky feet Neuro- grossly intact to observation

## 2019-02-16 NOTE — Assessment & Plan Note (Signed)
Tracked and managed by PCP. Admit to drinking " a lot of water". This may contribute to her weakness. Plan- drink only for thirst. Ok to salt food.

## 2019-02-16 NOTE — Assessment & Plan Note (Addendum)
Resolved by her report of [facility] CXR done recently. LLL is are of chronic scarring/ bronchiectasis. Have repeatedly explored possibility of aspitration with her. She asked about pneumonia vax. After discussion, agreed to update Pneumovax-23.

## 2019-02-16 NOTE — Assessment & Plan Note (Signed)
She is planning return to Ortho for steroid inj. Very limiting, adding to debilitation.

## 2019-02-16 NOTE — Patient Instructions (Addendum)
Follow Dr Eddie Dibbles advice about managing your sodium level  Order- Pneumovax-23 pneumonia vaccine  Please call if we can help

## 2019-02-18 DIAGNOSIS — M7061 Trochanteric bursitis, right hip: Secondary | ICD-10-CM | POA: Diagnosis not present

## 2019-02-18 DIAGNOSIS — M25571 Pain in right ankle and joints of right foot: Secondary | ICD-10-CM | POA: Diagnosis not present

## 2019-02-18 DIAGNOSIS — M25572 Pain in left ankle and joints of left foot: Secondary | ICD-10-CM | POA: Diagnosis not present

## 2019-02-22 ENCOUNTER — Other Ambulatory Visit: Payer: Self-pay | Admitting: Internal Medicine

## 2019-02-26 ENCOUNTER — Other Ambulatory Visit: Payer: Self-pay | Admitting: *Deleted

## 2019-02-26 NOTE — Patient Outreach (Signed)
Katie Alvarado [facility]) Care Management  02/26/2019  [name] [birth year] [id number]    Telephone Assessment-Decline [facility]  RN spoke with pt and verified identifiers. RN reintroduced [name] and again the purpose for today's call. Pt states she has not spoken with her daughter concerning [facility] services but opt to decline. RN further inquired on her recent EMMI in Sept and hospital in Oct. Pt confirm all those issues have been resolved with no additional needs. Reports her CBG were increased due to the medication Prednisone but her provider recent changed the medication for her "hip shots" with no affects on her CBG readings. States this morning it was 130 ( back to her norm). Other issues discussed related to pt's poor vision and pt has inquired on any resources. RN discussed Armed forces training and education officer of the [employer], [employer] [place] and [employer] [phone]). RN offered contact numbers along with a referral via [place] worker for additional resources. Pt declined indicating she has already spoken with [employer] who brought out magazines to sell products to assist. Due to pt's vision inquired if her daughter [name] and other caregiver mentioned call contact the resources and inquire further on availability. RN also encouraged pt to inquire further on her needs with her eye provider ([name]).  RN discussed her risk with the above ongoing issues and offered to assist with enrollment into the program and services. Offered to follow up quarterly if not month concerning her needs and use of [facility] services for managing her diabetes. Again pt adamantly decline however grateful but does not wish to enroll into the program and services.  RN strongly encouraged pt to contact this RN case manager with any concerns or assistance needed for resources in the future. RN discuss prevention measures with appropriate DME. Pt states she will visit the supply store soon for her needs.  No other issues or needs  presented at this time. Case will be closed based upon pt's directly respond to not enroll into the [facility] program and services. RN recite past [facility] co-workers who have worked with her in the past and explained to pt that she may received another outreach for services in the future due to her ongoing long history with [facility]. Again pt very grateful for the call but has declined the [facility] services.  [name], RN Care Management Coordinator [place] [facility] [phone]

## 2019-03-01 DIAGNOSIS — M25551 Pain in right hip: Secondary | ICD-10-CM | POA: Diagnosis not present

## 2019-03-02 DIAGNOSIS — H353114 Nonexudative age-related macular degeneration, right eye, advanced atrophic with subfoveal involvement: Secondary | ICD-10-CM | POA: Diagnosis not present

## 2019-03-02 DIAGNOSIS — H35423 Microcystoid degeneration of retina, bilateral: Secondary | ICD-10-CM | POA: Diagnosis not present

## 2019-03-02 DIAGNOSIS — H35433 Paving stone degeneration of retina, bilateral: Secondary | ICD-10-CM | POA: Diagnosis not present

## 2019-03-02 DIAGNOSIS — H353221 Exudative age-related macular degeneration, left eye, with active choroidal neovascularization: Secondary | ICD-10-CM | POA: Diagnosis not present

## 2019-03-03 ENCOUNTER — Other Ambulatory Visit: Payer: Self-pay

## 2019-03-03 DIAGNOSIS — Z20828 Contact with and (suspected) exposure to other viral communicable diseases: Secondary | ICD-10-CM | POA: Diagnosis not present

## 2019-03-03 DIAGNOSIS — Z20822 Contact with and (suspected) exposure to covid-19: Secondary | ICD-10-CM

## 2019-03-05 LAB — NOVEL CORONAVIRUS, NAA: SARS-CoV-2, NAA: NOT DETECTED

## 2019-03-12 DIAGNOSIS — M25551 Pain in right hip: Secondary | ICD-10-CM | POA: Diagnosis not present

## 2019-03-22 DIAGNOSIS — M25551 Pain in right hip: Secondary | ICD-10-CM | POA: Diagnosis not present

## 2019-03-29 DIAGNOSIS — R350 Frequency of micturition: Secondary | ICD-10-CM | POA: Diagnosis not present

## 2019-03-29 DIAGNOSIS — R300 Dysuria: Secondary | ICD-10-CM | POA: Diagnosis not present

## 2019-03-29 DIAGNOSIS — R3915 Urgency of urination: Secondary | ICD-10-CM | POA: Diagnosis not present

## 2019-03-31 ENCOUNTER — Emergency Department (HOSPITAL_BASED_OUTPATIENT_CLINIC_OR_DEPARTMENT_OTHER)
Admission: EM | Admit: 2019-03-31 | Discharge: 2019-03-31 | Disposition: A | Discharge status: Home / Self Care | Payer: Medicare Other | Attending: Emergency Medicine | Admitting: Emergency Medicine

## 2019-03-31 ENCOUNTER — Other Ambulatory Visit: Payer: Self-pay

## 2019-03-31 ENCOUNTER — Encounter (HOSPITAL_BASED_OUTPATIENT_CLINIC_OR_DEPARTMENT_OTHER): Payer: Self-pay

## 2019-03-31 ENCOUNTER — Emergency Department (HOSPITAL_BASED_OUTPATIENT_CLINIC_OR_DEPARTMENT_OTHER): Payer: Medicare Other

## 2019-03-31 DIAGNOSIS — R42 Dizziness and giddiness: Secondary | ICD-10-CM | POA: Diagnosis not present

## 2019-03-31 DIAGNOSIS — Z888 Allergy status to other drugs, medicaments and biological substances status: Secondary | ICD-10-CM | POA: Diagnosis not present

## 2019-03-31 DIAGNOSIS — E119 Type 2 diabetes mellitus without complications: Secondary | ICD-10-CM | POA: Diagnosis not present

## 2019-03-31 DIAGNOSIS — Z79899 Other long term (current) drug therapy: Secondary | ICD-10-CM | POA: Diagnosis not present

## 2019-03-31 DIAGNOSIS — R55 Syncope and collapse: Secondary | ICD-10-CM | POA: Diagnosis present

## 2019-03-31 DIAGNOSIS — J449 Chronic obstructive pulmonary disease, unspecified: Secondary | ICD-10-CM | POA: Insufficient documentation

## 2019-03-31 DIAGNOSIS — I10 Essential (primary) hypertension: Secondary | ICD-10-CM | POA: Insufficient documentation

## 2019-03-31 DIAGNOSIS — Z7984 Long term (current) use of oral hypoglycemic drugs: Secondary | ICD-10-CM | POA: Insufficient documentation

## 2019-03-31 LAB — URINALYSIS, MICROSCOPIC (REFLEX)

## 2019-03-31 LAB — URINALYSIS, ROUTINE W REFLEX MICROSCOPIC
Bilirubin Urine: NEGATIVE
Glucose, UA: NEGATIVE mg/dL
Hgb urine dipstick: NEGATIVE
Ketones, ur: NEGATIVE mg/dL
Leukocytes,Ua: NEGATIVE
Nitrite: NEGATIVE
Protein, ur: 100 mg/dL — AB
Specific Gravity, Urine: 1.015 (ref 1.005–1.030)
pH: 7 (ref 5.0–8.0)

## 2019-03-31 LAB — BASIC METABOLIC PANEL
Anion gap: 10 (ref 5–15)
BUN: 18 mg/dL (ref 8–23)
CO2: 27 mmol/L (ref 22–32)
Calcium: 9.3 mg/dL (ref 8.9–10.3)
Chloride: 100 mmol/L (ref 98–111)
Creatinine, Ser: 0.89 mg/dL (ref 0.44–1.00)
GFR calc Af Amer: >60 mL/min (ref >60)
GFR calc non Af Amer: 57 mL/min — ABNORMAL LOW (ref >60)
Glucose, Bld: 70 mg/dL (ref 70–99)
Potassium: 3.9 mmol/L (ref 3.5–5.1)
Sodium: 137 mmol/L (ref 135–145)

## 2019-03-31 LAB — CBC WITH DIFFERENTIAL/PLATELET
Abs Immature Granulocytes: 0.03 10*3/uL (ref 0.00–0.07)
Basophils Absolute: 0 10*3/uL (ref 0.0–0.1)
Basophils Relative: 1 %
Eosinophils Absolute: 0.1 10*3/uL (ref 0.0–0.5)
Eosinophils Relative: 1 %
HCT: 37.1 % (ref 36.0–46.0)
Hemoglobin: 11.7 g/dL — ABNORMAL LOW (ref 12.0–15.0)
Immature Granulocytes: 1 %
Lymphocytes Relative: 26 %
Lymphs Abs: 1.7 10*3/uL (ref 0.7–4.0)
MCH: 28.5 pg (ref 26.0–34.0)
MCHC: 31.5 g/dL (ref 30.0–36.0)
MCV: 90.3 fL (ref 80.0–100.0)
Monocytes Absolute: 0.8 10*3/uL (ref 0.1–1.0)
Monocytes Relative: 11 %
Neutro Abs: 4.1 10*3/uL (ref 1.7–7.7)
Neutrophils Relative %: 60 %
Platelets: 313 10*3/uL (ref 150–400)
RBC: 4.11 MIL/uL (ref 3.87–5.11)
RDW: 13.5 % (ref 11.5–15.5)
WBC: 6.6 10*3/uL (ref 4.0–10.5)
nRBC: 0 % (ref 0.0–0.2)

## 2019-03-31 LAB — SEDIMENTATION RATE: Sed Rate: 32 mm/hr — ABNORMAL HIGH (ref 0–22)

## 2019-03-31 NOTE — ED Notes (Signed)
Assisted to bathroom to provide urine specimen

## 2019-03-31 NOTE — ED Triage Notes (Addendum)
Pt c/o near syncopal episode x 1 a week ago and x 1 yesterday-c/o HA x "weeks"-states she is on day 2 of cipro for UTI-pt NAD-to tx area steady gait with own rollater

## 2019-03-31 NOTE — ED Notes (Signed)
Pt on monitor 

## 2019-04-01 NOTE — ED Provider Notes (Signed)
[facility] EMERGENCY DEPARTMENT Provider Note   CSN: [id number] Arrival date & time: 03/31/19  1152     History Chief Complaint  Patient presents with  . Near Syncope    Katie Alvarado is a 85 y.o. female.  HPI   85 year old female with multiple complaints.  She reports 2 near syncopal episodes.  One about a week ago and one yesterday.  States that one time she was sitting on the couch and the time she was standing.  Sensation of lightheadedness.  Ringing or ears.  Did not lose consciousness.  No acute pain.  She has been having some mild intermittent headaches for the past several weeks but did not feel like her pain was acutely worse during these episodes.  No chest pain or dyspnea.  Recently diagnosed with a UTI and states that she is currently on antibiotics.  No confusion per her significant other bedside.  No changes in speech.  Past Medical History:  Diagnosis Date  . Acute nasopharyngitis (common cold)   . Arthritis   . Bronchiectasis with acute exacerbation ([place])   . Cancer ([place])    skin cancer  . COPD (chronic obstructive pulmonary disease) ([place])    sees dr. Annamaria Boots   . Cough   . Diabetes mellitus    fasting 130-150  . Dizziness   . Esophageal reflux   . History of radiation therapy 10/24/17- 12/05/17   Lower lip, 4.4 Gy X 10 fractions given twice weekly for a total dose of 44 Gy.  Marland Kitchen Hypertension   . Insomnia, unspecified   . Other symptoms involving nervous and musculoskeletal systems(781.99)   . Pneumonia    hx of  . Shortness of breath    exertion    Patient Active Problem List   Diagnosis Date Noted  . AKI (acute kidney injury) ([place]) 01/10/2019  . Community acquired pneumonia of left lower lobe of lung 01/07/2019  . Thrombocytosis ([place]) 12/25/2018  . Left-sided chest wall pain 06/01/2018  . Back pain 05/05/2018  . Low back pain radiating down leg 05/05/2018  . Low back pain 05/05/2018  . Pneumonia of left lower lobe due to infectious organism  04/28/2018  . 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 Allergic urticaria 03/05/2016  . Gastritis 03/05/2016  . Chronic rhinitis 01/09/2016  . Pruritus 09/05/2015  . Atypical mycobacterial infection of lung ([place])   . Anemia 07/18/2015  . Bronchiectasis without complication ([place]) 0000000  . Renal artery stenosis ([place]) 04/25/2015  . Essential hypertension 04/25/2015  . Lower extremity pain, bilateral 08/04/2012  . Diarrhea 08/03/2012  . Klebsiella infection 08/01/2012  . DM type 2 (diabetes mellitus, type 2) ([place]) 07/29/2012  . Generalized weakness 07/29/2012  . Weakness 04/11/2011  . Hyponatremia 04/11/2011  . UTI (lower urinary tract infection) 04/11/2011  . ARF (acute renal failure) ([place]) 04/11/2011  . Leucocytosis 04/11/2011  . Dehydration 04/11/2011  . Hypotension 04/11/2011  . BRONCHIECTASIS, + [facility] 03/25/2010  . DIZZINESS 02/02/2010  . MUSCULOSKELETAL PAIN 09/25/2007  . INSOMNIA 05/20/2007  . ACUTE NASOPHARYNGITIS 04/20/2007  . Bronchiectasis with acute exacerbation ([place]) 04/20/2007  . Esophageal reflux 04/20/2007    Past Surgical History:  Procedure Laterality Date  . ABDOMINAL HYSTERECTOMY    . ANKLE FRACTURE SURGERY Left   . APPENDECTOMY    . BASAL CELL CARCINOMA EXCISION     NOSE  . CATARACT EXTRACTION, BILATERAL     OUT PATIENT  . COLON SURGERY     colon resection for diverticulitis  .  IR EPIDUROGRAPHY  05/07/2018  . LUMBAR LAMINECTOMY/DECOMPRESSION MICRODISCECTOMY N/A 02/15/2013   Procedure: LUMBAR LAMINECTOMY/DECOMPRESSION MICRODISCECTOMY LUMBAR FOUR-FIVE;  Surgeon: Otilio Connors, MD;  Location: [facility] NEURO ORS;  Service: Neurosurgery;  Laterality: N/A;  . VARICOSE VEIN SURGERY       OB History   No obstetric history on file.     Family History  Problem Relation Age of Onset  . Chronic bronchitis Father   . Other Sister        heart trouble  . Other Brother        heart trouble    Social History   Tobacco Use  . Smoking status: Never  Smoker  . Smokeless tobacco: Never Used  Substance Use Topics  . Alcohol use: Yes    Comment: "Hardly Ever"   . Drug use: No    Home Medications Prior to Admission medications   Medication Sig Start Date End Date Taking? Authorizing Provider  ALPRAZolam (XANAX) 0.25 MG tablet Take 0.25 mg by mouth 2 (two) times daily as needed for sleep or anxiety.    Yes [provider]  Artificial Tear Ointment (DRY EYES OP) Apply 1 drop to eye 2 (two) times daily as needed (for dry eyes).    Yes [provider]  cholecalciferol (VITAMIN D) 1000 UNITS tablet Take 2,000 Units by mouth 2 (two) times a week.    Yes [provider]  Cinnamon 500 MG capsule Take 1,000 mg by mouth 2 (two) times daily as needed (for sugar levels).    Yes [provider]  cycloSPORINE (RESTASIS) 0.05 % ophthalmic emulsion Place 1 drop into both eyes at bedtime as needed (dry eyes).    Yes [provider]  esomeprazole (NEXIUM) 40 MG capsule Take 40 mg by mouth 2 (two) times daily before a meal.    Yes [provider]  feeding supplement, ENSURE ENLIVE, (ENSURE ENLIVE) LIQD Take 237 mLs by mouth 2 (two) times daily between meals. 01/10/19  Yes Dhungel, Nishant, MD  formoterol (PERFOROMIST) 20 MCG/2ML nebulizer solution Take 2 mLs (20 mcg total) by nebulization 2 (two) times daily. Dx: J47.9 01/19/19  Yes Parrett, Fonnie Mu, NP  meclizine (ANTIVERT) 25 MG tablet Take 25 mg by mouth every 6 (six) hours as needed for dizziness.  03/14/15  Yes [provider]  metFORMIN (GLUCOPHAGE) 500 MG tablet Take 1,000 mg by mouth 2 (two) times daily.    Yes [provider]  PROAIR HFA 108 (90 Base) MCG/ACT inhaler INHALE 2 PUFFS INTO THE LUNGS EVERY 6 HOURS AS NEEDED FOR WHEEZING OR SHORTNESS OF BREATH 02/22/19  Yes Young, Clinton D, MD  rosuvastatin (CRESTOR) 5 MG tablet Take 5 mg by mouth once a week. 01/09/19  Yes [provider]  traMADol (ULTRAM) 50 MG tablet Take 0.5  tablets (25 mg total) by mouth every 6 (six) hours as needed for moderate pain. 05/08/18  Yes Georgette Shell, MD  vitamin E 400 UNIT capsule Take 400 Units by mouth 2 (two) times a week.    Yes [provider]  acetaminophen (TYLENOL) 325 MG tablet Take 2 tablets (650 mg total) by mouth every 6 (six) hours as needed for pain. Patient taking differently: Take 325 mg by mouth every 6 (six) hours as needed for pain.  08/05/12   Eugenie Filler, MD  amLODipine (NORVASC) 5 MG tablet Take 2.5 mg by mouth daily.     [provider]  guaiFENesin-dextromethorphan (ROBITUSSIN DM) 100-10 MG/5ML syrup Take  5 mLs by mouth every 4 (four) hours as needed for cough. 01/10/19   Dhungel, Flonnie Overman, MD  ONE TOUCH ULTRA TEST test strip USE TO CHECK BLOOD SUGAR ONCE DAILY 90 01/04/16   [provider]  [facility] DELICA LANCETS [id number] MISC USE TO CHECK BLOOD SUGAR ONCE DAILY AS DIRECTED 01/04/16   [provider]  Respiratory Therapy Supplies (FLUTTER) DEVI Use as directed 11/03/15   Deneise Lever, MD    Allergies    Welchol Melvyn Neth hcl], Glucosamine, Tramadol, Celecoxib, and Statins  Review of Systems   Review of Systems All systems reviewed and negative, other than as noted in HPI.  Physical Exam Updated Vital Signs BP (!) 167/73   Pulse 82   Temp 98.7 F (37.1 C) (Oral)   Resp (!) 22   SpO2 96%   Physical Exam Vitals and nursing note reviewed.  Constitutional:      General: She is not in acute distress.    Appearance: She is well-developed.  HENT:     Head: Normocephalic and atraumatic.  Eyes:     General:        Right eye: No discharge.        Left eye: No discharge.     Conjunctiva/sclera: Conjunctivae normal.  Cardiovascular:     Rate and Rhythm: Normal rate and regular rhythm.     Heart sounds: Normal heart sounds. No murmur. No friction rub. No gallop.   Pulmonary:     Effort: Pulmonary effort is normal. No respiratory distress.     Breath  sounds: Normal breath sounds.  Abdominal:     General: There is no distension.     Palpations: Abdomen is soft.     Tenderness: There is no abdominal tenderness.  Musculoskeletal:        General: No tenderness.     Cervical back: Neck supple.  Skin:    General: Skin is warm and dry.  Neurological:     General: No focal deficit present.     Mental Status: She is alert and oriented to person, place, and time.     Cranial Nerves: No cranial nerve deficit.     Sensory: No sensory deficit.     Motor: No weakness.     Coordination: Coordination normal.  Psychiatric:        Behavior: Behavior normal.        Thought Content: Thought content normal.     ED Results / Procedures / Treatments   Labs (all labs ordered are listed, but only abnormal results are displayed) Labs Reviewed  CBC WITH DIFFERENTIAL/PLATELET - Abnormal; Notable for the following components:      Result Value   Hemoglobin 11.7 (*)    All other components within normal limits  BASIC METABOLIC PANEL - Abnormal; Notable for the following components:   GFR calc non Af Amer 57 (*)    All other components within normal limits  URINALYSIS, ROUTINE W REFLEX MICROSCOPIC - Abnormal; Notable for the following components:   Protein, ur 100 (*)    All other components within normal limits  SEDIMENTATION RATE - Abnormal; Notable for the following components:   Sed Rate 32 (*)    All other components within normal limits  URINALYSIS, MICROSCOPIC (REFLEX) - Abnormal; Notable for the following components:   Bacteria, UA RARE (*)    All other components within normal limits    EKG EKG Interpretation  Date/Time:  Wednesday March 31 2019 12:13:58 EST Ventricular Rate:  85  PR Interval:    QRS Duration: 84 QT Interval:  365 QTC Calculation: 434 R Axis:   -6 Text Interpretation: Sinus rhythm Confirmed by Virgel Manifold [phone]) on 03/31/2019 12:29:34 PM   Radiology CT Head Wo Contrast  Result Date: 03/31/2019 CLINICAL  DATA:  Severe headache EXAM: CT HEAD WITHOUT CONTRAST TECHNIQUE: Contiguous axial images were obtained from the base of the skull through the vertex without intravenous contrast. COMPARISON:  2015 FINDINGS: Brain: There is no acute intracranial hemorrhage, mass-effect, or edema. Gray-white differentiation is preserved. There is no extra-axial fluid collection. Prominence of the ventricles and sulci reflects minor generalized parenchymal prominence. Patchy hypoattenuation in the supratentorial is nonspecific but likely reflects mild chronic microvascular ischemic changes. Vascular: There is atherosclerotic calcification at the skull base. Skull: Calvarium is unremarkable. Sinuses/Orbits: Retained secretions in the sphenoid sinuses. Visualized orbits are unremarkable. Other: Mastoid air cells are clear. IMPRESSION: No acute intracranial hemorrhage, mass effect, or evidence of acute infarction. Mild chronic microvascular ischemic changes. Electronically Signed   By: Macy Mis M.D.   On: 03/31/2019 13:21    Procedures Procedures (including critical care time)  Medications Ordered in ED Medications - No data to display  ED Course  I have reviewed the triage vital signs and the nursing notes.  Pertinent labs & imaging results that were available during my care of the patient were reviewed by me and considered in my medical decision making (see chart for details).    MDM Rules/Calculators/A&P     89yF with near syncope? No symptoms of this currently. Mild intermittent HA. Currently asymptomatic. NEuro exam non focal. HEad CT w/o acute abnormality. It has been determined that no acute conditions requiring further emergency intervention are present at this time. The patient has been advised of the diagnosis and plan. I reviewed any labs and imaging including any potential incidental findings. I have reviewed nursing notes and appropriate previous records. We have discussed signs and symptoms that warrant  return to the ED and they are listed in the discharge instructions.     Final Clinical Impression(s) / ED Diagnoses Final diagnoses:  Dizziness    Rx / DC Orders ED Discharge Orders    None       Virgel Manifold, MD 04/01/19 1058

## 2019-04-12 ENCOUNTER — Ambulatory Visit: Payer: Medicare Other | Admitting: Internal Medicine

## 2019-04-26 ENCOUNTER — Ambulatory Visit: Payer: Medicare Other | Admitting: Internal Medicine

## 2019-05-07 ENCOUNTER — Ambulatory Visit (INDEPENDENT_AMBULATORY_CARE_PROVIDER_SITE_OTHER): Payer: Medicare Other | Admitting: Otolaryngology

## 2019-05-13 DIAGNOSIS — Z85828 Personal history of other malignant neoplasm of skin: Secondary | ICD-10-CM | POA: Diagnosis not present

## 2019-05-13 DIAGNOSIS — L570 Actinic keratosis: Secondary | ICD-10-CM | POA: Diagnosis not present

## 2019-05-13 DIAGNOSIS — L219 Seborrheic dermatitis, unspecified: Secondary | ICD-10-CM | POA: Diagnosis not present

## 2019-05-13 DIAGNOSIS — L905 Scar conditions and fibrosis of skin: Secondary | ICD-10-CM | POA: Diagnosis not present

## 2019-05-14 ENCOUNTER — Other Ambulatory Visit: Payer: Self-pay

## 2019-05-14 ENCOUNTER — Encounter (INDEPENDENT_AMBULATORY_CARE_PROVIDER_SITE_OTHER): Payer: Self-pay | Admitting: Otolaryngology

## 2019-05-14 ENCOUNTER — Ambulatory Visit (INDEPENDENT_AMBULATORY_CARE_PROVIDER_SITE_OTHER): Payer: Medicare Other | Admitting: Otolaryngology

## 2019-05-14 VITALS — Temp 97.9°F

## 2019-05-14 DIAGNOSIS — H6122 Impacted cerumen, left ear: Secondary | ICD-10-CM

## 2019-05-14 DIAGNOSIS — M26609 Unspecified temporomandibular joint disorder, unspecified side: Secondary | ICD-10-CM | POA: Diagnosis not present

## 2019-05-14 NOTE — Progress Notes (Signed)
HPI: Katie Alvarado is a 86 y.o. female who returns today for evaluation of left ear pain she has had for about a month now.  She had previously been seen 2 years ago with similar problems that was felt to be secondary to TMJ.  She states her ear hurts when she wears her hearing aid.  She has lost weight and has not been eating well.  She apparently has kidney disease and her doctor did not feel like she should be taking ibuprofen although she is taken some to help some with the discomfort and pain.  She feels like she has wax buildup in the ear.  Past Medical History:  Diagnosis Date  . Acute nasopharyngitis (common cold)   . Arthritis   . Bronchiectasis with acute exacerbation ([place])   . Cancer ([place])    skin cancer  . COPD (chronic obstructive pulmonary disease) ([place])    sees dr. Annamaria Boots   . Cough   . Diabetes mellitus    fasting 130-150  . Dizziness   . Esophageal reflux   . History of radiation therapy 10/24/17- 12/05/17   Lower lip, 4.4 Gy X 10 fractions given twice weekly for a total dose of 44 Gy.  Marland Kitchen Hypertension   . Insomnia, unspecified   . Other symptoms involving nervous and musculoskeletal systems(781.99)   . Pneumonia    hx of  . Shortness of breath    exertion   Past Surgical History:  Procedure Laterality Date  . ABDOMINAL HYSTERECTOMY    . ANKLE FRACTURE SURGERY Left   . APPENDECTOMY    . BASAL CELL CARCINOMA EXCISION     NOSE  . CATARACT EXTRACTION, BILATERAL     OUT PATIENT  . COLON SURGERY     colon resection for diverticulitis  . IR EPIDUROGRAPHY  05/07/2018  . LUMBAR LAMINECTOMY/DECOMPRESSION MICRODISCECTOMY N/A 02/15/2013   Procedure: LUMBAR LAMINECTOMY/DECOMPRESSION MICRODISCECTOMY LUMBAR FOUR-FIVE;  Surgeon: Otilio Connors, MD;  Location: [facility];  Service: Neurosurgery;  Laterality: N/A;  . VARICOSE VEIN SURGERY     Social History   Socioeconomic History  . Marital status: Widowed    Spouse name: Not on file  . Number of children: 2  . Years of  education: Not on file  . Highest education level: Not on file  Occupational History  . Not on file  Tobacco Use  . Smoking status: Never Smoker  . Smokeless tobacco: Never Used  Substance and Sexual Activity  . Alcohol use: Yes    Comment: "Hardly Ever"   . Drug use: No  . Sexual activity: Not Currently    Birth control/protection: Post-menopausal  Other Topics Concern  . Not on file  Social History Narrative  . Not on file   Social Determinants of Health   Financial Resource Strain:   . Difficulty of Paying Living Expenses: Not on file  Food Insecurity:   . Worried About Charity fundraiser in the Last Year: Not on file  . Ran Out of Food in the Last Year: Not on file  Transportation Needs:   . Lack of Transportation (Medical): Not on file  . Lack of Transportation (Non-Medical): Not on file  Physical Activity:   . Days of Exercise per Week: Not on file  . Minutes of Exercise per Session: Not on file  Stress:   . Feeling of Stress : Not on file  Social Connections:   . Frequency of Communication with Friends and Family: Not on file  .  Frequency of Social Gatherings with Friends and Family: Not on file  . Attends Religious Services: Not on file  . Active Member of Clubs or Organizations: Not on file  . Attends Archivist Meetings: Not on file  . Marital Status: Not on file   Family History  Problem Relation Age of Onset  . Chronic bronchitis Father   . Other Sister        heart trouble  . Other Brother        heart trouble   Allergies  Allergen Reactions  . Welchol [Colesevelam Hcl] Hives  . Glucosamine Other (See Comments)    Unknown  . Tramadol Nausea Only  . Celecoxib Hives, Itching and Rash  . Statins Rash   Prior to Admission medications   Medication Sig Start Date End Date Taking? Authorizing Provider  acetaminophen (TYLENOL) 325 MG tablet Take 2 tablets (650 mg total) by mouth every 6 (six) hours as needed for pain. Patient taking  differently: Take 325 mg by mouth every 6 (six) hours as needed for pain.  08/05/12  Yes Eugenie Filler, MD  ALPRAZolam Duanne Moron) 0.25 MG tablet Take 0.25 mg by mouth 2 (two) times daily as needed for sleep or anxiety.    Yes [provider]  amLODipine (NORVASC) 5 MG tablet Take 2.5 mg by mouth daily.    Yes [provider]  Artificial Tear Ointment (DRY EYES OP) Apply 1 drop to eye 2 (two) times daily as needed (for dry eyes).    Yes [provider]  cholecalciferol (VITAMIN D) 1000 UNITS tablet Take 2,000 Units by mouth 2 (two) times a week.    Yes [provider]  Cinnamon 500 MG capsule Take 1,000 mg by mouth 2 (two) times daily as needed (for sugar levels).    Yes [provider]  cycloSPORINE (RESTASIS) 0.05 % ophthalmic emulsion Place 1 drop into both eyes at bedtime as needed (dry eyes).    Yes [provider]  esomeprazole (NEXIUM) 40 MG capsule Take 40 mg by mouth 2 (two) times daily before a meal.    Yes [provider]  feeding supplement, ENSURE ENLIVE, (ENSURE ENLIVE) LIQD Take 237 mLs by mouth 2 (two) times daily between meals. 01/10/19  Yes Dhungel, Nishant, MD  formoterol (PERFOROMIST) 20 MCG/2ML nebulizer solution Take 2 mLs (20 mcg total) by nebulization 2 (two) times daily. Dx: J47.9 01/19/19  Yes Parrett, Tammy S, NP  guaiFENesin-dextromethorphan (ROBITUSSIN DM) 100-10 MG/5ML syrup Take 5 mLs by mouth every 4 (four) hours as needed for cough. 01/10/19  Yes Dhungel, Nishant, MD  meclizine (ANTIVERT) 25 MG tablet Take 25 mg by mouth every 6 (six) hours as needed for dizziness.  03/14/15  Yes [provider]  metFORMIN (GLUCOPHAGE) 500 MG tablet Take 1,000 mg by mouth 2 (two) times daily.    Yes [provider]  ONE TOUCH ULTRA TEST test strip USE TO CHECK BLOOD SUGAR ONCE DAILY 90 01/04/16  Yes [provider]  ONETOUCH DELICA LANCETS 99991111 MISC USE TO CHECK BLOOD SUGAR ONCE DAILY AS DIRECTED  01/04/16  Yes [provider]  PROAIR HFA 108 (90 Base) MCG/ACT inhaler INHALE 2 PUFFS INTO THE LUNGS EVERY 6 HOURS AS NEEDED FOR WHEEZING OR SHORTNESS OF BREATH 02/22/19  Yes Deneise Lever, MD  Respiratory Therapy Supplies (FLUTTER) DEVI Use as directed 11/03/15  Yes Young, Clinton D, MD  rosuvastatin (CRESTOR) 5 MG tablet Take 5 mg by mouth once a week. 01/09/19  Yes [provider]  traMADol (ULTRAM) 50 MG tablet Take 0.5 tablets (25 mg total) by mouth every 6 (six) hours as needed for moderate pain. 05/08/18  Yes Georgette Shell, MD  vitamin E 400 UNIT capsule Take 400 Units by mouth 2 (two) times a week.    Yes [provider]     Positive ROS: Otherwise negative  All other systems have been reviewed and were otherwise negative with the exception of those mentioned in the HPI and as above.  Physical Exam: Constitutional: Alert, well-appearing, no acute distress Ears: External ears without lesions or tenderness. Ear canals with minimal wax buildup but that was cleaned with curettes.  No obvious inflammation noted however when cleaning the left ear canal it was tender to clean.  No obvious infection noted or cyst.  TM was clear in the more medial ear canal was normal. Nasal: External nose without lesions. Septum midline.. Clear nasal passages Oral: Lips and gums without lesions. Tongue and palate mucosa without lesions. Posterior oropharynx clear.  Patient is status post tonsillectomy with normal-appearing tonsil regions.                                                           Neck: No palpable adenopathy or masses.  She has mild palpation of the TMJ area but this is fairly minimal there is no swelling or erythema. Respiratory: Breathing comfortably  Skin: No facial/neck lesions or rash noted.  Cerumen impaction removal  Date/Time: 05/14/2019 2:58 PM Performed by: Rozetta Nunnery, MD Authorized by: Rozetta Nunnery, MD   Consent:    Consent  obtained:  Verbal   Consent given by:  Patient   Risks discussed:  Pain and bleeding Procedure details:    Location:  L ear   Procedure type: curette   Post-procedure details:    Inspection:  TM intact and canal normal   Hearing quality:  Improved   Patient tolerance of procedure:  Tolerated well, no immediate complications Comments:     TMs are clear bilaterally.    Assessment: Left otalgia.  Questionable TMJ versus mild inflammatory changes of the ear canal  Plan: Recommended using NSAID twice daily for 1 week as well as soft diet. Also prescribed Cortisporin otic suspension drops for the left ear 4 to 5 drops twice daily for 5 days. She will follow-up if symptoms worsen.   Radene Journey, MD

## 2019-05-22 DIAGNOSIS — M26622 Arthralgia of left temporomandibular joint: Secondary | ICD-10-CM | POA: Diagnosis not present

## 2019-05-22 DIAGNOSIS — N390 Urinary tract infection, site not specified: Secondary | ICD-10-CM | POA: Diagnosis not present

## 2019-05-22 DIAGNOSIS — R350 Frequency of micturition: Secondary | ICD-10-CM | POA: Diagnosis not present

## 2019-06-16 DIAGNOSIS — M25512 Pain in left shoulder: Secondary | ICD-10-CM | POA: Diagnosis not present

## 2019-08-02 ENCOUNTER — Other Ambulatory Visit: Payer: Self-pay | Admitting: Internal Medicine

## 2019-08-04 DIAGNOSIS — M21371 Foot drop, right foot: Secondary | ICD-10-CM | POA: Diagnosis not present

## 2019-08-04 DIAGNOSIS — M25561 Pain in right knee: Secondary | ICD-10-CM | POA: Diagnosis not present

## 2019-08-12 DIAGNOSIS — R001 Bradycardia, unspecified: Secondary | ICD-10-CM | POA: Diagnosis not present

## 2019-08-12 DIAGNOSIS — I10 Essential (primary) hypertension: Secondary | ICD-10-CM | POA: Diagnosis not present

## 2019-08-12 DIAGNOSIS — E871 Hypo-osmolality and hyponatremia: Secondary | ICD-10-CM | POA: Diagnosis not present

## 2019-08-12 DIAGNOSIS — E785 Hyperlipidemia, unspecified: Secondary | ICD-10-CM | POA: Diagnosis not present

## 2019-08-12 DIAGNOSIS — D649 Anemia, unspecified: Secondary | ICD-10-CM | POA: Diagnosis not present

## 2019-08-12 DIAGNOSIS — Z13820 Encounter for screening for osteoporosis: Secondary | ICD-10-CM | POA: Diagnosis not present

## 2019-08-12 DIAGNOSIS — R809 Proteinuria, unspecified: Secondary | ICD-10-CM | POA: Diagnosis not present

## 2019-08-12 DIAGNOSIS — J479 Bronchiectasis, uncomplicated: Secondary | ICD-10-CM | POA: Diagnosis not present

## 2019-08-12 DIAGNOSIS — F419 Anxiety disorder, unspecified: Secondary | ICD-10-CM | POA: Diagnosis not present

## 2019-08-12 DIAGNOSIS — G720 Drug-induced myopathy: Secondary | ICD-10-CM | POA: Diagnosis not present

## 2019-08-12 DIAGNOSIS — M79604 Pain in right leg: Secondary | ICD-10-CM | POA: Diagnosis not present

## 2019-08-12 DIAGNOSIS — E118 Type 2 diabetes mellitus with unspecified complications: Secondary | ICD-10-CM | POA: Diagnosis not present

## 2019-08-13 ENCOUNTER — Telehealth: Payer: Self-pay

## 2019-08-13 NOTE — Telephone Encounter (Signed)
NOTES ON FILE FROM [facility], SENT REFERRAL TO SCHEDULING

## 2019-08-16 ENCOUNTER — Ambulatory Visit: Payer: Medicare Other | Admitting: Internal Medicine

## 2019-08-31 DIAGNOSIS — H35372 Puckering of macula, left eye: Secondary | ICD-10-CM | POA: Diagnosis not present

## 2019-08-31 DIAGNOSIS — H353223 Exudative age-related macular degeneration, left eye, with inactive scar: Secondary | ICD-10-CM | POA: Diagnosis not present

## 2019-08-31 DIAGNOSIS — H35423 Microcystoid degeneration of retina, bilateral: Secondary | ICD-10-CM | POA: Diagnosis not present

## 2019-08-31 DIAGNOSIS — H353114 Nonexudative age-related macular degeneration, right eye, advanced atrophic with subfoveal involvement: Secondary | ICD-10-CM | POA: Diagnosis not present

## 2019-09-14 ENCOUNTER — Encounter: Payer: Self-pay | Admitting: Cardiology

## 2019-09-14 ENCOUNTER — Telehealth: Payer: Self-pay | Admitting: Radiology

## 2019-09-14 ENCOUNTER — Other Ambulatory Visit: Payer: Self-pay

## 2019-09-14 ENCOUNTER — Ambulatory Visit (INDEPENDENT_AMBULATORY_CARE_PROVIDER_SITE_OTHER): Payer: Medicare Other | Admitting: Cardiology

## 2019-09-14 VITALS — BP 120/70 | HR 75 | Ht 68.0 in | Wt 135.8 lb

## 2019-09-14 DIAGNOSIS — R002 Palpitations: Secondary | ICD-10-CM | POA: Diagnosis not present

## 2019-09-14 DIAGNOSIS — I10 Essential (primary) hypertension: Secondary | ICD-10-CM | POA: Diagnosis not present

## 2019-09-14 NOTE — Progress Notes (Signed)
Cardiology Office Note:    Date:  09/14/2019   ID:  Katie Alvarado, DOB 1925/05/27, MRN [id number]  PCP:  Hulan Fess, MD  Cardiologist:  No primary care provider on file.  Electrophysiologist:  None   Referring MD: Hulan Fess, MD     History of Present Illness:    Katie Alvarado is a 86 y.o. female here for evaluation of bradycardia at the request of Dr. Rex Kras.  I saw her last in 2018.  Has COPD diabetes palpitations mild/moderate bilateral renal artery stenosis not likely hemodynamically significant seen previously by Dr. Fletcher Anon.  Dr. Annamaria Boots follows her COPD.  Felt like a sinking (like her mother) No syncope. Off caffeine and feels better.   Past Medical History:  Diagnosis Date  . Acute nasopharyngitis (common cold)   . Arthritis   . Bronchiectasis with acute exacerbation ([place])   . Cancer ([place])    skin cancer  . COPD (chronic obstructive pulmonary disease) ([place])    sees dr. Annamaria Boots   . Cough   . Diabetes mellitus    fasting 130-150  . Dizziness   . Esophageal reflux   . History of radiation therapy 10/24/17- 12/05/17   Lower lip, 4.4 Gy X 10 fractions given twice weekly for a total dose of 44 Gy.  Marland Kitchen Hypertension   . Insomnia, unspecified   . Other symptoms involving nervous and musculoskeletal systems(781.99)   . Pneumonia    hx of  . Shortness of breath    exertion    Past Surgical History:  Procedure Laterality Date  . ABDOMINAL HYSTERECTOMY    . ANKLE FRACTURE SURGERY Left   . APPENDECTOMY    . BASAL CELL CARCINOMA EXCISION     NOSE  . CATARACT EXTRACTION, BILATERAL     OUT PATIENT  . COLON SURGERY     colon resection for diverticulitis  . IR EPIDUROGRAPHY  05/07/2018  . LUMBAR LAMINECTOMY/DECOMPRESSION MICRODISCECTOMY N/A 02/15/2013   Procedure: LUMBAR LAMINECTOMY/DECOMPRESSION MICRODISCECTOMY LUMBAR FOUR-FIVE;  Surgeon: Otilio Connors, MD;  Location: [facility];  Service: Neurosurgery;  Laterality: N/A;  . VARICOSE VEIN SURGERY      Current  Medications: Current Meds  Medication Sig  . acetaminophen (TYLENOL) 325 MG tablet Take 2 tablets (650 mg total) by mouth every 6 (six) hours as needed for pain. (Patient taking differently: Take 650 mg by mouth every 6 (six) hours as needed. )  . ALPRAZolam (XANAX) 0.25 MG tablet Take 0.25 mg by mouth 2 (two) times daily as needed for sleep or anxiety.   . Artificial Tear Ointment (DRY EYES OP) Apply 1 drop to eye 2 (two) times daily as needed (for dry eyes).   . budesonide (PULMICORT) 0.25 MG/2ML nebulizer solution USE 1 VIAL  IN  NEBULIZER TWICE  DAILY - rinse mouth after treatment  . cholecalciferol (VITAMIN D) 1000 UNITS tablet Take 2,000 Units by mouth 2 (two) times a week.   . Cinnamon 500 MG capsule Take 1,000 mg by mouth 2 (two) times daily as needed (for sugar levels).   . cycloSPORINE (RESTASIS) 0.05 % ophthalmic emulsion Place 1 drop into both eyes at bedtime as needed (dry eyes).   Marland Kitchen esomeprazole (NEXIUM) 40 MG capsule Take 40 mg by mouth 2 (two) times daily before a meal.   . feeding supplement, ENSURE ENLIVE, (ENSURE ENLIVE) LIQD Take 237 mLs by mouth 2 (two) times daily between meals.  . formoterol (PERFOROMIST) 20 MCG/2ML nebulizer solution Take 2 mLs (20 mcg total) by  nebulization 2 (two) times daily. Dx: J47.9  . guaiFENesin-dextromethorphan (ROBITUSSIN DM) 100-10 MG/5ML syrup Take 5 mLs by mouth every 4 (four) hours as needed for cough.  Marland Kitchen ipratropium (ATROVENT) 0.02 % nebulizer solution USE 1 VIAL IN NEBULIZER 4 TIMES DAILY  . losartan (COZAAR) 100 MG tablet Take 100 mg by mouth daily.  . meclizine (ANTIVERT) 25 MG tablet Take 25 mg by mouth every 6 (six) hours as needed for dizziness.   . metFORMIN (GLUCOPHAGE) 500 MG tablet Take 1,000 mg by mouth 2 (two) times daily.   . ONE TOUCH ULTRA TEST test strip USE TO CHECK BLOOD SUGAR ONCE DAILY 90  . ONETOUCH DELICA LANCETS 99991111 MISC USE TO CHECK BLOOD SUGAR ONCE DAILY AS DIRECTED  . PROAIR HFA 108 (90 Base) MCG/ACT inhaler INHALE  2 PUFFS INTO THE LUNGS EVERY 6 HOURS AS NEEDED FOR WHEEZING OR SHORTNESS OF BREATH  . Respiratory Therapy Supplies (FLUTTER) DEVI Use as directed  . rosuvastatin (CRESTOR) 5 MG tablet Take 5 mg by mouth once a week.  . traMADol (ULTRAM) 50 MG tablet Take 0.5 tablets (25 mg total) by mouth every 6 (six) hours as needed for moderate pain.  . vitamin E 400 UNIT capsule Take 400 Units by mouth 2 (two) times a week.      Allergies:   Welchol [colesevelam hcl], Glucosamine, Tramadol, Celecoxib, and Statins   Social History   Socioeconomic History  . Marital status: Widowed    Spouse name: Not on file  . Number of children: 2  . Years of education: Not on file  . Highest education level: Not on file  Occupational History  . Not on file  Tobacco Use  . Smoking status: Never Smoker  . Smokeless tobacco: Never Used  Substance and Sexual Activity  . Alcohol use: Yes    Comment: "Hardly Ever"   . Drug use: No  . Sexual activity: Not Currently    Birth control/protection: Post-menopausal  Other Topics Concern  . Not on file  Social History Narrative  . Not on file   Social Determinants of Health   Financial Resource Strain:   . Difficulty of Paying Living Expenses:   Food Insecurity:   . Worried About Charity fundraiser in the Last Year:   . Arboriculturist in the Last Year:   Transportation Needs:   . Film/video editor (Medical):   Marland Kitchen Lack of Transportation (Non-Medical):   Physical Activity:   . Days of Exercise per Week:   . Minutes of Exercise per Session:   Stress:   . Feeling of Stress :   Social Connections:   . Frequency of Communication with Friends and Family:   . Frequency of Social Gatherings with Friends and Family:   . Attends Religious Services:   . Active Member of Clubs or Organizations:   . Attends Archivist Meetings:   Marland Kitchen Marital Status:      Family History: The patient's family history includes Chronic bronchitis in her father; Other in  her brother and sister.  ROS:   Please see the history of present illness.    No fevers chills nausea vomiting syncope bleeding all other systems reviewed and are negative.  EKGs/Labs/Other Studies Reviewed:    The following studies were reviewed today: CT scan September 2020.  EKG:  EKG is  ordered today.  The ekg ordered today demonstrates sinus rhythm left anterior fascicular block 75 bpm  Recent Labs: 01/07/2019: ALT 14; TSH  2.852 03/31/2019: BUN 18; Creatinine, Ser 0.89; Hemoglobin 11.7; Platelets 313; Potassium 3.9; Sodium 137  Recent Lipid Panel No results found for: CHOL, TRIG, HDL, CHOLHDL, VLDL, LDLCALC, LDLDIRECT  Physical Exam:    VS:  BP 120/70   Pulse 75   Ht 5\' 8"  (1.727 m)   Wt 135 lb 12.8 oz (61.6 kg)   SpO2 98%   BMI 20.65 kg/m     Wt Readings from Last 3 Encounters:  09/14/19 135 lb 12.8 oz (61.6 kg)  02/16/19 136 lb 12.8 oz (62.1 kg)  01/07/19 127 lb 13.9 oz (58 kg)     GEN: Elderly well nourished, well developed in no acute distress HEENT: Normal NECK: No JVD; No carotid bruits LYMPHATICS: No lymphadenopathy CARDIAC: RRR, no murmurs, rubs, gallops RESPIRATORY:  Clear to auscultation without rales, wheezing or rhonchi  ABDOMEN: Soft, non-tender, non-distended MUSCULOSKELETAL:  No edema; No deformity  SKIN: Warm and dry NEUROLOGIC:  Alert and oriented x 3 PSYCHIATRIC:  Normal affect   ASSESSMENT:    1. Palpitations   2. Essential hypertension    PLAN:    In order of problems listed above:  Palpitations/sinking feeling -I am going to check a Zio patch monitor for 14 days to make sure she does not have any adverse arrhythmias/pauses/conduction defects.  This is so we can rule out the possibility of pacemaker needs.  Her current EKG is reassuring except for left anterior fascicular block.  Left anterior fascicular block -Checking Zio patch monitor.  Macular degeneration -Has not driven for 7 years.  Her daughter would like for her to go  live with her in [place] however she would like to stay here and be as independent as possible.  She does have health that comes over to the house etc.  Challenging.   Medication Adjustments/Labs and Tests Ordered: Current medicines are reviewed at length with the patient today.  Concerns regarding medicines are outlined above.  Orders Placed This Encounter  Procedures  . LONG TERM MONITOR (3-14 DAYS)  . EKG 12-Lead   No orders of the defined types were placed in this encounter.   Patient Instructions  Medication Instructions:  The current medical regimen is effective;  continue present plan and medications.  *If you need a refill on your cardiac medications before your next appointment, please call your pharmacy*  Testing/Procedures: [place] Monitor Instructions   Your physician has requested you wear your ZIO patch monitor 14 days.   This is a single patch monitor.  [employer] supplies one patch monitor per enrollment.  Additional stickers are not available.   Please do not apply patch if you will be having a Nuclear Stress Test, Echocardiogram, Cardiac CT, MRI, or Chest Xray during the time frame you would be wearing the monitor. The patch cannot be worn during these tests.  You cannot remove and re-apply the ZIO XT patch monitor.   Your ZIO patch monitor will be sent USPS Priority mail from [facility] directly to your home address. The monitor may also be mailed to a PO BOX if home delivery is not available.   It may take 3-5 days to receive your monitor after you have been enrolled.   Once you have received you monitor, please review enclosed instructions.  Your monitor has already been registered assigning a specific monitor serial # to you.   Applying the monitor   Shave hair from upper left chest.   Hold abrader disc by orange tab.  Rub abrader  in 40 strokes over left upper chest as indicated in your monitor instructions.   Clean area with 4 enclosed  alcohol pads .  Use all pads to assure are is cleaned thoroughly.  Let dry.   Apply patch as indicated in monitor instructions.  Patch will be place under collarbone on left side of chest with arrow pointing upward.   Rub patch adhesive wings for 2 minutes.Remove white label marked "1".  Remove white label marked "2".  Rub patch adhesive wings for 2 additional minutes.   While looking in a mirror, press and release button in center of patch.  A small green light will flash 3-4 times .  This will be your only indicator the monitor has been turned on.     Do not shower for the first 24 hours.  You may shower after the first 24 hours.   Press button if you feel a symptom. You will hear a small click.  Record Date, Time and Symptom in the Patient Log Book.   When you are ready to remove patch, follow instructions on last 2 pages of Patient Log Book.  Stick patch monitor onto last page of Patient Log Book.   Place Patient Log Book in [place] box.  Use locking tab on box and tape box closed securely.  The [employer] has [employer] on it.  Please place in mailbox as soon as possible.  Your physician should have your test results approximately 7 days after the monitor has been mailed back to [facility].   Call [place] at [phone] if you have questions regarding your ZIO XT patch monitor.  Call them immediately if you see an orange light blinking on your monitor.   If your monitor falls off in less than 4 days contact our Monitor department at [phone].  If your monitor becomes loose or falls off after 4 days call [employer] at [phone] for suggestions on securing your monitor.    Follow-Up: At [facility], you and your health needs are our priority.  As part of our continuing mission to provide you with exceptional heart care, we have created designated Provider Care Teams.  These Care Teams include your primary Cardiologist (physician) and Advanced  Practice Providers (APPs -  Physician Assistants and Nurse Practitioners) who all work together to provide you with the care you need, when you need it.  We recommend signing up for the patient portal called "MyChart".  Sign up information is provided on this After Visit Summary.  MyChart is used to connect with patients for Virtual Visits (Telemedicine).  Patients are able to view lab/test results, encounter notes, upcoming appointments, etc.  Non-urgent messages can be sent to your provider as well.   To learn more about what you can do with MyChart, go to [email or web address].    Your next appointment:   6 month(s)  The format for your next appointment:   In Person  Provider:   Candee Furbish, MD   Thank you for choosing [facility]!!        Signed, Candee Furbish, MD  09/14/2019 5:00 PM    [place]

## 2019-09-14 NOTE — Telephone Encounter (Signed)
Enrolled patient for a 14 day Zio monitor to be mailed to patients home.  

## 2019-09-14 NOTE — Patient Instructions (Signed)
Medication Instructions:  The current medical regimen is effective;  continue present plan and medications.  *If you need a refill on your cardiac medications before your next appointment, please call your pharmacy*  Testing/Procedures: ZIO XT- Long Term Monitor Instructions   Your physician has requested you wear your ZIO patch monitor 14 days.   This is a single patch monitor.  [employer] supplies one patch monitor per enrollment.  Additional stickers are not available.   Please do not apply patch if you will be having a Nuclear Stress Test, Echocardiogram, Cardiac CT, MRI, or Chest Xray during the time frame you would be wearing the monitor. The patch cannot be worn during these tests.  You cannot remove and re-apply the ZIO XT patch monitor.   Your ZIO patch monitor will be sent USPS Priority mail from [employer] directly to your home address. The monitor may also be mailed to a PO BOX if home delivery is not available.   It may take 3-5 days to receive your monitor after you have been enrolled.   Once you have received you monitor, please review enclosed instructions.  Your monitor has already been registered assigning a specific monitor serial # to you.   Applying the monitor   Shave hair from upper left chest.   Hold abrader disc by orange tab.  Rub abrader in 40 strokes over left upper chest as indicated in your monitor instructions.   Clean area with 4 enclosed alcohol pads .  Use all pads to assure are is cleaned thoroughly.  Let dry.   Apply patch as indicated in monitor instructions.  Patch will be place under collarbone on left side of chest with arrow pointing upward.   Rub patch adhesive wings for 2 minutes.Remove white label marked "1".  Remove white label marked "2".  Rub patch adhesive wings for 2 additional minutes.   While looking in a mirror, press and release button in center of patch.  A small green light will flash 3-4 times .  This will be your only  indicator the monitor has been turned on.     Do not shower for the first 24 hours.  You may shower after the first 24 hours.   Press button if you feel a symptom. You will hear a small click.  Record Date, Time and Symptom in the Patient Log Book.   When you are ready to remove patch, follow instructions on last 2 pages of Patient Log Book.  Stick patch monitor onto last page of Patient Log Book.   Place Patient Log Book in Blue box.  Use locking tab on box and tape box closed securely.  The Orange and White box has prepaid postage on it.  Please place in mailbox as soon as possible.  Your physician should have your test results approximately 7 days after the monitor has been mailed back to [employer].   Call [employer] Customer Care at [phone] if you have questions regarding your ZIO XT patch monitor.  Call them immediately if you see an orange light blinking on your monitor.   If your monitor falls off in less than 4 days contact our Monitor department at [phone].  If your monitor becomes loose or falls off after 4 days call [employer] at [phone] for suggestions on securing your monitor.   Follow-Up: At [facility], you and your health needs are our priority.  As part of our continuing mission to provide you with exceptional heart care, we have   created designated Provider Care Teams.  These Care Teams include your primary Cardiologist (physician) and Advanced Practice Providers (APPs -  Physician Assistants and Nurse Practitioners) who all work together to provide you with the care you need, when you need it.  We recommend signing up for the patient portal called "MyChart".  Sign up information is provided on this After Visit Summary.  MyChart is used to connect with patients for Virtual Visits (Telemedicine).  Patients are able to view lab/test results, encounter notes, upcoming appointments, etc.  Non-urgent messages can be sent to your provider as well.   To learn  more about what you can do with MyChart, go to [email or web address].    Your next appointment:   6 month(s)  The format for your next appointment:   In Person  Provider:   Mark Skains, MD   Thank you for choosing [facility]!!      

## 2019-09-16 DIAGNOSIS — B079 Viral wart, unspecified: Secondary | ICD-10-CM | POA: Diagnosis not present

## 2019-09-16 DIAGNOSIS — D485 Neoplasm of uncertain behavior of skin: Secondary | ICD-10-CM | POA: Diagnosis not present

## 2019-09-16 DIAGNOSIS — L219 Seborrheic dermatitis, unspecified: Secondary | ICD-10-CM | POA: Diagnosis not present

## 2019-09-16 DIAGNOSIS — L570 Actinic keratosis: Secondary | ICD-10-CM | POA: Diagnosis not present

## 2019-09-19 ENCOUNTER — Ambulatory Visit (INDEPENDENT_AMBULATORY_CARE_PROVIDER_SITE_OTHER): Payer: Medicare Other

## 2019-09-19 DIAGNOSIS — R002 Palpitations: Secondary | ICD-10-CM

## 2019-10-21 DIAGNOSIS — R197 Diarrhea, unspecified: Secondary | ICD-10-CM | POA: Diagnosis not present

## 2019-10-21 DIAGNOSIS — R002 Palpitations: Secondary | ICD-10-CM | POA: Diagnosis not present

## 2019-10-21 DIAGNOSIS — I10 Essential (primary) hypertension: Secondary | ICD-10-CM | POA: Diagnosis not present

## 2019-11-29 DIAGNOSIS — H353211 Exudative age-related macular degeneration, right eye, with active choroidal neovascularization: Secondary | ICD-10-CM | POA: Diagnosis not present

## 2019-11-29 DIAGNOSIS — H35372 Puckering of macula, left eye: Secondary | ICD-10-CM | POA: Diagnosis not present

## 2019-11-29 DIAGNOSIS — H353223 Exudative age-related macular degeneration, left eye, with inactive scar: Secondary | ICD-10-CM | POA: Diagnosis not present

## 2019-11-29 DIAGNOSIS — H35423 Microcystoid degeneration of retina, bilateral: Secondary | ICD-10-CM | POA: Diagnosis not present

## 2019-12-02 DIAGNOSIS — E871 Hypo-osmolality and hyponatremia: Secondary | ICD-10-CM | POA: Diagnosis not present

## 2019-12-02 DIAGNOSIS — I10 Essential (primary) hypertension: Secondary | ICD-10-CM | POA: Diagnosis not present

## 2019-12-02 DIAGNOSIS — D649 Anemia, unspecified: Secondary | ICD-10-CM | POA: Diagnosis not present

## 2019-12-02 DIAGNOSIS — R2689 Other abnormalities of gait and mobility: Secondary | ICD-10-CM | POA: Diagnosis not present

## 2019-12-08 ENCOUNTER — Other Ambulatory Visit: Payer: Self-pay | Admitting: Internal Medicine

## 2019-12-13 ENCOUNTER — Telehealth: Payer: Self-pay | Admitting: Internal Medicine

## 2019-12-13 DIAGNOSIS — M542 Cervicalgia: Secondary | ICD-10-CM | POA: Insufficient documentation

## 2019-12-13 DIAGNOSIS — M25512 Pain in left shoulder: Secondary | ICD-10-CM | POA: Diagnosis not present

## 2019-12-13 MED ORDER — HYDROCODONE-HOMATROPINE 5-1.5 MG/5ML PO SYRP: 5.0000 mL | ORAL_SOLUTION | Freq: Four times a day (QID) | ORAL | 0 refills | Status: DC | PRN

## 2019-12-13 NOTE — Telephone Encounter (Signed)
Called and spoke with patient letting her know that Dr. Annamaria Boots refilled her cough syrup. She expressed understanding. Nothing further needed at this time.

## 2019-12-13 NOTE — Telephone Encounter (Signed)
Primary Pulmonologist: Dr. Annamaria Boots Last office visit and with whom: 02/16/2019 Dr. Annamaria Boots What do we see them for (pulmonary problems): Bronchiectasis Last OV assessment/plan: See Below  Was appointment offered to patient (explain)?  No   Reason for call: Patient's cough started getting worse yesterday. Dry cough. Denies fevers, Was doing pulmicort neb treatment when I called. She has also used her rescue inhaler and states that she is doing the Formoterol nebs as well and not using flutter.   (examples of things to ask: : When did symptoms start? Fever? Cough? Productive? Color to sputum? More sputum than usual? Wheezing? Have you needed increased oxygen? Are you taking your respiratory medications? What over the counter measures have you tried?)  Allergies  Allergen Reactions  . Welchol [Colesevelam Hcl] Hives  . Glucosamine Other (See Comments)    Unknown  . Tramadol Nausea Only  . Celecoxib Hives, Itching and Rash  . Statins Rash    Immunization History  Administered Date(s) Administered  . Fluad Quad(high Dose 65+) 12/24/2018  . Influenza Split 01/21/2011, 01/02/2012, 01/20/2013, 01/21/2016, 01/20/2017  . Influenza Whole 02/02/2010  . Influenza, High Dose Seasonal PF 01/20/2018  . Influenza,inj,Quad PF,6+ Mos 01/16/2015  . Influenza-Unspecified 12/21/2013  . Pneumococcal Conjugate-13 09/08/2013  . Pneumococcal Polysaccharide-23 07/24/2009, 02/16/2019  . Tdap 01/06/2013    Assessment & Plan Note by Deneise Lever, MD at 02/16/2019 3:16 PM Author: Deneise Lever, MD Author Type: Physician Filed: 02/16/2019 3:16 PM  Note Status: Written Cosign: Cosign Not Required Encounter Date: 02/16/2019  Problem: Low back pain radiating down leg  Editor: Deneise Lever, MD (Physician)               She is planning return to Ortho for steroid inj. Very limiting, adding to debilitation.     Assessment & Plan Note by Deneise Lever, MD at 02/16/2019 3:14 PM Author: Deneise Lever, MD Author Type: Physician Filed: 02/16/2019 3:15 PM  Note Status: Written Cosign: Cosign Not Required Encounter Date: 02/16/2019  Problem: Hyponatremia  Editor: Deneise Lever, MD (Physician)               Tracked and managed by PCP. Admit to drinking " a lot of water". This may contribute to her weakness. Plan- drink only for thirst. Ok to salt food.     Assessment & Plan Note by Deneise Lever, MD at 02/16/2019 3:11 PM Author: Deneise Lever, MD Author Type: Physician Filed: 02/16/2019 3:14 PM  Note Status: Bernell List: Cosign Not Required Encounter Date: 02/16/2019  Problem: Community acquired pneumonia of left lower lobe of lung ([place])  Editor: Deneise Lever, MD (Physician)      Prior Versions: 1. Deneise Lever, MD (Physician) at 02/16/2019 3:12 PM - Written    Resolved by her report of [facility] done recently. LLL is are of chronic scarring/ bronchiectasis. Have repeatedly explored possibility of aspitration with her. She asked about pneumonia vax. After discussion, agreed to update Pneumovax-23.    Patient Instructions by Deneise Lever, MD at 02/16/2019 11:30 AM Author: Deneise Lever, MD Author Type: Physician Filed: 02/16/2019 12:13 PM  Note Status: Addendum Mickle Mallory: Cosign Not Required Encounter Date: 02/16/2019  Editor: Deneise Lever, MD (Physician)      Prior Versions: 1. Deneise Lever, MD (Physician) at 02/16/2019 12:11 PM - Signed    Follow Dr Eddie Dibbles advice about managing your sodium level  Order- Pneumovax-23 pneumonia vaccine  Please call if we can  help

## 2019-12-13 NOTE — Telephone Encounter (Signed)
Hydrocodone cough syrup refilled as requested

## 2019-12-21 ENCOUNTER — Ambulatory Visit: Payer: Medicare Other | Admitting: Cardiology

## 2019-12-23 ENCOUNTER — Emergency Department (HOSPITAL_BASED_OUTPATIENT_CLINIC_OR_DEPARTMENT_OTHER): Payer: Medicare Other

## 2019-12-23 ENCOUNTER — Inpatient Hospital Stay (HOSPITAL_BASED_OUTPATIENT_CLINIC_OR_DEPARTMENT_OTHER)
Admission: EM | Admit: 2019-12-23 | Discharge: 2019-12-26 | DRG: 640 | Disposition: A | Discharge status: Home Health | Payer: Medicare Other | Attending: Internal Medicine | Admitting: Internal Medicine

## 2019-12-23 ENCOUNTER — Other Ambulatory Visit: Payer: Self-pay

## 2019-12-23 ENCOUNTER — Encounter (HOSPITAL_BASED_OUTPATIENT_CLINIC_OR_DEPARTMENT_OTHER): Payer: Self-pay | Admitting: *Deleted

## 2019-12-23 DIAGNOSIS — R197 Diarrhea, unspecified: Secondary | ICD-10-CM | POA: Diagnosis not present

## 2019-12-23 DIAGNOSIS — Z66 Do not resuscitate: Secondary | ICD-10-CM | POA: Diagnosis present

## 2019-12-23 DIAGNOSIS — R350 Frequency of micturition: Secondary | ICD-10-CM | POA: Diagnosis not present

## 2019-12-23 DIAGNOSIS — E860 Dehydration: Secondary | ICD-10-CM | POA: Diagnosis not present

## 2019-12-23 DIAGNOSIS — R3915 Urgency of urination: Secondary | ICD-10-CM | POA: Diagnosis not present

## 2019-12-23 DIAGNOSIS — E871 Hypo-osmolality and hyponatremia: Secondary | ICD-10-CM | POA: Diagnosis present

## 2019-12-23 DIAGNOSIS — N3000 Acute cystitis without hematuria: Secondary | ICD-10-CM | POA: Diagnosis not present

## 2019-12-23 DIAGNOSIS — Z20822 Contact with and (suspected) exposure to covid-19: Secondary | ICD-10-CM | POA: Diagnosis not present

## 2019-12-23 DIAGNOSIS — R9431 Abnormal electrocardiogram [ECG] [EKG]: Secondary | ICD-10-CM

## 2019-12-23 DIAGNOSIS — G4700 Insomnia, unspecified: Secondary | ICD-10-CM | POA: Diagnosis present

## 2019-12-23 DIAGNOSIS — Z888 Allergy status to other drugs, medicaments and biological substances status: Secondary | ICD-10-CM | POA: Diagnosis not present

## 2019-12-23 DIAGNOSIS — I160 Hypertensive urgency: Secondary | ICD-10-CM | POA: Diagnosis not present

## 2019-12-23 DIAGNOSIS — I10 Essential (primary) hypertension: Secondary | ICD-10-CM | POA: Diagnosis not present

## 2019-12-23 DIAGNOSIS — R911 Solitary pulmonary nodule: Secondary | ICD-10-CM | POA: Diagnosis not present

## 2019-12-23 DIAGNOSIS — Z7951 Long term (current) use of inhaled steroids: Secondary | ICD-10-CM

## 2019-12-23 DIAGNOSIS — J189 Pneumonia, unspecified organism: Secondary | ICD-10-CM | POA: Diagnosis present

## 2019-12-23 DIAGNOSIS — K5900 Constipation, unspecified: Secondary | ICD-10-CM | POA: Diagnosis present

## 2019-12-23 DIAGNOSIS — Z79899 Other long term (current) drug therapy: Secondary | ICD-10-CM

## 2019-12-23 DIAGNOSIS — Z85828 Personal history of other malignant neoplasm of skin: Secondary | ICD-10-CM | POA: Diagnosis not present

## 2019-12-23 DIAGNOSIS — E119 Type 2 diabetes mellitus without complications: Secondary | ICD-10-CM | POA: Diagnosis not present

## 2019-12-23 DIAGNOSIS — R300 Dysuria: Secondary | ICD-10-CM | POA: Diagnosis not present

## 2019-12-23 DIAGNOSIS — Z825 Family history of asthma and other chronic lower respiratory diseases: Secondary | ICD-10-CM

## 2019-12-23 DIAGNOSIS — R0602 Shortness of breath: Secondary | ICD-10-CM | POA: Diagnosis not present

## 2019-12-23 DIAGNOSIS — R531 Weakness: Secondary | ICD-10-CM | POA: Diagnosis not present

## 2019-12-23 DIAGNOSIS — Z7984 Long term (current) use of oral hypoglycemic drugs: Secondary | ICD-10-CM

## 2019-12-23 DIAGNOSIS — Z9049 Acquired absence of other specified parts of digestive tract: Secondary | ICD-10-CM | POA: Diagnosis not present

## 2019-12-23 DIAGNOSIS — Z923 Personal history of irradiation: Secondary | ICD-10-CM

## 2019-12-23 DIAGNOSIS — J440 Chronic obstructive pulmonary disease with acute lower respiratory infection: Secondary | ICD-10-CM | POA: Diagnosis present

## 2019-12-23 DIAGNOSIS — N390 Urinary tract infection, site not specified: Secondary | ICD-10-CM

## 2019-12-23 LAB — URINALYSIS, MICROSCOPIC (REFLEX)

## 2019-12-23 LAB — CBC WITH DIFFERENTIAL/PLATELET
Abs Immature Granulocytes: 0.09 10*3/uL — ABNORMAL HIGH (ref 0.00–0.07)
Basophils Absolute: 0.1 10*3/uL (ref 0.0–0.1)
Basophils Relative: 1 %
Eosinophils Absolute: 0.1 10*3/uL (ref 0.0–0.5)
Eosinophils Relative: 1 %
HCT: 34.8 % — ABNORMAL LOW (ref 36.0–46.0)
Hemoglobin: 11.3 g/dL — ABNORMAL LOW (ref 12.0–15.0)
Immature Granulocytes: 1 %
Lymphocytes Relative: 21 %
Lymphs Abs: 1.9 10*3/uL (ref 0.7–4.0)
MCH: 27.8 pg (ref 26.0–34.0)
MCHC: 32.5 g/dL (ref 30.0–36.0)
MCV: 85.5 fL (ref 80.0–100.0)
Monocytes Absolute: 1.1 10*3/uL — ABNORMAL HIGH (ref 0.1–1.0)
Monocytes Relative: 12 %
Neutro Abs: 6.1 10*3/uL (ref 1.7–7.7)
Neutrophils Relative %: 64 %
Platelets: 394 10*3/uL (ref 150–400)
RBC: 4.07 MIL/uL (ref 3.87–5.11)
RDW: 12.9 % (ref 11.5–15.5)
WBC: 9.4 10*3/uL (ref 4.0–10.5)
nRBC: 0 % (ref 0.0–0.2)

## 2019-12-23 LAB — COMPREHENSIVE METABOLIC PANEL
ALT: 11 U/L (ref 0–44)
AST: 16 U/L (ref 15–41)
Albumin: 3.5 g/dL (ref 3.5–5.0)
Alkaline Phosphatase: 53 U/L (ref 38–126)
Anion gap: 13 (ref 5–15)
BUN: 21 mg/dL (ref 8–23)
CO2: 24 mmol/L (ref 22–32)
Calcium: 9.3 mg/dL (ref 8.9–10.3)
Chloride: 87 mmol/L — ABNORMAL LOW (ref 98–111)
Creatinine, Ser: 0.77 mg/dL (ref 0.44–1.00)
GFR calc Af Amer: >60 mL/min (ref >60)
GFR calc non Af Amer: >60 mL/min (ref >60)
Glucose, Bld: 200 mg/dL — ABNORMAL HIGH (ref 70–99)
Potassium: 3.8 mmol/L (ref 3.5–5.1)
Sodium: 124 mmol/L — ABNORMAL LOW (ref 135–145)
Total Bilirubin: 0.3 mg/dL (ref 0.3–1.2)
Total Protein: 6.2 g/dL — ABNORMAL LOW (ref 6.5–8.1)

## 2019-12-23 LAB — URINALYSIS, ROUTINE W REFLEX MICROSCOPIC
Bilirubin Urine: NEGATIVE
Glucose, UA: NEGATIVE mg/dL
Hgb urine dipstick: NEGATIVE
Ketones, ur: NEGATIVE mg/dL
Nitrite: NEGATIVE
Protein, ur: 30 mg/dL — AB
Specific Gravity, Urine: 1.01 (ref 1.005–1.030)
pH: 6 (ref 5.0–8.0)

## 2019-12-23 LAB — MAGNESIUM: Magnesium: 1.1 mg/dL — ABNORMAL LOW (ref 1.7–2.4)

## 2019-12-23 LAB — SARS CORONAVIRUS 2 BY RT PCR (HOSPITAL ORDER, PERFORMED IN ~~LOC~~ HOSPITAL LAB): SARS Coronavirus 2: NEGATIVE

## 2019-12-23 LAB — BASIC METABOLIC PANEL
Anion gap: 10 (ref 5–15)
BUN: 17 mg/dL (ref 8–23)
CO2: 28 mmol/L (ref 22–32)
Calcium: 8.6 mg/dL — ABNORMAL LOW (ref 8.9–10.3)
Chloride: 92 mmol/L — ABNORMAL LOW (ref 98–111)
Creatinine, Ser: 0.59 mg/dL (ref 0.44–1.00)
GFR calc Af Amer: >60 mL/min (ref >60)
GFR calc non Af Amer: >60 mL/min (ref >60)
Glucose, Bld: 127 mg/dL — ABNORMAL HIGH (ref 70–99)
Potassium: 3.7 mmol/L (ref 3.5–5.1)
Sodium: 130 mmol/L — ABNORMAL LOW (ref 135–145)

## 2019-12-23 LAB — TROPONIN I (HIGH SENSITIVITY): Troponin I (High Sensitivity): 14 ng/L (ref <18)

## 2019-12-23 MED ORDER — IPRATROPIUM BROMIDE 0.02 % IN SOLN
0.5000 mg | Freq: Three times a day (TID) | RESPIRATORY_TRACT | Status: DC
  Administered 2019-12-23: 0.5 mg via RESPIRATORY_TRACT
  Filled 2019-12-23: qty 2.5

## 2019-12-23 MED ORDER — METFORMIN HCL 500 MG PO TABS
1000.0000 mg | ORAL_TABLET | Freq: Two times a day (BID) | ORAL | Status: DC
  Administered 2019-12-23: 1000 mg via ORAL
  Filled 2019-12-23: qty 2

## 2019-12-23 MED ORDER — CYCLOSPORINE 0.05 % OP EMUL
1.0000 [drp] | Freq: Every evening | OPHTHALMIC | Status: DC | PRN
  Filled 2019-12-23: qty 1

## 2019-12-23 MED ORDER — ACETAMINOPHEN 325 MG PO TABS
650.0000 mg | ORAL_TABLET | Freq: Four times a day (QID) | ORAL | Status: DC | PRN
  Administered 2019-12-23: 325 mg via ORAL
  Administered 2019-12-24: 650 mg via ORAL
  Administered 2019-12-24: 325 mg via ORAL
  Administered 2019-12-25 – 2019-12-26 (×3): 650 mg via ORAL
  Filled 2019-12-23 (×6): qty 2

## 2019-12-23 MED ORDER — LOSARTAN POTASSIUM 50 MG PO TABS: 100.0000 mg | ORAL_TABLET | Freq: Every day | ORAL | Status: DC

## 2019-12-23 MED ORDER — SODIUM CHLORIDE 1 G PO TABS
1.0000 g | ORAL_TABLET | Freq: Two times a day (BID) | ORAL | Status: DC
  Filled 2019-12-23: qty 1

## 2019-12-23 MED ORDER — PANTOPRAZOLE SODIUM 40 MG PO TBEC
40.0000 mg | DELAYED_RELEASE_TABLET | Freq: Every day | ORAL | Status: DC
  Administered 2019-12-23: 40 mg via ORAL
  Filled 2019-12-23: qty 1

## 2019-12-23 MED ORDER — SODIUM CHLORIDE 0.9 % IV SOLN
INTRAVENOUS | Status: DC | PRN
  Administered 2019-12-23: 250 mL via INTRAVENOUS

## 2019-12-23 MED ORDER — GUAIFENESIN 100 MG/5ML PO SOLN
10.0000 mL | Freq: Once | ORAL | Status: AC
  Administered 2019-12-23: 200 mg via ORAL
  Filled 2019-12-23: qty 10

## 2019-12-23 MED ORDER — ONDANSETRON HCL 4 MG/2ML IJ SOLN: 4.0000 mg | Freq: Four times a day (QID) | INTRAMUSCULAR | Status: DC | PRN

## 2019-12-23 MED ORDER — SODIUM CHLORIDE 0.9 % IV SOLN
1.0000 g | Freq: Once | INTRAVENOUS | Status: AC
  Administered 2019-12-23: 1 g via INTRAVENOUS
  Filled 2019-12-23: qty 10

## 2019-12-23 MED ORDER — SODIUM CHLORIDE 0.9 % IV BOLUS
500.0000 mL | Freq: Once | INTRAVENOUS | Status: AC
  Administered 2019-12-23: 500 mL via INTRAVENOUS

## 2019-12-23 MED ORDER — ALPRAZOLAM 0.25 MG PO TABS
0.2500 mg | ORAL_TABLET | Freq: Two times a day (BID) | ORAL | Status: DC | PRN
  Administered 2019-12-24: 0.25 mg via ORAL
  Filled 2019-12-23: qty 1

## 2019-12-23 MED ORDER — ALBUTEROL SULFATE HFA 108 (90 BASE) MCG/ACT IN AERS
2.0000 | INHALATION_SPRAY | Freq: Four times a day (QID) | RESPIRATORY_TRACT | Status: DC | PRN
  Filled 2019-12-23: qty 6.7

## 2019-12-23 MED ORDER — SODIUM CHLORIDE 0.9 % IV SOLN: INTRAVENOUS | Status: AC

## 2019-12-23 MED ORDER — SODIUM CHLORIDE 0.9 % IV SOLN
1.0000 g | INTRAVENOUS | Status: DC
  Administered 2019-12-24: 1 g via INTRAVENOUS
  Filled 2019-12-23: qty 1
  Filled 2019-12-23: qty 10

## 2019-12-23 MED ORDER — SODIUM CHLORIDE 0.9 % IV SOLN
500.0000 mg | INTRAVENOUS | Status: DC
  Administered 2019-12-24: 500 mg via INTRAVENOUS
  Filled 2019-12-23 (×3): qty 500

## 2019-12-23 MED ORDER — ALPRAZOLAM 0.5 MG PO TABS: 0.2500 mg | ORAL_TABLET | Freq: Two times a day (BID) | ORAL | Status: DC | PRN

## 2019-12-23 MED ORDER — SODIUM CHLORIDE 0.9 % IV SOLN
500.0000 mg | Freq: Once | INTRAVENOUS | Status: AC
  Administered 2019-12-23: 500 mg via INTRAVENOUS
  Filled 2019-12-23: qty 500

## 2019-12-23 NOTE — ED Triage Notes (Signed)
She was seen by her MD to be seen be seen for weakness. She feels her sodium is low. She was diagnosed with a UTI today.

## 2019-12-23 NOTE — ED Provider Notes (Signed)
[facility] Provider Note   CSN: [id number] Arrival date & time: 12/23/19  1624     History Chief Complaint  Patient presents with  . Weakness    Katie Alvarado is a 86 y.o. female.  HPI   86 year old female with a history of COPD, skin cancer, diabetes, hypertension, hyponatremia per her report, who presents to the emergency department today complaining of generalized weakness.  She states that for the last week she has been so weak that it is hard for her to walk.  She states she feels like her sodium is low and she has been treated for this multiple times in the past.  She saw her PCP today and was diagnosed with a urinary tract infection.  She has been having some urinary frequency and dysuria for the last week as well.  She denies any nausea or vomiting.  She does admit to having alternating diarrhea/constipation for the last several months.  She denies any chest pain or any new shortness of breath.  She has a chronic cough at baseline which she states she is actually recently improved because she took a course of prednisone due to left shoulder pain.  She denies any fevers.  Past Medical History:  Diagnosis Date  . Acute nasopharyngitis (common cold)   . Arthritis   . Bronchiectasis with acute exacerbation ([place])   . Cancer ([place])    skin cancer  . COPD (chronic obstructive pulmonary disease) ([place])    sees dr. Annamaria Boots   . Cough   . Diabetes mellitus    fasting 130-150  . Dizziness   . Esophageal reflux   . History of radiation therapy 10/24/17- 12/05/17   Lower lip, 4.4 Gy X 10 fractions given twice weekly for a total dose of 44 Gy.  Marland Kitchen Hypertension   . Insomnia, unspecified   . Other symptoms involving nervous and musculoskeletal systems(781.99)   . Pneumonia    hx of  . Shortness of breath    exertion    Patient Active Problem List   Diagnosis Date Noted  . AKI (acute kidney injury) ([place]) 01/10/2019  . Community acquired pneumonia of left  lower lobe of lung 01/07/2019  . Thrombocytosis ([place]) 12/25/2018  . Left-sided chest wall pain 06/01/2018  . Back pain 05/05/2018  . Low back pain radiating down leg 05/05/2018  . Low back pain 05/05/2018  . Pneumonia of left lower lobe due to infectious organism 04/28/2018  . 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 Allergic urticaria 03/05/2016  . Gastritis 03/05/2016  . Chronic rhinitis 01/09/2016  . Pruritus 09/05/2015  . 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 Anemia 07/18/2015  . Bronchiectasis without complication ([place]) 54/00/8676  . Renal artery stenosis ([place]) 04/25/2015  . Essential hypertension 04/25/2015  . Lower extremity pain, bilateral 08/04/2012  . Diarrhea 08/03/2012  . Klebsiella infection 08/01/2012  . DM type 2 (diabetes mellitus, type 2) ([place]) 07/29/2012  . Generalized weakness 07/29/2012  . Weakness 04/11/2011  . Hyponatremia 04/11/2011  . UTI (lower urinary tract infection) 04/11/2011  . ARF (acute renal failure) ([place]) 04/11/2011  . Leucocytosis 04/11/2011  . Dehydration 04/11/2011  . Hypotension 04/11/2011  . BRONCHIECTASIS, + [facility] 03/25/2010  . DIZZINESS 02/02/2010  . MUSCULOSKELETAL PAIN 09/25/2007  . INSOMNIA 05/20/2007  . ACUTE NASOPHARYNGITIS 04/20/2007  . Bronchiectasis with acute exacerbation ([place]) 04/20/2007  . Esophageal reflux 04/20/2007    Past Surgical History:  Procedure Laterality Date  . ABDOMINAL HYSTERECTOMY    .  ANKLE FRACTURE SURGERY Left   . APPENDECTOMY    . BASAL CELL CARCINOMA EXCISION     NOSE  . CATARACT EXTRACTION, BILATERAL     OUT PATIENT  . COLON SURGERY     colon resection for diverticulitis  . IR EPIDUROGRAPHY  05/07/2018  . LUMBAR LAMINECTOMY/DECOMPRESSION MICRODISCECTOMY N/A 02/15/2013   Procedure: LUMBAR LAMINECTOMY/DECOMPRESSION MICRODISCECTOMY LUMBAR FOUR-FIVE;  Surgeon: Otilio Connors, MD;  Location: [facility];  Service: Neurosurgery;  Laterality: N/A;  . VARICOSE VEIN SURGERY       OB  History   No obstetric history on file.     Family History  Problem Relation Age of Onset  . Chronic bronchitis Father   . Other Sister        heart trouble  . Other Brother        heart trouble    Social History   Tobacco Use  . Smoking status: Never Smoker  . Smokeless tobacco: Never Used  Vaping Use  . Vaping Use: Never used  Substance Use Topics  . Alcohol use: Yes    Comment: "Hardly Ever"   . Drug use: No    Home Medications Prior to Admission medications   Medication Sig Start Date End Date Taking? Authorizing Provider  acetaminophen (TYLENOL) 325 MG tablet Take 2 tablets (650 mg total) by mouth every 6 (six) hours as needed for pain. Patient taking differently: Take 650 mg by mouth every 6 (six) hours as needed.  08/05/12   Eugenie Filler, MD  ALPRAZolam Duanne Moron) 0.25 MG tablet Take 0.25 mg by mouth 2 (two) times daily as needed for sleep or anxiety.     [provider]  Artificial Tear Ointment (DRY EYES OP) Apply 1 drop to eye 2 (two) times daily as needed (for dry eyes).     [provider]  budesonide (PULMICORT) 0.25 MG/2ML nebulizer solution USE 1 VIAL  IN  NEBULIZER TWICE  DAILY - rinse mouth after treatment 08/02/19   Baird Lyons D, MD  cholecalciferol (VITAMIN D) 1000 UNITS tablet Take 2,000 Units by mouth 2 (two) times a week.     [provider]  Cinnamon 500 MG capsule Take 1,000 mg by mouth 2 (two) times daily as needed (for sugar levels).     [provider]  cycloSPORINE (RESTASIS) 0.05 % ophthalmic emulsion Place 1 drop into both eyes at bedtime as needed (dry eyes).     [provider]  esomeprazole (NEXIUM) 40 MG capsule Take 40 mg by mouth 2 (two) times daily before a meal.     [provider]  feeding supplement, ENSURE ENLIVE, (ENSURE ENLIVE) LIQD Take 237 mLs by mouth 2 (two) times daily between meals. 01/10/19   Dhungel, Nishant, MD  formoterol (PERFOROMIST) 20 MCG/2ML nebulizer solution  Take 2 mLs (20 mcg total) by nebulization 2 (two) times daily. Dx: J47.9 01/19/19   Parrett, Fonnie Mu, NP  guaiFENesin-dextromethorphan (ROBITUSSIN DM) 100-10 MG/5ML syrup Take 5 mLs by mouth every 4 (four) hours as needed for cough. 01/10/19   Dhungel, Nishant, MD  HYDROcodone-homatropine (HYCODAN) 5-1.5 MG/5ML syrup Take 5 mLs by mouth every 6 (six) hours as needed for cough. 12/13/19   Baird Lyons D, MD  ipratropium (ATROVENT) 0.02 % nebulizer solution USE 1 VIAL IN NEBULIZER 4 TIMES DAILY 08/02/19   Baird Lyons D, MD  losartan (COZAAR) 100 MG tablet Take 100 mg by mouth daily. 09/11/19   [provider]  meclizine (ANTIVERT) 25 MG tablet  Take 25 mg by mouth every 6 (six) hours as needed for dizziness.  03/14/15   [provider]  metFORMIN (GLUCOPHAGE) 500 MG tablet Take 1,000 mg by mouth 2 (two) times daily.     [provider]  ONE TOUCH ULTRA TEST test strip USE TO CHECK BLOOD SUGAR ONCE DAILY 90 01/04/16   [provider]  [facility] DELICA LANCETS 88C MISC USE TO CHECK BLOOD SUGAR ONCE DAILY AS DIRECTED 01/04/16   [provider]  PROAIR HFA 108 (90 Base) MCG/ACT inhaler INHALE 2 PUFFS INTO THE LUNGS EVERY 6 HOURS AS NEEDED FOR WHEEZING OR SHORTNESS OF BREATH 12/09/19   Deneise Lever, MD  Respiratory Therapy Supplies (FLUTTER) DEVI Use as directed 11/03/15   Baird Lyons D, MD  rosuvastatin (CRESTOR) 5 MG tablet Take 5 mg by mouth once a week. 01/09/19   [provider]  traMADol (ULTRAM) 50 MG tablet Take 0.5 tablets (25 mg total) by mouth every 6 (six) hours as needed for moderate pain. 05/08/18   Georgette Shell, MD  vitamin E 400 UNIT capsule Take 400 Units by mouth 2 (two) times a week.     [provider]    Allergies    Welchol [colesevelam hcl], Glucosamine, Tramadol, Celecoxib, and Statins  Review of Systems   Review of Systems  Constitutional: Negative for fever.  HENT: Negative for ear pain and sore throat.     Eyes: Negative for visual disturbance.  Respiratory: Positive for cough (chornic, improved) and shortness of breath (chronic, unchanged).   Cardiovascular: Negative for chest pain.  Gastrointestinal: Positive for constipation and diarrhea. Negative for abdominal pain, nausea and vomiting.  Genitourinary: Positive for dysuria and frequency. Negative for hematuria.  Musculoskeletal: Negative for back pain.  Skin: Negative for color change and rash.  Neurological: Positive for weakness. Negative for speech difficulty and numbness.  All other systems reviewed and are negative.   Physical Exam Updated Vital Signs BP (!) 156/87   Pulse 92   Temp 97.9 F (36.6 C) (Oral)   Resp (!) 24   Ht 5\' 8"  (1.727 m)   Wt 61.6 kg   SpO2 97%   BMI 20.65 kg/m   Physical Exam Vitals and nursing note reviewed.  Constitutional:      General: She is not in acute distress.    Appearance: She is well-developed.  HENT:     Head: Normocephalic and atraumatic.  Eyes:     Conjunctiva/sclera: Conjunctivae normal.  Cardiovascular:     Rate and Rhythm: Normal rate and regular rhythm.     Heart sounds: Normal heart sounds. No murmur heard.   Pulmonary:     Effort: Pulmonary effort is normal. No respiratory distress.     Breath sounds: Rales (LLL) present. No wheezing or rhonchi.  Abdominal:     General: Bowel sounds are normal.     Palpations: Abdomen is soft.     Tenderness: There is no abdominal tenderness. There is no guarding or rebound.  Musculoskeletal:     Cervical back: Neck supple.  Skin:    General: Skin is warm and dry.  Neurological:     Mental Status: She is alert.     Comments: Mental Status:  Alert, thought content appropriate, able to give a coherent history. Speech fluent without evidence of aphasia. Able to follow 2 step commands without difficulty. No facial droop.  Motor:  Normal tone. 5/5 strength of BUE and BLE major muscle groups including strong and equal  grip strength and  dorsiflexion/plantar flexion Sensory: light touch normal in all extremities.      ED Results / Procedures / Treatments   Labs (all labs ordered are listed, but only abnormal results are displayed) Labs Reviewed  CBC WITH DIFFERENTIAL/PLATELET - Abnormal; Notable for the following components:      Result Value   Hemoglobin 11.3 (*)    HCT 34.8 (*)    Monocytes Absolute 1.1 (*)    Abs Immature Granulocytes 0.09 (*)    All other components within normal limits  COMPREHENSIVE METABOLIC PANEL - Abnormal; Notable for the following components:   Sodium 124 (*)    Chloride 87 (*)    Glucose, Bld 200 (*)    Total Protein 6.2 (*)    All other components within normal limits  URINE CULTURE  SARS CORONAVIRUS 2 BY RT PCR (HOSPITAL ORDER, [facility])  URINALYSIS, ROUTINE W REFLEX MICROSCOPIC  TROPONIN I (HIGH SENSITIVITY)    EKG EKG Interpretation  Date/Time:  Thursday December 23 2019 17:54:49 EDT Ventricular Rate:  81 PR Interval:    QRS Duration: 93 QT Interval:  410 QTC Calculation: 476 R Axis:   -32 Text Interpretation: Sinus rhythm Left axis deviation Abnormal R-wave progression, early transition Abnormal T, consider ischemia, diffuse leads No STEMI Confirmed by Octaviano Glow [phone]) on 12/23/2019 6:06:52 PM   Radiology DG Chest Portable 1 View  Result Date: 12/23/2019 CLINICAL DATA:  Generalized weakness. EXAM: PORTABLE CHEST 1 VIEW COMPARISON:  February 11, 2019 FINDINGS: The lungs are hyperinflated. Mild diffuse chronic appearing increased lung markings are seen. Mild atelectasis and/or infiltrate is seen within the left lung base. Mild linear scarring and/or atelectasis is noted within the upper right lung. Several ill-defined subcentimeter nodular opacities are seen along the periphery of the mid to lower lung fields, bilaterally. These areas are present on the prior study and decreased in severity. There is no evidence of a pleural effusion or  pneumothorax. The heart size and mediastinal contours are within normal limits. There is marked severity calcification of the thoracic aorta. The visualized skeletal structures are unremarkable. IMPRESSION: 1. Mild left basilar atelectasis and/or infiltrate with mild right upper lobe linear scarring and/or atelectasis. 2. Interval decrease in size of small peripheral lung nodules since the prior study. Electronically Signed   By: Virgina Norfolk M.D.   On: 12/23/2019 17:38    Procedures Procedures (including critical care time)  Medications Ordered in ED Medications  cefTRIAXone (ROCEPHIN) 1 g in sodium chloride 0.9 % 100 mL IVPB (has no administration in time range)  azithromycin (ZITHROMAX) 500 mg in sodium chloride 0.9 % 250 mL IVPB (has no administration in time range)  sodium chloride 0.9 % bolus 500 mL (500 mLs Intravenous New Bag/Given 12/23/19 1811)    ED Course  I have reviewed the triage vital signs and the nursing notes.  Pertinent labs & imaging results that were available during my care of the patient were reviewed by me and considered in my medical decision making (see chart for details).    MDM Rules/Calculators/A&P                          86 year old female presenting for evaluation of generalized weakness.  Was seen at PCP office earlier today and diagnosed with a urinary tract infection.  She came here due to the generalized weakness and concern for possible hyponatremia.  Reviewed records and I am unable to  find results of urinalysis in the chart.  Will obtain labs, repeat UA and add culture, get chest x-ray and EKG.  Reviewed/interpreted lab CBC w/o leukocytosis or anemia CMP with hyponatremia and hypochloremia at 124 and 87, cr and lfts are wnl. Trop pending COVID pending UA pending  EKG with Sinus rhythm Left axis deviation Abnormal R-wave progression, early transition Abnormal T, consider ischemia, diffuse leads No STEMI  Chest x-ray reviewed/interpreted -  LLL infiltrate noted  Pt given a dose of ceftriaxone and azithromycin to cover her pna and suspected UTI. She was also given a small IVF bolus.   At shift change, she is still pending trop, covid and ua. Care transitioned to Dr. Langston Masker with plan to f/u on pending labs and determine disposition.   Final Clinical Impression(s) / ED Diagnoses Final diagnoses:  Hyponatremia  Community acquired pneumonia of left lower lobe of lung  Acute cystitis without hematuria    Rx / DC Orders ED Discharge Orders    None       Bishop Dublin 12/23/19 1843    Wyvonnia Dusky, MD 12/24/19 [phone]

## 2019-12-24 ENCOUNTER — Observation Stay (HOSPITAL_COMMUNITY): Payer: Medicare Other

## 2019-12-24 DIAGNOSIS — Z9049 Acquired absence of other specified parts of digestive tract: Secondary | ICD-10-CM | POA: Diagnosis not present

## 2019-12-24 DIAGNOSIS — E871 Hypo-osmolality and hyponatremia: Principal | ICD-10-CM

## 2019-12-24 DIAGNOSIS — I10 Essential (primary) hypertension: Secondary | ICD-10-CM | POA: Diagnosis present

## 2019-12-24 DIAGNOSIS — J440 Chronic obstructive pulmonary disease with acute lower respiratory infection: Secondary | ICD-10-CM | POA: Diagnosis present

## 2019-12-24 DIAGNOSIS — Z66 Do not resuscitate: Secondary | ICD-10-CM | POA: Diagnosis present

## 2019-12-24 DIAGNOSIS — Z825 Family history of asthma and other chronic lower respiratory diseases: Secondary | ICD-10-CM | POA: Diagnosis not present

## 2019-12-24 DIAGNOSIS — Z7951 Long term (current) use of inhaled steroids: Secondary | ICD-10-CM | POA: Diagnosis not present

## 2019-12-24 DIAGNOSIS — Z7984 Long term (current) use of oral hypoglycemic drugs: Secondary | ICD-10-CM | POA: Diagnosis not present

## 2019-12-24 DIAGNOSIS — Z79899 Other long term (current) drug therapy: Secondary | ICD-10-CM | POA: Diagnosis not present

## 2019-12-24 DIAGNOSIS — R9431 Abnormal electrocardiogram [ECG] [EKG]: Secondary | ICD-10-CM

## 2019-12-24 DIAGNOSIS — Z85828 Personal history of other malignant neoplasm of skin: Secondary | ICD-10-CM | POA: Diagnosis not present

## 2019-12-24 DIAGNOSIS — E119 Type 2 diabetes mellitus without complications: Secondary | ICD-10-CM | POA: Diagnosis present

## 2019-12-24 DIAGNOSIS — Z888 Allergy status to other drugs, medicaments and biological substances status: Secondary | ICD-10-CM | POA: Diagnosis not present

## 2019-12-24 DIAGNOSIS — E860 Dehydration: Secondary | ICD-10-CM | POA: Diagnosis present

## 2019-12-24 DIAGNOSIS — N3000 Acute cystitis without hematuria: Secondary | ICD-10-CM | POA: Diagnosis present

## 2019-12-24 DIAGNOSIS — J189 Pneumonia, unspecified organism: Secondary | ICD-10-CM | POA: Diagnosis present

## 2019-12-24 DIAGNOSIS — K5900 Constipation, unspecified: Secondary | ICD-10-CM | POA: Diagnosis present

## 2019-12-24 DIAGNOSIS — Z923 Personal history of irradiation: Secondary | ICD-10-CM | POA: Diagnosis not present

## 2019-12-24 DIAGNOSIS — Z20822 Contact with and (suspected) exposure to covid-19: Secondary | ICD-10-CM | POA: Diagnosis present

## 2019-12-24 DIAGNOSIS — G4700 Insomnia, unspecified: Secondary | ICD-10-CM | POA: Diagnosis present

## 2019-12-24 DIAGNOSIS — R197 Diarrhea, unspecified: Secondary | ICD-10-CM | POA: Diagnosis present

## 2019-12-24 DIAGNOSIS — I160 Hypertensive urgency: Secondary | ICD-10-CM | POA: Diagnosis present

## 2019-12-24 LAB — ECHOCARDIOGRAM COMPLETE
AR max vel: 2.48 cm2
AV Area VTI: 2.4 cm2
AV Area mean vel: 2.43 cm2
AV Mean grad: 3 mmHg
AV Peak grad: 5.3 mmHg
Ao pk vel: 1.15 m/s
Area-P 1/2: 2.74 cm2
Height: 68 in
S' Lateral: 1.65 cm
Weight: 2172.85 oz

## 2019-12-24 LAB — BASIC METABOLIC PANEL
Anion gap: 11 (ref 5–15)
Anion gap: 10 (ref 5–15)
Anion gap: 11 (ref 5–15)
BUN: 8 mg/dL (ref 8–23)
BUN: 11 mg/dL (ref 8–23)
BUN: 12 mg/dL (ref 8–23)
CO2: 26 mmol/L (ref 22–32)
CO2: 24 mmol/L (ref 22–32)
CO2: 22 mmol/L (ref 22–32)
Calcium: 9.2 mg/dL (ref 8.9–10.3)
Calcium: 8.7 mg/dL — ABNORMAL LOW (ref 8.9–10.3)
Calcium: 8.6 mg/dL — ABNORMAL LOW (ref 8.9–10.3)
Chloride: 96 mmol/L — ABNORMAL LOW (ref 98–111)
Chloride: 98 mmol/L (ref 98–111)
Chloride: 100 mmol/L (ref 98–111)
Creatinine, Ser: 0.75 mg/dL (ref 0.44–1.00)
Creatinine, Ser: 1.03 mg/dL — ABNORMAL HIGH (ref 0.44–1.00)
Creatinine, Ser: 0.92 mg/dL (ref 0.44–1.00)
GFR calc Af Amer: >60 mL/min (ref >60)
GFR calc Af Amer: 55 mL/min — ABNORMAL LOW (ref >60)
GFR calc Af Amer: >60 mL/min (ref >60)
GFR calc non Af Amer: >60 mL/min (ref >60)
GFR calc non Af Amer: 47 mL/min — ABNORMAL LOW (ref >60)
GFR calc non Af Amer: 54 mL/min — ABNORMAL LOW (ref >60)
Glucose, Bld: 128 mg/dL — ABNORMAL HIGH (ref 70–99)
Glucose, Bld: 255 mg/dL — ABNORMAL HIGH (ref 70–99)
Glucose, Bld: 162 mg/dL — ABNORMAL HIGH (ref 70–99)
Potassium: 4 mmol/L (ref 3.5–5.1)
Potassium: 3.8 mmol/L (ref 3.5–5.1)
Potassium: 4 mmol/L (ref 3.5–5.1)
Sodium: 133 mmol/L — ABNORMAL LOW (ref 135–145)
Sodium: 132 mmol/L — ABNORMAL LOW (ref 135–145)
Sodium: 133 mmol/L — ABNORMAL LOW (ref 135–145)

## 2019-12-24 LAB — TSH: TSH: 2.729 u[IU]/mL (ref 0.350–4.500)

## 2019-12-24 LAB — HEMOGLOBIN A1C
Hgb A1c MFr Bld: 6.9 % — ABNORMAL HIGH (ref 4.8–5.6)
Mean Plasma Glucose: 151.33 mg/dL

## 2019-12-24 LAB — GLUCOSE, CAPILLARY
Glucose-Capillary: 95 mg/dL (ref 70–99)
Glucose-Capillary: 119 mg/dL — ABNORMAL HIGH (ref 70–99)
Glucose-Capillary: 124 mg/dL — ABNORMAL HIGH (ref 70–99)
Glucose-Capillary: 111 mg/dL — ABNORMAL HIGH (ref 70–99)

## 2019-12-24 LAB — URINE CULTURE

## 2019-12-24 LAB — MAGNESIUM: Magnesium: 2.9 mg/dL — ABNORMAL HIGH (ref 1.7–2.4)

## 2019-12-24 LAB — PROCALCITONIN: Procalcitonin: <0.1 ng/mL

## 2019-12-24 LAB — OSMOLALITY: Osmolality: 276 mOsm/kg (ref 275–295)

## 2019-12-24 MED ORDER — GUAIFENESIN ER 600 MG PO TB12
600.0000 mg | ORAL_TABLET | Freq: Two times a day (BID) | ORAL | Status: DC
  Administered 2019-12-24 – 2019-12-26 (×5): 600 mg via ORAL
  Filled 2019-12-24 (×5): qty 1

## 2019-12-24 MED ORDER — ENOXAPARIN SODIUM 40 MG/0.4ML ~~LOC~~ SOLN
40.0000 mg | SUBCUTANEOUS | Status: DC
  Administered 2019-12-24 – 2019-12-26 (×3): 40 mg via SUBCUTANEOUS
  Filled 2019-12-24 (×4): qty 0.4

## 2019-12-24 MED ORDER — SODIUM CHLORIDE 0.9 % IV SOLN: INTRAVENOUS | Status: DC

## 2019-12-24 MED ORDER — AMLODIPINE BESYLATE 5 MG PO TABS
2.5000 mg | ORAL_TABLET | Freq: Every day | ORAL | Status: DC
  Administered 2019-12-24 – 2019-12-25 (×2): 2.5 mg via ORAL
  Filled 2019-12-24 (×2): qty 1

## 2019-12-24 MED ORDER — MAGNESIUM SULFATE 4 GM/100ML IV SOLN
4.0000 g | Freq: Once | INTRAVENOUS | Status: AC
  Administered 2019-12-24: 4 g via INTRAVENOUS
  Filled 2019-12-24: qty 100

## 2019-12-24 MED ORDER — LOSARTAN POTASSIUM 50 MG PO TABS
100.0000 mg | ORAL_TABLET | Freq: Every day | ORAL | Status: DC
  Administered 2019-12-24 – 2019-12-26 (×3): 100 mg via ORAL
  Filled 2019-12-24 (×3): qty 2

## 2019-12-24 MED ORDER — ALBUTEROL SULFATE (2.5 MG/3ML) 0.083% IN NEBU: 2.5000 mg | INHALATION_SOLUTION | Freq: Four times a day (QID) | RESPIRATORY_TRACT | Status: DC | PRN

## 2019-12-24 MED ORDER — MENTHOL 3 MG MT LOZG
1.0000 | LOZENGE | OROMUCOSAL | Status: DC | PRN
  Administered 2019-12-26: 3 mg via ORAL
  Filled 2019-12-24 (×2): qty 9

## 2019-12-24 MED ORDER — GUAIFENESIN-CODEINE 100-10 MG/5ML PO SOLN
5.0000 mL | Freq: Four times a day (QID) | ORAL | Status: DC | PRN
  Administered 2019-12-24 – 2019-12-25 (×3): 5 mL via ORAL
  Filled 2019-12-24 (×3): qty 5

## 2019-12-24 MED ORDER — INSULIN ASPART 100 UNIT/ML ~~LOC~~ SOLN: 0.0000 [IU] | Freq: Every day | SUBCUTANEOUS | Status: DC

## 2019-12-24 MED ORDER — HYDRALAZINE HCL 20 MG/ML IJ SOLN
5.0000 mg | INTRAMUSCULAR | Status: DC | PRN
  Administered 2019-12-24 – 2019-12-26 (×3): 5 mg via INTRAVENOUS
  Filled 2019-12-24 (×3): qty 1

## 2019-12-24 MED ORDER — LORAZEPAM 2 MG/ML IJ SOLN
1.0000 mg | Freq: Once | INTRAMUSCULAR | Status: AC
  Administered 2019-12-24: 1 mg via INTRAVENOUS
  Filled 2019-12-24: qty 1

## 2019-12-24 MED ORDER — METFORMIN HCL 500 MG PO TABS: 1000.0000 mg | ORAL_TABLET | Freq: Two times a day (BID) | ORAL | Status: DC

## 2019-12-24 MED ORDER — INSULIN ASPART 100 UNIT/ML ~~LOC~~ SOLN
0.0000 [IU] | Freq: Three times a day (TID) | SUBCUTANEOUS | Status: DC
  Administered 2019-12-24 – 2019-12-26 (×3): 1 [IU] via SUBCUTANEOUS
  Administered 2019-12-26: 5 [IU] via SUBCUTANEOUS

## 2019-12-24 NOTE — Progress Notes (Signed)
Pt arrived to 4E-07 via [facility]. VS stable. CHG bath completed and gown applied. Tele applied and CCMD notified. Pt oriented to room, bed, and call light. Pt denies any pain. Will continue to monitor.  Adella Hare, RN

## 2019-12-24 NOTE — H&P (Signed)
History and Physical    Katie Alvarado DJM:[id number] DOB: 1926-04-19 DOA: 12/23/2019  PCP: Hulan Fess, MD Patient coming from: [facility]  Chief Complaint: Generalized weakness  HPI: Katie Alvarado is a 86 y.o. female with medical history significant of COPD, bronchiectasis, non-insulin-dependent type II diabetes, GERD, hypertension presented to the ED with complaints of generalized weakness.  It is reported that the patient was seen by PCPs office earlier yesterday and diagnosed with a UTI. Patient reports having generalized weakness for several months and states her sodium has been low in the past. Reports having a chronic cough due to COPD. Denies headaches, fevers, shortness of breath, or chest pain. Denies nausea, vomiting, or abdominal pain. Reports having chronic alternating bouts of constipation and diarrhea. She is not having diarrhea at present.  ED Course: Slightly tachypneic.  Hypertensive with systolic in the 222L to 798X.  Remainder of vital signs stable.  Labs showing no leukocytosis.  Hemoglobin at baseline.  Platelet count normal.  Corrected sodium 126.  UA with small amount of leukocytes, 6-10 WBCs, and rare bacteria.  Urine culture pending.  SARS-CoV-2 PCR test negative.  Magnesium 1.1.  Chest x-ray showing mild left basilar atelectasis and/or infiltrate with mild right upper lobe linear scarring and/or atelectasis.  Interval decrease in size of small peripheral lung nodules since the prior study.  EKG showing diffuse T wave inversions.  Initial high sensitive troponin negative.  ED provider had discussed EKG findings with Dr. Haroldine Laws from cardiology who felt that the EKG changes were related to electrolyte abnormalities.  He recommended obtaining an echocardiogram and reconsulting cardiology if any abnormalities are noted.  Patient received Robitussin, Atrovent, Metformin, Protonix, azithromycin, ceftriaxone, Tylenol, Xanax, and a 500 cc normal saline bolus.  Started on normal  saline at 125 cc/h.  Repeat sodium 130.  TSH pending.  Review of Systems:  All systems reviewed and apart from history of presenting illness, are negative.  Past Medical History:  Diagnosis Date  . Acute nasopharyngitis (common cold)   . Arthritis   . Bronchiectasis with acute exacerbation ([place])   . Cancer ([place])    skin cancer  . COPD (chronic obstructive pulmonary disease) ([place])    sees dr. Annamaria Boots   . Cough   . Diabetes mellitus    fasting 130-150  . Dizziness   . Esophageal reflux   . History of radiation therapy 10/24/17- 12/05/17   Lower lip, 4.4 Gy X 10 fractions given twice weekly for a total dose of 44 Gy.  Marland Kitchen Hypertension   . Insomnia, unspecified   . Other symptoms involving nervous and musculoskeletal systems(781.99)   . Pneumonia    hx of  . Shortness of breath    exertion    Past Surgical History:  Procedure Laterality Date  . ABDOMINAL HYSTERECTOMY    . ANKLE FRACTURE SURGERY Left   . APPENDECTOMY    . BASAL CELL CARCINOMA EXCISION     NOSE  . CATARACT EXTRACTION, BILATERAL     OUT PATIENT  . COLON SURGERY     colon resection for diverticulitis  . IR EPIDUROGRAPHY  05/07/2018  . LUMBAR LAMINECTOMY/DECOMPRESSION MICRODISCECTOMY N/A 02/15/2013   Procedure: LUMBAR LAMINECTOMY/DECOMPRESSION MICRODISCECTOMY LUMBAR FOUR-FIVE;  Surgeon: Otilio Connors, MD;  Location: [facility];  Service: Neurosurgery;  Laterality: N/A;  . VARICOSE VEIN SURGERY       reports that she has never smoked. She has never used smokeless tobacco. She reports current alcohol use. She reports that she does not  use drugs.  Allergies  Allergen Reactions  . Welchol [Colesevelam Hcl] Hives  . Glucosamine Other (See Comments)    Unknown  . Tramadol Nausea Only  . Celecoxib Hives, Itching and Rash  . Statins Rash    Family History  Problem Relation Age of Onset  . Chronic bronchitis Father   . Other Sister        heart trouble  . Other Brother        heart trouble    Prior to  Admission medications   Medication Sig Start Date End Date Taking? Authorizing Provider  acetaminophen (TYLENOL) 325 MG tablet Take 2 tablets (650 mg total) by mouth every 6 (six) hours as needed for pain. Patient taking differently: Take 650 mg by mouth every 6 (six) hours as needed.  08/05/12   Eugenie Filler, MD  ALPRAZolam Duanne Moron) 0.25 MG tablet Take 0.25 mg by mouth 2 (two) times daily as needed for sleep or anxiety.     [provider]  Artificial Tear Ointment (DRY EYES OP) Apply 1 drop to eye 2 (two) times daily as needed (for dry eyes).     [provider]  budesonide (PULMICORT) 0.25 MG/2ML nebulizer solution USE 1 VIAL  IN  NEBULIZER TWICE  DAILY - rinse mouth after treatment 08/02/19   Baird Lyons D, MD  cholecalciferol (VITAMIN D) 1000 UNITS tablet Take 2,000 Units by mouth 2 (two) times a week.     [provider]  Cinnamon 500 MG capsule Take 1,000 mg by mouth 2 (two) times daily as needed (for sugar levels).     [provider]  cycloSPORINE (RESTASIS) 0.05 % ophthalmic emulsion Place 1 drop into both eyes at bedtime as needed (dry eyes).     [provider]  esomeprazole (NEXIUM) 40 MG capsule Take 40 mg by mouth 2 (two) times daily before a meal.     [provider]  feeding supplement, ENSURE ENLIVE, (ENSURE ENLIVE) LIQD Take 237 mLs by mouth 2 (two) times daily between meals. 01/10/19   Dhungel, Nishant, MD  formoterol (PERFOROMIST) 20 MCG/2ML nebulizer solution Take 2 mLs (20 mcg total) by nebulization 2 (two) times daily. Dx: J47.9 01/19/19   Parrett, Fonnie Mu, NP  guaiFENesin-dextromethorphan (ROBITUSSIN DM) 100-10 MG/5ML syrup Take 5 mLs by mouth every 4 (four) hours as needed for cough. 01/10/19   Dhungel, Nishant, MD  HYDROcodone-homatropine (HYCODAN) 5-1.5 MG/5ML syrup Take 5 mLs by mouth every 6 (six) hours as needed for cough. 12/13/19   Baird Lyons D, MD  ipratropium (ATROVENT) 0.02 % nebulizer solution USE 1 VIAL  IN NEBULIZER 4 TIMES DAILY 08/02/19   Baird Lyons D, MD  losartan (COZAAR) 100 MG tablet Take 100 mg by mouth daily. 09/11/19   [provider]  meclizine (ANTIVERT) 25 MG tablet Take 25 mg by mouth every 6 (six) hours as needed for dizziness.  03/14/15   [provider]  metFORMIN (GLUCOPHAGE) 500 MG tablet Take 1,000 mg by mouth 2 (two) times daily.     [provider]  ONE TOUCH ULTRA TEST test strip USE TO CHECK BLOOD SUGAR ONCE DAILY 90 01/04/16   [provider]  [facility] DELICA LANCETS 25D MISC USE TO CHECK BLOOD SUGAR ONCE DAILY AS DIRECTED 01/04/16   [provider]  PROAIR HFA 108 (90 Base) MCG/ACT inhaler INHALE 2 PUFFS INTO THE LUNGS EVERY 6 HOURS AS NEEDED FOR WHEEZING OR SHORTNESS OF BREATH 12/09/19   Deneise Lever, MD  Respiratory Therapy Supplies (FLUTTER) DEVI Use as directed 11/03/15   Baird Lyons D, MD  rosuvastatin (CRESTOR) 5 MG tablet Take 5 mg by mouth once a week. 01/09/19   [provider]  traMADol (ULTRAM) 50 MG tablet Take 0.5 tablets (25 mg total) by mouth every 6 (six) hours as needed for moderate pain. 05/08/18   Georgette Shell, MD  vitamin E 400 UNIT capsule Take 400 Units by mouth 2 (two) times a week.     [provider]    Physical Exam: Vitals:   12/23/19 2200 12/23/19 2230 12/23/19 2300 12/24/19 0021  BP: (!) 165/78 (!) 161/74 (!) 163/78 (!) 186/76  Pulse: 86 78 78 75  Resp: (!) 21 20 (!) 21 (!) 22  Temp:  98.1 F (36.7 C)  97.7 F (36.5 C)  TempSrc:  Oral  Oral  SpO2: 92% 97% 95% 96%  Weight:      Height:        Physical Exam Constitutional:      General: She is not in acute distress. HENT:     Head: Normocephalic and atraumatic.     Mouth/Throat:     Mouth: Mucous membranes are dry.     Pharynx: Oropharynx is clear.  Eyes:     Extraocular Movements: Extraocular movements intact.     Conjunctiva/sclera: Conjunctivae normal.  Cardiovascular:     Rate and Rhythm: Normal  rate and regular rhythm.     Pulses: Normal pulses.  Pulmonary:     Effort: Pulmonary effort is normal. No respiratory distress.     Breath sounds: No wheezing or rales.  Abdominal:     General: Bowel sounds are normal. There is no distension.     Palpations: Abdomen is soft.     Tenderness: There is no abdominal tenderness.  Musculoskeletal:        General: No swelling or tenderness.     Cervical back: Normal range of motion and neck supple.  Skin:    General: Skin is warm and dry.     Comments: Decreased skin turgor  Neurological:     General: No focal deficit present.     Mental Status: She is alert and oriented to person, place, and time.     Labs on Admission: I have personally reviewed following labs and imaging studies  CBC: Recent Labs  Lab 12/23/19 1803  WBC 9.4  NEUTROABS 6.1  HGB 11.3*  HCT 34.8*  MCV 85.5  PLT 193   Basic Metabolic Panel: Recent Labs  Lab 12/23/19 1803 12/23/19 2245  NA 124* 130*  K 3.8 3.7  CL 87* 92*  CO2 24 28  GLUCOSE 200* 127*  BUN 21 17  CREATININE 0.77 0.59  CALCIUM 9.3 8.6*  MG  --  1.1*   GFR: Estimated Creatinine Clearance: 43.6 mL/min (by C-G formula based on SCr of 0.59 mg/dL). Liver Function Tests: Recent Labs  Lab 12/23/19 1803  AST 16  ALT 11  ALKPHOS 53  BILITOT 0.3  PROT 6.2*  ALBUMIN 3.5   No results for input(s): LIPASE, AMYLASE in the last 168 hours. No results for input(s): AMMONIA in the last 168 hours. Coagulation Profile: No results for input(s): INR, PROTIME in the last 168 hours. Cardiac Enzymes: No results for input(s): CKTOTAL, CKMB, CKMBINDEX, TROPONINI in the last 168 hours. BNP (last 3 results) No results for input(s): PROBNP in the last 8760 hours. HbA1C: No results for input(s): HGBA1C in the last 72 hours. CBG: No results  for input(s): GLUCAP in the last 168 hours. Lipid Profile: No results for input(s): CHOL, HDL, LDLCALC, TRIG, CHOLHDL, LDLDIRECT in the last 72 hours. Thyroid  Function Tests: No results for input(s): TSH, T4TOTAL, FREET4, T3FREE, THYROIDAB in the last 72 hours. Anemia Panel: No results for input(s): VITAMINB12, FOLATE, FERRITIN, TIBC, IRON, RETICCTPCT in the last 72 hours. Urine analysis:    Component Value Date/Time   COLORURINE YELLOW 12/23/2019 1838   APPEARANCEUR CLEAR 12/23/2019 1838   LABSPEC 1.010 12/23/2019 1838   PHURINE 6.0 12/23/2019 1838   GLUCOSEU NEGATIVE 12/23/2019 1838   HGBUR NEGATIVE 12/23/2019 1838   BILIRUBINUR NEGATIVE 12/23/2019 1838   KETONESUR NEGATIVE 12/23/2019 1838   PROTEINUR 30 (A) 12/23/2019 1838   UROBILINOGEN 0.2 12/19/2018 1155   NITRITE NEGATIVE 12/23/2019 1838   LEUKOCYTESUR SMALL (A) 12/23/2019 1838    Radiological Exams on Admission: DG Chest Portable 1 View  Result Date: 12/23/2019 CLINICAL DATA:  Generalized weakness. EXAM: PORTABLE CHEST 1 VIEW COMPARISON:  February 11, 2019 FINDINGS: The lungs are hyperinflated. Mild diffuse chronic appearing increased lung markings are seen. Mild atelectasis and/or infiltrate is seen within the left lung base. Mild linear scarring and/or atelectasis is noted within the upper right lung. Several ill-defined subcentimeter nodular opacities are seen along the periphery of the mid to lower lung fields, bilaterally. These areas are present on the prior study and decreased in severity. There is no evidence of a pleural effusion or pneumothorax. The heart size and mediastinal contours are within normal limits. There is marked severity calcification of the thoracic aorta. The visualized skeletal structures are unremarkable. IMPRESSION: 1. Mild left basilar atelectasis and/or infiltrate with mild right upper lobe linear scarring and/or atelectasis. 2. Interval decrease in size of small peripheral lung nodules since the prior study. Electronically Signed   By: Virgina Norfolk M.D.   On: 12/23/2019 17:38    EKG: Independently reviewed.  Sinus rhythm, diffuse T wave inversions which  are new compared to prior tracing from May 2021.  Assessment/Plan Principal Problem:   Acute hyponatremia Active Problems:   UTI (urinary tract infection)   CAP (community acquired pneumonia)   EKG abnormality   Hypertensive urgency   Acute on chronic hyponatremia: Likely due to poor oral intake as she appears dehydrated on exam. Corrected sodium 126, no recent labs for comparison but labs from a year ago also showing hyponatremia with sodium as low as 120 at that time.  Sodium improved to 130 with normal saline bolus and infusion. -Continue normal saline infusion.  Monitor sodium level every 4 hours and adjust rate of fluids accordingly.  TSH level pending.  Check serum osmolarity, urine sodium, and urine osmolarity.  Possible CAP: Chest x-ray showing mild left basilar atelectasis and/or possible infiltrate. Currently not tachypneic or hypoxic. No fever, leukocytosis, or other signs of sepsis.  SARS-CoV-2 PCR test negative. -Continue ceftriaxone and azithromycin.  Check procalcitonin level.  Continuous pulse ox, supplemental oxygen as needed.  Possible UTI: UA with small amount of leukocytes, 6-10 WBCs, and rare bacteria.  No fever, leukocytosis, or other signs of sepsis. -Continue ceftriaxone.  Urine culture pending.  EKG abnormality: EKG showing diffuse T wave inversions.  Initial high sensitive troponin negative.  Patient is not endorsing any anginal symptoms. ED provider had discussed EKG findings with cardiology who felt that the EKG changes were related to electrolyte abnormalities.  Potassium level normal.  Magnesium low at 1.1. -Cardiac monitoring, replete magnesium, trend troponin, continue to monitor electrolytes.  Echocardiogram ordered.  Cardiology recommending reconsulting their service if the echo shows any abnormalities.  COPD: Stable. -Resume home inhalers after pharmacy med rec is done  Noninsulin-dependent type 2 diabetes -Check A1c.  Sliding scale insulin sensitive ACHS  and CBG checks.  Hypertensive urgency: Blood pressure elevated with systolic up to 676H. -IV hydralazine PRN SBP >170  DVT prophylaxis: Lovenox Code Status: Patient wishes to be DNR. Family Communication: No family available at this time. Disposition Plan: Status is: Observation  The patient remains OBS appropriate and will d/c before 2 midnights.  Dispo: The patient is from: Home              Anticipated d/c is to: Home              Anticipated d/c date is: 2 days              Patient currently is not medically stable to d/c.  The medical decision making on this patient was of high complexity and the patient is at high risk for clinical deterioration, therefore this is a level 3 visit.  Shela Leff MD [facility]  If 7PM-7AM, please contact night-coverage [email or web address]  12/24/2019, 4:37 AM

## 2019-12-24 NOTE — Progress Notes (Signed)
  Echocardiogram 2D Echocardiogram has been performed.  Katie Alvarado 12/24/2019, 1:50 PM

## 2019-12-24 NOTE — Progress Notes (Signed)
PROGRESS NOTE  Katie Alvarado  DOB: [birth year]  PCP: Hulan Fess, MD HUT:[id number]  DOA: 12/23/2019  LOS: 0 days   Chief Complaint  Patient presents with  . Weakness   Brief narrative: Katie Alvarado is a 86 y.o. female with PMH of COPD, bronchiectasis, non-insulin-dependent type II diabetes, GERD, hypertension. Patient presented to the ED on 9/2 with complaints of generalized weakness. Patient reports chronic hyponatremia and progressively worsening generalized weakness for several months. She was seen by PCP on 9/1, diagnosed with UTI and started on antibiotics.  In the ED, patient was afebrile, hemodynamically stable. Breathing room air.  WBC count normal at 9.4, hemoglobin 11.3, sodium level low at 124, glucose 200, renal function normal, magnesium low at 1.1. Urinalysis with clear yellow urine with small amount of leukocytes, rare bacteria. Chest x-ray showed mild left basilar atelectasis and/or infiltrate with mild right upper lobe linear scarring and/or atelectasis. EKG showed diffuse T wave inversions. Initial high sensitive troponin was negative.   Patient was kept under observation in hospitalist service.  Subjective: Patient was seen and examined this morning.  Pleasant elderly Caucasian female.  Propped up in bed.  Not in distress.  Feels better. Chart reviewed. Afebrile, heart rate in 70s, blood pressure up to 186/76 this morning. BMP this morning pending  Assessment/Plan: Acute on chronic hyponatremia:  -Reported poor oral intake, dehydrated on exam.  -Lastly, sodium level is low at 120 improved to 130 with IV fluid.  -This admission, patient presented with a low sodium level 124, improving on normal saline.  -TSH normal. Check urine sodium, and urine osmolarity. Recent Labs  Lab 12/23/19 1803 12/23/19 2245 12/24/19 0844  NA 124* 130* 133*   Community-acquired pneumonia -Not tachypneic or hypoxic. No fever, leukocytosis.   -Pending procalcitonin level.     -Empirically started on IV Rocephin and IV azithromycin on admission.    Possible UTI -UA with small amount of leukocytes, 6-10 WBCs, and rare bacteria.  No fever, leukocytosis, or other signs of sepsis. -Continue IV ceftriaxone. Urine culture pending.  EKG abnormality:  -No anginal symptoms. EKG showed diffuse T wave inversions. Initial high sensitive troponin negative. -ED physician discussed with cardiology.  Echocardiogram with EF 60 to 65% no LV wall motion abnormalities, grade 1 diastolic dysfunction.  COPD: Stable. -Resume home inhalers  Noninsulin-dependent type 2 diabetes -A1c 6.9 -Metformin on hold.  Sliding scale insulin sensitive ACHS and CBG checks. Recent Labs  Lab 12/24/19 0559 12/24/19 1214  GLUCAP 95 119*   Hypertensive urgency:  -Blood pressure elevated with systolic up to 656C. -Home meds include HCTZ 12.5 mg daily, Losartan 100 mg daily. -I will stop HCTZ because of hyponatremia. -Resume losartan.  May need to add amlodipine as well. -IV hydralazine PRN SBP >170  Mobility: Pending PT eval Code Status:   Code Status: DNR  Nutritional status: Body mass index is 20.65 kg/m.     Diet Order            Diet Carb Modified Fluid consistency: Thin; Room service appropriate? Yes with Assist  Diet effective now                 DVT prophylaxis: enoxaparin (LOVENOX) injection 40 mg Start: 12/24/19 0800   Antimicrobials:  IV Rocephin Fluid: Normal saline at 75 mill per hour Consultants: None Family Communication:  Discussed with family at bedside  Status is: Observation  The patient will require care spanning > 2 midnights and should be moved to inpatient because: Hemodynamically  unstable, Ongoing diagnostic testing needed not appropriate for outpatient work up and IV treatments appropriate due to intensity of illness or inability to take PO  Dispo: The patient is from: Home              Anticipated d/c is to: Home pending PT eval               Anticipated d/c date is: 2 days              Patient currently is not medically stable to d/c.       Infusions:  . sodium chloride 250 mL (12/23/19 1857)  . sodium chloride 75 mL/hr at 12/24/19 0928  . azithromycin    . cefTRIAXone (ROCEPHIN)  IV      Scheduled Meds: . amLODipine  2.5 mg Oral Daily  . enoxaparin (LOVENOX) injection  40 mg Subcutaneous Q24H  . guaiFENesin  600 mg Oral BID  . insulin aspart  0-5 Units Subcutaneous QHS  . insulin aspart  0-9 Units Subcutaneous TID WC  . losartan  100 mg Oral Daily    Antimicrobials: Anti-infectives (From admission, onward)   Start     Dose/Rate Route Frequency Ordered Stop   12/24/19 2000  cefTRIAXone (ROCEPHIN) 1 g in sodium chloride 0.9 % 100 mL IVPB        1 g 200 mL/hr over 30 Minutes Intravenous Every 24 hours 12/23/19 2058     12/24/19 2000  azithromycin (ZITHROMAX) 500 mg in sodium chloride 0.9 % 250 mL IVPB        500 mg 250 mL/hr over 60 Minutes Intravenous Every 24 hours 12/23/19 2058     12/23/19 1815  cefTRIAXone (ROCEPHIN) 1 g in sodium chloride 0.9 % 100 mL IVPB        1 g 200 mL/hr over 30 Minutes Intravenous  Once 12/23/19 1808 12/23/19 1931   12/23/19 1815  azithromycin (ZITHROMAX) 500 mg in sodium chloride 0.9 % 250 mL IVPB        500 mg 250 mL/hr over 60 Minutes Intravenous  Once 12/23/19 1808 12/23/19 2048      PRN meds: sodium chloride, acetaminophen, hydrALAZINE, menthol-cetylpyridinium   Objective: Vitals:   12/24/19 1200 12/24/19 1530  BP: (!) 146/63 (!) 179/85  Pulse: 87 87  Resp: 20 19  Temp: 98 F (36.7 C)   SpO2: 97% 95%    Intake/Output Summary (Last 24 hours) at 12/24/2019 1614 Last data filed at 12/24/2019 1228 Gross per 24 hour  Intake 1439.04 ml  Output 3150 ml  Net -1710.96 ml   Filed Weights   12/23/19 1656  Weight: 61.6 kg   Weight change:  Body mass index is 20.65 kg/m.   Physical Exam: General exam: Appears calm and comfortable.  Not in distress Skin: No rashes,  lesions or ulcers. HEENT: Atraumatic, normocephalic, supple neck, no obvious bleeding Lungs: Clear to auscultation bilaterally CVS: Regular rate and rhythm, no murmur GI/Abd soft, nontender, nondistended, bowel sound present CNS: Alert, awake, oriented x3 Psychiatry: Mood appropriate Extremities: No pedal edema, no calf tenderness  Data Review: I have personally reviewed the laboratory data and studies available.  Recent Labs  Lab 12/23/19 1803  WBC 9.4  NEUTROABS 6.1  HGB 11.3*  HCT 34.8*  MCV 85.5  PLT 394   Recent Labs  Lab 12/23/19 1803 12/23/19 2245 12/24/19 0844  NA 124* 130* 133*  K 3.8 3.7 4.0  CL 87* 92* 96*  CO2 24 28 26   GLUCOSE  200* 127* 128*  BUN 21 17 8   CREATININE 0.77 0.59 0.75  CALCIUM 9.3 8.6* 9.2  MG  --  1.1* 2.9*   Signed, Terrilee Croak, MD [facility] Pager: [phone] (Secure Chat preferred). 12/24/2019

## 2019-12-25 LAB — GLUCOSE, CAPILLARY
Glucose-Capillary: 115 mg/dL — ABNORMAL HIGH (ref 70–99)
Glucose-Capillary: 114 mg/dL — ABNORMAL HIGH (ref 70–99)
Glucose-Capillary: 122 mg/dL — ABNORMAL HIGH (ref 70–99)
Glucose-Capillary: 150 mg/dL — ABNORMAL HIGH (ref 70–99)

## 2019-12-25 LAB — BASIC METABOLIC PANEL
Anion gap: 11 (ref 5–15)
BUN: 9 mg/dL (ref 8–23)
CO2: 21 mmol/L — ABNORMAL LOW (ref 22–32)
Calcium: 8.6 mg/dL — ABNORMAL LOW (ref 8.9–10.3)
Chloride: 100 mmol/L (ref 98–111)
Creatinine, Ser: 0.69 mg/dL (ref 0.44–1.00)
GFR calc Af Amer: >60 mL/min (ref >60)
GFR calc non Af Amer: >60 mL/min (ref >60)
Glucose, Bld: 128 mg/dL — ABNORMAL HIGH (ref 70–99)
Potassium: 4.3 mmol/L (ref 3.5–5.1)
Sodium: 132 mmol/L — ABNORMAL LOW (ref 135–145)

## 2019-12-25 MED ORDER — IPRATROPIUM-ALBUTEROL 0.5-2.5 (3) MG/3ML IN SOLN
3.0000 mL | Freq: Four times a day (QID) | RESPIRATORY_TRACT | Status: DC | PRN
  Administered 2019-12-25: 3 mL via RESPIRATORY_TRACT
  Filled 2019-12-25: qty 3

## 2019-12-25 MED ORDER — AMLODIPINE BESYLATE 10 MG PO TABS
10.0000 mg | ORAL_TABLET | Freq: Every day | ORAL | Status: DC
  Administered 2019-12-26: 10 mg via ORAL
  Filled 2019-12-25: qty 1

## 2019-12-25 MED ORDER — ENSURE ENLIVE PO LIQD
237.0000 mL | Freq: Two times a day (BID) | ORAL | Status: DC
  Administered 2019-12-25 – 2019-12-26 (×2): 237 mL via ORAL

## 2019-12-25 MED ORDER — LIDOCAINE 4 % EX CREA
TOPICAL_CREAM | Freq: Three times a day (TID) | CUTANEOUS | Status: DC | PRN
  Administered 2019-12-25: 1 via TOPICAL
  Filled 2019-12-25: qty 5

## 2019-12-25 MED ORDER — ADULT MULTIVITAMIN W/MINERALS CH
1.0000 | ORAL_TABLET | Freq: Every day | ORAL | Status: DC
  Administered 2019-12-26: 1 via ORAL
  Filled 2019-12-25: qty 1

## 2019-12-25 MED ORDER — CEFDINIR 300 MG PO CAPS
300.0000 mg | ORAL_CAPSULE | Freq: Two times a day (BID) | ORAL | Status: DC
  Administered 2019-12-25 – 2019-12-26 (×3): 300 mg via ORAL
  Filled 2019-12-25 (×3): qty 1

## 2019-12-25 MED ORDER — AZITHROMYCIN 500 MG PO TABS
500.0000 mg | ORAL_TABLET | Freq: Once | ORAL | Status: AC
  Administered 2019-12-25: 500 mg via ORAL
  Filled 2019-12-25: qty 1

## 2019-12-25 NOTE — Progress Notes (Signed)
PROGRESS NOTE    Katie Alvarado  SWF:[id number] DOB: [birth year] DOA: 12/23/2019 PCP: Hulan Fess, MD   Brief Narrative:  Patient is a 86 year old female with history of COPD, bronchiectasis, non-insulin-dependent diabetes type 2, GERD, hypertension who presents to the emergency department with complaints of general weakness.  She has history of chronic hyponatremia.  She was progressively getting weak for last several months.  She was also seen by her PCP recently and was diagnosed with UTI and was on antibiotics.  On presentation, she was hemodynamically stable.  Lab work showed sodium of 124, magnesium at 1.1.  Chest x-ray showed mild left basilar atelectasis/infiltrate on the mid right upper lobe.  She was started on antibiotics.  Physical therapy consulted.  Assessment & Plan:   Principal Problem:   Acute hyponatremia Active Problems:   UTI (urinary tract infection)   CAP (community acquired pneumonia)   EKG abnormality   Hypertensive urgency   Acute on chronic hyponatremia: Could be secondary to thiazide diuretics that she was taking at home.  She was also having poor oral intake and appeared dehydrated on presentation.  Sodium has improved with IV fluids.  TSH is normal.  Will discontinue diuretic at discharge.  Suspected community-acquired pneumonia: Currently on room air.  Hemodynamically stable.  No fever or leukocytosis.  Chest x-ray showed possible mild left basilar atelectasis/infiltrate on the mid right upper lobe.  Started on ceftriaxone, azithromycin.  We will change antibiotics to oral.  Suspected urinary tract infection: UA was not impressive.  Urine culture showed multiple organisms.  EKG abnormality: EKG showed diffuse T wave inversions.  Troponins negative.  No chest pain.  Echocardiogram showed ejection fraction of 66-6 5%, grade 1 diastolic dysfunction.  COPD: Currently stable.  Resumed home inhalers.  Non-insulin-dependent diabetes type 2: Hemoglobin C of 6.9,  Metformin on hold.  Continue sliding scale insulin.  Hypertension: Systolic blood pressure in the range of 180s.  Currently on losartan 100 mg daily and amlodipine 10 mg daily.  We will continue to monitor blood pressure.  Generalized weakness/debility/deconditioning: We will request for physical therapy evaluation.  Patient uses walker at home and lives alone.             DVT prophylaxis: Lovenox Code Status: DNR Family Communication: Discussed with son on phone today. Status is: Inpatient  Remains inpatient appropriate because:Unsafe d/c plan   Dispo: The patient is from: Home              Anticipated d/c is to: Home              Anticipated d/c date is: 1 day              Patient currently is not medically stable to d/c.  Hypertensive, waiting for physical therapy evaluation.    Consultants: None  Procedures:None  Antimicrobials:  Anti-infectives (From admission, onward)   Start     Dose/Rate Route Frequency Ordered Stop   12/24/19 2000  cefTRIAXone (ROCEPHIN) 1 g in sodium chloride 0.9 % 100 mL IVPB        1 g 200 mL/hr over 30 Minutes Intravenous Every 24 hours 12/23/19 2058     12/24/19 2000  azithromycin (ZITHROMAX) 500 mg in sodium chloride 0.9 % 250 mL IVPB        500 mg 250 mL/hr over 60 Minutes Intravenous Every 24 hours 12/23/19 2058     12/23/19 1815  cefTRIAXone (ROCEPHIN) 1 g in sodium chloride 0.9 % 100 mL IVPB  1 g 200 mL/hr over 30 Minutes Intravenous  Once 12/23/19 1808 12/23/19 1931   12/23/19 1815  azithromycin (ZITHROMAX) 500 mg in sodium chloride 0.9 % 250 mL IVPB        500 mg 250 mL/hr over 60 Minutes Intravenous  Once 12/23/19 1808 12/23/19 2048      Subjective:  Patient seen and examined the bedside this morning.  Hemodynamically stable.  Comfortable during my evaluation.  But hypertensive with systolic blood pressure in the range of 180s.  Complains of some cough but no chest pain or shortness of breath.  Very eager to go  home. I also talked to the son on phone at bedside and we discussed about monitoring her 1 more day until physical therapy sees her and her blood pressure is well controlled before discharge.  Objective: Vitals:   12/25/19 0100 12/25/19 0410 12/25/19 1015 12/25/19 1047  BP: 128/68 (!) 150/67 (!) 186/82 (!) 184/89  Pulse: 82 79 89   Resp: 19 18 20    Temp:  98.4 F (36.9 C) 98.2 F (36.8 C)   TempSrc:  Oral Oral   SpO2: 93% 94% 96%   Weight:      Height:        Intake/Output Summary (Last 24 hours) at 12/25/2019 1145 Last data filed at 12/25/2019 1019 Gross per 24 hour  Intake 2025.46 ml  Output 3050 ml  Net -1024.54 ml   Filed Weights   12/23/19 1656  Weight: 61.6 kg    Examination:  General exam: Very pleasant elderly female, deconditioned, debilitated HEENT:PERRL,Oral mucosa moist, Ear/Nose normal on gross exam Respiratory system: Bilateral equal air entry, normal vesicular breath sounds, no wheezes or crackles  Cardiovascular system: S1 & S2 heard, RRR. No JVD, murmurs, rubs, gallops or clicks. No pedal edema. Gastrointestinal system: Abdomen is nondistended, soft and nontender. No organomegaly or masses felt. Normal bowel sounds heard. Central nervous system: Alert and oriented. No focal neurological deficits. Extremities: No edema, no clubbing ,no cyanosis Skin: No rashes, lesions or ulcers,no icterus ,no pallor   Data Reviewed: I have personally reviewed following labs and imaging studies  CBC: Recent Labs  Lab 12/23/19 1803  WBC 9.4  NEUTROABS 6.1  HGB 11.3*  HCT 34.8*  MCV 85.5  PLT 423   Basic Metabolic Panel: Recent Labs  Lab 12/23/19 2245 12/24/19 0844 12/24/19 1825 12/24/19 2028 12/25/19 0608  NA 130* 133* 132* 133* 132*  K 3.7 4.0 3.8 4.0 4.3  CL 92* 96* 98 100 100  CO2 28 26 24 22  21*  GLUCOSE 127* 128* 255* 162* 128*  BUN 17 8 11 12 9   CREATININE 0.59 0.75 1.03* 0.92 0.69  CALCIUM 8.6* 9.2 8.7* 8.6* 8.6*  MG 1.1* 2.9*  --   --   --     GFR: Estimated Creatinine Clearance: 43.6 mL/min (by C-G formula based on SCr of 0.69 mg/dL). Liver Function Tests: Recent Labs  Lab 12/23/19 1803  AST 16  ALT 11  ALKPHOS 53  BILITOT 0.3  PROT 6.2*  ALBUMIN 3.5   No results for input(s): LIPASE, AMYLASE in the last 168 hours. No results for input(s): AMMONIA in the last 168 hours. Coagulation Profile: No results for input(s): INR, PROTIME in the last 168 hours. Cardiac Enzymes: No results for input(s): CKTOTAL, CKMB, CKMBINDEX, TROPONINI in the last 168 hours. BNP (last 3 results) No results for input(s): PROBNP in the last 8760 hours. HbA1C: Recent Labs    12/24/19 0844  HGBA1C 6.9*  CBG: Recent Labs  Lab 12/24/19 1214 12/24/19 1655 12/24/19 2127 12/25/19 0627 12/25/19 1119  GLUCAP 119* 124* 111* 115* 114*   Lipid Profile: No results for input(s): CHOL, HDL, LDLCALC, TRIG, CHOLHDL, LDLDIRECT in the last 72 hours. Thyroid Function Tests: Recent Labs    12/23/19 2300  TSH 2.729   Anemia Panel: No results for input(s): VITAMINB12, FOLATE, FERRITIN, TIBC, IRON, RETICCTPCT in the last 72 hours. Sepsis Labs: Recent Labs  Lab 12/24/19 0844  PROCALCITON <0.10    Recent Results (from the past 240 hour(s))  Urine culture     Status: Abnormal   Collection Time: 12/23/19  6:38 PM   Specimen: Urine, Random  Result Value Ref Range Status   Specimen Description   Final    URINE, RANDOM Performed at [facility], [address] [place], [place] [address]    Special Requests   Final    NONE Performed at [facility], [address] [place], [place] [address]    Culture MULTIPLE SPECIES PRESENT, SUGGEST RECOLLECTION (A)  Final   Report Status 12/24/2019 FINAL  Final  SARS Coronavirus 2 by RT PCR (hospital order, performed in [facility] hospital lab) Nasopharyngeal Nasopharyngeal Swab     Status: None   Collection Time: 12/23/19  6:52 PM   Specimen: Nasopharyngeal Swab  Result Value  Ref Range Status   SARS Coronavirus 2 NEGATIVE NEGATIVE Final    Comment: (NOTE) SARS-CoV-2 target nucleic acids are NOT DETECTED.  The SARS-CoV-2 RNA is generally detectable in upper and lower respiratory specimens during the acute phase of infection. The lowest concentration of SARS-CoV-2 viral copies this assay can detect is 250 copies / mL. A negative result does not preclude SARS-CoV-2 infection and should not be used as the sole basis for treatment or other patient management decisions.  A negative result may occur with improper specimen collection / handling, submission of specimen other than nasopharyngeal swab, presence of viral mutation(s) within the areas targeted by this assay, and inadequate number of viral copies (<250 copies / mL). A negative result must be combined with clinical observations, patient history, and epidemiological information.  Fact Sheet for Patients:   StrictlyIdeas.no  Fact Sheet for Healthcare Providers: BankingDealers.co.za  This test is not yet approved or  cleared by the [place] FDA and has been authorized for detection and/or diagnosis of SARS-CoV-2 by FDA under an Emergency Use Authorization (EUA).  This EUA will remain in effect (meaning this test can be used) for the duration of the COVID-19 declaration under Section 564(b)(1) of the Act, 21 U.S.C. section 360bbb-3(b)(1), unless the authorization is terminated or revoked sooner.  Performed at [facility], [address] [place],  [address]          Radiology Studies: DG Chest Portable 1 View  Result Date: 12/23/2019 CLINICAL DATA:  Generalized weakness. EXAM: PORTABLE CHEST 1 VIEW COMPARISON:  February 11, 2019 FINDINGS: The lungs are hyperinflated. Mild diffuse chronic appearing increased lung markings are seen. Mild atelectasis and/or infiltrate is seen within the left lung base. Mild linear scarring and/or  atelectasis is noted within the upper right lung. Several ill-defined subcentimeter nodular opacities are seen along the periphery of the mid to lower lung fields, bilaterally. These areas are present on the prior study and decreased in severity. There is no evidence of a pleural effusion or pneumothorax. The heart size and mediastinal contours are within normal limits. There is marked severity calcification  of the thoracic aorta. The visualized skeletal structures are unremarkable. IMPRESSION: 1. Mild left basilar atelectasis and/or infiltrate with mild right upper lobe linear scarring and/or atelectasis. 2. Interval decrease in size of small peripheral lung nodules since the prior study. Electronically Signed   By: Virgina Norfolk M.D.   On: 12/23/2019 17:38   ECHOCARDIOGRAM COMPLETE  Result Date: 12/24/2019    ECHOCARDIOGRAM REPORT   Patient Name:   ARALYN NOWAK Date of Exam: 12/24/2019 Medical Rec #:  [id number]      Height:       68.0 in Accession #:    [id number]     Weight:       135.8 lb Date of Birth:  [birth year]      BSA:          1.734 m Patient Age:    11 years       BP:           146/63 mmHg Patient Gender: F              HR:           87 bpm. Exam Location:  Inpatient Procedure: 2D Echo, Cardiac Doppler and Color Doppler Indications:    Abnormal ECG  History:        Patient has no prior history of Echocardiogram examinations.                 COPD; Risk Factors:Hypertension and Diabetes. GERD.  Sonographer:    Clayton Lefort RDCS (AE) Referring Phys: [id number] [place]  1. Left ventricular ejection fraction, by estimation, is 60 to 65%. The left ventricle has normal function. The left ventricle has no regional wall motion abnormalities. There is mild left ventricular hypertrophy of the basal-septal segment. Left ventricular diastolic parameters are consistent with Grade I diastolic dysfunction (impaired relaxation).  2. Right ventricular systolic function is normal. The right  ventricular size is normal.  3. The mitral valve is normal in structure. No evidence of mitral valve regurgitation. No evidence of mitral stenosis.  4. The aortic valve is normal in structure. Aortic valve regurgitation is not visualized. No aortic stenosis is present.  5. The inferior vena cava is normal in size with greater than 50% respiratory variability, suggesting right atrial pressure of 3 mmHg. FINDINGS  Left Ventricle: Left ventricular ejection fraction, by estimation, is 60 to 65%. The left ventricle has normal function. The left ventricle has no regional wall motion abnormalities. The left ventricular internal cavity size was normal in size. There is  mild left ventricular hypertrophy of the basal-septal segment. Left ventricular diastolic parameters are consistent with Grade I diastolic dysfunction (impaired relaxation). Normal left ventricular filling pressure. Right Ventricle: The right ventricular size is normal. No increase in right ventricular wall thickness. Right ventricular systolic function is normal. Left Atrium: Left atrial size was normal in size. Right Atrium: Right atrial size was normal in size. Pericardium: There is no evidence of pericardial effusion. Mitral Valve: The mitral valve is normal in structure. Normal mobility of the mitral valve leaflets. No evidence of mitral valve regurgitation. No evidence of mitral valve stenosis. MV peak gradient, 4.9 mmHg. The mean mitral valve gradient is 2.0 mmHg. Tricuspid Valve: The tricuspid valve is normal in structure. Tricuspid valve regurgitation is not demonstrated. No evidence of tricuspid stenosis. Aortic Valve: The aortic valve is normal in structure. Aortic valve regurgitation is not visualized. No aortic stenosis is present. Aortic valve mean gradient measures 3.0 mmHg. Aortic  valve peak gradient measures 5.3 mmHg. Aortic valve area, by VTI measures 2.40 cm. Pulmonic Valve: The pulmonic valve was normal in structure. Pulmonic valve  regurgitation is not visualized. No evidence of pulmonic stenosis. Aorta: The aortic root is normal in size and structure. Venous: The inferior vena cava is normal in size with greater than 50% respiratory variability, suggesting right atrial pressure of 3 mmHg. IAS/Shunts: No atrial level shunt detected by color flow Doppler.  LEFT VENTRICLE PLAX 2D LVIDd:         3.30 cm  Diastology LVIDs:         1.65 cm  LV e' lateral:   5.66 cm/s LV PW:         1.10 cm  LV E/e' lateral: 11.4 LV IVS:        1.60 cm  LV e' medial:    8.16 cm/s LVOT diam:     2.00 cm  LV E/e' medial:  7.9 LV SV:         48 LV SV Index:   28 LVOT Area:     3.14 cm  RIGHT VENTRICLE             IVC RV Basal diam:  2.90 cm     IVC diam: 1.00 cm RV S prime:     10.30 cm/s TAPSE (M-mode): 1.8 cm LEFT ATRIUM             Index       RIGHT ATRIUM           Index LA diam:        2.00 cm 1.15 cm/m  RA Area:     10.30 cm LA Vol (A2C):   45.4 ml 26.19 ml/m RA Volume:   24.40 ml  14.07 ml/m LA Vol (A4C):   31.9 ml 18.40 ml/m LA Biplane Vol: 41.2 ml 23.76 ml/m  AORTIC VALVE AV Area (Vmax):    2.48 cm AV Area (Vmean):   2.43 cm AV Area (VTI):     2.40 cm AV Vmax:           115.00 cm/s AV Vmean:          81.200 cm/s AV VTI:            0.199 m AV Peak Grad:      5.3 mmHg AV Mean Grad:      3.0 mmHg LVOT Vmax:         90.70 cm/s LVOT Vmean:        62.800 cm/s LVOT VTI:          0.152 m LVOT/AV VTI ratio: 0.76  AORTA Ao Root diam: 3.50 cm Ao Asc diam:  3.50 cm MITRAL VALVE MV Area (PHT): 2.74 cm     SHUNTS MV Peak grad:  4.9 mmHg     Systemic VTI:  0.15 m MV Mean grad:  2.0 mmHg     Systemic Diam: 2.00 cm MV Vmax:       1.11 m/s MV Vmean:      68.4 cm/s MV Decel Time: 277 msec MV E velocity: 64.30 cm/s MV A velocity: 101.00 cm/s MV E/A ratio:  0.64 Mihai Croitoru MD Electronically signed by Sanda Klein MD Signature Date/Time: 12/24/2019/2:11:21 PM    Final         Scheduled Meds: . Derrill Memo ON 12/26/2019] amLODipine  10 mg Oral Daily  . enoxaparin  (LOVENOX) injection  40 mg Subcutaneous Q24H  . guaiFENesin  600 mg Oral  BID  . insulin aspart  0-5 Units Subcutaneous QHS  . insulin aspart  0-9 Units Subcutaneous TID WC  . losartan  100 mg Oral Daily   Continuous Infusions: . sodium chloride 250 mL (12/23/19 1857)  . azithromycin 500 mg (12/24/19 2224)  . cefTRIAXone (ROCEPHIN)  IV 1 g (12/24/19 2017)     LOS: 1 day    Time spent: 25 mins.More than 50% of that time was spent in counseling and/or coordination of care.      Shelly Coss, MD [facility] P9/07/2019, 11:45 AM

## 2019-12-25 NOTE — Progress Notes (Signed)
Initial Nutrition Assessment  DOCUMENTATION CODES:   Not applicable  INTERVENTION:   Ensure Enlive po BID, each supplement provides 350 kcal and 20 grams of protein  MVI daily   NUTRITION DIAGNOSIS:   Increased nutrient needs related to chronic illness (COPD) as evidenced by increased estimated needs.  GOAL:   Patient will meet greater than or equal to 90% of their needs  MONITOR:   PO intake, Supplement acceptance, Labs, Weight trends, Skin, I & O's  REASON FOR ASSESSMENT:   Malnutrition Screening Tool    ASSESSMENT:   86 year old female with history of COPD, bronchiectasis, non-insulin-dependent diabetes type 2, GERD, hypertension who presents to the emergency department with complaints of general weakness and was found to have CAP and UTI  RD working remotely.  Unable to reach pt by phone. Per chart review, pt eating 75% of meals in hospital. RD will add supplements and MVI to help pt meet her estimated needs. Per chart, pt is weight stable at baseline. RD will obtain nutrition related history and exam at follow-up.   Medications reviewed and include: azithromycin, omnicef, lovenox, insulin  Labs reviewed: Na 132(L)  NUTRITION - FOCUSED PHYSICAL EXAM: Unable to perform at this time   Diet Order:   Diet Order            Diet Carb Modified Fluid consistency: Thin; Room service appropriate? Yes with Assist  Diet effective now                EDUCATION NEEDS:   No education needs have been identified at this time  Skin:  Skin Assessment: Reviewed RN Assessment  Last BM:  9/1  Height:   Ht Readings from Last 1 Encounters:  12/23/19 5\' 8"  (1.727 m)    Weight:   Wt Readings from Last 1 Encounters:  12/23/19 61.6 kg    Ideal Body Weight:  63.6 kg  BMI:  Body mass index is 20.65 kg/m.  Estimated Nutritional Needs:   Kcal:  1400-1600kcal/day  Protein:  70-80g/day  Fluid:  >1.4L/day  Koleen Distance MS, RD, LDN Please refer to [facility] for RD  and/or RD on-call/weekend/after hours pager

## 2019-12-25 NOTE — Evaluation (Signed)
Physical Therapy Evaluation Patient Details Name: Katie Alvarado MRN: [id number] DOB: Apr 15, 1926 Today's Date: 12/25/2019   History of Present Illness  86 y.o. female with medical history significant of COPD, bronchiectasis, non-insulin-dependent type II diabetes, GERD, hypertension presented to the ED with complaints of generalized weakness.  It is reported that the patient was seen by PCPs office earlier yesterday and diagnosed with a UTI. Patient reports having generalized weakness for several months and states her sodium has been low in the past. Reports having a chronic cough due to COPD. +hyponatremia, ?CAP, ?UTI,   Clinical Impression   Pt admitted with above diagnosis. Patient reports she normally has neighbors/friends who help her each morning and each evening and is generally only alone after she goes to bed. Currently her daughter and son are coming from out-of-town to stay with her. She denies falls. She was able to transfer with min-guard assist and ambulate 200 ft with RW.  Pt currently with functional limitations due to the deficits listed below (see PT Problem List). Pt will benefit from skilled PT to increase their independence and safety with mobility to allow discharge to the venue listed below.       Follow Up Recommendations Home health PT;Supervision/Assistance - 24 hour    Equipment Recommendations  None recommended by PT    Recommendations for Other Services       Precautions / Restrictions Precautions Precautions: Fall      Mobility  Bed Mobility                  Transfers Overall transfer level: Needs assistance Equipment used: Rolling walker (2 wheeled) Transfers: Sit to/from Stand Sit to Stand: Min guard         General transfer comment: pt with initial difficulty not pulling up on RW (vc to push off chair on all 3 transfpers  Ambulation/Gait Ambulation/Gait assistance: Min guard Gait Distance (Feet): 200 Feet Assistive device: Rolling  walker (2 wheeled) Gait Pattern/deviations: Step-through pattern;Decreased stride length     General Gait Details: good use of RW despite her usually using a Set designer    Modified Rankin (Stroke Patients Only)       Balance Overall balance assessment: Mild deficits observed, not formally tested                                           Pertinent Vitals/Pain Pain Assessment: Faces Faces Pain Scale: Hurts a little bit Pain Location: left shoulder Pain Descriptors / Indicators: Guarding Pain Intervention(s): Limited activity within patient's tolerance;Monitored during session    Home Living Family/patient expects to be discharged to:: Private residence Living Arrangements: Alone Available Help at Discharge: Friend(s);Available PRN/intermittently;Neighbor Type of Home: House Home Access: [place]: One [place]: [place] - 4 wheels;Bedside commode;Shower seat - built in;Cane - single point;Grab bars - tub/shower      Prior Function Level of Independence: Needs assistance   Gait / Transfers Assistance Needed: walks with rollator  ADL's / Homemaking Assistance Needed: does not drive; others do her grocery shopping or drive her to appts; does her own basic ADLs        Hand Dominance   Dominant Hand: Right    Extremity/Trunk Assessment   Upper Extremity Assessment Upper Extremity Assessment: LUE  deficits/detail LUE Deficits / Details: chronic shoulder pain; very limited    Lower Extremity Assessment Lower Extremity Assessment: Generalized weakness    Cervical / Trunk Assessment Cervical / Trunk Assessment: Normal  Communication   Communication: No difficulties  Cognition Arousal/Alertness: Awake/alert Behavior During Therapy: WFL for tasks assessed/performed Overall Cognitive Status: Within Functional Limits for tasks assessed                                         General Comments General comments (skin integrity, edema, etc.): She feels strong enough to go home    Exercises     Assessment/Plan    PT Assessment Patient needs continued PT services  PT Problem List Decreased strength;Decreased range of motion;Decreased activity tolerance;Decreased balance;Decreased mobility;Decreased knowledge of use of DME;Cardiopulmonary status limiting activity       PT Treatment Interventions DME instruction;Gait training;Functional mobility training;Therapeutic activities;Therapeutic exercise;Patient/family education    PT Goals (Current goals can be found in the Care Plan section)  Acute Rehab PT Goals Patient Stated Goal: go home with family assist PT Goal Formulation: With patient Time For Goal Achievement: 01/08/20 Potential to Achieve Goals: Good    Frequency Min 3X/week   Barriers to discharge Decreased caregiver support she does not always have 24/7 care but daughter is coming from out of town    Co-evaluation               AM-PAC PT "6 Clicks" Mobility  Outcome Measure Help needed turning from your back to your side while in a flat bed without using bedrails?: None Help needed moving from lying on your back to sitting on the side of a flat bed without using bedrails?: A Little Help needed moving to and from a bed to a chair (including a wheelchair)?: A Little Help needed standing up from a chair using your arms (e.g., wheelchair or bedside chair)?: A Little Help needed to walk in hospital room?: A Little Help needed climbing 3-5 steps with a railing? : A Lot 6 Click Score: 18    End of Session Equipment Utilized During Treatment: Gait belt Activity Tolerance: Patient tolerated treatment well Patient left: in chair;with call bell/phone within reach;with chair alarm set Nurse Communication: Mobility status PT Visit Diagnosis: Difficulty in walking, not elsewhere classified (R26.2)    Time: 2244-9753 PT Time  Calculation (min) (ACUTE ONLY): 37 min   Charges:   PT Evaluation $PT Eval Low Complexity: 1 Low PT Treatments $Gait Training: 8-22 mins         Arby Barrette, PT Pager [phone]   Rexanne Mano 12/25/2019, 3:45 PM

## 2019-12-26 LAB — GLUCOSE, CAPILLARY
Glucose-Capillary: 125 mg/dL — ABNORMAL HIGH (ref 70–99)
Glucose-Capillary: 259 mg/dL — ABNORMAL HIGH (ref 70–99)

## 2019-12-26 MED ORDER — AMLODIPINE BESYLATE 10 MG PO TABS
10.0000 mg | ORAL_TABLET | Freq: Every day | ORAL | 1 refills | Status: DC
Start: 2019-12-27 — End: 2020-02-09

## 2019-12-26 MED ORDER — CEFDINIR 300 MG PO CAPS: 300.0000 mg | ORAL_CAPSULE | Freq: Two times a day (BID) | ORAL | 0 refills | Status: AC

## 2019-12-26 MED ORDER — LIDOCAINE 4 % EX PTCH: 1.0000 | MEDICATED_PATCH | Freq: Every day | CUTANEOUS | 1 refills | Status: DC

## 2019-12-26 NOTE — TOC Transition Note (Signed)
Transition of Care [facility]) - CM/SW Discharge Note   Patient Details  Name: Katie Alvarado MRN: [id number] Date of Birth: 11-Oct-1925  Transition [facility] Care [facility]) CM/SW Contact:  Claudie Leach, RN 12/26/2019, 1:54 PM   Clinical Narrative:    Patient to go home with family. Patient has no preference of agency.    Referral accepted by [facility] with Alvis Lemmings.     Final next level of care: [place] Barriers to Discharge: No Barriers Identified   Patient Goals and CMS Choice Patient states their goals for this hospitalization and ongoing recovery are:: to get home CMS Medicare.gov Compare Post Acute Care list provided to:: Patient Choice offered to / list presented to : Patient  Discharge Plan and Services      HH Arranged: PT [facility] Agency: [facility] Date [place]: 12/26/19 Time [place]: [id number] Representative spoke with at [place]: Tommi Rumps

## 2019-12-26 NOTE — Discharge Summary (Signed)
Physician Discharge Summary  Katie Alvarado FTD:[id number] DOB: [birth year] DOA: 12/23/2019  PCP: Hulan Fess, MD  Admit date: 12/23/2019 Discharge date: 12/26/2019  Admitted From: Home Disposition:  Home  Discharge Condition:Stable CODE STATUS:DNR Diet recommendation: Heart Healthy   Brief/Interim Summary:  Patient is a 86 year old female with history of COPD, bronchiectasis, non-insulin-dependent diabetes type 2, GERD, hypertension who presents to the emergency department with complaints of general weakness.  She has history of chronic hyponatremia.  She was progressively getting weak for last several months.  She was also seen by her PCP recently and was diagnosed with UTI and was on antibiotics.  On presentation, she was hemodynamically stable.  Lab work showed sodium of 124, magnesium at 1.1.  Chest x-ray showed mild left basilar atelectasis/infiltrate on the mid right upper lobe.  Started on antibiotics.  Currently she is hemodynamically stable.  Respiratory status is stable and she is on room air.   Physical therapy recommended home health on discharge.  She is medically stable for discharge home today.  Following problems were addressed during her hospitalization:  Acute on chronic hyponatremia:   She washaving poor oral intake and appeared dehydrated on presentation.  Sodium has improved with IV fluids.  TSH is normal.  Check BMP in a week  Suspected community-acquired pneumonia: Currently on room air.  Hemodynamically stable.  No fever or leukocytosis.  Chest x-ray showed possible mild left basilar atelectasis/infiltrate on the mid right upper lobe.  Started on ceftriaxone, azithromycin. Changed antibiotics to oral.  Suspected urinary tract infection: UA was not impressive.  Urine culture showed multiple organisms.  EKG abnormality: EKG showed diffuse T wave inversions.  Troponins negative.  No chest pain.  Echocardiogram showed ejection fraction of 66-6 5%, grade 1 diastolic  dysfunction.  COPD: Currently stable.  Resumed home inhalers.  Non-insulin-dependent diabetes type 2: Hemoglobin A1C of 6.9, takes Metformin at home  Hypertension:   Continue losartan 100 mg daily and amlodipine 10 mg daily.   Generalized weakness/debility/deconditioning:   Patient uses walker at home and lives alone.  PT recommended home health.   Discharge Diagnoses:  Principal Problem:   Acute hyponatremia Active Problems:   UTI (urinary tract infection)   CAP (community acquired pneumonia)   EKG abnormality   Hypertensive urgency    Discharge Instructions  Discharge Instructions    Diet - low sodium heart healthy   Complete by: As directed    Discharge instructions   Complete by: As directed    1)Take prescribed medications as instructed. 2)Follow up with your PCP in a week.  Do a BMP test during the follow-up to check your sodium level. 3)Monitor your blood pressure at home   Increase activity slowly   Complete by: As directed      Allergies as of 12/26/2019      Reactions   Welchol [colesevelam Hcl] Hives   Glucosamine Other (See Comments)   Unknown   Tramadol Nausea Only   Celecoxib Hives, Itching, Rash   Statins Rash      Medication List    STOP taking these medications   budesonide 0.25 MG/2ML nebulizer solution Commonly known as: PULMICORT   ipratropium 0.02 % nebulizer solution Commonly known as: ATROVENT   predniSONE 5 MG tablet Commonly known as: DELTASONE     TAKE these medications   acetaminophen 325 MG tablet Commonly known as: TYLENOL Take 2 tablets (650 mg total) by mouth every 6 (six) hours as needed for pain.   ALPRAZolam 0.25 MG tablet  Commonly known as: XANAX Take 0.25 mg by mouth See admin instructions. 0.25mg  at bedtime And an extra 0.25mg  if upset during the day   amLODipine 10 MG tablet Commonly known as: NORVASC Take 1 tablet (10 mg total) by mouth daily. Start taking on: December 27, 2019   aspirin EC 81 MG  tablet Take 81 mg by mouth daily. Swallow whole.   cefdinir 300 MG capsule Commonly known as: OMNICEF Take 1 capsule (300 mg total) by mouth every 12 (twelve) hours for 2 days.   cholecalciferol 1000 units tablet Commonly known as: VITAMIN D Take 2,000 Units by mouth daily.   Cinnamon 500 MG capsule Take 1,000 mg by mouth 2 (two) times daily as needed (for sugar levels).   DRY EYES OP Apply 1 drop to eye 2 (two) times daily as needed (for dry eyes).   esomeprazole 40 MG capsule Commonly known as: NEXIUM Take 40 mg by mouth daily.   feeding supplement (ENSURE ENLIVE) Liqd Take 237 mLs by mouth 2 (two) times daily between meals.   Flutter Devi Use as directed   guaiFENesin-dextromethorphan 100-10 MG/5ML syrup Commonly known as: ROBITUSSIN DM Take 5 mLs by mouth every 4 (four) hours as needed for cough.   HYDROcodone-homatropine 5-1.5 MG/5ML syrup Commonly known as: HYCODAN Take 5 mLs by mouth every 6 (six) hours as needed for cough.   Lidocaine 4 % Ptch Commonly known as: HM Lidocaine Patch Apply 1 each topically daily.   losartan 100 MG tablet Commonly known as: COZAAR Take 100 mg by mouth daily.   metFORMIN 500 MG tablet Commonly known as: GLUCOPHAGE Take 1,000 mg by mouth 2 (two) times daily.   Muscle Rub 10-15 % Crea Apply 1 application topically as needed for muscle pain.   ONE TOUCH ULTRA TEST test strip Generic drug: glucose blood USE TO CHECK BLOOD SUGAR ONCE DAILY 90   OneTouch Delica Lancets 37J Misc USE TO CHECK BLOOD SUGAR ONCE DAILY AS DIRECTED   OVER THE COUNTER MEDICATION Apply 1 application topically as needed (arthritis). Horse linimint cream for pain   Perforomist 20 MCG/2ML nebulizer solution Generic drug: formoterol Take 2 mLs (20 mcg total) by nebulization 2 (two) times daily. Dx: J47.9   polyvinyl alcohol 1.4 % ophthalmic solution Commonly known as: LIQUIFILM TEARS Place 1 drop into both eyes as needed for dry eyes.    PRESERVISION AREDS 2 PO Take 1 tablet by mouth in the morning and at bedtime.   ProAir HFA 108 (90 Base) MCG/ACT inhaler Generic drug: albuterol INHALE 2 PUFFS INTO THE LUNGS EVERY 6 HOURS AS NEEDED FOR WHEEZING OR SHORTNESS OF BREATH What changed: See the new instructions.   SALONPAS EX Apply 1 patch topically as needed (pain).   traMADol 50 MG tablet Commonly known as: ULTRAM Take 0.5 tablets (25 mg total) by mouth every 6 (six) hours as needed for moderate pain.   vitamin E 180 MG (400 UNITS) capsule Take 400 Units by mouth daily.       Follow-up Information    Little, Lennette Bihari, MD. Schedule an appointment as soon as possible for a visit in 1 week.   Specialty: Family Medicine Why: For recheck Contact information: [place] [address] [phone]              Allergies  Allergen Reactions  . Welchol [Colesevelam Hcl] Hives  . Glucosamine Other (See Comments)    Unknown  . Tramadol Nausea Only  . Celecoxib Hives, Itching and Rash  .  Statins Rash    Consultations: None  Procedures/Studies: DG Chest Portable 1 View  Result Date: 12/23/2019 CLINICAL DATA:  Generalized weakness. EXAM: PORTABLE CHEST 1 VIEW COMPARISON:  February 11, 2019 FINDINGS: The lungs are hyperinflated. Mild diffuse chronic appearing increased lung markings are seen. Mild atelectasis and/or infiltrate is seen within the left lung base. Mild linear scarring and/or atelectasis is noted within the upper right lung. Several ill-defined subcentimeter nodular opacities are seen along the periphery of the mid to lower lung fields, bilaterally. These areas are present on the prior study and decreased in severity. There is no evidence of a pleural effusion or pneumothorax. The heart size and mediastinal contours are within normal limits. There is marked severity calcification of the thoracic aorta. The visualized skeletal structures are unremarkable. IMPRESSION: 1. Mild left basilar  atelectasis and/or infiltrate with mild right upper lobe linear scarring and/or atelectasis. 2. Interval decrease in size of small peripheral lung nodules since the prior study. Electronically Signed   By: Virgina Norfolk M.D.   On: 12/23/2019 17:38   ECHOCARDIOGRAM COMPLETE  Result Date: 12/24/2019    ECHOCARDIOGRAM REPORT   Patient Name:   Katie Alvarado Date of Exam: 12/24/2019 Medical Rec #:  [id number]      Height:       68.0 in Accession #:    [id number]     Weight:       135.8 lb Date of Birth:  1925/11/23      BSA:          1.734 m Patient Age:    10 years       BP:           146/63 mmHg Patient Gender: F              HR:           87 bpm. Exam Location:  Inpatient Procedure: 2D Echo, Cardiac Doppler and Color Doppler Indications:    Abnormal ECG  History:        Patient has no prior history of Echocardiogram examinations.                 COPD; Risk Factors:Hypertension and Diabetes. GERD.  Sonographer:    Clayton Lefort RDCS (AE) Referring Phys: [id number] Tibbie  1. Left ventricular ejection fraction, by estimation, is 60 to 65%. The left ventricle has normal function. The left ventricle has no regional wall motion abnormalities. There is mild left ventricular hypertrophy of the basal-septal segment. Left ventricular diastolic parameters are consistent with Grade I diastolic dysfunction (impaired relaxation).  2. Right ventricular systolic function is normal. The right ventricular size is normal.  3. The mitral valve is normal in structure. No evidence of mitral valve regurgitation. No evidence of mitral stenosis.  4. The aortic valve is normal in structure. Aortic valve regurgitation is not visualized. No aortic stenosis is present.  5. The inferior vena cava is normal in size with greater than 50% respiratory variability, suggesting right atrial pressure of 3 mmHg. FINDINGS  Left Ventricle: Left ventricular ejection fraction, by estimation, is 60 to 65%. The left ventricle has normal  function. The left ventricle has no regional wall motion abnormalities. The left ventricular internal cavity size was normal in size. There is  mild left ventricular hypertrophy of the basal-septal segment. Left ventricular diastolic parameters are consistent with Grade I diastolic dysfunction (impaired relaxation). Normal left ventricular filling pressure. Right Ventricle: The right ventricular size is normal. No increase  in right ventricular wall thickness. Right ventricular systolic function is normal. Left Atrium: Left atrial size was normal in size. Right Atrium: Right atrial size was normal in size. Pericardium: There is no evidence of pericardial effusion. Mitral Valve: The mitral valve is normal in structure. Normal mobility of the mitral valve leaflets. No evidence of mitral valve regurgitation. No evidence of mitral valve stenosis. MV peak gradient, 4.9 mmHg. The mean mitral valve gradient is 2.0 mmHg. Tricuspid Valve: The tricuspid valve is normal in structure. Tricuspid valve regurgitation is not demonstrated. No evidence of tricuspid stenosis. Aortic Valve: The aortic valve is normal in structure. Aortic valve regurgitation is not visualized. No aortic stenosis is present. Aortic valve mean gradient measures 3.0 mmHg. Aortic valve peak gradient measures 5.3 mmHg. Aortic valve area, by VTI measures 2.40 cm. Pulmonic Valve: The pulmonic valve was normal in structure. Pulmonic valve regurgitation is not visualized. No evidence of pulmonic stenosis. Aorta: The aortic root is normal in size and structure. Venous: The inferior vena cava is normal in size with greater than 50% respiratory variability, suggesting right atrial pressure of 3 mmHg. IAS/Shunts: No atrial level shunt detected by color flow Doppler.  LEFT VENTRICLE PLAX 2D LVIDd:         3.30 cm  Diastology LVIDs:         1.65 cm  LV e' lateral:   5.66 cm/s LV PW:         1.10 cm  LV E/e' lateral: 11.4 LV IVS:        1.60 cm  LV e' medial:    8.16  cm/s LVOT diam:     2.00 cm  LV E/e' medial:  7.9 LV SV:         48 LV SV Index:   28 LVOT Area:     3.14 cm  RIGHT VENTRICLE             IVC RV Basal diam:  2.90 cm     IVC diam: 1.00 cm RV S prime:     10.30 cm/s TAPSE (M-mode): 1.8 cm LEFT ATRIUM             Index       RIGHT ATRIUM           Index LA diam:        2.00 cm 1.15 cm/m  RA Area:     10.30 cm LA Vol (A2C):   45.4 ml 26.19 ml/m RA Volume:   24.40 ml  14.07 ml/m LA Vol (A4C):   31.9 ml 18.40 ml/m LA Biplane Vol: 41.2 ml 23.76 ml/m  AORTIC VALVE AV Area (Vmax):    2.48 cm AV Area (Vmean):   2.43 cm AV Area (VTI):     2.40 cm AV Vmax:           115.00 cm/s AV Vmean:          81.200 cm/s AV VTI:            0.199 m AV Peak Grad:      5.3 mmHg AV Mean Grad:      3.0 mmHg LVOT Vmax:         90.70 cm/s LVOT Vmean:        62.800 cm/s LVOT VTI:          0.152 m LVOT/AV VTI ratio: 0.76  AORTA Ao Root diam: 3.50 cm Ao Asc diam:  3.50 cm MITRAL VALVE MV Area (PHT): 2.74 cm  SHUNTS MV Peak grad:  4.9 mmHg     Systemic VTI:  0.15 m MV Mean grad:  2.0 mmHg     Systemic Diam: 2.00 cm MV Vmax:       1.11 m/s MV Vmean:      68.4 cm/s MV Decel Time: 277 msec MV E velocity: 64.30 cm/s MV A velocity: 101.00 cm/s MV E/A ratio:  0.64 Mihai Croitoru MD Electronically signed by Sanda Klein MD Signature Date/Time: 12/24/2019/2:11:21 PM    Final        Subjective: Patient seen and examined the bedside this morning.  Hemodynamically stable for discharge today.  Discharge Exam: Vitals:   12/26/19 0752 12/26/19 1036  BP: (!) 153/92 (!) 152/77  Pulse: 100   Resp: 20   Temp: 97.7 F (36.5 C)   SpO2: 97%    Vitals:   12/26/19 0404 12/26/19 0513 12/26/19 0752 12/26/19 1036  BP: (!) 181/75 (!) 171/85 (!) 153/92 (!) 152/77  Pulse: 93 88 100   Resp: (!) 21 19 20    Temp: 97.8 F (36.6 C) 97.6 F (36.4 C) 97.7 F (36.5 C)   TempSrc: Oral Oral Oral   SpO2: 94% 96% 97%   Weight:      Height:        General: Pt is alert, awake, not in acute  distress Cardiovascular: RRR, S1/S2 +, no rubs, no gallops Respiratory: CTA bilaterally, no wheezing, no rhonchi Abdominal: Soft, NT, ND, bowel sounds + Extremities: no edema, no cyanosis    The results of significant diagnostics from this hospitalization (including imaging, microbiology, ancillary and laboratory) are listed below for reference.     Microbiology: Recent Results (from the past 240 hour(s))  Urine culture     Status: Abnormal   Collection Time: 12/23/19  6:38 PM   Specimen: Urine, Random  Result Value Ref Range Status   Specimen Description   Final    URINE, RANDOM Performed at [facility], [address] [place], [place] [address]    Special Requests   Final    NONE Performed at [facility], [address] [place], [place] [address]    Culture MULTIPLE SPECIES PRESENT, SUGGEST RECOLLECTION (A)  Final   Report Status 12/24/2019 FINAL  Final  SARS Coronavirus 2 by RT PCR (hospital order, performed in [facility] hospital lab) Nasopharyngeal Nasopharyngeal Swab     Status: None   Collection Time: 12/23/19  6:52 PM   Specimen: Nasopharyngeal Swab  Result Value Ref Range Status   SARS Coronavirus 2 NEGATIVE NEGATIVE Final    Comment: (NOTE) SARS-CoV-2 target nucleic acids are NOT DETECTED.  The SARS-CoV-2 RNA is generally detectable in upper and lower respiratory specimens during the acute phase of infection. The lowest concentration of SARS-CoV-2 viral copies this assay can detect is 250 copies / mL. A negative result does not preclude SARS-CoV-2 infection and should not be used as the sole basis for treatment or other patient management decisions.  A negative result may occur with improper specimen collection / handling, submission of specimen other than nasopharyngeal swab, presence of viral mutation(s) within the areas targeted by this assay, and inadequate number of viral copies (<250 copies / mL). A negative result must be  combined with clinical observations, patient history, and epidemiological information.  Fact Sheet for Patients:   StrictlyIdeas.no  Fact Sheet for Healthcare Providers: BankingDealers.co.za  This test is not yet approved or  cleared by the [place] FDA and  has been authorized for detection and/or diagnosis of SARS-CoV-2 by FDA under an Emergency Use Authorization (EUA).  This EUA will remain in effect (meaning this test can be used) for the duration of the COVID-19 declaration under Section 564(b)(1) of the Act, 21 U.S.C. section 360bbb-3(b)(1), unless the authorization is terminated or revoked sooner.  Performed at [facility], [address] [place], [place] [address]      Labs: BNP (last 3 results) No results for input(s): BNP in the last 8760 hours. Basic Metabolic Panel: Recent Labs  Lab 12/23/19 2245 12/24/19 0844 12/24/19 1825 12/24/19 2028 12/25/19 0608  NA 130* 133* 132* 133* 132*  K 3.7 4.0 3.8 4.0 4.3  CL 92* 96* 98 100 100  CO2 28 26 24 22  21*  GLUCOSE 127* 128* 255* 162* 128*  BUN 17 8 11 12 9   CREATININE 0.59 0.75 1.03* 0.92 0.69  CALCIUM 8.6* 9.2 8.7* 8.6* 8.6*  MG 1.1* 2.9*  --   --   --    Liver Function Tests: Recent Labs  Lab 12/23/19 1803  AST 16  ALT 11  ALKPHOS 53  BILITOT 0.3  PROT 6.2*  ALBUMIN 3.5   No results for input(s): LIPASE, AMYLASE in the last 168 hours. No results for input(s): AMMONIA in the last 168 hours. CBC: Recent Labs  Lab 12/23/19 1803  WBC 9.4  NEUTROABS 6.1  HGB 11.3*  HCT 34.8*  MCV 85.5  PLT 394   Cardiac Enzymes: No results for input(s): CKTOTAL, CKMB, CKMBINDEX, TROPONINI in the last 168 hours. BNP: Invalid input(s): POCBNP CBG: Recent Labs  Lab 12/25/19 0627 12/25/19 1119 12/25/19 1715 12/25/19 2136 12/26/19 0618  GLUCAP 115* 114* 122* 150* 125*   D-Dimer No results for input(s): DDIMER in the last 72 hours. Hgb  A1c Recent Labs    12/24/19 0844  HGBA1C 6.9*   Lipid Profile No results for input(s): CHOL, HDL, LDLCALC, TRIG, CHOLHDL, LDLDIRECT in the last 72 hours. Thyroid function studies Recent Labs    12/23/19 2300  TSH 2.729   Anemia work up No results for input(s): VITAMINB12, FOLATE, FERRITIN, TIBC, IRON, RETICCTPCT in the last 72 hours. Urinalysis    Component Value Date/Time   COLORURINE YELLOW 12/23/2019 1838   APPEARANCEUR CLEAR 12/23/2019 1838   LABSPEC 1.010 12/23/2019 1838   PHURINE 6.0 12/23/2019 1838   GLUCOSEU NEGATIVE 12/23/2019 1838   HGBUR NEGATIVE 12/23/2019 1838   BILIRUBINUR NEGATIVE 12/23/2019 1838   KETONESUR NEGATIVE 12/23/2019 1838   PROTEINUR 30 (A) 12/23/2019 1838   UROBILINOGEN 0.2 12/19/2018 1155   NITRITE NEGATIVE 12/23/2019 1838   LEUKOCYTESUR SMALL (A) 12/23/2019 1838   Sepsis Labs Invalid input(s): PROCALCITONIN,  WBC,  LACTICIDVEN Microbiology Recent Results (from the past 240 hour(s))  Urine culture     Status: Abnormal   Collection Time: 12/23/19  6:38 PM   Specimen: Urine, Random  Result Value Ref Range Status   Specimen Description   Final    URINE, RANDOM Performed at [facility], [address] [place], [place] [address]    Special Requests   Final    NONE Performed at [facility], [address] [place], [place] [address]    Culture MULTIPLE SPECIES PRESENT, SUGGEST RECOLLECTION (A)  Final   Report Status 12/24/2019 FINAL  Final  SARS Coronavirus 2 by RT PCR (hospital order, performed in [facility] hospital lab) Nasopharyngeal Nasopharyngeal Swab     Status: None  Collection Time: 12/23/19  6:52 PM   Specimen: Nasopharyngeal Swab  Result Value Ref Range Status   SARS Coronavirus 2 NEGATIVE NEGATIVE Final    Comment: (NOTE) SARS-CoV-2 target nucleic acids are NOT DETECTED.  The SARS-CoV-2 RNA is generally detectable in upper and lower respiratory specimens during the acute phase of infection. The  lowest concentration of SARS-CoV-2 viral copies this assay can detect is 250 copies / mL. A negative result does not preclude SARS-CoV-2 infection and should not be used as the sole basis for treatment or other patient management decisions.  A negative result may occur with improper specimen collection / handling, submission of specimen other than nasopharyngeal swab, presence of viral mutation(s) within the areas targeted by this assay, and inadequate number of viral copies (<250 copies / mL). A negative result must be combined with clinical observations, patient history, and epidemiological information.  Fact Sheet for Patients:   StrictlyIdeas.no  Fact Sheet for Healthcare Providers: BankingDealers.co.za  This test is not yet approved or  cleared by the [place] FDA and has been authorized for detection and/or diagnosis of SARS-CoV-2 by FDA under an Emergency Use Authorization (EUA).  This EUA will remain in effect (meaning this test can be used) for the duration of the COVID-19 declaration under Section 564(b)(1) of the Act, 21 U.S.C. section 360bbb-3(b)(1), unless the authorization is terminated or revoked sooner.  Performed at [facility], [address] [place], [place] [address]     Please note: You were cared for by a hospitalist during your hospital stay. Once you are discharged, your primary care physician will handle any further medical issues. Please note that NO REFILLS for any discharge medications will be authorized once you are discharged, as it is imperative that you return to your primary care physician (or establish a relationship with a primary care physician if you do not have one) for your post hospital discharge needs so that they can reassess your need for medications and monitor your lab values.    Time coordinating discharge: 40 minutes  SIGNED:   Shelly Coss, MD  [facility] 12/26/2019, 11:32 AM Pager [phone]  If 7PM-7AM, please contact night-coverage [email or web address] Password [email or web address]

## 2019-12-26 NOTE — Progress Notes (Signed)
Pt discharged today to home with family.  Pt's IV removed. Pt taken off telemetry and CCMD notified.  Pt left with all of their personal belongings.  AVS documentation reviewed with Pt and all questions answered.   

## 2020-01-03 ENCOUNTER — Other Ambulatory Visit: Payer: Self-pay

## 2020-01-03 DIAGNOSIS — R911 Solitary pulmonary nodule: Secondary | ICD-10-CM | POA: Diagnosis not present

## 2020-01-03 DIAGNOSIS — Z87440 Personal history of urinary (tract) infections: Secondary | ICD-10-CM | POA: Diagnosis not present

## 2020-01-03 DIAGNOSIS — N390 Urinary tract infection, site not specified: Secondary | ICD-10-CM | POA: Diagnosis not present

## 2020-01-03 DIAGNOSIS — Z85828 Personal history of other malignant neoplasm of skin: Secondary | ICD-10-CM | POA: Diagnosis not present

## 2020-01-03 DIAGNOSIS — J471 Bronchiectasis with (acute) exacerbation: Secondary | ICD-10-CM | POA: Diagnosis not present

## 2020-01-03 DIAGNOSIS — M1990 Unspecified osteoarthritis, unspecified site: Secondary | ICD-10-CM | POA: Diagnosis not present

## 2020-01-03 DIAGNOSIS — D649 Anemia, unspecified: Secondary | ICD-10-CM | POA: Diagnosis not present

## 2020-01-03 DIAGNOSIS — I10 Essential (primary) hypertension: Secondary | ICD-10-CM | POA: Diagnosis not present

## 2020-01-03 DIAGNOSIS — Z7982 Long term (current) use of aspirin: Secondary | ICD-10-CM | POA: Diagnosis not present

## 2020-01-03 DIAGNOSIS — J470 Bronchiectasis with acute lower respiratory infection: Secondary | ICD-10-CM | POA: Diagnosis not present

## 2020-01-03 DIAGNOSIS — R29818 Other symptoms and signs involving the nervous system: Secondary | ICD-10-CM | POA: Diagnosis not present

## 2020-01-03 DIAGNOSIS — I119 Hypertensive heart disease without heart failure: Secondary | ICD-10-CM | POA: Diagnosis not present

## 2020-01-03 DIAGNOSIS — R300 Dysuria: Secondary | ICD-10-CM | POA: Diagnosis not present

## 2020-01-03 DIAGNOSIS — E871 Hypo-osmolality and hyponatremia: Secondary | ICD-10-CM | POA: Diagnosis not present

## 2020-01-03 DIAGNOSIS — K219 Gastro-esophageal reflux disease without esophagitis: Secondary | ICD-10-CM | POA: Diagnosis not present

## 2020-01-03 DIAGNOSIS — J181 Lobar pneumonia, unspecified organism: Secondary | ICD-10-CM | POA: Diagnosis not present

## 2020-01-03 DIAGNOSIS — Z8701 Personal history of pneumonia (recurrent): Secondary | ICD-10-CM | POA: Diagnosis not present

## 2020-01-03 DIAGNOSIS — Z7984 Long term (current) use of oral hypoglycemic drugs: Secondary | ICD-10-CM | POA: Diagnosis not present

## 2020-01-03 DIAGNOSIS — E119 Type 2 diabetes mellitus without complications: Secondary | ICD-10-CM | POA: Diagnosis not present

## 2020-01-03 DIAGNOSIS — G4700 Insomnia, unspecified: Secondary | ICD-10-CM | POA: Diagnosis not present

## 2020-01-03 DIAGNOSIS — R2689 Other abnormalities of gait and mobility: Secondary | ICD-10-CM | POA: Diagnosis not present

## 2020-01-03 DIAGNOSIS — Z9181 History of falling: Secondary | ICD-10-CM | POA: Diagnosis not present

## 2020-01-03 NOTE — Patient Outreach (Signed)
[place] [facility]) Care Management  01/03/2020  Katie Alvarado 06-25-1925 [id number]   Red Emmi:  Reason for red emmi alert:  Knows  who to call about changes in condition.---no Date of call 12/28/2019   Placed call to patient with no answer. Left a message requesting a call back.  PLAN: will send unsuccessful outreach letter and call back in 3 business days. Tomasa Rand, RN, BSN, CEN [facility] [employer] [phone]

## 2020-01-05 DIAGNOSIS — R300 Dysuria: Secondary | ICD-10-CM | POA: Diagnosis not present

## 2020-01-05 DIAGNOSIS — M5416 Radiculopathy, lumbar region: Secondary | ICD-10-CM | POA: Diagnosis not present

## 2020-01-05 DIAGNOSIS — E871 Hypo-osmolality and hyponatremia: Secondary | ICD-10-CM | POA: Diagnosis not present

## 2020-01-05 DIAGNOSIS — Z8701 Personal history of pneumonia (recurrent): Secondary | ICD-10-CM | POA: Diagnosis not present

## 2020-01-06 ENCOUNTER — Other Ambulatory Visit: Payer: Self-pay

## 2020-01-06 DIAGNOSIS — N390 Urinary tract infection, site not specified: Secondary | ICD-10-CM | POA: Diagnosis not present

## 2020-01-06 DIAGNOSIS — J470 Bronchiectasis with acute lower respiratory infection: Secondary | ICD-10-CM | POA: Diagnosis not present

## 2020-01-06 DIAGNOSIS — E871 Hypo-osmolality and hyponatremia: Secondary | ICD-10-CM | POA: Diagnosis not present

## 2020-01-06 DIAGNOSIS — J181 Lobar pneumonia, unspecified organism: Secondary | ICD-10-CM | POA: Diagnosis not present

## 2020-01-06 DIAGNOSIS — I119 Hypertensive heart disease without heart failure: Secondary | ICD-10-CM | POA: Diagnosis not present

## 2020-01-06 DIAGNOSIS — J471 Bronchiectasis with (acute) exacerbation: Secondary | ICD-10-CM | POA: Diagnosis not present

## 2020-01-06 NOTE — Patient Outreach (Signed)
[facility]) Care Management  01/06/2020  Katie Alvarado 08-28-1925 [id number]   Red Emmi: 2nd outreach attempt successful.  Patient report she is aware to call Dr. Hulan Fess for changes in condition. Reports she saw him yesterday for followup.   PLAN: close case as no needs identified.   Tomasa Rand, RN, BSN, CEN [facility] [employer] [phone]

## 2020-01-10 DIAGNOSIS — J181 Lobar pneumonia, unspecified organism: Secondary | ICD-10-CM | POA: Diagnosis not present

## 2020-01-10 DIAGNOSIS — N390 Urinary tract infection, site not specified: Secondary | ICD-10-CM | POA: Diagnosis not present

## 2020-01-10 DIAGNOSIS — I119 Hypertensive heart disease without heart failure: Secondary | ICD-10-CM | POA: Diagnosis not present

## 2020-01-10 DIAGNOSIS — E871 Hypo-osmolality and hyponatremia: Secondary | ICD-10-CM | POA: Diagnosis not present

## 2020-01-10 DIAGNOSIS — J470 Bronchiectasis with acute lower respiratory infection: Secondary | ICD-10-CM | POA: Diagnosis not present

## 2020-01-10 DIAGNOSIS — J471 Bronchiectasis with (acute) exacerbation: Secondary | ICD-10-CM | POA: Diagnosis not present

## 2020-01-13 DIAGNOSIS — E871 Hypo-osmolality and hyponatremia: Secondary | ICD-10-CM | POA: Diagnosis not present

## 2020-01-13 DIAGNOSIS — J470 Bronchiectasis with acute lower respiratory infection: Secondary | ICD-10-CM | POA: Diagnosis not present

## 2020-01-13 DIAGNOSIS — I119 Hypertensive heart disease without heart failure: Secondary | ICD-10-CM | POA: Diagnosis not present

## 2020-01-13 DIAGNOSIS — N390 Urinary tract infection, site not specified: Secondary | ICD-10-CM | POA: Diagnosis not present

## 2020-01-13 DIAGNOSIS — J471 Bronchiectasis with (acute) exacerbation: Secondary | ICD-10-CM | POA: Diagnosis not present

## 2020-01-13 DIAGNOSIS — J181 Lobar pneumonia, unspecified organism: Secondary | ICD-10-CM | POA: Diagnosis not present

## 2020-01-18 DIAGNOSIS — J470 Bronchiectasis with acute lower respiratory infection: Secondary | ICD-10-CM | POA: Diagnosis not present

## 2020-01-18 DIAGNOSIS — N390 Urinary tract infection, site not specified: Secondary | ICD-10-CM | POA: Diagnosis not present

## 2020-01-18 DIAGNOSIS — I119 Hypertensive heart disease without heart failure: Secondary | ICD-10-CM | POA: Diagnosis not present

## 2020-01-18 DIAGNOSIS — J471 Bronchiectasis with (acute) exacerbation: Secondary | ICD-10-CM | POA: Diagnosis not present

## 2020-01-18 DIAGNOSIS — E871 Hypo-osmolality and hyponatremia: Secondary | ICD-10-CM | POA: Diagnosis not present

## 2020-01-18 DIAGNOSIS — J181 Lobar pneumonia, unspecified organism: Secondary | ICD-10-CM | POA: Diagnosis not present

## 2020-01-21 DIAGNOSIS — J470 Bronchiectasis with acute lower respiratory infection: Secondary | ICD-10-CM | POA: Diagnosis not present

## 2020-01-21 DIAGNOSIS — N390 Urinary tract infection, site not specified: Secondary | ICD-10-CM | POA: Diagnosis not present

## 2020-01-21 DIAGNOSIS — E871 Hypo-osmolality and hyponatremia: Secondary | ICD-10-CM | POA: Diagnosis not present

## 2020-01-21 DIAGNOSIS — I119 Hypertensive heart disease without heart failure: Secondary | ICD-10-CM | POA: Diagnosis not present

## 2020-01-21 DIAGNOSIS — J181 Lobar pneumonia, unspecified organism: Secondary | ICD-10-CM | POA: Diagnosis not present

## 2020-01-21 DIAGNOSIS — J471 Bronchiectasis with (acute) exacerbation: Secondary | ICD-10-CM | POA: Diagnosis not present

## 2020-01-25 DIAGNOSIS — N390 Urinary tract infection, site not specified: Secondary | ICD-10-CM | POA: Diagnosis not present

## 2020-01-25 DIAGNOSIS — E871 Hypo-osmolality and hyponatremia: Secondary | ICD-10-CM | POA: Diagnosis not present

## 2020-01-25 DIAGNOSIS — J470 Bronchiectasis with acute lower respiratory infection: Secondary | ICD-10-CM | POA: Diagnosis not present

## 2020-01-25 DIAGNOSIS — J181 Lobar pneumonia, unspecified organism: Secondary | ICD-10-CM | POA: Diagnosis not present

## 2020-01-25 DIAGNOSIS — I119 Hypertensive heart disease without heart failure: Secondary | ICD-10-CM | POA: Diagnosis not present

## 2020-01-25 DIAGNOSIS — J471 Bronchiectasis with (acute) exacerbation: Secondary | ICD-10-CM | POA: Diagnosis not present

## 2020-02-02 DIAGNOSIS — J471 Bronchiectasis with (acute) exacerbation: Secondary | ICD-10-CM | POA: Diagnosis not present

## 2020-02-03 DIAGNOSIS — M542 Cervicalgia: Secondary | ICD-10-CM | POA: Diagnosis not present

## 2020-02-03 DIAGNOSIS — M25512 Pain in left shoulder: Secondary | ICD-10-CM | POA: Diagnosis not present

## 2020-02-07 ENCOUNTER — Telehealth: Payer: Self-pay | Admitting: Internal Medicine

## 2020-02-07 DIAGNOSIS — J479 Bronchiectasis, uncomplicated: Secondary | ICD-10-CM

## 2020-02-07 NOTE — Telephone Encounter (Signed)
Spoke with the pt  She states has not felt well since last hospital admission where she had PNA 12/23/19-12/26/19  She is still having SOB, cough with clear sputum and states her left rib area is painful- esp with takes deep breath  She states still taking her albuterol inhaler about once per day and is using perforomist bid  She denies any f/c/s, body aches  She has had her covid vaccine  Please has appt with Dr Annamaria Boots for 02/09/20  Please advise any recs, thanks!  Allergies  Allergen Reactions  . Welchol [Colesevelam Hcl] Hives  . Glucosamine Other (See Comments)    Unknown  . Tramadol Nausea Only  . Celecoxib Hives, Itching and Rash  . Statins Rash   Current Outpatient Medications on File Prior to Visit  Medication Sig Dispense Refill  . acetaminophen (TYLENOL) 325 MG tablet Take 2 tablets (650 mg total) by mouth every 6 (six) hours as needed for pain. (Patient not taking: Reported on 12/24/2019)    . ALPRAZolam (XANAX) 0.25 MG tablet Take 0.25 mg by mouth See admin instructions. 0.25mg  at bedtime And an extra 0.25mg  if upset during the day    . amLODipine (NORVASC) 10 MG tablet Take 1 tablet (10 mg total) by mouth daily. 30 tablet 1  . Artificial Tear Ointment (DRY EYES OP) Apply 1 drop to eye 2 (two) times daily as needed (for dry eyes).     Marland Kitchen aspirin EC 81 MG tablet Take 81 mg by mouth daily. Swallow whole.    . Camphor-Menthol-Methyl Sal (SALONPAS EX) Apply 1 patch topically as needed (pain).    . cholecalciferol (VITAMIN D) 1000 UNITS tablet Take 2,000 Units by mouth daily.     . Cinnamon 500 MG capsule Take 1,000 mg by mouth 2 (two) times daily as needed (for sugar levels).     Marland Kitchen esomeprazole (NEXIUM) 40 MG capsule Take 40 mg by mouth daily.     . feeding supplement, ENSURE ENLIVE, (ENSURE ENLIVE) LIQD Take 237 mLs by mouth 2 (two) times daily between meals. (Patient not taking: Reported on 12/24/2019) 237 mL 12  . formoterol (PERFOROMIST) 20 MCG/2ML nebulizer solution Take 2 mLs (20  mcg total) by nebulization 2 (two) times daily. Dx: J47.9 120 mL 5  . guaiFENesin-dextromethorphan (ROBITUSSIN DM) 100-10 MG/5ML syrup Take 5 mLs by mouth every 4 (four) hours as needed for cough. (Patient not taking: Reported on 12/24/2019) 118 mL 0  . HYDROcodone-homatropine (HYCODAN) 5-1.5 MG/5ML syrup Take 5 mLs by mouth every 6 (six) hours as needed for cough. 240 mL 0  . Lidocaine (HM LIDOCAINE PATCH) 4 % PTCH Apply 1 each topically daily. 15 patch 1  . losartan (COZAAR) 100 MG tablet Take 100 mg by mouth daily.    . Menthol-Methyl Salicylate (MUSCLE RUB) 10-15 % CREA Apply 1 application topically as needed for muscle pain.    . metFORMIN (GLUCOPHAGE) 500 MG tablet Take 1,000 mg by mouth 2 (two) times daily.     . Multiple Vitamins-Minerals (PRESERVISION AREDS 2 PO) Take 1 tablet by mouth in the morning and at bedtime.    . ONE TOUCH ULTRA TEST test strip USE TO CHECK BLOOD SUGAR ONCE DAILY 90  5  . ONETOUCH DELICA LANCETS 96V MISC USE TO CHECK BLOOD SUGAR ONCE DAILY AS DIRECTED  5  . OVER THE COUNTER MEDICATION Apply 1 application topically as needed (arthritis). Horse linimint cream for pain    . polyvinyl alcohol (LIQUIFILM TEARS) 1.4 % ophthalmic solution Place  1 drop into both eyes as needed for dry eyes.    Marland Kitchen PROAIR HFA 108 (90 Base) MCG/ACT inhaler INHALE 2 PUFFS INTO THE LUNGS EVERY 6 HOURS AS NEEDED FOR WHEEZING OR SHORTNESS OF BREATH (Patient taking differently: Inhale 2 puffs into the lungs every 6 (six) hours as needed for wheezing or shortness of breath. ) 8.5 g 1  . Respiratory Therapy Supplies (FLUTTER) DEVI Use as directed 1 each 0  . traMADol (ULTRAM) 50 MG tablet Take 0.5 tablets (25 mg total) by mouth every 6 (six) hours as needed for moderate pain. 30 tablet 1  . vitamin E 400 UNIT capsule Take 400 Units by mouth daily.      No current facility-administered medications on file prior to visit.

## 2020-02-07 NOTE — Telephone Encounter (Signed)
Patient is aware of recommendations and voiced her understanding.  Order has been placed for CXR. Patient will come by tomorrow to have CXR.  Nothing further needed.

## 2020-02-07 NOTE — Telephone Encounter (Signed)
I will see her in days as planned. Please order CXR to be done before I see her    dx  Bronchiectasis exacerbation

## 2020-02-08 ENCOUNTER — Ambulatory Visit (INDEPENDENT_AMBULATORY_CARE_PROVIDER_SITE_OTHER): Payer: Medicare Other

## 2020-02-08 DIAGNOSIS — J479 Bronchiectasis, uncomplicated: Secondary | ICD-10-CM | POA: Diagnosis not present

## 2020-02-08 DIAGNOSIS — J90 Pleural effusion, not elsewhere classified: Secondary | ICD-10-CM | POA: Diagnosis not present

## 2020-02-09 ENCOUNTER — Other Ambulatory Visit: Payer: Self-pay

## 2020-02-09 ENCOUNTER — Encounter: Payer: Self-pay | Admitting: Internal Medicine

## 2020-02-09 ENCOUNTER — Ambulatory Visit (INDEPENDENT_AMBULATORY_CARE_PROVIDER_SITE_OTHER): Payer: Medicare Other | Admitting: Internal Medicine

## 2020-02-09 DIAGNOSIS — Z23 Encounter for immunization: Secondary | ICD-10-CM

## 2020-02-09 DIAGNOSIS — R0789 Other chest pain: Secondary | ICD-10-CM | POA: Diagnosis not present

## 2020-02-09 DIAGNOSIS — J479 Bronchiectasis, uncomplicated: Secondary | ICD-10-CM | POA: Diagnosis not present

## 2020-02-09 NOTE — Progress Notes (Signed)
HPI F never smoker, followed for bronchiectasis/ chronic bronchitis/ [facility], complicated by hx of GERD, DM , arthritis, aortic atherosclerosis Sputum Cx 06/09/12 POS [facility]  Started 07/14/12- Biaxin 500mg  TIW, EMB 1200 TIW, Rifabutin 300 TIW  ended- 08/12/12 due to weakness and malaise,  CT chest 06/01/12- Progressive bilateral nodular bronchiectasis Office spirometry  01/18/13- moderate obstructive airways disease-FVC 2.10/69%, FEV1 1.25/58%,  FEV1/FVC  0.59/ 84%,  FEF 25-75% 0.47/33% --------------------------------------------------------------------------------  02/16/2019-  86 year old female never smoker followed for bronchiectasis/chronic bronchitis/[facility], complicated by history GERD, DM, arthritis,hyponatremia, aortic atherosclerosis, Lumbar DDD/ back pain Hosp w LLL pneumonia and hyponatremia shortly after I saw her last (CXR w/o pneumonia on 9/3 when I saw her), then had tele f/u by NP in Sept. -----Patient reports that she is sob with exertion and weak. She reports a productive cough with clear sputum.  Proair hfa, neb Perforomist Dr Little's office has done f/u labs and CXR, telling her pneumonia resolved.   Denies fever, blood, adenopathy.  Doesn't want to live with daughter or move to assisted living. Trying to hire helpers. CTa chest 9/182020 IMPRESSION: 1. No evidence of acute pulmonary embolism. 2. Left lower lobe consolidation consistent with pneumonia. Followup PA and lateral chest X-ray is recommended in 3-4 weeks following trial of antibiotic therapy to ensure resolution and exclude underlying malignancy. 3. 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 4. Coronary and Aortic Atherosclerosis (ICD10-I70.0).  02/09/20- 86 year old female never smoker followed for bronchiectasis/chronic bronchitis/[facility], complicated by  history GERD, DM2, arthritis,hyponatremia, aortic atherosclerosis, Lumbar DDD/ back pain Proair hfa, neb Perforomist -----pt is hurting on left side of chest and sob of breath was in hospital with pneumonia sept 1st Hosp 9/2-9/5- Weakness, Hyponatremia, CAP,  Covid vax- 2 Phizer Flu vax- today senior Ortho had given prednisone and injected L shoulder. Coughs frequently= chronic, little change, clear mucus. CXR 02/08/20-  IMPRESSION: Stable reticular densities are noted throughout both lungs concerning for scarring, but acute superimposed inflammation cannot be excluded. Aortic Atherosclerosis (ICD10-I70.0).  ROS-see HPI + = positive Constitutional:     +weight loss, night sweats, no-fevers, chills,  +fatigue, lassitude. HEENT:   No-  headaches, difficulty swallowing, +tooth/dental problems, no-sore throat,       No-  sneezing, itching, ear ache, +nasal congestion, post nasal drip, + declining eyesight CV:  + chest pain, orthopnea, PND, swelling in lower extremities, anasarca,  dizziness, palpitations Resp: +Mild  shortness of breath with exertion or at rest.             +productive cough,  + non-productive cough,  No- coughing up of blood.              No-   change in color of mucus.  No- wheezing.   Skin: No-   rash or lesions. GI:  No-   heartburn, indigestion, abdominal pain, nausea, vomiting,  GU:  MS:  +Active low back and bilateral hip pain. L rib and L shoulder pain Neuro-     complaint of generalized weakness without myalgias Psych:  No- change in mood or affect. No depression or anxiety.  No memory loss.  Obj- Physical exam General- Alert, Oriented, Frail, NAD, gracious woman, trying to be stoic Skin- rash-none,  excoriation- none Lymphadenopathy- none Head- atraumatic            Eyes- + decreased vision            Ears- Hearing, canals-normal  Nose- Clear, no-Septal dev, mucus, polyps, erosion, perforation             Throat- Mallampati II , mucosa clear ,  drainage- none, tonsils- atrophic Neck- flexible , trachea midline, no stridor , thyroid nl, carotid no bruit Chest - symmetrical excursion , unlabored           Heart/CV- RRR , no murmur , no gallop  , no rub, nl s1 s2                           - JVD- none , edema- none, stasis changes- none, varices- none           Lung-   Cough+ with deep breath, + light scattered crackles, unlabored, no wheeze,  dullness-none, rub-none. + no crepitus Chest wall- + Point tender L mid/ post axillary line w/ o rub Abd-  Br/ Gen/ Rectal- Not done, not indicated Extrem-   No-clubbing, none, atrophy- none, strength- nl for age. + Cool/dusky feet Neuro- grossly intact to observation

## 2020-02-09 NOTE — Patient Instructions (Signed)
Order- flu vax senior  Suggest for your postnasal drip-   Try otc Claritin (loratadine) antihistamine tabs as needed  Suggest for the cough,try Hall's cough drops  Suggest bracing your ribs with a pillow or folded towel when you cough  Please call if we can help

## 2020-02-10 NOTE — Progress Notes (Signed)
Routed to normal results pool.

## 2020-02-14 ENCOUNTER — Encounter: Payer: Self-pay | Admitting: Internal Medicine

## 2020-02-14 DIAGNOSIS — H353212 Exudative age-related macular degeneration, right eye, with inactive choroidal neovascularization: Secondary | ICD-10-CM | POA: Diagnosis not present

## 2020-02-14 DIAGNOSIS — H43821 Vitreomacular adhesion, right eye: Secondary | ICD-10-CM | POA: Diagnosis not present

## 2020-02-14 DIAGNOSIS — H31092 Other chorioretinal scars, left eye: Secondary | ICD-10-CM | POA: Diagnosis not present

## 2020-02-14 DIAGNOSIS — H35372 Puckering of macula, left eye: Secondary | ICD-10-CM | POA: Diagnosis not present

## 2020-02-14 DIAGNOSIS — H353223 Exudative age-related macular degeneration, left eye, with inactive scar: Secondary | ICD-10-CM | POA: Diagnosis not present

## 2020-02-14 DIAGNOSIS — H35423 Microcystoid degeneration of retina, bilateral: Secondary | ICD-10-CM | POA: Diagnosis not present

## 2020-02-14 DIAGNOSIS — H35433 Paving stone degeneration of retina, bilateral: Secondary | ICD-10-CM | POA: Diagnosis not present

## 2020-02-14 NOTE — Assessment & Plan Note (Signed)
Probable tussive green-stick crack in rib, l post axillary line Plan- Brace with pillow, conservative management, CXR

## 2020-02-14 NOTE — Assessment & Plan Note (Signed)
I don't think there has ben change or progression of lung disease, but general frailty and cracked L rib make it harder.  Plan- flu vax, continue current meds

## 2020-02-16 DIAGNOSIS — F419 Anxiety disorder, unspecified: Secondary | ICD-10-CM | POA: Diagnosis not present

## 2020-02-16 DIAGNOSIS — E118 Type 2 diabetes mellitus with unspecified complications: Secondary | ICD-10-CM | POA: Diagnosis not present

## 2020-02-16 DIAGNOSIS — D509 Iron deficiency anemia, unspecified: Secondary | ICD-10-CM | POA: Diagnosis not present

## 2020-02-16 DIAGNOSIS — R350 Frequency of micturition: Secondary | ICD-10-CM | POA: Diagnosis not present

## 2020-02-16 DIAGNOSIS — G720 Drug-induced myopathy: Secondary | ICD-10-CM | POA: Diagnosis not present

## 2020-02-16 DIAGNOSIS — I10 Essential (primary) hypertension: Secondary | ICD-10-CM | POA: Diagnosis not present

## 2020-02-16 DIAGNOSIS — Z7984 Long term (current) use of oral hypoglycemic drugs: Secondary | ICD-10-CM | POA: Diagnosis not present

## 2020-02-16 DIAGNOSIS — M47816 Spondylosis without myelopathy or radiculopathy, lumbar region: Secondary | ICD-10-CM | POA: Diagnosis not present

## 2020-02-16 DIAGNOSIS — E871 Hypo-osmolality and hyponatremia: Secondary | ICD-10-CM | POA: Diagnosis not present

## 2020-02-16 DIAGNOSIS — E785 Hyperlipidemia, unspecified: Secondary | ICD-10-CM | POA: Diagnosis not present

## 2020-02-16 DIAGNOSIS — J479 Bronchiectasis, uncomplicated: Secondary | ICD-10-CM | POA: Diagnosis not present

## 2020-02-21 ENCOUNTER — Encounter: Payer: Self-pay | Admitting: Cardiology

## 2020-02-21 ENCOUNTER — Ambulatory Visit (INDEPENDENT_AMBULATORY_CARE_PROVIDER_SITE_OTHER): Payer: Medicare Other | Admitting: Cardiology

## 2020-02-21 ENCOUNTER — Other Ambulatory Visit: Payer: Self-pay

## 2020-02-21 VITALS — BP 144/88 | HR 91 | Ht 68.0 in | Wt 130.4 lb

## 2020-02-21 DIAGNOSIS — I10 Essential (primary) hypertension: Secondary | ICD-10-CM

## 2020-02-21 DIAGNOSIS — R002 Palpitations: Secondary | ICD-10-CM

## 2020-02-21 DIAGNOSIS — I444 Left anterior fascicular block: Secondary | ICD-10-CM

## 2020-02-21 NOTE — Patient Instructions (Signed)
Medication Instructions:  The current medical regimen is effective;  continue present plan and medications.  *If you need a refill on your cardiac medications before your next appointment, please call your pharmacy*  Follow-Up: At [facility], you and your health needs are our priority.  As part of our continuing mission to provide you with exceptional heart care, we have created designated Provider Care Teams.  These Care Teams include your primary Cardiologist (physician) and Advanced Practice Providers (APPs -  Physician Assistants and Nurse Practitioners) who all work together to provide you with the care you need, when you need it.  We recommend signing up for the patient portal called "MyChart".  Sign up information is provided on this After Visit Summary.  MyChart is used to connect with patients for Virtual Visits (Telemedicine).  Patients are able to view lab/test results, encounter notes, upcoming appointments, etc.  Non-urgent messages can be sent to your provider as well.   To learn more about what you can do with MyChart, go to [email or web address].    Your next appointment:   12 month(s)  The format for your next appointment:   In Person  Provider:   Mark Skains, MD   Thank you for choosing [facility]!!      

## 2020-02-21 NOTE — Progress Notes (Signed)
Cardiology Office Note:    Date:  02/21/2020   ID:  Katie Alvarado, DOB 03/01/1926, MRN [id number]  PCP:  Hulan Fess, MD  [facility] Cardiologist:  No primary care provider on file.  [facility] Electrophysiologist:  None   Referring MD: Hulan Fess, MD     History of Present Illness:    Katie Alvarado is a 86 y.o. female here for follow-up of bradycardia and shortness of breath.  Has COPD followed by Dr. Annamaria Boots, mild to moderate bilateral renal artery stenosis previously seen by Dr. Fletcher Anon.  Felt a sinking-like sensation but no syncope in the past-a Zio patch monitor was formed on 10/26/2019:  Sinus rhythm with avg HR of 88 BPM  Rare PVC's and PAC's  Rare epidoses of SVT, paroxysmal atrial tachycardia, 15 in total, longest lasting 7 beats - benign  No atrial fibrillation, no pauses  No indications for pacemaker. Reassuring monitor  Hospitalization reviewed.  Echocardiogram reassuring.  Normal pump function. Dr. Janee Morn office note reviewed from pulmonary-posttussive rib pain.  Currently has intermittent shortness of breath with activity.  Probably a constellation of her bronchiectasis and underlying lung condition.  When she took in a deep breath today, she coughed.   Past Medical History:  Diagnosis Date  . Acute nasopharyngitis (common cold)   . Arthritis   . Bronchiectasis with acute exacerbation ([place])   . Cancer ([place])    skin cancer  . COPD (chronic obstructive pulmonary disease) ([place])    sees dr. Annamaria Boots   . Cough   . Diabetes mellitus    fasting 130-150  . Dizziness   . Esophageal reflux   . History of radiation therapy 10/24/17- 12/05/17   Lower lip, 4.4 Gy X 10 fractions given twice weekly for a total dose of 44 Gy.  Marland Kitchen Hypertension   . Insomnia, unspecified   . Other symptoms involving nervous and musculoskeletal systems(781.99)   . Pneumonia    hx of  . Shortness of breath    exertion    Past Surgical History:  Procedure Laterality Date  .  ABDOMINAL HYSTERECTOMY    . ANKLE FRACTURE SURGERY Left   . APPENDECTOMY    . BASAL CELL CARCINOMA EXCISION     NOSE  . CATARACT EXTRACTION, BILATERAL     OUT PATIENT  . COLON SURGERY     colon resection for diverticulitis  . IR EPIDUROGRAPHY  05/07/2018  . LUMBAR LAMINECTOMY/DECOMPRESSION MICRODISCECTOMY N/A 02/15/2013   Procedure: LUMBAR LAMINECTOMY/DECOMPRESSION MICRODISCECTOMY LUMBAR FOUR-FIVE;  Surgeon: Otilio Connors, MD;  Location: [facility];  Service: Neurosurgery;  Laterality: N/A;  . VARICOSE VEIN SURGERY      Current Medications: Current Meds  Medication Sig  . acetaminophen (TYLENOL) 325 MG tablet Take 650 mg by mouth as needed.  Marland Kitchen albuterol (VENTOLIN HFA) 108 (90 Base) MCG/ACT inhaler Inhale into the lungs every 6 (six) hours as needed for wheezing or shortness of breath.  . ALPRAZolam (XANAX) 0.25 MG tablet Take 0.25 mg by mouth See admin instructions. 0.25mg  at bedtime And an extra 0.25mg  if upset during the day  . aspirin EC 81 MG tablet Take 81 mg by mouth daily. Swallow whole.  . budesonide (PULMICORT) 0.25 MG/2ML nebulizer solution Take 0.25 mg by nebulization daily.  . carboxymethylcellulose (REFRESH PLUS) 0.5 % SOLN 1 drop as needed.  . cholecalciferol (VITAMIN D) 1000 UNITS tablet Take 2,000 Units by mouth daily.   . Cinnamon 500 MG capsule Take 1,000 mg by mouth 2 (two) times  daily as needed (for sugar levels).   . cloNIDine (CATAPRES) 0.1 MG tablet Take 0.1 mg by mouth at bedtime.  Marland Kitchen esomeprazole (NEXIUM) 40 MG capsule Take 40 mg by mouth daily.   . formoterol (PERFOROMIST) 20 MCG/2ML nebulizer solution Take 2 mLs (20 mcg total) by nebulization 2 (two) times daily. Dx: J47.9  . guaiFENesin-dextromethorphan (ROBITUSSIN DM) 100-10 MG/5ML syrup Take 5 mLs by mouth every 4 (four) hours as needed for cough.  . losartan (COZAAR) 100 MG tablet Take 100 mg by mouth daily.  . meclizine (ANTIVERT) 12.5 MG tablet Take 12.5 mg by mouth as needed for dizziness.  .  metFORMIN (GLUCOPHAGE) 500 MG tablet Take 1,000 mg by mouth 2 (two) times daily.   . Multiple Vitamins-Minerals (PRESERVISION AREDS 2 PO) Take 1 tablet by mouth in the morning and at bedtime.  . ONE TOUCH ULTRA TEST test strip USE TO CHECK BLOOD SUGAR ONCE DAILY 90  . ONETOUCH DELICA LANCETS 47Q MISC USE TO CHECK BLOOD SUGAR ONCE DAILY AS DIRECTED  . polyvinyl alcohol (LIQUIFILM TEARS) 1.4 % ophthalmic solution Place 1 drop into both eyes as needed for dry eyes.  Marland Kitchen PROAIR HFA 108 (90 Base) MCG/ACT inhaler INHALE 2 PUFFS INTO THE LUNGS EVERY 6 HOURS AS NEEDED FOR WHEEZING OR SHORTNESS OF BREATH  . Respiratory Therapy Supplies (FLUTTER) DEVI Use as directed  . traMADol (ULTRAM) 50 MG tablet Take 0.5 tablets (25 mg total) by mouth every 6 (six) hours as needed for moderate pain.  . vitamin E 400 UNIT capsule Take 400 Units by mouth daily.      Allergies:   Welchol [colesevelam hcl], Amlodipine, Coreg [carvedilol], Doxycycline, Glucosamine, Mobic [meloxicam], Sulfa antibiotics, Telmisartan, Tramadol, Celecoxib, and Statins   Social History   Socioeconomic History  . Marital status: Widowed    Spouse name: Not on file  . Number of children: 2  . Years of education: Not on file  . Highest education level: Not on file  Occupational History  . Not on file  Tobacco Use  . Smoking status: Never Smoker  . Smokeless tobacco: Never Used  Vaping Use  . Vaping Use: Never used  Substance and Sexual Activity  . Alcohol use: Yes    Comment: "Hardly Ever"   . Drug use: No  . Sexual activity: Not Currently    Birth control/protection: Post-menopausal  Other Topics Concern  . Not on file  Social History Narrative  . Not on file   Social Determinants of Health   Financial Resource Strain:   . Difficulty of Paying Living Expenses: Not on file  Food Insecurity:   . Worried About Charity fundraiser in the Last Year: Not on file  . Ran Out of Food in the Last Year: Not on file  Transportation  Needs:   . Lack of Transportation (Medical): Not on file  . Lack of Transportation (Non-Medical): Not on file  Physical Activity:   . Days of Exercise per Week: Not on file  . Minutes of Exercise per Session: Not on file  Stress:   . Feeling of Stress : Not on file  Social Connections:   . Frequency of Communication with Friends and Family: Not on file  . Frequency of Social Gatherings with Friends and Family: Not on file  . Attends Religious Services: Not on file  . Active Member of Clubs or Organizations: Not on file  . Attends Archivist Meetings: Not on file  . Marital Status: Not on file  Family History: The patient's family history includes Chronic bronchitis in her father; Other in her brother and sister.  ROS:   Please see the history of present illness.   No syncope, no bleeding, no orthopnea no PND  All other systems reviewed and are negative.  EKGs/Labs/Other Studies Reviewed:    Recent Labs: 12/23/2019: ALT 11; Hemoglobin 11.3; Platelets 394; TSH 2.729 12/24/2019: Magnesium 2.9 12/25/2019: BUN 9; Creatinine, Ser 0.69; Potassium 4.3; Sodium 132  Recent Lipid Panel No results found for: CHOL, TRIG, HDL, CHOLHDL, VLDL, LDLCALC, LDLDIRECT    Physical Exam:    VS:  BP (!) 144/88   Pulse 91   Ht 5\' 8"  (1.727 m)   Wt 130 lb 6.4 oz (59.1 kg)   SpO2 98%   BMI 19.83 kg/m     Wt Readings from Last 3 Encounters:  02/21/20 130 lb 6.4 oz (59.1 kg)  02/09/20 128 lb 12.8 oz (58.4 kg)  12/23/19 135 lb 12.9 oz (61.6 kg)     GEN:  Well nourished, well developed in no acute distress HEENT: Normal NECK: No JVD; No carotid bruits LYMPHATICS: No lymphadenopathy CARDIAC: RRR, no murmurs, rubs, gallops RESPIRATORY:  Clear to auscultation without rales, wheezing or rhonchi  ABDOMEN: Soft, non-tender, non-distended MUSCULOSKELETAL:  No edema; No deformity  SKIN: Warm and dry NEUROLOGIC:  Alert and oriented x 3 PSYCHIATRIC:  Normal affect   ASSESSMENT:    1.  Palpitations   2. Essential hypertension   3. LAFB (left anterior fascicular block)    PLAN:    In order of problems listed above:  Shortness of breath --ECHO reassuring in hospital. --pain in ribs -Dr. Annamaria Boots evaluated.  -No further cardiac work-up needed.  Continue to promote continued exercise.  Palpitations/sinking feeling/presyncope -Thankfully, Zio patch monitor did not show any adverse arrhythmias.  No need for pacemaker.  Continue to monitor.  Left anterior fascicular block -No further conduction disease noted.  No pauses.  Macular degeneration -No driving.  Hyponatremia -Hospitalized on 12/23/2019 with hyponatremia.  Was dehydrated.  Improved with hydration.  PT recommended home health.  Normal EKG with T wave inversions -Troponin during hospitalization was negative.  Echocardiogram on repeat showed EF of 65%.  Grade 1 diastolic dysfunction.  Overall reassuring.  Essential hypertension -On losartan 100 and amlodipine 10 mg.  Medication Adjustments/Labs and Tests Ordered: Current medicines are reviewed at length with the patient today.  Concerns regarding medicines are outlined above.  No orders of the defined types were placed in this encounter.  No orders of the defined types were placed in this encounter.   Patient Instructions  Medication Instructions:  The current medical regimen is effective;  continue present plan and medications.  *If you need a refill on your cardiac medications before your next appointment, please call your pharmacy*  Follow-Up: At [facility], you and your health needs are our priority.  As part of our continuing mission to provide you with exceptional heart care, we have created designated Provider Care Teams.  These Care Teams include your primary Cardiologist (physician) and Advanced Practice Providers (APPs -  Physician Assistants and Nurse Practitioners) who all work together to provide you with the care you need, when you need  it.  We recommend signing up for the patient portal called "MyChart".  Sign up information is provided on this After Visit Summary.  MyChart is used to connect with patients for Virtual Visits (Telemedicine).  Patients are able to view lab/test results, encounter notes, upcoming appointments, etc.  Non-urgent messages can be sent to your provider as well.   To learn more about what you can do with MyChart, go to [email or web address].    Your next appointment:   12 month(s)  The format for your next appointment:   In Person  Provider:   Candee Furbish, MD   Thank you for choosing [facility]!!        Signed, Candee Furbish, MD  02/21/2020 5:03 PM    [place]

## 2020-02-23 ENCOUNTER — Telehealth: Payer: Self-pay

## 2020-02-23 NOTE — Telephone Encounter (Signed)
NOTES ON FILE FROM [facility] AT [facility], SENT REFERRAL TO [facility]

## 2020-03-14 ENCOUNTER — Telehealth: Payer: Self-pay | Admitting: Cardiology

## 2020-03-14 NOTE — Telephone Encounter (Signed)
Spoke with pt who reports Dr Rex Kras recently changed her medication but because of her vision loss it is hard to tell the name of the medication.  She believes it to be Hydralazine but doesn't know the dosage.  She reports Dr Rex Kras stopped another medication when he started he on this.  She is calling here today because his office told her to follow up with Dr Marlou Porch.   Called Dr Eddie Dibbles office and requested records.  Sonya sent a message to MR for them to fax to me.  Pt aware I will have Dr Marlou Porch to review once the office notes are received.

## 2020-03-14 NOTE — Telephone Encounter (Signed)
Pt c/o BP issue: STAT if pt c/o blurred vision, one-sided weakness or slurred speech  1. What are your last 5 BP readings?  03/11/20 6:00 am: 170/101 03/12/20 7:00 am: 183/84 03/13/20 7:00 am: 187/99   03/14/20 6:00 am: 169/85  2. Are you having any other symptoms (ex. Dizziness, headache, blurred vision, passed out)? Dizziness upon standing,pt just feel like her legs wont work. Her legs feel weak and she can't walk far  3. What is your BP issue? Pt has high BP   Pt said she went to her PCP to get her Singles shot and her BP was high and they would not give it to her. Her PCP wants her to see Dr. Marlou Porch.  Dr. Rex Kras also changed her BP medicine recently and so she thinks it's gotten higher since she changed her medicine

## 2020-03-15 DIAGNOSIS — E871 Hypo-osmolality and hyponatremia: Secondary | ICD-10-CM | POA: Diagnosis not present

## 2020-03-15 DIAGNOSIS — I10 Essential (primary) hypertension: Secondary | ICD-10-CM | POA: Diagnosis not present

## 2020-03-15 DIAGNOSIS — D509 Iron deficiency anemia, unspecified: Secondary | ICD-10-CM | POA: Diagnosis not present

## 2020-03-15 NOTE — Telephone Encounter (Signed)
Dr Marlou Porch spoke with Dr Zadie Rhine regarding pt's HTN and treatment needed.   Suggested adding Amlodipine 5 mg daily, increasing Clonidine to 0.1 mg BID and discontinuing Hydralazine.  Advised pt can follow up in the HTN clinic here if necessary.  Will wait to update medication list once changes have been verified through Dr Radene Ou' documentation.

## 2020-03-27 DIAGNOSIS — L219 Seborrheic dermatitis, unspecified: Secondary | ICD-10-CM | POA: Diagnosis not present

## 2020-03-27 DIAGNOSIS — L570 Actinic keratosis: Secondary | ICD-10-CM | POA: Diagnosis not present

## 2020-03-28 ENCOUNTER — Ambulatory Visit (INDEPENDENT_AMBULATORY_CARE_PROVIDER_SITE_OTHER): Payer: Medicare Other | Admitting: Cardiology

## 2020-03-28 ENCOUNTER — Other Ambulatory Visit: Payer: Self-pay

## 2020-03-28 ENCOUNTER — Encounter: Payer: Self-pay | Admitting: Cardiology

## 2020-03-28 VITALS — BP 150/90 | HR 95 | Ht 68.0 in | Wt 130.0 lb

## 2020-03-28 DIAGNOSIS — R002 Palpitations: Secondary | ICD-10-CM

## 2020-03-28 DIAGNOSIS — I444 Left anterior fascicular block: Secondary | ICD-10-CM | POA: Diagnosis not present

## 2020-03-28 DIAGNOSIS — I10 Essential (primary) hypertension: Secondary | ICD-10-CM | POA: Diagnosis not present

## 2020-03-28 MED ORDER — AMLODIPINE BESYLATE 2.5 MG PO TABS: 2.5000 mg | ORAL_TABLET | Freq: Every day | ORAL | 3 refills | Status: DC

## 2020-03-28 NOTE — Patient Instructions (Signed)
Medication Instructions:  Stop Hydralazine.  Start Amlodipine 2.5 mg once a day. Continue all other medications as listed.  *If you need a refill on your cardiac medications before your next appointment, please call your pharmacy*  Follow-Up: At [facility], you and your health needs are our priority.  As part of our continuing mission to provide you with exceptional heart care, we have created designated Provider Care Teams.  These Care Teams include your primary Cardiologist (physician) and Advanced Practice Providers (APPs -  Physician Assistants and Nurse Practitioners) who all work together to provide you with the care you need, when you need it.  We recommend signing up for the patient portal called "MyChart".  Sign up information is provided on this After Visit Summary.  MyChart is used to connect with patients for Virtual Visits (Telemedicine).  Patients are able to view lab/test results, encounter notes, upcoming appointments, etc.  Non-urgent messages can be sent to your provider as well.   To learn more about what you can do with MyChart, go to [email or web address].    Your next appointment:   4 week(s)  The format for your next appointment:   In Person  Provider:   Hypertension Clinic   Thank you for choosing [facility]!!

## 2020-03-28 NOTE — Progress Notes (Signed)
Cardiology Office Note:    Date:  03/28/2020   ID:  Katie Alvarado, DOB Feb 27, 1926, MRN [id number]  PCP:  Hulan Fess, MD  [facility] Cardiologist:  Candee Furbish, MD  [facility] Electrophysiologist:  None   Referring MD: Hulan Fess, MD     History of Present Illness:    Katie Alvarado is a 86 y.o. female here for follow-up of hypertension at the request of Dr. Rex Kras. Had previously discussed over telephone with Dr. Zadie Rhine who was covering for Dr. Rex Kras at the time.  There was suggestion at that time to perhaps add amlodipine 2.5 mg and increasing clonidine to 0.1 mg twice daily and discontinuing hydralazine.    Today her blood pressure was 150/90.  Previously was 144/88. Clonidine new. Wants to sleep all of the time. No energy. Now off of Clonidine by Dr. Rex Kras. He started Hydralazine as well. 10mg  4 times a day. Can't see she states.  She feels poorly.  Anxious. See below for further details.  Reviewed several blood pressure readings from home.  Sometimes in the 170s.  Her blood pressure cuff at home was slightly higher than blood pressure cuff in the office setting.  I had seen her previously on 02/21/2020 for bradycardia and shortness of breath, also followed by Dr. Annamaria Boots with COPD/bronchiectasis.  Has moderate bilateral renal artery stenosis previously seen by Dr. Fletcher Anon.  Previously had also felt a sinking-like sensation but no syncope.  On 10/26/2019 AZO patch monitor was utilized which showed no adverse arrhythmias.  Rare SVT episodes PAT.  Echocardiogram was reassuring.  Office records from Dr. Rex Kras reviewed.   Past Medical History:  Diagnosis Date  . Acute nasopharyngitis (common cold)   . Arthritis   . Bronchiectasis with acute exacerbation ([place])   . Cancer ([place])    skin cancer  . COPD (chronic obstructive pulmonary disease) ([place])    sees dr. Annamaria Boots   . Cough   . Diabetes mellitus    fasting 130-150  . Dizziness   . Esophageal reflux   . History of  radiation therapy 10/24/17- 12/05/17   Lower lip, 4.4 Gy X 10 fractions given twice weekly for a total dose of 44 Gy.  Marland Kitchen Hypertension   . Insomnia, unspecified   . Other symptoms involving nervous and musculoskeletal systems(781.99)   . Pneumonia    hx of  . Shortness of breath    exertion    Past Surgical History:  Procedure Laterality Date  . ABDOMINAL HYSTERECTOMY    . ANKLE FRACTURE SURGERY Left   . APPENDECTOMY    . BASAL CELL CARCINOMA EXCISION     NOSE  . CATARACT EXTRACTION, BILATERAL     OUT PATIENT  . COLON SURGERY     colon resection for diverticulitis  . IR EPIDUROGRAPHY  05/07/2018  . LUMBAR LAMINECTOMY/DECOMPRESSION MICRODISCECTOMY N/A 02/15/2013   Procedure: LUMBAR LAMINECTOMY/DECOMPRESSION MICRODISCECTOMY LUMBAR FOUR-FIVE;  Surgeon: Otilio Connors, MD;  Location: [facility];  Service: Neurosurgery;  Laterality: N/A;  . VARICOSE VEIN SURGERY      Current Medications: Current Meds  Medication Sig  . acetaminophen (TYLENOL) 325 MG tablet Take 650 mg by mouth as needed.  Marland Kitchen albuterol (VENTOLIN HFA) 108 (90 Base) MCG/ACT inhaler Inhale into the lungs every 6 (six) hours as needed for wheezing or shortness of breath.  . ALPRAZolam (XANAX) 0.25 MG tablet Take 0.25 mg by mouth See admin instructions. 0.25mg  at bedtime And an extra 0.25mg  if upset during the day  .  aspirin EC 81 MG tablet Take 81 mg by mouth daily. Swallow whole.  . budesonide (PULMICORT) 0.25 MG/2ML nebulizer solution Take 0.25 mg by nebulization daily.  . carboxymethylcellulose (REFRESH PLUS) 0.5 % SOLN 1 drop as needed.  . cholecalciferol (VITAMIN D) 1000 UNITS tablet Take 2,000 Units by mouth daily.   . Cinnamon 500 MG capsule Take 1,000 mg by mouth 2 (two) times daily as needed (for sugar levels).   Marland Kitchen esomeprazole (NEXIUM) 40 MG capsule Take 40 mg by mouth daily.   . formoterol (PERFOROMIST) 20 MCG/2ML nebulizer solution Take 2 mLs (20 mcg total) by nebulization 2 (two) times daily. Dx: J47.9  .  guaiFENesin-dextromethorphan (ROBITUSSIN DM) 100-10 MG/5ML syrup Take 5 mLs by mouth every 4 (four) hours as needed for cough.  . losartan (COZAAR) 100 MG tablet Take 100 mg by mouth daily.  . meclizine (ANTIVERT) 12.5 MG tablet Take 12.5 mg by mouth as needed for dizziness.  . metFORMIN (GLUCOPHAGE) 500 MG tablet Take 1,000 mg by mouth 2 (two) times daily.   . Multiple Vitamins-Minerals (PRESERVISION AREDS 2 PO) Take 1 tablet by mouth in the morning and at bedtime.  . ONE TOUCH ULTRA TEST test strip USE TO CHECK BLOOD SUGAR ONCE DAILY 90  . ONETOUCH DELICA LANCETS 10X MISC USE TO CHECK BLOOD SUGAR ONCE DAILY AS DIRECTED  . polyvinyl alcohol (LIQUIFILM TEARS) 1.4 % ophthalmic solution Place 1 drop into both eyes as needed for dry eyes.  Marland Kitchen PROAIR HFA 108 (90 Base) MCG/ACT inhaler INHALE 2 PUFFS INTO THE LUNGS EVERY 6 HOURS AS NEEDED FOR WHEEZING OR SHORTNESS OF BREATH  . Respiratory Therapy Supplies (FLUTTER) DEVI Use as directed  . traMADol (ULTRAM) 50 MG tablet Take 0.5 tablets (25 mg total) by mouth every 6 (six) hours as needed for moderate pain.  . vitamin E 400 UNIT capsule Take 400 Units by mouth daily.   . [DISCONTINUED] cloNIDine (CATAPRES) 0.1 MG tablet Take 0.1 mg by mouth at bedtime.  . [DISCONTINUED] hydrALAZINE (APRESOLINE) 10 MG tablet Take 10 mg by mouth 4 (four) times daily.     Allergies:   Welchol [colesevelam hcl], Amlodipine, Coreg [carvedilol], Doxycycline, Glucosamine, Mobic [meloxicam], Sulfa antibiotics, Telmisartan, Tramadol, Celecoxib, and Statins   Social History   Socioeconomic History  . Marital status: Widowed    Spouse name: Not on file  . Number of children: 2  . Years of education: Not on file  . Highest education level: Not on file  Occupational History  . Not on file  Tobacco Use  . Smoking status: Never Smoker  . Smokeless tobacco: Never Used  Vaping Use  . Vaping Use: Never used  Substance and Sexual Activity  . Alcohol use: Yes    Comment:  "Hardly Ever"   . Drug use: No  . Sexual activity: Not Currently    Birth control/protection: Post-menopausal  Other Topics Concern  . Not on file  Social History Narrative  . Not on file   Social Determinants of Health   Financial Resource Strain:   . Difficulty of Paying Living Expenses: Not on file  Food Insecurity:   . Worried About Charity fundraiser in the Last Year: Not on file  . Ran Out of Food in the Last Year: Not on file  Transportation Needs:   . Lack of Transportation (Medical): Not on file  . Lack of Transportation (Non-Medical): Not on file  Physical Activity:   . Days of Exercise per Week: Not on file  .  Minutes of Exercise per Session: Not on file  Stress:   . Feeling of Stress : Not on file  Social Connections:   . Frequency of Communication with Friends and Family: Not on file  . Frequency of Social Gatherings with Friends and Family: Not on file  . Attends Religious Services: Not on file  . Active Member of Clubs or Organizations: Not on file  . Attends Archivist Meetings: Not on file  . Marital Status: Not on file     Family History: The patient's family history includes Chronic bronchitis in her father; Other in her brother and sister.  ROS:   Please see the history of present illness.    All other systems reviewed and are negative.  EKGs/Labs/Other Studies Reviewed:    The following studies were reviewed today: As above  EKG: Sinus rhythm left anterior fascicular block previous EKG  Recent Labs: 12/23/2019: ALT 11; Hemoglobin 11.3; Platelets 394; TSH 2.729 12/24/2019: Magnesium 2.9 12/25/2019: BUN 9; Creatinine, Ser 0.69; Potassium 4.3; Sodium 132  Recent Lipid Panel No results found for: CHOL, TRIG, HDL, CHOLHDL, VLDL, LDLCALC, LDLDIRECT   Risk Assessment/Calculations:       Physical Exam:    VS:  BP (!) 150/90 (BP Location: Right Arm, Patient Position: Sitting, Cuff Size: Small)   Pulse 95   Ht 5\' 8"  (1.727 m)   Wt 130 lb  (59 kg)   SpO2 95%   BMI 19.77 kg/m     Wt Readings from Last 3 Encounters:  03/28/20 130 lb (59 kg)  02/21/20 130 lb 6.4 oz (59.1 kg)  02/09/20 128 lb 12.8 oz (58.4 kg)     GEN:  Well nourished, well developed in no acute distress HEENT: Normal NECK: No JVD; No carotid bruits LYMPHATICS: No lymphadenopathy CARDIAC: RRR, no murmurs, rubs, gallops RESPIRATORY:  Clear to auscultation without rales, wheezing or rhonchi  ABDOMEN: Soft, non-tender, non-distended MUSCULOSKELETAL:  No edema; No deformity  SKIN: Warm and dry NEUROLOGIC:  Alert and oriented x 3 PSYCHIATRIC:  Normal affect   ASSESSMENT:    1. Essential hypertension   2. LAFB (left anterior fascicular block)   3. Palpitations    PLAN:    In order of problems listed above:  Essential hypertension -Has been at times challenging to control.  Anxious.  Feels poor.  We were asked by Dr. Rex Kras and Dr. Zadie Rhine to assist with hypertension management.  -Had hyponatremia previously.  Trying to avoid HCTZ if possible.  If need to try again, would use low-dose. -Had ankle swelling with amlodipine-however I am willing to try this again at low-dose starting at 2.5 mg once a day.  We will start. -Had extreme fatigue with clonidine 0.1 mg at bedtime. -Continues to have fatigue with hydralazine 10 mg 4 times a day-when she sees the bed she just wants to go back to sleep. -Had intolerance to Coreg  -I would like to continue her losartan 100 mg a day -Start her on amlodipine 2.5 mg once a day-watch for any signs of excessive ankle swelling -Stop hydralazine 10 mg 4 times a day -Take clonidine off of her list.  This has been stopped by Dr. Rex Kras.  -We will set her up in the hypertension clinic in 1 month for follow-up.   Shortness of breath -Echo reviewed from hospital, reassuring.  Has underlying COPD/bronchiectasis.  Left anterior fascicular block -No pauses noted on monitor.  Hyponatremia -Back in September 2021  dehydrated hyponatremic improved with hydration.  Shared Decision Making/Informed Consent        Medication Adjustments/Labs and Tests Ordered: Current medicines are reviewed at length with the patient today.  Concerns regarding medicines are outlined above.  No orders of the defined types were placed in this encounter.  Meds ordered this encounter  Medications  . amLODipine (NORVASC) 2.5 MG tablet    Sig: Take 1 tablet (2.5 mg total) by mouth daily.    Dispense:  90 tablet    Refill:  3    Patient Instructions  Medication Instructions:  Stop Hydralazine.  Start Amlodipine 2.5 mg once a day. Continue all other medications as listed.  *If you need a refill on your cardiac medications before your next appointment, please call your pharmacy*  Follow-Up: At [facility], you and your health needs are our priority.  As part of our continuing mission to provide you with exceptional heart care, we have created designated Provider Care Teams.  These Care Teams include your primary Cardiologist (physician) and Advanced Practice Providers (APPs -  Physician Assistants and Nurse Practitioners) who all work together to provide you with the care you need, when you need it.  We recommend signing up for the patient portal called "MyChart".  Sign up information is provided on this After Visit Summary.  MyChart is used to connect with patients for Virtual Visits (Telemedicine).  Patients are able to view lab/test results, encounter notes, upcoming appointments, etc.  Non-urgent messages can be sent to your provider as well.   To learn more about what you can do with MyChart, go to [email or web address].    Your next appointment:   4 week(s)  The format for your next appointment:   In Person  Provider:   Hypertension Clinic   Thank you for choosing [facility]!!        Signed, Candee Furbish, MD  03/28/2020 10:46 AM    Early

## 2020-04-18 DIAGNOSIS — M25512 Pain in left shoulder: Secondary | ICD-10-CM | POA: Diagnosis not present

## 2020-04-28 ENCOUNTER — Ambulatory Visit (INDEPENDENT_AMBULATORY_CARE_PROVIDER_SITE_OTHER): Payer: Medicare Other | Admitting: Pharmacist

## 2020-04-28 ENCOUNTER — Other Ambulatory Visit: Payer: Self-pay

## 2020-04-28 VITALS — BP 133/78 | HR 85

## 2020-04-28 DIAGNOSIS — I10 Essential (primary) hypertension: Secondary | ICD-10-CM

## 2020-04-28 MED ORDER — IRBESARTAN 300 MG PO TABS: 300.0000 mg | ORAL_TABLET | Freq: Every day | ORAL | 1 refills | Status: DC

## 2020-04-28 NOTE — Patient Instructions (Signed)
It was very nice meeting you!  Your blood pressure looked good today!  We are going to switch your losartan to irbesartan because they are very similar, but the irbesartan is a little stronger  Continue your amlodipine 2.5mg  once a day  Continue to watch your salt intake  Please call us with any questions!  Karren Cobble, PharmD, BCACP, [place], [place] [address] [place], [place] [address] Phone: [phone]; Fax: [phone] 04/28/2020 1:54 PM

## 2020-04-28 NOTE — Progress Notes (Unsigned)
Patient ID: Katie Alvarado                 DOB: 11-02-1925                      MRN: [id number]     HPI: Katie Alvarado is a 87 y.o. female referred by Dr. Marlou Porch to HTN clinic. PMH is significant for HTN, renal artery stenosis, T2DM, COPD,GERD, and hyponatremia (2021). She was last seen by Dr. Marlou Porch in clinic after referral from Dr. Rex Kras (PCP). She has a history of uncontrolled HTN and has tried multiple medication classes. At previous clinic visit, BP was 150/90 while on hydralazine 10 mg QID, losartan 100 mg. Pt reported not being able to see, feeling poorly and anxious. Has a home BP cuff that had readings in the 170's but at clinic visit cuff was noted to be higher. During visit, was continued on losartan 100 mg daily and started on amlodipine 2.5 mg daily. Hydralazine 10 mg QID was stopped at this visit.    Pt presents today in good spirits and with a walker. She states that she is now legally blind. She is frustrated that she cannot complete the activities she likes to do, especially cooking. She used to cook every day and now is not able to because of fear of over-salting her food.She reports not sleeping well and staying awake 3-4 hours each night with no naps during the day.  She endorses having shoulder pain that is in need of replacement but due to age, she will not be getting. Instead, she receives a steroid injection every 6 to 8 weeks and takes tramadol as needed. She reports having a hearing aid but rarely uses it. She does try to do what she can around the house to keep it tidy and also has a neighbor who comes in to help her. She has another friend Katie Alvarado) who helps take her BP on her home BP cuff as she cannot see the reading. Pt brought in her BP log but not the home BP cuff. She states that she takes her amlodipine 2.5 mg daily and losartan 100 mg daily with no issues. Clinic BP today 133/78.   Current HTN meds:  Losartan 100 mg daily Amlodipine 2.5 mg daily  Previously tried:   Amlodipine 10 mg - ankle swelling Carvedilol 6.25 mg BID - dizziness Hydralazine 10 mg QID - vision trouble, fatigued Triamterene-HCTZ 75-50 mg daily Clonidine 0.1 mg bedtime  BP goal: <140/90 mm Hg   Family History: The patient's family history includes Chronic bronchitis in her father; Other in her brother and sister.  Social History: never smoked, widowed for 11 years, daughter lives out of state, has friends that help out with daily activities and appointments  Diet: she eats mostly vegetables, does not like a lot of meat. Drinks water, diet dr pepper and decaf coffee (2 c/day)    Home BP readings:  Jan: 176/91, 170/85, 160/74, 174/83, 138/68157/76, 179/89, 159/74, 176/92 Dec: 150/67, 178/89, 178/83, 197/95, 191/89, 177/82  Recent Labs: 12/25/2019: Na 132, K 4.3, Scr 0.69, CrCl ~ 48 ml/min   Wt Readings from Last 3 Encounters:  03/28/20 130 lb (59 kg)  02/21/20 130 lb 6.4 oz (59.1 kg)  02/09/20 128 lb 12.8 oz (58.4 kg)   BP Readings from Last 3 Encounters:  03/28/20 (!) 150/90  02/21/20 (!) 144/88  02/09/20 120/70   Pulse Readings from Last 3 Encounters:  03/28/20 95  02/21/20 91  02/09/20 91    Renal function: CrCl cannot be calculated (Patient's most recent lab result is older than the maximum 21 days allowed.).  Past Medical History:  Diagnosis Date  . Acute nasopharyngitis (common cold)   . Arthritis   . Bronchiectasis with acute exacerbation ([place])   . Cancer ([place])    skin cancer  . COPD (chronic obstructive pulmonary disease) ([place])    sees dr. Annamaria Boots   . Cough   . Diabetes mellitus    fasting 130-150  . Dizziness   . Esophageal reflux   . History of radiation therapy 10/24/17- 12/05/17   Lower lip, 4.4 Gy X 10 fractions given twice weekly for a total dose of 44 Gy.  Marland Kitchen Hypertension   . Insomnia, unspecified   . Other symptoms involving nervous and musculoskeletal systems(781.99)   . Pneumonia    hx of  . Shortness of breath    exertion     Current Outpatient Medications on File Prior to Visit  Medication Sig Dispense Refill  . acetaminophen (TYLENOL) 325 MG tablet Take 650 mg by mouth as needed.    Marland Kitchen albuterol (VENTOLIN HFA) 108 (90 Base) MCG/ACT inhaler Inhale into the lungs every 6 (six) hours as needed for wheezing or shortness of breath.    . ALPRAZolam (XANAX) 0.25 MG tablet Take 0.25 mg by mouth See admin instructions. 0.25mg  at bedtime And an extra 0.25mg  if upset during the day    . amLODipine (NORVASC) 2.5 MG tablet Take 1 tablet (2.5 mg total) by mouth daily. 90 tablet 3  . aspirin EC 81 MG tablet Take 81 mg by mouth daily. Swallow whole.    . budesonide (PULMICORT) 0.25 MG/2ML nebulizer solution Take 0.25 mg by nebulization daily.    . carboxymethylcellulose (REFRESH PLUS) 0.5 % SOLN 1 drop as needed.    . cholecalciferol (VITAMIN D) 1000 UNITS tablet Take 2,000 Units by mouth daily.     . Cinnamon 500 MG capsule Take 1,000 mg by mouth 2 (two) times daily as needed (for sugar levels).     Marland Kitchen esomeprazole (NEXIUM) 40 MG capsule Take 40 mg by mouth daily.     . formoterol (PERFOROMIST) 20 MCG/2ML nebulizer solution Take 2 mLs (20 mcg total) by nebulization 2 (two) times daily. Dx: J47.9 120 mL 5  . guaiFENesin-dextromethorphan (ROBITUSSIN DM) 100-10 MG/5ML syrup Take 5 mLs by mouth every 4 (four) hours as needed for cough. 118 mL 0  . losartan (COZAAR) 100 MG tablet Take 100 mg by mouth daily.    . meclizine (ANTIVERT) 12.5 MG tablet Take 12.5 mg by mouth as needed for dizziness.    . metFORMIN (GLUCOPHAGE) 500 MG tablet Take 1,000 mg by mouth 2 (two) times daily.     . Multiple Vitamins-Minerals (PRESERVISION AREDS 2 PO) Take 1 tablet by mouth in the morning and at bedtime.    . ONE TOUCH ULTRA TEST test strip USE TO CHECK BLOOD SUGAR ONCE DAILY 90  5  . ONETOUCH DELICA LANCETS 95G MISC USE TO CHECK BLOOD SUGAR ONCE DAILY AS DIRECTED  5  . polyvinyl alcohol (LIQUIFILM TEARS) 1.4 % ophthalmic solution Place 1 drop  into both eyes as needed for dry eyes.    Marland Kitchen PROAIR HFA 108 (90 Base) MCG/ACT inhaler INHALE 2 PUFFS INTO THE LUNGS EVERY 6 HOURS AS NEEDED FOR WHEEZING OR SHORTNESS OF BREATH 8.5 g 1  . Respiratory Therapy Supplies (FLUTTER) DEVI Use as directed 1 each 0  . traMADol (ULTRAM) 50  MG tablet Take 0.5 tablets (25 mg total) by mouth every 6 (six) hours as needed for moderate pain. 30 tablet 1  . vitamin E 400 UNIT capsule Take 400 Units by mouth daily.      No current facility-administered medications on file prior to visit.    Allergies  Allergen Reactions  . Welchol [Colesevelam Hcl] Hives  . Amlodipine     Ankle swelling  . Coreg [Carvedilol]     dizzy  . Doxycycline     rash  . Glucosamine Other (See Comments)    Unknown  . Mobic [Meloxicam]   . Sulfa Antibiotics     itching  . Telmisartan     itching  . Tramadol Nausea Only  . Celecoxib Hives, Itching and Rash  . Statins Rash     Assessment/Plan:  1. Hypertension -  BP in clinic 133/78 mmHg at goal of <140/90 mmHg. Home BP readings elevated and not at goal <140/90 mmgHg. Higher BP goal chosen for her due to age and other current co-morbidities. Per previous cardiology clinic note, home BP cuff may have been reading about ~10 points higher. Encouraged pt and pt's friend Katie Alvarado) to bring in home BP cuff for office calibration. Continued amlodipine 2.5 mg daily. Stopped losartan 100 mg daily. Started irbersartan 300 mg daily. Switched to more potent ARB to get further blood pressure lowering, especially as patient tolerating losartan well. Encouraged patient to continue low salt diet. Will f/u with HTN clinic as needed.   Juluis Pitch, PharmD Candidate  Karren Cobble, PharmD, BCACP, [place], [place] [id number] [place], [place] [address] Phone: [phone]; Fax: [phone] 04/28/2020 5:29 PM

## 2020-05-12 ENCOUNTER — Other Ambulatory Visit: Payer: Self-pay | Admitting: Internal Medicine

## 2020-05-22 ENCOUNTER — Telehealth: Payer: Self-pay | Admitting: Internal Medicine

## 2020-05-22 DIAGNOSIS — D509 Iron deficiency anemia, unspecified: Secondary | ICD-10-CM | POA: Diagnosis not present

## 2020-05-22 MED ORDER — PROMETHAZINE-CODEINE 6.25-10 MG/5ML PO SYRP
5.0000 mL | ORAL_SOLUTION | Freq: Four times a day (QID) | ORAL | 0 refills | Status: DC | PRN
Start: 2020-05-22 — End: 2020-09-14

## 2020-05-22 MED ORDER — HYDROCODONE-HOMATROPINE 5-1.5 MG/5ML PO SYRP: 5.0000 mL | ORAL_SOLUTION | Freq: Four times a day (QID) | ORAL | 0 refills | Status: DC | PRN

## 2020-05-22 NOTE — Telephone Encounter (Signed)
The pharmacy is out of Hycodan and she was wondering what else she could take in its place.   CY please advise

## 2020-05-22 NOTE — Telephone Encounter (Signed)
Hycodan refilled.

## 2020-05-22 NOTE — Telephone Encounter (Signed)
Pt stated that the refill for Hycodan is not in stock at her Pharmacy and was made aware that Dr. Annamaria Boots will need to send in another prescription for something else. Pharmacy: [facility] [place].  [place] regard [phone]

## 2020-05-22 NOTE — Addendum Note (Signed)
Addended by: Baird Lyons D on: 05/22/2020 01:56 PM   Modules accepted: Orders

## 2020-05-22 NOTE — Telephone Encounter (Signed)
I changed script to promethazine with codeine, since drug store is out of Hycodan. Sent to [facility] [place].

## 2020-05-22 NOTE — Telephone Encounter (Signed)
Spoke with the pt  She states continues to have cough since the last visit- Oct 2021, no better or worse  She states that she found old bottle of hycodan that is expired and refill expired on it  She has been using sparingly and feels this helps and she wants new rx sent  She is still using the robitussion syrup and cough drops but feels this is not helping  Pt denies any increased SOB, wheezing, f/c/s  Please advise thanks!  Allergies  Allergen Reactions  . Welchol [Colesevelam Hcl] Hives  . Amlodipine     Ankle swelling  . Coreg [Carvedilol]     dizzy  . Doxycycline     rash  . Glucosamine Other (See Comments)    Unknown  . Mobic [Meloxicam]   . Sulfa Antibiotics     itching  . Telmisartan     itching  . Tramadol Nausea Only  . Celecoxib Hives, Itching and Rash  . Statins Rash   Current Outpatient Medications on File Prior to Visit  Medication Sig Dispense Refill  . acetaminophen (TYLENOL) 325 MG tablet Take 650 mg by mouth as needed.    Marland Kitchen albuterol (VENTOLIN HFA) 108 (90 Base) MCG/ACT inhaler Inhale into the lungs every 6 (six) hours as needed for wheezing or shortness of breath.    . ALPRAZolam (XANAX) 0.25 MG tablet Take 0.25 mg by mouth See admin instructions. 0.25mg  at bedtime And an extra 0.25mg  if upset during the day    . amLODipine (NORVASC) 2.5 MG tablet Take 1 tablet (2.5 mg total) by mouth daily. 90 tablet 3  . aspirin EC 81 MG tablet Take 81 mg by mouth daily. Swallow whole.    . budesonide (PULMICORT) 0.25 MG/2ML nebulizer solution Take 0.25 mg by nebulization daily.    . carboxymethylcellulose (REFRESH PLUS) 0.5 % SOLN 1 drop as needed.    . cholecalciferol (VITAMIN D) 1000 UNITS tablet Take 2,000 Units by mouth daily.     . Cinnamon 500 MG capsule Take 1,000 mg by mouth 2 (two) times daily as needed (for sugar levels).     Marland Kitchen esomeprazole (NEXIUM) 40 MG capsule Take 40 mg by mouth daily.     . formoterol (PERFOROMIST) 20 MCG/2ML nebulizer solution Take 2 mLs  (20 mcg total) by nebulization 2 (two) times daily. Dx: J47.9 120 mL 5  . guaiFENesin-dextromethorphan (ROBITUSSIN DM) 100-10 MG/5ML syrup Take 5 mLs by mouth every 4 (four) hours as needed for cough. 118 mL 0  . irbesartan (AVAPRO) 300 MG tablet Take 1 tablet (300 mg total) by mouth daily. 90 tablet 1  . meclizine (ANTIVERT) 12.5 MG tablet Take 12.5 mg by mouth as needed for dizziness.    . metFORMIN (GLUCOPHAGE) 500 MG tablet Take 1,000 mg by mouth 2 (two) times daily.     . Multiple Vitamins-Minerals (PRESERVISION AREDS 2 PO) Take 1 tablet by mouth in the morning and at bedtime.    . ONE TOUCH ULTRA TEST test strip USE TO CHECK BLOOD SUGAR ONCE DAILY 90  5  . ONETOUCH DELICA LANCETS 57Q MISC USE TO CHECK BLOOD SUGAR ONCE DAILY AS DIRECTED  5  . polyvinyl alcohol (LIQUIFILM TEARS) 1.4 % ophthalmic solution Place 1 drop into both eyes as needed for dry eyes.    Marland Kitchen PROAIR HFA 108 (90 Base) MCG/ACT inhaler INHALE 2 PUFFS INTO THE LUNGS EVERY 6 HOURS AS NEEDED FOR WHEEZING OR SHORTNESS OF BREATH 8.5 each 1  . Respiratory Therapy Supplies (FLUTTER)  DEVI Use as directed 1 each 0  . traMADol (ULTRAM) 50 MG tablet Take 0.5 tablets (25 mg total) by mouth every 6 (six) hours as needed for moderate pain. 30 tablet 1  . vitamin E 400 UNIT capsule Take 400 Units by mouth daily.      No current facility-administered medications on file prior to visit.

## 2020-05-22 NOTE — Telephone Encounter (Signed)
Called and let patient know medication was sent in  Patient voiced understanding Nothing further needed at this time.

## 2020-05-22 NOTE — Telephone Encounter (Signed)
I spoke with the pt and made aware rx refill for Hycodan was sent Nothing further needed

## 2020-05-26 DIAGNOSIS — H31092 Other chorioretinal scars, left eye: Secondary | ICD-10-CM | POA: Diagnosis not present

## 2020-05-26 DIAGNOSIS — H353223 Exudative age-related macular degeneration, left eye, with inactive scar: Secondary | ICD-10-CM | POA: Diagnosis not present

## 2020-05-26 DIAGNOSIS — H43821 Vitreomacular adhesion, right eye: Secondary | ICD-10-CM | POA: Diagnosis not present

## 2020-05-26 DIAGNOSIS — H353212 Exudative age-related macular degeneration, right eye, with inactive choroidal neovascularization: Secondary | ICD-10-CM | POA: Diagnosis not present

## 2020-06-01 DIAGNOSIS — M25512 Pain in left shoulder: Secondary | ICD-10-CM | POA: Diagnosis not present

## 2020-06-30 ENCOUNTER — Other Ambulatory Visit: Payer: Self-pay | Admitting: Internal Medicine

## 2020-07-03 DIAGNOSIS — L820 Inflamed seborrheic keratosis: Secondary | ICD-10-CM | POA: Diagnosis not present

## 2020-07-03 DIAGNOSIS — L570 Actinic keratosis: Secondary | ICD-10-CM | POA: Diagnosis not present

## 2020-07-03 DIAGNOSIS — L219 Seborrheic dermatitis, unspecified: Secondary | ICD-10-CM | POA: Diagnosis not present

## 2020-07-19 ENCOUNTER — Telehealth: Payer: Self-pay | Admitting: Cardiology

## 2020-07-19 NOTE — Telephone Encounter (Signed)
At last HTN visit, switched losartan to irbesartan for better BP control.  Patient had complained of shoulder pain at visit which she receives steroid injections for but had not mentioned leg pain.  Irbesartan not likely to cause pain but recommend d/c to see if it resolves.  If not, recommend PCP visit.  Had previously been tolerating losartan so that would be another option for her

## 2020-07-19 NOTE — Telephone Encounter (Signed)
Called patient in regards to irbesartan.  She reports that she is having BLE pain that she describes as achy.  It began about a month and a half ago.  She said pain starts as soon as she takes medication.  She expresses she has tried to tolerate the pain but just can't anymore.  Per pt she had to take 1/2 of a tramadol and ibuprofen and is still in pain.  She denies swelling and redness to BLE.  She reports recent SBP 160- 170's.   Please advise.

## 2020-07-19 NOTE — Telephone Encounter (Signed)
Pt c/o medication issue:  1. Name of Medication: irbesartan (AVAPRO) 300 MG tablet  2. How are you currently taking this medication (dosage and times per day)? As prescribed   3. Are you having a reaction (difficulty breathing--STAT)? Yes   4. What is your medication issue? Rache is calling stating this medication causes her leg pain from her feet to her knees that is so painful she feels she could cry. States her BP still runs high on this medication in the 160's. Kalla had to take pain medication due to the leg pain being so bad she couldn't stand it anymore today. Please advise.

## 2020-07-21 DIAGNOSIS — K5901 Slow transit constipation: Secondary | ICD-10-CM | POA: Diagnosis not present

## 2020-07-21 NOTE — Telephone Encounter (Signed)
Thanks for the update Agree with plan Candee Furbish, MD

## 2020-08-05 DIAGNOSIS — M79631 Pain in right forearm: Secondary | ICD-10-CM | POA: Diagnosis not present

## 2020-08-05 DIAGNOSIS — M79641 Pain in right hand: Secondary | ICD-10-CM | POA: Diagnosis not present

## 2020-08-08 DIAGNOSIS — M79641 Pain in right hand: Secondary | ICD-10-CM | POA: Diagnosis not present

## 2020-08-08 DIAGNOSIS — M5459 Other low back pain: Secondary | ICD-10-CM | POA: Diagnosis not present

## 2020-08-08 DIAGNOSIS — M25512 Pain in left shoulder: Secondary | ICD-10-CM | POA: Diagnosis not present

## 2020-08-14 ENCOUNTER — Ambulatory Visit: Payer: Medicare Other | Admitting: Internal Medicine

## 2020-08-17 ENCOUNTER — Other Ambulatory Visit: Payer: Self-pay | Admitting: Internal Medicine

## 2020-08-28 DIAGNOSIS — K5900 Constipation, unspecified: Secondary | ICD-10-CM | POA: Diagnosis not present

## 2020-08-28 DIAGNOSIS — K219 Gastro-esophageal reflux disease without esophagitis: Secondary | ICD-10-CM | POA: Diagnosis not present

## 2020-08-28 DIAGNOSIS — E538 Deficiency of other specified B group vitamins: Secondary | ICD-10-CM | POA: Diagnosis not present

## 2020-08-28 DIAGNOSIS — R531 Weakness: Secondary | ICD-10-CM | POA: Diagnosis not present

## 2020-08-28 DIAGNOSIS — R350 Frequency of micturition: Secondary | ICD-10-CM | POA: Diagnosis not present

## 2020-09-01 DIAGNOSIS — J471 Bronchiectasis with (acute) exacerbation: Secondary | ICD-10-CM | POA: Diagnosis not present

## 2020-09-01 DIAGNOSIS — I119 Hypertensive heart disease without heart failure: Secondary | ICD-10-CM | POA: Diagnosis not present

## 2020-09-01 DIAGNOSIS — J479 Bronchiectasis, uncomplicated: Secondary | ICD-10-CM | POA: Diagnosis not present

## 2020-09-01 DIAGNOSIS — D649 Anemia, unspecified: Secondary | ICD-10-CM | POA: Diagnosis not present

## 2020-09-01 DIAGNOSIS — M1990 Unspecified osteoarthritis, unspecified site: Secondary | ICD-10-CM | POA: Diagnosis not present

## 2020-09-01 DIAGNOSIS — M47816 Spondylosis without myelopathy or radiculopathy, lumbar region: Secondary | ICD-10-CM | POA: Diagnosis not present

## 2020-09-01 DIAGNOSIS — E1149 Type 2 diabetes mellitus with other diabetic neurological complication: Secondary | ICD-10-CM | POA: Diagnosis not present

## 2020-09-01 DIAGNOSIS — K219 Gastro-esophageal reflux disease without esophagitis: Secondary | ICD-10-CM | POA: Diagnosis not present

## 2020-09-01 DIAGNOSIS — E785 Hyperlipidemia, unspecified: Secondary | ICD-10-CM | POA: Diagnosis not present

## 2020-09-01 DIAGNOSIS — I701 Atherosclerosis of renal artery: Secondary | ICD-10-CM | POA: Diagnosis not present

## 2020-09-01 DIAGNOSIS — D509 Iron deficiency anemia, unspecified: Secondary | ICD-10-CM | POA: Diagnosis not present

## 2020-09-01 DIAGNOSIS — I10 Essential (primary) hypertension: Secondary | ICD-10-CM | POA: Diagnosis not present

## 2020-09-12 DIAGNOSIS — Z0000 Encounter for general adult medical examination without abnormal findings: Secondary | ICD-10-CM | POA: Diagnosis not present

## 2020-09-13 DIAGNOSIS — R609 Edema, unspecified: Secondary | ICD-10-CM | POA: Diagnosis not present

## 2020-09-13 DIAGNOSIS — H548 Legal blindness, as defined in USA: Secondary | ICD-10-CM | POA: Diagnosis not present

## 2020-09-13 DIAGNOSIS — I10 Essential (primary) hypertension: Secondary | ICD-10-CM | POA: Diagnosis not present

## 2020-09-13 DIAGNOSIS — E1149 Type 2 diabetes mellitus with other diabetic neurological complication: Secondary | ICD-10-CM | POA: Diagnosis not present

## 2020-09-13 DIAGNOSIS — F411 Generalized anxiety disorder: Secondary | ICD-10-CM | POA: Diagnosis not present

## 2020-09-13 DIAGNOSIS — R300 Dysuria: Secondary | ICD-10-CM | POA: Diagnosis not present

## 2020-09-13 NOTE — Progress Notes (Signed)
HPI F never smoker, followed for bronchiectasis/ chronic bronchitis/ [facility], complicated by hx of GERD, DM , arthritis, aortic atherosclerosis Sputum Cx 06/09/12 POS [facility]  Started 07/14/12- Biaxin 500mg  TIW, EMB 1200 TIW, Rifabutin 300 TIW  ended- 08/12/12 due to weakness and malaise,  CT chest 06/01/12- Progressive bilateral nodular bronchiectasis Office spirometry  01/18/13- moderate obstructive airways disease-FVC 2.10/69%, FEV1 1.25/58%,  FEV1/FVC  0.59/ 84%,  FEF 25-75% 0.47/33% --------------------------------------------------------------------------------  02/09/20- 87 year old female never smoker followed for bronchiectasis/chronic bronchitis/[facility], complicated by history GERD, DM2, arthritis,hyponatremia, aortic atherosclerosis, Lumbar DDD/ back pain Proair hfa, neb Perforomist -----pt is hurting on left side of chest and sob of breath was in hospital with pneumonia sept 1st Hosp 9/2-9/5- Weakness, Hyponatremia, CAP,  Covid vax- 2 Phizer Flu vax- today senior Ortho had given prednisone and injected L shoulder. Coughs frequently= chronic, little change, clear mucus. CXR 02/08/20-  IMPRESSION: Stable reticular densities are noted throughout both lungs concerning for scarring, but acute superimposed inflammation cannot be excluded. Aortic Atherosclerosis (ICD10-I70.0).  09/14/20- 87 year old female never smoker followed for Bronchiectasis/chronic bronchitis/[facility], complicated by history GERD, DM2, arthritis,hyponatremia, aortic atherosclerosis, Lumbar DDD/ back pain, HTN,  -Ventolin hfa, neb Perforomist/ Pulmicort/ Atrovent 0.02%, ,  Covid vax- -----6 month follow up, feels "terrible and weak" Chronic cough is waering on her. Tessalon no help. Has a helper staying with her now. Limited activity.  ROS-see HPI + = positive Constitutional:     +weight loss, night sweats, no-fevers, chills,  +fatigue, lassitude. HEENT:   No-  headaches, difficulty swallowing, +tooth/dental problems, no-sore  throat,       No-  sneezing, itching, ear ache, +nasal congestion, post nasal drip, + declining eyesight CV:  + chest pain, orthopnea, PND, swelling in lower extremities, anasarca,  dizziness, palpitations Resp: +Mild  shortness of breath with exertion or at rest.             +productive cough,  + non-productive cough,  No- coughing up of blood.              No-   change in color of mucus.  No- wheezing.   Skin: No-   rash or lesions. GI:  No-   heartburn, indigestion, abdominal pain, nausea, vomiting,  GU:  MS:  +Active low back and bilateral hip pain. L rib and L shoulder pain Neuro-     complaint of generalized weakness without myalgias Psych:  No- change in mood or affect. No depression or anxiety.  No memory loss.  Obj- Physical exam General- Alert, Oriented, Frail, NAD, gracious woman, trying to be stoic,  Skin- rash-none,  excoriation- none Lymphadenopathy- none Head- atraumatic            Eyes- + decreased vision            Ears- Hearing, canals-normal            Nose- Clear, no-Septal dev, mucus, polyps, erosion, perforation             Throat- Mallampati II , mucosa clear , drainage- none, tonsils- atrophic Neck- flexible , trachea midline, no stridor , thyroid nl, carotid no bruit Chest - symmetrical excursion , unlabored           Heart/CV- RRR , no murmur , no gallop  , no rub, nl s1 s2                           - JVD- none , edema- none, stasis changes-  none, varices- none           Lung-   Cough+ with deep breath, + few rhonchi L back, no wheeze,  dullness-none, rub-none. + no crepitus Chest wall-  Abd-  Br/ Gen/ Rectal- Not done, not indicated Extrem-   No-clubbing, none, atrophy- none, strength- nl for age. + Cool/dusky feet Neuro- grossly intact to observation

## 2020-09-14 ENCOUNTER — Ambulatory Visit (INDEPENDENT_AMBULATORY_CARE_PROVIDER_SITE_OTHER): Payer: Medicare Other | Admitting: Internal Medicine

## 2020-09-14 ENCOUNTER — Encounter: Payer: Self-pay | Admitting: Internal Medicine

## 2020-09-14 ENCOUNTER — Other Ambulatory Visit: Payer: Self-pay

## 2020-09-14 DIAGNOSIS — R911 Solitary pulmonary nodule: Secondary | ICD-10-CM | POA: Insufficient documentation

## 2020-09-14 DIAGNOSIS — K219 Gastro-esophageal reflux disease without esophagitis: Secondary | ICD-10-CM

## 2020-09-14 DIAGNOSIS — R809 Proteinuria, unspecified: Secondary | ICD-10-CM | POA: Insufficient documentation

## 2020-09-14 DIAGNOSIS — G720 Drug-induced myopathy: Secondary | ICD-10-CM | POA: Insufficient documentation

## 2020-09-14 DIAGNOSIS — Z8701 Personal history of pneumonia (recurrent): Secondary | ICD-10-CM | POA: Insufficient documentation

## 2020-09-14 DIAGNOSIS — M1710 Unilateral primary osteoarthritis, unspecified knee: Secondary | ICD-10-CM | POA: Insufficient documentation

## 2020-09-14 DIAGNOSIS — J479 Bronchiectasis, uncomplicated: Secondary | ICD-10-CM | POA: Diagnosis not present

## 2020-09-14 DIAGNOSIS — M109 Gout, unspecified: Secondary | ICD-10-CM | POA: Insufficient documentation

## 2020-09-14 DIAGNOSIS — R2689 Other abnormalities of gait and mobility: Secondary | ICD-10-CM | POA: Insufficient documentation

## 2020-09-14 DIAGNOSIS — M5416 Radiculopathy, lumbar region: Secondary | ICD-10-CM | POA: Insufficient documentation

## 2020-09-14 DIAGNOSIS — H548 Legal blindness, as defined in USA: Secondary | ICD-10-CM | POA: Insufficient documentation

## 2020-09-14 DIAGNOSIS — E559 Vitamin D deficiency, unspecified: Secondary | ICD-10-CM | POA: Insufficient documentation

## 2020-09-14 DIAGNOSIS — E785 Hyperlipidemia, unspecified: Secondary | ICD-10-CM | POA: Insufficient documentation

## 2020-09-14 DIAGNOSIS — M47816 Spondylosis without myelopathy or radiculopathy, lumbar region: Secondary | ICD-10-CM | POA: Insufficient documentation

## 2020-09-14 DIAGNOSIS — Z8589 Personal history of malignant neoplasm of other organs and systems: Secondary | ICD-10-CM | POA: Insufficient documentation

## 2020-09-14 DIAGNOSIS — I119 Hypertensive heart disease without heart failure: Secondary | ICD-10-CM | POA: Insufficient documentation

## 2020-09-14 DIAGNOSIS — D509 Iron deficiency anemia, unspecified: Secondary | ICD-10-CM | POA: Insufficient documentation

## 2020-09-14 DIAGNOSIS — M1120 Other chondrocalcinosis, unspecified site: Secondary | ICD-10-CM | POA: Insufficient documentation

## 2020-09-14 DIAGNOSIS — F419 Anxiety disorder, unspecified: Secondary | ICD-10-CM | POA: Insufficient documentation

## 2020-09-14 DIAGNOSIS — M179 Osteoarthritis of knee, unspecified: Secondary | ICD-10-CM | POA: Insufficient documentation

## 2020-09-14 DIAGNOSIS — R609 Edema, unspecified: Secondary | ICD-10-CM | POA: Insufficient documentation

## 2020-09-14 MED ORDER — PROMETHAZINE-CODEINE 6.25-10 MG/5ML PO SYRP: 5.0000 mL | ORAL_SOLUTION | Freq: Four times a day (QID) | ORAL | 0 refills | Status: DC | PRN

## 2020-09-14 NOTE — Patient Instructions (Signed)
Script sent for codeine cough syrup. As discussed- please be very sparing and use just a little of this if needed. Don't want it to make you drowsy or unsteady to fall and hurt yourself.  Please call if I can help

## 2020-09-25 DIAGNOSIS — L570 Actinic keratosis: Secondary | ICD-10-CM | POA: Diagnosis not present

## 2020-09-25 DIAGNOSIS — L219 Seborrheic dermatitis, unspecified: Secondary | ICD-10-CM | POA: Diagnosis not present

## 2020-09-26 DIAGNOSIS — H353223 Exudative age-related macular degeneration, left eye, with inactive scar: Secondary | ICD-10-CM | POA: Diagnosis not present

## 2020-09-26 DIAGNOSIS — H43821 Vitreomacular adhesion, right eye: Secondary | ICD-10-CM | POA: Diagnosis not present

## 2020-09-26 DIAGNOSIS — H31092 Other chorioretinal scars, left eye: Secondary | ICD-10-CM | POA: Diagnosis not present

## 2020-09-26 DIAGNOSIS — H353212 Exudative age-related macular degeneration, right eye, with inactive choroidal neovascularization: Secondary | ICD-10-CM | POA: Diagnosis not present

## 2020-10-07 DIAGNOSIS — S80812A Abrasion, left lower leg, initial encounter: Secondary | ICD-10-CM | POA: Diagnosis not present

## 2020-10-11 DIAGNOSIS — R110 Nausea: Secondary | ICD-10-CM | POA: Diagnosis not present

## 2020-10-11 DIAGNOSIS — M7989 Other specified soft tissue disorders: Secondary | ICD-10-CM | POA: Diagnosis not present

## 2020-10-11 DIAGNOSIS — I10 Essential (primary) hypertension: Secondary | ICD-10-CM | POA: Diagnosis not present

## 2020-10-11 DIAGNOSIS — M79606 Pain in leg, unspecified: Secondary | ICD-10-CM | POA: Diagnosis not present

## 2020-10-18 ENCOUNTER — Other Ambulatory Visit: Payer: Self-pay | Admitting: Cardiology

## 2020-10-18 DIAGNOSIS — I10 Essential (primary) hypertension: Secondary | ICD-10-CM

## 2020-10-19 ENCOUNTER — Other Ambulatory Visit (HOSPITAL_COMMUNITY): Payer: Self-pay | Admitting: Family Medicine

## 2020-10-19 DIAGNOSIS — R52 Pain, unspecified: Secondary | ICD-10-CM

## 2020-10-25 ENCOUNTER — Telehealth: Payer: Self-pay | Admitting: Cardiology

## 2020-10-25 ENCOUNTER — Ambulatory Visit (HOSPITAL_COMMUNITY)
Admission: RE | Admit: 2020-10-25 | Discharge: 2020-10-25 | Disposition: A | Discharge status: Home / Self Care | Payer: Medicare Other | Source: Ambulatory Visit | Attending: Family Medicine | Admitting: Family Medicine

## 2020-10-25 ENCOUNTER — Other Ambulatory Visit: Payer: Self-pay

## 2020-10-25 DIAGNOSIS — R52 Pain, unspecified: Secondary | ICD-10-CM | POA: Diagnosis not present

## 2020-10-25 NOTE — Telephone Encounter (Signed)
Pt c/o medication issue:  1. Name of Medication: irbesartan (AVAPRO) 300 MG tablet  2. How are you currently taking this medication (dosage and times per day)? As prescribed   3. Are you having a reaction (difficulty breathing--STAT)? Yes  4. What is your medication issue? Believes it is causing swelling   Pt c/o swelling: STAT is pt has developed SOB within 24 hours  If swelling, where is the swelling located? Feet and ankles   How much weight have you gained and in what time span? Doesn't think so   Have you gained 3 pounds in a day or 5 pounds in a week? Doesn't think so   Do you have a log of your daily weights (if so, list)? No   Are you currently taking a fluid pill? Yes  Are you currently SOB? No more than normal   Have you traveled recently? No   States it hurts so bad she wants to scream and cry. States it does not go down at night.

## 2020-10-25 NOTE — Telephone Encounter (Signed)
Error

## 2020-10-25 NOTE — Progress Notes (Signed)
ABI exam has been completed.  Patient's BP was very elevated (205/94).  Spoke with Sharee Pimple, RN at Dr. Alan Ripper office and patient is ok to go home, will contact to adjust medication.  Results can be found under chart review under CV PROC. 10/25/2020 3:02 PM Arden Tinoco RVT, RDMS

## 2020-10-25 NOTE — Telephone Encounter (Signed)
Called and spoke with the pt and she reports that her feet and ankles have been swollen for several days and they are discolored and very painful that she cannot walk. She is having an arterial duplex today with ABI's per her PCP , Dr. Melinda Crutch. She sounds very SOB on the phone and she was coughing persistently.   Pt says she her cough is chronic but it was difficult for her to have a conversation without coughing very hard. She denies that this is worsening compared to her "normal". She does not weigh herself so unsure of any unusual weight gain.  She also says her SOB is consistent with her "normal" and not any worse than usual but her pain in her lower extremities is so bad she sometimes cries out in pain. She takes Tylenol for the pain and elevation does not help with the edema.   I have urged her to keep her appt today for her vascular study. I have asked her to contact Dr. Harrington Challenger her PCP re: the increased pain and I have also advised her to got the ED if it is so intense that she cries. She has her friend, Fritz Pickerel, picking her up soon and will consider her options after her appt.   I will forward to Dr. Marlou Porch for review.   Last OV 03/2020 Echo 12/24/2019 EF 60-65%

## 2020-10-26 DIAGNOSIS — S20221A Contusion of right back wall of thorax, initial encounter: Secondary | ICD-10-CM | POA: Diagnosis not present

## 2020-10-26 DIAGNOSIS — W03XXXA Other fall on same level due to collision with another person, initial encounter: Secondary | ICD-10-CM | POA: Diagnosis not present

## 2020-10-30 DIAGNOSIS — R350 Frequency of micturition: Secondary | ICD-10-CM | POA: Diagnosis not present

## 2020-10-30 DIAGNOSIS — I10 Essential (primary) hypertension: Secondary | ICD-10-CM | POA: Diagnosis not present

## 2020-11-17 DIAGNOSIS — E871 Hypo-osmolality and hyponatremia: Secondary | ICD-10-CM | POA: Diagnosis not present

## 2020-11-17 DIAGNOSIS — R21 Rash and other nonspecific skin eruption: Secondary | ICD-10-CM | POA: Diagnosis not present

## 2020-11-17 DIAGNOSIS — R531 Weakness: Secondary | ICD-10-CM | POA: Diagnosis not present

## 2020-11-17 DIAGNOSIS — I10 Essential (primary) hypertension: Secondary | ICD-10-CM | POA: Diagnosis not present

## 2020-11-17 DIAGNOSIS — J479 Bronchiectasis, uncomplicated: Secondary | ICD-10-CM | POA: Diagnosis not present

## 2020-11-17 DIAGNOSIS — H548 Legal blindness, as defined in USA: Secondary | ICD-10-CM | POA: Diagnosis not present

## 2020-11-17 DIAGNOSIS — D649 Anemia, unspecified: Secondary | ICD-10-CM | POA: Diagnosis not present

## 2020-11-17 DIAGNOSIS — E1149 Type 2 diabetes mellitus with other diabetic neurological complication: Secondary | ICD-10-CM | POA: Diagnosis not present

## 2020-11-17 DIAGNOSIS — R350 Frequency of micturition: Secondary | ICD-10-CM | POA: Diagnosis not present

## 2020-11-17 DIAGNOSIS — R5383 Other fatigue: Secondary | ICD-10-CM | POA: Diagnosis not present

## 2020-12-20 DIAGNOSIS — J471 Bronchiectasis with (acute) exacerbation: Secondary | ICD-10-CM | POA: Diagnosis not present

## 2020-12-20 DIAGNOSIS — I119 Hypertensive heart disease without heart failure: Secondary | ICD-10-CM | POA: Diagnosis not present

## 2020-12-20 DIAGNOSIS — E1149 Type 2 diabetes mellitus with other diabetic neurological complication: Secondary | ICD-10-CM | POA: Diagnosis not present

## 2020-12-20 DIAGNOSIS — K219 Gastro-esophageal reflux disease without esophagitis: Secondary | ICD-10-CM | POA: Diagnosis not present

## 2020-12-20 DIAGNOSIS — I10 Essential (primary) hypertension: Secondary | ICD-10-CM | POA: Diagnosis not present

## 2020-12-20 DIAGNOSIS — E119 Type 2 diabetes mellitus without complications: Secondary | ICD-10-CM | POA: Diagnosis not present

## 2020-12-20 DIAGNOSIS — M1990 Unspecified osteoarthritis, unspecified site: Secondary | ICD-10-CM | POA: Diagnosis not present

## 2020-12-20 DIAGNOSIS — J479 Bronchiectasis, uncomplicated: Secondary | ICD-10-CM | POA: Diagnosis not present

## 2020-12-20 DIAGNOSIS — E785 Hyperlipidemia, unspecified: Secondary | ICD-10-CM | POA: Diagnosis not present

## 2020-12-20 DIAGNOSIS — D509 Iron deficiency anemia, unspecified: Secondary | ICD-10-CM | POA: Diagnosis not present

## 2020-12-27 ENCOUNTER — Other Ambulatory Visit: Payer: Self-pay | Admitting: Family Medicine

## 2020-12-27 ENCOUNTER — Ambulatory Visit
Admission: RE | Admit: 2020-12-27 | Discharge: 2020-12-27 | Disposition: A | Discharge status: Home / Self Care | Payer: Medicare Other | Source: Ambulatory Visit | Attending: Family Medicine | Admitting: Family Medicine

## 2020-12-27 ENCOUNTER — Other Ambulatory Visit: Payer: Self-pay

## 2020-12-27 DIAGNOSIS — J479 Bronchiectasis, uncomplicated: Secondary | ICD-10-CM | POA: Diagnosis not present

## 2020-12-27 DIAGNOSIS — J439 Emphysema, unspecified: Secondary | ICD-10-CM | POA: Diagnosis not present

## 2020-12-27 DIAGNOSIS — R918 Other nonspecific abnormal finding of lung field: Secondary | ICD-10-CM | POA: Diagnosis not present

## 2020-12-27 DIAGNOSIS — R531 Weakness: Secondary | ICD-10-CM | POA: Diagnosis not present

## 2020-12-27 DIAGNOSIS — R059 Cough, unspecified: Secondary | ICD-10-CM | POA: Diagnosis not present

## 2020-12-27 DIAGNOSIS — Z87440 Personal history of urinary (tract) infections: Secondary | ICD-10-CM | POA: Diagnosis not present

## 2020-12-27 DIAGNOSIS — J3489 Other specified disorders of nose and nasal sinuses: Secondary | ICD-10-CM | POA: Diagnosis not present

## 2020-12-27 DIAGNOSIS — E871 Hypo-osmolality and hyponatremia: Secondary | ICD-10-CM | POA: Diagnosis not present

## 2020-12-27 DIAGNOSIS — D649 Anemia, unspecified: Secondary | ICD-10-CM | POA: Diagnosis not present

## 2020-12-27 DIAGNOSIS — R350 Frequency of micturition: Secondary | ICD-10-CM | POA: Diagnosis not present

## 2020-12-28 ENCOUNTER — Telehealth: Payer: Self-pay | Admitting: Internal Medicine

## 2020-12-28 MED ORDER — PROMETHAZINE-CODEINE 6.25-10 MG/5ML PO SYRP: 5.0000 mL | ORAL_SOLUTION | Freq: Four times a day (QID) | ORAL | 0 refills | Status: DC | PRN

## 2020-12-28 NOTE — Telephone Encounter (Signed)
ATC, left detailed message according to patient regarding Dr. Thomes Cake and to call if symptoms persist/worsen. Nothing further needed at this time.

## 2020-12-28 NOTE — Telephone Encounter (Signed)
Cough syrup refilled at [facility] her to be ever sparing so it doesn't make her sleepy or dizzy.

## 2020-12-28 NOTE — Telephone Encounter (Signed)
Primary Pulmonologist: Dr. Annamaria Boots Last office visit and with whom: 09/14/20 with Dr.Young What do we see them for (pulmonary problems): Bronchiectasis without complication Last OV assessment/plan: see below  Was appointment offered to patient (explain)?    Instructions   Return in about 6 months (around 03/17/2021). Script sent for codeine cough syrup. As discussed- please be very sparing and use just a little of this if needed. Don't want it to make you drowsy or unsteady to fall and hurt yourself.   Please call if I can help          Reason for call:  I called and spoke with patient regarding message. Patient has been having worsening cough with clear mucous production and occ chest tightness. No other symptoms. Just weak from so much coughing. Using neb meds 3-4 times daily and rescue inhaler 3-4 times daily with no relief. Tried used Robtussin with no relief. Requesting cough syrup. Will route to Dr. Annamaria Boots.  Dr. Annamaria Boots, please advise  (examples of things to ask: : When did symptoms start? Fever? Cough? Productive? Color to sputum? More sputum than usual? Wheezing? Have you needed increased oxygen? Are you taking your respiratory medications? What over the counter measures have you tried?)  Allergies  Allergen Reactions   Welchol [Colesevelam Hcl] Hives   Amlodipine     Ankle swelling   Atorvastatin     Other reaction(s): foot pain   Coreg [Carvedilol]     dizzy   Doxycycline     rash   Glucosamine Other (See Comments)    Unknown   Mobic [Meloxicam]    Pravastatin Sodium     Other reaction(s): rash   Rosuvastatin Calcium     Other reaction(s): itching   Sulfa Antibiotics     itching   Telmisartan     itching   Tramadol Nausea Only   Celecoxib Hives, Itching and Rash   Statins Rash    Immunization History  Administered Date(s) Administered   Fluad Quad(high Dose 65+) 12/24/2018   Influenza Split 01/04/2009, 01/21/2011, 01/02/2012, 01/20/2013, 01/21/2016,  01/20/2017, 12/24/2018, 02/11/2020   Influenza Whole 02/02/2010   Influenza, High Dose Seasonal PF 01/20/2018, 02/09/2020   Influenza,inj,Quad PF,6+ Mos 01/16/2015   Influenza-Unspecified 12/21/2013   PFIZER(Purple Top)SARS-COV-2 Vaccination 06/03/2019, 06/28/2019   Pneumococcal Conjugate-13 09/08/2013   Pneumococcal Polysaccharide-23 03/22/2004, 09/06/2008, 07/24/2009, 02/16/2019   Td 03/22/2004   Tdap 01/06/2013

## 2020-12-29 ENCOUNTER — Telehealth: Payer: Self-pay | Admitting: Internal Medicine

## 2020-12-29 MED ORDER — BENZONATATE 200 MG PO CAPS: 200.0000 mg | ORAL_CAPSULE | Freq: Three times a day (TID) | ORAL | 1 refills | Status: DC | PRN

## 2020-12-29 NOTE — Telephone Encounter (Signed)
See message from yesterday from Dr. Annamaria Boots   Can take Liquid Mucinex DM Twice daily  As needed  cough  Add Tessalon Three times a day  As needed  cough #30 1 refill  If not improving will need ov  Please contact office for sooner follow up if symptoms do not improve or worsen or seek emergency care

## 2020-12-29 NOTE — Telephone Encounter (Signed)
Called and spoke with patient and went over recs from [employer]. Patient and husband expressed understanding. Reviewed upcoming appointment information again with patient. Nothing further needed at this time.

## 2020-12-29 NOTE — Telephone Encounter (Signed)
Called and spoke with patient who states that for about 3 weeks she has been having a cough. Sometimes goes on for 2-3 hours and is non-stop and sometimes comes and goes. States that she has been taking Promethazine with codeine but does not want to keep taking it if it not good for her and does not want to  Pharmacy is [facility].  Tammy please advise

## 2021-01-01 ENCOUNTER — Inpatient Hospital Stay (HOSPITAL_BASED_OUTPATIENT_CLINIC_OR_DEPARTMENT_OTHER)
Admission: EM | Admit: 2021-01-01 | Discharge: 2021-01-05 | DRG: 194 | Disposition: A | Discharge status: Home / Self Care | Payer: Medicare Other | Attending: Internal Medicine | Admitting: Internal Medicine

## 2021-01-01 ENCOUNTER — Encounter (HOSPITAL_BASED_OUTPATIENT_CLINIC_OR_DEPARTMENT_OTHER): Payer: Self-pay | Admitting: *Deleted

## 2021-01-01 ENCOUNTER — Emergency Department (HOSPITAL_BASED_OUTPATIENT_CLINIC_OR_DEPARTMENT_OTHER): Payer: Medicare Other | Admitting: Radiology

## 2021-01-01 ENCOUNTER — Other Ambulatory Visit: Payer: Self-pay

## 2021-01-01 ENCOUNTER — Telehealth: Payer: Self-pay | Admitting: Internal Medicine

## 2021-01-01 DIAGNOSIS — H548 Legal blindness, as defined in USA: Secondary | ICD-10-CM | POA: Diagnosis present

## 2021-01-01 DIAGNOSIS — Z888 Allergy status to other drugs, medicaments and biological substances status: Secondary | ICD-10-CM | POA: Diagnosis not present

## 2021-01-01 DIAGNOSIS — I5032 Chronic diastolic (congestive) heart failure: Secondary | ICD-10-CM | POA: Diagnosis present

## 2021-01-01 DIAGNOSIS — E1165 Type 2 diabetes mellitus with hyperglycemia: Secondary | ICD-10-CM | POA: Diagnosis not present

## 2021-01-01 DIAGNOSIS — Z9841 Cataract extraction status, right eye: Secondary | ICD-10-CM | POA: Diagnosis not present

## 2021-01-01 DIAGNOSIS — J180 Bronchopneumonia, unspecified organism: Secondary | ICD-10-CM | POA: Diagnosis not present

## 2021-01-01 DIAGNOSIS — J441 Chronic obstructive pulmonary disease with (acute) exacerbation: Secondary | ICD-10-CM | POA: Diagnosis not present

## 2021-01-01 DIAGNOSIS — Z961 Presence of intraocular lens: Secondary | ICD-10-CM | POA: Diagnosis present

## 2021-01-01 DIAGNOSIS — Z681 Body mass index (BMI) 19 or less, adult: Secondary | ICD-10-CM

## 2021-01-01 DIAGNOSIS — Z79899 Other long term (current) drug therapy: Secondary | ICD-10-CM

## 2021-01-01 DIAGNOSIS — R630 Anorexia: Secondary | ICD-10-CM | POA: Diagnosis present

## 2021-01-01 DIAGNOSIS — Z23 Encounter for immunization: Secondary | ICD-10-CM | POA: Diagnosis not present

## 2021-01-01 DIAGNOSIS — Z7982 Long term (current) use of aspirin: Secondary | ICD-10-CM

## 2021-01-01 DIAGNOSIS — I110 Hypertensive heart disease with heart failure: Secondary | ICD-10-CM | POA: Diagnosis not present

## 2021-01-01 DIAGNOSIS — T380X5A Adverse effect of glucocorticoids and synthetic analogues, initial encounter: Secondary | ICD-10-CM | POA: Diagnosis present

## 2021-01-01 DIAGNOSIS — Z9842 Cataract extraction status, left eye: Secondary | ICD-10-CM

## 2021-01-01 DIAGNOSIS — I700 Atherosclerosis of aorta: Secondary | ICD-10-CM | POA: Diagnosis not present

## 2021-01-01 DIAGNOSIS — Z882 Allergy status to sulfonamides status: Secondary | ICD-10-CM

## 2021-01-01 DIAGNOSIS — G4700 Insomnia, unspecified: Secondary | ICD-10-CM | POA: Diagnosis present

## 2021-01-01 DIAGNOSIS — Z85828 Personal history of other malignant neoplasm of skin: Secondary | ICD-10-CM | POA: Diagnosis not present

## 2021-01-01 DIAGNOSIS — J470 Bronchiectasis with acute lower respiratory infection: Secondary | ICD-10-CM | POA: Diagnosis present

## 2021-01-01 DIAGNOSIS — J471 Bronchiectasis with (acute) exacerbation: Secondary | ICD-10-CM | POA: Diagnosis present

## 2021-01-01 DIAGNOSIS — R059 Cough, unspecified: Secondary | ICD-10-CM | POA: Diagnosis not present

## 2021-01-01 DIAGNOSIS — Z20822 Contact with and (suspected) exposure to covid-19: Secondary | ICD-10-CM | POA: Diagnosis present

## 2021-01-01 DIAGNOSIS — I160 Hypertensive urgency: Secondary | ICD-10-CM | POA: Diagnosis not present

## 2021-01-01 DIAGNOSIS — R0602 Shortness of breath: Secondary | ICD-10-CM | POA: Diagnosis not present

## 2021-01-01 DIAGNOSIS — Z885 Allergy status to narcotic agent status: Secondary | ICD-10-CM

## 2021-01-01 DIAGNOSIS — R531 Weakness: Secondary | ICD-10-CM | POA: Diagnosis not present

## 2021-01-01 DIAGNOSIS — Z7984 Long term (current) use of oral hypoglycemic drugs: Secondary | ICD-10-CM | POA: Diagnosis not present

## 2021-01-01 DIAGNOSIS — K219 Gastro-esophageal reflux disease without esophagitis: Secondary | ICD-10-CM | POA: Diagnosis present

## 2021-01-01 DIAGNOSIS — Z825 Family history of asthma and other chronic lower respiratory diseases: Secondary | ICD-10-CM

## 2021-01-01 DIAGNOSIS — Z66 Do not resuscitate: Secondary | ICD-10-CM | POA: Diagnosis present

## 2021-01-01 DIAGNOSIS — J189 Pneumonia, unspecified organism: Secondary | ICD-10-CM | POA: Insufficient documentation

## 2021-01-01 DIAGNOSIS — G9389 Other specified disorders of brain: Secondary | ICD-10-CM | POA: Diagnosis not present

## 2021-01-01 DIAGNOSIS — R9431 Abnormal electrocardiogram [ECG] [EKG]: Secondary | ICD-10-CM | POA: Diagnosis not present

## 2021-01-01 DIAGNOSIS — Z923 Personal history of irradiation: Secondary | ICD-10-CM | POA: Diagnosis not present

## 2021-01-01 DIAGNOSIS — J479 Bronchiectasis, uncomplicated: Secondary | ICD-10-CM | POA: Diagnosis not present

## 2021-01-01 DIAGNOSIS — R42 Dizziness and giddiness: Secondary | ICD-10-CM | POA: Diagnosis not present

## 2021-01-01 DIAGNOSIS — Z7951 Long term (current) use of inhaled steroids: Secondary | ICD-10-CM

## 2021-01-01 DIAGNOSIS — E440 Moderate protein-calorie malnutrition: Secondary | ICD-10-CM | POA: Insufficient documentation

## 2021-01-01 LAB — CBC WITH DIFFERENTIAL/PLATELET
Abs Immature Granulocytes: 0.05 10*3/uL (ref 0.00–0.07)
Basophils Absolute: 0.1 10*3/uL (ref 0.0–0.1)
Basophils Relative: 1 %
Eosinophils Absolute: 0.1 10*3/uL (ref 0.0–0.5)
Eosinophils Relative: 1 %
HCT: 31.1 % — ABNORMAL LOW (ref 36.0–46.0)
Hemoglobin: 10.1 g/dL — ABNORMAL LOW (ref 12.0–15.0)
Immature Granulocytes: 1 %
Lymphocytes Relative: 14 %
Lymphs Abs: 1.3 10*3/uL (ref 0.7–4.0)
MCH: 28.1 pg (ref 26.0–34.0)
MCHC: 32.5 g/dL (ref 30.0–36.0)
MCV: 86.6 fL (ref 80.0–100.0)
Monocytes Absolute: 1.2 10*3/uL — ABNORMAL HIGH (ref 0.1–1.0)
Monocytes Relative: 13 %
Neutro Abs: 6.8 10*3/uL (ref 1.7–7.7)
Neutrophils Relative %: 70 %
Platelets: 465 10*3/uL — ABNORMAL HIGH (ref 150–400)
RBC: 3.59 MIL/uL — ABNORMAL LOW (ref 3.87–5.11)
RDW: 12.8 % (ref 11.5–15.5)
WBC: 9.6 10*3/uL (ref 4.0–10.5)
nRBC: 0 % (ref 0.0–0.2)

## 2021-01-01 LAB — URINALYSIS, ROUTINE W REFLEX MICROSCOPIC
Bilirubin Urine: NEGATIVE
Glucose, UA: NEGATIVE mg/dL
Hgb urine dipstick: NEGATIVE
Ketones, ur: NEGATIVE mg/dL
Leukocytes,Ua: NEGATIVE
Nitrite: NEGATIVE
Specific Gravity, Urine: 1.006 (ref 1.005–1.030)
pH: 6.5 (ref 5.0–8.0)

## 2021-01-01 LAB — BASIC METABOLIC PANEL
Anion gap: 14 (ref 5–15)
BUN: 10 mg/dL (ref 8–23)
CO2: 24 mmol/L (ref 22–32)
Calcium: 8.7 mg/dL — ABNORMAL LOW (ref 8.9–10.3)
Chloride: 95 mmol/L — ABNORMAL LOW (ref 98–111)
Creatinine, Ser: 0.69 mg/dL (ref 0.44–1.00)
GFR, Estimated: >60 mL/min (ref >60)
Glucose, Bld: 182 mg/dL — ABNORMAL HIGH (ref 70–99)
Potassium: 4.1 mmol/L (ref 3.5–5.1)
Sodium: 133 mmol/L — ABNORMAL LOW (ref 135–145)

## 2021-01-01 LAB — RESP PANEL BY RT-PCR (FLU A&B, COVID) ARPGX2
Influenza A by PCR: NEGATIVE
Influenza B by PCR: NEGATIVE
SARS Coronavirus 2 by RT PCR: NEGATIVE

## 2021-01-01 MED ORDER — SODIUM CHLORIDE 0.9 % IV SOLN
500.0000 mg | Freq: Once | INTRAVENOUS | Status: AC
  Administered 2021-01-01: 500 mg via INTRAVENOUS
  Filled 2021-01-01: qty 500

## 2021-01-01 MED ORDER — ALBUTEROL SULFATE HFA 108 (90 BASE) MCG/ACT IN AERS: 2.0000 | INHALATION_SPRAY | RESPIRATORY_TRACT | Status: DC | PRN

## 2021-01-01 MED ORDER — ALBUTEROL SULFATE HFA 108 (90 BASE) MCG/ACT IN AERS: 6.0000 | INHALATION_SPRAY | Freq: Once | RESPIRATORY_TRACT | Status: DC

## 2021-01-01 MED ORDER — IPRATROPIUM BROMIDE 0.02 % IN SOLN
0.5000 mg | Freq: Once | RESPIRATORY_TRACT | Status: AC
  Administered 2021-01-01: 0.5 mg via RESPIRATORY_TRACT
  Filled 2021-01-01: qty 2.5

## 2021-01-01 MED ORDER — SODIUM CHLORIDE 0.9 % IV SOLN
1.0000 g | Freq: Once | INTRAVENOUS | Status: AC
  Administered 2021-01-01: 1 g via INTRAVENOUS
  Filled 2021-01-01: qty 10

## 2021-01-01 MED ORDER — PROMETHAZINE-CODEINE 6.25-10 MG/5ML PO SYRP: 5.0000 mL | ORAL_SOLUTION | Freq: Four times a day (QID) | ORAL | 0 refills | Status: DC | PRN

## 2021-01-01 MED ORDER — ALPRAZOLAM 0.5 MG PO TABS
0.2500 mg | ORAL_TABLET | Freq: Once | ORAL | Status: AC
  Administered 2021-01-01: 0.25 mg via ORAL
  Filled 2021-01-01: qty 1

## 2021-01-01 MED ORDER — ALBUTEROL (5 MG/ML) CONTINUOUS INHALATION SOLN
10.0000 mg/h | INHALATION_SOLUTION | Freq: Once | RESPIRATORY_TRACT | Status: AC
  Administered 2021-01-01: 10 mg/h via RESPIRATORY_TRACT
  Filled 2021-01-01: qty 20

## 2021-01-01 MED ORDER — SODIUM CHLORIDE 0.9 % IV BOLUS
500.0000 mL | Freq: Once | INTRAVENOUS | Status: AC
  Administered 2021-01-01: 500 mL via INTRAVENOUS

## 2021-01-01 MED ORDER — DEXAMETHASONE SODIUM PHOSPHATE 10 MG/ML IJ SOLN
10.0000 mg | Freq: Once | INTRAMUSCULAR | Status: AC
  Administered 2021-01-01: 10 mg via INTRAVENOUS
  Filled 2021-01-01: qty 1

## 2021-01-01 MED ORDER — IPRATROPIUM BROMIDE HFA 17 MCG/ACT IN AERS: 2.0000 | INHALATION_SPRAY | Freq: Once | RESPIRATORY_TRACT | Status: DC

## 2021-01-01 NOTE — Telephone Encounter (Signed)
Cough syrup script sent to her [facility]

## 2021-01-01 NOTE — ED Notes (Signed)
Patient going to xray at this time. Will obtain EKG when patient returns.

## 2021-01-01 NOTE — ED Notes (Signed)
Care Handoff/Patient Report given to [facility] RN at this Time. All Questions Answered.

## 2021-01-01 NOTE — Telephone Encounter (Signed)
Called and spoke with Patient. Patient stated she receive her prescription for Tessalon from [facility], NP, but was told she had no cough syrup.  Promethazine-codeine prescription was called  to pharmacy 12/28/20, by Dr. Annamaria Boots. Called [facility], spoke with Pharmacist. Pharmacist stated promethazine-codeine prescription was received, but [facility]. Called and updated Patient.  Patient requested promethazine-codeine prescription to be sent to [facility].   Message routed to Dr. Annamaria Boots   Allergies  Allergen Reactions   Roanna Banning Hcl] Hives   Amlodipine     Ankle swelling   Atorvastatin     Other reaction(s): foot pain   Coreg [Carvedilol]     dizzy   Doxycycline     rash   Glucosamine Other (See Comments)    Unknown   Mobic [Meloxicam]    Pravastatin Sodium     Other reaction(s): rash   Rosuvastatin Calcium     Other reaction(s): itching   Sulfa Antibiotics     itching   Telmisartan     itching   Tramadol Nausea Only   Celecoxib Hives, Itching and Rash   Statins Rash   Current Outpatient Medications on File Prior to Visit  Medication Sig Dispense Refill   acetaminophen (TYLENOL) 325 MG tablet Take 650 mg by mouth as needed.     albuterol (VENTOLIN HFA) 108 (90 Base) MCG/ACT inhaler Inhale into the lungs every 6 (six) hours as needed for wheezing or shortness of breath.     albuterol (VENTOLIN HFA) 108 (90 Base) MCG/ACT inhaler INHALE 2 PUFFS INTO THE LUNGS EVERY 6 HOURS AS NEEDED FOR WHEEZING OR SHORTNESS OF BREATH 8.5 each 3   ALPRAZolam (XANAX) 0.25 MG tablet Take 0.25 mg by mouth See admin instructions. 0.'25mg'$  at bedtime And an extra 0.'25mg'$  if upset during the day     aspirin EC 81 MG tablet Take 81 mg by mouth daily. Swallow whole.     benzonatate (TESSALON) 200 MG capsule Take 1 capsule (200 mg total) by mouth 3 (three) times daily as needed for cough. 30 capsule 1   budesonide (PULMICORT) 0.25 MG/2ML nebulizer  solution USE 1 VIAL  IN  NEBULIZER TWICE  DAILY - rinse mouth after treatment 60 mL 11   carboxymethylcellulose (REFRESH PLUS) 0.5 % SOLN 1 drop as needed.     cholecalciferol (VITAMIN D) 1000 UNITS tablet Take 2,000 Units by mouth daily.     Cinnamon 500 MG capsule Take 1,000 mg by mouth 2 (two) times daily as needed (for sugar levels).      esomeprazole (NEXIUM) 40 MG capsule Take 40 mg by mouth daily.     formoterol (PERFOROMIST) 20 MCG/2ML nebulizer solution Take 2 mLs (20 mcg total) by nebulization 2 (two) times daily. Dx: J47.9 120 mL 5   guaiFENesin-dextromethorphan (ROBITUSSIN DM) 100-10 MG/5ML syrup Take 5 mLs by mouth every 4 (four) hours as needed for cough. 118 mL 0   hydrochlorothiazide (MICROZIDE) 12.5 MG capsule Take 12.5 mg by mouth daily.     ipratropium (ATROVENT) 0.02 % nebulizer solution USE 1 VIAL IN NEBULIZER 4 TIMES DAILY 120 mL 11   irbesartan (AVAPRO) 300 MG tablet TAKE 1 TABLET BY MOUTH EVERY DAY 90 tablet 1   meclizine (ANTIVERT) 12.5 MG tablet Take 12.5 mg by mouth as needed for dizziness.     metFORMIN (GLUCOPHAGE) 500 MG tablet Take 1,000 mg by mouth 2 (two) times daily.     Multiple Vitamins-Minerals (PRESERVISION AREDS 2 PO) Take 1 tablet by  mouth in the morning and at bedtime.     ONE TOUCH ULTRA TEST test strip USE TO CHECK BLOOD SUGAR ONCE DAILY 90  5   ONETOUCH DELICA LANCETS 99991111 MISC USE TO CHECK BLOOD SUGAR ONCE DAILY AS DIRECTED  5   polyvinyl alcohol (LIQUIFILM TEARS) 1.4 % ophthalmic solution Place 1 drop into both eyes as needed for dry eyes.     promethazine-codeine (PHENERGAN WITH CODEINE) 6.25-10 MG/5ML syrup Take 5 mLs by mouth every 6 (six) hours as needed for cough. 180 mL 0   promethazine-codeine (PHENERGAN WITH CODEINE) 6.25-10 MG/5ML syrup Take 5 mLs by mouth every 6 (six) hours as needed for cough. 180 mL 0   Respiratory Therapy Supplies (FLUTTER) DEVI Use as directed 1 each 0   traMADol (ULTRAM) 50 MG tablet Take 0.5 tablets (25 mg total) by  mouth every 6 (six) hours as needed for moderate pain. 30 tablet 1   vitamin E 400 UNIT capsule Take 400 Units by mouth daily.      No current facility-administered medications on file prior to visit.

## 2021-01-01 NOTE — Telephone Encounter (Signed)
Called and spoke with Patient.  Patient aware Dr. Annamaria Boots sent promethazine-codeine to [facility].  Nothing further at this time.

## 2021-01-01 NOTE — ED Triage Notes (Signed)
Patient has been coughing really bad for 3 weeks and feeling per patient (hx of COPD).

## 2021-01-01 NOTE — ED Provider Notes (Signed)
[facility] EMERGENCY DEPT Provider Note   CSN: [id number] Arrival date & time: 01/01/21  1430     History Chief Complaint  Patient presents with   Cough   Weakness    Katie Alvarado is a 87 y.o. female.  HPI   Pt is a 87 y/o female with a h/o bronchiectasis, skin ca, copd, dm, dizziness, ht, who presents to the ED today for eval of generalized weakness that started a few weeks ago and has been worsening.  She also reports a chronic cough that seems to be worse and shortness of breath over the last few weeks. She was seen by her physician and was placed on antibiotics recently but states her symptoms have not improved. She finished abx yesterday.   Past Medical History:  Diagnosis Date   Acute nasopharyngitis (common cold)    Arthritis    Bronchiectasis with acute exacerbation (HCC)    Cancer (HCC)    skin cancer   COPD (chronic obstructive pulmonary disease) ([place])    sees dr. Annamaria Boots    Cough    Diabetes mellitus    fasting 130-150   Dizziness    Esophageal reflux    History of radiation therapy 10/24/17- 12/05/17   Lower lip, 4.4 Gy X 10 fractions given twice weekly for a total dose of 44 Gy.   Hypertension    Insomnia, unspecified    Other symptoms involving nervous and musculoskeletal systems(781.99)    Pneumonia    hx of   Shortness of breath    exertion    Patient Active Problem List   Diagnosis Date Noted   Anxiety 09/14/2020   Bronchiolectasis ([place]) 09/14/2020   Chronic anxiety 09/14/2020   Drug-induced myopathy 09/14/2020   Edema 09/14/2020   Gout 09/14/2020   History of pneumonia 09/14/2020   History of squamous cell carcinoma 09/14/2020   Hyperlipidemia 09/14/2020   Hypertensive heart disease without congestive heart failure 09/14/2020   Impairment of balance 09/14/2020   Iron deficiency anemia 09/14/2020   Legal blindness, as defined in [place] 09/14/2020   Lumbar radiculopathy 09/14/2020   Lumbar spondylosis 09/14/2020    Microalbuminuria 09/14/2020   Osteoarthritis of knee 09/14/2020   Pseudogout 09/14/2020   Solitary pulmonary nodule 09/14/2020   Vitamin D deficiency 09/14/2020   Pain in right hand 08/08/2020   EKG abnormality 12/24/2019   Hypertensive urgency 12/24/2019   Acute hyponatremia 12/23/2019   Neck pain 12/13/2019   AKI (acute kidney injury) ([place]) 01/10/2019   CAP (community acquired pneumonia) 01/07/2019   Thrombocytosis 12/25/2018   Degeneration of lumbar intervertebral disc 10/28/2018   Left-sided chest wall pain 06/01/2018   Back pain 05/05/2018   Low back pain radiating down leg 05/05/2018   Low back pain 05/05/2018   Pneumonia of left lower lobe due to infectious organism 04/28/2018   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   Allergic urticaria 03/05/2016   Gastritis 03/05/2016   Chronic rhinitis 01/09/2016   Pruritus 09/05/2015   Atypical mycobacterial infection of lung ([place])    Anemia 07/18/2015   Bronchiectasis without complication ([place]) 0000000   Renal artery stenosis ([place]) 04/25/2015   Essential hypertension 04/25/2015   Lower extremity pain, bilateral 08/04/2012   Diarrhea 08/03/2012   Klebsiella infection 08/01/2012   DM type 2 (diabetes mellitus, type 2) ([place]) 07/29/2012   Generalized weakness 07/29/2012   Weakness 04/11/2011   Hyponatremia 04/11/2011   UTI (urinary tract infection) 04/11/2011   ARF (acute renal failure) ([place]) 04/11/2011   Leucocytosis  04/11/2011   Dehydration 04/11/2011   Hypotension 04/11/2011   BRONCHIECTASIS, + MAIC 03/25/2010   DIZZINESS 02/02/2010   MUSCULOSKELETAL PAIN 09/25/2007   INSOMNIA 05/20/2007   ACUTE NASOPHARYNGITIS 04/20/2007   Bronchiectasis with acute exacerbation ([place]) 04/20/2007   Esophageal reflux 04/20/2007    Past Surgical History:  Procedure Laterality Date   ABDOMINAL HYSTERECTOMY     ANKLE FRACTURE SURGERY Left    APPENDECTOMY     BASAL CELL CARCINOMA EXCISION     NOSE   CATARACT EXTRACTION, BILATERAL      OUT PATIENT   COLON SURGERY     colon resection for diverticulitis   IR EPIDUROGRAPHY  05/07/2018   LUMBAR LAMINECTOMY/DECOMPRESSION MICRODISCECTOMY N/A 02/15/2013   Procedure: LUMBAR LAMINECTOMY/DECOMPRESSION MICRODISCECTOMY LUMBAR FOUR-FIVE;  Surgeon: Otilio Connors, MD;  Location: [facility];  Service: Neurosurgery;  Laterality: N/A;   VARICOSE VEIN SURGERY       OB History     Gravida  2   Para  2   Term      Preterm      AB      Living         SAB      IAB      Ectopic      Multiple      Live Births              Family History  Problem Relation Age of Onset   Chronic bronchitis Father    Other Sister        heart trouble   Other Brother        heart trouble    Social History   Tobacco Use   Smoking status: Never   Smokeless tobacco: Never  Vaping Use   Vaping Use: Never used  Substance Use Topics   Alcohol use: Yes    Comment: "Hardly Ever"    Drug use: No    Home Medications Prior to Admission medications   Medication Sig Start Date End Date Taking? Authorizing Provider  aspirin EC 81 MG tablet Take 81 mg by mouth daily. Swallow whole.   Yes [provider]  carvedilol (COREG) 6.25 MG tablet Take 6.25 mg by mouth 2 (two) times daily with a meal.   Yes [provider]  cholecalciferol (VITAMIN D) 1000 UNITS tablet Take 2,000 Units by mouth daily.   Yes [provider]  esomeprazole (NEXIUM) 40 MG capsule Take 40 mg by mouth daily.   Yes [provider]  furosemide (LASIX) 20 MG tablet Take 20 mg by mouth.   Yes [provider]  irbesartan (AVAPRO) 300 MG tablet TAKE 1 TABLET BY MOUTH EVERY DAY 10/18/20  Yes Jerline Pain, MD  metFORMIN (GLUCOPHAGE) 500 MG tablet Take 1,000 mg by mouth 2 (two) times daily.   Yes [provider]  promethazine-codeine (PHENERGAN WITH CODEINE) 6.25-10 MG/5ML syrup Take 5 mLs by mouth every 6 (six) hours as needed for cough. 12/28/20  Yes Young, Tarri Fuller D,  MD  vitamin E 400 UNIT capsule Take 400 Units by mouth daily.    Yes [provider]  acetaminophen (TYLENOL) 325 MG tablet Take 650 mg by mouth as needed.    [provider]  albuterol (VENTOLIN HFA) 108 (90 Base) MCG/ACT inhaler Inhale into the lungs every 6 (six) hours as needed for wheezing or shortness of breath.    [provider]  albuterol (VENTOLIN HFA) 108 (90 Base) MCG/ACT inhaler INHALE 2 PUFFS INTO THE  LUNGS EVERY 6 HOURS AS NEEDED FOR WHEEZING OR SHORTNESS OF BREATH 08/17/20   Deneise Lever, MD  ALPRAZolam Duanne Moron) 0.25 MG tablet Take 0.25 mg by mouth See admin instructions. 0.'25mg'$  at bedtime And an extra 0.'25mg'$  if upset during the day    [provider]  benzonatate (TESSALON) 200 MG capsule Take 1 capsule (200 mg total) by mouth 3 (three) times daily as needed for cough. 12/29/20   Parrett, Tammy S, NP  budesonide (PULMICORT) 0.25 MG/2ML nebulizer solution USE 1 VIAL  IN  NEBULIZER TWICE  DAILY - rinse mouth after treatment 06/30/20   Baird Lyons D, MD  carboxymethylcellulose (REFRESH PLUS) 0.5 % SOLN 1 drop as needed.    [provider]  Cinnamon 500 MG capsule Take 1,000 mg by mouth 2 (two) times daily as needed (for sugar levels).     [provider]  formoterol (PERFOROMIST) 20 MCG/2ML nebulizer solution Take 2 mLs (20 mcg total) by nebulization 2 (two) times daily. Dx: J47.9 01/19/19   Parrett, Fonnie Mu, NP  guaiFENesin-dextromethorphan (ROBITUSSIN DM) 100-10 MG/5ML syrup Take 5 mLs by mouth every 4 (four) hours as needed for cough. 01/10/19   Dhungel, Nishant, MD  ipratropium (ATROVENT) 0.02 % nebulizer solution USE 1 VIAL IN NEBULIZER 4 TIMES DAILY 06/30/20   Baird Lyons D, MD  meclizine (ANTIVERT) 12.5 MG tablet Take 12.5 mg by mouth as needed for dizziness.    [provider]  Multiple Vitamins-Minerals (PRESERVISION AREDS 2 PO) Take 1 tablet by mouth in the morning and at bedtime.    [provider]  ONE  TOUCH ULTRA TEST test strip USE TO CHECK BLOOD SUGAR ONCE DAILY 90 01/04/16   [provider]  [facility] DELICA LANCETS [id number] MISC USE TO CHECK BLOOD SUGAR ONCE DAILY AS DIRECTED 01/04/16   [provider]  polyvinyl alcohol (LIQUIFILM TEARS) 1.4 % ophthalmic solution Place 1 drop into both eyes as needed for dry eyes.    [provider]  Respiratory Therapy Supplies (FLUTTER) DEVI Use as directed 11/03/15   Deneise Lever, MD  traMADol (ULTRAM) 50 MG tablet Take 0.5 tablets (25 mg total) by mouth every 6 (six) hours as needed for moderate pain. 05/08/18   Georgette Shell, MD    Allergies    Welchol Melvyn Neth hcl], Amlodipine, Atorvastatin, Coreg [carvedilol], Doxycycline, Glucosamine, Mobic [meloxicam], Pravastatin sodium, Rosuvastatin calcium, Sulfa antibiotics, Telmisartan, Tramadol, Celecoxib, and Statins  Review of Systems   Review of Systems  Constitutional:  Negative for chills and fever.  HENT:  Negative for ear pain and sore throat.   Eyes:  Negative for pain and visual disturbance.  Respiratory:  Positive for cough and shortness of breath.   Cardiovascular:  Negative for chest pain.  Gastrointestinal:  Negative for abdominal pain, constipation, diarrhea, nausea and vomiting.  Genitourinary:  Negative for dysuria and hematuria.  Musculoskeletal:  Negative for back pain.  Skin:  Negative for color change and rash.  Neurological:  Positive for weakness. Negative for headaches.  All other systems reviewed and are negative.  Physical Exam Updated Vital Signs BP (!) 169/81   Pulse 97   Temp 97.9 F (36.6 C) (Oral)   Resp (!) 26   Ht '5\' 8"'$  (1.727 m)   Wt 54.9 kg   SpO2 95%   BMI 18.40 kg/m   Physical Exam Vitals and nursing note reviewed.  Constitutional:      General: She is not in acute distress.    Appearance: She  is well-developed.  HENT:     Head: Normocephalic and atraumatic.  Eyes:     Conjunctiva/sclera: Conjunctivae normal.   Cardiovascular:     Rate and Rhythm: Normal rate and regular rhythm.     Heart sounds: Normal heart sounds. No murmur heard. Pulmonary:     Effort: Pulmonary effort is normal. No respiratory distress.     Breath sounds: Wheezing and rales present.  Abdominal:     General: Bowel sounds are normal.     Palpations: Abdomen is soft.     Tenderness: There is no abdominal tenderness. There is no guarding or rebound.  Musculoskeletal:     Cervical back: Neck supple.     Comments: Trace ble edema (improved per patient)  Skin:    General: Skin is warm and dry.  Neurological:     Mental Status: She is alert.    ED Results / Procedures / Treatments   Labs (all labs ordered are listed, but only abnormal results are displayed) Labs Reviewed  CBC WITH DIFFERENTIAL/PLATELET - Abnormal; Notable for the following components:      Result Value   RBC 3.59 (*)    Hemoglobin 10.1 (*)    HCT 31.1 (*)    Platelets 465 (*)    Monocytes Absolute 1.2 (*)    All other components within normal limits  BASIC METABOLIC PANEL - Abnormal; Notable for the following components:   Sodium 133 (*)    Chloride 95 (*)    Glucose, Bld 182 (*)    Calcium 8.7 (*)    All other components within normal limits  URINALYSIS, ROUTINE W REFLEX MICROSCOPIC - Abnormal; Notable for the following components:   Color, Urine COLORLESS (*)    Protein, ur TRACE (*)    All other components within normal limits  RESP PANEL BY RT-PCR (FLU A&B, COVID) ARPGX2  CULTURE, BLOOD (ROUTINE X 2)  CULTURE, BLOOD (ROUTINE X 2)    EKG EKG Interpretation  Date/Time:  Monday January 01 2021 16:01:39 EDT Ventricular Rate:  95 PR Interval:  168 QRS Duration: 72 QT Interval:  342 QTC Calculation: 429 R Axis:   18 Text Interpretation: Normal sinus rhythm Septal infarct , age undetermined Abnormal ECG Since last tracing T wave inversion has resolved Confirmed by Calvert Cantor [phone]) on 01/01/2021 4:23:55 PM  Radiology DG Chest 2  View  Result Date: 01/01/2021 CLINICAL DATA:  Weakness, shortness of breath, history of COPD EXAM: CHEST - 2 VIEW COMPARISON:  Chest radiograph 12/27/2020 FINDINGS: The cardiomediastinal silhouette is stable. There is dense calcified atherosclerotic plaque throughout the aorta. There are patchy interstitial markings throughout both lungs. Focal opacity projecting over the right upper lobe is unchanged since the most recent prior study but increased in conspicuity since 12/23/2019 there are increased opacities in the right lower lobe since the prior study. There is blunting of the left costophrenic angle, unchanged and likely chronic. The right costophrenic angle is normal. There is no pneumothorax. There is no acute osseous abnormality. IMPRESSION: 1. Patchy opacities in the right lower lobe are increased in conspicuity since the prior study and may reflect developing infection in the correct clinical setting. 2. Nodular appearing opacity projecting over the right upper lobe is not significantly changed compared to the most recent study but increased since 2021. Recommend nonemergent outpatient CT chest to evaluate for parenchymal nodule. 3. Additional patchy opacities throughout both lungs are overall stable and likely reflect chronic changes. Electronically Signed   By: Court Joy.D.  On: 01/01/2021 16:11    Procedures .Critical Care Performed by: Rodney Booze, PA-C Authorized by: Rodney Booze, PA-C   Critical care provider statement:    Critical care time (minutes):  37   Critical care time was exclusive of:  Separately billable procedures and treating other patients   Critical care was necessary to treat or prevent imminent or life-threatening deterioration of the following conditions:  Respiratory failure   Critical care was time spent personally by me on the following activities:  Development of treatment plan with patient or surrogate, discussions with consultants, discussions with  primary provider, evaluation of patient's response to treatment, examination of patient, ordering and performing treatments and interventions, ordering and review of laboratory studies, ordering and review of radiographic studies, pulse oximetry, re-evaluation of patient's condition, review of old charts and obtaining history from patient or surrogate   I assumed direction of critical care for this patient from another provider in my specialty: no     Care discussed with: admitting provider     Medications Ordered in ED Medications  cefTRIAXone (ROCEPHIN) 1 g in sodium chloride 0.9 % 100 mL IVPB (1 g Intravenous New Bag/Given 01/01/21 2010)  azithromycin (ZITHROMAX) 500 mg in sodium chloride 0.9 % 250 mL IVPB (has no administration in time range)  dexamethasone (DECADRON) injection 10 mg (10 mg Intravenous Given 01/01/21 1834)  sodium chloride 0.9 % bolus 500 mL (0 mLs Intravenous Stopped 01/01/21 1911)  albuterol (PROVENTIL,VENTOLIN) solution continuous neb (10 mg/hr Nebulization Given 01/01/21 1813)  ipratropium (ATROVENT) nebulizer solution 0.5 mg (0.5 mg Nebulization Given 01/01/21 1813)  ALPRAZolam (XANAX) tablet 0.25 mg (0.25 mg Oral Given 01/01/21 1924)    ED Course  I have reviewed the triage vital signs and the nursing notes.  Pertinent labs & imaging results that were available during my care of the patient were reviewed by me and considered in my medical decision making (see chart for details).    MDM Rules/Calculators/A&P                          87 y/o F presenting for eval of cough, sob and weakness for several weeks  Reviewed/interpreted labs CBC with mild anemia which appears chronic, no leukocytosis BMP with mild hyponatremia and hypochloremia, otherwise reassuring  COVID negative Blood cultures obtained  EKG - Normal sinus rhythm Septal infarct , age undetermined Abnormal ECG Since last tracing T wave inversion has resolved   CXR - 1. Patchy opacities in the right lower  lobe are increased in conspicuity since the prior study and may reflect developing infection in the correct clinical setting. 2. Nodular appearing opacity projecting over the right upper lobe is not significantly changed compared to the most recent study but increased since 2021. Recommend nonemergent outpatient CT chest to evaluate for parenchymal nodule. 3. Additional patchy opacities throughout both lungs are overall stable and likely reflect chronic changes.  Pt given ivf, steroids, continuous neb. On reassessment, patient still states she feels very weak.  She is minimally improved after continuous nebulizer treatment.  She is still tachypneic with sats at about 94% on room air.  Feel she will require admission for further management especially in the setting of recently completing a course of antibiotics yesterday with no improvement of symptoms.  She be given dose of antibiotics here and will call for admission.  7:50 PM CONSULT with Dr. Tonie Griffith who accepts patient for admission at [facility] Impression(s) /  ED Diagnoses Final diagnoses:  Community acquired pneumonia, unspecified laterality    Rx / DC Orders ED Discharge Orders     None        Bishop Dublin 01/01/21 2018    Truddie Hidden, MD 01/01/21 2154

## 2021-01-02 ENCOUNTER — Encounter (HOSPITAL_COMMUNITY): Payer: Self-pay | Admitting: Family Medicine

## 2021-01-02 ENCOUNTER — Inpatient Hospital Stay (HOSPITAL_COMMUNITY): Payer: Medicare Other

## 2021-01-02 DIAGNOSIS — I5032 Chronic diastolic (congestive) heart failure: Secondary | ICD-10-CM

## 2021-01-02 DIAGNOSIS — I160 Hypertensive urgency: Secondary | ICD-10-CM

## 2021-01-02 DIAGNOSIS — K219 Gastro-esophageal reflux disease without esophagitis: Secondary | ICD-10-CM

## 2021-01-02 DIAGNOSIS — J441 Chronic obstructive pulmonary disease with (acute) exacerbation: Secondary | ICD-10-CM | POA: Diagnosis present

## 2021-01-02 DIAGNOSIS — E1165 Type 2 diabetes mellitus with hyperglycemia: Secondary | ICD-10-CM

## 2021-01-02 DIAGNOSIS — J189 Pneumonia, unspecified organism: Secondary | ICD-10-CM

## 2021-01-02 DIAGNOSIS — J471 Bronchiectasis with (acute) exacerbation: Secondary | ICD-10-CM

## 2021-01-02 LAB — COMPREHENSIVE METABOLIC PANEL
ALT: 10 U/L (ref 0–44)
AST: 12 U/L — ABNORMAL LOW (ref 15–41)
Albumin: 3.2 g/dL — ABNORMAL LOW (ref 3.5–5.0)
Alkaline Phosphatase: 61 U/L (ref 38–126)
Anion gap: 13 (ref 5–15)
BUN: 10 mg/dL (ref 8–23)
CO2: 25 mmol/L (ref 22–32)
Calcium: 8.9 mg/dL (ref 8.9–10.3)
Chloride: 98 mmol/L (ref 98–111)
Creatinine, Ser: 0.49 mg/dL (ref 0.44–1.00)
GFR, Estimated: >60 mL/min (ref >60)
Glucose, Bld: 262 mg/dL — ABNORMAL HIGH (ref 70–99)
Potassium: 3.7 mmol/L (ref 3.5–5.1)
Sodium: 136 mmol/L (ref 135–145)
Total Bilirubin: 0.4 mg/dL (ref 0.3–1.2)
Total Protein: 6.5 g/dL (ref 6.5–8.1)

## 2021-01-02 LAB — CBC WITH DIFFERENTIAL/PLATELET
Abs Immature Granulocytes: 0.02 10*3/uL (ref 0.00–0.07)
Basophils Absolute: 0 10*3/uL (ref 0.0–0.1)
Basophils Relative: 0 %
Eosinophils Absolute: 0 10*3/uL (ref 0.0–0.5)
Eosinophils Relative: 0 %
HCT: 29.9 % — ABNORMAL LOW (ref 36.0–46.0)
Hemoglobin: 10 g/dL — ABNORMAL LOW (ref 12.0–15.0)
Immature Granulocytes: 0 %
Lymphocytes Relative: 7 %
Lymphs Abs: 0.5 10*3/uL — ABNORMAL LOW (ref 0.7–4.0)
MCH: 28.5 pg (ref 26.0–34.0)
MCHC: 33.4 g/dL (ref 30.0–36.0)
MCV: 85.2 fL (ref 80.0–100.0)
Monocytes Absolute: 0.1 10*3/uL (ref 0.1–1.0)
Monocytes Relative: 2 %
Neutro Abs: 5.9 10*3/uL (ref 1.7–7.7)
Neutrophils Relative %: 91 %
Platelets: 395 10*3/uL (ref 150–400)
RBC: 3.51 MIL/uL — ABNORMAL LOW (ref 3.87–5.11)
RDW: 12.7 % (ref 11.5–15.5)
WBC: 6.5 10*3/uL (ref 4.0–10.5)
nRBC: 0 % (ref 0.0–0.2)

## 2021-01-02 LAB — HEMOGLOBIN A1C
Hgb A1c MFr Bld: 6.6 % — ABNORMAL HIGH (ref 4.8–5.6)
Mean Plasma Glucose: 142.72 mg/dL

## 2021-01-02 LAB — EXPECTORATED SPUTUM ASSESSMENT W GRAM STAIN, RFLX TO RESP C

## 2021-01-02 LAB — GLUCOSE, CAPILLARY
Glucose-Capillary: 275 mg/dL — ABNORMAL HIGH (ref 70–99)
Glucose-Capillary: 202 mg/dL — ABNORMAL HIGH (ref 70–99)
Glucose-Capillary: 90 mg/dL (ref 70–99)

## 2021-01-02 LAB — BRAIN NATRIURETIC PEPTIDE: B Natriuretic Peptide: 233.1 pg/mL — ABNORMAL HIGH (ref 0.0–100.0)

## 2021-01-02 LAB — MAGNESIUM: Magnesium: 1.1 mg/dL — ABNORMAL LOW (ref 1.7–2.4)

## 2021-01-02 MED ORDER — MAGNESIUM SULFATE 4 GM/100ML IV SOLN
4.0000 g | Freq: Once | INTRAVENOUS | Status: AC
  Administered 2021-01-02: 4 g via INTRAVENOUS
  Filled 2021-01-02: qty 100

## 2021-01-02 MED ORDER — POLYVINYL ALCOHOL 1.4 % OP SOLN
1.0000 [drp] | OPHTHALMIC | Status: DC | PRN
  Filled 2021-01-02: qty 15

## 2021-01-02 MED ORDER — CARVEDILOL 6.25 MG PO TABS
6.2500 mg | ORAL_TABLET | Freq: Two times a day (BID) | ORAL | Status: DC
  Administered 2021-01-02 – 2021-01-03 (×4): 6.25 mg via ORAL
  Filled 2021-01-02 (×4): qty 1

## 2021-01-02 MED ORDER — BUDESONIDE 0.25 MG/2ML IN SUSP
0.2500 mg | Freq: Two times a day (BID) | RESPIRATORY_TRACT | Status: DC
  Administered 2021-01-02 – 2021-01-05 (×7): 0.25 mg via RESPIRATORY_TRACT
  Filled 2021-01-02 (×8): qty 2

## 2021-01-02 MED ORDER — IRBESARTAN 300 MG PO TABS
300.0000 mg | ORAL_TABLET | Freq: Every day | ORAL | Status: DC
  Administered 2021-01-02 – 2021-01-05 (×4): 300 mg via ORAL
  Filled 2021-01-02 (×4): qty 1

## 2021-01-02 MED ORDER — IPRATROPIUM-ALBUTEROL 0.5-2.5 (3) MG/3ML IN SOLN
3.0000 mL | Freq: Three times a day (TID) | RESPIRATORY_TRACT | Status: DC
  Administered 2021-01-02 – 2021-01-05 (×9): 3 mL via RESPIRATORY_TRACT
  Filled 2021-01-02 (×10): qty 3

## 2021-01-02 MED ORDER — METHYLPREDNISOLONE SODIUM SUCC 40 MG IJ SOLR
40.0000 mg | INTRAMUSCULAR | Status: DC
  Administered 2021-01-03 – 2021-01-04 (×2): 40 mg via INTRAVENOUS
  Filled 2021-01-02 (×2): qty 1

## 2021-01-02 MED ORDER — IPRATROPIUM-ALBUTEROL 0.5-2.5 (3) MG/3ML IN SOLN
3.0000 mL | RESPIRATORY_TRACT | Status: DC | PRN
  Administered 2021-01-03: 3 mL via RESPIRATORY_TRACT
  Filled 2021-01-02: qty 3

## 2021-01-02 MED ORDER — ONDANSETRON HCL 4 MG PO TABS: 4.0000 mg | ORAL_TABLET | Freq: Four times a day (QID) | ORAL | Status: DC | PRN

## 2021-01-02 MED ORDER — ASPIRIN EC 81 MG PO TBEC
81.0000 mg | DELAYED_RELEASE_TABLET | Freq: Every day | ORAL | Status: DC
  Administered 2021-01-02 – 2021-01-05 (×4): 81 mg via ORAL
  Filled 2021-01-02 (×4): qty 1

## 2021-01-02 MED ORDER — SODIUM CHLORIDE 0.9 % IV SOLN: 500.0000 mg | INTRAVENOUS | Status: DC

## 2021-01-02 MED ORDER — ENOXAPARIN SODIUM 40 MG/0.4ML IJ SOSY
40.0000 mg | PREFILLED_SYRINGE | INTRAMUSCULAR | Status: DC
  Administered 2021-01-02 – 2021-01-05 (×4): 40 mg via SUBCUTANEOUS
  Filled 2021-01-02 (×4): qty 0.4

## 2021-01-02 MED ORDER — PANTOPRAZOLE SODIUM 40 MG PO TBEC
40.0000 mg | DELAYED_RELEASE_TABLET | Freq: Every day | ORAL | Status: DC
  Administered 2021-01-02 – 2021-01-05 (×4): 40 mg via ORAL
  Filled 2021-01-02 (×4): qty 1

## 2021-01-02 MED ORDER — METHYLPREDNISOLONE SODIUM SUCC 40 MG IJ SOLR
40.0000 mg | Freq: Two times a day (BID) | INTRAMUSCULAR | Status: DC
  Administered 2021-01-02: 40 mg via INTRAVENOUS
  Filled 2021-01-02: qty 1

## 2021-01-02 MED ORDER — INSULIN ASPART 100 UNIT/ML IJ SOLN
0.0000 [IU] | Freq: Three times a day (TID) | INTRAMUSCULAR | Status: DC
  Administered 2021-01-02: 8 [IU] via SUBCUTANEOUS
  Administered 2021-01-02 – 2021-01-03 (×2): 3 [IU] via SUBCUTANEOUS
  Administered 2021-01-03: 11 [IU] via SUBCUTANEOUS
  Administered 2021-01-03: 2 [IU] via SUBCUTANEOUS
  Administered 2021-01-03: 3 [IU] via SUBCUTANEOUS
  Administered 2021-01-04: 5 [IU] via SUBCUTANEOUS
  Administered 2021-01-04: 3 [IU] via SUBCUTANEOUS
  Administered 2021-01-04: 11 [IU] via SUBCUTANEOUS
  Administered 2021-01-04: 3 [IU] via SUBCUTANEOUS
  Administered 2021-01-05: 5 [IU] via SUBCUTANEOUS
  Administered 2021-01-05: 2 [IU] via SUBCUTANEOUS

## 2021-01-02 MED ORDER — ACETAMINOPHEN 650 MG RE SUPP: 650.0000 mg | Freq: Four times a day (QID) | RECTAL | Status: DC | PRN

## 2021-01-02 MED ORDER — HYDRALAZINE HCL 20 MG/ML IJ SOLN
10.0000 mg | Freq: Four times a day (QID) | INTRAMUSCULAR | Status: DC | PRN
  Administered 2021-01-02: 10 mg via INTRAVENOUS
  Filled 2021-01-02: qty 1

## 2021-01-02 MED ORDER — SODIUM CHLORIDE 0.9 % IV SOLN: 2.0000 g | INTRAVENOUS | Status: DC

## 2021-01-02 MED ORDER — INFLUENZA VAC A&B SA ADJ QUAD 0.5 ML IM PRSY
0.5000 mL | PREFILLED_SYRINGE | INTRAMUSCULAR | Status: AC
  Administered 2021-01-04: 0.5 mL via INTRAMUSCULAR
  Filled 2021-01-02: qty 0.5

## 2021-01-02 MED ORDER — ALPRAZOLAM 0.25 MG PO TABS
0.2500 mg | ORAL_TABLET | Freq: Once | ORAL | Status: AC
  Administered 2021-01-02: 0.25 mg via ORAL
  Filled 2021-01-02: qty 1

## 2021-01-02 MED ORDER — LACTATED RINGERS IV SOLN: INTRAVENOUS | Status: AC

## 2021-01-02 MED ORDER — BENZONATATE 100 MG PO CAPS
200.0000 mg | ORAL_CAPSULE | Freq: Three times a day (TID) | ORAL | Status: DC | PRN
  Administered 2021-01-02 – 2021-01-04 (×6): 200 mg via ORAL
  Filled 2021-01-02 (×6): qty 2

## 2021-01-02 MED ORDER — PIPERACILLIN-TAZOBACTAM 3.375 G IVPB
3.3750 g | Freq: Three times a day (TID) | INTRAVENOUS | Status: DC
  Administered 2021-01-02 – 2021-01-05 (×10): 3.375 g via INTRAVENOUS
  Filled 2021-01-02 (×10): qty 50

## 2021-01-02 MED ORDER — ONDANSETRON HCL 4 MG/2ML IJ SOLN: 4.0000 mg | Freq: Four times a day (QID) | INTRAMUSCULAR | Status: DC | PRN

## 2021-01-02 MED ORDER — GUAIFENESIN-DM 100-10 MG/5ML PO SYRP
5.0000 mL | ORAL_SOLUTION | ORAL | Status: DC | PRN
  Administered 2021-01-03: 5 mL via ORAL
  Filled 2021-01-02: qty 10

## 2021-01-02 MED ORDER — ACETAMINOPHEN 325 MG PO TABS
650.0000 mg | ORAL_TABLET | Freq: Four times a day (QID) | ORAL | Status: DC | PRN
  Administered 2021-01-02 – 2021-01-04 (×5): 650 mg via ORAL
  Filled 2021-01-02 (×5): qty 2

## 2021-01-02 MED ORDER — IOHEXOL 350 MG/ML SOLN
60.0000 mL | Freq: Once | INTRAVENOUS | Status: AC | PRN
  Administered 2021-01-02: 60 mL via INTRAVENOUS

## 2021-01-02 MED ORDER — IPRATROPIUM-ALBUTEROL 0.5-2.5 (3) MG/3ML IN SOLN: 3.0000 mL | RESPIRATORY_TRACT | Status: DC | PRN

## 2021-01-02 MED ORDER — IBUPROFEN 200 MG PO TABS
200.0000 mg | ORAL_TABLET | Freq: Once | ORAL | Status: AC
  Administered 2021-01-02: 200 mg via ORAL
  Filled 2021-01-02: qty 1

## 2021-01-02 MED ORDER — HYDRALAZINE HCL 25 MG PO TABS: 25.0000 mg | ORAL_TABLET | Freq: Three times a day (TID) | ORAL | Status: DC

## 2021-01-02 MED ORDER — IPRATROPIUM-ALBUTEROL 0.5-2.5 (3) MG/3ML IN SOLN
3.0000 mL | Freq: Four times a day (QID) | RESPIRATORY_TRACT | Status: DC
  Administered 2021-01-02 (×3): 3 mL via RESPIRATORY_TRACT
  Filled 2021-01-02 (×3): qty 3

## 2021-01-02 MED ORDER — HYDRALAZINE HCL 25 MG PO TABS
25.0000 mg | ORAL_TABLET | Freq: Three times a day (TID) | ORAL | Status: DC
  Administered 2021-01-02 – 2021-01-03 (×4): 25 mg via ORAL
  Filled 2021-01-02 (×4): qty 1

## 2021-01-02 MED ORDER — LORATADINE 10 MG PO TABS
10.0000 mg | ORAL_TABLET | Freq: Every day | ORAL | Status: DC
  Administered 2021-01-02 – 2021-01-05 (×4): 10 mg via ORAL
  Filled 2021-01-02 (×4): qty 1

## 2021-01-02 MED ORDER — PIPERACILLIN-TAZOBACTAM 3.375 G IVPB 30 MIN
3.3750 g | Freq: Once | INTRAVENOUS | Status: AC
  Administered 2021-01-02: 3.375 g via INTRAVENOUS
  Filled 2021-01-02: qty 50

## 2021-01-02 MED ORDER — PROMETHAZINE-CODEINE 6.25-10 MG/5ML PO SYRP
5.0000 mL | ORAL_SOLUTION | Freq: Four times a day (QID) | ORAL | Status: DC | PRN
  Administered 2021-01-02: 5 mL via ORAL
  Filled 2021-01-02: qty 5

## 2021-01-02 MED ORDER — POLYETHYLENE GLYCOL 3350 17 G PO PACK: 17.0000 g | PACK | Freq: Every day | ORAL | Status: DC | PRN

## 2021-01-02 NOTE — Progress Notes (Signed)
Pharmacy Antibiotic Note  Katie Alvarado is a 87 y.o. female admitted on 01/01/2021 with  pneumonia with bronchiectasis . Pt was recently prescribed azithromycin outpatient with no improvement.  Pharmacy has been consulted for piperacillin/tazobactam dosing.  Today, 01/02/21 WBC WNL SCr 0.49, CrCl ~ 40 mL/min Afebrile  Plan: Piperacillin/tazobactam 3.375 g IV q8h EI  Renal function WNL, pharmacy to sign off. Please re-consult if needed.  Height: '5\' 8"'$  (172.7 cm) Weight: 57.8 kg (127 lb 6.8 oz) IBW/kg (Calculated) : 63.9  Temp (24hrs), Avg:98 F (36.7 C), Min:97.7 F (36.5 C), Max:98.4 F (36.9 C)  Recent Labs  Lab 01/01/21 1538 01/02/21 0437  WBC 9.6 6.5  CREATININE 0.69 0.49    Estimated Creatinine Clearance: 40.1 mL/min (by C-G formula based on SCr of 0.49 mg/dL).    Allergies  Allergen Reactions   Welchol [Colesevelam Hcl] Hives   Amlodipine     Ankle swelling   Atorvastatin     Other reaction(s): foot pain   Coreg [Carvedilol]     dizzy   Doxycycline     rash   Glucosamine Other (See Comments)    Unknown   Mobic [Meloxicam]    Sulfa Antibiotics     itching   Telmisartan     itching   Tramadol Nausea Only   Celecoxib Hives, Itching and Rash   Statins Rash    Antimicrobials this admission: Piperacillin/tazobactam 9/13 >>  Ceftriaxone + azithromycin 9/12 x1 dose  Dose adjustments this admission:  Microbiology results: 9/12 BCx: ngtd  Thank you for allowing pharmacy to be a part of this patient's care.  Lenis Noon, PharmD 01/02/2021 9:30 AM

## 2021-01-02 NOTE — H&P (Signed)
History and Physical    Katie Alvarado [id number] DOB: [birth year] DOA: 01/01/2021  PCP: [name], MD  Patient coming from: Home   Chief Complaint:  Chief Complaint  Patient presents with   Cough   Weakness     HPI:   87 year old female with past medical history of COPD, hypertension, bronchiectasis (follows with Dr. Annamaria Boots), non-insulin-dependent diabetes mellitus type 2, gastroesophageal reflux disease, diastolic congestive heart failure (Echo 12/2019 EF 60-65% with G1DD), macular degeneration and legal blindness who presents to [facility] as a transfer from [facility] due to concerns for pneumonia.  Patient explains that for approximately the past 4 weeks she has been experiencing shortness of breath.  Initially shortness of breath was mild in intensity but in the weeks that followed became more more severe.  Shortness of breath became associated with an exacerbation of patient's chronic cough which has become more frequent with increased sputum production compared to her baseline.  As patient's symptoms have progressed, she has developed progressively worsening generalized weakness and poor appetite with increasing dyspnea on exertion.  Patient symptoms continued to worsen and the patient contacted her pulmonologist on 9/8 via phone call with these complaints and was prescribed a supply of Mucinex DM and Tessalon Perles.  Patient explains that despite taking this new medication she continued to experience progressively worsening symptoms.  Patient's symptoms became so severe that she eventually presented to [place] emergency department for evaluation.  Upon evaluation in the emergency department, chest x-ray revealed developing patchy right lower lobe infiltrate concerning for pneumonia.  Patient was initiated on intravenous ceftriaxone and azithromycin.  Possible group was then called excepted the patient for transfer to [facility] long hospital  for continued medical care.  Review of Systems:   Review of Systems  Respiratory:  Positive for cough, sputum production, shortness of breath and wheezing.   All other systems reviewed and are negative.  Past Medical History:  Diagnosis Date   Acute nasopharyngitis (common cold)    Arthritis    Bronchiectasis with acute exacerbation (HCC)    Cancer (HCC)    skin cancer   COPD (chronic obstructive pulmonary disease) ([place])    sees dr. Annamaria Boots    Cough    Diabetes mellitus    fasting 130-150   Dizziness    Esophageal reflux    History of radiation therapy 10/24/17- 12/05/17   Lower lip, 4.4 Gy X 10 fractions given twice weekly for a total dose of 44 Gy.   Hypertension    Insomnia, unspecified    Other symptoms involving nervous and musculoskeletal systems(781.99)    Pneumonia    hx of   Shortness of breath    exertion    Past Surgical History:  Procedure Laterality Date   ABDOMINAL HYSTERECTOMY     ANKLE FRACTURE SURGERY Left    APPENDECTOMY     BASAL CELL CARCINOMA EXCISION     NOSE   CATARACT EXTRACTION, BILATERAL     OUT PATIENT   COLON SURGERY     colon resection for diverticulitis   IR EPIDUROGRAPHY  05/07/2018   LUMBAR LAMINECTOMY/DECOMPRESSION MICRODISCECTOMY N/A 02/15/2013   Procedure: LUMBAR LAMINECTOMY/DECOMPRESSION MICRODISCECTOMY LUMBAR FOUR-FIVE;  Surgeon: Otilio Connors, MD;  Location: [facility];  Service: Neurosurgery;  Laterality: N/A;   VARICOSE VEIN SURGERY       reports that she has never smoked. She has never used smokeless tobacco. She reports current alcohol use. She reports that she does not  use drugs.  Allergies  Allergen Reactions   Welchol [Colesevelam Hcl] Hives   Amlodipine     Ankle swelling   Atorvastatin     Other reaction(s): foot pain   Coreg [Carvedilol]     dizzy   Doxycycline     rash   Glucosamine Other (See Comments)    Unknown   Mobic [Meloxicam]    Sulfa Antibiotics     itching   Telmisartan     itching   Tramadol  Nausea Only   Celecoxib Hives, Itching and Rash   Statins Rash    Family History  Problem Relation Age of Onset   Chronic bronchitis Father    Other Sister        heart trouble   Other Brother        heart trouble     Prior to Admission medications   Medication Sig Start Date End Date Taking? Authorizing Provider  aspirin EC 81 MG tablet Take 81 mg by mouth daily. Swallow whole.   Yes [provider]  carvedilol (COREG) 6.25 MG tablet Take 6.25 mg by mouth 2 (two) times daily with a meal.   Yes [provider]  cholecalciferol (VITAMIN D) 1000 UNITS tablet Take 2,000 Units by mouth daily.   Yes [provider]  esomeprazole (NEXIUM) 40 MG capsule Take 40 mg by mouth daily.   Yes [provider]  furosemide (LASIX) 20 MG tablet Take 20 mg by mouth.   Yes [provider]  irbesartan (AVAPRO) 300 MG tablet TAKE 1 TABLET BY MOUTH EVERY DAY 10/18/20  Yes Jerline Pain, MD  metFORMIN (GLUCOPHAGE) 500 MG tablet Take 1,000 mg by mouth 2 (two) times daily.   Yes [provider]  promethazine-codeine (PHENERGAN WITH CODEINE) 6.25-10 MG/5ML syrup Take 5 mLs by mouth every 6 (six) hours as needed for cough. 12/28/20  Yes Young, Tarri Fuller D, MD  vitamin E 400 UNIT capsule Take 400 Units by mouth daily.    Yes [provider]  acetaminophen (TYLENOL) 325 MG tablet Take 650 mg by mouth as needed.    [provider]  albuterol (VENTOLIN HFA) 108 (90 Base) MCG/ACT inhaler Inhale into the lungs every 6 (six) hours as needed for wheezing or shortness of breath.    [provider]  albuterol (VENTOLIN HFA) 108 (90 Base) MCG/ACT inhaler INHALE 2 PUFFS INTO THE LUNGS EVERY 6 HOURS AS NEEDED FOR WHEEZING OR SHORTNESS OF BREATH 08/17/20   Young, Kasandra Knudsen, MD  ALPRAZolam Duanne Moron) 0.25 MG tablet Take 0.25 mg by mouth See admin instructions. 0.'25mg'$  at bedtime And an extra 0.'25mg'$  if upset during the day    [provider]   benzonatate (TESSALON) 200 MG capsule Take 1 capsule (200 mg total) by mouth 3 (three) times daily as needed for cough. 12/29/20   Parrett, Tammy S, NP  budesonide (PULMICORT) 0.25 MG/2ML nebulizer solution USE 1 VIAL  IN  NEBULIZER TWICE  DAILY - rinse mouth after treatment 06/30/20   Baird Lyons D, MD  carboxymethylcellulose (REFRESH PLUS) 0.5 % SOLN 1 drop as needed.    [provider]  Cinnamon 500 MG capsule Take 1,000 mg by mouth 2 (two) times daily as needed (for sugar levels).     [provider]  formoterol (PERFOROMIST) 20 MCG/2ML nebulizer solution Take 2 mLs (20 mcg total) by nebulization 2 (two) times daily. Dx: J47.9 01/19/19   Parrett, Fonnie Mu, NP  guaiFENesin-dextromethorphan (ROBITUSSIN DM) 100-10 MG/5ML syrup Take  5 mLs by mouth every 4 (four) hours as needed for cough. 01/10/19   Dhungel, Nishant, MD  ipratropium (ATROVENT) 0.02 % nebulizer solution USE 1 VIAL IN NEBULIZER 4 TIMES DAILY 06/30/20   Baird Lyons D, MD  meclizine (ANTIVERT) 12.5 MG tablet Take 12.5 mg by mouth as needed for dizziness.    [provider]  Multiple Vitamins-Minerals (PRESERVISION AREDS 2 PO) Take 1 tablet by mouth in the morning and at bedtime.    [provider]  ONE TOUCH ULTRA TEST test strip USE TO CHECK BLOOD SUGAR ONCE DAILY 90 01/04/16   [provider]  [facility] DELICA LANCETS [id number] MISC USE TO CHECK BLOOD SUGAR ONCE DAILY AS DIRECTED 01/04/16   [provider]  polyvinyl alcohol (LIQUIFILM TEARS) 1.4 % ophthalmic solution Place 1 drop into both eyes as needed for dry eyes.    [provider]  Respiratory Therapy Supplies (FLUTTER) DEVI Use as directed 11/03/15   Deneise Lever, MD  traMADol (ULTRAM) 50 MG tablet Take 0.5 tablets (25 mg total) by mouth every 6 (six) hours as needed for moderate pain. 05/08/18   Georgette Shell, MD    Physical Exam: Vitals:   01/02/21 0357 01/02/21 0519 01/02/21 0828 01/02/21 0846  BP: (!) 157/71    (!) 183/99  Pulse: 95   (!) 102  Resp: 20     Temp: 97.7 F (36.5 C)     TempSrc: Oral     SpO2: 92%  93% 97%  Weight:  57.8 kg    Height:  '5\' 8"'$  (1.727 m)      Constitutional: Awake alert and oriented x3, patient is in mild distress due to shortness of breath and cough.  Patient is somewhat cachectic. Skin: no rashes, no lesions, poor skin turgor noted. Eyes: Pupils are equally reactive to light.  No evidence of scleral icterus or conjunctival pallor.  ENMT: Dry mucous membranes noted.  Posterior pharynx clear of any exudate or lesions.   Neck: normal, supple, no masses, no thyromegaly.  No evidence of jugular venous distension.   Respiratory: Notable rales in the bilateral mid and lower field with scattered rhonchi bilaterally.  Faint intermittent expiratory wheezing noted in the bilateral lower fields as well.  Increased respiratory effort noted without accessory muscle use.  Cardiovascular: Regular rate and rhythm, no murmurs / rubs / gallops. No extremity edema. 2+ pedal pulses. No carotid bruits.  Chest:   Nontender without crepitus or deformity.   Back:   Nontender without crepitus or deformity. Abdomen: Abdomen is soft and nontender.  No evidence of intra-abdominal masses.  Positive bowel sounds noted in all quadrants.   Musculoskeletal: Poor muscle tone noted.  No joint deformity upper and lower extremities. Good ROM, no contractures. Neurologic: CN 2-12 grossly intact. Sensation intact.  Patient moving all 4 extremities spontaneously.  Patient is following all commands.  Patient is responsive to verbal stimuli.   Psychiatric: Patient exhibits normal mood with appropriate affect.  Patient seems to possess insight as to their current situation.     Labs on Admission: I have personally reviewed following labs and imaging studies -   CBC: Recent Labs  Lab 01/01/21 1538 01/02/21 0437  WBC 9.6 6.5  NEUTROABS 6.8 5.9  HGB 10.1* 10.0*  HCT 31.1* 29.9*  MCV 86.6 85.2  PLT  465* [id number]   Basic Metabolic Panel: Recent Labs  Lab 01/01/21 1538 01/02/21 0437  NA 133* 136  K 4.1 3.7  CL 95* 98  CO2 24 25  GLUCOSE 182* 262*  BUN 10 10  CREATININE 0.69 0.49  CALCIUM 8.7* 8.9  MG  --  1.1*   GFR: Estimated Creatinine Clearance: 40.1 mL/min (by C-G formula based on SCr of 0.49 mg/dL). Liver Function Tests: Recent Labs  Lab 01/02/21 0437  AST 12*  ALT 10  ALKPHOS 61  BILITOT 0.4  PROT 6.5  ALBUMIN 3.2*   No results for input(s): LIPASE, AMYLASE in the last 168 hours. No results for input(s): AMMONIA in the last 168 hours. Coagulation Profile: No results for input(s): INR, PROTIME in the last 168 hours. Cardiac Enzymes: No results for input(s): CKTOTAL, CKMB, CKMBINDEX, TROPONINI in the last 168 hours. BNP (last 3 results) No results for input(s): PROBNP in the last 8760 hours. HbA1C: No results for input(s): HGBA1C in the last 72 hours. CBG: No results for input(s): GLUCAP in the last 168 hours. Lipid Profile: No results for input(s): CHOL, HDL, LDLCALC, TRIG, CHOLHDL, LDLDIRECT in the last 72 hours. Thyroid Function Tests: No results for input(s): TSH, T4TOTAL, FREET4, T3FREE, THYROIDAB in the last 72 hours. Anemia Panel: No results for input(s): VITAMINB12, FOLATE, FERRITIN, TIBC, IRON, RETICCTPCT in the last 72 hours. Urine analysis:    Component Value Date/Time   COLORURINE COLORLESS (A) 01/01/2021 2011   APPEARANCEUR CLEAR 01/01/2021 2011   LABSPEC 1.006 01/01/2021 2011   PHURINE 6.5 01/01/2021 2011   GLUCOSEU NEGATIVE 01/01/2021 2011   HGBUR NEGATIVE 01/01/2021 2011   [place] NEGATIVE 01/01/2021 2011   KETONESUR NEGATIVE 01/01/2021 2011   PROTEINUR TRACE (A) 01/01/2021 2011   UROBILINOGEN 0.2 12/19/2018 1155   NITRITE NEGATIVE 01/01/2021 2011   LEUKOCYTESUR NEGATIVE 01/01/2021 2011    Radiological Exams on Admission - Personally Reviewed: DG Chest 2 View  Result Date: 01/01/2021 CLINICAL DATA:  Weakness, shortness of  breath, history of COPD EXAM: CHEST - 2 VIEW COMPARISON:  Chest radiograph 12/27/2020 FINDINGS: The cardiomediastinal silhouette is stable. There is dense calcified atherosclerotic plaque throughout the aorta. There are patchy interstitial markings throughout both lungs. Focal opacity projecting over the right upper lobe is unchanged since the most recent prior study but increased in conspicuity since 12/23/2019 there are increased opacities in the right lower lobe since the prior study. There is blunting of the left costophrenic angle, unchanged and likely chronic. The right costophrenic angle is normal. There is no pneumothorax. There is no acute osseous abnormality. IMPRESSION: 1. Patchy opacities in the right lower lobe are increased in conspicuity since the prior study and may reflect developing infection in the correct clinical setting. 2. Nodular appearing opacity projecting over the right upper lobe is not significantly changed compared to the most recent study but increased since 2021. Recommend nonemergent outpatient CT chest to evaluate for parenchymal nodule. 3. Additional patchy opacities throughout both lungs are overall stable and likely reflect chronic changes. Electronically Signed   By: Valetta Mole M.D.   On: 01/01/2021 16:11   CT CHEST W CONTRAST  Result Date: 01/02/2021 CLINICAL DATA:  Interstitial infiltrates. History of bronchiectasis with cough EXAM: CT CHEST WITH CONTRAST TECHNIQUE: Multidetector CT imaging of the chest was performed during intravenous contrast administration. CONTRAST:  26m OMNIPAQUE IOHEXOL 350 MG/ML SOLN COMPARISON:  01/08/2019 FINDINGS: Cardiovascular: Normal heart size. No pericardial effusion. Extensive atheromatous calcification of coronaries in the aorta. Mediastinum/Nodes: Negative for adenopathy or mass. Lungs/Pleura: Patchy bronchiectasis, primarily cystic and varicose. Areas of greatest involvement in the posterior segment right upper lobe, superior segment  right lower lobe, diffusely  in the left lower lobe, and at the lingula. Severity is greatest at the lingula where there is collapse and cystic involvement, chronic and stable. Bronchiectasis has progressed especially in the superior segment right lower lobe and at the left base. Generalized airway thickening with multiple bronchiectatic air-fluid levels and increased reticulonodular opacity especially at the lingula and in the right lower lobe. Air trapping seen at the left lower lobe. No lobar consolidation. Upper Abdomen: Negative Musculoskeletal: Posterior right twelfth rib fracture which is corticated appearing and likely chronic. Minimally covered right eleventh rib fracture which is age indeterminate IMPRESSION: 1. Increased reticulonodular opacities compatible with bronchitis exacerbation/bronchopneumonia. 2. Background of bronchiectasis which has progressed from 2020. 3. Nonacute appearing right twelfth rib fracture. Partially covered and age-indeterminate right eleventh rib fracture. 4.  Aortic Atherosclerosis (ICD10-I70.0). Electronically Signed   By: Jorje Guild M.D.   On: 01/02/2021 08:08    EKG: Personally reviewed.  Rhythm is normal sinus rhythm with heart rate of 95 bpm.  QTC of 429.  No dynamic ST segment changes appreciated.  Assessment/Plan Principal Problem:   Pneumonia of both lungs due to infectious organism complicated by acute exacerbation of bronchiectasis  Patient presenting with nearly 4-week history of progressively worsening shortness of breath wheezing dyspnea on exertion and dramatic increase in cough beyond her baseline with increased sputum production CT imaging reveals patchy bilateral multifocal pneumonia with concurrent progression of bronchiectasis Advancement of bronchiectasis.  About concern for involvement of Pseudomonas Keeping this in mind we will switch patient from ceftriaxone and azithromycin started in the emergency department intravenous Zosyn.  This can  then likely be transitioned to oral levofloxacin at time of discharge Aggressive bronchodilator therapy Concurrently initiating systemic steroids due to concerns for possible concurrent COPD exacerbation Aggressive chest physiotherapy and flutter valve use Supplemental oxygen as necessary for bouts of hypoxia  Active Problems:   Hypertensive urgency  Resuming home regimen of oral antihypertensive therapy Home medication list reconciled by pharmacy including amlodipine which surprisingly is also on patient's allergy list.  This will need to be verified  in the morning and will be held for now. As needed intravenous and hypertensives for markedly elevated blood pressure.      GERD without esophagitis  Daily PPI per home regimen    Type 2 diabetes mellitus with hyperglycemia, without long-term current use of insulin (HCC)  Holding home regimen of metformin Hemoglobin A1c pending Accu-Cheks before every meal and nightly with sliding scale insulin Will need to accommodate for hyperglycemia expected with initiation of systemic steroid    Chronic diastolic CHF (congestive heart failure) (HCC)  No evidence of cardiogenic volume overload   Code Status:  DNR  code status decision has been confirmed with: patient Family Communication: deferred   Status is: Inpatient  Remains inpatient appropriate because:Ongoing diagnostic testing needed not appropriate for outpatient work up, IV treatments appropriate due to intensity of illness or inability to take PO, and Inpatient level of care appropriate due to severity of illness  Dispo: The patient is from: Home              Anticipated d/c is to: Home              Patient currently is not medically stable to d/c.   Difficult to place patient No        Vernelle Emerald MD [facility] Pager [phone]  If 7PM-7AM, please contact night-coverage [email or web address] Use universal [place] password for that web site. If you  do not  have the password, please call the hospital operator.  01/02/2021, 9:29 AM

## 2021-01-02 NOTE — Evaluation (Signed)
Physical Therapy Evaluation Patient Details Name: Katie Alvarado MRN: [id number] DOB: 17-Feb-1926 Today's Date: 01/02/2021  History of Present Illness  87 yo female admitted with Pna, weakness. Hx of COPD, bronchiectasis, DM, GERD, mac deg, dizziness  Clinical Impression  On eval, pt required Min-Mod A for mobility. She walked ~10 feet around the room. Pt presents with general weakness, decreased activity tolerance, and impaired gait and balance. Pt is at risk for falls when mobilizing. Discussed d/c plan with pt, with family and close friend present. Pt prefers to return home with family/neighbor/friend assisting as able. Recommend HHPT and 24 hour supervision/assist.      Recommendations for follow up therapy are one component of a multi-disciplinary discharge planning process, led by the attending physician.  Recommendations may be updated based on patient status, additional functional criteria and insurance authorization.  Follow Up Recommendations Home health PT;Supervision/Assistance - 24 hour    Equipment Recommendations  None recommended by PT    Recommendations for Other Services       Precautions / Restrictions Precautions Precautions: Fall Restrictions Weight Bearing Restrictions: No      Mobility  Bed Mobility Overal bed mobility: Needs Assistance Bed Mobility: Supine to Sit     Supine to sit: Min guard;HOB elevated     General bed mobility comments: Min guard for safety. Increased time and effort for pt.    Transfers Overall transfer level: Needs assistance Equipment used: Rolling walker (2 wheeled) Transfers: Sit to/from Stand Sit to Stand: From elevated surface Mod Assist         General transfer comment: x2. Assist to power up, steady, control descent. Cues for safety, hand placement. Increased time and effort for pt.  Ambulation/Gait Ambulation/Gait assistance: Min assist Gait Distance (Feet): 10 Feet Assistive device: Rolling walker (2  wheeled) Gait Pattern/deviations: Step-through pattern;Decreased stride length     General Gait Details: MIn A to steady. Dyspnea 2/4. Unsteady.  Stairs            Wheelchair Mobility    Modified Rankin (Stroke Patients Only)       Balance Overall balance assessment: Needs assistance;History of Falls         Standing balance support: Bilateral upper extremity supported Standing balance-Leahy Scale: Poor                               Pertinent Vitals/Pain Pain Assessment: No/denies pain    Home Living Family/patient expects to be discharged to:: Private residence Living Arrangements: Alone Available Help at Discharge: Friend(s);Neighbor;Available PRN/intermittently Type of Home: House Home Access: Ramped entrance     Home Layout: One level Home Equipment: [place] - 4 wheels;Bedside commode;Shower seat - built in;Cane - single point;Grab bars - tub/shower      Prior Function Level of Independence: Needs assistance   Gait / Transfers Assistance Needed: uses rollator. sometimes "furniture walks"  ADL's / Homemaking Assistance Needed: does not drive; assist with groceries, meals, transporation        Hand Dominance        Extremity/Trunk Assessment   Upper Extremity Assessment Upper Extremity Assessment: Generalized weakness    Lower Extremity Assessment Lower Extremity Assessment: Generalized weakness    Cervical / Trunk Assessment Cervical / Trunk Assessment: Kyphotic  Communication   Communication: No difficulties  Cognition Arousal/Alertness: Awake/alert Behavior During Therapy: WFL for tasks assessed/performed Overall Cognitive Status: Within Functional Limits for tasks assessed  General Comments      Exercises     Assessment/Plan    PT Assessment Patient needs continued PT services  PT Problem List Decreased strength;Decreased mobility;Decreased activity  tolerance;Decreased balance;Decreased knowledge of use of DME       PT Treatment Interventions DME instruction;Gait training;Therapeutic exercise;Balance training;Functional mobility training;Therapeutic activities;Patient/family education    PT Goals (Current goals can be found in the Care Plan section)  Acute Rehab PT Goals Patient Stated Goal: to go home PT Goal Formulation: With patient Time For Goal Achievement: 01/16/21 Potential to Achieve Goals: Good    Frequency Min 3X/week   Barriers to discharge Decreased caregiver support has intermittent help.    Co-evaluation               AM-PAC PT "6 Clicks" Mobility  Outcome Measure Help needed turning from your back to your side while in a flat bed without using bedrails?: A Little Help needed moving from lying on your back to sitting on the side of a flat bed without using bedrails?: A Little Help needed moving to and from a bed to a chair (including a wheelchair)?: A Little Help needed standing up from a chair using your arms (e.g., wheelchair or bedside chair)?: A Little Help needed to walk in hospital room?: A Little Help needed climbing 3-5 steps with a railing? : A Lot 6 Click Score: 17    End of Session Equipment Utilized During Treatment: Gait belt Activity Tolerance: Patient tolerated treatment well Patient left: in chair;with call bell/phone within reach;with family/visitor present   PT Visit Diagnosis: Muscle weakness (generalized) (M62.81);Difficulty in walking, not elsewhere classified (R26.2)    Time: 1660-6301 PT Time Calculation (min) (ACUTE ONLY): 39 min   Charges:   PT Evaluation $PT Eval Moderate Complexity: 1 Mod PT Treatments $Gait Training: 8-22 mins $Therapeutic Activity: 8-22 mins          Doreatha Massed, PT Acute Rehabilitation  Office: [phone] Pager: [phone]

## 2021-01-02 NOTE — Progress Notes (Signed)
Pt seen and admitted for by Dr Cyd Silence earlier this am.  Please his detailed H&P note.   87 year old female with past medical history of COPD, hypertension, bronchiectasis (follows with Dr. Annamaria Boots), non-insulin-dependent diabetes mellitus type 2, gastroesophageal reflux disease, diastolic congestive heart failure (Echo 12/2019 EF 60-65% with G1DD), macular degeneration and legal blindness who presents to [facility] hospital as a transfer from [facility] due to concerns for pneumonia.  chest x-ray revealed developing patchy right lower lobe infiltrate concerning for pneumonia.  Patient was initiated on intravenous ceftriaxone and azithromycin.   She was started on IV rocephin and Zithromax,  transitioned to IV zosyn, IV solumedrol, duo nebs.   On Exam   General exam: Appears calm and comfortable  Respiratory system: scattered rhonchi, on RA.  Cardiovascular system: S1 & S2 heard, RRR. No JVD,  No pedal edema. Gastrointestinal system: Abdomen is nondistended, soft and nontender. Normal bowel sounds heard. Central nervous system: Alert and oriented. No focal neurological deficits. Extremities: no pedal edema.  Skin: No rashes,  Psychiatry: Mood & affect appropriate.    Hosie Poisson, MD

## 2021-01-03 ENCOUNTER — Inpatient Hospital Stay (HOSPITAL_COMMUNITY): Payer: Medicare Other

## 2021-01-03 ENCOUNTER — Ambulatory Visit: Payer: Medicare Other | Admitting: Primary Care

## 2021-01-03 DIAGNOSIS — E440 Moderate protein-calorie malnutrition: Secondary | ICD-10-CM | POA: Insufficient documentation

## 2021-01-03 LAB — CBC WITH DIFFERENTIAL/PLATELET
Abs Immature Granulocytes: 0.06 10*3/uL (ref 0.00–0.07)
Basophils Absolute: 0 10*3/uL (ref 0.0–0.1)
Basophils Relative: 1 %
Eosinophils Absolute: 0 10*3/uL (ref 0.0–0.5)
Eosinophils Relative: 0 %
HCT: 31.8 % — ABNORMAL LOW (ref 36.0–46.0)
Hemoglobin: 10.4 g/dL — ABNORMAL LOW (ref 12.0–15.0)
Immature Granulocytes: 1 %
Lymphocytes Relative: 13 %
Lymphs Abs: 1 10*3/uL (ref 0.7–4.0)
MCH: 28.3 pg (ref 26.0–34.0)
MCHC: 32.7 g/dL (ref 30.0–36.0)
MCV: 86.4 fL (ref 80.0–100.0)
Monocytes Absolute: 0.6 10*3/uL (ref 0.1–1.0)
Monocytes Relative: 7 %
Neutro Abs: 6.4 10*3/uL (ref 1.7–7.7)
Neutrophils Relative %: 78 %
Platelets: 428 10*3/uL — ABNORMAL HIGH (ref 150–400)
RBC: 3.68 MIL/uL — ABNORMAL LOW (ref 3.87–5.11)
RDW: 12.8 % (ref 11.5–15.5)
WBC: 8.1 10*3/uL (ref 4.0–10.5)
nRBC: 0 % (ref 0.0–0.2)

## 2021-01-03 LAB — COMPREHENSIVE METABOLIC PANEL
ALT: 10 U/L (ref 0–44)
AST: 14 U/L — ABNORMAL LOW (ref 15–41)
Albumin: 3.2 g/dL — ABNORMAL LOW (ref 3.5–5.0)
Alkaline Phosphatase: 56 U/L (ref 38–126)
Anion gap: 11 (ref 5–15)
BUN: 12 mg/dL (ref 8–23)
CO2: 29 mmol/L (ref 22–32)
Calcium: 9.5 mg/dL (ref 8.9–10.3)
Chloride: 99 mmol/L (ref 98–111)
Creatinine, Ser: 0.7 mg/dL (ref 0.44–1.00)
GFR, Estimated: >60 mL/min (ref >60)
Glucose, Bld: 177 mg/dL — ABNORMAL HIGH (ref 70–99)
Potassium: 3.5 mmol/L (ref 3.5–5.1)
Sodium: 139 mmol/L (ref 135–145)
Total Bilirubin: 0.8 mg/dL (ref 0.3–1.2)
Total Protein: 6.4 g/dL — ABNORMAL LOW (ref 6.5–8.1)

## 2021-01-03 LAB — GLUCOSE, CAPILLARY
Glucose-Capillary: 306 mg/dL — ABNORMAL HIGH (ref 70–99)
Glucose-Capillary: 178 mg/dL — ABNORMAL HIGH (ref 70–99)
Glucose-Capillary: 168 mg/dL — ABNORMAL HIGH (ref 70–99)
Glucose-Capillary: 148 mg/dL — ABNORMAL HIGH (ref 70–99)

## 2021-01-03 LAB — PHOSPHORUS: Phosphorus: 3 mg/dL (ref 2.5–4.6)

## 2021-01-03 LAB — MAGNESIUM: Magnesium: 1.9 mg/dL (ref 1.7–2.4)

## 2021-01-03 MED ORDER — HYDRALAZINE HCL 20 MG/ML IJ SOLN
10.0000 mg | Freq: Four times a day (QID) | INTRAMUSCULAR | Status: DC | PRN
  Administered 2021-01-03 – 2021-01-05 (×3): 10 mg via INTRAVENOUS
  Filled 2021-01-03 (×3): qty 1

## 2021-01-03 MED ORDER — ALPRAZOLAM 0.25 MG PO TABS: 0.2500 mg | ORAL_TABLET | Freq: Every evening | ORAL | Status: DC | PRN

## 2021-01-03 MED ORDER — ADULT MULTIVITAMIN W/MINERALS CH
1.0000 | ORAL_TABLET | Freq: Every day | ORAL | Status: DC
  Administered 2021-01-03 – 2021-01-05 (×3): 1 via ORAL
  Filled 2021-01-03 (×2): qty 1

## 2021-01-03 MED ORDER — ALPRAZOLAM 0.25 MG PO TABS
0.2500 mg | ORAL_TABLET | Freq: Two times a day (BID) | ORAL | Status: DC | PRN
  Administered 2021-01-03 – 2021-01-05 (×4): 0.25 mg via ORAL
  Filled 2021-01-03 (×4): qty 1

## 2021-01-03 MED ORDER — CARVEDILOL 12.5 MG PO TABS
12.5000 mg | ORAL_TABLET | Freq: Two times a day (BID) | ORAL | Status: DC
  Administered 2021-01-04 – 2021-01-05 (×3): 12.5 mg via ORAL
  Filled 2021-01-03 (×3): qty 1

## 2021-01-03 MED ORDER — HYDRALAZINE HCL 50 MG PO TABS
50.0000 mg | ORAL_TABLET | Freq: Three times a day (TID) | ORAL | Status: DC
  Administered 2021-01-03 – 2021-01-05 (×6): 50 mg via ORAL
  Filled 2021-01-03 (×7): qty 1

## 2021-01-03 MED ORDER — GUAIFENESIN-CODEINE 100-10 MG/5ML PO SOLN
2.5000 mL | Freq: Four times a day (QID) | ORAL | Status: DC | PRN
  Administered 2021-01-03 – 2021-01-05 (×2): 2.5 mL via ORAL
  Filled 2021-01-03 (×2): qty 5

## 2021-01-03 NOTE — Progress Notes (Signed)
Pt BP still elevated and pt c/o increased vision. RN gave prn BP and scheduled and BP is still high. Pt says head feels full and ears feel numb. Pt's pupils a PERLA. RN notified MD. CT ordered

## 2021-01-03 NOTE — Progress Notes (Signed)
Spoke with the patient about trying the vest today for cpt. She says she is comfortable with doing flutter and doesn't want the vest at this time. She does do the flutter well with [place] and independently.

## 2021-01-03 NOTE — Progress Notes (Signed)
PROGRESS NOTE  TALECIA GREUNKE [id number] DOB: 06-07-1925 DOA: 01/01/2021 PCP: [name], MD   LOS: 2 days   Brief Narrative / Interim history: 87 year old female with history of COPD, HTN, bronchiectasis following with Dr. Annamaria Boots with pulmonology as an outpatient, NIDDM, chronic diastolic CHF who comes into the hospital with cough and progressive shortness of breath.  She has been having increased cough and more shortness of breath over the last couple of weeks, symptoms have gotten worse and eventually decided to come to the hospital.  Chest x-ray showed patchy right lower lobe infiltrate concerning for pneumonia, she was placed on antibiotics and admitted.  She was also wheezing on admission was placed on steroids  Subjective / 24h Interval events: She is still having coughing spells, unable to get any sputum up.  Still feeling shortness of breath but better.  Assessment & Plan: Principal Problem Multifocal pneumonia, COPD exacerbation-CT scan of the chest with infiltrate/bronchopneumonia in the posterior segment right upper lobe, superior segment right lower lobe, left lower lobe diffusely in the lingula. -Patient was placed on Zosyn, continue. -She was also placed on steroids along with bronchodilators for COPD exacerbation, continue.  Wheezing has improved some today -Continue supportive care, antitussives.  Thankfully, she is on room air   Active Problems: Hypertensive urgency -continue home medications, still hypertensive today, continue IV as needed's.  Anticipate uncontrolled blood pressure in the setting of #1  GERD-continue PPI  DM2 -with hyperglycemia, A1c 6.6 but currently with elevated CBGs in the setting of steroid use.  Continue sliding scale  CBG (last 3)  Recent Labs    01/02/21 2139 01/03/21 0858 01/03/21 1127  GLUCAP 90 306* 178*   Chronic diastolic CHF (congestive heart failure) (HCC) -Euvolemic   Scheduled Meds:  aspirin EC  81 mg Oral Daily    budesonide  0.25 mg Nebulization BID   carvedilol  6.25 mg Oral BID WC   enoxaparin (LOVENOX) injection  40 mg Subcutaneous Q24H   hydrALAZINE  25 mg Oral Q8H   influenza vaccine adjuvanted  0.5 mL Intramuscular Tomorrow-1000   insulin aspart  0-15 Units Subcutaneous TID AC & HS   ipratropium-albuterol  3 mL Nebulization TID   irbesartan  300 mg Oral Daily   loratadine  10 mg Oral Daily   methylPREDNISolone (SOLU-MEDROL) injection  40 mg Intravenous Q24H   pantoprazole  40 mg Oral Daily   Continuous Infusions:  piperacillin-tazobactam (ZOSYN)  IV 3.375 g (01/03/21 0510)   PRN Meds:.acetaminophen **OR** acetaminophen, ALPRAZolam, benzonatate, guaiFENesin-codeine, hydrALAZINE, ipratropium-albuterol, ondansetron **OR** ondansetron (ZOFRAN) IV, polyethylene glycol, polyvinyl alcohol  Diet Orders (From admission, onward)     Start     Ordered   01/02/21 0215  Diet heart healthy/carb modified Room service appropriate? Yes; Fluid consistency: Thin  Diet effective now       Question Answer Comment  Diet-HS Snack? Nothing   Room service appropriate? Yes   Fluid consistency: Thin      01/02/21 0217            DVT prophylaxis: enoxaparin (LOVENOX) injection 40 mg Start: 01/02/21 1000     Code Status: DNR  Family Communication: Will call daughter in the afternoon  Status is: Inpatient  Remains inpatient appropriate because:Inpatient level of care appropriate due to severity of illness  Dispo: The patient is from: Home              Anticipated d/c is to: Home  Patient currently is not medically stable to d/c.   Difficult to place patient No   Level of care: Telemetry  Consultants:  None   Procedures:  None   Microbiology  Blood cultures 9/12-no growth to date Respiratory culture-GNR, GPC, G PR  Antimicrobials: Zosyn 9/12 >>   Objective: Vitals:   01/02/21 2137 01/03/21 0518 01/03/21 0802 01/03/21 0856  BP: (!) 179/84 (!) 179/89  (!) 165/84  Pulse:  80 79    Resp: 14     Temp: (!) 97.4 F (36.3 C) (!) 97.4 F (36.3 C)    TempSrc: Oral Oral    SpO2: 96% 98% 98%   Weight:      Height:        Intake/Output Summary (Last 24 hours) at 01/03/2021 1129 Last data filed at 01/03/2021 1000 Gross per 24 hour  Intake 840 ml  Output 2175 ml  Net -1335 ml   Filed Weights   01/01/21 1526 01/02/21 0519  Weight: 54.9 kg 57.8 kg    Examination:  Constitutional: NAD Eyes: no scleral icterus ENMT: Mucous membranes are moist.  Neck: normal, supple Respiratory: clear to auscultation bilaterally, no wheezing, no crackles. Normal respiratory effort. No accessory muscle use.  Cardiovascular: Regular rate and rhythm, no murmurs / rubs / gallops. No LE edema. Abdomen: non distended, no tenderness. Bowel sounds positive.  Musculoskeletal: no clubbing / cyanosis.  Skin: no rashes Neurologic: non focal   Data Reviewed: I have independently reviewed following labs and imaging studies   CBC: Recent Labs  Lab 01/01/21 1538 01/02/21 0437 01/03/21 0453  WBC 9.6 6.5 8.1  NEUTROABS 6.8 5.9 6.4  HGB 10.1* 10.0* 10.4*  HCT 31.1* 29.9* 31.8*  MCV 86.6 85.2 86.4  PLT 465* 395 123456*   Basic Metabolic Panel: Recent Labs  Lab 01/01/21 1538 01/02/21 0437 01/03/21 0453  NA 133* 136 139  K 4.1 3.7 3.5  CL 95* 98 99  CO2 '24 25 29  '$ GLUCOSE 182* 262* 177*  BUN '10 10 12  '$ CREATININE 0.69 0.49 0.70  CALCIUM 8.7* 8.9 9.5  MG  --  1.1* 1.9  PHOS  --   --  3.0   Liver Function Tests: Recent Labs  Lab 01/02/21 0437 01/03/21 0453  AST 12* 14*  ALT 10 10  ALKPHOS 61 56  BILITOT 0.4 0.8  PROT 6.5 6.4*  ALBUMIN 3.2* 3.2*   Coagulation Profile: No results for input(s): INR, PROTIME in the last 168 hours. HbA1C: Recent Labs    01/02/21 0437  HGBA1C 6.6*   CBG: Recent Labs  Lab 01/02/21 1206 01/02/21 1804 01/02/21 2139 01/03/21 0858  GLUCAP 275* 202* 90 306*    Recent Results (from the past 240 hour(s))  Resp Panel by RT-PCR (Flu  A&B, Covid) Nasopharyngeal Swab     Status: None   Collection Time: 01/01/21  3:38 PM   Specimen: Nasopharyngeal Swab; Nasopharyngeal(NP) swabs in vial transport medium  Result Value Ref Range Status   SARS Coronavirus 2 by RT PCR NEGATIVE NEGATIVE Final    Comment: (NOTE) SARS-CoV-2 target nucleic acids are NOT DETECTED.  The SARS-CoV-2 RNA is generally detectable in upper respiratory specimens during the acute phase of infection. The lowest concentration of SARS-CoV-2 viral copies this assay can detect is 138 copies/mL. A negative result does not preclude SARS-Cov-2 infection and should not be used as the sole basis for treatment or other patient management decisions. A negative result may occur with  improper specimen collection/handling, submission of specimen other  than nasopharyngeal swab, presence of viral mutation(s) within the areas targeted by this assay, and inadequate number of viral copies(<138 copies/mL). A negative result must be combined with clinical observations, patient history, and epidemiological information. The expected result is Negative.  Fact Sheet for Patients:  EntrepreneurPulse.com.au  Fact Sheet for Healthcare Providers:  IncredibleEmployment.be  This test is no t yet approved or cleared by the [place] FDA and  has been authorized for detection and/or diagnosis of SARS-CoV-2 by FDA under an Emergency Use Authorization (EUA). This EUA will remain  in effect (meaning this test can be used) for the duration of the COVID-19 declaration under Section 564(b)(1) of the Act, 21 U.S.C.section 360bbb-3(b)(1), unless the authorization is terminated  or revoked sooner.       Influenza A by PCR NEGATIVE NEGATIVE Final   Influenza B by PCR NEGATIVE NEGATIVE Final    Comment: (NOTE) The Xpert Xpress SARS-CoV-2/FLU/RSV plus assay is intended as an aid in the diagnosis of influenza from Nasopharyngeal swab specimens  and should not be used as a sole basis for treatment. Nasal washings and aspirates are unacceptable for Xpert Xpress SARS-CoV-2/FLU/RSV testing.  Fact Sheet for Patients: EntrepreneurPulse.com.au  Fact Sheet for Healthcare Providers: IncredibleEmployment.be  This test is not yet approved or cleared by the [place] FDA and has been authorized for detection and/or diagnosis of SARS-CoV-2 by FDA under an Emergency Use Authorization (EUA). This EUA will remain in effect (meaning this test can be used) for the duration of the COVID-19 declaration under Section 564(b)(1) of the Act, 21 U.S.C. section 360bbb-3(b)(1), unless the authorization is terminated or revoked.  Performed at [facility], [address] [place], [place] [address]   Culture, blood (routine x 2)     Status: None (Preliminary result)   Collection Time: 01/01/21  5:44 PM   Specimen: BLOOD  Result Value Ref Range Status   Specimen Description   Final    BLOOD RIGHT ANTECUBITAL Performed at [facility], [address] [place], [place] [address]    Special Requests   Final    BOTTLES DRAWN AEROBIC AND ANAEROBIC Blood Culture adequate volume Performed at [facility], [address], [place], [place] [address]    Culture   Final    NO GROWTH 2 DAYS Performed at [facility], [place] [address] [place] [address], [place], [place] [address]    Report Status PENDING  Incomplete  Culture, blood (routine x 2)     Status: None (Preliminary result)   Collection Time: 01/01/21  5:44 PM   Specimen: BLOOD RIGHT ARM  Result Value Ref Range Status   Specimen Description   Final    BLOOD RIGHT ARM Performed at [facility], [place] [address], [place], [place] [address]    Special Requests   Final    BOTTLES DRAWN AEROBIC AND ANAEROBIC Blood Culture adequate volume Performed at [facility], [address]  [place], [place] [address]    Culture   Final    NO GROWTH 2 DAYS Performed at [facility], [place] [address] [place], [place] [address]    Report Status PENDING  Incomplete  Expectorated Sputum Assessment w Gram Stain, Rflx to Resp Cult     Status: None   Collection Time: 01/02/21 11:05 AM   Specimen: Expectorated Sputum  Result Value Ref Range Status   Specimen Description EXPECTORATED SPUTUM  Final   Special Requests NONE  Final   Sputum evaluation  Final    THIS SPECIMEN IS ACCEPTABLE FOR SPUTUM CULTURE Performed at [facility] [address] [place], [place] [address]    Report Status 01/02/2021 FINAL  Final  Culture, Respiratory w Gram Stain     Status: None (Preliminary result)   Collection Time: 01/02/21 11:05 AM  Result Value Ref Range Status   Specimen Description   Final    EXPECTORATED SPUTUM Performed at [facility] [address] [place], [place] [address]    Special Requests   Final    NONE Reflexed from [id number] Performed at [facility], [place] [address], [place], [place] [address]    Gram Stain   Final    FEW SQUAMOUS EPITHELIAL CELLS PRESENT FEW WBC SEEN FEW GRAM NEGATIVE RODS FEW GRAM POSITIVE COCCI FEW GRAM POSITIVE RODS    Culture   Final    CULTURE REINCUBATED FOR BETTER GROWTH Performed at [facility], [place] [address], [place], [place] [address]    Report Status PENDING  Incomplete     Radiology Studies: No results found.  Marzetta Board, MD, PhD [facility]  Between 7 am - 7 pm I am available, please contact me via Amion (for emergencies) or Securechat (non urgent messages)  Between 7 pm - 7 am I am not available, please contact night coverage MD/APP via Amion

## 2021-01-03 NOTE — Progress Notes (Signed)
Initial Nutrition Assessment  DOCUMENTATION CODES:   Non-severe (moderate) malnutrition in context of chronic illness  INTERVENTION:   -Magic cup BID with meals, each supplement provides 290 kcal and 9 grams of protein  -Multivitamin with minerals daily  NUTRITION DIAGNOSIS:   Moderate Malnutrition related to chronic illness as evidenced by moderate fat depletion, severe muscle depletion.  GOAL:   Patient will meet greater than or equal to 90% of their needs  MONITOR:   PO intake, Labs, Weight trends, I & O's  REASON FOR ASSESSMENT:   Consult Assessment of nutrition requirement/status  ASSESSMENT:   87 year old female with past medical history of COPD, hypertension, bronchiectasis (follows with Dr. Annamaria Boots), non-insulin-dependent diabetes mellitus type 2, gastroesophageal reflux disease, diastolic congestive heart failure (Echo 12/2019 EF 60-65% with G1DD), macular degeneration and legal blindness who presents to [facility] hospital as a transfer from [facility] due to concerns for pneumonia.  Patient in room having vitals taken. Pt states she is not feeling well today and didn't get much sleep. Pt is eating well, consuming 75-100% of meals. States she has not been eating as much as she should PTA. Pt was having increased SOB for 4 weeks PTA.  Also reports her diet has changed since she has been unable to cook for herself. Doesn't eat as much vegetables as she would like.  Pt does not like Ensure/Boost drinks, states they also raise her blood sugar.  Will order Magic cups with meals.  Per weight records, pt has had no weight changes recently.   Medications reviewed.  Labs reviewed:  CBGs: 90-306  NUTRITION - FOCUSED PHYSICAL EXAM:  Flowsheet Row Most Recent Value  Orbital Region Moderate depletion  Upper Arm Region Severe depletion  Thoracic and Lumbar Region Unable to assess  Buccal Region Moderate depletion  Temple Region Moderate depletion  Clavicle  Bone Region Severe depletion  Clavicle and Acromion Bone Region Severe depletion  Scapular Bone Region Severe depletion  Dorsal Hand Severe depletion  Patellar Region Severe depletion  Anterior Thigh Region Unable to assess  Posterior Calf Region Severe depletion  Edema (RD Assessment) None  Hair Reviewed  Eyes Reviewed  Mouth Reviewed       Diet Order:   Diet Order             Diet heart healthy/carb modified Room service appropriate? Yes; Fluid consistency: Thin  Diet effective now                   EDUCATION NEEDS:   No education needs have been identified at this time  Skin:  Skin Assessment: Reviewed RN Assessment  Last BM:  9/14 -type 6  Height:   Ht Readings from Last 1 Encounters:  01/02/21 '5\' 8"'$  (1.727 m)    Weight:   Wt Readings from Last 1 Encounters:  01/02/21 57.8 kg    BMI:  Body mass index is 19.37 kg/m.  Estimated Nutritional Needs:   Kcal:  1450-1650  Protein:  65-75g  Fluid:  1.6L/day  Clayton Bibles, MS, RD, LDN Inpatient Clinical Dietitian Contact information available via Amion

## 2021-01-03 NOTE — Progress Notes (Signed)
Pt is not feeling well at this time and says she is unable to do flutter at this time.

## 2021-01-03 NOTE — Evaluation (Signed)
Occupational Therapy Evaluation Patient Details Name: Katie Alvarado MRN: [id number] DOB: 06-17-1925 Today's Date: 01/03/2021   History of Present Illness Patient is a 87 yo female admitted with Pna, weakness. Hx of COPD, bronchiectasis, DM, GERD, mac deg, dizziness   Clinical Impression     Patient evaluated by Occupational Therapy with no further acute OT needs identified. All education has been completed and the patient has no further questions. Patient is at baseline for ADLs with MI to complete tasks with Sup needed for mobility with visual deficits at baseline. See below for any follow-up Occupational Therapy or equipment needs. OT is signing off. Thank you for this referral.    Recommendations for follow up therapy are one component of a multi-disciplinary discharge planning process, led by the attending physician.  Recommendations may be updated based on patient status, additional functional criteria and insurance authorization.   Follow Up Recommendations  No OT follow up    Equipment Recommendations       Recommendations for Other Services       Precautions / Restrictions        Mobility Bed Mobility               General bed mobility comments: seated in recliner    Transfers   Equipment used: Rolling walker (2 wheeled)   Sit to Stand: Modified independent (Device/Increase time)              Balance Overall balance assessment: Mild deficits observed, not formally tested                                         ADL either performed or assessed with clinical judgement   ADL Overall ADL's : Modified independent                                       General ADL Comments: patient is able to complete ADLs with cues for visual deficits in new environment with MI at RW level. patient was able to particiapte in LB dressing ( socks and undergarments), toilet trasnfers, functional mobiltiy on this date. patietn has caregiver  support in next level of care and able to get more if needed     Vision Baseline Vision/History: 2 Legally blind       Perception     Praxis      Pertinent Vitals/Pain Pain Assessment: 0-10 Pain Score: 1  Pain Location: headache Pain Descriptors / Indicators: Dull;Headache Pain Intervention(s): Monitored during session;Premedicated before session     Hand Dominance Right   Extremity/Trunk Assessment Upper Extremity Assessment Upper Extremity Assessment: Generalized weakness   Lower Extremity Assessment Lower Extremity Assessment: Defer to PT evaluation   Cervical / Trunk Assessment Cervical / Trunk Assessment: Kyphotic   Communication Communication Communication: No difficulties   Cognition Arousal/Alertness: Awake/alert Behavior During Therapy: WFL for tasks assessed/performed Overall Cognitive Status: Within Functional Limits for tasks assessed                                     General Comments       Exercises     Shoulder Instructions      Home Living Family/patient expects to be discharged to:: Private residence Living Arrangements:  Alone Available Help at Discharge: Friend(s);Neighbor;Available PRN/intermittently Type of Home: House Home Access: Ramped entrance     Home Layout: One level     Bathroom Shower/Tub: Occupational psychologist: Handicapped height     Home Equipment: Environmental consultant - 4 wheels;Bedside commode;Shower seat - built in;Cane - single point;Grab bars - tub/shower          Prior Functioning/Environment Level of Independence: Needs assistance  Gait / Transfers Assistance Needed: uses rollator. sometimes "furniture walks" ADL's / Homemaking Assistance Needed: does not drive; assist with groceries, meals, transporation. patient reported having people there most of day except for two hours which is " her time". patient is able to complete toileting, bathing and dressing herself.   Comments: patient reported  she likes to do laundry but has help with it now.        OT Problem List: Impaired vision/perception      OT Treatment/Interventions:      OT Goals(Current goals can be found in the care plan section) Acute Rehab OT Goals Patient Stated Goal: to go home OT Goal Formulation: All assessment and education complete, DC therapy  OT Frequency:     Barriers to D/C:            Co-evaluation              AM-PAC OT "6 Clicks" Daily Activity     Outcome Measure Help from another person eating meals?: None Help from another person taking care of personal grooming?: None Help from another person toileting, which includes using toliet, bedpan, or urinal?: None Help from another person bathing (including washing, rinsing, drying)?: None Help from another person to put on and taking off regular upper body clothing?: None Help from another person to put on and taking off regular lower body clothing?: None 6 Click Score: 24   End of Session Equipment Utilized During Treatment: Gait belt;Rolling walker Nurse Communication: Mobility status  Activity Tolerance: Patient tolerated treatment well Patient left: in chair;with call bell/phone within reach;with chair alarm set  OT Visit Diagnosis: History of falling (Z91.81)                Time: 3149-7026 OT Time Calculation (min): 34 min Charges:  OT General Charges $OT Visit: 1 Visit OT Evaluation $OT Eval Low Complexity: 1 Low OT Treatments $Self Care/Home Management : 8-22 mins  Jackelyn Poling OTR/L, MS Acute Rehabilitation Department Office# [phone] Pager# [place] 01/03/2021, 1:40 PM

## 2021-01-04 LAB — BASIC METABOLIC PANEL
Anion gap: 10 (ref 5–15)
BUN: 18 mg/dL (ref 8–23)
CO2: 26 mmol/L (ref 22–32)
Calcium: 9.3 mg/dL (ref 8.9–10.3)
Chloride: 99 mmol/L (ref 98–111)
Creatinine, Ser: 0.78 mg/dL (ref 0.44–1.00)
GFR, Estimated: >60 mL/min (ref >60)
Glucose, Bld: 142 mg/dL — ABNORMAL HIGH (ref 70–99)
Potassium: 3.2 mmol/L — ABNORMAL LOW (ref 3.5–5.1)
Sodium: 135 mmol/L (ref 135–145)

## 2021-01-04 LAB — CULTURE, RESPIRATORY W GRAM STAIN: Culture: NORMAL

## 2021-01-04 LAB — CBC
HCT: 31.3 % — ABNORMAL LOW (ref 36.0–46.0)
Hemoglobin: 10.1 g/dL — ABNORMAL LOW (ref 12.0–15.0)
MCH: 28 pg (ref 26.0–34.0)
MCHC: 32.3 g/dL (ref 30.0–36.0)
MCV: 86.7 fL (ref 80.0–100.0)
Platelets: 433 10*3/uL — ABNORMAL HIGH (ref 150–400)
RBC: 3.61 MIL/uL — ABNORMAL LOW (ref 3.87–5.11)
RDW: 13.1 % (ref 11.5–15.5)
WBC: 7.1 10*3/uL (ref 4.0–10.5)
nRBC: 0 % (ref 0.0–0.2)

## 2021-01-04 LAB — GLUCOSE, CAPILLARY
Glucose-Capillary: 177 mg/dL — ABNORMAL HIGH (ref 70–99)
Glucose-Capillary: 240 mg/dL — ABNORMAL HIGH (ref 70–99)
Glucose-Capillary: 303 mg/dL — ABNORMAL HIGH (ref 70–99)
Glucose-Capillary: 153 mg/dL — ABNORMAL HIGH (ref 70–99)

## 2021-01-04 MED ORDER — PREDNISONE 20 MG PO TABS
20.0000 mg | ORAL_TABLET | Freq: Every day | ORAL | Status: DC
  Administered 2021-01-05: 20 mg via ORAL
  Filled 2021-01-04: qty 1

## 2021-01-04 MED ORDER — POTASSIUM CHLORIDE CRYS ER 20 MEQ PO TBCR
40.0000 meq | EXTENDED_RELEASE_TABLET | Freq: Once | ORAL | Status: AC
  Administered 2021-01-04: 40 meq via ORAL
  Filled 2021-01-04: qty 2

## 2021-01-04 NOTE — Progress Notes (Signed)
Mobility Specialist - Progress Note    01/04/21 1534  Mobility  Activity Ambulated in hall;Transferred to/from [facility]  Level of Assistance Minimal assist, patient does 75% or more  Assistive Device Front wheel walker  Distance Ambulated (ft) 450 ft  Mobility Ambulated with assistance in hallway;Ambulated with assistance in room  Mobility Response Tolerated well  Mobility performed by Mobility specialist  $Mobility charge 1 Mobility    Upon entering, pt was agreeable to ambulate and required Min A to stand from EOB and required extra time to feel steady. RW was used to help pt walk 450 ft in hallway. No episodes of pain, SOB, or dizziness were reported. Pt returned to room and sat EOB before requesting to use BSC. Once done, pt returned to bed and was left with call bell at side.   [place] Specialist Acute Rehabilitation Services Phone: [phone] 01/04/21, 3:38 PM

## 2021-01-04 NOTE — Progress Notes (Signed)
PROGRESS NOTE  Katie Alvarado [id number] DOB: 1925/05/29 DOA: 01/01/2021 PCP: [name], MD   LOS: 3 days   Brief Narrative / Interim history: 87 year old female with history of COPD, HTN, bronchiectasis following with Dr. Annamaria Boots with pulmonology as an outpatient, NIDDM, chronic diastolic CHF who comes into the hospital with cough and progressive shortness of breath.  She has been having increased cough and more shortness of breath over the last couple of weeks, symptoms have gotten worse and eventually decided to come to the hospital.  Chest x-ray showed patchy right lower lobe infiltrate concerning for pneumonia, she was placed on antibiotics and admitted.  She was also wheezing on admission was placed on steroids  Subjective / 24h Interval events: She tells me she is feeling better this morning.  Rested well overnight and overall had a good night  Assessment & Plan: Principal Problem Multifocal pneumonia, COPD exacerbation-CT scan of the chest with infiltrate/bronchopneumonia in the posterior segment right upper lobe, superior segment right lower lobe, left lower lobe diffusely in the lingula. -Patient was placed on Zosyn, continue. -She was also placed on steroids along with bronchodilators for COPD exacerbation, continue.  Breathing is improved, still short of breath with ambulation.  Change steroids to oral -Continue supportive care, antitussives.  Thankfully, she is on room air   Active Problems: Hypertensive urgency -blood pressure overall improved, she was started on hydralazine  GERD-continue PPI  DM2 -with hyperglycemia, A1c 6.6 but currently with elevated CBGs in the setting of steroid use.  CBGs controlled  CBG (last 3)  Recent Labs    01/03/21 1620 01/03/21 2016 01/04/21 0731  GLUCAP 168* 148* 177*    Chronic diastolic CHF (congestive heart failure) (HCC) -Euvolemic   Scheduled Meds:  aspirin EC  81 mg Oral Daily   budesonide  0.25 mg Nebulization BID    carvedilol  12.5 mg Oral BID WC   enoxaparin (LOVENOX) injection  40 mg Subcutaneous Q24H   hydrALAZINE  50 mg Oral Q8H   insulin aspart  0-15 Units Subcutaneous TID AC & HS   ipratropium-albuterol  3 mL Nebulization TID   irbesartan  300 mg Oral Daily   loratadine  10 mg Oral Daily   methylPREDNISolone (SOLU-MEDROL) injection  40 mg Intravenous Q24H   multivitamin with minerals  1 tablet Oral Daily   pantoprazole  40 mg Oral Daily   Continuous Infusions:  piperacillin-tazobactam (ZOSYN)  IV 3.375 g (01/04/21 0555)   PRN Meds:.acetaminophen **OR** acetaminophen, ALPRAZolam, benzonatate, guaiFENesin-codeine, hydrALAZINE, ipratropium-albuterol, ondansetron **OR** ondansetron (ZOFRAN) IV, polyethylene glycol, polyvinyl alcohol  Diet Orders (From admission, onward)     Start     Ordered   01/02/21 0215  Diet heart healthy/carb modified Room service appropriate? Yes; Fluid consistency: Thin  Diet effective now       Question Answer Comment  Diet-HS Snack? Nothing   Room service appropriate? Yes   Fluid consistency: Thin      01/02/21 0217            DVT prophylaxis: enoxaparin (LOVENOX) injection 40 mg Start: 01/02/21 1000     Code Status: DNR  Family Communication: Will call daughter in the afternoon  Status is: Inpatient  Remains inpatient appropriate because:Inpatient level of care appropriate due to severity of illness  Dispo: The patient is from: Home              Anticipated d/c is to: Home  Patient currently is not medically stable to d/c.   Difficult to place patient No   Level of care: Telemetry  Consultants:  None   Procedures:  None   Microbiology  Blood cultures 9/12-no growth to date Respiratory culture-GNR, GPC, G PR  Antimicrobials: Zosyn 9/12 >>   Objective: Vitals:   01/03/21 1759 01/03/21 2014 01/04/21 0428 01/04/21 0822  BP: (!) 184/86 131/62 (!) 168/77   Pulse: 79 80 88   Resp: '14 16 16   '$ Temp: (!) 97.4 F (36.3 C)  97.6 F (36.4 C) 98.1 F (36.7 C)   TempSrc: Oral Oral Oral   SpO2: 95% 95% 94% 90%  Weight:      Height:        Intake/Output Summary (Last 24 hours) at 01/04/2021 1127 Last data filed at 01/04/2021 0940 Gross per 24 hour  Intake 340 ml  Output --  Net 340 ml    Filed Weights   01/01/21 1526 01/02/21 0519  Weight: 54.9 kg 57.8 kg    Examination:  Constitutional: No distress Eyes: No icterus ENMT: mmm Neck: normal, supple Respiratory: Clear bilaterally, no wheezing, normal respiratory effort Cardiovascular: Regular rate and rhythm, no murmurs, no edema Abdomen: Soft, NT, ND, bowel sounds positive Musculoskeletal: no clubbing / cyanosis.  Skin: No rashes seen Neurologic: No focal deficits  Data Reviewed: I have independently reviewed following labs and imaging studies   CBC: Recent Labs  Lab 01/01/21 1538 01/02/21 0437 01/03/21 0453 01/04/21 0544  WBC 9.6 6.5 8.1 7.1  NEUTROABS 6.8 5.9 6.4  --   HGB 10.1* 10.0* 10.4* 10.1*  HCT 31.1* 29.9* 31.8* 31.3*  MCV 86.6 85.2 86.4 86.7  PLT 465* 395 428* 433*    Basic Metabolic Panel: Recent Labs  Lab 01/01/21 1538 01/02/21 0437 01/03/21 0453 01/04/21 0544  NA 133* 136 139 135  K 4.1 3.7 3.5 3.2*  CL 95* 98 99 99  CO2 '24 25 29 26  '$ GLUCOSE 182* 262* 177* 142*  BUN '10 10 12 18  '$ CREATININE 0.69 0.49 0.70 0.78  CALCIUM 8.7* 8.9 9.5 9.3  MG  --  1.1* 1.9  --   PHOS  --   --  3.0  --     Liver Function Tests: Recent Labs  Lab 01/02/21 0437 01/03/21 0453  AST 12* 14*  ALT 10 10  ALKPHOS 61 56  BILITOT 0.4 0.8  PROT 6.5 6.4*  ALBUMIN 3.2* 3.2*    Coagulation Profile: No results for input(s): INR, PROTIME in the last 168 hours. HbA1C: Recent Labs    01/02/21 0437  HGBA1C 6.6*    CBG: Recent Labs  Lab 01/03/21 0858 01/03/21 1127 01/03/21 1620 01/03/21 2016 01/04/21 0731  GLUCAP 306* 178* 168* 148* 177*     Recent Results (from the past 240 hour(s))  Resp Panel by RT-PCR (Flu A&B,  Covid) Nasopharyngeal Swab     Status: None   Collection Time: 01/01/21  3:38 PM   Specimen: Nasopharyngeal Swab; Nasopharyngeal(NP) swabs in vial transport medium  Result Value Ref Range Status   SARS Coronavirus 2 by RT PCR NEGATIVE NEGATIVE Final    Comment: (NOTE) SARS-CoV-2 target nucleic acids are NOT DETECTED.  The SARS-CoV-2 RNA is generally detectable in upper respiratory specimens during the acute phase of infection. The lowest concentration of SARS-CoV-2 viral copies this assay can detect is 138 copies/mL. A negative result does not preclude SARS-Cov-2 infection and should not be used as the sole basis for treatment or other patient  management decisions. A negative result may occur with  improper specimen collection/handling, submission of specimen other than nasopharyngeal swab, presence of viral mutation(s) within the areas targeted by this assay, and inadequate number of viral copies(<138 copies/mL). A negative result must be combined with clinical observations, patient history, and epidemiological information. The expected result is Negative.  Fact Sheet for Patients:  EntrepreneurPulse.com.au  Fact Sheet for Healthcare Providers:  IncredibleEmployment.be  This test is no t yet approved or cleared by the [place] FDA and  has been authorized for detection and/or diagnosis of SARS-CoV-2 by FDA under an Emergency Use Authorization (EUA). This EUA will remain  in effect (meaning this test can be used) for the duration of the COVID-19 declaration under Section 564(b)(1) of the Act, 21 U.S.C.section 360bbb-3(b)(1), unless the authorization is terminated  or revoked sooner.       Influenza A by PCR NEGATIVE NEGATIVE Final   Influenza B by PCR NEGATIVE NEGATIVE Final    Comment: (NOTE) The Xpert Xpress SARS-CoV-2/FLU/RSV plus assay is intended as an aid in the diagnosis of influenza from Nasopharyngeal swab specimens and should  not be used as a sole basis for treatment. Nasal washings and aspirates are unacceptable for Xpert Xpress SARS-CoV-2/FLU/RSV testing.  Fact Sheet for Patients: EntrepreneurPulse.com.au  Fact Sheet for Healthcare Providers: IncredibleEmployment.be  This test is not yet approved or cleared by the [place] FDA and has been authorized for detection and/or diagnosis of SARS-CoV-2 by FDA under an Emergency Use Authorization (EUA). This EUA will remain in effect (meaning this test can be used) for the duration of the COVID-19 declaration under Section 564(b)(1) of the Act, 21 U.S.C. section 360bbb-3(b)(1), unless the authorization is terminated or revoked.  Performed at [facility], [address], [place], [place] [address]   Culture, blood (routine x 2)     Status: None (Preliminary result)   Collection Time: 01/01/21  5:44 PM   Specimen: BLOOD  Result Value Ref Range Status   Specimen Description   Final    BLOOD RIGHT ANTECUBITAL Performed at [facility], [address], [place], [place] [address]    Special Requests   Final    BOTTLES DRAWN AEROBIC AND ANAEROBIC Blood Culture adequate volume Performed at [facility], [address] [place], [place] [address]    Culture   Final    NO GROWTH 3 DAYS Performed at [facility], [place] [address], [place], [place] [address]    Report Status PENDING  Incomplete  Culture, blood (routine x 2)     Status: None (Preliminary result)   Collection Time: 01/01/21  5:44 PM   Specimen: BLOOD RIGHT ARM  Result Value Ref Range Status   Specimen Description   Final    BLOOD RIGHT ARM Performed at [facility], [place] [address], [place], [place] [address]    Special Requests   Final    BOTTLES DRAWN AEROBIC AND ANAEROBIC Blood Culture adequate volume Performed at [facility], [address], [place], [place]  [address]    Culture   Final    NO GROWTH 3 DAYS Performed at [facility], [place] [address], [place], [place] [address]    Report Status PENDING  Incomplete  Expectorated Sputum Assessment w Gram Stain, Rflx to Resp Cult     Status: None   Collection Time: 01/02/21 11:05 AM   Specimen: Expectorated Sputum  Result Value Ref Range Status   Specimen Description EXPECTORATED  SPUTUM  Final   Special Requests NONE  Final   Sputum evaluation   Final    THIS SPECIMEN IS ACCEPTABLE FOR SPUTUM CULTURE Performed at [facility], [place] [address], [place], [place] [address]    Report Status 01/02/2021 FINAL  Final  Culture, Respiratory w Gram Stain     Status: None (Preliminary result)   Collection Time: 01/02/21 11:05 AM  Result Value Ref Range Status   Specimen Description   Final    EXPECTORATED SPUTUM Performed at [facility] [address], [place], [place] [address]    Special Requests   Final    NONE Reflexed from [id number] Performed at [facility], [place] [address] [place], [place] [address]    Gram Stain   Final    FEW SQUAMOUS EPITHELIAL CELLS PRESENT FEW WBC SEEN FEW GRAM NEGATIVE RODS FEW GRAM POSITIVE COCCI FEW GRAM POSITIVE RODS    Culture   Final    RARE Normal respiratory flora-no Staph aureus or Pseudomonas seen Performed at [facility], [address], [place], [place] [address]    Report Status PENDING  Incomplete      Radiology Studies: CT HEAD WO CONTRAST (5MM)  Result Date: 01/03/2021 CLINICAL DATA:  Dizziness EXAM: CT HEAD WITHOUT CONTRAST TECHNIQUE: Contiguous axial images were obtained from the base of the skull through the vertex without intravenous contrast. COMPARISON:  None. FINDINGS: Brain: There is no mass, hemorrhage or extra-axial collection. The size and configuration of the ventricles and extra-axial CSF spaces are normal. There is hypoattenuation of the white matter, most commonly  indicating chronic small vessel disease. Vascular: No abnormal hyperdensity of the major intracranial arteries or dural venous sinuses. No intracranial atherosclerosis. Skull: The visualized skull base, calvarium and extracranial soft tissues are normal. Sinuses/Orbits: No fluid levels or advanced mucosal thickening of the visualized paranasal sinuses. No mastoid or middle ear effusion. The orbits are normal. IMPRESSION: Chronic small vessel disease without acute intracranial abnormality. Electronically Signed   By: Ulyses Jarred M.D.   On: 01/03/2021 19:31    Marzetta Board, MD, PhD [facility]  Between 7 am - 7 pm I am available, please contact me via Amion (for emergencies) or Securechat (non urgent messages)  Between 7 pm - 7 am I am not available, please contact night coverage MD/APP via Amion

## 2021-01-05 ENCOUNTER — Encounter: Payer: Self-pay | Admitting: Internal Medicine

## 2021-01-05 LAB — GLUCOSE, CAPILLARY
Glucose-Capillary: 144 mg/dL — ABNORMAL HIGH (ref 70–99)
Glucose-Capillary: 231 mg/dL — ABNORMAL HIGH (ref 70–99)

## 2021-01-05 MED ORDER — AMOXICILLIN-POT CLAVULANATE 875-125 MG PO TABS: 1.0000 | ORAL_TABLET | Freq: Two times a day (BID) | ORAL | 0 refills | Status: AC

## 2021-01-05 MED ORDER — HYDRALAZINE HCL 25 MG PO TABS: 25.0000 mg | ORAL_TABLET | Freq: Three times a day (TID) | ORAL | 1 refills | Status: DC

## 2021-01-05 MED ORDER — GUAIFENESIN-CODEINE 100-10 MG/5ML PO SOLN: 2.5000 mL | Freq: Four times a day (QID) | ORAL | 0 refills | Status: DC | PRN

## 2021-01-05 MED ORDER — PREDNISONE 20 MG PO TABS: 20.0000 mg | ORAL_TABLET | Freq: Every day | ORAL | 0 refills | Status: AC

## 2021-01-05 NOTE — Progress Notes (Deleted)
PROGRESS NOTE  Katie Alvarado [id number] DOB: 21-Aug-1925 DOA: 01/01/2021 PCP: [name], MD   LOS: 4 days   Brief Narrative / Interim history: 87 year old female with history of COPD, HTN, bronchiectasis following with Dr. Annamaria Boots with pulmonology as an outpatient, NIDDM, chronic diastolic CHF who comes into the hospital with cough and progressive shortness of breath.  She has been having increased cough and more shortness of breath over the last couple of weeks, symptoms have gotten worse and eventually decided to come to the hospital.  Chest x-ray showed patchy right lower lobe infiltrate concerning for pneumonia, she was placed on antibiotics and admitted.  She was also wheezing on admission was placed on steroids  Subjective / 24h Interval events: Slept well last night, feels fairly good this morning.  Breathing is improving.  Assessment & Plan: Principal Problem Multifocal pneumonia, COPD exacerbation-CT scan of the chest with infiltrate/bronchopneumonia in the posterior segment right upper lobe, superior segment right lower lobe, left lower lobe diffusely in the lingula. -Patient was placed on Zosyn, continue.  Would have ideally narrowed her to doxycycline but she is allergic to it.  Plan on keeping her on Zosyn and transition to Augmentin on discharge -She was also placed on steroids along with bronchodilators for COPD exacerbation, continue.  Breathing is improved, still short of breath with ambulation.  Seems to be doing well with oral steroids, continue -Continue supportive care, antitussives.  Thankfully, she is on room air   Active Problems: Hypertensive urgency -still has high intermittent readings, continue hydralazine.  Daughter tells me that she has been running high blood pressures at home as well.  GERD-continue PPI  DM2 -with hyperglycemia, A1c 6.6 but currently with elevated CBGs in the setting of steroid use.  CBGs controlled  CBG (last 3)  Recent Labs     01/04/21 1749 01/04/21 2026 01/05/21 0741  GLUCAP 303* 153* 144*    Chronic diastolic CHF (congestive heart failure) (HCC) -Euvolemic  PT recommends 24-hour supervision which she currently does not have.  Daughter will come from [place] tomorrow and will live with the patient for several days and anticipate discharge home on Saturday once safe disposition is set, and she will have 24-hour care  Scheduled Meds:  aspirin EC  81 mg Oral Daily   budesonide  0.25 mg Nebulization BID   carvedilol  12.5 mg Oral BID WC   enoxaparin (LOVENOX) injection  40 mg Subcutaneous Q24H   hydrALAZINE  50 mg Oral Q8H   insulin aspart  0-15 Units Subcutaneous TID AC & HS   ipratropium-albuterol  3 mL Nebulization TID   irbesartan  300 mg Oral Daily   loratadine  10 mg Oral Daily   multivitamin with minerals  1 tablet Oral Daily   pantoprazole  40 mg Oral Daily   predniSONE  20 mg Oral Q breakfast   Continuous Infusions:  piperacillin-tazobactam (ZOSYN)  IV 12.5 mL/hr at 01/05/21 0620   PRN Meds:.acetaminophen **OR** acetaminophen, ALPRAZolam, benzonatate, guaiFENesin-codeine, hydrALAZINE, ipratropium-albuterol, ondansetron **OR** ondansetron (ZOFRAN) IV, polyethylene glycol, polyvinyl alcohol  Diet Orders (From admission, onward)     Start     Ordered   01/02/21 0215  Diet heart healthy/carb modified Room service appropriate? Yes; Fluid consistency: Thin  Diet effective now       Question Answer Comment  Diet-HS Snack? Nothing   Room service appropriate? Yes   Fluid consistency: Thin      01/02/21 0217  DVT prophylaxis: enoxaparin (LOVENOX) injection 40 mg Start: 01/02/21 1000     Code Status: DNR  Family Communication: Will call daughter in the afternoon  Status is: Inpatient  Remains inpatient appropriate because:Inpatient level of care appropriate due to severity of illness  Dispo: The patient is from: Home              Anticipated d/c is to: Home               Patient currently is not medically stable to d/c.   Difficult to place patient No   Level of care: Telemetry  Consultants:  None   Procedures:  None   Microbiology  Blood cultures 9/12-no growth to date Respiratory culture-GNR, GPC, G PR  Antimicrobials: Zosyn 9/12 >>   Objective: Vitals:   01/04/21 2024 01/04/21 2130 01/05/21 0546 01/05/21 0755  BP: (!) 194/83 (!) 164/84 (!) 182/82   Pulse: 74  87   Resp: 14  16   Temp: (!) 97.5 F (36.4 C)  97.8 F (36.6 C)   TempSrc: Oral  Oral   SpO2: 97%  95% 98%  Weight:      Height:        Intake/Output Summary (Last 24 hours) at 01/05/2021 1125 Last data filed at 01/05/2021 [id number] Gross per 24 hour  Intake 460 ml  Output 1100 ml  Net -640 ml    Filed Weights   01/01/21 1526 01/02/21 0519  Weight: 54.9 kg 57.8 kg    Examination:  Constitutional: NAD, in chair Eyes: No scleral icterus ENMT: Moist mucous membranes Neck: normal, supple Respiratory: Clear bilaterally, no wheezing Cardiovascular: Regular rate and rhythm, no edema Abdomen: nondistended, bowel sounds positive Musculoskeletal: no clubbing / cyanosis.  Skin: No rashes Neurologic: nonfocal  Data Reviewed: I have independently reviewed following labs and imaging studies   CBC: Recent Labs  Lab 01/01/21 1538 01/02/21 0437 01/03/21 0453 01/04/21 0544  WBC 9.6 6.5 8.1 7.1  NEUTROABS 6.8 5.9 6.4  --   HGB 10.1* 10.0* 10.4* 10.1*  HCT 31.1* 29.9* 31.8* 31.3*  MCV 86.6 85.2 86.4 86.7  PLT 465* 395 428* 433*    Basic Metabolic Panel: Recent Labs  Lab 01/01/21 1538 01/02/21 0437 01/03/21 0453 01/04/21 0544  NA 133* 136 139 135  K 4.1 3.7 3.5 3.2*  CL 95* 98 99 99  CO2 '24 25 29 26  '$ GLUCOSE 182* 262* 177* 142*  BUN '10 10 12 18  '$ CREATININE 0.69 0.49 0.70 0.78  CALCIUM 8.7* 8.9 9.5 9.3  MG  --  1.1* 1.9  --   PHOS  --   --  3.0  --     Liver Function Tests: Recent Labs  Lab 01/02/21 0437 01/03/21 0453  AST 12* 14*  ALT 10 10  ALKPHOS  61 56  BILITOT 0.4 0.8  PROT 6.5 6.4*  ALBUMIN 3.2* 3.2*    Coagulation Profile: No results for input(s): INR, PROTIME in the last 168 hours. HbA1C: No results for input(s): HGBA1C in the last 72 hours.  CBG: Recent Labs  Lab 01/04/21 0731 01/04/21 1151 01/04/21 1749 01/04/21 2026 01/05/21 0741  GLUCAP 177* 240* 303* 153* 144*     Recent Results (from the past 240 hour(s))  Resp Panel by RT-PCR (Flu A&B, Covid) Nasopharyngeal Swab     Status: None   Collection Time: 01/01/21  3:38 PM   Specimen: Nasopharyngeal Swab; Nasopharyngeal(NP) swabs in vial transport medium  Result Value Ref Range Status   SARS  Coronavirus 2 by RT PCR NEGATIVE NEGATIVE Final    Comment: (NOTE) SARS-CoV-2 target nucleic acids are NOT DETECTED.  The SARS-CoV-2 RNA is generally detectable in upper respiratory specimens during the acute phase of infection. The lowest concentration of SARS-CoV-2 viral copies this assay can detect is 138 copies/mL. A negative result does not preclude SARS-Cov-2 infection and should not be used as the sole basis for treatment or other patient management decisions. A negative result may occur with  improper specimen collection/handling, submission of specimen other than nasopharyngeal swab, presence of viral mutation(s) within the areas targeted by this assay, and inadequate number of viral copies(<138 copies/mL). A negative result must be combined with clinical observations, patient history, and epidemiological information. The expected result is Negative.  Fact Sheet for Patients:  EntrepreneurPulse.com.au  Fact Sheet for Healthcare Providers:  IncredibleEmployment.be  This test is no t yet approved or cleared by the [place] FDA and  has been authorized for detection and/or diagnosis of SARS-CoV-2 by FDA under an Emergency Use Authorization (EUA). This EUA will remain  in effect (meaning this test can be used) for the  duration of the COVID-19 declaration under Section 564(b)(1) of the Act, 21 U.S.C.section 360bbb-3(b)(1), unless the authorization is terminated  or revoked sooner.       Influenza A by PCR NEGATIVE NEGATIVE Final   Influenza B by PCR NEGATIVE NEGATIVE Final    Comment: (NOTE) The Xpert Xpress SARS-CoV-2/FLU/RSV plus assay is intended as an aid in the diagnosis of influenza from Nasopharyngeal swab specimens and should not be used as a sole basis for treatment. Nasal washings and aspirates are unacceptable for Xpert Xpress SARS-CoV-2/FLU/RSV testing.  Fact Sheet for Patients: EntrepreneurPulse.com.au  Fact Sheet for Healthcare Providers: IncredibleEmployment.be  This test is not yet approved or cleared by the [place] FDA and has been authorized for detection and/or diagnosis of SARS-CoV-2 by FDA under an Emergency Use Authorization (EUA). This EUA will remain in effect (meaning this test can be used) for the duration of the COVID-19 declaration under Section 564(b)(1) of the Act, 21 U.S.C. section 360bbb-3(b)(1), unless the authorization is terminated or revoked.  Performed at [facility], [address], [place], [place] [address]   Culture, blood (routine x 2)     Status: None (Preliminary result)   Collection Time: 01/01/21  5:44 PM   Specimen: BLOOD  Result Value Ref Range Status   Specimen Description   Final    BLOOD RIGHT ANTECUBITAL Performed at [facility], [address] [place], [place] [address]    Special Requests   Final    BOTTLES DRAWN AEROBIC AND ANAEROBIC Blood Culture adequate volume Performed at [facility], [address] [place], [place] [address]    Culture   Final    NO GROWTH 4 DAYS Performed at [facility], [place] [address], [place], [place] [address]    Report Status PENDING  Incomplete  Culture, blood (routine x 2)     Status:  None (Preliminary result)   Collection Time: 01/01/21  5:44 PM   Specimen: BLOOD RIGHT ARM  Result Value Ref Range Status   Specimen Description   Final    BLOOD RIGHT ARM Performed at [facility], [place] [address] [place], [place] [address]    Special Requests   Final    BOTTLES DRAWN AEROBIC AND ANAEROBIC Blood Culture adequate volume Performed at [facility], [address] [place],  [address]  Culture   Final    NO GROWTH 4 DAYS Performed at [facility], [place] [address], [place], [place] [address]    Report Status PENDING  Incomplete  Expectorated Sputum Assessment w Gram Stain, Rflx to Resp Cult     Status: None   Collection Time: 01/02/21 11:05 AM   Specimen: Expectorated Sputum  Result Value Ref Range Status   Specimen Description EXPECTORATED SPUTUM  Final   Special Requests NONE  Final   Sputum evaluation   Final    THIS SPECIMEN IS ACCEPTABLE FOR SPUTUM CULTURE Performed at [facility], [place] [address] [place], [place] [address]    Report Status 01/02/2021 FINAL  Final  Culture, Respiratory w Gram Stain     Status: None   Collection Time: 01/02/21 11:05 AM  Result Value Ref Range Status   Specimen Description   Final    EXPECTORATED SPUTUM Performed at [facility], [place] [address], [place], [place] [address]    Special Requests   Final    NONE Reflexed from [id number] Performed at [facility], [place] [address], [place], [place] [address]    Gram Stain   Final    FEW SQUAMOUS EPITHELIAL CELLS PRESENT FEW WBC SEEN FEW GRAM NEGATIVE RODS FEW GRAM POSITIVE COCCI FEW GRAM POSITIVE RODS    Culture   Final    RARE Normal respiratory flora-no Staph aureus or Pseudomonas seen Performed at [facility], [address], [place], [place] [address]    Report Status 01/04/2021 FINAL  Final      Radiology Studies: No results found.  Marzetta Board, MD, PhD [facility]  Between 7 am - 7 pm I am available, please contact me via Amion (for emergencies) or Securechat (non urgent messages)  Between 7 pm - 7 am I am not available, please contact night coverage MD/APP via Amion

## 2021-01-05 NOTE — TOC Transition Note (Signed)
Transition of Care [facility]) - CM/SW Discharge Note   Patient Details  Name: RAVENNA CHIBA MRN: [id number] Date of Birth: Aug 14, 1925  Transition of Care [facility]) CM/SW Contact:  Lynnell Catalan, RN Phone Number: 01/05/2021, 3:37 PM   Clinical Narrative:      Spoke with pt for dc planning. Pt states that she will have 24hr care at home when she discharges. She has a RW and 3in1 at home.  She declines home health services at this time.

## 2021-01-05 NOTE — Discharge Instructions (Signed)
Follow with Katie Cruel, MD in 5-7 days  Please get a complete blood count and chemistry panel checked by your Primary MD at your next visit, and again as instructed by your Primary MD. Please get your medications reviewed and adjusted by your Primary MD.  Please request your Primary MD to go over all Hospital Tests and Procedure/Radiological results at the follow up, please get all Hospital records sent to your Prim MD by signing hospital release before you go home.  In some cases, there will be blood work, cultures and biopsy results pending at the time of your discharge. Please request that your primary care M.D. goes through all the records of your hospital data and follows up on these results.  If you had Pneumonia of Lung problems at the Hospital: Please get a 2 view Chest X ray done in 6-8 weeks after hospital discharge or sooner if instructed by your Primary MD.  If you have Congestive Heart Failure: Please call your Cardiologist or Primary MD anytime you have any of the following symptoms:  1) 3 pound weight gain in 24 hours or 5 pounds in 1 week  2) shortness of breath, with or without a dry hacking cough  3) swelling in the hands, feet or stomach  4) if you have to sleep on extra pillows at night in order to breathe  Follow cardiac low salt diet and 1.5 lit/day fluid restriction.  If you have diabetes Accuchecks 4 times/day, Once in AM empty stomach and then before each meal. Log in all results and show them to your primary doctor at your next visit. If any glucose reading is under 80 or above 300 call your primary MD immediately.  If you have Seizure/Convulsions/Epilepsy: Please do not drive, operate heavy machinery, participate in activities at heights or participate in high speed sports until you have seen by Primary MD or a Neurologist and advised to do so again. Per [facility] statutes, patients with seizures are not allowed to drive until they have been  seizure-free for six months.  Use caution when using heavy equipment or power tools. Avoid working on ladders or at heights. Take showers instead of baths. Ensure the water temperature is not too high on the home water heater. Do not go swimming alone. Do not lock yourself in a room alone (i.e. bathroom). When caring for infants or small children, sit down when holding, feeding, or changing them to minimize risk of injury to the child in the event you have a seizure. Maintain good sleep hygiene. Avoid alcohol.   If you had Gastrointestinal Bleeding: Please ask your Primary MD to check a complete blood count within one week of discharge or at your next visit. Your endoscopic/colonoscopic biopsies that are pending at the time of discharge, will also need to followed by your Primary MD.  Get Medicines reviewed and adjusted. Please take all your medications with you for your next visit with your Primary MD  Please request your Primary MD to go over all hospital tests and procedure/radiological results at the follow up, please ask your Primary MD to get all Hospital records sent to his/her office.  If you experience worsening of your admission symptoms, develop shortness of breath, life threatening emergency, suicidal or homicidal thoughts you must seek medical attention immediately by calling 911 or calling your MD immediately  if symptoms less severe.  You must read complete instructions/literature along with all the possible adverse reactions/side effects for all the Medicines you  take and that have been prescribed to you. Take any new Medicines after you have completely understood and accpet all the possible adverse reactions/side effects.   Do not drive or operate heavy machinery when taking Pain medications.   Do not take more than prescribed Pain, Sleep and Anxiety Medications  Special Instructions: If you have smoked or chewed Tobacco  in the last 2 yrs please stop smoking, stop any regular  Alcohol  and or any Recreational drug use.  Wear Seat belts while driving.  Please note You were cared for by a hospitalist during your hospital stay. If you have any questions about your discharge medications or the care you received while you were in the hospital after you are discharged, you can call the unit and asked to speak with the hospitalist on call if the hospitalist that took care of you is not available. Once you are discharged, your primary care physician will handle any further medical issues. Please note that NO REFILLS for any discharge medications will be authorized once you are discharged, as it is imperative that you return to your primary care physician (or establish a relationship with a primary care physician if you do not have one) for your aftercare needs so that they can reassess your need for medications and monitor your lab values.  You can reach the hospitalist office at phone [phone] or fax [phone]   If you do not have a primary care physician, you can call [phone] for a physician referral.  Activity: As tolerated with Full fall precautions use walker/cane & assistance as needed    Diet: regular  Disposition Home

## 2021-01-05 NOTE — Progress Notes (Signed)
PT Cancellation Note  Patient Details Name: Katie Alvarado MRN: [id number] DOB: 10/20/1925   Cancelled Treatment:    Reason Eval/Treat Not Completed: Fatigue limiting ability to participate. Patient reports recently ambulated in hall and is tired.    Claretha Cooper 01/05/2021, 4:21 PM Tresa Endo PT Acute Rehabilitation Services Pager [phone] Office [phone]

## 2021-01-05 NOTE — Discharge Summary (Signed)
Physician Discharge Summary  Katie Alvarado [id number] DOB: 1925-12-16 DOA: 01/01/2021  PCP: [name], MD  Admit date: 01/01/2021 Discharge date: 01/05/2021  Admitted From: home Disposition:  home  Recommendations for Outpatient Follow-up:  Follow up with PCP in 1-2 weeks  Home Health: HHPT Equipment/Devices: none  Discharge Condition: stable CODE STATUS: DNR Diet recommendation: regular  HPI: Per admitting MD, 87 year old female with past medical history of COPD, hypertension, bronchiectasis (follows with Dr. Annamaria Boots), non-insulin-dependent diabetes mellitus type 2, gastroesophageal reflux disease, diastolic congestive heart failure (Echo 12/2019 EF 60-65% with G1DD), macular degeneration and legal blindness who presents to [facility] hospital as a transfer from [facility] due to concerns for pneumonia. Patient explains that for approximately the past 4 weeks she has been experiencing shortness of breath.  Initially shortness of breath was mild in intensity but in the weeks that followed became more more severe.  Shortness of breath became associated with an exacerbation of patient's chronic cough which has become more frequent with increased sputum production compared to her baseline. As patient's symptoms have progressed, she has developed progressively worsening generalized weakness and poor appetite with increasing dyspnea on exertion.  Patient symptoms continued to worsen and the patient contacted her pulmonologist on 9/8 via phone call with these complaints and was prescribed a supply of Mucinex DM and Tessalon Perles. Patient explains that despite taking this new medication she continued to experience progressively worsening symptoms.  Patient's symptoms became so severe that she eventually presented to [facility] emergency department for evaluation. Upon evaluation in the emergency department, chest x-ray revealed developing patchy right lower lobe  infiltrate concerning for pneumonia.  Patient was initiated on intravenous ceftriaxone and azithromycin.  Possible group was then called excepted the patient for transfer to [facility] long hospital for continued medical care.  Hospital Course / Discharge diagnoses: Principal Problem Multifocal pneumonia, COPD exacerbation-CT scan of the chest with infiltrate/bronchopneumonia in the posterior segment right upper lobe, superior segment right lower lobe, left lower lobe diffusely in the lingula. Patient was admitted to the hospital and placed on antibiotics, steroids and nebulizers. Clinically she has improved, she is afebrile, wheezing has resolved, WBC is normal and she was able to ambulate in the hallway with PT without difficulties and on room air. She has returned to baseline and will be discharged home in stable condition. She will be transitioned to Augmentin for 4 more days and a quick prednisone course.   Active Problems: Hypertensive urgency -hydralazine was added to her regimen given high BP readings. Recommend outpatient follow up GERD-continue PPI DM2 -with steroid induced hyperglycemia, A1c 6.6 showing good control at baseline. Continue metformin  Sepsis ruled out   Discharge Instructions   Allergies as of 01/05/2021       Reactions   Welchol [colesevelam Hcl] Hives   Amlodipine    Ankle swelling   Atorvastatin    Other reaction(s): foot pain   Coreg [carvedilol]    dizzy   Doxycycline    rash   Glucosamine Other (See Comments)   Unknown   Mobic [meloxicam]    Sulfa Antibiotics    itching   Telmisartan    itching   Tramadol Nausea Only   Celecoxib Hives, Itching, Rash   Statins Rash        Medication List     STOP taking these medications    azithromycin 250 MG tablet Commonly known as: ZITHROMAX   guaiFENesin-dextromethorphan 100-10 MG/5ML syrup Commonly known as: ROBITUSSIN  DM   promethazine-codeine 6.25-10 MG/5ML syrup Commonly known as: PHENERGAN with  CODEINE       TAKE these medications    acetaminophen 325 MG tablet Commonly known as: TYLENOL Take 650 mg by mouth every 4 (four) hours as needed for mild pain.   albuterol 108 (90 Base) MCG/ACT inhaler Commonly known as: VENTOLIN HFA INHALE 2 PUFFS INTO THE LUNGS EVERY 6 HOURS AS NEEDED FOR WHEEZING OR SHORTNESS OF BREATH   ALPRAZolam 0.25 MG tablet Commonly known as: XANAX Take 0.25 mg by mouth 3 (three) times daily as needed for anxiety or sleep.   amLODipine 2.5 MG tablet Commonly known as: NORVASC Take 2.5 mg by mouth daily.   amoxicillin-clavulanate 875-125 MG tablet Commonly known as: Augmentin Take 1 tablet by mouth every 12 (twelve) hours for 4 days.   aspirin EC 81 MG tablet Take 81 mg by mouth daily. Swallow whole.   benzonatate 200 MG capsule Commonly known as: TESSALON Take 1 capsule (200 mg total) by mouth 3 (three) times daily as needed for cough.   budesonide 0.25 MG/2ML nebulizer solution Commonly known as: PULMICORT USE 1 VIAL  IN  NEBULIZER TWICE  DAILY - rinse mouth after treatment What changed: See the new instructions.   carboxymethylcellulose 0.5 % Soln Commonly known as: REFRESH PLUS Place 1 drop into both eyes as needed (dry eyes).   carvedilol 6.25 MG tablet Commonly known as: COREG Take 6.25 mg by mouth 2 (two) times daily with a meal.   cholecalciferol 1000 units tablet Commonly known as: VITAMIN D Take 2,000 Units by mouth daily.   Cinnamon 500 MG capsule Take 1,000 mg by mouth 2 (two) times daily as needed (for sugar levels).   clobetasol 0.05 % external solution Commonly known as: TEMOVATE Apply 1 application topically daily as needed for irritation.   esomeprazole 40 MG capsule Commonly known as: NEXIUM Take 40 mg by mouth daily.   fluocinolone 0.025 % ointment Commonly known as: SYNALAR Apply 1 application topically 2 (two) times daily as needed for irritation. Apply Monday-Friday only to scalp, eyebrows, and nasal  creases twice daily as needed for irritation.   Flutter Devi Use as directed   furosemide 20 MG tablet Commonly known as: LASIX Take 20 mg by mouth daily.   guaiFENesin-codeine 100-10 MG/5ML syrup Take 2.5 mLs by mouth every 6 (six) hours as needed for cough.   hydrALAZINE 25 MG tablet Commonly known as: APRESOLINE Take 1 tablet (25 mg total) by mouth 3 (three) times daily.   ipratropium 0.02 % nebulizer solution Commonly known as: ATROVENT USE 1 VIAL IN NEBULIZER 4 TIMES DAILY What changed: See the new instructions.   irbesartan 300 MG tablet Commonly known as: AVAPRO TAKE 1 TABLET BY MOUTH EVERY DAY   ketoconazole 2 % shampoo Commonly known as: NIZORAL Apply 1 application topically See admin instructions. Apply to scalp 2 to 3 times per week.   meclizine 12.5 MG tablet Commonly known as: ANTIVERT Take 12.5 mg by mouth 3 (three) times daily as needed for dizziness.   metFORMIN 500 MG tablet Commonly known as: GLUCOPHAGE Take 1,000 mg by mouth 2 (two) times daily.   ondansetron 4 MG tablet Commonly known as: ZOFRAN Take 4 mg by mouth every 6 (six) hours as needed for nausea or vomiting.   ONE TOUCH ULTRA TEST test strip Generic drug: glucose blood USE TO CHECK BLOOD SUGAR ONCE DAILY 90   OneTouch Delica Lancets 99991111 Misc USE TO CHECK BLOOD SUGAR ONCE  DAILY AS DIRECTED   polyvinyl alcohol 1.4 % ophthalmic solution Commonly known as: LIQUIFILM TEARS Place 1 drop into both eyes as needed for dry eyes.   predniSONE 20 MG tablet Commonly known as: DELTASONE Take 1 tablet (20 mg total) by mouth daily with breakfast for 3 days. Start taking on: January 06, 2021   PRESERVISION AREDS 2 PO Take 1 tablet by mouth in the morning and at bedtime.   traMADol 50 MG tablet Commonly known as: ULTRAM Take 0.5 tablets (25 mg total) by mouth every 6 (six) hours as needed for moderate pain.   vitamin E 180 MG (400 UNITS) capsule Take 400 Units by mouth 2 (two) times a  week.         Consultations: none  Procedures/Studies: none  DG Chest 2 View  Result Date: 01/01/2021 CLINICAL DATA:  Weakness, shortness of breath, history of COPD EXAM: CHEST - 2 VIEW COMPARISON:  Chest radiograph 12/27/2020 FINDINGS: The cardiomediastinal silhouette is stable. There is dense calcified atherosclerotic plaque throughout the aorta. There are patchy interstitial markings throughout both lungs. Focal opacity projecting over the right upper lobe is unchanged since the most recent prior study but increased in conspicuity since 12/23/2019 there are increased opacities in the right lower lobe since the prior study. There is blunting of the left costophrenic angle, unchanged and likely chronic. The right costophrenic angle is normal. There is no pneumothorax. There is no acute osseous abnormality. IMPRESSION: 1. Patchy opacities in the right lower lobe are increased in conspicuity since the prior study and may reflect developing infection in the correct clinical setting. 2. Nodular appearing opacity projecting over the right upper lobe is not significantly changed compared to the most recent study but increased since 2021. Recommend nonemergent outpatient CT chest to evaluate for parenchymal nodule. 3. Additional patchy opacities throughout both lungs are overall stable and likely reflect chronic changes. Electronically Signed   By: Valetta Mole M.D.   On: 01/01/2021 16:11   DG Chest 2 View  Result Date: 12/28/2020 CLINICAL DATA:  87 year old female with a history of cough EXAM: CHEST - 2 VIEW COMPARISON:  02/08/2020, 12/23/2019, 01/07/2019 FINDINGS: Cardiomediastinal silhouette unchanged in size and contour. No evidence of central vascular congestion. No interlobular septal thickening. Stigmata of emphysema, with increased retrosternal airspace, flattened hemidiaphragms, increased AP diameter, and hyperinflation on the AP view. Reticulonodular opacities at the periphery of the lungs and  in the mid lungs, have increased from the study dated 12/23/2019 and with similar distribution to the plain film dated 02/08/2020. No significant change from the plain film dated 02/08/2020. Similar appearance of nodular density in the right upper lung compared to the most recent. No pleural effusion or pneumothorax. Architectural distortion with changes of bronchiectasis unchanged from prior. No acute displaced fracture. Degenerative changes of the spine. IMPRESSION: Relatively unchanged appearance of the chest x-ray when compared to the most recent prior, most compatible with chronic changes/fibrosis and background of emphysema without definite evidence of superimposed acute cardiopulmonary disease. Electronically Signed   By: Corrie Mckusick D.O.   On: 12/28/2020 14:49   CT HEAD WO CONTRAST (5MM)  Result Date: 01/03/2021 CLINICAL DATA:  Dizziness EXAM: CT HEAD WITHOUT CONTRAST TECHNIQUE: Contiguous axial images were obtained from the base of the skull through the vertex without intravenous contrast. COMPARISON:  None. FINDINGS: Brain: There is no mass, hemorrhage or extra-axial collection. The size and configuration of the ventricles and extra-axial CSF spaces are normal. There is hypoattenuation of the white matter,  most commonly indicating chronic small vessel disease. Vascular: No abnormal hyperdensity of the major intracranial arteries or dural venous sinuses. No intracranial atherosclerosis. Skull: The visualized skull base, calvarium and extracranial soft tissues are normal. Sinuses/Orbits: No fluid levels or advanced mucosal thickening of the visualized paranasal sinuses. No mastoid or middle ear effusion. The orbits are normal. IMPRESSION: Chronic small vessel disease without acute intracranial abnormality. Electronically Signed   By: Ulyses Jarred M.D.   On: 01/03/2021 19:31   CT CHEST W CONTRAST  Result Date: 01/02/2021 CLINICAL DATA:  Interstitial infiltrates. History of bronchiectasis with cough  EXAM: CT CHEST WITH CONTRAST TECHNIQUE: Multidetector CT imaging of the chest was performed during intravenous contrast administration. CONTRAST:  61m OMNIPAQUE IOHEXOL 350 MG/ML SOLN COMPARISON:  01/08/2019 FINDINGS: Cardiovascular: Normal heart size. No pericardial effusion. Extensive atheromatous calcification of coronaries in the aorta. Mediastinum/Nodes: Negative for adenopathy or mass. Lungs/Pleura: Patchy bronchiectasis, primarily cystic and varicose. Areas of greatest involvement in the posterior segment right upper lobe, superior segment right lower lobe, diffusely in the left lower lobe, and at the lingula. Severity is greatest at the lingula where there is collapse and cystic involvement, chronic and stable. Bronchiectasis has progressed especially in the superior segment right lower lobe and at the left base. Generalized airway thickening with multiple bronchiectatic air-fluid levels and increased reticulonodular opacity especially at the lingula and in the right lower lobe. Air trapping seen at the left lower lobe. No lobar consolidation. Upper Abdomen: Negative Musculoskeletal: Posterior right twelfth rib fracture which is corticated appearing and likely chronic. Minimally covered right eleventh rib fracture which is age indeterminate IMPRESSION: 1. Increased reticulonodular opacities compatible with bronchitis exacerbation/bronchopneumonia. 2. Background of bronchiectasis which has progressed from 2020. 3. Nonacute appearing right twelfth rib fracture. Partially covered and age-indeterminate right eleventh rib fracture. 4.  Aortic Atherosclerosis (ICD10-I70.0). Electronically Signed   By: JJorje GuildM.D.   On: 01/02/2021 08:08     Subjective: - no chest pain, shortness of breath, no abdominal pain, nausea or vomiting.   Discharge Exam: BP (!) 154/71 (BP Location: Left Arm)   Pulse 85   Temp (!) 97.4 F (36.3 C) (Oral)   Resp 15   Ht '5\' 8"'$  (1.727 m)   Wt 57.8 kg   SpO2 94%   BMI  19.37 kg/m   General: Pt is alert, awake, not in acute distress Cardiovascular: RRR, S1/S2 +, no rubs, no gallops Respiratory: CTA bilaterally, no wheezing, no rhonchi Abdominal: Soft, NT, ND, bowel sounds + Extremities: no edema, no cyanosis    The results of significant diagnostics from this hospitalization (including imaging, microbiology, ancillary and laboratory) are listed below for reference.     Microbiology: Recent Results (from the past 240 hour(s))  Resp Panel by RT-PCR (Flu A&B, Covid) Nasopharyngeal Swab     Status: None   Collection Time: 01/01/21  3:38 PM   Specimen: Nasopharyngeal Swab; Nasopharyngeal(NP) swabs in vial transport medium  Result Value Ref Range Status   SARS Coronavirus 2 by RT PCR NEGATIVE NEGATIVE Final    Comment: (NOTE) SARS-CoV-2 target nucleic acids are NOT DETECTED.  The SARS-CoV-2 RNA is generally detectable in upper respiratory specimens during the acute phase of infection. The lowest concentration of SARS-CoV-2 viral copies this assay can detect is 138 copies/mL. A negative result does not preclude SARS-Cov-2 infection and should not be used as the sole basis for treatment or other patient management decisions. A negative result may occur with  improper specimen collection/handling, submission of  specimen other than nasopharyngeal swab, presence of viral mutation(s) within the areas targeted by this assay, and inadequate number of viral copies(<138 copies/mL). A negative result must be combined with clinical observations, patient history, and epidemiological information. The expected result is Negative.  Fact Sheet for Patients:  EntrepreneurPulse.com.au  Fact Sheet for Healthcare Providers:  IncredibleEmployment.be  This test is no t yet approved or cleared by the [place] FDA and  has been authorized for detection and/or diagnosis of SARS-CoV-2 by FDA under an Emergency Use Authorization  (EUA). This EUA will remain  in effect (meaning this test can be used) for the duration of the COVID-19 declaration under Section 564(b)(1) of the Act, 21 U.S.C.section 360bbb-3(b)(1), unless the authorization is terminated  or revoked sooner.       Influenza A by PCR NEGATIVE NEGATIVE Final   Influenza B by PCR NEGATIVE NEGATIVE Final    Comment: (NOTE) The Xpert Xpress SARS-CoV-2/FLU/RSV plus assay is intended as an aid in the diagnosis of influenza from Nasopharyngeal swab specimens and should not be used as a sole basis for treatment. Nasal washings and aspirates are unacceptable for Xpert Xpress SARS-CoV-2/FLU/RSV testing.  Fact Sheet for Patients: EntrepreneurPulse.com.au  Fact Sheet for Healthcare Providers: IncredibleEmployment.be  This test is not yet approved or cleared by the [place] FDA and has been authorized for detection and/or diagnosis of SARS-CoV-2 by FDA under an Emergency Use Authorization (EUA). This EUA will remain in effect (meaning this test can be used) for the duration of the COVID-19 declaration under Section 564(b)(1) of the Act, 21 U.S.C. section 360bbb-3(b)(1), unless the authorization is terminated or revoked.  Performed at [facility], [address], [place], [place] [address]   Culture, blood (routine x 2)     Status: None (Preliminary result)   Collection Time: 01/01/21  5:44 PM   Specimen: BLOOD  Result Value Ref Range Status   Specimen Description   Final    BLOOD RIGHT ANTECUBITAL Performed at [facility], [address] [place], [place] [address]    Special Requests   Final    BOTTLES DRAWN AEROBIC AND ANAEROBIC Blood Culture adequate volume Performed at [facility], [address], [place], [place] [address]    Culture   Final    NO GROWTH 4 DAYS Performed at [facility], [place] [address] [place], [place] [address]     Report Status PENDING  Incomplete  Culture, blood (routine x 2)     Status: None (Preliminary result)   Collection Time: 01/01/21  5:44 PM   Specimen: BLOOD RIGHT ARM  Result Value Ref Range Status   Specimen Description   Final    BLOOD RIGHT ARM Performed at [facility], [place] [address], [place], [place] [address]    Special Requests   Final    BOTTLES DRAWN AEROBIC AND ANAEROBIC Blood Culture adequate volume Performed at [facility], [address] [place], [place] [address]    Culture   Final    NO GROWTH 4 DAYS Performed at [facility], [place] [address], [place], [place] [address]    Report Status PENDING  Incomplete  Expectorated Sputum Assessment w Gram Stain, Rflx to Resp Cult     Status: None   Collection Time: 01/02/21 11:05 AM   Specimen: Expectorated Sputum  Result Value Ref Range Status   Specimen Description EXPECTORATED SPUTUM  Final   Special Requests NONE  Final   Sputum evaluation  Final    THIS SPECIMEN IS ACCEPTABLE FOR SPUTUM CULTURE Performed at [facility] [address], [place], [place] [address]    Report Status 01/02/2021 FINAL  Final  Culture, Respiratory w Gram Stain     Status: None   Collection Time: 01/02/21 11:05 AM  Result Value Ref Range Status   Specimen Description   Final    EXPECTORATED SPUTUM Performed at [facility], [place] [address], [place], [place] [address]    Special Requests   Final    NONE Reflexed from [id number] Performed at [facility], [place] [address], [place], [place] [address]    Gram Stain   Final    FEW SQUAMOUS EPITHELIAL CELLS PRESENT FEW WBC SEEN FEW GRAM NEGATIVE RODS FEW GRAM POSITIVE COCCI FEW GRAM POSITIVE RODS    Culture   Final    RARE Normal respiratory flora-no Staph aureus or Pseudomonas seen Performed at [facility], [address], [place], [place] [address]    Report Status 01/04/2021 FINAL  Final      Labs: Basic Metabolic Panel: Recent Labs  Lab 01/01/21 1538 01/02/21 0437 01/03/21 0453 01/04/21 0544  NA 133* 136 139 135  K 4.1 3.7 3.5 3.2*  CL 95* 98 99 99  CO2 '24 25 29 26  '$ GLUCOSE 182* 262* 177* 142*  BUN '10 10 12 18  '$ CREATININE 0.69 0.49 0.70 0.78  CALCIUM 8.7* 8.9 9.5 9.3  MG  --  1.1* 1.9  --   PHOS  --   --  3.0  --    Liver Function Tests: Recent Labs  Lab 01/02/21 0437 01/03/21 0453  AST 12* 14*  ALT 10 10  ALKPHOS 61 56  BILITOT 0.4 0.8  PROT 6.5 6.4*  ALBUMIN 3.2* 3.2*   CBC: Recent Labs  Lab 01/01/21 1538 01/02/21 0437 01/03/21 0453 01/04/21 0544  WBC 9.6 6.5 8.1 7.1  NEUTROABS 6.8 5.9 6.4  --   HGB 10.1* 10.0* 10.4* 10.1*  HCT 31.1* 29.9* 31.8* 31.3*  MCV 86.6 85.2 86.4 86.7  PLT 465* 395 428* 433*   CBG: Recent Labs  Lab 01/04/21 1151 01/04/21 1749 01/04/21 2026 01/05/21 0741 01/05/21 1210  GLUCAP 240* 303* 153* 144* 231*   Hgb A1c No results for input(s): HGBA1C in the last 72 hours. Lipid Profile No results for input(s): CHOL, HDL, LDLCALC, TRIG, CHOLHDL, LDLDIRECT in the last 72 hours. Thyroid function studies No results for input(s): TSH, T4TOTAL, T3FREE, THYROIDAB in the last 72 hours.  Invalid input(s): FREET3 Urinalysis    Component Value Date/Time   COLORURINE COLORLESS (A) 01/01/2021 2011   APPEARANCEUR CLEAR 01/01/2021 2011   LABSPEC 1.006 01/01/2021 2011   PHURINE 6.5 01/01/2021 2011   GLUCOSEU NEGATIVE 01/01/2021 2011   HGBUR NEGATIVE 01/01/2021 2011   [place] NEGATIVE 01/01/2021 2011   KETONESUR NEGATIVE 01/01/2021 2011   PROTEINUR TRACE (A) 01/01/2021 2011   UROBILINOGEN 0.2 12/19/2018 1155   NITRITE NEGATIVE 01/01/2021 2011   LEUKOCYTESUR NEGATIVE 01/01/2021 2011    FURTHER DISCHARGE INSTRUCTIONS:   Get Medicines reviewed and adjusted: Please take all your medications with you for your next visit with your Primary MD   Laboratory/radiological data: Please request your Primary MD to go over  all hospital tests and procedure/radiological results at the follow up, please ask your Primary MD to get all Hospital records sent to his/her office.   In some cases, they will be blood work, cultures and biopsy results pending  at the time of your discharge. Please request that your primary care M.D. goes through all the records of your hospital data and follows up on these results.   Also Note the following: If you experience worsening of your admission symptoms, develop shortness of breath, life threatening emergency, suicidal or homicidal thoughts you must seek medical attention immediately by calling 911 or calling your MD immediately  if symptoms less severe.   You must read complete instructions/literature along with all the possible adverse reactions/side effects for all the Medicines you take and that have been prescribed to you. Take any new Medicines after you have completely understood and accpet all the possible adverse reactions/side effects.    Do not drive when taking Pain medications or sleeping medications (Benzodaizepines)   Do not take more than prescribed Pain, Sleep and Anxiety Medications. It is not advisable to combine anxiety,sleep and pain medications without talking with your primary care practitioner   Special Instructions: If you have smoked or chewed Tobacco  in the last 2 yrs please stop smoking, stop any regular Alcohol  and or any Recreational drug use.   Wear Seat belts while driving.   Please note: You were cared for by a hospitalist during your hospital stay. Once you are discharged, your primary care physician will handle any further medical issues. Please note that NO REFILLS for any discharge medications will be authorized once you are discharged, as it is imperative that you return to your primary care physician (or establish a relationship with a primary care physician if you do not have one) for your post hospital discharge needs so that they can reassess  your need for medications and monitor your lab values.  Time coordinating discharge: 40 minutes  SIGNED:  Marzetta Board, MD, PhD 01/05/2021, 3:32 PM

## 2021-01-06 LAB — CULTURE, BLOOD (ROUTINE X 2)
Culture: NO GROWTH
Culture: NO GROWTH
Special Requests: ADEQUATE
Special Requests: ADEQUATE

## 2021-01-11 ENCOUNTER — Telehealth: Payer: Self-pay | Admitting: Internal Medicine

## 2021-01-11 NOTE — Telephone Encounter (Signed)
Attempted to call Fritz Pickerel, on DPR. Phone kept ringing. Will try later.

## 2021-01-12 DIAGNOSIS — J471 Bronchiectasis with (acute) exacerbation: Secondary | ICD-10-CM | POA: Diagnosis not present

## 2021-01-12 DIAGNOSIS — I10 Essential (primary) hypertension: Secondary | ICD-10-CM | POA: Diagnosis not present

## 2021-01-12 DIAGNOSIS — E785 Hyperlipidemia, unspecified: Secondary | ICD-10-CM | POA: Diagnosis not present

## 2021-01-12 DIAGNOSIS — M1710 Unilateral primary osteoarthritis, unspecified knee: Secondary | ICD-10-CM | POA: Diagnosis not present

## 2021-01-12 DIAGNOSIS — K219 Gastro-esophageal reflux disease without esophagitis: Secondary | ICD-10-CM | POA: Diagnosis not present

## 2021-01-12 DIAGNOSIS — D509 Iron deficiency anemia, unspecified: Secondary | ICD-10-CM | POA: Diagnosis not present

## 2021-01-12 DIAGNOSIS — E1149 Type 2 diabetes mellitus with other diabetic neurological complication: Secondary | ICD-10-CM | POA: Diagnosis not present

## 2021-01-12 DIAGNOSIS — D649 Anemia, unspecified: Secondary | ICD-10-CM | POA: Diagnosis not present

## 2021-01-12 DIAGNOSIS — I119 Hypertensive heart disease without heart failure: Secondary | ICD-10-CM | POA: Diagnosis not present

## 2021-01-12 DIAGNOSIS — M1990 Unspecified osteoarthritis, unspecified site: Secondary | ICD-10-CM | POA: Diagnosis not present

## 2021-01-12 DIAGNOSIS — J479 Bronchiectasis, uncomplicated: Secondary | ICD-10-CM | POA: Diagnosis not present

## 2021-01-15 DIAGNOSIS — E876 Hypokalemia: Secondary | ICD-10-CM | POA: Diagnosis not present

## 2021-01-15 DIAGNOSIS — B379 Candidiasis, unspecified: Secondary | ICD-10-CM | POA: Diagnosis not present

## 2021-01-15 DIAGNOSIS — Z09 Encounter for follow-up examination after completed treatment for conditions other than malignant neoplasm: Secondary | ICD-10-CM | POA: Diagnosis not present

## 2021-01-15 DIAGNOSIS — R531 Weakness: Secondary | ICD-10-CM | POA: Diagnosis not present

## 2021-01-15 DIAGNOSIS — D649 Anemia, unspecified: Secondary | ICD-10-CM | POA: Diagnosis not present

## 2021-01-15 DIAGNOSIS — I700 Atherosclerosis of aorta: Secondary | ICD-10-CM | POA: Diagnosis not present

## 2021-01-15 DIAGNOSIS — J441 Chronic obstructive pulmonary disease with (acute) exacerbation: Secondary | ICD-10-CM | POA: Diagnosis not present

## 2021-01-15 DIAGNOSIS — J479 Bronchiectasis, uncomplicated: Secondary | ICD-10-CM | POA: Diagnosis not present

## 2021-01-15 DIAGNOSIS — J180 Bronchopneumonia, unspecified organism: Secondary | ICD-10-CM | POA: Diagnosis not present

## 2021-01-15 NOTE — Telephone Encounter (Signed)
ATC, NA closing per protocol  I do not see any results pending to be reviewed with this pt

## 2021-01-16 DIAGNOSIS — I10 Essential (primary) hypertension: Secondary | ICD-10-CM | POA: Diagnosis not present

## 2021-01-16 DIAGNOSIS — J180 Bronchopneumonia, unspecified organism: Secondary | ICD-10-CM | POA: Diagnosis not present

## 2021-01-16 DIAGNOSIS — D649 Anemia, unspecified: Secondary | ICD-10-CM | POA: Diagnosis not present

## 2021-01-16 DIAGNOSIS — I700 Atherosclerosis of aorta: Secondary | ICD-10-CM | POA: Diagnosis not present

## 2021-01-16 DIAGNOSIS — E1140 Type 2 diabetes mellitus with diabetic neuropathy, unspecified: Secondary | ICD-10-CM | POA: Diagnosis not present

## 2021-01-16 DIAGNOSIS — J470 Bronchiectasis with acute lower respiratory infection: Secondary | ICD-10-CM | POA: Diagnosis not present

## 2021-01-16 DIAGNOSIS — H548 Legal blindness, as defined in USA: Secondary | ICD-10-CM | POA: Diagnosis not present

## 2021-01-16 DIAGNOSIS — Z7984 Long term (current) use of oral hypoglycemic drugs: Secondary | ICD-10-CM | POA: Diagnosis not present

## 2021-01-16 DIAGNOSIS — I701 Atherosclerosis of renal artery: Secondary | ICD-10-CM | POA: Diagnosis not present

## 2021-01-16 DIAGNOSIS — B379 Candidiasis, unspecified: Secondary | ICD-10-CM | POA: Diagnosis not present

## 2021-01-19 DIAGNOSIS — J180 Bronchopneumonia, unspecified organism: Secondary | ICD-10-CM | POA: Diagnosis not present

## 2021-01-19 DIAGNOSIS — E1140 Type 2 diabetes mellitus with diabetic neuropathy, unspecified: Secondary | ICD-10-CM | POA: Diagnosis not present

## 2021-01-19 DIAGNOSIS — I10 Essential (primary) hypertension: Secondary | ICD-10-CM | POA: Diagnosis not present

## 2021-01-19 DIAGNOSIS — I700 Atherosclerosis of aorta: Secondary | ICD-10-CM | POA: Diagnosis not present

## 2021-01-19 DIAGNOSIS — J470 Bronchiectasis with acute lower respiratory infection: Secondary | ICD-10-CM | POA: Diagnosis not present

## 2021-01-19 DIAGNOSIS — I701 Atherosclerosis of renal artery: Secondary | ICD-10-CM | POA: Diagnosis not present

## 2021-01-22 DIAGNOSIS — H353212 Exudative age-related macular degeneration, right eye, with inactive choroidal neovascularization: Secondary | ICD-10-CM | POA: Diagnosis not present

## 2021-01-22 DIAGNOSIS — H353223 Exudative age-related macular degeneration, left eye, with inactive scar: Secondary | ICD-10-CM | POA: Diagnosis not present

## 2021-01-22 DIAGNOSIS — H31092 Other chorioretinal scars, left eye: Secondary | ICD-10-CM | POA: Diagnosis not present

## 2021-01-22 DIAGNOSIS — H43821 Vitreomacular adhesion, right eye: Secondary | ICD-10-CM | POA: Diagnosis not present

## 2021-01-23 ENCOUNTER — Encounter: Payer: Self-pay | Admitting: Internal Medicine

## 2021-01-23 DIAGNOSIS — J180 Bronchopneumonia, unspecified organism: Secondary | ICD-10-CM | POA: Diagnosis not present

## 2021-01-23 DIAGNOSIS — I10 Essential (primary) hypertension: Secondary | ICD-10-CM | POA: Diagnosis not present

## 2021-01-23 DIAGNOSIS — I700 Atherosclerosis of aorta: Secondary | ICD-10-CM | POA: Diagnosis not present

## 2021-01-23 DIAGNOSIS — J470 Bronchiectasis with acute lower respiratory infection: Secondary | ICD-10-CM | POA: Diagnosis not present

## 2021-01-23 DIAGNOSIS — I701 Atherosclerosis of renal artery: Secondary | ICD-10-CM | POA: Diagnosis not present

## 2021-01-23 DIAGNOSIS — E1140 Type 2 diabetes mellitus with diabetic neuropathy, unspecified: Secondary | ICD-10-CM | POA: Diagnosis not present

## 2021-01-23 NOTE — Assessment & Plan Note (Signed)
No obvious acute infection. After careful discussion, will refill codeine cough syrup for symptom control.

## 2021-01-23 NOTE — Assessment & Plan Note (Signed)
Continue emphasis on reflux precautions.

## 2021-01-26 DIAGNOSIS — I701 Atherosclerosis of renal artery: Secondary | ICD-10-CM | POA: Diagnosis not present

## 2021-01-26 DIAGNOSIS — J470 Bronchiectasis with acute lower respiratory infection: Secondary | ICD-10-CM | POA: Diagnosis not present

## 2021-01-26 DIAGNOSIS — J180 Bronchopneumonia, unspecified organism: Secondary | ICD-10-CM | POA: Diagnosis not present

## 2021-01-26 DIAGNOSIS — E1140 Type 2 diabetes mellitus with diabetic neuropathy, unspecified: Secondary | ICD-10-CM | POA: Diagnosis not present

## 2021-01-26 DIAGNOSIS — I10 Essential (primary) hypertension: Secondary | ICD-10-CM | POA: Diagnosis not present

## 2021-01-26 DIAGNOSIS — I700 Atherosclerosis of aorta: Secondary | ICD-10-CM | POA: Diagnosis not present

## 2021-01-28 DIAGNOSIS — J180 Bronchopneumonia, unspecified organism: Secondary | ICD-10-CM | POA: Diagnosis not present

## 2021-01-28 DIAGNOSIS — I700 Atherosclerosis of aorta: Secondary | ICD-10-CM | POA: Diagnosis not present

## 2021-01-28 DIAGNOSIS — I10 Essential (primary) hypertension: Secondary | ICD-10-CM | POA: Diagnosis not present

## 2021-01-28 DIAGNOSIS — I701 Atherosclerosis of renal artery: Secondary | ICD-10-CM | POA: Diagnosis not present

## 2021-01-28 DIAGNOSIS — J470 Bronchiectasis with acute lower respiratory infection: Secondary | ICD-10-CM | POA: Diagnosis not present

## 2021-01-28 DIAGNOSIS — E1140 Type 2 diabetes mellitus with diabetic neuropathy, unspecified: Secondary | ICD-10-CM | POA: Diagnosis not present

## 2021-01-30 DIAGNOSIS — R059 Cough, unspecified: Secondary | ICD-10-CM | POA: Diagnosis not present

## 2021-01-31 DIAGNOSIS — I700 Atherosclerosis of aorta: Secondary | ICD-10-CM | POA: Diagnosis not present

## 2021-01-31 DIAGNOSIS — E1140 Type 2 diabetes mellitus with diabetic neuropathy, unspecified: Secondary | ICD-10-CM | POA: Diagnosis not present

## 2021-01-31 DIAGNOSIS — I10 Essential (primary) hypertension: Secondary | ICD-10-CM | POA: Diagnosis not present

## 2021-01-31 DIAGNOSIS — J180 Bronchopneumonia, unspecified organism: Secondary | ICD-10-CM | POA: Diagnosis not present

## 2021-01-31 DIAGNOSIS — J470 Bronchiectasis with acute lower respiratory infection: Secondary | ICD-10-CM | POA: Diagnosis not present

## 2021-01-31 DIAGNOSIS — I701 Atherosclerosis of renal artery: Secondary | ICD-10-CM | POA: Diagnosis not present

## 2021-02-02 DIAGNOSIS — E1140 Type 2 diabetes mellitus with diabetic neuropathy, unspecified: Secondary | ICD-10-CM | POA: Diagnosis not present

## 2021-02-02 DIAGNOSIS — J470 Bronchiectasis with acute lower respiratory infection: Secondary | ICD-10-CM | POA: Diagnosis not present

## 2021-02-02 DIAGNOSIS — J180 Bronchopneumonia, unspecified organism: Secondary | ICD-10-CM | POA: Diagnosis not present

## 2021-02-02 DIAGNOSIS — I701 Atherosclerosis of renal artery: Secondary | ICD-10-CM | POA: Diagnosis not present

## 2021-02-02 DIAGNOSIS — I10 Essential (primary) hypertension: Secondary | ICD-10-CM | POA: Diagnosis not present

## 2021-02-02 DIAGNOSIS — I700 Atherosclerosis of aorta: Secondary | ICD-10-CM | POA: Diagnosis not present

## 2021-02-05 DIAGNOSIS — I700 Atherosclerosis of aorta: Secondary | ICD-10-CM | POA: Diagnosis not present

## 2021-02-05 DIAGNOSIS — J470 Bronchiectasis with acute lower respiratory infection: Secondary | ICD-10-CM | POA: Diagnosis not present

## 2021-02-05 DIAGNOSIS — E1140 Type 2 diabetes mellitus with diabetic neuropathy, unspecified: Secondary | ICD-10-CM | POA: Diagnosis not present

## 2021-02-05 DIAGNOSIS — J180 Bronchopneumonia, unspecified organism: Secondary | ICD-10-CM | POA: Diagnosis not present

## 2021-02-05 DIAGNOSIS — I701 Atherosclerosis of renal artery: Secondary | ICD-10-CM | POA: Diagnosis not present

## 2021-02-05 DIAGNOSIS — I10 Essential (primary) hypertension: Secondary | ICD-10-CM | POA: Diagnosis not present

## 2021-02-12 DIAGNOSIS — J470 Bronchiectasis with acute lower respiratory infection: Secondary | ICD-10-CM | POA: Diagnosis not present

## 2021-02-12 DIAGNOSIS — I10 Essential (primary) hypertension: Secondary | ICD-10-CM | POA: Diagnosis not present

## 2021-02-12 DIAGNOSIS — I701 Atherosclerosis of renal artery: Secondary | ICD-10-CM | POA: Diagnosis not present

## 2021-02-12 DIAGNOSIS — E1140 Type 2 diabetes mellitus with diabetic neuropathy, unspecified: Secondary | ICD-10-CM | POA: Diagnosis not present

## 2021-02-12 DIAGNOSIS — J180 Bronchopneumonia, unspecified organism: Secondary | ICD-10-CM | POA: Diagnosis not present

## 2021-02-12 DIAGNOSIS — I700 Atherosclerosis of aorta: Secondary | ICD-10-CM | POA: Diagnosis not present

## 2021-02-13 DIAGNOSIS — Z79899 Other long term (current) drug therapy: Secondary | ICD-10-CM | POA: Diagnosis not present

## 2021-02-13 DIAGNOSIS — E46 Unspecified protein-calorie malnutrition: Secondary | ICD-10-CM | POA: Diagnosis not present

## 2021-02-13 DIAGNOSIS — R634 Abnormal weight loss: Secondary | ICD-10-CM | POA: Diagnosis not present

## 2021-02-13 DIAGNOSIS — R197 Diarrhea, unspecified: Secondary | ICD-10-CM | POA: Diagnosis not present

## 2021-02-13 DIAGNOSIS — D649 Anemia, unspecified: Secondary | ICD-10-CM | POA: Diagnosis not present

## 2021-02-13 DIAGNOSIS — R062 Wheezing: Secondary | ICD-10-CM | POA: Diagnosis not present

## 2021-02-13 DIAGNOSIS — R531 Weakness: Secondary | ICD-10-CM | POA: Diagnosis not present

## 2021-02-13 DIAGNOSIS — E1149 Type 2 diabetes mellitus with other diabetic neurological complication: Secondary | ICD-10-CM | POA: Diagnosis not present

## 2021-02-13 DIAGNOSIS — J479 Bronchiectasis, uncomplicated: Secondary | ICD-10-CM | POA: Diagnosis not present

## 2021-02-15 DIAGNOSIS — I700 Atherosclerosis of aorta: Secondary | ICD-10-CM | POA: Diagnosis not present

## 2021-02-15 DIAGNOSIS — H548 Legal blindness, as defined in USA: Secondary | ICD-10-CM | POA: Diagnosis not present

## 2021-02-15 DIAGNOSIS — I10 Essential (primary) hypertension: Secondary | ICD-10-CM | POA: Diagnosis not present

## 2021-02-15 DIAGNOSIS — Z7984 Long term (current) use of oral hypoglycemic drugs: Secondary | ICD-10-CM | POA: Diagnosis not present

## 2021-02-15 DIAGNOSIS — J180 Bronchopneumonia, unspecified organism: Secondary | ICD-10-CM | POA: Diagnosis not present

## 2021-02-15 DIAGNOSIS — B379 Candidiasis, unspecified: Secondary | ICD-10-CM | POA: Diagnosis not present

## 2021-02-15 DIAGNOSIS — D649 Anemia, unspecified: Secondary | ICD-10-CM | POA: Diagnosis not present

## 2021-02-15 DIAGNOSIS — I701 Atherosclerosis of renal artery: Secondary | ICD-10-CM | POA: Diagnosis not present

## 2021-02-15 DIAGNOSIS — J470 Bronchiectasis with acute lower respiratory infection: Secondary | ICD-10-CM | POA: Diagnosis not present

## 2021-02-15 DIAGNOSIS — E1140 Type 2 diabetes mellitus with diabetic neuropathy, unspecified: Secondary | ICD-10-CM | POA: Diagnosis not present

## 2021-02-18 DIAGNOSIS — J471 Bronchiectasis with (acute) exacerbation: Secondary | ICD-10-CM | POA: Diagnosis not present

## 2021-02-18 DIAGNOSIS — K219 Gastro-esophageal reflux disease without esophagitis: Secondary | ICD-10-CM | POA: Diagnosis not present

## 2021-02-18 DIAGNOSIS — E1149 Type 2 diabetes mellitus with other diabetic neurological complication: Secondary | ICD-10-CM | POA: Diagnosis not present

## 2021-02-18 DIAGNOSIS — I10 Essential (primary) hypertension: Secondary | ICD-10-CM | POA: Diagnosis not present

## 2021-02-18 DIAGNOSIS — M1990 Unspecified osteoarthritis, unspecified site: Secondary | ICD-10-CM | POA: Diagnosis not present

## 2021-02-18 DIAGNOSIS — M1710 Unilateral primary osteoarthritis, unspecified knee: Secondary | ICD-10-CM | POA: Diagnosis not present

## 2021-02-18 DIAGNOSIS — E785 Hyperlipidemia, unspecified: Secondary | ICD-10-CM | POA: Diagnosis not present

## 2021-02-18 DIAGNOSIS — D649 Anemia, unspecified: Secondary | ICD-10-CM | POA: Diagnosis not present

## 2021-02-18 DIAGNOSIS — J479 Bronchiectasis, uncomplicated: Secondary | ICD-10-CM | POA: Diagnosis not present

## 2021-02-18 DIAGNOSIS — D509 Iron deficiency anemia, unspecified: Secondary | ICD-10-CM | POA: Diagnosis not present

## 2021-02-19 ENCOUNTER — Telehealth: Payer: Self-pay | Admitting: Internal Medicine

## 2021-02-19 ENCOUNTER — Other Ambulatory Visit: Payer: Self-pay | Admitting: Internal Medicine

## 2021-02-19 DIAGNOSIS — J470 Bronchiectasis with acute lower respiratory infection: Secondary | ICD-10-CM | POA: Diagnosis not present

## 2021-02-19 DIAGNOSIS — I701 Atherosclerosis of renal artery: Secondary | ICD-10-CM | POA: Diagnosis not present

## 2021-02-19 DIAGNOSIS — E1140 Type 2 diabetes mellitus with diabetic neuropathy, unspecified: Secondary | ICD-10-CM | POA: Diagnosis not present

## 2021-02-19 DIAGNOSIS — I10 Essential (primary) hypertension: Secondary | ICD-10-CM | POA: Diagnosis not present

## 2021-02-19 DIAGNOSIS — I700 Atherosclerosis of aorta: Secondary | ICD-10-CM | POA: Diagnosis not present

## 2021-02-19 DIAGNOSIS — J180 Bronchopneumonia, unspecified organism: Secondary | ICD-10-CM | POA: Diagnosis not present

## 2021-02-20 NOTE — Telephone Encounter (Signed)
Called and spoke to pt's friend, Fritz Pickerel (on [place]). He states the pt is requesting a refill of Promethazine-Codeine syrup. This was last filled on 01/01/2021 for #180 with 0 refills. Fritz Pickerel states the pt typically uses it at night. Pt last seen 09/14/20 for bronchiolectasis. Pt was hospitalized on 01/01/21 for CAP. Malachi Pro if pt is feeling worse and coughing more then she needs OV. Fritz Pickerel states he thinks she needs OV due to pt's worsening s/s.    Dr. Annamaria Boots, please advise if ok to use held appt spot on 11/2 at 1130. Thanks!

## 2021-02-20 NOTE — Telephone Encounter (Signed)
Es thanks, ok to use held spot

## 2021-02-20 NOTE — Telephone Encounter (Signed)
The appt has been scheduled so will close encounter.

## 2021-02-20 NOTE — Progress Notes (Signed)
HPI F never smoker, followed for bronchiectasis/ chronic bronchitis/ MAIC, complicated by hx of GERD, DM , arthritis, aortic atherosclerosis Sputum Cx 06/09/12 POS MAIC  Started 07/14/12- Biaxin 500mg  TIW, EMB 1200 TIW, Rifabutin 300 TIW  ended- 08/12/12 due to weakness and malaise,  CT chest 06/01/12- Progressive bilateral nodular bronchiectasis Office spirometry  01/18/13- moderate obstructive airways disease-FVC 2.10/69%, FEV1 1.25/58%,  FEV1/FVC  0.59/ 84%,  FEF 25-75% 0.47/33% --------------------------------------------------------------------------------   09/14/20- 87 year old female never smoker followed for Bronchiectasis/chronic bronchitis/MAIC, complicated by history GERD, DM2, arthritis,hyponatremia, aortic atherosclerosis, Lumbar DDD/ back pain, HTN,  -Ventolin hfa, neb Perforomist/ Pulmicort/ Atrovent 0.02%, ,  Covid vax- -----6 month follow up, feels "terrible and weak" Chronic cough is wearing on her. Tessalon no help. Has a helper staying with her now. Limited activity.  02/21/21-  87 year old female never smoker followed for Bronchiectasis/chronic bronchitis/MAIC, complicated by history GERD, DM2, arthritis,hyponatremia, aortic atherosclerosis, Lumbar DDD/ back pain, HTN, dCHF, Maular Degenration,  -Ventolin hfa, neb Perforomist/ Pulmicort/ Atrovent 0.02%, tessalon, tramadol,  Covid vax-2 Phizer Flu vax-had Hosp 9/12-16/22- CAP-Multifocal on CT-Rx'd Rocephin, Zith, then augmentin -----C/o cough-clear, increased sob Recovered slowly from recent pneumonia.  Continues significantly limited by poor eyesight, back pain and debilitation. We discussed her CT scan from September.  Bronchiectasis assumed to reflect chronic microaspiration with appropriate discussion and education again done.  She high risk helpers and resists invitation to move in with her daughter. CTchest- 01/02/21- IMPRESSION: 1. Increased reticulonodular opacities compatible with  bronchitis exacerbation/bronchopneumonia. 2. Background of bronchiectasis which has progressed from 2020. 3. Nonacute appearing right twelfth rib fracture. Partially covered and age-indeterminate right eleventh rib fracture. 4.  Aortic Atherosclerosis (ICD10-I70.0).  ROS-see HPI + = positive Constitutional:     +weight loss, night sweats, no-fevers, chills,  +fatigue, lassitude. HEENT:   No-  headaches, difficulty swallowing, +tooth/dental problems, no-sore throat,       No-  sneezing, itching, ear ache, +nasal congestion, post nasal drip, + declining eyesight CV:  + chest pain, orthopnea, PND, swelling in lower extremities, anasarca,  dizziness, palpitations Resp: +Mild  shortness of breath with exertion or at rest.             +productive cough,  + non-productive cough,  No- coughing up of blood.              No-   change in color of mucus.  No- wheezing.   Skin: No-   rash or lesions. GI:  No-   heartburn, indigestion, abdominal pain, nausea, vomiting,  GU:  MS:  +Active low back and bilateral hip pain. L rib and L shoulder pain Neuro-     complaint of generalized weakness without myalgias Psych:  No- change in mood or affect. No depression or anxiety.  No memory loss.  Obj- Physical exam General- Alert, Oriented, Frail, NAD, gracious woman, trying to be stoic,  Skin- rash-none,  excoriation- none Lymphadenopathy- none Head- atraumatic            Eyes- + decreased vision            Ears- Hearing, canals-normal            Nose- Clear, no-Septal dev, mucus, polyps, erosion, perforation             Throat- Mallampati II , mucosa clear , drainage- none, tonsils- atrophic Neck- flexible , trachea midline, no stridor , thyroid nl, carotid no bruit Chest - symmetrical excursion , unlabored  Heart/CV- RRR , no murmur , no gallop  , no rub, nl s1 s2                           - JVD- none , edema- none, stasis changes- none, varices- none           Lung-   Cough+ with deep breath,  + few rhonchi L back, no wheeze,  dullness-none, rub-none. + no crepitus Chest wall-  Abd-  Br/ Gen/ Rectal- Not done, not indicated Extrem-   No-clubbing, none, atrophy- none, strength- nl for age. + Cool/dusky feet Neuro- grossly intact to observation

## 2021-02-21 ENCOUNTER — Ambulatory Visit (INDEPENDENT_AMBULATORY_CARE_PROVIDER_SITE_OTHER): Payer: Medicare Other | Admitting: Internal Medicine

## 2021-02-21 ENCOUNTER — Other Ambulatory Visit: Payer: Self-pay

## 2021-02-21 ENCOUNTER — Encounter: Payer: Self-pay | Admitting: Internal Medicine

## 2021-02-21 DIAGNOSIS — M25512 Pain in left shoulder: Secondary | ICD-10-CM | POA: Diagnosis not present

## 2021-02-21 DIAGNOSIS — I701 Atherosclerosis of renal artery: Secondary | ICD-10-CM | POA: Diagnosis not present

## 2021-02-21 DIAGNOSIS — M79606 Pain in leg, unspecified: Secondary | ICD-10-CM

## 2021-02-21 DIAGNOSIS — M545 Low back pain, unspecified: Secondary | ICD-10-CM | POA: Diagnosis not present

## 2021-02-21 DIAGNOSIS — J479 Bronchiectasis, uncomplicated: Secondary | ICD-10-CM

## 2021-02-21 MED ORDER — ACETAMINOPHEN-CODEINE #2 300-15 MG PO TABS: ORAL_TABLET | ORAL | 1 refills | Status: DC

## 2021-02-21 MED ORDER — BENZONATATE 200 MG PO CAPS: 200.0000 mg | ORAL_CAPSULE | Freq: Three times a day (TID) | ORAL | 5 refills | Status: DC | PRN

## 2021-02-21 MED ORDER — AMOXICILLIN-POT CLAVULANATE 500-125 MG PO TABS: ORAL_TABLET | ORAL | 0 refills | Status: DC

## 2021-02-21 NOTE — Patient Instructions (Signed)
Script sent for Tylenol# 2 (Tylenol with codeine) to see if that can help your cough, since drug store can't get you codeine cough syrups. Too much tylenol can hurt your liver, so don't take more than you really need.  Script sent refilling tessalon perles  Script sent for augmentin antibiotic to take if cough gets worse and you feel as if you might be getting pneumonia again.  If someone can find you some Zero Sugar Marolyn Hammock Rancher hard candies ([facility] and [employer] usually have them),  Using these as throat lozenges can help cough.

## 2021-02-22 ENCOUNTER — Telehealth: Payer: Self-pay | Admitting: Cardiology

## 2021-02-22 NOTE — Telephone Encounter (Signed)
Patient called stating she is having an irregular heart beat. She went to check her BP and the machine told her that her heart beat was irregular.  She stated that's the first time her BP machine has told her that.

## 2021-02-22 NOTE — Telephone Encounter (Signed)
Returned call to patient who states her BP monitor showed that her HR is irregular about 1 hour ago. I asked her to recheck and current BP is 170/100, HR 80. Pt states monitor did not report irregular HR. I asked if she has noticed palpitations, fluttering, irregular HR at other times and she denies. Reports compliance with BP medications. Wore  cardiac monitor 8/21 which showed rare PVCs and PACs, rare SVT, PAT, no a fib, no pauses. Pt denies symptoms of SOB, fatigue. She is due for 1 year follow-up and in light of present symptoms, I scheduled her to see Dr. Marlou Porch on 11/4. Patient verbalized understanding and agreement with plan and confirmed date/time/location of appointment.

## 2021-02-23 ENCOUNTER — Ambulatory Visit (INDEPENDENT_AMBULATORY_CARE_PROVIDER_SITE_OTHER): Payer: Medicare Other | Admitting: Cardiology

## 2021-02-23 ENCOUNTER — Encounter: Payer: Self-pay | Admitting: Cardiology

## 2021-02-23 ENCOUNTER — Other Ambulatory Visit: Payer: Self-pay

## 2021-02-23 DIAGNOSIS — R002 Palpitations: Secondary | ICD-10-CM

## 2021-02-23 DIAGNOSIS — I701 Atherosclerosis of renal artery: Secondary | ICD-10-CM | POA: Diagnosis not present

## 2021-02-23 DIAGNOSIS — J479 Bronchiectasis, uncomplicated: Secondary | ICD-10-CM | POA: Diagnosis not present

## 2021-02-23 DIAGNOSIS — I10 Essential (primary) hypertension: Secondary | ICD-10-CM | POA: Diagnosis not present

## 2021-02-23 NOTE — Assessment & Plan Note (Signed)
Recent hospitalization with community-acquired pneumonia.  Crackles heard left lung base.  Dr. Annamaria Boots has been following.  Recent antibiotic.  Feeling better.

## 2021-02-23 NOTE — Patient Instructions (Signed)
Medication Instructions:  The current medical regimen is effective;  continue present plan and medications.  *If you need a refill on your cardiac medications before your next appointment, please call your pharmacy*  Follow-Up: At [facility], you and your health needs are our priority.  As part of our continuing mission to provide you with exceptional heart care, we have created designated Provider Care Teams.  These Care Teams include your primary Cardiologist (physician) and Advanced Practice Providers (APPs -  Physician Assistants and Nurse Practitioners) who all work together to provide you with the care you need, when you need it.  We recommend signing up for the patient portal called "MyChart".  Sign up information is provided on this After Visit Summary.  MyChart is used to connect with patients for Virtual Visits (Telemedicine).  Patients are able to view lab/test results, encounter notes, upcoming appointments, etc.  Non-urgent messages can be sent to your provider as well.   To learn more about what you can do with MyChart, go to [email or web address].    Your next appointment:   6 month(s)  The format for your next appointment:   In Person  Provider:   NP or PA     Thank you for choosing [facility]!!

## 2021-02-23 NOTE — Assessment & Plan Note (Signed)
Challenging to control.  Previously has had extreme fatigue with clonidine 0.1 mg at bedtime.  She had prior ankle swelling with amlodipine 2.5 mg.  Had fatigue as well with hydralazine and intolerance to Coreg.

## 2021-02-23 NOTE — Progress Notes (Signed)
Cardiology Office Note:    Date:  02/23/2021   ID:  Katie Alvarado, DOB [birth year], MRN [id number]  PCP:  Katie Cruel, MD  [place] Cardiologist:  Katie Furbish, MD  [facility] Electrophysiologist:  None   Referring MD: Katie Cruel, MD   History of Present Illness:    Katie Alvarado is a 87 y.o. female here for the follow-up of bradycardia.  She called the office 02/22/2021 complaining of irregular heart beat per her BP machine. On recheck her BP was 170/100 and heart rate 80 bpm. She denied noticing any palpitations.  Had previously discussed over telephone with Dr. Zadie Alvarado who was covering for Dr. Rex Alvarado at the time.  There was suggestion at that time to perhaps add amlodipine 2.5 mg and increasing clonidine to 0.1 mg twice daily and discontinuing hydralazine.    On 03/28/2020 her blood pressure was 150/90.  Previously was 144/88. Clonidine new. Wants to sleep all of the time. No energy. Now off of Clonidine by Dr. Rex Alvarado. He started Hydralazine as well. 10mg  4 times a day. Can't see she states.  She feels poorly.  Anxious. See below for further details.  Reviewed several blood pressure readings from home.  Sometimes in the 170s.  Her blood pressure cuff at home was slightly higher than blood pressure cuff in the office setting.  I had seen her previously on 02/21/2020 for bradycardia and shortness of breath, also followed by Dr. Annamaria Alvarado with COPD/bronchiectasis.  Has moderate bilateral renal artery stenosis previously seen by Dr. Fletcher Anon.  Previously had also felt a sinking-like sensation but no syncope.  On 10/26/2019 ZIO patch monitor was utilized which showed no adverse arrhythmias.  Rare SVT episodes PAT.  Echocardiogram was reassuring.  Office records from Dr. Rex Alvarado reviewed.  Today: Overall, she appears well. However, she has some left sided chest pain that she attributes to her recent illness. Last month she was in the ED for pneumonia. She completed a second  course of antibiotics after she was found to have recurrent pneumonia at home. At this time she has been recovering well.  At home, she notes that her blood pressure is not always as elevated as it is today.  She is no longer taking metformin.  She denies any palpitations, or shortness of breath. No lightheadedness, headaches, syncope, orthopnea, PND, lower extremity edema or exertional symptoms.   Past Medical History:  Diagnosis Date   Acute nasopharyngitis (common cold)    Arthritis    Bronchiectasis with acute exacerbation (HCC)    Cancer (HCC)    skin cancer   COPD (chronic obstructive pulmonary disease) ([place])    sees dr. Annamaria Alvarado    Cough    Diabetes mellitus    fasting 130-150   Dizziness    Esophageal reflux    History of radiation therapy 10/24/17- 12/05/17   Lower lip, 4.4 Gy X 10 fractions given twice weekly for a total dose of 44 Gy.   Hypertension    Insomnia, unspecified    Other symptoms involving nervous and musculoskeletal systems(781.99)    Pneumonia    hx of   Shortness of breath    exertion    Past Surgical History:  Procedure Laterality Date   ABDOMINAL HYSTERECTOMY     ANKLE FRACTURE SURGERY Left    APPENDECTOMY     BASAL CELL CARCINOMA EXCISION     NOSE   CATARACT EXTRACTION, BILATERAL     OUT PATIENT   COLON SURGERY  colon resection for diverticulitis   IR EPIDUROGRAPHY  05/07/2018   LUMBAR LAMINECTOMY/DECOMPRESSION MICRODISCECTOMY N/A 02/15/2013   Procedure: LUMBAR LAMINECTOMY/DECOMPRESSION MICRODISCECTOMY LUMBAR FOUR-FIVE;  Surgeon: Otilio Connors, MD;  Location: [facility];  Service: Neurosurgery;  Laterality: N/A;   VARICOSE VEIN SURGERY      Current Medications: Current Meds  Medication Sig   acetaminophen (TYLENOL) 325 MG tablet Take 650 mg by mouth every 4 (four) hours as needed for mild pain.   acetaminophen-codeine (TYLENOL #2) 300-15 MG tablet 1 every 6 hours if needed for pain or cough   albuterol (VENTOLIN HFA) 108 (90 Base)  MCG/ACT inhaler INHALE 2 PUFFS INTO THE LUNGS EVERY 6 HOURS AS NEEDED FOR WHEEZING OR SHORTNESS OF BREATH   ALPRAZolam (XANAX) 0.25 MG tablet Take 0.25 mg by mouth 3 (three) times daily as needed for anxiety or sleep.   amoxicillin-clavulanate (AUGMENTIN) 500-125 MG tablet 1 twice daily for infection   aspirin EC 81 MG tablet Take 81 mg by mouth daily. Swallow whole.   benzonatate (TESSALON) 200 MG capsule Take 1 capsule (200 mg total) by mouth 3 (three) times daily as needed for cough.   budesonide (PULMICORT) 0.25 MG/2ML nebulizer solution USE 1 VIAL  IN  NEBULIZER TWICE  DAILY - rinse mouth after treatment   carboxymethylcellulose (REFRESH PLUS) 0.5 % SOLN Place 1 drop into both eyes as needed (dry eyes).   carvedilol (COREG) 6.25 MG tablet Take 6.25 mg by mouth 2 (two) times daily with a meal.   cholecalciferol (VITAMIN D) 1000 UNITS tablet Take 2,000 Units by mouth daily.   Cinnamon 500 MG capsule Take 1,000 mg by mouth 2 (two) times daily as needed (for sugar levels).    clobetasol (TEMOVATE) 0.05 % external solution Apply 1 application topically daily as needed for irritation.   esomeprazole (NEXIUM) 40 MG capsule Take 40 mg by mouth daily.   fluocinolone (SYNALAR) 0.025 % ointment Apply 1 application topically 2 (two) times daily as needed for irritation. Apply Monday-Friday only to scalp, eyebrows, and nasal creases twice daily as needed for irritation.   furosemide (LASIX) 20 MG tablet Take 20 mg by mouth daily.   glipiZIDE (GLUCOTROL) 5 MG tablet Take 5 mg by mouth every morning.   guaiFENesin-codeine 100-10 MG/5ML syrup Take 2.5 mLs by mouth every 6 (six) hours as needed for cough.   hydrALAZINE (APRESOLINE) 25 MG tablet Take 1 tablet (25 mg total) by mouth 3 (three) times daily.   ipratropium (ATROVENT) 0.02 % nebulizer solution USE 1 VIAL IN NEBULIZER 4 TIMES DAILY   irbesartan (AVAPRO) 300 MG tablet TAKE 1 TABLET BY MOUTH EVERY DAY   ketoconazole (NIZORAL) 2 % shampoo Apply 1  application topically See admin instructions. Apply to scalp 2 to 3 times per week.   meclizine (ANTIVERT) 12.5 MG tablet Take 12.5 mg by mouth 3 (three) times daily as needed for dizziness.   metFORMIN (GLUCOPHAGE) 500 MG tablet Take 1,000 mg by mouth 2 (two) times daily.   Multiple Vitamins-Minerals (PRESERVISION AREDS 2 PO) Take 1 tablet by mouth in the morning and at bedtime.   ondansetron (ZOFRAN) 4 MG tablet Take 4 mg by mouth every 6 (six) hours as needed for nausea or vomiting.   ONE TOUCH ULTRA TEST test strip USE TO CHECK BLOOD SUGAR ONCE DAILY 90   ONETOUCH DELICA LANCETS 93Y MISC USE TO CHECK BLOOD SUGAR ONCE DAILY AS DIRECTED   polyvinyl alcohol (LIQUIFILM TEARS) 1.4 % ophthalmic solution Place 1 drop into both  eyes as needed for dry eyes.   Respiratory Therapy Supplies (FLUTTER) DEVI Use as directed   traMADol (ULTRAM) 50 MG tablet Take 0.5 tablets (25 mg total) by mouth every 6 (six) hours as needed for moderate pain.   vitamin E 400 UNIT capsule Take 400 Units by mouth 2 (two) times a week.     Allergies:   Welchol [colesevelam hcl], Amlodipine, Atorvastatin, Coreg [carvedilol], Doxycycline, Glucosamine, Mobic [meloxicam], Sulfa antibiotics, Telmisartan, Tramadol, Celecoxib, and Statins   Social History   Socioeconomic History   Marital status: Widowed    Spouse name: Not on file   Number of children: 2   Years of education: Not on file   Highest education level: Not on file  Occupational History   Not on file  Tobacco Use   Smoking status: Never   Smokeless tobacco: Never  Vaping Use   Vaping Use: Never used  Substance and Sexual Activity   Alcohol use: Yes    Comment: "Hardly Ever"    Drug use: No   Sexual activity: Not Currently    Birth control/protection: Post-menopausal  Other Topics Concern   Not on file  Social History Narrative   Not on file   Social Determinants of Health   Financial Resource Strain: Not on file  Food Insecurity: Not on file   Transportation Needs: Not on file  Physical Activity: Not on file  Stress: Not on file  Social Connections: Not on file     Family History: The patient's family history includes Chronic bronchitis in her father; Other in her brother and sister.  ROS:   Please see the history of present illness.    (+) Left-sided chest pain All other systems reviewed and are negative.  EKGs/Labs/Other Studies Reviewed:    The following studies were reviewed today: As above  CT Chest 01/02/2021: COMPARISON:  01/08/2019   FINDINGS: Cardiovascular: Normal heart size. No pericardial effusion. Extensive atheromatous calcification of coronaries in the aorta.   Mediastinum/Nodes: Negative for adenopathy or mass.   Lungs/Pleura: Patchy bronchiectasis, primarily cystic and varicose. Areas of greatest involvement in the posterior segment right upper lobe, superior segment right lower lobe, diffusely in the left lower lobe, and at the lingula. Severity is greatest at the lingula where there is collapse and cystic involvement, chronic and stable. Bronchiectasis has progressed especially in the superior segment right lower lobe and at the left base. Generalized airway thickening with multiple bronchiectatic air-fluid levels and increased reticulonodular opacity especially at the lingula and in the right lower lobe. Air trapping seen at the left lower lobe. No lobar consolidation.   Upper Abdomen: Negative   Musculoskeletal: Posterior right twelfth rib fracture which is corticated appearing and likely chronic. Minimally covered right eleventh rib fracture which is age indeterminate   IMPRESSION: 1. Increased reticulonodular opacities compatible with bronchitis exacerbation/bronchopneumonia. 2. Background of bronchiectasis which has progressed from 2020. 3. Nonacute appearing right twelfth rib fracture. Partially covered and age-indeterminate right eleventh rib fracture. 4.  Aortic  Atherosclerosis (ICD10-I70.0).  ABI Doppler 10/25/2020: Arterial wall calcification precludes accurate ankle pressures and ABIs.     Summary:  Right: Resting right ankle-brachial index indicates noncompressible right  lower extremity arteries. The right toe-brachial index is abnormal. ABIs  are unreliable. RT great toe pressure = 147 mmHg (mild disease).   Left: Resting left ankle-brachial index indicates noncompressible left  lower extremity arteries. The left toe-brachial index is abnormal. ABIs  are unreliable. LT Great toe pressure = 139 mmHg (mild disease).  Echo 12/24/2019:  1. Left ventricular ejection fraction, by estimation, is 60 to 65%. The  left ventricle has normal function. The left ventricle has no regional  wall motion abnormalities. There is mild left ventricular hypertrophy of  the basal-septal segment. Left  ventricular diastolic parameters are consistent with Grade I diastolic  dysfunction (impaired relaxation).   2. Right ventricular systolic function is normal. The right ventricular  size is normal.   3. The mitral valve is normal in structure. No evidence of mitral valve  regurgitation. No evidence of mitral stenosis.   4. The aortic valve is normal in structure. Aortic valve regurgitation is  not visualized. No aortic stenosis is present.   5. The inferior vena cava is normal in size with greater than 50%  respiratory variability, suggesting right atrial pressure of 3 mmHg.   Monitor 10/26/2019: Sinus rhythm with avg HR of 88 BPM Rare PVC's and PAC's Rare epidoses of SVT, paroxysmal atrial tachycardia, 15 in total, longest lasting 7 beats - benign No atrial fibrillation, no pauses   No indications for pacemaker. Reassuring monitor  EKG:  EKG is personally reviewed and interpreted. 02/23/2021: Sinus rhythm. Rate 75 bpm. 03/28/2020: Sinus rhythm left anterior fascicular block previous EKG  Recent Labs: 01/02/2021: B Natriuretic Peptide 233.1 01/03/2021: ALT  10; Magnesium 1.9 01/04/2021: BUN 18; Creatinine, Ser 0.78; Hemoglobin 10.1; Platelets 433; Potassium 3.2; Sodium 135   Recent Lipid Panel No results found for: CHOL, TRIG, HDL, CHOLHDL, VLDL, LDLCALC, LDLDIRECT   Risk Assessment/Calculations:       Physical Exam:    VS:  BP (!) 160/80 (BP Location: Left Arm, Patient Position: Sitting, Cuff Size: Normal)   Pulse 74   Ht 5\' 8"  (1.727 m)   Wt 126 lb (57.2 kg)   SpO2 94%   BMI 19.16 kg/m     Wt Readings from Last 3 Encounters:  02/23/21 126 lb (57.2 kg)  02/21/21 128 lb (58.1 kg)  01/02/21 127 lb 6.8 oz (57.8 kg)     GEN:  Well nourished, well developed in no acute distress HEENT: Normal NECK: No JVD; No carotid bruits LYMPHATICS: No lymphadenopathy CARDIAC: RRR, no murmurs, rubs, gallops RESPIRATORY:  Crackles at left base of lungs, no wheezing or rhonchi  ABDOMEN: Soft, non-tender, non-distended MUSCULOSKELETAL:  No edema; No deformity  SKIN: Warm and dry NEUROLOGIC:  Alert and oriented x 3 PSYCHIATRIC:  Normal affect   ASSESSMENT:    1. Palpitations   2. Essential hypertension   3. Renal artery stenosis ([place])   4. Bronchiolectasis ([place])     PLAN:    In order of problems listed above:  Palpitations Complained of some irregular heartbeat noted on her blood pressure machine.  Blood pressure was quite elevated at 170/100 yesterday.  It was suggested that she perhaps add amlodipine 2.5 mg and increase clonidine to 0.1 mg twice a day and discontinue hydralazine.  Clonidine has been off and on in the past because of tiredness.  Feels poorly.  Monitor has been recently performed which shows rare atrial tachycardia.  No atrial fibrillation.  No pauses.  Rare PVCs and PACs.  Essential hypertension Challenging to control.  Previously has had extreme fatigue with clonidine 0.1 mg at bedtime.  She had prior ankle swelling with amlodipine 2.5 mg.  Had fatigue as well with hydralazine and intolerance to Coreg.  Renal artery  stenosis (HCC) Previously evaluated by Dr. Fletcher Anon.  Recommended continued medical therapy.  Bronchiolectasis ([place]) Recent hospitalization with community-acquired pneumonia.  Crackles  heard left lung base.  Dr. Annamaria Alvarado has been following.  Recent antibiotic.  Feeling better. Follow-up:  6 months.  Medication Adjustments/Labs and Tests Ordered: Current medicines are reviewed at length with the patient today.  Concerns regarding medicines are outlined above.   Orders Placed This Encounter  Procedures   EKG 12-Lead    No orders of the defined types were placed in this encounter.  Patient Instructions  Medication Instructions:  The current medical regimen is effective;  continue present plan and medications.  *If you need a refill on your cardiac medications before your next appointment, please call your pharmacy*  Follow-Up: At [facility], you and your health needs are our priority.  As part of our continuing mission to provide you with exceptional heart care, we have created designated Provider Care Teams.  These Care Teams include your primary Cardiologist (physician) and Advanced Practice Providers (APPs -  Physician Assistants and Nurse Practitioners) who all work together to provide you with the care you need, when you need it.  We recommend signing up for the patient portal called "MyChart".  Sign up information is provided on this After Visit Summary.  MyChart is used to connect with patients for Virtual Visits (Telemedicine).  Patients are able to view lab/test results, encounter notes, upcoming appointments, etc.  Non-urgent messages can be sent to your provider as well.   To learn more about what you can do with MyChart, go to [email or web address].    Your next appointment:   6 month(s)  The format for your next appointment:   In Person  Provider:   NP or PA     Thank you for choosing [facility]!!     I,Mathew Stumpf,acting as a scribe for Katie Furbish,  MD.,have documented all relevant documentation on the behalf of Katie Furbish, MD,as directed by  Katie Furbish, MD while in the presence of Katie Furbish, MD.  I, Katie Furbish, MD, have reviewed all documentation for this visit. The documentation on 02/23/21 for the exam, diagnosis, procedures, and orders are all accurate and complete.   Signed, Katie Furbish, MD  02/23/2021 5:13 PM    [facility]

## 2021-02-23 NOTE — Assessment & Plan Note (Signed)
Complained of some irregular heartbeat noted on her blood pressure machine.  Blood pressure was quite elevated at 170/100 yesterday.  It was suggested that she perhaps add amlodipine 2.5 mg and increase clonidine to 0.1 mg twice a day and discontinue hydralazine.  Clonidine has been off and on in the past because of tiredness.  Feels poorly.  Monitor has been recently performed which shows rare atrial tachycardia.  No atrial fibrillation.  No pauses.  Rare PVCs and PACs.

## 2021-02-23 NOTE — Assessment & Plan Note (Signed)
Previously evaluated by Dr. Fletcher Anon.  Recommended continued medical therapy.

## 2021-02-27 DIAGNOSIS — I700 Atherosclerosis of aorta: Secondary | ICD-10-CM | POA: Diagnosis not present

## 2021-02-27 DIAGNOSIS — E1140 Type 2 diabetes mellitus with diabetic neuropathy, unspecified: Secondary | ICD-10-CM | POA: Diagnosis not present

## 2021-02-27 DIAGNOSIS — I10 Essential (primary) hypertension: Secondary | ICD-10-CM | POA: Diagnosis not present

## 2021-02-27 DIAGNOSIS — I701 Atherosclerosis of renal artery: Secondary | ICD-10-CM | POA: Diagnosis not present

## 2021-02-27 DIAGNOSIS — J470 Bronchiectasis with acute lower respiratory infection: Secondary | ICD-10-CM | POA: Diagnosis not present

## 2021-02-27 DIAGNOSIS — J180 Bronchopneumonia, unspecified organism: Secondary | ICD-10-CM | POA: Diagnosis not present

## 2021-03-05 DIAGNOSIS — I701 Atherosclerosis of renal artery: Secondary | ICD-10-CM | POA: Diagnosis not present

## 2021-03-05 DIAGNOSIS — J180 Bronchopneumonia, unspecified organism: Secondary | ICD-10-CM | POA: Diagnosis not present

## 2021-03-05 DIAGNOSIS — E1140 Type 2 diabetes mellitus with diabetic neuropathy, unspecified: Secondary | ICD-10-CM | POA: Diagnosis not present

## 2021-03-05 DIAGNOSIS — F419 Anxiety disorder, unspecified: Secondary | ICD-10-CM | POA: Diagnosis not present

## 2021-03-05 DIAGNOSIS — J470 Bronchiectasis with acute lower respiratory infection: Secondary | ICD-10-CM | POA: Diagnosis not present

## 2021-03-05 DIAGNOSIS — I700 Atherosclerosis of aorta: Secondary | ICD-10-CM | POA: Diagnosis not present

## 2021-03-05 DIAGNOSIS — R233 Spontaneous ecchymoses: Secondary | ICD-10-CM | POA: Diagnosis not present

## 2021-03-05 DIAGNOSIS — I10 Essential (primary) hypertension: Secondary | ICD-10-CM | POA: Diagnosis not present

## 2021-03-05 DIAGNOSIS — E1149 Type 2 diabetes mellitus with other diabetic neurological complication: Secondary | ICD-10-CM | POA: Diagnosis not present

## 2021-03-13 DIAGNOSIS — I701 Atherosclerosis of renal artery: Secondary | ICD-10-CM | POA: Diagnosis not present

## 2021-03-13 DIAGNOSIS — E1140 Type 2 diabetes mellitus with diabetic neuropathy, unspecified: Secondary | ICD-10-CM | POA: Diagnosis not present

## 2021-03-13 DIAGNOSIS — J470 Bronchiectasis with acute lower respiratory infection: Secondary | ICD-10-CM | POA: Diagnosis not present

## 2021-03-13 DIAGNOSIS — I700 Atherosclerosis of aorta: Secondary | ICD-10-CM | POA: Diagnosis not present

## 2021-03-13 DIAGNOSIS — J180 Bronchopneumonia, unspecified organism: Secondary | ICD-10-CM | POA: Diagnosis not present

## 2021-03-13 DIAGNOSIS — I10 Essential (primary) hypertension: Secondary | ICD-10-CM | POA: Diagnosis not present

## 2021-03-17 DIAGNOSIS — Z8701 Personal history of pneumonia (recurrent): Secondary | ICD-10-CM | POA: Diagnosis not present

## 2021-03-17 DIAGNOSIS — D649 Anemia, unspecified: Secondary | ICD-10-CM | POA: Diagnosis not present

## 2021-03-17 DIAGNOSIS — J479 Bronchiectasis, uncomplicated: Secondary | ICD-10-CM | POA: Diagnosis not present

## 2021-03-17 DIAGNOSIS — I701 Atherosclerosis of renal artery: Secondary | ICD-10-CM | POA: Diagnosis not present

## 2021-03-17 DIAGNOSIS — H3530 Unspecified macular degeneration: Secondary | ICD-10-CM | POA: Diagnosis not present

## 2021-03-17 DIAGNOSIS — B379 Candidiasis, unspecified: Secondary | ICD-10-CM | POA: Diagnosis not present

## 2021-03-17 DIAGNOSIS — H548 Legal blindness, as defined in USA: Secondary | ICD-10-CM | POA: Diagnosis not present

## 2021-03-17 DIAGNOSIS — I10 Essential (primary) hypertension: Secondary | ICD-10-CM | POA: Diagnosis not present

## 2021-03-17 DIAGNOSIS — I700 Atherosclerosis of aorta: Secondary | ICD-10-CM | POA: Diagnosis not present

## 2021-03-17 DIAGNOSIS — E1140 Type 2 diabetes mellitus with diabetic neuropathy, unspecified: Secondary | ICD-10-CM | POA: Diagnosis not present

## 2021-03-17 DIAGNOSIS — Z7984 Long term (current) use of oral hypoglycemic drugs: Secondary | ICD-10-CM | POA: Diagnosis not present

## 2021-03-19 ENCOUNTER — Ambulatory Visit: Payer: Medicare Other | Admitting: Internal Medicine

## 2021-03-23 DIAGNOSIS — I10 Essential (primary) hypertension: Secondary | ICD-10-CM | POA: Diagnosis not present

## 2021-03-23 DIAGNOSIS — I701 Atherosclerosis of renal artery: Secondary | ICD-10-CM | POA: Diagnosis not present

## 2021-03-23 DIAGNOSIS — B379 Candidiasis, unspecified: Secondary | ICD-10-CM | POA: Diagnosis not present

## 2021-03-23 DIAGNOSIS — E1140 Type 2 diabetes mellitus with diabetic neuropathy, unspecified: Secondary | ICD-10-CM | POA: Diagnosis not present

## 2021-03-23 DIAGNOSIS — I700 Atherosclerosis of aorta: Secondary | ICD-10-CM | POA: Diagnosis not present

## 2021-03-23 DIAGNOSIS — J479 Bronchiectasis, uncomplicated: Secondary | ICD-10-CM | POA: Diagnosis not present

## 2021-03-28 DIAGNOSIS — E1140 Type 2 diabetes mellitus with diabetic neuropathy, unspecified: Secondary | ICD-10-CM | POA: Diagnosis not present

## 2021-03-28 DIAGNOSIS — J479 Bronchiectasis, uncomplicated: Secondary | ICD-10-CM | POA: Diagnosis not present

## 2021-03-28 DIAGNOSIS — I701 Atherosclerosis of renal artery: Secondary | ICD-10-CM | POA: Diagnosis not present

## 2021-03-28 DIAGNOSIS — I10 Essential (primary) hypertension: Secondary | ICD-10-CM | POA: Diagnosis not present

## 2021-03-28 DIAGNOSIS — I700 Atherosclerosis of aorta: Secondary | ICD-10-CM | POA: Diagnosis not present

## 2021-03-28 DIAGNOSIS — B379 Candidiasis, unspecified: Secondary | ICD-10-CM | POA: Diagnosis not present

## 2021-03-29 ENCOUNTER — Other Ambulatory Visit: Payer: Self-pay | Admitting: Cardiology

## 2021-03-29 DIAGNOSIS — I10 Essential (primary) hypertension: Secondary | ICD-10-CM

## 2021-04-03 DIAGNOSIS — I700 Atherosclerosis of aorta: Secondary | ICD-10-CM | POA: Diagnosis not present

## 2021-04-03 DIAGNOSIS — I701 Atherosclerosis of renal artery: Secondary | ICD-10-CM | POA: Diagnosis not present

## 2021-04-03 DIAGNOSIS — I10 Essential (primary) hypertension: Secondary | ICD-10-CM | POA: Diagnosis not present

## 2021-04-03 DIAGNOSIS — B379 Candidiasis, unspecified: Secondary | ICD-10-CM | POA: Diagnosis not present

## 2021-04-03 DIAGNOSIS — J479 Bronchiectasis, uncomplicated: Secondary | ICD-10-CM | POA: Diagnosis not present

## 2021-04-03 DIAGNOSIS — E1140 Type 2 diabetes mellitus with diabetic neuropathy, unspecified: Secondary | ICD-10-CM | POA: Diagnosis not present

## 2021-04-09 DIAGNOSIS — J479 Bronchiectasis, uncomplicated: Secondary | ICD-10-CM | POA: Diagnosis not present

## 2021-04-09 DIAGNOSIS — I701 Atherosclerosis of renal artery: Secondary | ICD-10-CM | POA: Diagnosis not present

## 2021-04-09 DIAGNOSIS — I10 Essential (primary) hypertension: Secondary | ICD-10-CM | POA: Diagnosis not present

## 2021-04-09 DIAGNOSIS — B379 Candidiasis, unspecified: Secondary | ICD-10-CM | POA: Diagnosis not present

## 2021-04-09 DIAGNOSIS — I700 Atherosclerosis of aorta: Secondary | ICD-10-CM | POA: Diagnosis not present

## 2021-04-09 DIAGNOSIS — E1140 Type 2 diabetes mellitus with diabetic neuropathy, unspecified: Secondary | ICD-10-CM | POA: Diagnosis not present

## 2021-04-16 DIAGNOSIS — H548 Legal blindness, as defined in USA: Secondary | ICD-10-CM | POA: Diagnosis not present

## 2021-04-16 DIAGNOSIS — B379 Candidiasis, unspecified: Secondary | ICD-10-CM | POA: Diagnosis not present

## 2021-04-16 DIAGNOSIS — I701 Atherosclerosis of renal artery: Secondary | ICD-10-CM | POA: Diagnosis not present

## 2021-04-16 DIAGNOSIS — Z8701 Personal history of pneumonia (recurrent): Secondary | ICD-10-CM | POA: Diagnosis not present

## 2021-04-16 DIAGNOSIS — E1140 Type 2 diabetes mellitus with diabetic neuropathy, unspecified: Secondary | ICD-10-CM | POA: Diagnosis not present

## 2021-04-16 DIAGNOSIS — J479 Bronchiectasis, uncomplicated: Secondary | ICD-10-CM | POA: Diagnosis not present

## 2021-04-16 DIAGNOSIS — D649 Anemia, unspecified: Secondary | ICD-10-CM | POA: Diagnosis not present

## 2021-04-16 DIAGNOSIS — H3530 Unspecified macular degeneration: Secondary | ICD-10-CM | POA: Diagnosis not present

## 2021-04-16 DIAGNOSIS — Z7984 Long term (current) use of oral hypoglycemic drugs: Secondary | ICD-10-CM | POA: Diagnosis not present

## 2021-04-16 DIAGNOSIS — I700 Atherosclerosis of aorta: Secondary | ICD-10-CM | POA: Diagnosis not present

## 2021-04-16 DIAGNOSIS — I10 Essential (primary) hypertension: Secondary | ICD-10-CM | POA: Diagnosis not present

## 2021-04-18 DIAGNOSIS — I700 Atherosclerosis of aorta: Secondary | ICD-10-CM | POA: Diagnosis not present

## 2021-04-18 DIAGNOSIS — I10 Essential (primary) hypertension: Secondary | ICD-10-CM | POA: Diagnosis not present

## 2021-04-18 DIAGNOSIS — B379 Candidiasis, unspecified: Secondary | ICD-10-CM | POA: Diagnosis not present

## 2021-04-18 DIAGNOSIS — I701 Atherosclerosis of renal artery: Secondary | ICD-10-CM | POA: Diagnosis not present

## 2021-04-18 DIAGNOSIS — J479 Bronchiectasis, uncomplicated: Secondary | ICD-10-CM | POA: Diagnosis not present

## 2021-04-18 DIAGNOSIS — E1140 Type 2 diabetes mellitus with diabetic neuropathy, unspecified: Secondary | ICD-10-CM | POA: Diagnosis not present

## 2021-04-23 ENCOUNTER — Other Ambulatory Visit: Payer: Self-pay

## 2021-04-23 ENCOUNTER — Inpatient Hospital Stay (HOSPITAL_BASED_OUTPATIENT_CLINIC_OR_DEPARTMENT_OTHER)
Admission: EM | Admit: 2021-04-23 | Discharge: 2021-04-25 | DRG: 178 | Disposition: A | Discharge status: Home / Self Care | Payer: Medicare Other | Attending: Internal Medicine | Admitting: Internal Medicine

## 2021-04-23 ENCOUNTER — Emergency Department (HOSPITAL_BASED_OUTPATIENT_CLINIC_OR_DEPARTMENT_OTHER): Payer: Medicare Other

## 2021-04-23 ENCOUNTER — Encounter (HOSPITAL_BASED_OUTPATIENT_CLINIC_OR_DEPARTMENT_OTHER): Payer: Self-pay | Admitting: Emergency Medicine

## 2021-04-23 DIAGNOSIS — R531 Weakness: Secondary | ICD-10-CM

## 2021-04-23 DIAGNOSIS — Z9049 Acquired absence of other specified parts of digestive tract: Secondary | ICD-10-CM

## 2021-04-23 DIAGNOSIS — Z90710 Acquired absence of both cervix and uterus: Secondary | ICD-10-CM

## 2021-04-23 DIAGNOSIS — Z923 Personal history of irradiation: Secondary | ICD-10-CM

## 2021-04-23 DIAGNOSIS — Z7982 Long term (current) use of aspirin: Secondary | ICD-10-CM

## 2021-04-23 DIAGNOSIS — I110 Hypertensive heart disease with heart failure: Secondary | ICD-10-CM | POA: Diagnosis present

## 2021-04-23 DIAGNOSIS — Z7984 Long term (current) use of oral hypoglycemic drugs: Secondary | ICD-10-CM | POA: Diagnosis not present

## 2021-04-23 DIAGNOSIS — J479 Bronchiectasis, uncomplicated: Secondary | ICD-10-CM

## 2021-04-23 DIAGNOSIS — H547 Unspecified visual loss: Secondary | ICD-10-CM | POA: Diagnosis present

## 2021-04-23 DIAGNOSIS — Z66 Do not resuscitate: Secondary | ICD-10-CM | POA: Diagnosis not present

## 2021-04-23 DIAGNOSIS — E1165 Type 2 diabetes mellitus with hyperglycemia: Secondary | ICD-10-CM | POA: Diagnosis present

## 2021-04-23 DIAGNOSIS — Z825 Family history of asthma and other chronic lower respiratory diseases: Secondary | ICD-10-CM | POA: Diagnosis not present

## 2021-04-23 DIAGNOSIS — M1990 Unspecified osteoarthritis, unspecified site: Secondary | ICD-10-CM | POA: Diagnosis present

## 2021-04-23 DIAGNOSIS — U071 COVID-19: Secondary | ICD-10-CM | POA: Diagnosis not present

## 2021-04-23 DIAGNOSIS — Z7951 Long term (current) use of inhaled steroids: Secondary | ICD-10-CM | POA: Diagnosis not present

## 2021-04-23 DIAGNOSIS — I10 Essential (primary) hypertension: Secondary | ICD-10-CM | POA: Diagnosis not present

## 2021-04-23 DIAGNOSIS — K219 Gastro-esophageal reflux disease without esophagitis: Secondary | ICD-10-CM | POA: Diagnosis present

## 2021-04-23 DIAGNOSIS — Z882 Allergy status to sulfonamides status: Secondary | ICD-10-CM

## 2021-04-23 DIAGNOSIS — I951 Orthostatic hypotension: Secondary | ICD-10-CM | POA: Diagnosis present

## 2021-04-23 DIAGNOSIS — E871 Hypo-osmolality and hyponatremia: Secondary | ICD-10-CM | POA: Diagnosis not present

## 2021-04-23 DIAGNOSIS — Z85828 Personal history of other malignant neoplasm of skin: Secondary | ICD-10-CM

## 2021-04-23 DIAGNOSIS — Z885 Allergy status to narcotic agent status: Secondary | ICD-10-CM | POA: Diagnosis not present

## 2021-04-23 DIAGNOSIS — E860 Dehydration: Secondary | ICD-10-CM | POA: Diagnosis not present

## 2021-04-23 DIAGNOSIS — Z881 Allergy status to other antibiotic agents status: Secondary | ICD-10-CM

## 2021-04-23 DIAGNOSIS — R519 Headache, unspecified: Secondary | ICD-10-CM | POA: Diagnosis not present

## 2021-04-23 DIAGNOSIS — I5032 Chronic diastolic (congestive) heart failure: Secondary | ICD-10-CM | POA: Diagnosis present

## 2021-04-23 DIAGNOSIS — Z886 Allergy status to analgesic agent status: Secondary | ICD-10-CM

## 2021-04-23 DIAGNOSIS — H3530 Unspecified macular degeneration: Secondary | ICD-10-CM | POA: Diagnosis present

## 2021-04-23 DIAGNOSIS — R059 Cough, unspecified: Secondary | ICD-10-CM | POA: Diagnosis not present

## 2021-04-23 DIAGNOSIS — Z888 Allergy status to other drugs, medicaments and biological substances status: Secondary | ICD-10-CM

## 2021-04-23 LAB — BASIC METABOLIC PANEL
Anion gap: 7 (ref 5–15)
BUN: 12 mg/dL (ref 8–23)
CO2: 31 mmol/L (ref 22–32)
Calcium: 9 mg/dL (ref 8.9–10.3)
Chloride: 94 mmol/L — ABNORMAL LOW (ref 98–111)
Creatinine, Ser: 0.76 mg/dL (ref 0.44–1.00)
GFR, Estimated: >60 mL/min (ref >60)
Glucose, Bld: 112 mg/dL — ABNORMAL HIGH (ref 70–99)
Potassium: 3.9 mmol/L (ref 3.5–5.1)
Sodium: 132 mmol/L — ABNORMAL LOW (ref 135–145)

## 2021-04-23 LAB — CBC WITH DIFFERENTIAL/PLATELET
Abs Immature Granulocytes: 0.02 K/uL (ref 0.00–0.07)
Basophils Absolute: 0 K/uL (ref 0.0–0.1)
Basophils Relative: 1 %
Eosinophils Absolute: 0 K/uL (ref 0.0–0.5)
Eosinophils Relative: 0 %
HCT: 32.5 % — ABNORMAL LOW (ref 36.0–46.0)
Hemoglobin: 10.3 g/dL — ABNORMAL LOW (ref 12.0–15.0)
Immature Granulocytes: 0 %
Lymphocytes Relative: 19 %
Lymphs Abs: 0.9 K/uL (ref 0.7–4.0)
MCH: 27.7 pg (ref 26.0–34.0)
MCHC: 31.7 g/dL (ref 30.0–36.0)
MCV: 87.4 fL (ref 80.0–100.0)
Monocytes Absolute: 1.2 K/uL — ABNORMAL HIGH (ref 0.1–1.0)
Monocytes Relative: 24 %
Neutro Abs: 2.7 K/uL (ref 1.7–7.7)
Neutrophils Relative %: 56 %
Platelets: 248 K/uL (ref 150–400)
RBC: 3.72 MIL/uL — ABNORMAL LOW (ref 3.87–5.11)
RDW: 13.9 % (ref 11.5–15.5)
WBC: 4.8 K/uL (ref 4.0–10.5)
nRBC: 0 % (ref 0.0–0.2)

## 2021-04-23 LAB — RESP PANEL BY RT-PCR (FLU A&B, COVID) ARPGX2
Influenza A by PCR: NEGATIVE
Influenza B by PCR: NEGATIVE
SARS Coronavirus 2 by RT PCR: POSITIVE — AB

## 2021-04-23 LAB — MAGNESIUM: Magnesium: 1.8 mg/dL (ref 1.7–2.4)

## 2021-04-23 LAB — TROPONIN I (HIGH SENSITIVITY)
Troponin I (High Sensitivity): 6 ng/L
Troponin I (High Sensitivity): 5 ng/L

## 2021-04-23 LAB — TSH: TSH: 1.627 u[IU]/mL (ref 0.350–4.500)

## 2021-04-23 MED ORDER — ONDANSETRON HCL 4 MG/2ML IJ SOLN
4.0000 mg | Freq: Once | INTRAMUSCULAR | Status: AC
  Administered 2021-04-23: 4 mg via INTRAVENOUS
  Filled 2021-04-23: qty 2

## 2021-04-23 MED ORDER — FENTANYL CITRATE PF 50 MCG/ML IJ SOSY
50.0000 ug | PREFILLED_SYRINGE | Freq: Once | INTRAMUSCULAR | Status: AC
  Administered 2021-04-23: 50 ug via INTRAVENOUS
  Filled 2021-04-23: qty 1

## 2021-04-23 MED ORDER — HYDROCODONE-ACETAMINOPHEN 5-325 MG PO TABS
1.0000 | ORAL_TABLET | Freq: Once | ORAL | Status: AC
  Administered 2021-04-23: 1 via ORAL
  Filled 2021-04-23: qty 1

## 2021-04-23 MED ORDER — ACETAMINOPHEN 325 MG PO TABS
650.0000 mg | ORAL_TABLET | Freq: Once | ORAL | Status: AC
  Administered 2021-04-23: 650 mg via ORAL
  Filled 2021-04-23: qty 2

## 2021-04-23 MED ORDER — LACTATED RINGERS IV BOLUS
500.0000 mL | Freq: Once | INTRAVENOUS | Status: AC
  Administered 2021-04-23: 500 mL via INTRAVENOUS

## 2021-04-23 MED ORDER — LIDOCAINE 5 % EX PTCH
1.0000 | MEDICATED_PATCH | CUTANEOUS | Status: DC
  Administered 2021-04-23 – 2021-04-25 (×3): 1 via TRANSDERMAL
  Filled 2021-04-23 (×3): qty 1

## 2021-04-23 MED ORDER — IPRATROPIUM BROMIDE 0.02 % IN SOLN
0.5000 mg | Freq: Once | RESPIRATORY_TRACT | Status: AC
  Administered 2021-04-23: 0.5 mg via RESPIRATORY_TRACT
  Filled 2021-04-23: qty 2.5

## 2021-04-23 NOTE — ED Notes (Signed)
Care Handoff/Report given to Candace, RN at this Time.

## 2021-04-23 NOTE — ED Provider Notes (Signed)
[facility] EMERGENCY DEPT Provider Note   CSN: [id number] Arrival date & time: 04/23/21  7322     History  Chief Complaint  Patient presents with   Generalized Body Aches    Katie Alvarado is a 88 y.o. female.  HPI  88 year old female with past medical history including GERD, HTN, DM2, COPD, HTN who presents to the emergency department with a chief complaint of generalized weakness, fatigue, decreased oral intake, headaches and a cough.  The patient tested positive via home COVID-19 testing.  She has had decreased oral intake and feels dehydrated. Home Medications Prior to Admission medications   Medication Sig Start Date End Date Taking? Authorizing Provider  acetaminophen (TYLENOL) 325 MG tablet Take 650 mg by mouth every 4 (four) hours as needed for mild pain.    [provider]  acetaminophen-codeine (TYLENOL #2) 300-15 MG tablet 1 every 6 hours if needed for pain or cough 02/21/21   Baird Lyons D, MD  albuterol (VENTOLIN HFA) 108 (90 Base) MCG/ACT inhaler INHALE 2 PUFFS INTO THE LUNGS EVERY 6 HOURS AS NEEDED FOR WHEEZING OR SHORTNESS OF BREATH 08/17/20   Young, Kasandra Knudsen, MD  ALPRAZolam Duanne Moron) 0.25 MG tablet Take 0.25 mg by mouth 3 (three) times daily as needed for anxiety or sleep.    [provider]  amoxicillin-clavulanate (AUGMENTIN) 500-125 MG tablet 1 twice daily for infection 02/21/21   Baird Lyons D, MD  aspirin EC 81 MG tablet Take 81 mg by mouth daily. Swallow whole.    [provider]  benzonatate (TESSALON) 200 MG capsule Take 1 capsule (200 mg total) by mouth 3 (three) times daily as needed for cough. 02/21/21   Baird Lyons D, MD  budesonide (PULMICORT) 0.25 MG/2ML nebulizer solution USE 1 VIAL  IN  NEBULIZER TWICE  DAILY - rinse mouth after treatment 06/30/20   Baird Lyons D, MD  carboxymethylcellulose (REFRESH PLUS) 0.5 % SOLN Place 1 drop into both eyes as needed (dry eyes).    [provider]  carvedilol  (COREG) 6.25 MG tablet Take 6.25 mg by mouth 2 (two) times daily with a meal.    [provider]  cholecalciferol (VITAMIN D) 1000 UNITS tablet Take 2,000 Units by mouth daily.    [provider]  Cinnamon 500 MG capsule Take 1,000 mg by mouth 2 (two) times daily as needed (for sugar levels).     [provider]  clobetasol (TEMOVATE) 0.05 % external solution Apply 1 application topically daily as needed for irritation. 09/12/20   [provider]  esomeprazole (NEXIUM) 40 MG capsule Take 40 mg by mouth daily.    [provider]  fluocinolone (SYNALAR) 0.025 % ointment Apply 1 application topically 2 (two) times daily as needed for irritation. Apply Monday-Friday only to scalp, eyebrows, and nasal creases twice daily as needed for irritation. 09/26/20   [provider]  furosemide (LASIX) 20 MG tablet Take 20 mg by mouth daily.    [provider]  glipiZIDE (GLUCOTROL) 5 MG tablet Take 5 mg by mouth every morning. 02/14/21   [provider]  guaiFENesin-codeine 100-10 MG/5ML syrup Take 2.5 mLs by mouth every 6 (six) hours as needed for cough. 01/05/21   Caren Griffins, MD  hydrALAZINE (APRESOLINE) 25 MG tablet Take 1 tablet (25 mg total) by mouth 3 (three) times daily. 01/05/21 01/05/22  Caren Griffins, MD  ipratropium (ATROVENT) 0.02 % nebulizer solution USE 1 VIAL IN NEBULIZER 4 TIMES DAILY 06/30/20  Baird Lyons D, MD  irbesartan (AVAPRO) 300 MG tablet TAKE 1 TABLET BY MOUTH EVERY DAY 03/29/21   Jerline Pain, MD  ketoconazole (NIZORAL) 2 % shampoo Apply 1 application topically See admin instructions. Apply to scalp 2 to 3 times per week. 09/25/20   [provider]  meclizine (ANTIVERT) 12.5 MG tablet Take 12.5 mg by mouth 3 (three) times daily as needed for dizziness.    [provider]  metFORMIN (GLUCOPHAGE) 500 MG tablet Take 1,000 mg by mouth 2 (two) times daily.    [provider]  Multiple  Vitamins-Minerals (PRESERVISION AREDS 2 PO) Take 1 tablet by mouth in the morning and at bedtime.    [provider]  ondansetron (ZOFRAN) 4 MG tablet Take 4 mg by mouth every 6 (six) hours as needed for nausea or vomiting. 10/11/20   [provider]  ONE TOUCH ULTRA TEST test strip USE TO CHECK BLOOD SUGAR ONCE DAILY 90 01/04/16   [provider]  ONETOUCH DELICA LANCETS 09B MISC USE TO CHECK BLOOD SUGAR ONCE DAILY AS DIRECTED 01/04/16   [provider]  polyvinyl alcohol (LIQUIFILM TEARS) 1.4 % ophthalmic solution Place 1 drop into both eyes as needed for dry eyes.    [provider]  Respiratory Therapy Supplies (FLUTTER) DEVI Use as directed 11/03/15   Deneise Lever, MD  traMADol (ULTRAM) 50 MG tablet Take 0.5 tablets (25 mg total) by mouth every 6 (six) hours as needed for moderate pain. 05/08/18   Georgette Shell, MD  vitamin E 400 UNIT capsule Take 400 Units by mouth 2 (two) times a week.    [provider]      Allergies    Roanna Banning hcl], Amlodipine, Atorvastatin, Coreg [carvedilol], Doxycycline, Glucosamine, Mobic [meloxicam], Sulfa antibiotics, Telmisartan, Tramadol, Celecoxib, and Statins    Review of Systems   Review of Systems  Constitutional:  Positive for appetite change and fatigue.  HENT:  Positive for sore throat.   Respiratory:  Positive for cough.   Musculoskeletal:  Positive for myalgias.  Neurological:  Positive for weakness and headaches.  All other systems reviewed and are negative.  Physical Exam Updated Vital Signs BP (!) 175/70 (BP Location: Right Arm)    Pulse 74    Temp 97.9 F (36.6 C)    Resp 20    Ht 5\' 8"  (1.727 m)    Wt 55.3 kg    SpO2 100%    BMI 18.55 kg/m  Physical Exam Vitals and nursing note reviewed.  Constitutional:      General: She is not in acute distress.    Appearance: She is well-developed.  HENT:     Head: Normocephalic and atraumatic.     Mouth/Throat:     Mouth:  Mucous membranes are dry.  Eyes:     Conjunctiva/sclera: Conjunctivae normal.  Cardiovascular:     Rate and Rhythm: Normal rate and regular rhythm.     Heart sounds: No murmur heard. Pulmonary:     Effort: Pulmonary effort is normal. No respiratory distress.     Breath sounds: Normal breath sounds.  Abdominal:     Palpations: Abdomen is soft.     Tenderness: There is no abdominal tenderness.  Musculoskeletal:        General: No swelling.     Cervical back: Neck supple.  Skin:    General: Skin is warm and dry.     Capillary Refill: Capillary refill takes less than 2 seconds.  Neurological:  Mental Status: She is alert.  Psychiatric:        Mood and Affect: Mood normal.    ED Results / Procedures / Treatments   Labs (all labs ordered are listed, but only abnormal results are displayed) Labs Reviewed  RESP PANEL BY RT-PCR (FLU A&B, COVID) ARPGX2 - Abnormal; Notable for the following components:      Result Value   SARS Coronavirus 2 by RT PCR POSITIVE (*)    All other components within normal limits  CBC WITH DIFFERENTIAL/PLATELET - Abnormal; Notable for the following components:   RBC 3.72 (*)    Hemoglobin 10.3 (*)    HCT 32.5 (*)    Monocytes Absolute 1.2 (*)    All other components within normal limits  BASIC METABOLIC PANEL - Abnormal; Notable for the following components:   Sodium 132 (*)    Chloride 94 (*)    Glucose, Bld 112 (*)    All other components within normal limits  TSH  MAGNESIUM  TROPONIN I (HIGH SENSITIVITY)  TROPONIN I (HIGH SENSITIVITY)    EKG None  Radiology DG Chest Portable 1 View  Result Date: 04/23/2021 CLINICAL DATA:  Weakness. Positive for COVID-19. Headaches, body aches and cough. EXAM: PORTABLE CHEST 1 VIEW COMPARISON:  Prior exams, most recent dated 01/01/2021. FINDINGS: Cardiac silhouette is normal in size. No mediastinal or hilar masses. Coarse irregular interstitial opacities are noted, most evident in the left mid to lower lung,  without change from the prior exam. No lung consolidation. No pleural effusion or pneumothorax. Skeletal structures are grossly intact. IMPRESSION: 1. No convincing acute cardiopulmonary disease. 2. Chronic lung findings that reflect bronchiectasis, mucous plugging and interstitial thickening that are without change, better assessed on the CT dated 01/02/2021. Electronically Signed   By: Lajean Manes M.D.   On: 04/23/2021 10:40    Procedures Procedures    Medications Ordered in ED Medications  lidocaine (LIDODERM) 5 % 1 patch (1 patch Transdermal Patch Applied 04/23/21 1634)  acetaminophen (TYLENOL) tablet 650 mg (650 mg Oral Given 04/23/21 1123)  lactated ringers bolus 500 mL (0 mLs Intravenous Stopped 04/23/21 1158)  HYDROcodone-acetaminophen (NORCO/VICODIN) 5-325 MG per tablet 1 tablet (1 tablet Oral Given 04/23/21 1249)    ED Course/ Medical Decision Making/ A&P Clinical Course as of 04/23/21 1639  Mon Apr 23, 2021  1227 SARS Coronavirus 2 by RT PCR(!): POSITIVE [JL]    Clinical Course User Index [JL] Regan Lemming, MD                           Medical Decision Making  88 year old female with past medical history including GERD, HTN, DM2, COPD, HTN who presents to the emergency department with a chief complaint of generalized weakness, fatigue, decreased oral intake, headaches and a cough.  The patient tested positive via home COVID-19 testing.  She has had decreased oral intake and feels dehydrated.  On arrival, the patient was afebrile, hemodynamically stable, intermittently mildly tachypneic, saturating between 94 and 100% on room air.  The patient had a physical exam significant for dry mucous membranes which in the setting of poor oral intake is concerning for dehydration in the setting of the patient's COVID-19.  COVID-19 and influenza PCR testing was collected and confirmed COVID infection.  Screening laboratory work-up was performed which revealed a normal TSH, troponins normal, BMP  with mild hyponatremia to 132, otherwise generally unremarkable, magnesium normal at 1.8, CBC without a leukocytosis, stable anemia at  10.3.  The patient was volume resuscitated with a 500 cc LR bolus.  A chest x-ray was performed and revealed no evidence of focal consolidation to suggest bacterial pneumonia.  She was administered Tylenol for myalgias and subsequently administered Norco due to persistent pain and muscle aches and discomfort.  Given the concern for significantly decreased oral intake in the setting of her COVID-19 infection, will plan for admission for observation for IV fluid resuscitation.  Hospitalist medicine consulted for admission and the patient was accepted in admission for observation.      Final Clinical Impression(s) / ED Diagnoses Final diagnoses:  COVID-19  Dehydration    Rx / DC Orders ED Discharge Orders     None         Regan Lemming, MD 04/23/21 [phone]

## 2021-04-23 NOTE — ED Triage Notes (Signed)
COVID+, c/o gen weakness, headaches, body aches, cough

## 2021-04-23 NOTE — ED Notes (Signed)
Care Handoff/Report given to [facility] at this Time.

## 2021-04-24 DIAGNOSIS — Z825 Family history of asthma and other chronic lower respiratory diseases: Secondary | ICD-10-CM | POA: Diagnosis not present

## 2021-04-24 DIAGNOSIS — I5032 Chronic diastolic (congestive) heart failure: Secondary | ICD-10-CM | POA: Diagnosis present

## 2021-04-24 DIAGNOSIS — Z7984 Long term (current) use of oral hypoglycemic drugs: Secondary | ICD-10-CM | POA: Diagnosis not present

## 2021-04-24 DIAGNOSIS — Z85828 Personal history of other malignant neoplasm of skin: Secondary | ICD-10-CM | POA: Diagnosis not present

## 2021-04-24 DIAGNOSIS — Z90710 Acquired absence of both cervix and uterus: Secondary | ICD-10-CM | POA: Diagnosis not present

## 2021-04-24 DIAGNOSIS — Z923 Personal history of irradiation: Secondary | ICD-10-CM | POA: Diagnosis not present

## 2021-04-24 DIAGNOSIS — Z886 Allergy status to analgesic agent status: Secondary | ICD-10-CM | POA: Diagnosis not present

## 2021-04-24 DIAGNOSIS — Z7951 Long term (current) use of inhaled steroids: Secondary | ICD-10-CM | POA: Diagnosis not present

## 2021-04-24 DIAGNOSIS — K219 Gastro-esophageal reflux disease without esophagitis: Secondary | ICD-10-CM | POA: Diagnosis present

## 2021-04-24 DIAGNOSIS — M1990 Unspecified osteoarthritis, unspecified site: Secondary | ICD-10-CM | POA: Diagnosis present

## 2021-04-24 DIAGNOSIS — E860 Dehydration: Secondary | ICD-10-CM | POA: Diagnosis present

## 2021-04-24 DIAGNOSIS — E1165 Type 2 diabetes mellitus with hyperglycemia: Secondary | ICD-10-CM | POA: Diagnosis present

## 2021-04-24 DIAGNOSIS — I951 Orthostatic hypotension: Secondary | ICD-10-CM | POA: Diagnosis present

## 2021-04-24 DIAGNOSIS — Z881 Allergy status to other antibiotic agents status: Secondary | ICD-10-CM | POA: Diagnosis not present

## 2021-04-24 DIAGNOSIS — I110 Hypertensive heart disease with heart failure: Secondary | ICD-10-CM | POA: Diagnosis present

## 2021-04-24 DIAGNOSIS — E871 Hypo-osmolality and hyponatremia: Secondary | ICD-10-CM | POA: Diagnosis present

## 2021-04-24 DIAGNOSIS — Z7982 Long term (current) use of aspirin: Secondary | ICD-10-CM | POA: Diagnosis not present

## 2021-04-24 DIAGNOSIS — I10 Essential (primary) hypertension: Secondary | ICD-10-CM | POA: Diagnosis not present

## 2021-04-24 DIAGNOSIS — Z885 Allergy status to narcotic agent status: Secondary | ICD-10-CM | POA: Diagnosis not present

## 2021-04-24 DIAGNOSIS — U071 COVID-19: Secondary | ICD-10-CM | POA: Diagnosis present

## 2021-04-24 DIAGNOSIS — Z9049 Acquired absence of other specified parts of digestive tract: Secondary | ICD-10-CM | POA: Diagnosis not present

## 2021-04-24 DIAGNOSIS — J479 Bronchiectasis, uncomplicated: Secondary | ICD-10-CM | POA: Diagnosis present

## 2021-04-24 DIAGNOSIS — H3530 Unspecified macular degeneration: Secondary | ICD-10-CM | POA: Diagnosis present

## 2021-04-24 DIAGNOSIS — H547 Unspecified visual loss: Secondary | ICD-10-CM | POA: Diagnosis present

## 2021-04-24 DIAGNOSIS — Z66 Do not resuscitate: Secondary | ICD-10-CM | POA: Diagnosis present

## 2021-04-24 LAB — GLUCOSE, CAPILLARY
Glucose-Capillary: 150 mg/dL — ABNORMAL HIGH (ref 70–99)
Glucose-Capillary: 145 mg/dL — ABNORMAL HIGH (ref 70–99)
Glucose-Capillary: 104 mg/dL — ABNORMAL HIGH (ref 70–99)

## 2021-04-24 MED ORDER — NIRMATRELVIR/RITONAVIR (PAXLOVID)TABLET
3.0000 | ORAL_TABLET | Freq: Two times a day (BID) | ORAL | Status: DC
  Administered 2021-04-24 – 2021-04-25 (×3): 3 via ORAL
  Filled 2021-04-24: qty 30

## 2021-04-24 MED ORDER — LACTATED RINGERS IV SOLN: INTRAVENOUS | Status: DC

## 2021-04-24 MED ORDER — INSULIN ASPART 100 UNIT/ML IJ SOLN: 0.0000 [IU] | Freq: Every day | INTRAMUSCULAR | Status: DC

## 2021-04-24 MED ORDER — ONDANSETRON 4 MG PO TBDP
4.0000 mg | ORAL_TABLET | Freq: Once | ORAL | Status: AC
  Administered 2021-04-24: 4 mg via ORAL
  Filled 2021-04-24: qty 1

## 2021-04-24 MED ORDER — INSULIN ASPART 100 UNIT/ML IJ SOLN
0.0000 [IU] | Freq: Three times a day (TID) | INTRAMUSCULAR | Status: DC
  Administered 2021-04-24 – 2021-04-25 (×4): 1 [IU] via SUBCUTANEOUS

## 2021-04-24 MED ORDER — ALPRAZOLAM 0.25 MG PO TABS
0.2500 mg | ORAL_TABLET | Freq: Once | ORAL | Status: AC
Start: 2021-04-24 — End: 2021-04-24
  Administered 2021-04-24: 0.25 mg via ORAL
  Filled 2021-04-24: qty 1

## 2021-04-24 MED ORDER — FUROSEMIDE 20 MG PO TABS
20.0000 mg | ORAL_TABLET | Freq: Every day | ORAL | Status: DC
  Administered 2021-04-25: 20 mg via ORAL
  Filled 2021-04-24 (×2): qty 1

## 2021-04-24 MED ORDER — IPRATROPIUM BROMIDE 0.02 % IN SOLN: 0.2500 mg | Freq: Four times a day (QID) | RESPIRATORY_TRACT | Status: DC

## 2021-04-24 MED ORDER — TRAMADOL HCL 50 MG PO TABS
25.0000 mg | ORAL_TABLET | Freq: Four times a day (QID) | ORAL | Status: AC | PRN
  Administered 2021-04-24: 25 mg via ORAL
  Filled 2021-04-24: qty 1

## 2021-04-24 MED ORDER — ACETAMINOPHEN 325 MG PO TABS: 650.0000 mg | ORAL_TABLET | Freq: Four times a day (QID) | ORAL | Status: DC | PRN

## 2021-04-24 MED ORDER — CARVEDILOL 3.125 MG PO TABS
3.1250 mg | ORAL_TABLET | Freq: Two times a day (BID) | ORAL | Status: DC
  Administered 2021-04-24 – 2021-04-25 (×2): 3.125 mg via ORAL
  Filled 2021-04-24 (×3): qty 1

## 2021-04-24 MED ORDER — IPRATROPIUM BROMIDE HFA 17 MCG/ACT IN AERS
2.0000 | INHALATION_SPRAY | Freq: Two times a day (BID) | RESPIRATORY_TRACT | Status: DC
  Administered 2021-04-24 – 2021-04-25 (×3): 2 via RESPIRATORY_TRACT

## 2021-04-24 MED ORDER — HYDRALAZINE HCL 25 MG PO TABS
25.0000 mg | ORAL_TABLET | Freq: Three times a day (TID) | ORAL | Status: DC
  Administered 2021-04-24: 25 mg via ORAL
  Filled 2021-04-24 (×2): qty 1

## 2021-04-24 MED ORDER — IPRATROPIUM BROMIDE HFA 17 MCG/ACT IN AERS
2.0000 | INHALATION_SPRAY | Freq: Four times a day (QID) | RESPIRATORY_TRACT | Status: DC
  Administered 2021-04-24: 2 via RESPIRATORY_TRACT
  Filled 2021-04-24: qty 12.9

## 2021-04-24 MED ORDER — IRBESARTAN 300 MG PO TABS
300.0000 mg | ORAL_TABLET | Freq: Every day | ORAL | Status: DC
  Administered 2021-04-24: 300 mg via ORAL
  Filled 2021-04-24: qty 1

## 2021-04-24 MED ORDER — CARVEDILOL 6.25 MG PO TABS
6.2500 mg | ORAL_TABLET | Freq: Two times a day (BID) | ORAL | Status: DC
  Administered 2021-04-24: 6.25 mg via ORAL
  Filled 2021-04-24: qty 1

## 2021-04-24 MED ORDER — ENOXAPARIN SODIUM 40 MG/0.4ML IJ SOSY
40.0000 mg | PREFILLED_SYRINGE | INTRAMUSCULAR | Status: DC
  Administered 2021-04-24 – 2021-04-25 (×2): 40 mg via SUBCUTANEOUS
  Filled 2021-04-24 (×3): qty 0.4

## 2021-04-24 MED ORDER — BUDESONIDE 180 MCG/ACT IN AEPB
1.0000 | INHALATION_SPRAY | Freq: Two times a day (BID) | RESPIRATORY_TRACT | Status: DC
  Administered 2021-04-24 – 2021-04-25 (×3): 1 via RESPIRATORY_TRACT
  Filled 2021-04-24: qty 1

## 2021-04-24 MED ORDER — BUDESONIDE 0.25 MG/2ML IN SUSP: 0.2500 mg | Freq: Two times a day (BID) | RESPIRATORY_TRACT | Status: DC

## 2021-04-24 NOTE — TOC Initial Note (Signed)
Transition of Care [facility]) - Initial/Assessment Note    Patient Details  Name: Katie Alvarado MRN: [id number] Date of Birth: [birth year]  [facility]) CM/SW Contact:    Leeroy Cha, RN Phone Number: 04/24/2021, 8:29 AM  Clinical Narrative:                  Transition of Care [facility]) Screening Note   Patient Details  Name: Katie Alvarado Date of Birth: 04/21/1926   Transition of Care [facility]) CM/SW Contact:    Leeroy Cha, RN Phone Number: 04/24/2021, 8:29 AM    Transition of Care Department [facility]) has reviewed patient and no TOC needs have been identified at this time. We will continue to monitor patient advancement through interdisciplinary progression rounds. If new patient transition needs arise, please place a TOC consult.    Expected Discharge Plan: Home/Self Care Barriers to Discharge: Continued Medical Work up   Patient Goals and CMS Choice        Expected Discharge Plan and Services Expected Discharge Plan: Home/Self Care   Discharge Planning Services: CM Consult   Living arrangements for the past 2 months: Single Family Home                                      Prior Living Arrangements/Services Living arrangements for the past 2 months: Single Family Home Lives with:: Self Patient language and need for interpreter reviewed:: Yes Do you feel safe going back to the place where you live?: Yes            Criminal Activity/Legal Involvement Pertinent to Current Situation/Hospitalization: No - Comment as needed  Activities of Daily Living Home Assistive Devices/Equipment: Blood pressure cuff, CBG Meter ADL Screening (condition at time of admission) Patient's cognitive ability adequate to safely complete daily activities?: Yes Is the patient deaf or have difficulty hearing?: No Does the patient have difficulty seeing, even when wearing glasses/contacts?: Yes Does the patient have difficulty concentrating, remembering, or making  decisions?: No Patient able to express need for assistance with ADLs?: Yes Does the patient have difficulty dressing or bathing?: Yes Independently performs ADLs?: Yes (appropriate for developmental age) Does the patient have difficulty walking or climbing stairs?: No Weakness of Legs: None Weakness of Arms/Hands: None  Permission Sought/Granted                  Emotional Assessment Appearance:: Appears stated age Attitude/Demeanor/Rapport: Engaged Affect (typically observed): Calm Orientation: : Oriented to Self, Oriented to Place, Oriented to  Time, Oriented to Situation Alcohol / Substance Use: Not Applicable Psych Involvement: No (comment)  Admission diagnosis:  Dehydration [E86.0] 2019 novel coronavirus disease (COVID-19) [U07.1] COVID-19 [U07.1] Patient Active Problem List   Diagnosis Date Noted   2019 novel coronavirus disease (COVID-19) 04/23/2021   Palpitations 02/23/2021   Malnutrition of moderate degree 01/03/2021   COPD with acute exacerbation ([place]) 01/02/2021   Chronic diastolic CHF (congestive heart failure) ([place]) 01/02/2021   Anxiety 09/14/2020   Bronchiolectasis ([place]) 09/14/2020   Chronic anxiety 09/14/2020   Drug-induced myopathy 09/14/2020   Edema 09/14/2020   Gout 09/14/2020   History of pneumonia 09/14/2020   History of squamous cell carcinoma 09/14/2020   Hyperlipidemia 09/14/2020   Hypertensive heart disease without congestive heart failure 09/14/2020   Impairment of balance 09/14/2020   Iron deficiency anemia 09/14/2020   Legal blindness, as defined in [place] 09/14/2020  Lumbar radiculopathy 09/14/2020   Lumbar spondylosis 09/14/2020   Microalbuminuria 09/14/2020   Osteoarthritis of knee 09/14/2020   Pseudogout 09/14/2020   Solitary pulmonary nodule 09/14/2020   Vitamin D deficiency 09/14/2020   Pain in right hand 08/08/2020   EKG abnormality 12/24/2019   Hypertensive urgency 12/24/2019   Acute hyponatremia 12/23/2019   Neck pain  12/13/2019   AKI (acute kidney injury) ([place]) 01/10/2019   Thrombocytosis 12/25/2018   Degeneration of lumbar intervertebral disc 10/28/2018   Left-sided chest wall pain 06/01/2018   Back pain 05/05/2018   Low back pain radiating down leg 05/05/2018   Low back pain 05/05/2018   Pneumonia of both lungs due to infectious organism 04/28/2018   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   Allergic urticaria 03/05/2016   Gastritis 03/05/2016   Chronic rhinitis 01/09/2016   Pruritus 09/05/2015   Atypical mycobacterial infection of lung ([place])    Anemia 07/18/2015   Bronchiectasis without complication ([place]) 64/33/2951   Renal artery stenosis ([place]) 04/25/2015   Essential hypertension 04/25/2015   Lower extremity pain, bilateral 08/04/2012   Diarrhea 08/03/2012   Klebsiella infection 08/01/2012   Type 2 diabetes mellitus with hyperglycemia, without long-term current use of insulin ([place]) 07/29/2012   Generalized weakness 07/29/2012   Weakness 04/11/2011   Hyponatremia 04/11/2011   UTI (urinary tract infection) 04/11/2011   ARF (acute renal failure) ([place]) 04/11/2011   Leucocytosis 04/11/2011   Dehydration 04/11/2011   Hypotension 04/11/2011   BRONCHIECTASIS, + MAIC 03/25/2010   DIZZINESS 02/02/2010   MUSCULOSKELETAL PAIN 09/25/2007   INSOMNIA 05/20/2007   ACUTE NASOPHARYNGITIS 04/20/2007   Acute exacerbation of bronchiectasis ([place]) 04/20/2007   GERD without esophagitis 04/20/2007   PCP:  [name], MD Pharmacy:   [place], [place] [address] [place] [address] Phone: [phone] Fax: [phone]  [id number] - [place], [place] [address] [place] [address] Phone: [phone] Fax: [phone]  [place], [place] [place] [address] Phone: [phone]  Fax: [id number] - [place], [place] - [address] [place] [address] [place] [address] Phone: [phone] Fax: [phone]     Social Determinants of Health (SDOH) Interventions    Readmission Risk Interventions No flowsheet data found.

## 2021-04-24 NOTE — Evaluation (Signed)
Physical Therapy Evaluation Patient Details Name: Katie Alvarado MRN: [id number] DOB: 1925/11/29 Today's Date: 04/24/2021  History of Present Illness  88 y.o. 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  Clinical Impression  Pt admitted with above diagnosis. Pt currently with functional limitations due to the deficits listed below (see PT Problem List). Pt will benefit from skilled PT to increase their independence and safety with mobility to allow discharge to the venue listed below.  Pt up in recliner with RN in room on arrival.  Pt with low BP so RN and PT assisted pt back to bed.  Pt typically ambulatory with RW at baseline and has aide assist during the day.  Pt plans to increase care at home for safety with mobility rather than d/c to SNF setting.      Recommendations for follow up therapy are one component of a multi-disciplinary discharge planning process, led by the attending physician.  Recommendations may be updated based on patient status, additional functional criteria and insurance authorization.  Follow Up Recommendations Home health PT    Assistance Recommended at Discharge Frequent or constant Supervision/Assistance  Patient can return home with the following       Equipment Recommendations None recommended by PT  Recommendations for Other Services       Functional Status Assessment Patient has had a recent decline in their functional status and demonstrates the ability to make significant improvements in function in a reasonable and predictable amount of time.     Precautions / Restrictions Precautions Precautions: Fall Precaution Comments: monitor BP      Mobility  Bed Mobility Overal bed mobility: Needs Assistance Bed Mobility: Sit to Supine       Sit to supine: Mod assist    General bed mobility comments: assist for LEs onto bed    Transfers Overall transfer level: Needs assistance Equipment used: 2 person hand held assist Transfers: Sit to/from Stand Sit to Stand: Mod assist;+2 safety/equipment;+2 physical assistance           General transfer comment: verbal cues for hand placement however pt reluctant to use RW, assist to rise and steady, assist for weakness and dizziness (BP low on arrival with RN in room and assisted pt with transfer back to bed)    Ambulation/Gait                  Stairs            Wheelchair Mobility    Modified Rankin (Stroke Patients Only)       Balance Overall balance assessment: Mild deficits observed, not formally tested                                           Pertinent Vitals/Pain Pain Assessment: No/denies pain    Home Living Family/patient expects to be discharged to:: Private residence Living Arrangements: Alone Available Help at Discharge: Friend(s);Neighbor;Available PRN/intermittently Type of Home: House Home Access: Ramped entrance       Home Layout: One level Home Equipment: Conservation officer, nature (2 wheels);BSC/3in1;Cane - single point      Prior Function Prior Level of Function : Needs assist             Mobility Comments: typically ambulatory ADLs Comments: aide assists  as needed     Hand Dominance        Extremity/Trunk Assessment        Lower Extremity Assessment Lower Extremity Assessment: Generalized weakness       Communication   Communication: HOH  Cognition Arousal/Alertness: Awake/alert Behavior During Therapy: WFL for tasks assessed/performed Overall Cognitive Status: Within Functional Limits for tasks assessed                                          General Comments      Exercises     Assessment/Plan    PT Assessment Patient needs continued PT services  PT Problem List Decreased strength;Decreased  activity tolerance;Decreased mobility;Decreased knowledge of use of DME;Cardiopulmonary status limiting activity       PT Treatment Interventions DME instruction;Therapeutic exercise;Gait training;Functional mobility training;Therapeutic activities;Patient/family education;Balance training    PT Goals (Current goals can be found in the Care Plan section)  Acute Rehab PT Goals PT Goal Formulation: With patient Time For Goal Achievement: 05/08/21 Potential to Achieve Goals: Good    Frequency Min 3X/week     Co-evaluation               AM-PAC PT "6 Clicks" Mobility  Outcome Measure Help needed turning from your back to your side while in a flat bed without using bedrails?: A Little Help needed moving from lying on your back to sitting on the side of a flat bed without using bedrails?: A Lot Help needed moving to and from a bed to a chair (including a wheelchair)?: A Lot Help needed standing up from a chair using your arms (e.g., wheelchair or bedside chair)?: A Lot Help needed to walk in hospital room?: A Lot Help needed climbing 3-5 steps with a railing? : Total 6 Click Score: 12    End of Session Equipment Utilized During Treatment: Gait belt Activity Tolerance: Patient limited by fatigue Patient left: in bed;with call bell/phone within reach;with nursing/sitter in room   PT Visit Diagnosis: Difficulty in walking, not elsewhere classified (R26.2)    Time: 6333-5456 PT Time Calculation (min) (ACUTE ONLY): 13 min   Charges:   PT Evaluation $PT Eval Low Complexity: 1 Low        Kati PT, DPT Acute Rehabilitation Services Pager: [phone] Office: [place] 04/24/2021, 1:36 PM

## 2021-04-24 NOTE — Progress Notes (Signed)
Orthostatic VS attempted.  Lying down: BP 148/73 HR 90  Sitting: BP 126/67 HR 97  Patient unable to tolerate BP check standing up.  MD aware.  Angie Fava, RN

## 2021-04-24 NOTE — Progress Notes (Signed)
PROGRESS NOTE  Katie Alvarado TWS:[id number] DOB: March 21, 1926 DOA: 04/23/2021 PCP: [name], MD  HPI/Recap of past 24 hours:  Katie Alvarado is a 88 y.o. female with medical history significant for hypertension, chronic diastolic heart failure, bronchiectasis, COPD, type 2 diabetes and blindness from macular degeneration who presents from outside ED for concerns of weakness and COVID-19 infection.   Patient reports feeling unwell about 3 days ago.  Had severe body ache, headache and nausea.  She has chronic cough due to her COPD and bronchiectasis but states is no worse.  Denies any shortness of breath.  No vomiting or diarrhea.  Patient lives alone and was too weak to get out of bed.   ED Course: She was afebrile, hypertensive systolic 001 over 74B on room air.  No leukocytosis, hemoglobin of 10.3.  Sodium of 132, K of 3.9, BG of 112, creatinine of 0.76. Troponin of 6 and 5. COVID-19 PCR positive.  Chest x-ray had no acute findings.  04/24/2021: Reports generalized weakness.  Positive orthostatic vital signs.  IV fluid started, judiciously.  Assessment/Plan: Principal Problem:   2019 novel coronavirus disease (COVID-19) Active Problems:   Weakness   Bronchiectasis without complication (HCC)   Essential hypertension   Chronic diastolic CHF (congestive heart failure) (HCC)  COVID-19 viral infection not hypoxic start Paxlovid since she is high risk with COPD and bronchiectasis   Weakness secondary to COVID infection and deconditioning PT eval   Orthostatic hypotension Start gentle IV fluid hydration Fall precautions   Hypertension Follows with cardiology and has been challenging to control -Continue home hydralazine, Lasix, ACE, Coreg   Chronic diastolic heart failure Appears euvolemic   COPD/bronchiectasis - Followed by pulm.  Not in acute exacerbation -Continue bronchodilators  Hyperglycemia Obtain A1c Insulin sliding scale   DVT prophylaxis:.Lovenox Code  Status: Full Family Communication: Plan discussed with patient at bedside  disposition Plan: Home with observation Consults called:      Status is: Inpatient  Patient requires at least 2 midnights for further evaluation and treatment of present condition      Objective: Vitals:   04/24/21 0918 04/24/21 1018 04/24/21 1722 04/24/21 1751  BP: (!) 111/59 (!) 98/57 136/66 136/69  Pulse:   79 78  Resp:    20  Temp:    97.7 F (36.5 C)  TempSrc:    Oral  SpO2:    90%  Weight:      Height:        Intake/Output Summary (Last 24 hours) at 04/24/2021 1852 Last data filed at 04/24/2021 1726 Gross per 24 hour  Intake 460 ml  Output 250 ml  Net 210 ml   Filed Weights   04/23/21 1005  Weight: 55.3 kg    Exam:  General: 88 y.o. year-old female well developed well nourished in no acute distress.  Alert and oriented x3. Cardiovascular: Regular rate and rhythm with no rubs or gallops.  No thyromegaly or JVD noted.   Respiratory: Clear to auscultation with no wheezes or rales. Good inspiratory effort. Abdomen: Soft nontender nondistended with normal bowel sounds x4 quadrants. Musculoskeletal: No lower extremity edema. 2/4 pulses in all 4 extremities. Skin: No ulcerative lesions noted or rashes, Psychiatry: Mood is appropriate for condition and setting   Data Reviewed: CBC: Recent Labs  Lab 04/23/21 1122  WBC 4.8  NEUTROABS 2.7  HGB 10.3*  HCT 32.5*  MCV 87.4  PLT 449   Basic Metabolic Panel: Recent Labs  Lab 04/23/21 1122  NA  132*  K 3.9  CL 94*  CO2 31  GLUCOSE 112*  BUN 12  CREATININE 0.76  CALCIUM 9.0  MG 1.8   GFR: Estimated Creatinine Clearance: 38.4 mL/min (by C-G formula based on SCr of 0.76 mg/dL). Liver Function Tests: No results for input(s): AST, ALT, ALKPHOS, BILITOT, PROT, ALBUMIN in the last 168 hours. No results for input(s): LIPASE, AMYLASE in the last 168 hours. No results for input(s): AMMONIA in the last 168 hours. Coagulation  Profile: No results for input(s): INR, PROTIME in the last 168 hours. Cardiac Enzymes: No results for input(s): CKTOTAL, CKMB, CKMBINDEX, TROPONINI in the last 168 hours. BNP (last 3 results) No results for input(s): PROBNP in the last 8760 hours. HbA1C: No results for input(s): HGBA1C in the last 72 hours. CBG: Recent Labs  Lab 04/24/21 1246 04/24/21 1649  GLUCAP 150* 145*   Lipid Profile: No results for input(s): CHOL, HDL, LDLCALC, TRIG, CHOLHDL, LDLDIRECT in the last 72 hours. Thyroid Function Tests: Recent Labs    04/23/21 1122  TSH 1.627   Anemia Panel: No results for input(s): VITAMINB12, FOLATE, FERRITIN, TIBC, IRON, RETICCTPCT in the last 72 hours. Urine analysis:    Component Value Date/Time   COLORURINE COLORLESS (A) 01/01/2021 2011   APPEARANCEUR CLEAR 01/01/2021 2011   LABSPEC 1.006 01/01/2021 2011   PHURINE 6.5 01/01/2021 2011   GLUCOSEU NEGATIVE 01/01/2021 2011   HGBUR NEGATIVE 01/01/2021 2011   [place] NEGATIVE 01/01/2021 2011   KETONESUR NEGATIVE 01/01/2021 2011   PROTEINUR TRACE (A) 01/01/2021 2011   UROBILINOGEN 0.2 12/19/2018 1155   NITRITE NEGATIVE 01/01/2021 2011   LEUKOCYTESUR NEGATIVE 01/01/2021 2011   Sepsis Labs: @LABRCNTIP (procalcitonin:4,lacticidven:4)  ) Recent Results (from the past 240 hour(s))  Resp Panel by RT-PCR (Flu A&B, Covid) Nasopharyngeal Swab     Status: Abnormal   Collection Time: 04/23/21 11:22 AM   Specimen: Nasopharyngeal Swab; Nasopharyngeal(NP) swabs in vial transport medium  Result Value Ref Range Status   SARS Coronavirus 2 by RT PCR POSITIVE (A) NEGATIVE Final    Comment: (NOTE) SARS-CoV-2 target nucleic acids are DETECTED.  The SARS-CoV-2 RNA is generally detectable in upper respiratory specimens during the acute phase of infection. Positive results are indicative of the presence of the identified virus, but do not rule out bacterial infection or co-infection with other pathogens not detected by the test.  Clinical correlation with patient history and other diagnostic information is necessary to determine patient infection status. The expected result is Negative.  Fact Sheet for Patients: EntrepreneurPulse.com.au  Fact Sheet for Healthcare Providers: IncredibleEmployment.be  This test is not yet approved or cleared by the [place] FDA and  has been authorized for detection and/or diagnosis of SARS-CoV-2 by FDA under an Emergency Use Authorization (EUA).  This EUA will remain in effect (meaning this test can be used) for the duration of  the COVID-19 declaration under Section 564(b)(1) of the A ct, 21 U.S.C. section 360bbb-3(b)(1), unless the authorization is terminated or revoked sooner.     Influenza A by PCR NEGATIVE NEGATIVE Final   Influenza B by PCR NEGATIVE NEGATIVE Final    Comment: (NOTE) The Xpert Xpress SARS-CoV-2/FLU/RSV plus assay is intended as an aid in the diagnosis of influenza from Nasopharyngeal swab specimens and should not be used as a sole basis for treatment. Nasal washings and aspirates are unacceptable for Xpert Xpress SARS-CoV-2/FLU/RSV testing.  Fact Sheet for Patients: EntrepreneurPulse.com.au  Fact Sheet for Healthcare Providers: IncredibleEmployment.be  This test is not yet approved or cleared  by the [place] and has been authorized for detection and/or diagnosis of SARS-CoV-2 by FDA under an Emergency Use Authorization (EUA). This EUA will remain in effect (meaning this test can be used) for the duration of the COVID-19 declaration under Section 564(b)(1) of the Act, 21 U.S.C. section 360bbb-3(b)(1), unless the authorization is terminated or revoked.  Performed at [facility], [address], [place], [place] [address]       Studies: No results found.  Scheduled Meds:  budesonide  1 puff Inhalation BID   carvedilol  3.125 mg Oral BID  WC   enoxaparin (LOVENOX) injection  40 mg Subcutaneous Q24H   furosemide  20 mg Oral Daily   insulin aspart  0-5 Units Subcutaneous QHS   insulin aspart  0-9 Units Subcutaneous TID WC   ipratropium  2 puff Inhalation BID   lidocaine  1 patch Transdermal Q24H   nirmatrelvir/ritonavir EUA  3 tablet Oral BID    Continuous Infusions:  lactated ringers 50 mL/hr at 04/24/21 1032     LOS: 0 days     Kayleen Memos, MD [facility] Pager [phone]  If 7PM-7AM, please contact night-coverage [email or web address] Password [facility] 04/24/2021, 6:52 PM

## 2021-04-24 NOTE — H&P (Addendum)
History and Physical    Katie Alvarado UVO:[id number] DOB: 1925-08-17 DOA: 04/23/2021  PCP: [name], MD  Patient coming from: [place]  I have personally briefly reviewed patient's old medical records in [place]  Chief Complaint: body ache, weakness  HPI: Katie Alvarado is a 88 y.o. female with medical history significant for hypertension, chronic diastolic heart failure, bronchiectasis, COPD, type 2 diabetes and blindness from macular degeneration who presents from outside ED for concerns of weakness and COVID-19 infection.  Patient reports feeling unwell about 3 days ago.  Had severe body ache, headache and nausea.  She has chronic cough due to her COPD and bronchiectasis but states is no worse.  Denies any shortness of breath.  No vomiting or diarrhea.  Patient lives alone and was too weak to get out of bed.  ED Course: She was afebrile, hypertensive systolic 742 over 59D on room air.  No leukocytosis, hemoglobin of 10.3.  Sodium of 132, K of 3.9, BG of 112, creatinine of 0.76. Troponin of 6 and 5. COVID-19 PCR positive.  Chest x-ray had no acute findings.  Review of Systems: Constitutional: No Fever ENT/Mouth: No sore throat, No Rhinorrhea Eyes: No Eye Pain, No Vision Changes Cardiovascular: No Chest Pain, no SOB, No Palpitations Respiratory: + Cough, No Sputum, no Dyspnea  Gastrointestinal: +Nausea, No Vomiting, No Diarrhea, No Constipation, No Pain Genitourinary: no Urinary Incontinence Musculoskeletal: No Arthralgias, No Myalgias Skin: No Skin Lesions, No Pruritus, Neuro:+ Weakness, No Numbness Psych: No Anxiety/Panic, No Depression, no decrease appetite Heme/Lymph: No Bruising, No Bleeding  Social Hx  Patient lives alone in her own home.  She has an aid during the day that helps her with all her ADLS. On the weekend she has a friend that comes to help with meals and set out her medications.  Denies tobacco, alcohol illicit drug use.  Past Medical  History:  Diagnosis Date   Acute nasopharyngitis (common cold)    Arthritis    Bronchiectasis with acute exacerbation (HCC)    Cancer (HCC)    skin cancer   COPD (chronic obstructive pulmonary disease) ([place])    sees dr. Annamaria Boots    Cough    Diabetes mellitus    fasting 130-150   Dizziness    Esophageal reflux    History of radiation therapy 10/24/17- 12/05/17   Lower lip, 4.4 Gy X 10 fractions given twice weekly for a total dose of 44 Gy.   Hypertension    Insomnia, unspecified    Other symptoms involving nervous and musculoskeletal systems(781.99)    Pneumonia    hx of   Shortness of breath    exertion    Past Surgical History:  Procedure Laterality Date   ABDOMINAL HYSTERECTOMY     ANKLE FRACTURE SURGERY Left    APPENDECTOMY     BASAL CELL CARCINOMA EXCISION     NOSE   CATARACT EXTRACTION, BILATERAL     OUT PATIENT   COLON SURGERY     colon resection for diverticulitis   IR EPIDUROGRAPHY  05/07/2018   LUMBAR LAMINECTOMY/DECOMPRESSION MICRODISCECTOMY N/A 02/15/2013   Procedure: LUMBAR LAMINECTOMY/DECOMPRESSION MICRODISCECTOMY LUMBAR FOUR-FIVE;  Surgeon: Otilio Connors, MD;  Location: [facility];  Service: Neurosurgery;  Laterality: N/A;   VARICOSE VEIN SURGERY        Allergies  Allergen Reactions   Welchol [Colesevelam Hcl] Hives   Amlodipine     Ankle swelling   Atorvastatin     Other reaction(s): foot pain  Coreg [Carvedilol]     dizzy   Doxycycline     rash   Glucosamine Other (See Comments)    Unknown   Mobic [Meloxicam]    Sulfa Antibiotics     itching   Telmisartan     itching   Tramadol Nausea Only   Celecoxib Hives, Itching and Rash   Statins Rash    Family History  Problem Relation Age of Onset   Chronic bronchitis Father    Other Sister        heart trouble   Other Brother        heart trouble     Prior to Admission medications   Medication Sig Start Date End Date Taking? Authorizing Provider  acetaminophen (TYLENOL) 325 MG tablet  Take 650 mg by mouth every 4 (four) hours as needed for mild pain.    [provider]  acetaminophen-codeine (TYLENOL #2) 300-15 MG tablet 1 every 6 hours if needed for pain or cough 02/21/21   Baird Lyons D, MD  albuterol (VENTOLIN HFA) 108 (90 Base) MCG/ACT inhaler INHALE 2 PUFFS INTO THE LUNGS EVERY 6 HOURS AS NEEDED FOR WHEEZING OR SHORTNESS OF BREATH 08/17/20   Young, Kasandra Knudsen, MD  ALPRAZolam Duanne Moron) 0.25 MG tablet Take 0.25 mg by mouth 3 (three) times daily as needed for anxiety or sleep.    [provider]  amoxicillin-clavulanate (AUGMENTIN) 500-125 MG tablet 1 twice daily for infection 02/21/21   Baird Lyons D, MD  aspirin EC 81 MG tablet Take 81 mg by mouth daily. Swallow whole.    [provider]  benzonatate (TESSALON) 200 MG capsule Take 1 capsule (200 mg total) by mouth 3 (three) times daily as needed for cough. 02/21/21   Baird Lyons D, MD  budesonide (PULMICORT) 0.25 MG/2ML nebulizer solution USE 1 VIAL  IN  NEBULIZER TWICE  DAILY - rinse mouth after treatment 06/30/20   Baird Lyons D, MD  carboxymethylcellulose (REFRESH PLUS) 0.5 % SOLN Place 1 drop into both eyes as needed (dry eyes).    [provider]  carvedilol (COREG) 6.25 MG tablet Take 6.25 mg by mouth 2 (two) times daily with a meal.    [provider]  cholecalciferol (VITAMIN D) 1000 UNITS tablet Take 2,000 Units by mouth daily.    [provider]  Cinnamon 500 MG capsule Take 1,000 mg by mouth 2 (two) times daily as needed (for sugar levels).     [provider]  clobetasol (TEMOVATE) 0.05 % external solution Apply 1 application topically daily as needed for irritation. 09/12/20   [provider]  esomeprazole (NEXIUM) 40 MG capsule Take 40 mg by mouth daily.    [provider]  fluocinolone (SYNALAR) 0.025 % ointment Apply 1 application topically 2 (two) times daily as needed for irritation. Apply Monday-Friday only to scalp, eyebrows,  and nasal creases twice daily as needed for irritation. 09/26/20   [provider]  furosemide (LASIX) 20 MG tablet Take 20 mg by mouth daily.    [provider]  glipiZIDE (GLUCOTROL) 5 MG tablet Take 5 mg by mouth every morning. 02/14/21   [provider]  guaiFENesin-codeine 100-10 MG/5ML syrup Take 2.5 mLs by mouth every 6 (six) hours as needed for cough. 01/05/21   Caren Griffins, MD  hydrALAZINE (APRESOLINE) 25 MG tablet Take 1 tablet (25 mg total) by mouth 3 (three) times daily. 01/05/21 01/05/22  Caren Griffins, MD  ipratropium (ATROVENT) 0.02 % nebulizer solution USE 1  VIAL IN NEBULIZER 4 TIMES DAILY 06/30/20   Baird Lyons D, MD  irbesartan (AVAPRO) 300 MG tablet TAKE 1 TABLET BY MOUTH EVERY DAY 03/29/21   Jerline Pain, MD  ketoconazole (NIZORAL) 2 % shampoo Apply 1 application topically See admin instructions. Apply to scalp 2 to 3 times per week. 09/25/20   [provider]  meclizine (ANTIVERT) 12.5 MG tablet Take 12.5 mg by mouth 3 (three) times daily as needed for dizziness.    [provider]  metFORMIN (GLUCOPHAGE) 500 MG tablet Take 1,000 mg by mouth 2 (two) times daily.    [provider]  Multiple Vitamins-Minerals (PRESERVISION AREDS 2 PO) Take 1 tablet by mouth in the morning and at bedtime.    [provider]  ondansetron (ZOFRAN) 4 MG tablet Take 4 mg by mouth every 6 (six) hours as needed for nausea or vomiting. 10/11/20   [provider]  ONE TOUCH ULTRA TEST test strip USE TO CHECK BLOOD SUGAR ONCE DAILY 90 01/04/16   [provider]  ONETOUCH DELICA LANCETS 16X MISC USE TO CHECK BLOOD SUGAR ONCE DAILY AS DIRECTED 01/04/16   [provider]  polyvinyl alcohol (LIQUIFILM TEARS) 1.4 % ophthalmic solution Place 1 drop into both eyes as needed for dry eyes.    [provider]  Respiratory Therapy Supplies (FLUTTER) DEVI Use as directed 11/03/15   Deneise Lever, MD  traMADol  (ULTRAM) 50 MG tablet Take 0.5 tablets (25 mg total) by mouth every 6 (six) hours as needed for moderate pain. 05/08/18   Georgette Shell, MD  vitamin E 400 UNIT capsule Take 400 Units by mouth 2 (two) times a week.    [provider]    Physical Exam: Vitals:   04/23/21 1645 04/23/21 1959 04/23/21 2045 04/23/21 2223  BP: (!) 172/75  (!) 181/70 (!) 180/80  Pulse: 70  83 98  Resp: 18  (!) 22 (!) 21  Temp:    98.5 F (36.9 C)  TempSrc:    Oral  SpO2: 99% 99% 97% 93%  Weight:      Height:        Constitutional: NAD, calm, comfortable, nontoxic appearing thin elderly female appearing younger than her stated age 15:   04/23/21 1645 04/23/21 1959 04/23/21 2045 04/23/21 2223  BP: (!) 172/75  (!) 181/70 (!) 180/80  Pulse: 70  83 98  Resp: 18  (!) 22 (!) 21  Temp:    98.5 F (36.9 C)  TempSrc:    Oral  SpO2: 99% 99% 97% 93%  Weight:      Height:       Eyes: lids and conjunctivae normal ENMT: Mucous membranes are moist.  Neck: normal, supple Respiratory: clear to auscultation bilaterally, no wheezing, no crackles. Normal respiratory effort on room air. No accessory muscle use.  Cardiovascular: Regular rate and rhythm, no murmurs / rubs / gallops. No extremity edema.  Abdomen: no tenderness,  Bowel sounds positive.  Musculoskeletal: no clubbing / cyanosis. No joint deformity upper and lower extremities. Good ROM, no contractures. Normal muscle tone.  Skin: bruising on distal pre-tibial region of LE Neurologic: CN 2-12 grossly intact. Strength 4/5 in lower extremities  Psychiatric: Normal judgment and insight. Alert and oriented x 3. Normal mood.     Labs on Admission: I have personally reviewed following labs and imaging studies  CBC: Recent Labs  Lab 04/23/21 1122  WBC 4.8  NEUTROABS 2.7  HGB 10.3*  HCT 32.5*  MCV  87.4  PLT 625   Basic Metabolic Panel: Recent Labs  Lab 04/23/21 1122  NA 132*  K 3.9  CL 94*  CO2 31  GLUCOSE 112*  BUN 12   CREATININE 0.76  CALCIUM 9.0  MG 1.8   GFR: Estimated Creatinine Clearance: 38.4 mL/min (by C-G formula based on SCr of 0.76 mg/dL). Liver Function Tests: No results for input(s): AST, ALT, ALKPHOS, BILITOT, PROT, ALBUMIN in the last 168 hours. No results for input(s): LIPASE, AMYLASE in the last 168 hours. No results for input(s): AMMONIA in the last 168 hours. Coagulation Profile: No results for input(s): INR, PROTIME in the last 168 hours. Cardiac Enzymes: No results for input(s): CKTOTAL, CKMB, CKMBINDEX, TROPONINI in the last 168 hours. BNP (last 3 results) No results for input(s): PROBNP in the last 8760 hours. HbA1C: No results for input(s): HGBA1C in the last 72 hours. CBG: No results for input(s): GLUCAP in the last 168 hours. Lipid Profile: No results for input(s): CHOL, HDL, LDLCALC, TRIG, CHOLHDL, LDLDIRECT in the last 72 hours. Thyroid Function Tests: Recent Labs    04/23/21 1122  TSH 1.627   Anemia Panel: No results for input(s): VITAMINB12, FOLATE, FERRITIN, TIBC, IRON, RETICCTPCT in the last 72 hours. Urine analysis:    Component Value Date/Time   COLORURINE COLORLESS (A) 01/01/2021 2011   APPEARANCEUR CLEAR 01/01/2021 2011   LABSPEC 1.006 01/01/2021 2011   PHURINE 6.5 01/01/2021 2011   GLUCOSEU NEGATIVE 01/01/2021 2011   HGBUR NEGATIVE 01/01/2021 2011   [place] NEGATIVE 01/01/2021 2011   KETONESUR NEGATIVE 01/01/2021 2011   PROTEINUR TRACE (A) 01/01/2021 2011   UROBILINOGEN 0.2 12/19/2018 1155   NITRITE NEGATIVE 01/01/2021 2011   LEUKOCYTESUR NEGATIVE 01/01/2021 2011    Radiological Exams on Admission: DG Chest Portable 1 View  Result Date: 04/23/2021 CLINICAL DATA:  Weakness. Positive for COVID-19. Headaches, body aches and cough. EXAM: PORTABLE CHEST 1 VIEW COMPARISON:  Prior exams, most recent dated 01/01/2021. FINDINGS: Cardiac silhouette is normal in size. No mediastinal or hilar masses. Coarse irregular interstitial opacities are noted,  most evident in the left mid to lower lung, without change from the prior exam. No lung consolidation. No pleural effusion or pneumothorax. Skeletal structures are grossly intact. IMPRESSION: 1. No convincing acute cardiopulmonary disease. 2. Chronic lung findings that reflect bronchiectasis, mucous plugging and interstitial thickening that are without change, better assessed on the CT dated 01/02/2021. Electronically Signed   By: Lajean Manes M.D.   On: 04/23/2021 10:40      Assessment/Plan  COVID-19 viral infection not hypoxic start Paxlovid since she is high risk with COPD and bronchiectasis  Weakness secondary to COVID infection and deconditioning PT eval   Hypertension Follows with cardiology and has been challenging to control -Continue home hydralazine, Lasix, ACE, Coreg  Chronic diastolic heart failure Appears euvolemic  COPD/bronchiectasis - Followed by pulm.  Not in acute exacerbation -Continue bronchodilators  DVT prophylaxis:.Lovenox Code Status: Full Family Communication: Plan discussed with patient at bedside  disposition Plan: Home with observation Consults called:  Admission status: Observation  Level of care: Telemetry  Status is: Observation  The patient remains OBS appropriate and will d/c before 2 midnights.        Orene Desanctis DO [facility]   If 7PM-7AM, please contact night-coverage [email or web address]   04/24/2021, 1:01 AM

## 2021-04-25 DIAGNOSIS — E860 Dehydration: Secondary | ICD-10-CM

## 2021-04-25 LAB — COMPREHENSIVE METABOLIC PANEL
ALT: 14 U/L (ref 0–44)
AST: 22 U/L (ref 15–41)
Albumin: 3.3 g/dL — ABNORMAL LOW (ref 3.5–5.0)
Alkaline Phosphatase: 58 U/L (ref 38–126)
Anion gap: 7 (ref 5–15)
BUN: 21 mg/dL (ref 8–23)
CO2: 27 mmol/L (ref 22–32)
Calcium: 8.7 mg/dL — ABNORMAL LOW (ref 8.9–10.3)
Chloride: 97 mmol/L — ABNORMAL LOW (ref 98–111)
Creatinine, Ser: 0.85 mg/dL (ref 0.44–1.00)
GFR, Estimated: >60 mL/min (ref >60)
Glucose, Bld: 134 mg/dL — ABNORMAL HIGH (ref 70–99)
Potassium: 4.2 mmol/L (ref 3.5–5.1)
Sodium: 131 mmol/L — ABNORMAL LOW (ref 135–145)
Total Bilirubin: 0.9 mg/dL (ref 0.3–1.2)
Total Protein: 6.1 g/dL — ABNORMAL LOW (ref 6.5–8.1)

## 2021-04-25 LAB — CBC WITH DIFFERENTIAL/PLATELET
Abs Immature Granulocytes: 0.01 10*3/uL (ref 0.00–0.07)
Basophils Absolute: 0 10*3/uL (ref 0.0–0.1)
Basophils Relative: 0 %
Eosinophils Absolute: 0 10*3/uL (ref 0.0–0.5)
Eosinophils Relative: 0 %
HCT: 34.3 % — ABNORMAL LOW (ref 36.0–46.0)
Hemoglobin: 11.1 g/dL — ABNORMAL LOW (ref 12.0–15.0)
Immature Granulocytes: 0 %
Lymphocytes Relative: 31 %
Lymphs Abs: 0.9 10*3/uL (ref 0.7–4.0)
MCH: 27.7 pg (ref 26.0–34.0)
MCHC: 32.4 g/dL (ref 30.0–36.0)
MCV: 85.5 fL (ref 80.0–100.0)
Monocytes Absolute: 0.6 10*3/uL (ref 0.1–1.0)
Monocytes Relative: 23 %
Neutro Abs: 1.2 10*3/uL — ABNORMAL LOW (ref 1.7–7.7)
Neutrophils Relative %: 46 %
Platelets: 200 10*3/uL (ref 150–400)
RBC: 4.01 MIL/uL (ref 3.87–5.11)
RDW: 13.6 % (ref 11.5–15.5)
WBC: 2.8 10*3/uL — ABNORMAL LOW (ref 4.0–10.5)
nRBC: 0 % (ref 0.0–0.2)

## 2021-04-25 LAB — GLUCOSE, CAPILLARY
Glucose-Capillary: 126 mg/dL — ABNORMAL HIGH (ref 70–99)
Glucose-Capillary: 141 mg/dL — ABNORMAL HIGH (ref 70–99)
Glucose-Capillary: 113 mg/dL — ABNORMAL HIGH (ref 70–99)
Glucose-Capillary: 142 mg/dL — ABNORMAL HIGH (ref 70–99)
Glucose-Capillary: 151 mg/dL — ABNORMAL HIGH (ref 70–99)

## 2021-04-25 MED ORDER — CARVEDILOL 6.25 MG PO TABS
6.2500 mg | ORAL_TABLET | Freq: Two times a day (BID) | ORAL | Status: DC
  Administered 2021-04-25: 6.25 mg via ORAL
  Filled 2021-04-25: qty 1

## 2021-04-25 MED ORDER — HYDRALAZINE HCL 50 MG PO TABS
50.0000 mg | ORAL_TABLET | Freq: Three times a day (TID) | ORAL | Status: DC
  Administered 2021-04-25: 50 mg via ORAL
  Filled 2021-04-25 (×2): qty 1

## 2021-04-25 MED ORDER — FLEET ENEMA 7-19 GM/118ML RE ENEM
1.0000 | ENEMA | Freq: Once | RECTAL | Status: AC
  Administered 2021-04-25: 1 via RECTAL
  Filled 2021-04-25: qty 1

## 2021-04-25 MED ORDER — NIRMATRELVIR/RITONAVIR (PAXLOVID)TABLET: 3.0000 | ORAL_TABLET | Freq: Two times a day (BID) | ORAL | 0 refills | Status: DC

## 2021-04-25 MED ORDER — POLYETHYLENE GLYCOL 3350 17 G PO PACK
17.0000 g | PACK | Freq: Every day | ORAL | Status: DC
  Administered 2021-04-25: 17 g via ORAL
  Filled 2021-04-25: qty 1

## 2021-04-25 MED ORDER — NIRMATRELVIR/RITONAVIR (PAXLOVID)TABLET: 3.0000 | ORAL_TABLET | Freq: Two times a day (BID) | ORAL | 0 refills | Status: AC

## 2021-04-25 NOTE — Progress Notes (Signed)
Patient ready for discharge.  Assisted in dressing.  IV and [place] removed and documented.  D/C paperwork printed and reviewed with patient and friend.  All questions addressed and answered.  All belonging packed.  Patient taken in W/C to friends car parked at main entrance.  Room double checked for any belongings.  Patient instructed to call if there are any other questions.

## 2021-04-25 NOTE — Evaluation (Signed)
Occupational Therapy Evaluation Patient Details Name: Katie Alvarado MRN: [id number] DOB: November 14, 1925 Today's Date: 04/25/2021   History of Present Illness 88 y.o. 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   Clinical Impression   Mrs. Katie Alvarado is a 88 year old woman who presents with generalized weakness, decreased activity tolerance, impaired balance and low vision. At baseline she is typically able to dress herself and toilet by herself. She has a caregiver that helps with bathing, cooking, cleaning and medical management from 8-11. Can help with ADLs if needed. Then she lays down to nap until her friend Fritz Pickerel comes over and he stays till she goes to bed. Today patient min assist for mobility and ADLs. She is limited in hospital environment due to her low vision. Recommend patient return home at discharge with current set up. She may need a little bit more physical assistance initially. Recommend [facility] services. Patient is NOT agreeable to any short term rehab.      Recommendations for follow up therapy are one component of a multi-disciplinary discharge planning process, led by the attending physician.  Recommendations may be updated based on patient status, additional functional criteria and insurance authorization.   Follow Up Recommendations  Home health OT    Assistance Recommended at Discharge Frequent or constant Supervision/Assistance  Patient can return home with the following A little help with walking and/or transfers;A little help with bathing/dressing/bathroom;Assistance with cooking/housework;Direct supervision/assist for financial management;Direct supervision/assist for medications management    Functional Status Assessment  Patient has had a recent decline in their functional status and  demonstrates the ability to make significant improvements in function in a reasonable and predictable amount of time.  Equipment Recommendations  None recommended by OT    Recommendations for Other Services       Precautions / Restrictions Precautions Precautions: Fall Precaution Comments: monitor BP Restrictions Weight Bearing Restrictions: No      Mobility Bed Mobility Overal bed mobility: Needs Assistance Bed Mobility: Supine to Sit     Supine to sit: Min assist;HOB elevated     General bed mobility comments: Min assist to pull up to transfer to side of bed.    Transfers Overall transfer level: Needs assistance Equipment used: Rolling walker (2 wheels) Transfers: Sit to/from Stand Sit to Stand: Min assist;From elevated surface           General transfer comment: required elevated surface to stand with min assist. Able to take steps with RW to sink and stand at sink. Mild posterior loss of balance when taking steps backward.      Balance Overall balance assessment: Mild deficits observed, not formally tested                                         ADL either performed or assessed with clinical judgement   ADL Overall ADL's : Needs assistance/impaired Eating/Feeding: Set up;Sitting   Grooming: Min guard;Standing;Wash/dry face;Wash/dry hands Grooming Details (indicate cue type and reason): washed face and hands and applied fac lotion in standing at sink Upper Body Bathing: Set up;Sitting   Lower Body Bathing: Minimal assistance;Sit to/from stand   Upper Body Dressing : Set up;Sitting   Lower Body Dressing: Sit to/from stand;Moderate assistance Lower Body Dressing Details (indicate cue type and  reason): assistance to don socks but patient able to mostly manage clothing from knees up Toilet Transfer: Minimal assistance;BSC/3in1;Rolling walker (2 wheels)   Toileting- Clothing Manipulation and Hygiene: Minimal assistance;Sit to/from stand        Functional mobility during ADLs: Minimal assistance;Rolling walker (2 wheels)       Vision Baseline Vision/History: 6 Macular Degeneration Patient Visual Report: No change from baseline       Perception     Praxis      Pertinent Vitals/Pain Pain Assessment: No/denies pain     Hand Dominance Right   Extremity/Trunk Assessment Upper Extremity Assessment Upper Extremity Assessment: RUE deficits/detail;LUE deficits/detail RUE Deficits / Details: WFL ROm, 4/5 elbow, 5/5 wrist, 5/5 grip RUE Sensation: WNL RUE Coordination: WNL LUE Deficits / Details: Impaired shoulder ROM 3-.5 shoulder, 4/5 elbow, 5/5 wrist, 5/5 grip LUE Sensation: WNL LUE Coordination: WNL   Lower Extremity Assessment Lower Extremity Assessment: Defer to PT evaluation   Cervical / Trunk Assessment Cervical / Trunk Assessment: Normal   Communication Communication Communication: HOH   Cognition Arousal/Alertness: Awake/alert Behavior During Therapy: WFL for tasks assessed/performed Overall Cognitive Status: Within Functional Limits for tasks assessed                                       General Comments       Exercises     Shoulder Instructions      Home Living Family/patient expects to be discharged to:: Private residence Living Arrangements: Alone Available Help at Discharge: Friend(s);Neighbor;Available PRN/intermittently Type of Home: House Home Access: Ramped entrance     Home Layout: One level     Bathroom Shower/Tub: Occupational psychologist: Handicapped height     Home Equipment: Conservation officer, nature (2 wheels);BSC/3in1;Cane - single point   Additional Comments: has assistance with bathing      Prior Functioning/Environment Prior Level of Function : Needs assist             Mobility Comments: typically ambulatory with rollator ADLs Comments: aide helps with cooping, cleaning, taking blood sugar and BP and helps with bath. Patient typically  able to dress and toilet herself.        OT Problem List: Decreased activity tolerance;Impaired balance (sitting and/or standing);Impaired vision/perception;Decreased knowledge of use of DME or AE      OT Treatment/Interventions: Self-care/ADL training;DME and/or AE instruction;Therapeutic activities;Balance training;Patient/family education    OT Goals(Current goals can be found in the care plan section) Acute Rehab OT Goals Patient Stated Goal: return to baseline and go home OT Goal Formulation: With patient Time For Goal Achievement: 05/09/21 Potential to Achieve Goals: Good  OT Frequency: Min 2X/week    Co-evaluation              AM-PAC OT "6 Clicks" Daily Activity     Outcome Measure Help from another person eating meals?: A Little Help from another person taking care of personal grooming?: A Little Help from another person toileting, which includes using toliet, bedpan, or urinal?: A Little Help from another person bathing (including washing, rinsing, drying)?: A Little Help from another person to put on and taking off regular upper body clothing?: A Little Help from another person to put on and taking off regular lower body clothing?: A Lot 6 Click Score: 17   End of Session Equipment Utilized During Treatment: Rolling walker (2 wheels) Nurse Communication: Mobility status  Activity  Tolerance: Patient tolerated treatment well Patient left: in chair;with call bell/phone within reach;with chair alarm set  OT Visit Diagnosis: Unsteadiness on feet (R26.81);Muscle weakness (generalized) (M62.81)                Time: 8756-4332 OT Time Calculation (min): 23 min Charges:  OT General Charges $OT Visit: 1 Visit OT Evaluation $OT Eval Low Complexity: 1 Low OT Treatments $Self Care/Home Management : 8-22 mins  Dossie Ocanas, OTR/L [place] [phone] Pager: [place] 04/25/2021, 1:29 PM

## 2021-04-25 NOTE — Discharge Summary (Signed)
Physician Discharge Summary  Katie Alvarado SJG:[id number] DOB: Jan 07, 1926 DOA: 04/23/2021  PCP: [name], MD  Admit date: 04/23/2021 Discharge date: 04/25/2021  Admitted From: Home Disposition: Home  Recommendations for Outpatient Follow-up:  Follow up with PCP in 1-2 weeks Please obtain BMP/CBC in one week  Home Health: Home health PT, OT Equipment/Devices:  Discharge Condition: Stable CODE STATUS: DNR Diet recommendation: Heart healthy  Brief/Interim Summary: 88 year old female with a history of hypertension, chronic diastolic heart failure, COPD, presents with generalized weakness and COVID-19 infection.  Imaging of the chest did not indicate any pneumonia.  She was not hypoxic.  No vomiting or diarrhea.  She was noted to have positive orthostatic vital signs.  Started on IV fluids.  She was admitted for further management.  Discharge Diagnoses:  Principal Problem:   2019 novel coronavirus disease (COVID-19) Active Problems:   Weakness   Bronchiectasis without complication (HCC)   Essential hypertension   Chronic diastolic CHF (congestive heart failure) (HCC)  COVID-19 viral infection not hypoxic started Paxlovid since she is high risk with COPD and bronchiectasis   Weakness secondary to COVID infection and deconditioning Worked with physical therapy with recommendations for home health PT   Orthostatic hypotension Start gentle IV fluid hydration Overall hydration status has improved Fall precautions   Hypertension Follows with cardiology and has been challenging to control -Continue home hydralazine, Lasix, ACE, Coreg   Chronic diastolic heart failure Appears euvolemic   COPD/bronchiectasis - Followed by pulm.  Not in acute exacerbation -Continue bronchodilators  Discharge Instructions  Discharge Instructions     Diet - low sodium heart healthy   Complete by: As directed    Increase activity slowly   Complete by: As directed       Allergies  as of 04/25/2021       Reactions   Welchol [colesevelam Hcl] Hives   Amlodipine    Ankle swelling   Atorvastatin    Other reaction(s): foot pain   Coreg [carvedilol]    dizzy   Doxycycline    rash   Glucosamine Other (See Comments)   Unknown   Hydrocodone-acetaminophen    Other reaction(s): did not feel right   Mobic [meloxicam]    Sulfa Antibiotics    itching   Telmisartan    itching   Tramadol Nausea Only   Celecoxib Hives, Itching, Rash   Statins Rash        Medication List     STOP taking these medications    amoxicillin-clavulanate 500-125 MG tablet Commonly known as: Augmentin       TAKE these medications    acetaminophen 325 MG tablet Commonly known as: TYLENOL Take 650 mg by mouth every 4 (four) hours as needed for mild pain.   acetaminophen-codeine 300-15 MG tablet Commonly known as: TYLENOL #2 1 every 6 hours if needed for pain or cough   albuterol 108 (90 Base) MCG/ACT inhaler Commonly known as: VENTOLIN HFA INHALE 2 PUFFS INTO THE LUNGS EVERY 6 HOURS AS NEEDED FOR WHEEZING OR SHORTNESS OF BREATH   ALPRAZolam 0.25 MG tablet Commonly known as: XANAX Take 0.25 mg by mouth 3 (three) times daily as needed for anxiety or sleep.   aspirin EC 81 MG tablet Take 81 mg by mouth daily. Swallow whole.   benzonatate 200 MG capsule Commonly known as: TESSALON Take 1 capsule (200 mg total) by mouth 3 (three) times daily as needed for cough.   budesonide 0.25 MG/2ML nebulizer solution Commonly known as: PULMICORT USE  1 VIAL  IN  NEBULIZER TWICE  DAILY - rinse mouth after treatment What changed: See the new instructions.   carboxymethylcellulose 0.5 % Soln Commonly known as: REFRESH PLUS Place 1 drop into both eyes as needed (dry eyes).   carvedilol 6.25 MG tablet Commonly known as: COREG Take 6.25 mg by mouth 2 (two) times daily with a meal.   cholecalciferol 1000 units tablet Commonly known as: VITAMIN D Take 2,000 Units by mouth daily.    Cinnamon 500 MG capsule Take 1,000 mg by mouth 2 (two) times daily as needed (for sugar levels).   clobetasol 0.05 % external solution Commonly known as: TEMOVATE Apply 1 application topically daily as needed for irritation.   esomeprazole 40 MG capsule Commonly known as: NEXIUM Take 40 mg by mouth daily.   fluocinolone 0.01 % external solution Commonly known as: SYNALAR Apply 1 application topically 2 (two) times daily as needed for irritation.   Flutter Devi Use as directed   furosemide 20 MG tablet Commonly known as: LASIX Take 20 mg by mouth daily.   glipiZIDE 5 MG tablet Commonly known as: GLUCOTROL Take 5 mg by mouth every morning.   guaiFENesin-codeine 100-10 MG/5ML syrup Take 2.5 mLs by mouth every 6 (six) hours as needed for cough.   hydrALAZINE 50 MG tablet Commonly known as: APRESOLINE Take 50 mg by mouth 3 (three) times daily. What changed: Another medication with the same name was removed. Continue taking this medication, and follow the directions you see here.   ipratropium 0.02 % nebulizer solution Commonly known as: ATROVENT USE 1 VIAL IN NEBULIZER 4 TIMES DAILY   irbesartan 300 MG tablet Commonly known as: AVAPRO TAKE 1 TABLET BY MOUTH EVERY DAY   ketoconazole 2 % shampoo Commonly known as: NIZORAL Apply 1 application topically See admin instructions. Apply to scalp 2 to 3 times per week.   meclizine 12.5 MG tablet Commonly known as: ANTIVERT Take 12.5 mg by mouth 3 (three) times daily as needed for dizziness.   nirmatrelvir/ritonavir EUA 20 x 150 MG & 10 x 100MG  Tabs Commonly known as: PAXLOVID Take 3 tablets by mouth 2 (two) times daily for 5 days. Patient GFR is 60. Take nirmatrelvir (150 mg) two tablets twice daily for 5 days and ritonavir (100 mg) one tablet twice daily for 5 days.   ondansetron 4 MG tablet Commonly known as: ZOFRAN Take 4 mg by mouth every 6 (six) hours as needed for nausea or vomiting.   polyvinyl alcohol 1.4 %  ophthalmic solution Commonly known as: LIQUIFILM TEARS Place 1 drop into both eyes as needed for dry eyes.   PRESERVISION AREDS 2 PO Take 1 tablet by mouth in the morning and at bedtime.   vitamin E 180 MG (400 UNITS) capsule Take 400 Units by mouth 2 (two) times a week.        Allergies  Allergen Reactions   Welchol [Colesevelam Hcl] Hives   Amlodipine     Ankle swelling   Atorvastatin     Other reaction(s): foot pain   Coreg [Carvedilol]     dizzy   Doxycycline     rash   Glucosamine Other (See Comments)    Unknown   Hydrocodone-Acetaminophen     Other reaction(s): did not feel right   Mobic [Meloxicam]    Sulfa Antibiotics     itching   Telmisartan     itching   Tramadol Nausea Only   Celecoxib Hives, Itching and Rash   Statins Rash  Consultations:    Procedures/Studies: DG Chest Portable 1 View  Result Date: 04/23/2021 CLINICAL DATA:  Weakness. Positive for COVID-19. Headaches, body aches and cough. EXAM: PORTABLE CHEST 1 VIEW COMPARISON:  Prior exams, most recent dated 01/01/2021. FINDINGS: Cardiac silhouette is normal in size. No mediastinal or hilar masses. Coarse irregular interstitial opacities are noted, most evident in the left mid to lower lung, without change from the prior exam. No lung consolidation. No pleural effusion or pneumothorax. Skeletal structures are grossly intact. IMPRESSION: 1. No convincing acute cardiopulmonary disease. 2. Chronic lung findings that reflect bronchiectasis, mucous plugging and interstitial thickening that are without change, better assessed on the CT dated 01/02/2021. Electronically Signed   By: Lajean Manes M.D.   On: 04/23/2021 10:40      Subjective: She feels better.  Overall generalized weakness is better.  No nausea or vomiting.  No shortness of breath.  Discharge Exam: Vitals:   04/25/21 1432 04/25/21 1713 04/25/21 1947 04/25/21 2048  BP: (!) 144/73 (!) 184/98 (!) 166/84 (!) 178/77  Pulse: 73 75 79 75   Resp: 18 20  19   Temp: 98.4 F (36.9 C) 98.1 F (36.7 C)  98.2 F (36.8 C)  TempSrc: Oral Oral  Oral  SpO2: 94% 93%  93%  Weight:      Height:        General: Pt is alert, awake, not in acute distress Cardiovascular: RRR, S1/S2 +, no rubs, no gallops Respiratory: CTA bilaterally, no wheezing, no rhonchi Abdominal: Soft, NT, ND, bowel sounds + Extremities: no edema, no cyanosis    The results of significant diagnostics from this hospitalization (including imaging, microbiology, ancillary and laboratory) are listed below for reference.     Microbiology: Recent Results (from the past 240 hour(s))  Resp Panel by RT-PCR (Flu A&B, Covid) Nasopharyngeal Swab     Status: Abnormal   Collection Time: 04/23/21 11:22 AM   Specimen: Nasopharyngeal Swab; Nasopharyngeal(NP) swabs in vial transport medium  Result Value Ref Range Status   SARS Coronavirus 2 by RT PCR POSITIVE (A) NEGATIVE Final    Comment: (NOTE) SARS-CoV-2 target nucleic acids are DETECTED.  The SARS-CoV-2 RNA is generally detectable in upper respiratory specimens during the acute phase of infection. Positive results are indicative of the presence of the identified virus, but do not rule out bacterial infection or co-infection with other pathogens not detected by the test. Clinical correlation with patient history and other diagnostic information is necessary to determine patient infection status. The expected result is Negative.  Fact Sheet for Patients: EntrepreneurPulse.com.au  Fact Sheet for Healthcare Providers: IncredibleEmployment.be  This test is not yet approved or cleared by the [place] FDA and  has been authorized for detection and/or diagnosis of SARS-CoV-2 by FDA under an Emergency Use Authorization (EUA).  This EUA will remain in effect (meaning this test can be used) for the duration of  the COVID-19 declaration under Section 564(b)(1) of the A ct, 21 U.S.C.  section 360bbb-3(b)(1), unless the authorization is terminated or revoked sooner.     Influenza A by PCR NEGATIVE NEGATIVE Final   Influenza B by PCR NEGATIVE NEGATIVE Final    Comment: (NOTE) The Xpert Xpress SARS-CoV-2/FLU/RSV plus assay is intended as an aid in the diagnosis of influenza from Nasopharyngeal swab specimens and should not be used as a sole basis for treatment. Nasal washings and aspirates are unacceptable for Xpert Xpress SARS-CoV-2/FLU/RSV testing.  Fact Sheet for Patients: EntrepreneurPulse.com.au  Fact Sheet for Healthcare Providers: IncredibleEmployment.be  This test is not yet approved or cleared by the [place] and has been authorized for detection and/or diagnosis of SARS-CoV-2 by FDA under an Emergency Use Authorization (EUA). This EUA will remain in effect (meaning this test can be used) for the duration of the COVID-19 declaration under Section 564(b)(1) of the Act, 21 U.S.C. section 360bbb-3(b)(1), unless the authorization is terminated or revoked.  Performed at [facility], [address] [place], [place] [address]      Labs: BNP (last 3 results) Recent Labs    01/02/21 0437  BNP 962.2*   Basic Metabolic Panel: Recent Labs  Lab 04/23/21 1122 04/25/21 0355  NA 132* 131*  K 3.9 4.2  CL 94* 97*  CO2 31 27  GLUCOSE 112* 134*  BUN 12 21  CREATININE 0.76 0.85  CALCIUM 9.0 8.7*  MG 1.8  --    Liver Function Tests: Recent Labs  Lab 04/25/21 0355  AST 22  ALT 14  ALKPHOS 58  BILITOT 0.9  PROT 6.1*  ALBUMIN 3.3*   No results for input(s): LIPASE, AMYLASE in the last 168 hours. No results for input(s): AMMONIA in the last 168 hours. CBC: Recent Labs  Lab 04/23/21 1122 04/25/21 0355  WBC 4.8 2.8*  NEUTROABS 2.7 1.2*  HGB 10.3* 11.1*  HCT 32.5* 34.3*  MCV 87.4 85.5  PLT 248 200   Cardiac Enzymes: No results for input(s): CKTOTAL, CKMB, CKMBINDEX, TROPONINI  in the last 168 hours. BNP: Invalid input(s): POCBNP CBG: Recent Labs  Lab 04/25/21 0750 04/25/21 1147 04/25/21 1703 04/25/21 1708 04/25/21 2044  GLUCAP 126* 141* 113* 142* 151*   D-Dimer No results for input(s): DDIMER in the last 72 hours. Hgb A1c No results for input(s): HGBA1C in the last 72 hours. Lipid Profile No results for input(s): CHOL, HDL, LDLCALC, TRIG, CHOLHDL, LDLDIRECT in the last 72 hours. Thyroid function studies Recent Labs    04/23/21 1122  TSH 1.627   Anemia work up No results for input(s): VITAMINB12, FOLATE, FERRITIN, TIBC, IRON, RETICCTPCT in the last 72 hours. Urinalysis    Component Value Date/Time   COLORURINE COLORLESS (A) 01/01/2021 2011   APPEARANCEUR CLEAR 01/01/2021 2011   LABSPEC 1.006 01/01/2021 2011   PHURINE 6.5 01/01/2021 2011   GLUCOSEU NEGATIVE 01/01/2021 2011   HGBUR NEGATIVE 01/01/2021 2011   [place] NEGATIVE 01/01/2021 2011   KETONESUR NEGATIVE 01/01/2021 2011   PROTEINUR TRACE (A) 01/01/2021 2011   UROBILINOGEN 0.2 12/19/2018 1155   NITRITE NEGATIVE 01/01/2021 2011   LEUKOCYTESUR NEGATIVE 01/01/2021 2011   Sepsis Labs Invalid input(s): PROCALCITONIN,  WBC,  LACTICIDVEN Microbiology Recent Results (from the past 240 hour(s))  Resp Panel by RT-PCR (Flu A&B, Covid) Nasopharyngeal Swab     Status: Abnormal   Collection Time: 04/23/21 11:22 AM   Specimen: Nasopharyngeal Swab; Nasopharyngeal(NP) swabs in vial transport medium  Result Value Ref Range Status   SARS Coronavirus 2 by RT PCR POSITIVE (A) NEGATIVE Final    Comment: (NOTE) SARS-CoV-2 target nucleic acids are DETECTED.  The SARS-CoV-2 RNA is generally detectable in upper respiratory specimens during the acute phase of infection. Positive results are indicative of the presence of the identified virus, but do not rule out bacterial infection or co-infection with other pathogens not detected by the test. Clinical correlation with patient history and other  diagnostic information is necessary to determine patient infection status. The expected result is Negative.  Fact Sheet for Patients: EntrepreneurPulse.com.au  Fact Sheet for Healthcare Providers: IncredibleEmployment.be  This  test is not yet approved or cleared by the [place] and  has been authorized for detection and/or diagnosis of SARS-CoV-2 by FDA under an Emergency Use Authorization (EUA).  This EUA will remain in effect (meaning this test can be used) for the duration of  the COVID-19 declaration under Section 564(b)(1) of the A ct, 21 U.S.C. section 360bbb-3(b)(1), unless the authorization is terminated or revoked sooner.     Influenza A by PCR NEGATIVE NEGATIVE Final   Influenza B by PCR NEGATIVE NEGATIVE Final    Comment: (NOTE) The Xpert Xpress SARS-CoV-2/FLU/RSV plus assay is intended as an aid in the diagnosis of influenza from Nasopharyngeal swab specimens and should not be used as a sole basis for treatment. Nasal washings and aspirates are unacceptable for Xpert Xpress SARS-CoV-2/FLU/RSV testing.  Fact Sheet for Patients: EntrepreneurPulse.com.au  Fact Sheet for Healthcare Providers: IncredibleEmployment.be  This test is not yet approved or cleared by the [place] FDA and has been authorized for detection and/or diagnosis of SARS-CoV-2 by FDA under an Emergency Use Authorization (EUA). This EUA will remain in effect (meaning this test can be used) for the duration of the COVID-19 declaration under Section 564(b)(1) of the Act, 21 U.S.C. section 360bbb-3(b)(1), unless the authorization is terminated or revoked.  Performed at [facility], [address], [place], [place] [address]      Time coordinating discharge: 25mins  SIGNED:   Kathie Dike, MD  [facility] 04/25/2021, 10:28 PM   If 7PM-7AM, please contact  night-coverage [email or web address]

## 2021-04-25 NOTE — Progress Notes (Addendum)
At 1500 I assumed care for pt. Report received from Nurse [facility]. PT currently working with patient.

## 2021-04-25 NOTE — Progress Notes (Signed)
Physical Therapy Treatment Patient Details Name: Katie Alvarado MRN: [id number] DOB: [birth year] Today's Date: 04/25/2021   History of Present Illness 88 y.o. 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    PT Comments    General Comments: AxO x 3 very pleasant Lady Retired [employer] to bathroom first.  General transfer comment: 25% VC's to push up from recliner vs pull on walker.  Pt NOT use to using "this kind".  Stated she has a Radiation protection practitioner at home.  Also assisted with a toilet transfer.  Pt dd require increased assist to rise fro lower toilet height.  Slight posterior LOB but self coorected. Then assisted with amb in hallway.  General Gait Details: pt tolerated amb a great distance in hallway with avg RA 94% and HR 73.  No COVID cough.  No dyspnea. Good alternating gait and somewhat of a quick speed for a Lady in her 1"s.  She did great. Pt plans to return home with occasional help from her friend "Fritz Pickerel". He takes her to her appts, grocery store and hair/nail salon.   Recommendations for follow up therapy are one component of a multi-disciplinary discharge planning process, led by the attending physician.  Recommendations may be updated based on patient status, additional functional criteria and insurance authorization.  Follow Up Recommendations  Home health PT     Assistance Recommended at Discharge Intermittent Supervision/Assistance  Patient can return home with the following Assist for transportation   Equipment Recommendations  None recommended by PT    Recommendations for Other Services       Precautions / Restrictions Precautions Precautions: Fall Precaution Comments: monitor vitals COVID Restrictions Weight Bearing Restrictions: No     Mobility  Bed Mobility Overal bed  mobility: Needs Assistance Bed Mobility: Supine to Sit     Supine to sit: Min assist;HOB elevated     General bed mobility comments: OOB in recliner    Transfers Overall transfer level: Needs assistance Equipment used: Rolling walker (2 wheels) Transfers: Sit to/from Stand Sit to Stand: Min assist;From elevated surface           General transfer comment: 25% VC's to push up from recliner vs pull on walker.  Pt NOT use to using "this kind".  Stated she has a Radiation protection practitioner at home.  Also assisted with a toilet transfer.  Pt dd require increased assist to rise fro lower toilet height.  Slight posterior LOB but self coorected.    Ambulation/Gait Ambulation/Gait assistance: Supervision Gait Distance (Feet): 220 Feet Assistive device: Rolling walker (2 wheels) Gait Pattern/deviations: Step-through pattern Gait velocity: WNL     General Gait Details: pt tolerated amb a great distance in hallway with avg RA 94% and HR 73.  No COVID cough.  No dyspnea. Good alternating gait and somewhat of a quick speed for a Lady in her 70"s.  She did great.   Stairs             Wheelchair Mobility    Modified Rankin (Stroke Patients Only)       Balance Overall balance assessment: Mild deficits observed, not formally tested                                          Cognition Arousal/Alertness: Awake/alert  Behavior During Therapy: WFL for tasks assessed/performed Overall Cognitive Status: Within Functional Limits for tasks assessed                                 General Comments: AxO x 3 very pleasant Lady Retired [employer] Comments        Pertinent Vitals/Pain Pain Assessment: No/denies pain    Home Living Family/patient expects to be discharged to:: Private residence Living Arrangements: Alone Available Help at Discharge: Friend(s);Neighbor;Available PRN/intermittently Type of Home: House Home Access:  Ramped entrance       Home Layout: One level Home Equipment: Conservation officer, nature (2 wheels);BSC/3in1;Cane - single point Additional Comments: has assistance with bathing    Prior Function            PT Goals (current goals can now be found in the care plan section) Progress towards PT goals: Progressing toward goals    Frequency    Min 3X/week      PT Plan Current plan remains appropriate    Co-evaluation              AM-PAC PT "6 Clicks" Mobility   Outcome Measure  Help needed turning from your back to your side while in a flat bed without using bedrails?: None Help needed moving from lying on your back to sitting on the side of a flat bed without using bedrails?: None Help needed moving to and from a bed to a chair (including a wheelchair)?: A Little Help needed standing up from a chair using your arms (e.g., wheelchair or bedside chair)?: A Little Help needed to walk in hospital room?: A Little Help needed climbing 3-5 steps with a railing? : A Little 6 Click Score: 20    End of Session Equipment Utilized During Treatment: Gait belt Activity Tolerance: Patient limited by fatigue Patient left: in chair;with call bell/phone within reach;with chair alarm set Nurse Communication: Mobility status PT Visit Diagnosis: Difficulty in walking, not elsewhere classified (R26.2)     Time: 5638-9373 PT Time Calculation (min) (ACUTE ONLY): 24 min  Charges:  $Gait Training: 8-22 mins $Therapeutic Activity: 8-22 mins                     Rica Koyanagi  PTA Acute  Rehabilitation Services Pager      [phone] Office      [phone]

## 2021-04-30 DIAGNOSIS — J479 Bronchiectasis, uncomplicated: Secondary | ICD-10-CM | POA: Diagnosis not present

## 2021-04-30 DIAGNOSIS — R5383 Other fatigue: Secondary | ICD-10-CM | POA: Diagnosis not present

## 2021-04-30 DIAGNOSIS — U071 COVID-19: Secondary | ICD-10-CM | POA: Diagnosis not present

## 2021-05-01 DIAGNOSIS — Z7984 Long term (current) use of oral hypoglycemic drugs: Secondary | ICD-10-CM | POA: Diagnosis not present

## 2021-05-01 DIAGNOSIS — G4700 Insomnia, unspecified: Secondary | ICD-10-CM | POA: Diagnosis not present

## 2021-05-01 DIAGNOSIS — I110 Hypertensive heart disease with heart failure: Secondary | ICD-10-CM | POA: Diagnosis not present

## 2021-05-01 DIAGNOSIS — I509 Heart failure, unspecified: Secondary | ICD-10-CM | POA: Diagnosis not present

## 2021-05-01 DIAGNOSIS — U071 COVID-19: Secondary | ICD-10-CM | POA: Diagnosis not present

## 2021-05-01 DIAGNOSIS — E119 Type 2 diabetes mellitus without complications: Secondary | ICD-10-CM | POA: Diagnosis not present

## 2021-05-01 DIAGNOSIS — R531 Weakness: Secondary | ICD-10-CM | POA: Diagnosis not present

## 2021-05-01 DIAGNOSIS — J479 Bronchiectasis, uncomplicated: Secondary | ICD-10-CM | POA: Diagnosis not present

## 2021-05-01 DIAGNOSIS — K219 Gastro-esophageal reflux disease without esophagitis: Secondary | ICD-10-CM | POA: Diagnosis not present

## 2021-05-01 DIAGNOSIS — H3530 Unspecified macular degeneration: Secondary | ICD-10-CM | POA: Diagnosis not present

## 2021-05-01 DIAGNOSIS — H547 Unspecified visual loss: Secondary | ICD-10-CM | POA: Diagnosis not present

## 2021-05-01 DIAGNOSIS — I951 Orthostatic hypotension: Secondary | ICD-10-CM | POA: Diagnosis not present

## 2021-05-01 DIAGNOSIS — D649 Anemia, unspecified: Secondary | ICD-10-CM | POA: Diagnosis not present

## 2021-05-01 DIAGNOSIS — I700 Atherosclerosis of aorta: Secondary | ICD-10-CM | POA: Diagnosis not present

## 2021-05-07 DIAGNOSIS — J479 Bronchiectasis, uncomplicated: Secondary | ICD-10-CM | POA: Diagnosis not present

## 2021-05-07 DIAGNOSIS — R531 Weakness: Secondary | ICD-10-CM | POA: Diagnosis not present

## 2021-05-07 DIAGNOSIS — I509 Heart failure, unspecified: Secondary | ICD-10-CM | POA: Diagnosis not present

## 2021-05-07 DIAGNOSIS — U071 COVID-19: Secondary | ICD-10-CM | POA: Diagnosis not present

## 2021-05-07 DIAGNOSIS — E119 Type 2 diabetes mellitus without complications: Secondary | ICD-10-CM | POA: Diagnosis not present

## 2021-05-07 DIAGNOSIS — I110 Hypertensive heart disease with heart failure: Secondary | ICD-10-CM | POA: Diagnosis not present

## 2021-05-10 DIAGNOSIS — R5383 Other fatigue: Secondary | ICD-10-CM | POA: Diagnosis not present

## 2021-05-10 DIAGNOSIS — J22 Unspecified acute lower respiratory infection: Secondary | ICD-10-CM | POA: Diagnosis not present

## 2021-05-10 DIAGNOSIS — R059 Cough, unspecified: Secondary | ICD-10-CM | POA: Diagnosis not present

## 2021-05-10 DIAGNOSIS — U099 Post covid-19 condition, unspecified: Secondary | ICD-10-CM | POA: Diagnosis not present

## 2021-05-14 DIAGNOSIS — J479 Bronchiectasis, uncomplicated: Secondary | ICD-10-CM | POA: Diagnosis not present

## 2021-05-14 DIAGNOSIS — U071 COVID-19: Secondary | ICD-10-CM | POA: Diagnosis not present

## 2021-05-14 DIAGNOSIS — I110 Hypertensive heart disease with heart failure: Secondary | ICD-10-CM | POA: Diagnosis not present

## 2021-05-14 DIAGNOSIS — E119 Type 2 diabetes mellitus without complications: Secondary | ICD-10-CM | POA: Diagnosis not present

## 2021-05-14 DIAGNOSIS — I509 Heart failure, unspecified: Secondary | ICD-10-CM | POA: Diagnosis not present

## 2021-05-14 DIAGNOSIS — R531 Weakness: Secondary | ICD-10-CM | POA: Diagnosis not present

## 2021-05-15 DIAGNOSIS — Z23 Encounter for immunization: Secondary | ICD-10-CM | POA: Diagnosis not present

## 2021-05-15 DIAGNOSIS — L570 Actinic keratosis: Secondary | ICD-10-CM | POA: Diagnosis not present

## 2021-05-15 DIAGNOSIS — L309 Dermatitis, unspecified: Secondary | ICD-10-CM | POA: Diagnosis not present

## 2021-05-16 DIAGNOSIS — L309 Dermatitis, unspecified: Secondary | ICD-10-CM | POA: Diagnosis not present

## 2021-05-16 DIAGNOSIS — L219 Seborrheic dermatitis, unspecified: Secondary | ICD-10-CM | POA: Diagnosis not present

## 2021-05-17 DIAGNOSIS — R531 Weakness: Secondary | ICD-10-CM | POA: Diagnosis not present

## 2021-05-17 DIAGNOSIS — E119 Type 2 diabetes mellitus without complications: Secondary | ICD-10-CM | POA: Diagnosis not present

## 2021-05-17 DIAGNOSIS — J479 Bronchiectasis, uncomplicated: Secondary | ICD-10-CM | POA: Diagnosis not present

## 2021-05-17 DIAGNOSIS — I509 Heart failure, unspecified: Secondary | ICD-10-CM | POA: Diagnosis not present

## 2021-05-17 DIAGNOSIS — U071 COVID-19: Secondary | ICD-10-CM | POA: Diagnosis not present

## 2021-05-17 DIAGNOSIS — I110 Hypertensive heart disease with heart failure: Secondary | ICD-10-CM | POA: Diagnosis not present

## 2021-05-23 DIAGNOSIS — R531 Weakness: Secondary | ICD-10-CM | POA: Diagnosis not present

## 2021-05-23 DIAGNOSIS — I509 Heart failure, unspecified: Secondary | ICD-10-CM | POA: Diagnosis not present

## 2021-05-23 DIAGNOSIS — J479 Bronchiectasis, uncomplicated: Secondary | ICD-10-CM | POA: Diagnosis not present

## 2021-05-23 DIAGNOSIS — I110 Hypertensive heart disease with heart failure: Secondary | ICD-10-CM | POA: Diagnosis not present

## 2021-05-23 DIAGNOSIS — E119 Type 2 diabetes mellitus without complications: Secondary | ICD-10-CM | POA: Diagnosis not present

## 2021-05-23 DIAGNOSIS — U071 COVID-19: Secondary | ICD-10-CM | POA: Diagnosis not present

## 2021-05-24 DIAGNOSIS — J479 Bronchiectasis, uncomplicated: Secondary | ICD-10-CM | POA: Diagnosis not present

## 2021-05-24 DIAGNOSIS — R059 Cough, unspecified: Secondary | ICD-10-CM | POA: Diagnosis not present

## 2021-05-28 ENCOUNTER — Encounter: Payer: Self-pay | Admitting: Internal Medicine

## 2021-05-28 NOTE — Assessment & Plan Note (Addendum)
Intolerant of triple therapy.  Emphasis on airway clearance with flutter device and prevention of aspiration. Plan-Augmentin

## 2021-05-28 NOTE — Assessment & Plan Note (Signed)
This flares periodically and is aggravated by chronic cough.  She has had injections but is not considered a surgical candidate due to her age and poor overall health.

## 2021-05-31 DIAGNOSIS — H547 Unspecified visual loss: Secondary | ICD-10-CM | POA: Diagnosis not present

## 2021-05-31 DIAGNOSIS — J479 Bronchiectasis, uncomplicated: Secondary | ICD-10-CM | POA: Diagnosis not present

## 2021-05-31 DIAGNOSIS — I110 Hypertensive heart disease with heart failure: Secondary | ICD-10-CM | POA: Diagnosis not present

## 2021-05-31 DIAGNOSIS — E119 Type 2 diabetes mellitus without complications: Secondary | ICD-10-CM | POA: Diagnosis not present

## 2021-05-31 DIAGNOSIS — I700 Atherosclerosis of aorta: Secondary | ICD-10-CM | POA: Diagnosis not present

## 2021-05-31 DIAGNOSIS — I509 Heart failure, unspecified: Secondary | ICD-10-CM | POA: Diagnosis not present

## 2021-05-31 DIAGNOSIS — K219 Gastro-esophageal reflux disease without esophagitis: Secondary | ICD-10-CM | POA: Diagnosis not present

## 2021-05-31 DIAGNOSIS — G4700 Insomnia, unspecified: Secondary | ICD-10-CM | POA: Diagnosis not present

## 2021-05-31 DIAGNOSIS — D649 Anemia, unspecified: Secondary | ICD-10-CM | POA: Diagnosis not present

## 2021-05-31 DIAGNOSIS — Z7984 Long term (current) use of oral hypoglycemic drugs: Secondary | ICD-10-CM | POA: Diagnosis not present

## 2021-05-31 DIAGNOSIS — H3530 Unspecified macular degeneration: Secondary | ICD-10-CM | POA: Diagnosis not present

## 2021-05-31 DIAGNOSIS — U071 COVID-19: Secondary | ICD-10-CM | POA: Diagnosis not present

## 2021-05-31 DIAGNOSIS — I951 Orthostatic hypotension: Secondary | ICD-10-CM | POA: Diagnosis not present

## 2021-05-31 DIAGNOSIS — R531 Weakness: Secondary | ICD-10-CM | POA: Diagnosis not present

## 2021-06-04 DIAGNOSIS — R5383 Other fatigue: Secondary | ICD-10-CM | POA: Diagnosis not present

## 2021-06-04 DIAGNOSIS — N3090 Cystitis, unspecified without hematuria: Secondary | ICD-10-CM | POA: Diagnosis not present

## 2021-06-04 DIAGNOSIS — M25512 Pain in left shoulder: Secondary | ICD-10-CM | POA: Diagnosis not present

## 2021-06-04 DIAGNOSIS — I10 Essential (primary) hypertension: Secondary | ICD-10-CM | POA: Diagnosis not present

## 2021-06-04 DIAGNOSIS — R300 Dysuria: Secondary | ICD-10-CM | POA: Diagnosis not present

## 2021-06-07 DIAGNOSIS — I509 Heart failure, unspecified: Secondary | ICD-10-CM | POA: Diagnosis not present

## 2021-06-07 DIAGNOSIS — I110 Hypertensive heart disease with heart failure: Secondary | ICD-10-CM | POA: Diagnosis not present

## 2021-06-07 DIAGNOSIS — U071 COVID-19: Secondary | ICD-10-CM | POA: Diagnosis not present

## 2021-06-07 DIAGNOSIS — R531 Weakness: Secondary | ICD-10-CM | POA: Diagnosis not present

## 2021-06-07 DIAGNOSIS — E119 Type 2 diabetes mellitus without complications: Secondary | ICD-10-CM | POA: Diagnosis not present

## 2021-06-07 DIAGNOSIS — J479 Bronchiectasis, uncomplicated: Secondary | ICD-10-CM | POA: Diagnosis not present

## 2021-06-13 DIAGNOSIS — J479 Bronchiectasis, uncomplicated: Secondary | ICD-10-CM | POA: Diagnosis not present

## 2021-06-13 DIAGNOSIS — I110 Hypertensive heart disease with heart failure: Secondary | ICD-10-CM | POA: Diagnosis not present

## 2021-06-13 DIAGNOSIS — I509 Heart failure, unspecified: Secondary | ICD-10-CM | POA: Diagnosis not present

## 2021-06-13 DIAGNOSIS — R531 Weakness: Secondary | ICD-10-CM | POA: Diagnosis not present

## 2021-06-13 DIAGNOSIS — E119 Type 2 diabetes mellitus without complications: Secondary | ICD-10-CM | POA: Diagnosis not present

## 2021-06-13 DIAGNOSIS — U071 COVID-19: Secondary | ICD-10-CM | POA: Diagnosis not present

## 2021-06-15 DIAGNOSIS — G720 Drug-induced myopathy: Secondary | ICD-10-CM | POA: Diagnosis not present

## 2021-06-15 DIAGNOSIS — E871 Hypo-osmolality and hyponatremia: Secondary | ICD-10-CM | POA: Diagnosis not present

## 2021-06-15 DIAGNOSIS — N3090 Cystitis, unspecified without hematuria: Secondary | ICD-10-CM | POA: Diagnosis not present

## 2021-06-15 DIAGNOSIS — K5900 Constipation, unspecified: Secondary | ICD-10-CM | POA: Diagnosis not present

## 2021-06-15 DIAGNOSIS — I10 Essential (primary) hypertension: Secondary | ICD-10-CM | POA: Diagnosis not present

## 2021-06-15 DIAGNOSIS — F411 Generalized anxiety disorder: Secondary | ICD-10-CM | POA: Diagnosis not present

## 2021-06-15 DIAGNOSIS — E46 Unspecified protein-calorie malnutrition: Secondary | ICD-10-CM | POA: Diagnosis not present

## 2021-06-15 DIAGNOSIS — I700 Atherosclerosis of aorta: Secondary | ICD-10-CM | POA: Diagnosis not present

## 2021-06-15 DIAGNOSIS — R233 Spontaneous ecchymoses: Secondary | ICD-10-CM | POA: Diagnosis not present

## 2021-06-15 DIAGNOSIS — E1149 Type 2 diabetes mellitus with other diabetic neurological complication: Secondary | ICD-10-CM | POA: Diagnosis not present

## 2021-06-16 ENCOUNTER — Other Ambulatory Visit: Payer: Self-pay | Admitting: Internal Medicine

## 2021-06-19 DIAGNOSIS — J479 Bronchiectasis, uncomplicated: Secondary | ICD-10-CM | POA: Diagnosis not present

## 2021-06-19 DIAGNOSIS — U071 COVID-19: Secondary | ICD-10-CM | POA: Diagnosis not present

## 2021-06-19 DIAGNOSIS — I110 Hypertensive heart disease with heart failure: Secondary | ICD-10-CM | POA: Diagnosis not present

## 2021-06-19 DIAGNOSIS — R531 Weakness: Secondary | ICD-10-CM | POA: Diagnosis not present

## 2021-06-19 DIAGNOSIS — E119 Type 2 diabetes mellitus without complications: Secondary | ICD-10-CM | POA: Diagnosis not present

## 2021-06-19 DIAGNOSIS — I509 Heart failure, unspecified: Secondary | ICD-10-CM | POA: Diagnosis not present

## 2021-06-22 NOTE — Progress Notes (Unsigned)
HPI F never smoker, followed for bronchiectasis/ chronic bronchitis/ MAIC, complicated by hx of GERD, DM , arthritis, aortic atherosclerosis Sputum Cx 06/09/12 POS MAIC  Started 07/14/12- Biaxin 500mg  TIW, EMB 1200 TIW, Rifabutin 300 TIW  ended- 08/12/12 due to weakness and malaise,  CT chest 06/01/12- Progressive bilateral nodular bronchiectasis Office spirometry  01/18/13- moderate obstructive airways disease-FVC 2.10/69%, FEV1 1.25/58%,  FEV1/FVC  0.59/ 84%,  FEF 25-75% 0.47/33% --------------------------------------------------------------------------------   02/21/21-  88 year old female never smoker followed for Bronchiectasis/chronic bronchitis/MAIC, complicated by history GERD, DM2, arthritis,hyponatremia, aortic atherosclerosis, Lumbar DDD/ back pain, HTN, dCHF, Maular Degenration,  -Ventolin hfa, neb Perforomist/ Pulmicort/ Atrovent 0.02%, tessalon, tramadol,  Covid vax-2 Phizer Flu vax-had Hosp 9/12-16/22- CAP-Multifocal on CT-Rx'd Rocephin, Zith, then augmentin -----C/o cough-clear, increased sob Recovered slowly from recent pneumonia.  Continues significantly limited by poor eyesight, back pain and debilitation. We discussed her CT scan from September.  Bronchiectasis assumed to reflect chronic microaspiration with appropriate discussion and education again done.  She high risk helpers and resists invitation to move in with her daughter. CTchest- 01/02/21- IMPRESSION: 1. Increased reticulonodular opacities compatible with bronchitis exacerbation/bronchopneumonia. 2. Background of bronchiectasis which has progressed from 2020. 3. Nonacute appearing right twelfth rib fracture. Partially covered and age-indeterminate right eleventh rib fracture. 4.  Aortic Atherosclerosis (ICD10-I70.0).  06/25/21- 88 year old female never smoker followed for Bronchiectasis/chronic bronchitis/MAIC, complicated by history GERD, DM2, arthritis,hyponatremia, aortic atherosclerosis, Lumbar DDD/ back pain,  HTN, dCHF, Macular Degeneration, Covid infection [id number],  -Ventolin hfa, neb Perforomist/ Pulmicort/ Atrovent 0.02%, tessalon, tramadol, guaifenesicodeine,  Covid vax-2 Phizer Flu vax-had Hosp 1/2-1/4 Covid infection -----States she feels more sob, weakness   CXR 04/23/21- IMPRESSION: 1. No convincing acute cardiopulmonary disease. 2. Chronic lung findings that reflect bronchiectasis, mucous plugging and interstitial thickening that are without change, better assessed on the CT dated 01/02/2021.   ROS-see HPI + = positive Constitutional:     +weight loss, night sweats, no-fevers, chills,  +fatigue, lassitude. HEENT:   No-  headaches, difficulty swallowing, +tooth/dental problems, no-sore throat,       No-  sneezing, itching, ear ache, +nasal congestion, post nasal drip, + declining eyesight CV:  + chest pain, orthopnea, PND, swelling in lower extremities, anasarca,  dizziness, palpitations Resp: +Mild  shortness of breath with exertion or at rest.             +productive cough,  + non-productive cough,  No- coughing up of blood.              No-   change in color of mucus.  No- wheezing.   Skin: No-   rash or lesions. GI:  No-   heartburn, indigestion, abdominal pain, nausea, vomiting,  GU:  MS:  +Active low back and bilateral hip pain. L rib and L shoulder pain Neuro-     complaint of generalized weakness without myalgias Psych:  No- change in mood or affect. No depression or anxiety.  No memory loss.  Obj- Physical exam General- Alert, Oriented, Frail, NAD, gracious woman, trying to be stoic,  Skin- rash-none,  excoriation- none Lymphadenopathy- none Head- atraumatic            Eyes- + decreased vision            Ears- Hearing, canals-normal            Nose- Clear, no-Septal dev, mucus, polyps, erosion, perforation             Throat- Mallampati II , mucosa clear , drainage- none, tonsils-  atrophic Neck- flexible , trachea midline, no stridor , thyroid nl, carotid no  bruit Chest - symmetrical excursion , unlabored           Heart/CV- RRR , no murmur , no gallop  , no rub, nl s1 s2                           - JVD- none , edema- none, stasis changes- none, varices- none           Lung-   Cough+ with deep breath, + few rhonchi L back, no wheeze,  dullness-none, rub-none. + no crepitus Chest wall-  Abd-  Br/ Gen/ Rectal- Not done, not indicated Extrem-   No-clubbing, none, atrophy- none, strength- nl for age. + Cool/dusky feet Neuro- grossly intact to observation

## 2021-06-25 ENCOUNTER — Encounter: Payer: Self-pay | Admitting: Internal Medicine

## 2021-06-25 ENCOUNTER — Other Ambulatory Visit: Payer: Self-pay

## 2021-06-25 ENCOUNTER — Ambulatory Visit (INDEPENDENT_AMBULATORY_CARE_PROVIDER_SITE_OTHER): Payer: Medicare Other | Admitting: Internal Medicine

## 2021-06-25 DIAGNOSIS — J471 Bronchiectasis with (acute) exacerbation: Secondary | ICD-10-CM

## 2021-06-25 DIAGNOSIS — R42 Dizziness and giddiness: Secondary | ICD-10-CM | POA: Diagnosis not present

## 2021-06-25 MED ORDER — AMOXICILLIN-POT CLAVULANATE 500-125 MG PO TABS: 1.0000 | ORAL_TABLET | Freq: Three times a day (TID) | ORAL | 0 refills | Status: DC

## 2021-06-25 NOTE — Patient Instructions (Addendum)
Script sent for augmentin antibiotic ? ?Keep using your regular breathing medicines ? ?Try to use your Flutter device a couple of times a day to help clear the mucus. ? ?Please call as needed ? ? ?

## 2021-06-27 DIAGNOSIS — U071 COVID-19: Secondary | ICD-10-CM | POA: Diagnosis not present

## 2021-06-27 DIAGNOSIS — R531 Weakness: Secondary | ICD-10-CM | POA: Diagnosis not present

## 2021-06-27 DIAGNOSIS — I509 Heart failure, unspecified: Secondary | ICD-10-CM | POA: Diagnosis not present

## 2021-06-27 DIAGNOSIS — E119 Type 2 diabetes mellitus without complications: Secondary | ICD-10-CM | POA: Diagnosis not present

## 2021-06-27 DIAGNOSIS — J479 Bronchiectasis, uncomplicated: Secondary | ICD-10-CM | POA: Diagnosis not present

## 2021-06-27 DIAGNOSIS — I110 Hypertensive heart disease with heart failure: Secondary | ICD-10-CM | POA: Diagnosis not present

## 2021-07-04 ENCOUNTER — Other Ambulatory Visit: Payer: Self-pay | Admitting: Internal Medicine

## 2021-07-05 DIAGNOSIS — R3915 Urgency of urination: Secondary | ICD-10-CM | POA: Diagnosis not present

## 2021-07-05 DIAGNOSIS — R300 Dysuria: Secondary | ICD-10-CM | POA: Diagnosis not present

## 2021-07-05 DIAGNOSIS — M79606 Pain in leg, unspecified: Secondary | ICD-10-CM | POA: Diagnosis not present

## 2021-07-05 DIAGNOSIS — N3090 Cystitis, unspecified without hematuria: Secondary | ICD-10-CM | POA: Diagnosis not present

## 2021-07-05 NOTE — Telephone Encounter (Signed)
Ipratropium neb solution refilled ?

## 2021-07-11 ENCOUNTER — Encounter: Payer: Self-pay | Admitting: Internal Medicine

## 2021-07-11 DIAGNOSIS — E119 Type 2 diabetes mellitus without complications: Secondary | ICD-10-CM | POA: Diagnosis not present

## 2021-07-11 DIAGNOSIS — I10 Essential (primary) hypertension: Secondary | ICD-10-CM | POA: Diagnosis not present

## 2021-07-11 NOTE — Assessment & Plan Note (Signed)
Likely multifactorial but she is to work closely with her other physicians watching for arrhythmia, orthostatic instability and progression of her age-related weakness and arthritis.  I would feel better if she would accept family invitation to come and live with them but she refuses. ?

## 2021-07-11 NOTE — Assessment & Plan Note (Signed)
We will try to manage the pulmonary component of this is an acute exacerbation of her known bronchiectasis ?Plan-Augmentin, regular use of flutter valve now ?

## 2021-08-16 DIAGNOSIS — R531 Weakness: Secondary | ICD-10-CM | POA: Diagnosis not present

## 2021-08-16 DIAGNOSIS — R0602 Shortness of breath: Secondary | ICD-10-CM | POA: Diagnosis not present

## 2021-08-22 ENCOUNTER — Other Ambulatory Visit: Payer: Self-pay

## 2021-08-22 ENCOUNTER — Emergency Department (HOSPITAL_COMMUNITY)
Admission: EM | Admit: 2021-08-22 | Discharge: 2021-08-22 | Disposition: A | Discharge status: Home / Self Care | Payer: Medicare Other | Attending: Emergency Medicine | Admitting: Emergency Medicine

## 2021-08-22 ENCOUNTER — Encounter (HOSPITAL_COMMUNITY): Payer: Self-pay

## 2021-08-22 ENCOUNTER — Emergency Department (HOSPITAL_COMMUNITY): Payer: Medicare Other

## 2021-08-22 DIAGNOSIS — E860 Dehydration: Secondary | ICD-10-CM | POA: Insufficient documentation

## 2021-08-22 DIAGNOSIS — Z20822 Contact with and (suspected) exposure to covid-19: Secondary | ICD-10-CM | POA: Diagnosis not present

## 2021-08-22 DIAGNOSIS — R0602 Shortness of breath: Secondary | ICD-10-CM | POA: Insufficient documentation

## 2021-08-22 DIAGNOSIS — Z7982 Long term (current) use of aspirin: Secondary | ICD-10-CM | POA: Diagnosis not present

## 2021-08-22 DIAGNOSIS — R531 Weakness: Secondary | ICD-10-CM | POA: Insufficient documentation

## 2021-08-22 DIAGNOSIS — R062 Wheezing: Secondary | ICD-10-CM | POA: Diagnosis not present

## 2021-08-22 DIAGNOSIS — R0902 Hypoxemia: Secondary | ICD-10-CM | POA: Diagnosis not present

## 2021-08-22 DIAGNOSIS — M549 Dorsalgia, unspecified: Secondary | ICD-10-CM | POA: Diagnosis not present

## 2021-08-22 DIAGNOSIS — I959 Hypotension, unspecified: Secondary | ICD-10-CM | POA: Diagnosis not present

## 2021-08-22 LAB — COMPREHENSIVE METABOLIC PANEL
ALT: 12 U/L (ref 0–44)
AST: 14 U/L — ABNORMAL LOW (ref 15–41)
Albumin: 3.4 g/dL — ABNORMAL LOW (ref 3.5–5.0)
Alkaline Phosphatase: 60 U/L (ref 38–126)
Anion gap: 10 (ref 5–15)
BUN: 22 mg/dL (ref 8–23)
CO2: 28 mmol/L (ref 22–32)
Calcium: 9 mg/dL (ref 8.9–10.3)
Chloride: 94 mmol/L — ABNORMAL LOW (ref 98–111)
Creatinine, Ser: 0.85 mg/dL (ref 0.44–1.00)
GFR, Estimated: >60 mL/min (ref >60)
Glucose, Bld: 161 mg/dL — ABNORMAL HIGH (ref 70–99)
Potassium: 3.4 mmol/L — ABNORMAL LOW (ref 3.5–5.1)
Sodium: 132 mmol/L — ABNORMAL LOW (ref 135–145)
Total Bilirubin: 0.6 mg/dL (ref 0.3–1.2)
Total Protein: 6.4 g/dL — ABNORMAL LOW (ref 6.5–8.1)

## 2021-08-22 LAB — URINALYSIS, ROUTINE W REFLEX MICROSCOPIC
Bilirubin Urine: NEGATIVE
Glucose, UA: NEGATIVE mg/dL
Hgb urine dipstick: NEGATIVE
Ketones, ur: NEGATIVE mg/dL
Nitrite: NEGATIVE
Protein, ur: NEGATIVE mg/dL
Specific Gravity, Urine: 1.006 (ref 1.005–1.030)
pH: 7 (ref 5.0–8.0)

## 2021-08-22 LAB — CBC WITH DIFFERENTIAL/PLATELET
Abs Immature Granulocytes: 0.06 10*3/uL (ref 0.00–0.07)
Basophils Absolute: 0.1 10*3/uL (ref 0.0–0.1)
Basophils Relative: 1 %
Eosinophils Absolute: 0.6 10*3/uL — ABNORMAL HIGH (ref 0.0–0.5)
Eosinophils Relative: 5 %
HCT: 34.1 % — ABNORMAL LOW (ref 36.0–46.0)
Hemoglobin: 11.1 g/dL — ABNORMAL LOW (ref 12.0–15.0)
Immature Granulocytes: 1 %
Lymphocytes Relative: 12 %
Lymphs Abs: 1.3 10*3/uL (ref 0.7–4.0)
MCH: 28.4 pg (ref 26.0–34.0)
MCHC: 32.6 g/dL (ref 30.0–36.0)
MCV: 87.2 fL (ref 80.0–100.0)
Monocytes Absolute: 1.5 10*3/uL — ABNORMAL HIGH (ref 0.1–1.0)
Monocytes Relative: 14 %
Neutro Abs: 7.3 10*3/uL (ref 1.7–7.7)
Neutrophils Relative %: 67 %
Platelets: 299 10*3/uL (ref 150–400)
RBC: 3.91 MIL/uL (ref 3.87–5.11)
RDW: 13.3 % (ref 11.5–15.5)
WBC: 10.8 10*3/uL — ABNORMAL HIGH (ref 4.0–10.5)
nRBC: 0 % (ref 0.0–0.2)

## 2021-08-22 LAB — RESP PANEL BY RT-PCR (FLU A&B, COVID) ARPGX2
Influenza A by PCR: NEGATIVE
Influenza B by PCR: NEGATIVE
SARS Coronavirus 2 by RT PCR: NEGATIVE

## 2021-08-22 MED ORDER — IPRATROPIUM-ALBUTEROL 0.5-2.5 (3) MG/3ML IN SOLN
3.0000 mL | Freq: Once | RESPIRATORY_TRACT | Status: AC
  Administered 2021-08-22: 3 mL via RESPIRATORY_TRACT
  Filled 2021-08-22: qty 3

## 2021-08-22 MED ORDER — SODIUM CHLORIDE 0.9 % IV BOLUS
500.0000 mL | Freq: Once | INTRAVENOUS | Status: AC
  Administered 2021-08-22: 500 mL via INTRAVENOUS

## 2021-08-22 MED ORDER — ACETAMINOPHEN 500 MG PO TABS
1000.0000 mg | ORAL_TABLET | Freq: Once | ORAL | Status: AC
  Administered 2021-08-22: 1000 mg via ORAL
  Filled 2021-08-22: qty 2

## 2021-08-22 MED ORDER — CEPHALEXIN 500 MG PO CAPS: 500.0000 mg | ORAL_CAPSULE | Freq: Four times a day (QID) | ORAL | 0 refills | Status: DC

## 2021-08-22 MED ORDER — CEPHALEXIN 500 MG PO CAPS
500.0000 mg | ORAL_CAPSULE | Freq: Once | ORAL | Status: AC
  Administered 2021-08-22: 500 mg via ORAL
  Filled 2021-08-22: qty 1

## 2021-08-22 NOTE — ED Provider Notes (Signed)
?[facility] DEPT ?Provider Note ? ? ?CSN: [id number] ?Arrival date & time: 08/22/21  1039 ? ?  ? ?History ? ?Chief Complaint  ?Patient presents with  ? Weakness  ? ? ?Katie Alvarado is a 88 y.o. female. ? ?87 year old female with prior medical history as detailed below presents for evaluation.  Patient complains of gradually worsening weakness over the last 3 weeks.  She denies fever.  She complains of increased generalized weakness.  She reports difficulty ambulating - even with assistance of her walker. ? ?She denies fever. ? ?Patient reports that approximately 1-1/2 weeks ago she was given a short course of antibiotics and prednisone by her PCP for treatment of a possible COPD exacerbation.  During that period the steroids increased her sugars.  She reports her sugars have now returned to normal. ? ?She denies urinary symptoms.  She denies abdominal pain.   ? ?She reports continued wheezing and mild shortness of breath. ? ?The history is provided by the patient and medical records.  ?Weakness ?Severity:  Mild ?Onset quality:  Gradual ?Duration:  3 weeks ?Timing:  Constant ?Progression:  Worsening ?Chronicity:  New ? ?  ? ?Home Medications ?Prior to Admission medications   ?Medication Sig Start Date End Date Taking? Authorizing Provider  ?acetaminophen (TYLENOL) 325 MG tablet Take 650 mg by mouth every 4 (four) hours as needed for mild pain.    [provider]  ?acetaminophen-codeine (TYLENOL #2) 300-15 MG tablet 1 every 6 hours if needed for pain or cough ?Patient not taking: Reported on 06/25/2021 02/21/21   Baird Lyons D, MD  ?albuterol (VENTOLIN HFA) 108 (90 Base) MCG/ACT inhaler INHALE 2 PUFFS INTO THE LUNGS EVERY 6 HOURS AS NEEDED FOR WHEEZING OR SHORTNESS OF BREATH 06/18/21   Baird Lyons D, MD  ?ALPRAZolam Duanne Moron) 0.25 MG tablet Take 0.25 mg by mouth 3 (three) times daily as needed for anxiety or sleep.    [provider]  ?amoxicillin-clavulanate (AUGMENTIN)  500-125 MG tablet Take 1 tablet (500 mg total) by mouth 3 (three) times daily. 06/25/21   Baird Lyons D, MD  ?aspirin EC 81 MG tablet Take 81 mg by mouth daily. Swallow whole.    [provider]  ?benzonatate (TESSALON) 200 MG capsule Take 1 capsule (200 mg total) by mouth 3 (three) times daily as needed for cough. 02/21/21   Baird Lyons D, MD  ?budesonide (PULMICORT) 0.25 MG/2ML nebulizer solution USE 1 VIAL  IN  NEBULIZER TWICE  DAILY - rinse mouth after treatment ?Patient taking differently: Take 0.25 mg by nebulization daily. 06/30/20   Baird Lyons D, MD  ?carboxymethylcellulose (REFRESH PLUS) 0.5 % SOLN Place 1 drop into both eyes as needed (dry eyes).    [provider]  ?carvedilol (COREG) 6.25 MG tablet Take 6.25 mg by mouth 2 (two) times daily with a meal.    [provider]  ?cholecalciferol (VITAMIN D) 1000 UNITS tablet Take 2,000 Units by mouth daily.    [provider]  ?Cinnamon 500 MG capsule Take 1,000 mg by mouth 2 (two) times daily as needed (for sugar levels).  ?Patient not taking: Reported on 06/25/2021    [provider]  ?clobetasol (TEMOVATE) 0.05 % external solution Apply 1 application topically daily as needed for irritation. 09/12/20   [provider]  ?esomeprazole (NEXIUM) 40 MG capsule Take 40 mg by mouth daily.    [provider]  ?fluocinolone (SYNALAR) 0.01 % external solution Apply 1 application topically 2 (two) times  daily as needed for irritation. ?Patient not taking: Reported on 06/25/2021 04/13/21   [provider]  ?furosemide (LASIX) 20 MG tablet Take 20 mg by mouth daily.    [provider]  ?glipiZIDE (GLUCOTROL) 5 MG tablet Take 5 mg by mouth every morning. 02/14/21   [provider]  ?guaiFENesin-codeine 100-10 MG/5ML syrup Take 2.5 mLs by mouth every 6 (six) hours as needed for cough. ?Patient not taking: Reported on 06/25/2021 01/05/21   Caren Griffins, MD  ?hydrALAZINE (APRESOLINE)  50 MG tablet Take 50 mg by mouth 3 (three) times daily. 03/12/21   [provider]  ?ipratropium (ATROVENT) 0.02 % nebulizer solution USE 1 VIAL IN NEBULIZER 4 TIMES DAILY 07/05/21   Baird Lyons D, MD  ?irbesartan (AVAPRO) 300 MG tablet TAKE 1 TABLET BY MOUTH EVERY DAY ?Patient taking differently: Take 300 mg by mouth daily. 03/29/21   Jerline Pain, MD  ?ketoconazole (NIZORAL) 2 % shampoo Apply 1 application topically See admin instructions. Apply to scalp 2 to 3 times per week. 09/25/20   [provider]  ?meclizine (ANTIVERT) 12.5 MG tablet Take 12.5 mg by mouth 3 (three) times daily as needed for dizziness.    [provider]  ?Multiple Vitamins-Minerals (PRESERVISION AREDS 2 PO) Take 1 tablet by mouth in the morning and at bedtime.    [provider]  ?ondansetron (ZOFRAN) 4 MG tablet Take 4 mg by mouth every 6 (six) hours as needed for nausea or vomiting. 10/11/20   [provider]  ?polyvinyl alcohol (LIQUIFILM TEARS) 1.4 % ophthalmic solution Place 1 drop into both eyes as needed for dry eyes.    [provider]  ?Respiratory Therapy Supplies (FLUTTER) DEVI Use as directed 11/03/15   Baird Lyons D, MD  ?vitamin E 400 UNIT capsule Take 400 Units by mouth 2 (two) times a week.    [provider]  ?   ? ?Allergies    ?Welchol [colesevelam hcl], Amlodipine, Atorvastatin, Coreg [carvedilol], Doxycycline, Glucosamine, Hydrocodone-acetaminophen, Mobic [meloxicam], Sulfa antibiotics, Telmisartan, Tramadol, Celecoxib, and Statins   ? ?Review of Systems   ?Review of Systems  ?Neurological:  Positive for weakness.  ?All other systems reviewed and are negative. ? ?Physical Exam ?Updated Vital Signs ?BP (!) 186/71 (BP Location: Right Arm)   Pulse 77   Temp 97.6 ?F (36.4 ?C)   Resp (!) 22   Ht '5\' 8"'$  (1.727 m)   Wt 57 kg   SpO2 93%   BMI 19.11 kg/m?  ?Physical Exam ?Vitals and nursing note reviewed.  ?Constitutional:   ?   General: She is not in acute  distress. ?   Appearance: Normal appearance. She is well-developed.  ?HENT:  ?   Head: Normocephalic and atraumatic.  ?Eyes:  ?   Conjunctiva/sclera: Conjunctivae normal.  ?   Pupils: Pupils are equal, round, and reactive to light.  ?Cardiovascular:  ?   Rate and Rhythm: Normal rate and regular rhythm.  ?   Heart sounds: Normal heart sounds.  ?Pulmonary:  ?   Effort: Pulmonary effort is normal. No respiratory distress.  ?   Comments: Trace diffuse expiratory wheezes bilaterally ?Abdominal:  ?   General: There is no distension.  ?   Palpations: Abdomen is soft.  ?   Tenderness: There is no abdominal tenderness.  ?Musculoskeletal:     ?   General: No deformity. Normal range of motion.  ?   Cervical back: Normal range of motion and neck supple.  ?Skin: ?  General: Skin is warm and dry.  ?Neurological:  ?   General: No focal deficit present.  ?   Mental Status: She is alert and oriented to person, place, and time.  ? ? ?ED Results / Procedures / Treatments   ?Labs ?(all labs ordered are listed, but only abnormal results are displayed) ?Labs Reviewed  ?RESP PANEL BY RT-PCR (FLU A&B, COVID) [id number]  ?URINE CULTURE  ?URINALYSIS, ROUTINE W REFLEX MICROSCOPIC  ?COMPREHENSIVE METABOLIC PANEL  ?CBC WITH DIFFERENTIAL/PLATELET  ? ? ?EKG ?EKG Interpretation ? ?Date/Time:  Wednesday Aug 22 2021 10:48:17 EDT ?Ventricular Rate:  77 ?PR Interval:  189 ?QRS Duration: 92 ?QT Interval:  371 ?QTC Calculation: 420 ?R Axis:   -9 ?Text Interpretation: Sinus rhythm Atrial premature complex Abnormal R-wave progression, early transition Probable anteroseptal infarct, old Minimal ST depression Confirmed by Dene Gentry [phone]) on 08/22/2021 11:11:41 AM ? ?Radiology ?No results found. ? ?Procedures ?Procedures  ? ? ?Medications Ordered in ED ?Medications  ?ipratropium-albuterol (DUONEB) 0.5-2.5 (3) MG/3ML nebulizer solution 3 mL (has no administration in time range)  ?sodium chloride 0.9 % bolus 500 mL (500 mLs Intravenous New Bag/Given 08/22/21  1106)  ? ? ?ED Course/ Medical Decision Making/ A&P ?  ?                        ?Medical Decision Making ?Amount and/or Complexity of Data Reviewed ?Labs: ordered. ?Radiology: ordered. ? ?Risk ?OTC drugs. ?Pr

## 2021-08-22 NOTE — ED Triage Notes (Signed)
Pt BIB EMS from home pt c/o lower back pain and weakness x 3 weeks.  ?150/60 ?72HR ?20 RR ?90% ?185 bgl ?

## 2021-08-22 NOTE — Discharge Instructions (Addendum)
Return for any problem.  ?

## 2021-08-23 DIAGNOSIS — M1710 Unilateral primary osteoarthritis, unspecified knee: Secondary | ICD-10-CM | POA: Diagnosis not present

## 2021-08-23 DIAGNOSIS — E119 Type 2 diabetes mellitus without complications: Secondary | ICD-10-CM | POA: Diagnosis not present

## 2021-08-23 DIAGNOSIS — I10 Essential (primary) hypertension: Secondary | ICD-10-CM | POA: Diagnosis not present

## 2021-08-23 LAB — URINE CULTURE

## 2021-08-27 DIAGNOSIS — Z7951 Long term (current) use of inhaled steroids: Secondary | ICD-10-CM | POA: Diagnosis not present

## 2021-08-27 DIAGNOSIS — K219 Gastro-esophageal reflux disease without esophagitis: Secondary | ICD-10-CM | POA: Diagnosis not present

## 2021-08-27 DIAGNOSIS — Z8709 Personal history of other diseases of the respiratory system: Secondary | ICD-10-CM | POA: Diagnosis not present

## 2021-08-27 DIAGNOSIS — H548 Legal blindness, as defined in USA: Secondary | ICD-10-CM | POA: Diagnosis not present

## 2021-08-27 DIAGNOSIS — G4700 Insomnia, unspecified: Secondary | ICD-10-CM | POA: Diagnosis not present

## 2021-08-27 DIAGNOSIS — D509 Iron deficiency anemia, unspecified: Secondary | ICD-10-CM | POA: Diagnosis not present

## 2021-08-27 DIAGNOSIS — Z7982 Long term (current) use of aspirin: Secondary | ICD-10-CM | POA: Diagnosis not present

## 2021-08-27 DIAGNOSIS — Z79899 Other long term (current) drug therapy: Secondary | ICD-10-CM | POA: Diagnosis not present

## 2021-08-27 DIAGNOSIS — N390 Urinary tract infection, site not specified: Secondary | ICD-10-CM | POA: Diagnosis not present

## 2021-08-27 DIAGNOSIS — E1129 Type 2 diabetes mellitus with other diabetic kidney complication: Secondary | ICD-10-CM | POA: Diagnosis not present

## 2021-08-27 DIAGNOSIS — I701 Atherosclerosis of renal artery: Secondary | ICD-10-CM | POA: Diagnosis not present

## 2021-08-27 DIAGNOSIS — Z7984 Long term (current) use of oral hypoglycemic drugs: Secondary | ICD-10-CM | POA: Diagnosis not present

## 2021-08-27 DIAGNOSIS — R42 Dizziness and giddiness: Secondary | ICD-10-CM | POA: Diagnosis not present

## 2021-08-27 DIAGNOSIS — E1140 Type 2 diabetes mellitus with diabetic neuropathy, unspecified: Secondary | ICD-10-CM | POA: Diagnosis not present

## 2021-08-27 DIAGNOSIS — J449 Chronic obstructive pulmonary disease, unspecified: Secondary | ICD-10-CM | POA: Diagnosis not present

## 2021-08-27 DIAGNOSIS — R809 Proteinuria, unspecified: Secondary | ICD-10-CM | POA: Diagnosis not present

## 2021-08-29 ENCOUNTER — Other Ambulatory Visit: Payer: Self-pay

## 2021-08-29 ENCOUNTER — Inpatient Hospital Stay (HOSPITAL_COMMUNITY)
Admission: EM | Admit: 2021-08-29 | Discharge: 2021-09-01 | DRG: 190 | Disposition: A | Discharge status: Home / Self Care | Payer: Medicare Other | Attending: Student | Admitting: Student

## 2021-08-29 ENCOUNTER — Encounter (HOSPITAL_COMMUNITY): Payer: Self-pay

## 2021-08-29 ENCOUNTER — Emergency Department (HOSPITAL_COMMUNITY): Payer: Medicare Other

## 2021-08-29 DIAGNOSIS — I10 Essential (primary) hypertension: Secondary | ICD-10-CM | POA: Diagnosis present

## 2021-08-29 DIAGNOSIS — T380X5A Adverse effect of glucocorticoids and synthetic analogues, initial encounter: Secondary | ICD-10-CM | POA: Diagnosis present

## 2021-08-29 DIAGNOSIS — Z66 Do not resuscitate: Secondary | ICD-10-CM | POA: Diagnosis present

## 2021-08-29 DIAGNOSIS — R0902 Hypoxemia: Secondary | ICD-10-CM | POA: Diagnosis not present

## 2021-08-29 DIAGNOSIS — J209 Acute bronchitis, unspecified: Secondary | ICD-10-CM | POA: Diagnosis not present

## 2021-08-29 DIAGNOSIS — J441 Chronic obstructive pulmonary disease with (acute) exacerbation: Secondary | ICD-10-CM | POA: Diagnosis present

## 2021-08-29 DIAGNOSIS — T502X5A Adverse effect of carbonic-anhydrase inhibitors, benzothiadiazides and other diuretics, initial encounter: Secondary | ICD-10-CM | POA: Diagnosis not present

## 2021-08-29 DIAGNOSIS — J440 Chronic obstructive pulmonary disease with acute lower respiratory infection: Secondary | ICD-10-CM | POA: Diagnosis present

## 2021-08-29 DIAGNOSIS — I110 Hypertensive heart disease with heart failure: Secondary | ICD-10-CM | POA: Diagnosis present

## 2021-08-29 DIAGNOSIS — Z888 Allergy status to other drugs, medicaments and biological substances status: Secondary | ICD-10-CM | POA: Diagnosis not present

## 2021-08-29 DIAGNOSIS — D649 Anemia, unspecified: Secondary | ICD-10-CM | POA: Diagnosis not present

## 2021-08-29 DIAGNOSIS — E1165 Type 2 diabetes mellitus with hyperglycemia: Secondary | ICD-10-CM | POA: Diagnosis present

## 2021-08-29 DIAGNOSIS — M1990 Unspecified osteoarthritis, unspecified site: Secondary | ICD-10-CM | POA: Diagnosis present

## 2021-08-29 DIAGNOSIS — I5032 Chronic diastolic (congestive) heart failure: Secondary | ICD-10-CM | POA: Diagnosis present

## 2021-08-29 DIAGNOSIS — Z20822 Contact with and (suspected) exposure to covid-19: Secondary | ICD-10-CM | POA: Diagnosis present

## 2021-08-29 DIAGNOSIS — Z885 Allergy status to narcotic agent status: Secondary | ICD-10-CM

## 2021-08-29 DIAGNOSIS — Z886 Allergy status to analgesic agent status: Secondary | ICD-10-CM | POA: Diagnosis not present

## 2021-08-29 DIAGNOSIS — E871 Hypo-osmolality and hyponatremia: Secondary | ICD-10-CM | POA: Diagnosis present

## 2021-08-29 DIAGNOSIS — T465X5A Adverse effect of other antihypertensive drugs, initial encounter: Secondary | ICD-10-CM | POA: Diagnosis not present

## 2021-08-29 DIAGNOSIS — K8689 Other specified diseases of pancreas: Secondary | ICD-10-CM | POA: Diagnosis not present

## 2021-08-29 DIAGNOSIS — T17990A Other foreign object in respiratory tract, part unspecified in causing asphyxiation, initial encounter: Secondary | ICD-10-CM | POA: Diagnosis present

## 2021-08-29 DIAGNOSIS — G8929 Other chronic pain: Secondary | ICD-10-CM

## 2021-08-29 DIAGNOSIS — Z7951 Long term (current) use of inhaled steroids: Secondary | ICD-10-CM

## 2021-08-29 DIAGNOSIS — Z882 Allergy status to sulfonamides status: Secondary | ICD-10-CM

## 2021-08-29 DIAGNOSIS — K5730 Diverticulosis of large intestine without perforation or abscess without bleeding: Secondary | ICD-10-CM | POA: Diagnosis present

## 2021-08-29 DIAGNOSIS — M545 Low back pain, unspecified: Secondary | ICD-10-CM | POA: Diagnosis not present

## 2021-08-29 DIAGNOSIS — C257 Malignant neoplasm of other parts of pancreas: Secondary | ICD-10-CM | POA: Diagnosis present

## 2021-08-29 DIAGNOSIS — Z7982 Long term (current) use of aspirin: Secondary | ICD-10-CM

## 2021-08-29 DIAGNOSIS — I700 Atherosclerosis of aorta: Secondary | ICD-10-CM | POA: Diagnosis not present

## 2021-08-29 DIAGNOSIS — K869 Disease of pancreas, unspecified: Secondary | ICD-10-CM | POA: Diagnosis not present

## 2021-08-29 DIAGNOSIS — K7689 Other specified diseases of liver: Secondary | ICD-10-CM | POA: Diagnosis not present

## 2021-08-29 DIAGNOSIS — Z881 Allergy status to other antibiotic agents status: Secondary | ICD-10-CM | POA: Diagnosis not present

## 2021-08-29 DIAGNOSIS — M25519 Pain in unspecified shoulder: Secondary | ICD-10-CM | POA: Diagnosis not present

## 2021-08-29 DIAGNOSIS — Z85828 Personal history of other malignant neoplasm of skin: Secondary | ICD-10-CM

## 2021-08-29 DIAGNOSIS — Z79899 Other long term (current) drug therapy: Secondary | ICD-10-CM

## 2021-08-29 DIAGNOSIS — R1032 Left lower quadrant pain: Secondary | ICD-10-CM

## 2021-08-29 DIAGNOSIS — R531 Weakness: Secondary | ICD-10-CM

## 2021-08-29 DIAGNOSIS — J9601 Acute respiratory failure with hypoxia: Secondary | ICD-10-CM | POA: Diagnosis not present

## 2021-08-29 DIAGNOSIS — R109 Unspecified abdominal pain: Secondary | ICD-10-CM

## 2021-08-29 DIAGNOSIS — Z923 Personal history of irradiation: Secondary | ICD-10-CM | POA: Diagnosis not present

## 2021-08-29 DIAGNOSIS — R059 Cough, unspecified: Secondary | ICD-10-CM | POA: Diagnosis not present

## 2021-08-29 DIAGNOSIS — M549 Dorsalgia, unspecified: Secondary | ICD-10-CM | POA: Diagnosis present

## 2021-08-29 DIAGNOSIS — Z825 Family history of asthma and other chronic lower respiratory diseases: Secondary | ICD-10-CM

## 2021-08-29 DIAGNOSIS — N179 Acute kidney failure, unspecified: Secondary | ICD-10-CM | POA: Diagnosis not present

## 2021-08-29 DIAGNOSIS — R0781 Pleurodynia: Secondary | ICD-10-CM | POA: Diagnosis not present

## 2021-08-29 DIAGNOSIS — Z7984 Long term (current) use of oral hypoglycemic drugs: Secondary | ICD-10-CM

## 2021-08-29 DIAGNOSIS — R935 Abnormal findings on diagnostic imaging of other abdominal regions, including retroperitoneum: Secondary | ICD-10-CM | POA: Diagnosis not present

## 2021-08-29 LAB — CBC WITH DIFFERENTIAL/PLATELET
Abs Immature Granulocytes: 0.04 10*3/uL (ref 0.00–0.07)
Basophils Absolute: 0.1 10*3/uL (ref 0.0–0.1)
Basophils Relative: 1 %
Eosinophils Absolute: 1 10*3/uL — ABNORMAL HIGH (ref 0.0–0.5)
Eosinophils Relative: 11 %
HCT: 32.3 % — ABNORMAL LOW (ref 36.0–46.0)
Hemoglobin: 10.7 g/dL — ABNORMAL LOW (ref 12.0–15.0)
Immature Granulocytes: 0 %
Lymphocytes Relative: 13 %
Lymphs Abs: 1.2 10*3/uL (ref 0.7–4.0)
MCH: 28.8 pg (ref 26.0–34.0)
MCHC: 33.1 g/dL (ref 30.0–36.0)
MCV: 86.8 fL (ref 80.0–100.0)
Monocytes Absolute: 1.3 10*3/uL — ABNORMAL HIGH (ref 0.1–1.0)
Monocytes Relative: 14 %
Neutro Abs: 5.7 10*3/uL (ref 1.7–7.7)
Neutrophils Relative %: 61 %
Platelets: 250 10*3/uL (ref 150–400)
RBC: 3.72 MIL/uL — ABNORMAL LOW (ref 3.87–5.11)
RDW: 13.2 % (ref 11.5–15.5)
WBC: 9.3 10*3/uL (ref 4.0–10.5)
nRBC: 0 % (ref 0.0–0.2)

## 2021-08-29 MED ORDER — HYDRALAZINE HCL 25 MG PO TABS
50.0000 mg | ORAL_TABLET | Freq: Once | ORAL | Status: AC
  Administered 2021-08-30: 50 mg via ORAL
  Filled 2021-08-29: qty 2

## 2021-08-29 MED ORDER — IPRATROPIUM-ALBUTEROL 0.5-2.5 (3) MG/3ML IN SOLN
3.0000 mL | Freq: Once | RESPIRATORY_TRACT | Status: AC
  Administered 2021-08-30: 3 mL via RESPIRATORY_TRACT
  Filled 2021-08-29: qty 3

## 2021-08-29 MED ORDER — SODIUM CHLORIDE 0.9 % IV BOLUS
500.0000 mL | Freq: Once | INTRAVENOUS | Status: AC
  Administered 2021-08-30: 500 mL via INTRAVENOUS

## 2021-08-29 MED ORDER — ACETAMINOPHEN 500 MG PO TABS
1000.0000 mg | ORAL_TABLET | Freq: Four times a day (QID) | ORAL | Status: DC | PRN
  Administered 2021-08-30: 1000 mg via ORAL
  Filled 2021-08-29: qty 2

## 2021-08-29 NOTE — ED Triage Notes (Addendum)
Pt BIB EMS from home. Pt c/o weakness x3 days. Pt states she was just seen 5/3 and was diagnosed with a  bladder infection. Pt A&Ox4. Pt normally walks at baseline.  ? ?CBG 169 ?20 G R FA ?168/84 ?80 HR ?89% RA pt has COPD ?93% on 2L [place] O2 with EMS ?

## 2021-08-29 NOTE — ED Provider Notes (Signed)
? ?Emergency Department Provider Note ? ? ?I have reviewed the triage vital signs and the nursing notes. ? ? ?HISTORY ? ?Chief Complaint ?Weakness ? ? ?HPI ?Katie Alvarado is a 88 y.o. female with PMH of COPD, CHF, DM, HTN returns to the emergency department with generalized weakness, shortness of breath, left lower quadrant abdominal pain.  Patient was seen for weakness 7 days ago and diagnosed with potential developing urinary tract infection.  She was discharged home with a prescription for Keflex which she has been taking with no improvement in weakness.  She is felt some increased shortness of breath is been using her albuterol nebulizer and inhaler.  She typically is up and ambulatory but over the past week has not been set.  She is not having vomiting or diarrhea.  She is having intermittent sharp pain in the left lower abdomen.  No clear modifying factors.  She is not feeling chest pain/pressure.  No other new medications.  ? ?Past Medical History:  ?Diagnosis Date  ? Acute nasopharyngitis (common cold)   ? Arthritis   ? Bronchiectasis with acute exacerbation ([place])   ? Cancer [facility])   ? skin cancer  ? COPD (chronic obstructive pulmonary disease) ([place])   ? sees dr. Annamaria Boots   ? Cough   ? Diabetes mellitus   ? fasting 130-150  ? Dizziness   ? Esophageal reflux   ? History of radiation therapy 10/24/17- 12/05/17  ? Lower lip, 4.4 Gy X 10 fractions given twice weekly for a total dose of 44 Gy.  ? Hypertension   ? Insomnia, unspecified   ? Other symptoms involving nervous and musculoskeletal systems(781.99)   ? Pneumonia   ? hx of  ? Shortness of breath   ? exertion  ? ? ?Review of Systems ? ?Constitutional: No fever/chills. Positive weakness.  ?Eyes: No visual changes. ?ENT: No sore throat. ?Cardiovascular: Denies chest pain. ?Respiratory: Positive shortness of breath. ?Gastrointestinal: Positive LLQ abdominal pain.  No nausea, no vomiting.  No diarrhea.  No constipation. ?Genitourinary: Negative for  dysuria. ?Musculoskeletal: Negative for back pain. ?Skin: Negative for rash. ?Neurological: Negative for headaches, focal weakness or numbness. ? ? ?____________________________________________ ? ? ?PHYSICAL EXAM: ? ?VITAL SIGNS: ?ED Triage Vitals [08/29/21 2143]  ?Enc Vitals Group  ?   BP (!) 184/114  ?   Pulse Rate 87  ?   Resp 19  ?   Temp 98 ?F (36.7 ?C)  ?   Temp Source Oral  ?   SpO2 94 %  ? ?Constitutional: Alert and oriented. Able to provide history but does appear SOB.  ?Eyes: Conjunctivae are normal.  ?Head: Atraumatic. ?Nose: No congestion/rhinnorhea. ?Mouth/Throat: Mucous membranes are slightly dry.   ?Neck: No stridor.   ?Cardiovascular: Normal rate, regular rhythm. Good peripheral circulation. Grossly normal heart sounds.   ?Respiratory: Positive respiratory effort.  No retractions. Lungs with mild end-expiratory wheezing.  ?Gastrointestinal: Soft with focal LLQ tenderness w/o peritonitis. No distention.  ?Musculoskeletal: No lower extremity tenderness nor edema. No gross deformities of extremities. ?Neurologic:  Normal speech and language. No gross focal neurologic deficits are appreciated.  ?Skin:  Skin is warm, dry and intact. No rash noted. ? ? ?____________________________________________ ?  ?LABS ?(all labs ordered are listed, but only abnormal results are displayed) ? ?Labs Reviewed  ?COMPREHENSIVE METABOLIC PANEL - Abnormal; Notable for the following components:  ?    Result Value  ? Sodium 134 (*)   ? Glucose, Bld 121 (*)   ? Total Protein  6.3 (*)   ? Albumin 3.3 (*)   ? AST 14 (*)   ? All other components within normal limits  ?CBC WITH DIFFERENTIAL/PLATELET - Abnormal; Notable for the following components:  ? RBC 3.72 (*)   ? Hemoglobin 10.7 (*)   ? HCT 32.3 (*)   ? Monocytes Absolute 1.3 (*)   ? Eosinophils Absolute 1.0 (*)   ? All other components within normal limits  ?BLOOD GAS, VENOUS - Abnormal; Notable for the following components:  ? pCO2, Ven 40 (*)   ? pO2, Ven 56 (*)   ? All other  components within normal limits  ?URINALYSIS, ROUTINE W REFLEX MICROSCOPIC - Abnormal; Notable for the following components:  ? Color, Urine STRAW (*)   ? All other components within normal limits  ?BRAIN NATRIURETIC PEPTIDE - Abnormal; Notable for the following components:  ? B Natriuretic Peptide 126.0 (*)   ? All other components within normal limits  ?BASIC METABOLIC PANEL - Abnormal; Notable for the following components:  ? Sodium 134 (*)   ? Glucose, Bld 119 (*)   ? Calcium 8.8 (*)   ? All other components within normal limits  ?CBC WITH DIFFERENTIAL/PLATELET - Abnormal; Notable for the following components:  ? RBC 3.54 (*)   ? Hemoglobin 10.0 (*)   ? HCT 30.8 (*)   ? Monocytes Absolute 1.1 (*)   ? Eosinophils Absolute 0.8 (*)   ? All other components within normal limits  ?D-DIMER, QUANTITATIVE - Abnormal; Notable for the following components:  ? D-Dimer, Quant 0.60 (*)   ? All other components within normal limits  ?CANCER ANTIGEN 19-9 - Abnormal; Notable for the following components:  ? CA 19-9 57 (*)   ? All other components within normal limits  ?GLUCOSE, CAPILLARY - Abnormal; Notable for the following components:  ? Glucose-Capillary 314 (*)   ? All other components within normal limits  ?RENAL FUNCTION PANEL - Abnormal; Notable for the following components:  ? Sodium 131 (*)   ? Chloride 95 (*)   ? Glucose, Bld 179 (*)   ? BUN 24 (*)   ? Creatinine, Ser 1.13 (*)   ? Phosphorus 4.8 (*)   ? Albumin 3.2 (*)   ? GFR, Estimated 45 (*)   ? All other components within normal limits  ?CBC - Abnormal; Notable for the following components:  ? RBC 3.84 (*)   ? Hemoglobin 10.6 (*)   ? HCT 33.7 (*)   ? All other components within normal limits  ?RETICULOCYTES - Abnormal; Notable for the following components:  ? RBC. 3.71 (*)   ? All other components within normal limits  ?HEMOGLOBIN A1C - Abnormal; Notable for the following components:  ? Hgb A1c MFr Bld 7.2 (*)   ? All other components within normal limits   ?GLUCOSE, CAPILLARY - Abnormal; Notable for the following components:  ? Glucose-Capillary 268 (*)   ? All other components within normal limits  ?GLUCOSE, CAPILLARY - Abnormal; Notable for the following components:  ? Glucose-Capillary 181 (*)   ? All other components within normal limits  ?GLUCOSE, CAPILLARY - Abnormal; Notable for the following components:  ? Glucose-Capillary 186 (*)   ? All other components within normal limits  ?GLUCOSE, CAPILLARY - Abnormal; Notable for the following components:  ? Glucose-Capillary 264 (*)   ? All other components within normal limits  ?CBG MONITORING, ED - Abnormal; Notable for the following components:  ? Glucose-Capillary 119 (*)   ? All other  components within normal limits  ?CBG MONITORING, ED - Abnormal; Notable for the following components:  ? Glucose-Capillary 123 (*)   ? All other components within normal limits  ?URINE CULTURE  ?RESP PANEL BY RT-PCR (FLU A&B, COVID) ARPGX2  ?EXPECTORATED SPUTUM ASSESSMENT W GRAM STAIN, RFLX TO RESP C  ?LIPASE, BLOOD  ?MAGNESIUM  ?VITAMIN B12  ?FOLATE  ?IRON AND TIBC  ?FERRITIN  ?CK  ?CBC  ?TROPONIN I (HIGH SENSITIVITY)  ?TROPONIN I (HIGH SENSITIVITY)  ? ?____________________________________________ ? ?EKG ? ? EKG Interpretation ? ?Date/Time:  Wednesday Aug 29 2021 21:41:44 EDT ?Ventricular Rate:  89 ?PR Interval:  179 ?QRS Duration: 86 ?QT Interval:  339 ?QTC Calculation: 413 ?R Axis:   6 ?Text Interpretation: Sinus rhythm Probable left atrial enlargement Anteroseptal infarct, age indeterminate No significant change since last tracing Confirmed by Wandra Arthurs [phone]) on 08/30/2021 11:15:44 PM ?  ? ?  ? ?____________________________________________ ? ? ?PROCEDURES ? ?Procedure(s) performed:  ? ?Procedures ? ?None  ?____________________________________________ ? ? ?INITIAL IMPRESSION / ASSESSMENT AND PLAN / ED COURSE ? ?Pertinent labs & imaging results that were available during my care of the patient were reviewed by me and  considered in my medical decision making (see chart for details). ?  ?This patient is Presenting for Evaluation of weakness, which does require a range of treatment options, and is a complaint that involves a high risk of morbidity

## 2021-08-30 ENCOUNTER — Inpatient Hospital Stay (HOSPITAL_COMMUNITY): Payer: Medicare Other

## 2021-08-30 ENCOUNTER — Encounter (HOSPITAL_COMMUNITY): Payer: Self-pay | Admitting: Internal Medicine

## 2021-08-30 ENCOUNTER — Observation Stay (HOSPITAL_COMMUNITY): Payer: Medicare Other

## 2021-08-30 ENCOUNTER — Emergency Department (HOSPITAL_COMMUNITY): Payer: Medicare Other

## 2021-08-30 DIAGNOSIS — J209 Acute bronchitis, unspecified: Secondary | ICD-10-CM | POA: Diagnosis present

## 2021-08-30 DIAGNOSIS — N179 Acute kidney failure, unspecified: Secondary | ICD-10-CM | POA: Diagnosis not present

## 2021-08-30 DIAGNOSIS — I5032 Chronic diastolic (congestive) heart failure: Secondary | ICD-10-CM | POA: Diagnosis present

## 2021-08-30 DIAGNOSIS — K869 Disease of pancreas, unspecified: Secondary | ICD-10-CM

## 2021-08-30 DIAGNOSIS — R531 Weakness: Secondary | ICD-10-CM

## 2021-08-30 DIAGNOSIS — K7689 Other specified diseases of liver: Secondary | ICD-10-CM | POA: Diagnosis not present

## 2021-08-30 DIAGNOSIS — C257 Malignant neoplasm of other parts of pancreas: Secondary | ICD-10-CM | POA: Diagnosis present

## 2021-08-30 DIAGNOSIS — J9601 Acute respiratory failure with hypoxia: Secondary | ICD-10-CM | POA: Diagnosis not present

## 2021-08-30 DIAGNOSIS — M545 Low back pain, unspecified: Secondary | ICD-10-CM | POA: Diagnosis not present

## 2021-08-30 DIAGNOSIS — J441 Chronic obstructive pulmonary disease with (acute) exacerbation: Secondary | ICD-10-CM

## 2021-08-30 DIAGNOSIS — R1032 Left lower quadrant pain: Secondary | ICD-10-CM

## 2021-08-30 DIAGNOSIS — E1165 Type 2 diabetes mellitus with hyperglycemia: Secondary | ICD-10-CM | POA: Diagnosis present

## 2021-08-30 DIAGNOSIS — Z886 Allergy status to analgesic agent status: Secondary | ICD-10-CM | POA: Diagnosis not present

## 2021-08-30 DIAGNOSIS — Z882 Allergy status to sulfonamides status: Secondary | ICD-10-CM | POA: Diagnosis not present

## 2021-08-30 DIAGNOSIS — D649 Anemia, unspecified: Secondary | ICD-10-CM

## 2021-08-30 DIAGNOSIS — Z885 Allergy status to narcotic agent status: Secondary | ICD-10-CM | POA: Diagnosis not present

## 2021-08-30 DIAGNOSIS — R059 Cough, unspecified: Secondary | ICD-10-CM | POA: Diagnosis not present

## 2021-08-30 DIAGNOSIS — Z881 Allergy status to other antibiotic agents status: Secondary | ICD-10-CM | POA: Diagnosis not present

## 2021-08-30 DIAGNOSIS — I700 Atherosclerosis of aorta: Secondary | ICD-10-CM | POA: Diagnosis not present

## 2021-08-30 DIAGNOSIS — Z7982 Long term (current) use of aspirin: Secondary | ICD-10-CM | POA: Diagnosis not present

## 2021-08-30 DIAGNOSIS — K5730 Diverticulosis of large intestine without perforation or abscess without bleeding: Secondary | ICD-10-CM | POA: Diagnosis present

## 2021-08-30 DIAGNOSIS — Z85828 Personal history of other malignant neoplasm of skin: Secondary | ICD-10-CM | POA: Diagnosis not present

## 2021-08-30 DIAGNOSIS — K8689 Other specified diseases of pancreas: Secondary | ICD-10-CM | POA: Diagnosis not present

## 2021-08-30 DIAGNOSIS — Z66 Do not resuscitate: Secondary | ICD-10-CM | POA: Diagnosis present

## 2021-08-30 DIAGNOSIS — Z20822 Contact with and (suspected) exposure to covid-19: Secondary | ICD-10-CM | POA: Diagnosis present

## 2021-08-30 DIAGNOSIS — Z825 Family history of asthma and other chronic lower respiratory diseases: Secondary | ICD-10-CM | POA: Diagnosis not present

## 2021-08-30 DIAGNOSIS — M1990 Unspecified osteoarthritis, unspecified site: Secondary | ICD-10-CM | POA: Diagnosis present

## 2021-08-30 DIAGNOSIS — R935 Abnormal findings on diagnostic imaging of other abdominal regions, including retroperitoneum: Secondary | ICD-10-CM | POA: Diagnosis not present

## 2021-08-30 DIAGNOSIS — T17990A Other foreign object in respiratory tract, part unspecified in causing asphyxiation, initial encounter: Secondary | ICD-10-CM | POA: Diagnosis present

## 2021-08-30 DIAGNOSIS — J440 Chronic obstructive pulmonary disease with acute lower respiratory infection: Secondary | ICD-10-CM | POA: Diagnosis present

## 2021-08-30 DIAGNOSIS — E871 Hypo-osmolality and hyponatremia: Secondary | ICD-10-CM

## 2021-08-30 DIAGNOSIS — Z888 Allergy status to other drugs, medicaments and biological substances status: Secondary | ICD-10-CM | POA: Diagnosis not present

## 2021-08-30 DIAGNOSIS — Z923 Personal history of irradiation: Secondary | ICD-10-CM | POA: Diagnosis not present

## 2021-08-30 DIAGNOSIS — I110 Hypertensive heart disease with heart failure: Secondary | ICD-10-CM | POA: Diagnosis present

## 2021-08-30 DIAGNOSIS — I10 Essential (primary) hypertension: Secondary | ICD-10-CM | POA: Diagnosis not present

## 2021-08-30 DIAGNOSIS — G8929 Other chronic pain: Secondary | ICD-10-CM

## 2021-08-30 LAB — CBC WITH DIFFERENTIAL/PLATELET
Abs Immature Granulocytes: 0.02 10*3/uL (ref 0.00–0.07)
Basophils Absolute: 0.1 10*3/uL (ref 0.0–0.1)
Basophils Relative: 2 %
Eosinophils Absolute: 0.8 10*3/uL — ABNORMAL HIGH (ref 0.0–0.5)
Eosinophils Relative: 12 %
HCT: 30.8 % — ABNORMAL LOW (ref 36.0–46.0)
Hemoglobin: 10 g/dL — ABNORMAL LOW (ref 12.0–15.0)
Immature Granulocytes: 0 %
Lymphocytes Relative: 18 %
Lymphs Abs: 1.2 10*3/uL (ref 0.7–4.0)
MCH: 28.2 pg (ref 26.0–34.0)
MCHC: 32.5 g/dL (ref 30.0–36.0)
MCV: 87 fL (ref 80.0–100.0)
Monocytes Absolute: 1.1 10*3/uL — ABNORMAL HIGH (ref 0.1–1.0)
Monocytes Relative: 16 %
Neutro Abs: 3.5 10*3/uL (ref 1.7–7.7)
Neutrophils Relative %: 52 %
Platelets: 228 10*3/uL (ref 150–400)
RBC: 3.54 MIL/uL — ABNORMAL LOW (ref 3.87–5.11)
RDW: 13.2 % (ref 11.5–15.5)
WBC: 6.7 10*3/uL (ref 4.0–10.5)
nRBC: 0 % (ref 0.0–0.2)

## 2021-08-30 LAB — COMPREHENSIVE METABOLIC PANEL
ALT: 11 U/L (ref 0–44)
AST: 14 U/L — ABNORMAL LOW (ref 15–41)
Albumin: 3.3 g/dL — ABNORMAL LOW (ref 3.5–5.0)
Alkaline Phosphatase: 56 U/L (ref 38–126)
Anion gap: 8 (ref 5–15)
BUN: 16 mg/dL (ref 8–23)
CO2: 27 mmol/L (ref 22–32)
Calcium: 8.9 mg/dL (ref 8.9–10.3)
Chloride: 99 mmol/L (ref 98–111)
Creatinine, Ser: 0.73 mg/dL (ref 0.44–1.00)
GFR, Estimated: >60 mL/min (ref >60)
Glucose, Bld: 121 mg/dL — ABNORMAL HIGH (ref 70–99)
Potassium: 3.7 mmol/L (ref 3.5–5.1)
Sodium: 134 mmol/L — ABNORMAL LOW (ref 135–145)
Total Bilirubin: 0.8 mg/dL (ref 0.3–1.2)
Total Protein: 6.3 g/dL — ABNORMAL LOW (ref 6.5–8.1)

## 2021-08-30 LAB — BASIC METABOLIC PANEL
Anion gap: 7 (ref 5–15)
BUN: 14 mg/dL (ref 8–23)
CO2: 27 mmol/L (ref 22–32)
Calcium: 8.8 mg/dL — ABNORMAL LOW (ref 8.9–10.3)
Chloride: 100 mmol/L (ref 98–111)
Creatinine, Ser: 0.68 mg/dL (ref 0.44–1.00)
GFR, Estimated: >60 mL/min (ref >60)
Glucose, Bld: 119 mg/dL — ABNORMAL HIGH (ref 70–99)
Potassium: 3.7 mmol/L (ref 3.5–5.1)
Sodium: 134 mmol/L — ABNORMAL LOW (ref 135–145)

## 2021-08-30 LAB — TROPONIN I (HIGH SENSITIVITY)
Troponin I (High Sensitivity): 4 ng/L (ref <18)
Troponin I (High Sensitivity): 4 ng/L (ref <18)

## 2021-08-30 LAB — BLOOD GAS, VENOUS
Acid-Base Excess: 1.3 mmol/L (ref 0.0–2.0)
Bicarbonate: 25.9 mmol/L (ref 20.0–28.0)
O2 Saturation: 91.3 %
Patient temperature: 37
pCO2, Ven: 40 mmHg — ABNORMAL LOW (ref 44–60)
pH, Ven: 7.42 (ref 7.25–7.43)
pO2, Ven: 56 mmHg — ABNORMAL HIGH (ref 32–45)

## 2021-08-30 LAB — CBG MONITORING, ED
Glucose-Capillary: 119 mg/dL — ABNORMAL HIGH (ref 70–99)
Glucose-Capillary: 123 mg/dL — ABNORMAL HIGH (ref 70–99)

## 2021-08-30 LAB — URINALYSIS, ROUTINE W REFLEX MICROSCOPIC
Bilirubin Urine: NEGATIVE
Glucose, UA: NEGATIVE mg/dL
Hgb urine dipstick: NEGATIVE
Ketones, ur: NEGATIVE mg/dL
Leukocytes,Ua: NEGATIVE
Nitrite: NEGATIVE
Protein, ur: NEGATIVE mg/dL
Specific Gravity, Urine: 1.008 (ref 1.005–1.030)
pH: 7 (ref 5.0–8.0)

## 2021-08-30 LAB — GLUCOSE, CAPILLARY
Glucose-Capillary: 314 mg/dL — ABNORMAL HIGH (ref 70–99)
Glucose-Capillary: 268 mg/dL — ABNORMAL HIGH (ref 70–99)

## 2021-08-30 LAB — D-DIMER, QUANTITATIVE: D-Dimer, Quant: 0.6 ug/mL-FEU — ABNORMAL HIGH (ref 0.00–0.50)

## 2021-08-30 LAB — RESP PANEL BY RT-PCR (FLU A&B, COVID) ARPGX2
Influenza A by PCR: NEGATIVE
Influenza B by PCR: NEGATIVE
SARS Coronavirus 2 by RT PCR: NEGATIVE

## 2021-08-30 LAB — BRAIN NATRIURETIC PEPTIDE: B Natriuretic Peptide: 126 pg/mL — ABNORMAL HIGH (ref 0.0–100.0)

## 2021-08-30 LAB — LIPASE, BLOOD: Lipase: 49 U/L (ref 11–51)

## 2021-08-30 MED ORDER — GUAIFENESIN-DM 100-10 MG/5ML PO SYRP
5.0000 mL | ORAL_SOLUTION | ORAL | Status: DC | PRN
  Administered 2021-08-30 – 2021-09-01 (×6): 5 mL via ORAL
  Filled 2021-08-30 (×6): qty 5

## 2021-08-30 MED ORDER — ALBUTEROL SULFATE (2.5 MG/3ML) 0.083% IN NEBU: 2.5000 mg | INHALATION_SOLUTION | RESPIRATORY_TRACT | Status: DC | PRN

## 2021-08-30 MED ORDER — IRBESARTAN 150 MG PO TABS
300.0000 mg | ORAL_TABLET | Freq: Every day | ORAL | Status: DC
  Administered 2021-08-30: 300 mg via ORAL
  Filled 2021-08-30: qty 1

## 2021-08-30 MED ORDER — HYDRALAZINE HCL 50 MG PO TABS
50.0000 mg | ORAL_TABLET | Freq: Three times a day (TID) | ORAL | Status: DC
  Administered 2021-08-30 (×3): 50 mg via ORAL
  Filled 2021-08-30: qty 2
  Filled 2021-08-30 (×2): qty 1

## 2021-08-30 MED ORDER — ACETAMINOPHEN 650 MG RE SUPP: 650.0000 mg | Freq: Four times a day (QID) | RECTAL | Status: DC | PRN

## 2021-08-30 MED ORDER — ENOXAPARIN SODIUM 40 MG/0.4ML IJ SOSY
40.0000 mg | PREFILLED_SYRINGE | INTRAMUSCULAR | Status: DC
  Filled 2021-08-30 (×2): qty 0.4

## 2021-08-30 MED ORDER — BUDESONIDE 0.25 MG/2ML IN SUSP
0.2500 mg | Freq: Two times a day (BID) | RESPIRATORY_TRACT | Status: DC
  Administered 2021-08-30 – 2021-09-01 (×5): 0.25 mg via RESPIRATORY_TRACT
  Filled 2021-08-30 (×5): qty 2

## 2021-08-30 MED ORDER — IPRATROPIUM BROMIDE 0.02 % IN SOLN: 0.5000 mg | RESPIRATORY_TRACT | Status: DC

## 2021-08-30 MED ORDER — AZITHROMYCIN 500 MG IV SOLR
500.0000 mg | INTRAVENOUS | Status: DC
  Administered 2021-08-30 – 2021-09-01 (×3): 500 mg via INTRAVENOUS
  Filled 2021-08-30 (×3): qty 5

## 2021-08-30 MED ORDER — IOHEXOL 300 MG/ML  SOLN
80.0000 mL | Freq: Once | INTRAMUSCULAR | Status: AC | PRN
  Administered 2021-08-30: 80 mL via INTRAVENOUS

## 2021-08-30 MED ORDER — INSULIN ASPART 100 UNIT/ML IJ SOLN
0.0000 [IU] | Freq: Three times a day (TID) | INTRAMUSCULAR | Status: DC
  Administered 2021-08-30: 1 [IU] via SUBCUTANEOUS
  Administered 2021-08-30: 5 [IU] via SUBCUTANEOUS
  Administered 2021-08-31 (×2): 2 [IU] via SUBCUTANEOUS
  Filled 2021-08-30: qty 0.09

## 2021-08-30 MED ORDER — FUROSEMIDE 20 MG PO TABS
20.0000 mg | ORAL_TABLET | Freq: Every day | ORAL | Status: DC
  Administered 2021-08-30: 20 mg via ORAL
  Filled 2021-08-30: qty 1

## 2021-08-30 MED ORDER — CARVEDILOL 12.5 MG PO TABS
12.5000 mg | ORAL_TABLET | Freq: Two times a day (BID) | ORAL | Status: AC
  Administered 2021-08-30: 12.5 mg via ORAL
  Filled 2021-08-30: qty 1

## 2021-08-30 MED ORDER — PANTOPRAZOLE SODIUM 40 MG PO TBEC
40.0000 mg | DELAYED_RELEASE_TABLET | Freq: Every day | ORAL | Status: DC
  Administered 2021-08-30 – 2021-09-01 (×3): 40 mg via ORAL
  Filled 2021-08-30 (×3): qty 1

## 2021-08-30 MED ORDER — IPRATROPIUM-ALBUTEROL 0.5-2.5 (3) MG/3ML IN SOLN
3.0000 mL | Freq: Three times a day (TID) | RESPIRATORY_TRACT | Status: DC
  Administered 2021-08-31: 3 mL via RESPIRATORY_TRACT
  Filled 2021-08-30: qty 3

## 2021-08-30 MED ORDER — ALPRAZOLAM 0.25 MG PO TABS
0.2500 mg | ORAL_TABLET | Freq: Three times a day (TID) | ORAL | Status: DC | PRN
  Administered 2021-08-30 – 2021-09-01 (×3): 0.25 mg via ORAL
  Filled 2021-08-30 (×3): qty 1

## 2021-08-30 MED ORDER — ACETAMINOPHEN 325 MG PO TABS
650.0000 mg | ORAL_TABLET | Freq: Four times a day (QID) | ORAL | Status: DC | PRN
  Administered 2021-08-30 – 2021-09-01 (×4): 650 mg via ORAL
  Filled 2021-08-30 (×4): qty 2

## 2021-08-30 MED ORDER — ASPIRIN EC 81 MG PO TBEC
81.0000 mg | DELAYED_RELEASE_TABLET | Freq: Every day | ORAL | Status: DC
  Administered 2021-08-30 – 2021-09-01 (×3): 81 mg via ORAL
  Filled 2021-08-30 (×3): qty 1

## 2021-08-30 MED ORDER — METHYLPREDNISOLONE SODIUM SUCC 40 MG IJ SOLR
40.0000 mg | Freq: Two times a day (BID) | INTRAMUSCULAR | Status: DC
  Administered 2021-08-30 – 2021-09-01 (×4): 40 mg via INTRAVENOUS
  Filled 2021-08-30 (×4): qty 1

## 2021-08-30 MED ORDER — IPRATROPIUM-ALBUTEROL 0.5-2.5 (3) MG/3ML IN SOLN
3.0000 mL | RESPIRATORY_TRACT | Status: DC
  Administered 2021-08-30 (×4): 3 mL via RESPIRATORY_TRACT
  Filled 2021-08-30 (×4): qty 3

## 2021-08-30 MED ORDER — METOPROLOL TARTRATE 25 MG PO TABS: 25.0000 mg | ORAL_TABLET | Freq: Two times a day (BID) | ORAL | Status: DC

## 2021-08-30 MED ORDER — CARVEDILOL 12.5 MG PO TABS
12.5000 mg | ORAL_TABLET | Freq: Two times a day (BID) | ORAL | Status: DC
  Administered 2021-08-30: 12.5 mg via ORAL
  Filled 2021-08-30: qty 1

## 2021-08-30 MED ORDER — IPRATROPIUM-ALBUTEROL 0.5-2.5 (3) MG/3ML IN SOLN
3.0000 mL | RESPIRATORY_TRACT | Status: DC
  Administered 2021-08-30: 3 mL via RESPIRATORY_TRACT
  Filled 2021-08-30: qty 3

## 2021-08-30 MED ORDER — INSULIN ASPART 100 UNIT/ML IJ SOLN
5.0000 [IU] | Freq: Once | INTRAMUSCULAR | Status: AC
  Administered 2021-08-30: 5 [IU] via SUBCUTANEOUS

## 2021-08-30 MED ORDER — LORAZEPAM 2 MG/ML IJ SOLN
0.5000 mg | Freq: Once | INTRAMUSCULAR | Status: AC | PRN
  Administered 2021-08-30: 0.5 mg via INTRAVENOUS
  Filled 2021-08-30: qty 1

## 2021-08-30 MED ORDER — ALBUTEROL SULFATE (2.5 MG/3ML) 0.083% IN NEBU: 2.5000 mg | INHALATION_SOLUTION | RESPIRATORY_TRACT | Status: DC

## 2021-08-30 NOTE — Assessment & Plan Note (Addendum)
Stable.  Recheck at follow-up. ?

## 2021-08-30 NOTE — Assessment & Plan Note (Signed)
See respiratory failure 

## 2021-08-30 NOTE — Plan of Care (Signed)

## 2021-08-30 NOTE — Assessment & Plan Note (Addendum)
Blood pressure seems to fluctuate but improved. ?-Changed Coreg to metoprolol for better beta-1 selectivity ?-Patient to continue home hydralazine ?-Hold Avapro and Lasix for 2 more days ?

## 2021-08-30 NOTE — Assessment & Plan Note (Addendum)
TTE 12/2019 with LVEF of 60 to 65%, G1 DD. I do not think this is contributing to her breathing.  BNP 126.  Appears euvolemic on exam.  On p.o. Lasix at home. ?-Advised to hold Lasix for 2 more days ?

## 2021-08-30 NOTE — Assessment & Plan Note (Addendum)
Resolved.  Exam reassuring. ?

## 2021-08-30 NOTE — Assessment & Plan Note (Signed)
Stable. ?-Tylenol as needed ?

## 2021-08-30 NOTE — Progress Notes (Signed)
?PROGRESS NOTE ? ?Katie Alvarado XHB:[id number] DOB: 10-04-1925  ? ?PCP: [name], MD ? ?Patient is from: Home.  Lives alone.  Uses walker at baseline. ? ?DOA: 08/29/2021 LOS: 0 ? ?Chief complaints ?Chief Complaint  ?Patient presents with  ? Weakness  ?  ? ?Brief Narrative / Interim history: ?88 year old F with PMH of COPD, bronchiectasis, diastolic CHF, DM-2 and chronic back pain presenting with progressive shortness of breath, generalized weakness, dyspnea on exertion and low oxygen, and admitted for "acute respiratory failure with hypoxia" due to COPD exacerbation and mucous plugging.  Reportedly saturating at 85 to 87% at home.   ? ?In ED, hypertensive to 184/114.  RR in upper 20s.  No documented desaturation but donned 2 L by [place]. Na 134.  Hgb 10.7.  BNP 126.  Troponin 4x2.  COVID-19 and influenza PCR nonreactive.  CXR without acute finding.  CT chest/abdomen/pelvis showed mucous plugging and nonspecific finding in the pancreatic tail.  MRCP and D-dimer ordered.  Patient was started on nebulizers and Zithromax, and admitted.  ? ?Subjective: ?Seen and examined earlier this morning.  No major events overnight of this morning.  She reports shortness of breath, DOE, productive cough with clear phlegm.  She denies chest pain, nausea, vomiting or abdominal pain.  She feels pain in her left groin that has started here.  She thinks she might have pulled a muscle during his CT scan.  She denies UTI symptoms.  She has chronic back pain unchanged from baseline.  She feels weak ? ?Objective: ?Vitals:  ? 08/30/21 0615 08/30/21 0630 08/30/21 0824 08/30/21 0830  ?BP:  127/64 (!) 155/71 (!) 182/84  ?Pulse: 70 66 80 88  ?Resp: 13 14  (!) 26  ?Temp:      ?TempSrc:      ?SpO2: 96% 96%  97%  ? ? ?Examination: ? ?GENERAL: No apparent distress.  Nontoxic. ?HEENT: MMM.  Vision and hearing grossly intact.  ?NECK: Supple.  No apparent JVD.  ?RESP:  No IWOB.  Fair aeration bilaterally. ?CVS:  RRR. Heart sounds normal.   ?ABD/GI/GU: BS+. Abd soft, NTND.  ?MSK/EXT:  Moves extremities. No apparent deformity. No edema.  Fair range of motion in both hips ?SKIN: no apparent skin lesion or wound ?NEURO: Awake, alert and oriented appropriately.  Subtle weakness in LLE with left hip flexion compared to right partly due to pain in the left groin.  Otherwise, no apparent focal neuro deficit. ?PSYCH: Calm. Normal affect.  ? ?Procedures:  ?None ? ?Microbiology summarized: ?COVID-19 and influenza PCR nonreactive. ?Respiratory culture pending ?Urine culture pending ? ?Assessment and Plan: ?* Acute respiratory failure with hypoxia ([facility]) ?Reportedly saturating at 85 to 87% prior to arrival.  CT chest concerning for mucous plugging.  Concern about exacerbation of his COPD and bronchiectasis.  Still with shortness of breath, DOE and productive cough with clear phlegm.  Some crackles in left lung on exam. ?-Continue azithromycin and nebulizers including Pulmicort and scheduled DuoNeb ?-Add low-dose steroid. ?-Respiratory culture ?-Consider changing Coreg beta-1 selective beta-blocker or other antihypertensive med ?-Wean off oxygen, incentive spirometry, flutter valve, OOB, ambulatory saturation, PT/OT ? ?COPD with acute exacerbation ([place]) ?See respiratory failure ? ?Generalized weakness ?Multifactorial including respiratory failure, COPD and bronchiectasis.  ?-PT/OT ? ?Left groin pain ?Unclear etiology of this.  She thinks she might have pulled a muscle during CT scan.  She has septal weakness with left hip flexion.  Seems to have fair range of motion without significant pain.  She has no  focal tenderness, swelling or erythema. ?-Supportive care with as needed Tylenol ?-Physical therapy ? ?Pancreatic lesion ?CT abdomen and pelvis showed hyperdense focus in the proximal pancreatic ?tail, possible calcification with dilatation of the pancreatic duct distal to this measuring up to 5 mm.  LFT and lipase within normal.  No GI symptoms.  Abdominal exam  benign. ?-Follow MRCP. ? ?Chronic diastolic CHF (congestive heart failure) ([place]) ?TTE 12/2019 with LVEF of 60 to 65%, G1 DD.  Appears euvolemic.  I do not think this is contributing to her breathing.  BNP 126. ?-Continue home p.o. Lasix 20 mg daily ? ?Uncontrolled hypertension ?Blood pressure seems to fluctuate a lot.  Unsure if we are getting accurate measurements.  Overall improved. ?-Continue home Avapro and hydralazine. ?-May consider changing Coreg to something else given underlying COPD ? ?Controlled NIDDM-2 ?A1c 6.6% about 8 months ago.  On low-dose glipizide at home. ?Recent Labs  ?Lab 08/30/21 ?0809  ?GLUCAP 119*  ?-Recheck hemoglobin A1c ?-Continue SSI-sensitive. ? ?Chronic back pain ?Stable. ?-Tylenol as needed ? ?Normocytic anemia ?Recent Labs  ?  01/01/21 ?1538 01/02/21 ?4128 01/03/21 ?0453 01/04/21 ?7867 04/23/21 ?1122 04/25/21 ?0355 08/22/21 ?1100 08/29/21 ?2148 08/30/21 ?0458  ?HGB 10.1* 10.0* 10.4* 10.1* 10.3* 11.1* 11.1* 10.7* 10.0*  ?H&H stable. ?Check anemia panel in the morning ? ? ?Hyponatremia ?Stable.  Continue monitoring ? ? ? ?DVT prophylaxis:  ?enoxaparin (LOVENOX) injection 40 mg Start: 08/30/21 1000 ? ?Code Status: DNR ?Family Communication: Updated patient's friend at bedside ?Level of care: Telemetry ?Status is: Observation ?The patient will require care spanning > 2 midnights and should be moved to inpatient because: Respiratory distress in the setting of mucous plugging, COPD exacerbation and bronchitis and further evaluation of pancreatic lesion with MRCP and therapy evaluation. ? ? ?Final disposition: TBD ?Consultants:  ?None ? ?Sch Meds:  ?Scheduled Meds: ? aspirin EC  81 mg Oral Daily  ? budesonide (PULMICORT) nebulizer solution  0.25 mg Nebulization BID  ? carvedilol  12.5 mg Oral BID WC  ? enoxaparin (LOVENOX) injection  40 mg Subcutaneous Q24H  ? furosemide  20 mg Oral Daily  ? hydrALAZINE  50 mg Oral TID  ? insulin aspart  0-9 Units Subcutaneous TID WC  ? ipratropium-albuterol   3 mL Nebulization Q4H  ? irbesartan  300 mg Oral Daily  ? pantoprazole  40 mg Oral Daily  ? ?Continuous Infusions: ? azithromycin Stopped (08/30/21 0803)  ? ?PRN Meds:.acetaminophen **OR** acetaminophen, albuterol, ALPRAZolam, LORazepam ? ?Antimicrobials: ?Anti-infectives (From admission, onward)  ? ? Start     Dose/Rate Route Frequency Ordered Stop  ? 08/30/21 0630  azithromycin (ZITHROMAX) 500 mg in sodium chloride 0.9 % 250 mL IVPB       ? 500 mg ?250 mL/hr over 60 Minutes Intravenous Every 24 hours 08/30/21 0618    ? ?  ? ? ? ?I have personally reviewed the following labs and images: ?CBC: ?Recent Labs  ?Lab 08/29/21 ?2148 08/30/21 ?0458  ?WBC 9.3 6.7  ?NEUTROABS 5.7 3.5  ?HGB 10.7* 10.0*  ?HCT 32.3* 30.8*  ?MCV 86.8 87.0  ?PLT 250 228  ? ?BMP &GFR ?Recent Labs  ?Lab 08/29/21 ?2148 08/30/21 ?0458  ?NA 134* 134*  ?K 3.7 3.7  ?CL 99 100  ?CO2 27 27  ?GLUCOSE 121* 119*  ?BUN 16 14  ?CREATININE 0.73 0.68  ?CALCIUM 8.9 8.8*  ? ?Estimated Creatinine Clearance: 39.5 mL/min (by C-G formula based on SCr of 0.68 mg/dL). ?Liver & Pancreas: ?Recent Labs  ?Lab 08/29/21 ?2148  ?AST  14*  ?ALT 11  ?ALKPHOS 56  ?BILITOT 0.8  ?PROT 6.3*  ?ALBUMIN 3.3*  ? ?Recent Labs  ?Lab 08/29/21 ?2148  ?LIPASE 49  ? ?No results for input(s): AMMONIA in the last 168 hours. ?Diabetic: ?No results for input(s): HGBA1C in the last 72 hours. ?Recent Labs  ?Lab 08/30/21 ?0809  ?GLUCAP 119*  ? ?Cardiac Enzymes: ?No results for input(s): CKTOTAL, CKMB, CKMBINDEX, TROPONINI in the last 168 hours. ?No results for input(s): PROBNP in the last 8760 hours. ?Coagulation Profile: ?No results for input(s): INR, PROTIME in the last 168 hours. ?Thyroid Function Tests: ?No results for input(s): TSH, T4TOTAL, FREET4, T3FREE, THYROIDAB in the last 72 hours. ?Lipid Profile: ?No results for input(s): CHOL, HDL, LDLCALC, TRIG, CHOLHDL, LDLDIRECT in the last 72 hours. ?Anemia Panel: ?No results for input(s): VITAMINB12, FOLATE, FERRITIN, TIBC, IRON, RETICCTPCT in the  last 72 hours. ?Urine analysis: ?   ?Component Value Date/Time  ? COLORURINE STRAW (A) 08/29/2021 0002  ? APPEARANCEUR CLEAR 08/29/2021 0002  ? LABSPEC 1.008 08/29/2021 0002  ? PHURINE 7.0 08/29/2021 0002  ? GLUC

## 2021-08-30 NOTE — Assessment & Plan Note (Addendum)
Concern for pancreatic lesion ?-[facility] consulted and recommended outpatient follow-up for EUS ?

## 2021-08-30 NOTE — Assessment & Plan Note (Addendum)
Likely due to COPD exacerbation.  Resolved.   ?

## 2021-08-30 NOTE — H&P (Addendum)
History and Physical    Katie Alvarado OZD:[id number] DOB: May 15, 1925 DOA: 08/29/2021  PCP: Daisy Floro, MD  Patient coming from: Home.  Chief Complaint: Shortness of breath.  HPI: Katie Alvarado is a 88 y.o. female with history of COPD, bronchiectasis, diastolic CHF, diabetes mellitus presents to the ER after patient was found to be increasingly short of breath over the last 24 hours by patient's son.  Patient also was getting increasingly weak complaining of back pain.  No nausea vomiting or diarrhea.  Did not complain of any chest pain.  Did not have any fever chills.  ED Course: In the ER patient was found to be hypoxic and mildly wheezing requiring 2 L oxygen.  Chest x-ray did not show anything acute.  Since patient was having back pain a CT abdomen pelvis was done which shows mucous plugging in the lungs and also nonspecific finding in the pancreas.  For which MRCP has been ordered.  Patient's BNP was around 126 EKG was nonspecific.  High sensitive troponins were negative.  Patient admitted for acute hypoxic respiratory failure likely from a combination of bronchitis and bronchiectasis.  Review of Systems: As per HPI, rest all negative.   Past Medical History:  Diagnosis Date   Acute nasopharyngitis (common cold)    Arthritis    Bronchiectasis with acute exacerbation (HCC)    Cancer (HCC)    skin cancer   COPD (chronic obstructive pulmonary disease) (HCC)    sees dr. Maple Hudson    Cough    Diabetes mellitus    fasting 130-150   Dizziness    Esophageal reflux    History of radiation therapy 10/24/17- 12/05/17   Lower lip, 4.4 Gy X 10 fractions given twice weekly for a total dose of 44 Gy.   Hypertension    Insomnia, unspecified    Other symptoms involving nervous and musculoskeletal systems(781.99)    Pneumonia    hx of   Shortness of breath    exertion    Past Surgical History:  Procedure Laterality Date   ABDOMINAL HYSTERECTOMY     ANKLE FRACTURE SURGERY Left     APPENDECTOMY     BASAL CELL CARCINOMA EXCISION     NOSE   CATARACT EXTRACTION, BILATERAL     OUT PATIENT   COLON SURGERY     colon resection for diverticulitis   IR EPIDUROGRAPHY  05/07/2018   LUMBAR LAMINECTOMY/DECOMPRESSION MICRODISCECTOMY N/A 02/15/2013   Procedure: LUMBAR LAMINECTOMY/DECOMPRESSION MICRODISCECTOMY LUMBAR FOUR-FIVE;  Surgeon: Clydene Fake, MD;  Location: [facility];  Service: Neurosurgery;  Laterality: N/A;   VARICOSE VEIN SURGERY       reports that she has never smoked. She has never used smokeless tobacco. She reports current alcohol use. She reports that she does not use drugs.  Allergies  Allergen Reactions   Welchol [Colesevelam Hcl] Hives   Amlodipine     Ankle swelling   Atorvastatin     Other reaction(s): foot pain   Coreg [Carvedilol]     dizzy   Doxycycline     rash   Glucosamine Itching and Other (See Comments)    "Breakouts"   Hydrocodone-Acetaminophen     Other reaction(s): did not feel right   Mobic [Meloxicam] Other (See Comments)    Unknown reaction   Sulfa Antibiotics     itching   Telmisartan     itching   Tramadol Nausea Only    *can tolerate 1/2 dose" per husband at bedside   Celecoxib  Hives, Itching and Rash   Statins Rash    Family History  Problem Relation Age of Onset   Chronic bronchitis Father    Other Sister        heart trouble   Other Brother        heart trouble    Prior to Admission medications   Medication Sig Start Date End Date Taking? Authorizing Provider  acetaminophen (TYLENOL) 325 MG tablet Take 325 mg by mouth every 6 (six) hours as needed for mild pain.   Yes [provider]  albuterol (VENTOLIN HFA) 108 (90 Base) MCG/ACT inhaler INHALE 2 PUFFS INTO THE LUNGS EVERY 6 HOURS AS NEEDED FOR WHEEZING OR SHORTNESS OF BREATH Patient taking differently: Inhale 2 puffs into the lungs every 6 (six) hours as needed for wheezing or shortness of breath. 06/18/21  Yes Young, Joni Fears D, MD  ALPRAZolam  Prudy Feeler) 0.25 MG tablet Take 0.25 mg by mouth 3 (three) times daily as needed for anxiety or sleep.   Yes [provider]  aspirin EC 81 MG tablet Take 81 mg by mouth daily. Swallow whole.   Yes [provider]  benzonatate (TESSALON) 200 MG capsule Take 1 capsule (200 mg total) by mouth 3 (three) times daily as needed for cough. 02/21/21  Yes Young, Clinton D, MD  budesonide (PULMICORT) 0.25 MG/2ML nebulizer solution USE 1 VIAL  IN  NEBULIZER TWICE  DAILY - rinse mouth after treatment Patient taking differently: Take 0.25 mg by nebulization daily. 06/30/20  Yes Young, Joni Fears D, MD  carboxymethylcellulose (REFRESH PLUS) 0.5 % SOLN Place 1 drop into both eyes as needed (dry eyes).   Yes [provider]  carvedilol (COREG) 12.5 MG tablet Take 12.5 mg by mouth 2 (two) times daily with a meal.   Yes [provider]  cholecalciferol (VITAMIN D) 1000 UNITS tablet Take 1,000 Units by mouth daily.   Yes [provider]  Cinnamon 500 MG capsule Take 1,000 mg by mouth 2 (two) times daily as needed (for sugar levels).   Yes [provider]  fluocinolone (SYNALAR) 0.01 % external solution Apply 1 application. topically 2 (two) times daily as needed for irritation. 04/13/21  Yes [provider]  furosemide (LASIX) 20 MG tablet Take 20 mg by mouth daily.   Yes [provider]  glipiZIDE (GLUCOTROL) 5 MG tablet Take 5 mg by mouth every morning. 02/14/21  Yes [provider]  hydrALAZINE (APRESOLINE) 50 MG tablet Take 50 mg by mouth 3 (three) times daily. 03/12/21  Yes [provider]  irbesartan (AVAPRO) 300 MG tablet TAKE 1 TABLET BY MOUTH EVERY DAY Patient taking differently: Take 300 mg by mouth daily. 03/29/21  Yes Jake Bathe, MD  ketoconazole (NIZORAL) 2 % shampoo Apply 1 application topically See admin instructions. Apply to scalp 2 to 3 times per week. 09/25/20  Yes [provider]  meclizine (ANTIVERT) 12.5 MG  tablet Take 12.5 mg by mouth 3 (three) times daily as needed for dizziness.   Yes [provider]  Multiple Vitamins-Minerals (PRESERVISION AREDS 2 PO) Take 1 tablet by mouth in the morning and at bedtime.   Yes [provider]  ondansetron (ZOFRAN) 4 MG tablet Take 4 mg by mouth every 6 (six) hours as needed for nausea or vomiting. 10/11/20  Yes [provider]  vitamin E 400 UNIT capsule Take 400 Units by mouth 2 (two) times a week.   Yes [provider]  acetaminophen-codeine (TYLENOL #2) 300-15 MG  tablet 1 every 6 hours if needed for pain or cough Patient not taking: Reported on 06/25/2021 02/21/21   Waymon Budge, MD  cephALEXin (KEFLEX) 500 MG capsule Take 1 capsule (500 mg total) by mouth 4 (four) times daily. Patient not taking: Reported on 08/30/2021 08/22/21   Wynetta Fines, MD  esomeprazole (NEXIUM) 40 MG capsule Take 40 mg by mouth daily.    [provider]  ipratropium (ATROVENT) 0.02 % nebulizer solution USE 1 VIAL IN NEBULIZER 4 TIMES DAILY 07/05/21   Waymon Budge, MD  Respiratory Therapy Supplies (FLUTTER) DEVI Use as directed 11/03/15   Waymon Budge, MD    Physical Exam: Constitutional: Moderately built and nourished. Vitals:   08/30/21 0200 08/30/21 0215 08/30/21 0230 08/30/21 0300  BP: 140/60  120/62 117/68  Pulse: 79 78 80 79  Resp: 18 16 14 14   Temp:      TempSrc:      SpO2: 95% 96% 95% 96%   Eyes: Anicteric no pallor.  ENMT: No discharge from the ears eyes nose and mouth. Neck: No mass felt.  No neck rigidity. Respiratory: Mild expiratory wheeze and no crepitations. Cardiovascular: S1-S2 heard. Abdomen: Soft nontender bowel sound present. Musculoskeletal: No edema. Skin: No rash. Neurologic: Alert awake oriented time place and person.  Moves all extremities. Psychiatric: Appears normal.  Normal affect.   Labs on Admission: I have personally reviewed following labs and imaging studies  CBC: Recent Labs  Lab  08/29/21 2148  WBC 9.3  NEUTROABS 5.7  HGB 10.7*  HCT 32.3*  MCV 86.8  PLT 250   Basic Metabolic Panel: Recent Labs  Lab 08/29/21 2148  NA 134*  K 3.7  CL 99  CO2 27  GLUCOSE 121*  BUN 16  CREATININE 0.73  CALCIUM 8.9   GFR: Estimated Creatinine Clearance: 39.5 mL/min (by C-G formula based on SCr of 0.73 mg/dL). Liver Function Tests: Recent Labs  Lab 08/29/21 2148  AST 14*  ALT 11  ALKPHOS 56  BILITOT 0.8  PROT 6.3*  ALBUMIN 3.3*   Recent Labs  Lab 08/29/21 2148  LIPASE 49   No results for input(s): AMMONIA in the last 168 hours. Coagulation Profile: No results for input(s): INR, PROTIME in the last 168 hours. Cardiac Enzymes: No results for input(s): CKTOTAL, CKMB, CKMBINDEX, TROPONINI in the last 168 hours. BNP (last 3 results) No results for input(s): PROBNP in the last 8760 hours. HbA1C: No results for input(s): HGBA1C in the last 72 hours. CBG: No results for input(s): GLUCAP in the last 168 hours. Lipid Profile: No results for input(s): CHOL, HDL, LDLCALC, TRIG, CHOLHDL, LDLDIRECT in the last 72 hours. Thyroid Function Tests: No results for input(s): TSH, T4TOTAL, FREET4, T3FREE, THYROIDAB in the last 72 hours. Anemia Panel: No results for input(s): VITAMINB12, FOLATE, FERRITIN, TIBC, IRON, RETICCTPCT in the last 72 hours. Urine analysis:    Component Value Date/Time   COLORURINE STRAW (A) 08/29/2021 0002   APPEARANCEUR CLEAR 08/29/2021 0002   LABSPEC 1.008 08/29/2021 0002   PHURINE 7.0 08/29/2021 0002   GLUCOSEU NEGATIVE 08/29/2021 0002   HGBUR NEGATIVE 08/29/2021 0002   BILIRUBINUR NEGATIVE 08/29/2021 0002   KETONESUR NEGATIVE 08/29/2021 0002   PROTEINUR NEGATIVE 08/29/2021 0002   UROBILINOGEN 0.2 12/19/2018 1155   NITRITE NEGATIVE 08/29/2021 0002   LEUKOCYTESUR NEGATIVE 08/29/2021 0002   Sepsis Labs: @LABRCNTIP (procalcitonin:4,lacticidven:4) ) Recent Results (from the past 240 hour(s))  Resp Panel by RT-PCR (Flu A&B, Covid)  Nasopharyngeal Swab  Status: None   Collection Time: 08/22/21 11:00 AM   Specimen: Nasopharyngeal Swab; Nasopharyngeal(NP) swabs in vial transport medium  Result Value Ref Range Status   SARS Coronavirus 2 by RT PCR NEGATIVE NEGATIVE Final    Comment: (NOTE) SARS-CoV-2 target nucleic acids are NOT DETECTED.  The SARS-CoV-2 RNA is generally detectable in upper respiratory specimens during the acute phase of infection. The lowest concentration of SARS-CoV-2 viral copies this assay can detect is 138 copies/mL. A negative result does not preclude SARS-Cov-2 infection and should not be used as the sole basis for treatment or other patient management decisions. A negative result may occur with  improper specimen collection/handling, submission of specimen other than nasopharyngeal swab, presence of viral mutation(s) within the areas targeted by this assay, and inadequate number of viral copies(<138 copies/mL). A negative result must be combined with clinical observations, patient history, and epidemiological information. The expected result is Negative.  Fact Sheet for Patients:  BloggerCourse.com  Fact Sheet for Healthcare Providers:  SeriousBroker.it  This test is no t yet approved or cleared by the [place] FDA and  has been authorized for detection and/or diagnosis of SARS-CoV-2 by FDA under an Emergency Use Authorization (EUA). This EUA will remain  in effect (meaning this test can be used) for the duration of the COVID-19 declaration under Section 564(b)(1) of the Act, 21 U.S.C.section 360bbb-3(b)(1), unless the authorization is terminated  or revoked sooner.       Influenza A by PCR NEGATIVE NEGATIVE Final   Influenza B by PCR NEGATIVE NEGATIVE Final    Comment: (NOTE) The Xpert Xpress SARS-CoV-2/FLU/RSV plus assay is intended as an aid in the diagnosis of influenza from Nasopharyngeal swab specimens and should not be  used as a sole basis for treatment. Nasal washings and aspirates are unacceptable for Xpert Xpress SARS-CoV-2/FLU/RSV testing.  Fact Sheet for Patients: BloggerCourse.com  Fact Sheet for Healthcare Providers: SeriousBroker.it  This test is not yet approved or cleared by the [place] FDA and has been authorized for detection and/or diagnosis of SARS-CoV-2 by FDA under an Emergency Use Authorization (EUA). This EUA will remain in effect (meaning this test can be used) for the duration of the COVID-19 declaration under Section 564(b)(1) of the Act, 21 U.S.C. section 360bbb-3(b)(1), unless the authorization is terminated or revoked.  Performed at [facility], [address], [place], [place] [address]   Urine Culture     Status: Abnormal   Collection Time: 08/22/21 11:30 AM   Specimen: Urine, Clean Catch  Result Value Ref Range Status   Specimen Description   Final    URINE, CLEAN CATCH Performed at [facility], [address], [place], [place] [address]    Special Requests   Final    NONE Performed at [facility], [address], [place], [place] [address]    Culture MULTIPLE SPECIES PRESENT, SUGGEST RECOLLECTION (A)  Final   Report Status 08/23/2021 FINAL  Final  Resp Panel by RT-PCR (Flu A&B, Covid) Nasopharyngeal Swab     Status: None   Collection Time: 08/29/21  9:48 PM   Specimen: Nasopharyngeal Swab; Nasopharyngeal(NP) swabs in vial transport medium  Result Value Ref Range Status   SARS Coronavirus 2 by RT PCR NEGATIVE NEGATIVE Final    Comment: (NOTE) SARS-CoV-2 target nucleic acids are NOT DETECTED.  The SARS-CoV-2 RNA is generally detectable in upper respiratory specimens during the acute phase of infection. The lowest concentration of SARS-CoV-2 viral copies this assay  can detect is 138 copies/mL. A negative result does not preclude  SARS-Cov-2 infection and should not be used as the sole basis for treatment or other patient management decisions. A negative result may occur with  improper specimen collection/handling, submission of specimen other than nasopharyngeal swab, presence of viral mutation(s) within the areas targeted by this assay, and inadequate number of viral copies(<138 copies/mL). A negative result must be combined with clinical observations, patient history, and epidemiological information. The expected result is Negative.  Fact Sheet for Patients:  BloggerCourse.com  Fact Sheet for Healthcare Providers:  SeriousBroker.it  This test is no t yet approved or cleared by the [place] FDA and  has been authorized for detection and/or diagnosis of SARS-CoV-2 by FDA under an Emergency Use Authorization (EUA). This EUA will remain  in effect (meaning this test can be used) for the duration of the COVID-19 declaration under Section 564(b)(1) of the Act, 21 U.S.C.section 360bbb-3(b)(1), unless the authorization is terminated  or revoked sooner.       Influenza A by PCR NEGATIVE NEGATIVE Final   Influenza B by PCR NEGATIVE NEGATIVE Final    Comment: (NOTE) The Xpert Xpress SARS-CoV-2/FLU/RSV plus assay is intended as an aid in the diagnosis of influenza from Nasopharyngeal swab specimens and should not be used as a sole basis for treatment. Nasal washings and aspirates are unacceptable for Xpert Xpress SARS-CoV-2/FLU/RSV testing.  Fact Sheet for Patients: BloggerCourse.com  Fact Sheet for Healthcare Providers: SeriousBroker.it  This test is not yet approved or cleared by the [place] FDA and has been authorized for detection and/or diagnosis of SARS-CoV-2 by FDA under an Emergency Use Authorization (EUA). This EUA will remain in effect (meaning this test can be used) for the duration of  the COVID-19 declaration under Section 564(b)(1) of the Act, 21 U.S.C. section 360bbb-3(b)(1), unless the authorization is terminated or revoked.  Performed at [facility], [address] [place], [place] [address]      Radiological Exams on Admission: CT ABDOMEN PELVIS W CONTRAST  Result Date: 08/30/2021 CLINICAL DATA:  Left lower quadrant abdominal pain. EXAM: CT ABDOMEN AND PELVIS WITH CONTRAST TECHNIQUE: Multidetector CT imaging of the abdomen and pelvis was performed using the standard protocol following bolus administration of intravenous contrast. RADIATION DOSE REDUCTION: This exam was performed according to the departmental dose-optimization program which includes automated exposure control, adjustment of the mA and/or kV according to patient size and/or use of iterative reconstruction technique. CONTRAST:  80 mL Omnipaque 300 COMPARISON:  06/01/2007. FINDINGS: Lower chest: The heart is normal in size and coronary artery calcifications are noted. There is multifocal bronchial wall thickening with patchy mucous plugging at the lung bases, greater on the left than on the right. Hepatobiliary: A subcentimeter hypodensity is noted in the left lobe of the liver which is too small to further characterize. The gallbladder is without stones. No biliary ductal dilatation. Pancreas: There is a hyperdense focus in the proximal pancreatic tail, possible calcification. There is dilatation of the pancreatic duct distal to this measuring up to 5 mm. Spleen: Normal in size without focal abnormality. Adrenals/Urinary Tract: The adrenal glands are within normal limits. No renal calculus or hydronephrosis. Subcentimeter hypodensities are noted in the kidneys bilaterally which are too small to further characterize. The bladder is unremarkable. Stomach/Bowel: The stomach is nondistended and otherwise within normal limits. No bowel obstruction, free air, or pneumatosis. Multiple scattered  diverticula are present along the sigmoid colon without evidence of diverticulitis. Right hemicolectomy  changes are noted. Vascular/Lymphatic: Aortic atherosclerosis. No enlarged abdominal or pelvic lymph nodes. Reproductive: Status post hysterectomy. No adnexal masses. Other: No abdominopelvic ascites. Musculoskeletal: Degenerative changes in the thoracolumbar spine. No acute osseous abnormality. IMPRESSION: 1. Sigmoid diverticulosis without diverticulitis. 2. Multifocal mucous plugging at the lung bases. 3. Hyperdense structure in the pancreatic tail, possible calcification, with distal ductal dilatation. MRI is recommended for further characterization on follow-up. 4. Aortic atherosclerosis and coronary artery calcifications. Electronically Signed   By: Thornell Sartorius M.D.   On: 08/30/2021 02:39   DG Chest Portable 1 View  Result Date: 08/30/2021 CLINICAL DATA:  Cough, weakness EXAM: PORTABLE CHEST 1 VIEW COMPARISON:  08/22/2021 FINDINGS: Increased markings in the mid and lower lung zones, left greater than right compatible with bronchiectasis and scarring as seen on prior chest CT. No acute confluent airspace opacity. Heart is normal size. No effusions. Aortic atherosclerosis. IMPRESSION: Stable chronic changes.  No active disease. Electronically Signed   By: Charlett Nose M.D.   On: 08/30/2021 00:13    EKG: Independently reviewed.  Normal sinus rhythm.  Assessment/Plan Principal Problem:   Acute respiratory failure with hypoxia (HCC) Active Problems:   COPD with acute exacerbation (HCC)   Type 2 diabetes mellitus with hyperglycemia, without long-term current use of insulin (HCC)   Generalized weakness   Essential hypertension   Acute bronchitis   Acute respiratory failure with hypoxemia (HCC)    Acute respiratory failure with hypoxia presently on 2 L oxygen which is new for the patient.  Likely combination of COPD and bronchiectasis.  For which patient has been placed on nebulizer and  Pulmicort and also will add Zithromax.  We will check D-dimer. Chronic diastolic CHF appears compensated we will continue Lasix.  Last EF measured in 2021 was 60 to 65% with grade 1 diastolic dysfunction. Hypertension continue ARB hydralazine and Coreg. Diabetes mellitus type 2 Place patient on sliding scale coverage. Chronic anemia follow CBC.   DVT prophylaxis: Lovenox. Code Status: DNR. Family Communication: Patient's son at the bedside. Disposition Plan: Home. Consults called: None. Admission status: Observation.   Eduard Clos MD [facility] Pager [phone].  If 7PM-7AM, please contact night-coverage [email or web address] Password [facility]  08/30/2021, 3:42 AM

## 2021-08-30 NOTE — Assessment & Plan Note (Addendum)
Recent Labs  ?  01/02/21 ?3716 01/03/21 ?0453 01/04/21 ?9678 04/23/21 ?1122 04/25/21 ?0355 08/22/21 ?1100 08/29/21 ?2148 08/30/21 ?0458 08/31/21 ?9381 09/01/21 ?0601  ?HGB 10.0* 10.4* 10.1* 10.3* 11.1* 11.1* 10.7* 10.0* 10.6* 10.0*  ?H&H relatively stable. ?Recheck CBC at follow-up ? ?

## 2021-08-30 NOTE — Assessment & Plan Note (Addendum)
Multifactorial including respiratory failure, COPD and bronchiectasis.  Improved.  No need identified by therapy. ?

## 2021-08-30 NOTE — Plan of Care (Signed)
  Problem: Pain Managment: Goal: General experience of comfort will improve Outcome: Progressing   Problem: Safety: Goal: Ability to remain free from injury will improve Outcome: Progressing   Problem: Skin Integrity: Goal: Risk for impaired skin integrity will decrease Outcome: Progressing   

## 2021-08-30 NOTE — Hospital Course (Addendum)
88 year old F with PMH of COPD, bronchiectasis, diastolic CHF, DM-2 and chronic back pain presenting with progressive shortness of breath, generalized weakness, dyspnea on exertion and low oxygen, and admitted for "acute respiratory failure with hypoxia" due to COPD exacerbation and mucous plugging.  Reportedly saturating at 85 to 87% at home.   ? ?In ED, hypertensive to 184/114.  RR in upper 20s.  No documented desaturation but donned 2 L by [place]. Na 134.  Hgb 10.7.  BNP 126.  Troponin 4x2.  COVID-19 and influenza PCR nonreactive.  CXR without acute finding.  D-dimer 0.6 (normal for age). CT chest/abdomen/pelvis showed mucous plugging and nonspecific finding in the pancreatic tail for which MRCP was recommended.  Patient was started on Solu-Medrol, Zithromax and nebulizers for COPD exacerbation/possible bronchiectasis.  ? 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 [facility] consulted and recommended outpatient follow-up for EUS.  Patient had no GI symptoms. ? ?In regards to respiratory failure/COPD exacerbation, continued on systemic steroid, Zithromax and nebulizers with improvement in her breathing.  Eventually, she was liberated off oxygen and maintaining good saturation with ambulation on room air.  She is discharged on p.o. prednisone and Zithromax for 2 more days.  She is already on scheduled Atrovent and as needed albuterol at home.  We changed Coreg to metoprolol for better beta-1 selectivity.  ? ?Patient had mild AKI likely from p.o. diuretics and ARB.  AKI resolved after holding diuretics and ARB.  Patient to hold diuretics and ARB for 2 more days.  ? ?Patient was evaluated by therapy but no need was identified. ?

## 2021-08-30 NOTE — ED Provider Notes (Signed)
?  Physical Exam  ?BP 117/68   Pulse 79   Temp 98.7 ?F (37.1 ?C) (Oral)   Resp 14   SpO2 96%  ? ?Physical Exam ?Vitals and nursing note reviewed.  ?Constitutional:   ?   General: She is not in acute distress. ?   Appearance: She is well-developed. She is not diaphoretic.  ?HENT:  ?   Head: Normocephalic and atraumatic.  ?Cardiovascular:  ?   Rate and Rhythm: Normal rate and regular rhythm.  ?   Heart sounds: No murmur heard. ?  No friction rub. No gallop.  ?Pulmonary:  ?   Effort: Pulmonary effort is normal. No respiratory distress.  ?   Breath sounds: Rhonchi present. No wheezing.  ?Abdominal:  ?   General: Bowel sounds are normal. There is no distension.  ?   Palpations: Abdomen is soft.  ?   Tenderness: There is no abdominal tenderness.  ?Musculoskeletal:     ?   General: Normal range of motion.  ?   Cervical back: Normal range of motion and neck supple.  ?Skin: ?   General: Skin is warm and dry.  ?Neurological:  ?   General: No focal deficit present.  ?   Mental Status: She is alert and oriented to person, place, and time.  ? ? ?Procedures  ?Procedures ? ?ED Course / MDM  ?Care assumed from Dr. Laverta Baltimore at shift change.  Patient presenting here with complaints of shortness of breath and abdominal pain.  Care signed out to me awaiting results of CT of the abdomen and pelvis, results of laboratory studies, and response to medications. ? ?According to family at bedside, patient has been increasingly weak and short of breath with oxygen saturations in the mid 80s at home. ? ?Her laboratory studies have shown no significant abnormality, CT of the abdomen showed no acute process.  Symptoms seem to be related to an exacerbation of COPD for which she will be admitted to the hospitalist service for further care. ? ? ? ? ?  ?Veryl Speak, MD ?08/30/21 [phone] ? ?

## 2021-08-30 NOTE — Progress Notes (Signed)
Sent RN Joy secure message to attempt to get patient at 6:22am   RN didn't respond. Sent 2nd message stating we could go ahead and pick up patient if she was npo. RN responded after 7a that she believed she was. We asked RN to ask patient if she was claustrophobic and RN stated she was . I asked RN if she can get her some Anti anxiety meds and Rn stated she was giving report and would pass along to day shift which has delayed the patients exam.  We are moving forward with scanning the next patient as we have held our scanner for her for almost an hour. ?

## 2021-08-30 NOTE — Assessment & Plan Note (Addendum)
A1c 7.2%.  On low-dose glipizide at home.  Hyperglycemia likely due to steroid. ?Discharged on home medications. ?Advised to hold Lasix and Avapro for 2 more days ?

## 2021-08-31 DIAGNOSIS — R531 Weakness: Secondary | ICD-10-CM | POA: Diagnosis not present

## 2021-08-31 DIAGNOSIS — J441 Chronic obstructive pulmonary disease with (acute) exacerbation: Secondary | ICD-10-CM | POA: Diagnosis not present

## 2021-08-31 DIAGNOSIS — N179 Acute kidney failure, unspecified: Secondary | ICD-10-CM

## 2021-08-31 DIAGNOSIS — J9601 Acute respiratory failure with hypoxia: Secondary | ICD-10-CM | POA: Diagnosis not present

## 2021-08-31 DIAGNOSIS — J209 Acute bronchitis, unspecified: Secondary | ICD-10-CM | POA: Diagnosis not present

## 2021-08-31 LAB — RENAL FUNCTION PANEL
Albumin: 3.2 g/dL — ABNORMAL LOW (ref 3.5–5.0)
Anion gap: 12 (ref 5–15)
BUN: 24 mg/dL — ABNORMAL HIGH (ref 8–23)
CO2: 24 mmol/L (ref 22–32)
Calcium: 9.2 mg/dL (ref 8.9–10.3)
Chloride: 95 mmol/L — ABNORMAL LOW (ref 98–111)
Creatinine, Ser: 1.13 mg/dL — ABNORMAL HIGH (ref 0.44–1.00)
GFR, Estimated: 45 mL/min — ABNORMAL LOW (ref >60)
Glucose, Bld: 179 mg/dL — ABNORMAL HIGH (ref 70–99)
Phosphorus: 4.8 mg/dL — ABNORMAL HIGH (ref 2.5–4.6)
Potassium: 4.4 mmol/L (ref 3.5–5.1)
Sodium: 131 mmol/L — ABNORMAL LOW (ref 135–145)

## 2021-08-31 LAB — CBC
HCT: 33.7 % — ABNORMAL LOW (ref 36.0–46.0)
Hemoglobin: 10.6 g/dL — ABNORMAL LOW (ref 12.0–15.0)
MCH: 27.6 pg (ref 26.0–34.0)
MCHC: 31.5 g/dL (ref 30.0–36.0)
MCV: 87.8 fL (ref 80.0–100.0)
Platelets: 269 10*3/uL (ref 150–400)
RBC: 3.84 MIL/uL — ABNORMAL LOW (ref 3.87–5.11)
RDW: 13.2 % (ref 11.5–15.5)
WBC: 6.9 10*3/uL (ref 4.0–10.5)
nRBC: 0 % (ref 0.0–0.2)

## 2021-08-31 LAB — RETICULOCYTES
Immature Retic Fract: 14.8 % (ref 2.3–15.9)
RBC.: 3.71 MIL/uL — ABNORMAL LOW (ref 3.87–5.11)
Retic Count, Absolute: 48.2 10*3/uL (ref 19.0–186.0)
Retic Ct Pct: 1.3 % (ref 0.4–3.1)

## 2021-08-31 LAB — GLUCOSE, CAPILLARY
Glucose-Capillary: 181 mg/dL — ABNORMAL HIGH (ref 70–99)
Glucose-Capillary: 186 mg/dL — ABNORMAL HIGH (ref 70–99)
Glucose-Capillary: 264 mg/dL — ABNORMAL HIGH (ref 70–99)
Glucose-Capillary: 266 mg/dL — ABNORMAL HIGH (ref 70–99)
Glucose-Capillary: 334 mg/dL — ABNORMAL HIGH (ref 70–99)
Glucose-Capillary: 161 mg/dL — ABNORMAL HIGH (ref 70–99)

## 2021-08-31 LAB — HEMOGLOBIN A1C
Hgb A1c MFr Bld: 7.2 % — ABNORMAL HIGH (ref 4.8–5.6)
Mean Plasma Glucose: 159.94 mg/dL

## 2021-08-31 LAB — FOLATE: Folate: 15.3 ng/mL (ref >5.9)

## 2021-08-31 LAB — IRON AND TIBC
Iron: 46 ug/dL (ref 28–170)
Saturation Ratios: 14 % (ref 10.4–31.8)
TIBC: 331 ug/dL (ref 250–450)
UIBC: 285 ug/dL

## 2021-08-31 LAB — CANCER ANTIGEN 19-9: CA 19-9: 57 U/mL — ABNORMAL HIGH (ref 0–35)

## 2021-08-31 LAB — MAGNESIUM: Magnesium: 1.9 mg/dL (ref 1.7–2.4)

## 2021-08-31 LAB — VITAMIN B12: Vitamin B-12: 234 pg/mL (ref 180–914)

## 2021-08-31 LAB — URINE CULTURE: Culture: NO GROWTH

## 2021-08-31 LAB — CK: Total CK: 44 U/L (ref 38–234)

## 2021-08-31 LAB — FERRITIN: Ferritin: 28 ng/mL (ref 11–307)

## 2021-08-31 MED ORDER — METOPROLOL TARTRATE 50 MG PO TABS
50.0000 mg | ORAL_TABLET | Freq: Two times a day (BID) | ORAL | Status: DC
  Administered 2021-08-31 – 2021-09-01 (×3): 50 mg via ORAL
  Filled 2021-08-31 (×3): qty 1

## 2021-08-31 MED ORDER — IPRATROPIUM-ALBUTEROL 0.5-2.5 (3) MG/3ML IN SOLN: 3.0000 mL | Freq: Four times a day (QID) | RESPIRATORY_TRACT | Status: DC | PRN

## 2021-08-31 MED ORDER — ENOXAPARIN SODIUM 30 MG/0.3ML IJ SOSY
30.0000 mg | PREFILLED_SYRINGE | INTRAMUSCULAR | Status: DC
  Administered 2021-09-01: 30 mg via SUBCUTANEOUS
  Filled 2021-08-31: qty 0.3

## 2021-08-31 MED ORDER — INSULIN ASPART 100 UNIT/ML IJ SOLN
2.0000 [IU] | Freq: Three times a day (TID) | INTRAMUSCULAR | Status: DC
  Administered 2021-08-31 – 2021-09-01 (×2): 2 [IU] via SUBCUTANEOUS

## 2021-08-31 MED ORDER — HYDRALAZINE HCL 50 MG PO TABS
75.0000 mg | ORAL_TABLET | Freq: Three times a day (TID) | ORAL | Status: DC
  Administered 2021-08-31: 75 mg via ORAL
  Filled 2021-08-31: qty 1

## 2021-08-31 MED ORDER — HYDRALAZINE HCL 50 MG PO TABS
50.0000 mg | ORAL_TABLET | Freq: Three times a day (TID) | ORAL | Status: DC
  Administered 2021-08-31: 50 mg via ORAL
  Filled 2021-08-31: qty 1

## 2021-08-31 MED ORDER — INSULIN ASPART 100 UNIT/ML IJ SOLN
0.0000 [IU] | Freq: Three times a day (TID) | INTRAMUSCULAR | Status: DC
  Administered 2021-08-31: 11 [IU] via SUBCUTANEOUS
  Administered 2021-09-01: 3 [IU] via SUBCUTANEOUS

## 2021-08-31 MED ORDER — INSULIN ASPART 100 UNIT/ML IJ SOLN: 0.0000 [IU] | Freq: Every day | INTRAMUSCULAR | Status: DC

## 2021-08-31 MED ORDER — IPRATROPIUM-ALBUTEROL 0.5-2.5 (3) MG/3ML IN SOLN: 3.0000 mL | Freq: Two times a day (BID) | RESPIRATORY_TRACT | Status: DC

## 2021-08-31 MED ORDER — INSULIN GLARGINE-YFGN 100 UNIT/ML ~~LOC~~ SOLN
10.0000 [IU] | Freq: Every day | SUBCUTANEOUS | Status: DC
  Administered 2021-08-31 – 2021-09-01 (×2): 10 [IU] via SUBCUTANEOUS
  Filled 2021-08-31 (×2): qty 0.1

## 2021-08-31 MED ORDER — UMECLIDINIUM-VILANTEROL 62.5-25 MCG/ACT IN AEPB
1.0000 | INHALATION_SPRAY | Freq: Every day | RESPIRATORY_TRACT | Status: DC
  Administered 2021-09-01: 1 via RESPIRATORY_TRACT
  Filled 2021-08-31: qty 14

## 2021-08-31 NOTE — Evaluation (Signed)
Occupational Therapy Evaluation ?Patient Details ?Name: Katie Alvarado ?MRN: [id number] ?DOB: 03-Jan-1926 ?Today's Date: 08/31/2021 ? ? ?History of Present Illness Patient is a 88 year old female admitted with increasingly short of breath. Patient diagnosed Acute respiratory failure with hypoxia. PMH: COVID-19, COPD, bronchiectasis, diastolic CHF, diabetes mellitus  ? ?Clinical Impression ?  ?Patient lives alone however has aide that comes Mon-Sat 8-11 and a friend that comes in evenings to assist with whatever she needs. Patient is primarily independent with ADLs, aide will assist with bathing "if I am having trouble standing in the shower." Patient demonstrates ability to perform self care tasks listed below without physical assistance and O2 maintained 90-99% on room air. No further acute OT needs, will sign off.  ?   ? ?Recommendations for follow up therapy are one component of a multi-disciplinary discharge planning process, led by the attending physician.  Recommendations may be updated based on patient status, additional functional criteria and insurance authorization.  ? ?Follow Up Recommendations ? No OT follow up  ?  ?Assistance Recommended at Discharge PRN  ?   ?Functional Status Assessment ? Patient has not had a recent decline in their functional status  ?Equipment Recommendations ? None recommended by OT  ?  ?   ?Precautions / Restrictions Restrictions ?Weight Bearing Restrictions: No  ? ?  ? ?Mobility Bed Mobility ?Overal bed mobility: Modified Independent ?  ?  ?  ?  ?  ?  ?  ?  ? ? ? ?  ?Balance Overall balance assessment: Mild deficits observed, not formally tested ?  ?  ?  ?  ?  ?  ?  ?  ?  ?  ?  ?  ?  ?  ?  ?  ?  ?  ?   ? ?ADL either performed or assessed with clinical judgement  ? ?ADL Overall ADL's : At baseline ?  ?  ?  ?  ?  ?  ?  ?  ?  ?  ?  ?  ?  ?  ?  ?  ?  ?  ?  ?General ADL Comments: Patient able to doff/don socks, ambulate with rolling walker and perform functional transfers without  physical assistance  ? ? ? ? ?Pertinent Vitals/Pain Pain Assessment ?Pain Assessment: No/denies pain  ? ? ? ?Hand Dominance Right ?  ?Extremity/Trunk Assessment Upper Extremity Assessment ?Upper Extremity Assessment: Overall WFL for tasks assessed ?  ?Lower Extremity Assessment ?Lower Extremity Assessment: Defer to PT evaluation ?  ?Cervical / Trunk Assessment ?Cervical / Trunk Assessment: Normal ?  ?Communication Communication ?Communication: HOH ?  ?Cognition Arousal/Alertness: Awake/alert ?Behavior During Therapy: [facility] for tasks assessed/performed ?Overall Cognitive Status: Within Functional Limits for tasks assessed ?  ?  ?  ?  ?  ?  ?  ?  ?  ?  ?  ?  ?  ?  ?  ?  ?  ?  ?  ?General Comments  VSS on room air ? ?  ?   ?   ? ? ?Home Living Family/patient expects to be discharged to:: Private residence ?Living Arrangements: Alone ?Available Help at Discharge: Personal care attendant ?Type of Home: House ?Home Access: Ramped entrance ?  ?  ?Home Layout: One level ?  ?  ?Bathroom Shower/Tub: Walk-in shower ?  ?Bathroom Toilet: Handicapped height ?  ?  ?Home Equipment: Rolling Walker (2 wheels);BSC/3in1;Cane - single point;Rollator (4 wheels) ?  ?  ?  ? ?  ?Prior Functioning/Environment  Prior Level of Function : Needs assist ?  ?  ?  ?  ?  ?  ?Mobility Comments: typically ambulatory with rollator ?ADLs Comments: Nurse comes 2x week, aid comes 8-11 fixes breakfast can help with showers aid comes Monday-Saturday, Fritz Pickerel neighbor gets dinner for her/stays "long as I need him, sometimes stays the night if I need him." ?  ? ?  ?  ?OT Problem List: Decreased activity tolerance ?  ?   ?   ?OT Goals(Current goals can be found in the care plan section) Acute Rehab OT Goals ?Patient Stated Goal: Feel better ?OT Goal Formulation: All assessment and education complete, DC therapy  ? ?AM-PAC OT "6 Clicks" Daily Activity     ?Outcome Measure Help from another person eating meals?: None ?Help from another person taking care of personal  grooming?: None ?Help from another person toileting, which includes using toliet, bedpan, or urinal?: None ?Help from another person bathing (including washing, rinsing, drying)?: None ?Help from another person to put on and taking off regular upper body clothing?: None ?Help from another person to put on and taking off regular lower body clothing?: None ?6 Click Score: 24 ?  ?End of Session Equipment Utilized During Treatment: Rolling walker (2 wheels) ?Nurse Communication: Mobility status ? ?Activity Tolerance: Patient tolerated treatment well ?Patient left: in chair;with call bell/phone within reach ? ?OT Visit Diagnosis: Other abnormalities of gait and mobility (R26.89)  ?              ?Time: 6468-0321 ?OT Time Calculation (min): 25 min ?Charges:  OT General Charges ?$OT Visit: 1 Visit ?OT Evaluation ?$OT Eval Low Complexity: 1 Low ? ?Delbert Phenix OT ?OT pager: [phone] ? ?Rosemary Holms ?08/31/2021, 12:44 PM ?

## 2021-08-31 NOTE — Consult Note (Signed)
Referring Provider: [facility] ?Primary Care Physician:  [name], MD ?Primary Gastroenterologist: [facility] ? ?Reason for Consultation: Abnormal imaging ? ? ?HPI: Katie Alvarado is a 88 y.o. female with history of COPD, bronchiectasis, diastolic CHF, diabetes mellitus presents to the ER after patient was found to be increasingly short of breath over the last 24 hours by patient's son.  ? 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  No evidence of metastatic disease in the abdomen. ? ?Patient denies any abdominal pain, nausea, vomiting, diarrhea, hematemesis, melena, hematochezia.  Does note at times she is constipated.  Patient has been tolerating food well.   ? ?She has no significant family history of colon cancer or pancreatic cancer. ? ?She has a daughter who lives in [place] and a son who lives in [place].  She lives at home but has people who help her take care of some activities of daily living. ? ?Past Medical History:  ?Diagnosis Date  ? Acute nasopharyngitis (common cold)   ? Arthritis   ? Bronchiectasis with acute exacerbation ([place])   ? Cancer [facility])   ? skin cancer  ? COPD (chronic obstructive pulmonary disease) ([place])   ? sees dr. Annamaria Boots   ? Cough   ? Diabetes mellitus   ? fasting 130-150  ? Dizziness   ? Esophageal reflux   ? History of radiation therapy 10/24/17- 12/05/17  ? Lower lip, 4.4 Gy X 10 fractions given twice weekly for a total dose of 44 Gy.  ? Hypertension   ? Insomnia, unspecified   ? Other symptoms involving nervous and musculoskeletal systems(781.99)   ? Pneumonia   ? hx of  ? Shortness of breath   ? exertion  ? ? ?Past Surgical History:  ?Procedure Laterality Date  ? ABDOMINAL HYSTERECTOMY    ? ANKLE FRACTURE SURGERY Left   ? APPENDECTOMY    ? BASAL CELL CARCINOMA EXCISION    ? NOSE  ? CATARACT EXTRACTION, BILATERAL    ? OUT PATIENT  ? COLON SURGERY    ? colon resection for diverticulitis  ? IR  EPIDUROGRAPHY  05/07/2018  ? LUMBAR LAMINECTOMY/DECOMPRESSION MICRODISCECTOMY N/A 02/15/2013  ? Procedure: LUMBAR LAMINECTOMY/DECOMPRESSION MICRODISCECTOMY LUMBAR FOUR-FIVE;  Surgeon: Otilio Connors, MD;  Location: [facility];  Service: Neurosurgery;  Laterality: N/A;  ? VARICOSE VEIN SURGERY    ? ? ?Prior to Admission medications   ?Medication Sig Start Date End Date Taking? Authorizing Provider  ?acetaminophen (TYLENOL) 325 MG tablet Take 325 mg by mouth every 6 (six) hours as needed for mild pain.   Yes [provider]  ?albuterol (VENTOLIN HFA) 108 (90 Base) MCG/ACT inhaler INHALE 2 PUFFS INTO THE LUNGS EVERY 6 HOURS AS NEEDED FOR WHEEZING OR SHORTNESS OF BREATH ?Patient taking differently: Inhale 2 puffs into the lungs every 6 (six) hours as needed for wheezing or shortness of breath. 06/18/21  Yes Young, Tarri Fuller D, MD  ?ALPRAZolam Duanne Moron) 0.25 MG tablet Take 0.25 mg by mouth 3 (three) times daily as needed for anxiety or sleep.   Yes [provider]  ?aspirin EC 81 MG tablet Take 81 mg by mouth daily. Swallow whole.   Yes [provider]  ?benzonatate (TESSALON) 200 MG capsule Take 1 capsule (200 mg total) by mouth 3 (three) times daily as needed for cough. 02/21/21  Yes Young, Tarri Fuller D, MD  ?budesonide (PULMICORT) 0.25 MG/2ML nebulizer solution USE 1 VIAL  IN  NEBULIZER TWICE  DAILY - rinse mouth after treatment ?Patient taking differently: Take 0.25 mg by nebulization daily. 06/30/20  Yes Young, Tarri Fuller D, MD  ?carboxymethylcellulose (REFRESH PLUS) 0.5 % SOLN Place 1 drop into both eyes as needed (dry eyes).   Yes [provider]  ?carvedilol (COREG) 12.5 MG tablet Take 12.5 mg by mouth 2 (two) times daily with a meal.   Yes [provider]  ?cholecalciferol (VITAMIN D) 1000 UNITS tablet Take 1,000 Units by mouth daily.   Yes [provider]  ?Cinnamon 500 MG capsule Take 1,000 mg by mouth 2 (two) times daily as needed (for sugar levels).   Yes [provider]  ?fluocinolone (SYNALAR) 0.01 % external solution Apply 1 application. topically 2 (two) times daily as needed for irritation. 04/13/21  Yes [provider]  ?furosemide (LASIX) 20 MG tablet Take 20 mg by mouth daily.   Yes [provider]  ?glipiZIDE (GLUCOTROL) 5 MG tablet Take 5 mg by mouth every morning. 02/14/21  Yes [provider]  ?hydrALAZINE (APRESOLINE) 50 MG tablet Take 50 mg by mouth 3 (three) times daily. 03/12/21  Yes [provider]  ?irbesartan (AVAPRO) 300 MG tablet TAKE 1 TABLET BY MOUTH EVERY DAY ?Patient taking differently: Take 300 mg by mouth daily. 03/29/21  Yes Jerline Pain, MD  ?ketoconazole (NIZORAL) 2 % shampoo Apply 1 application topically See admin instructions. Apply to scalp 2 to 3 times per week. 09/25/20  Yes [provider]  ?meclizine (ANTIVERT) 12.5 MG tablet Take 12.5 mg by mouth 3 (three) times daily as needed for dizziness.   Yes [provider]  ?Multiple Vitamins-Minerals (PRESERVISION AREDS 2 PO) Take 1 tablet by mouth in the morning and at bedtime.   Yes [provider]  ?ondansetron (ZOFRAN) 4 MG tablet Take 4 mg by mouth every 6 (six) hours as needed for nausea or vomiting. 10/11/20  Yes [provider]  ?vitamin E 400 UNIT capsule Take 400 Units by mouth 2 (two) times a week.   Yes [provider]  ?acetaminophen-codeine (TYLENOL #2) 300-15 MG tablet 1 every 6 hours if needed for pain or cough ?Patient not taking: Reported on 06/25/2021 02/21/21   Deneise Lever, MD  ?cephALEXin (KEFLEX) 500 MG capsule Take 1 capsule (500 mg total) by mouth 4 (four) times daily. ?Patient not taking: Reported on 08/30/2021 08/22/21   Valarie Merino, MD  ?esomeprazole (NEXIUM) 40 MG capsule Take 40 mg by mouth daily.    [provider]  ?ipratropium (ATROVENT) 0.02 % nebulizer solution USE 1 VIAL IN NEBULIZER 4 TIMES DAILY 07/05/21   Deneise Lever, MD  ?Respiratory Therapy Supplies  (FLUTTER) DEVI Use as directed 11/03/15   Baird Lyons D, MD  ? ? ?Scheduled Meds: ? aspirin EC  81 mg Oral Daily  ? budesonide (PULMICORT) nebulizer solution  0.25 mg Nebulization BID  ? enoxaparin (LOVENOX) injection  40 mg Subcutaneous Q24H  ? hydrALAZINE  75 mg Oral TID  ? insulin aspart  0-9 Units Subcutaneous TID WC  ? ipratropium-albuterol  3 mL Nebulization BID  ? methylPREDNISolone (SOLU-MEDROL) injection  40 mg Intravenous Q12H  ? metoprolol tartrate  50 mg Oral BID  ? pantoprazole  40 mg Oral Daily  ? ?Continuous Infusions: ? azithromycin 500 mg (08/31/21 0617)  ? ?PRN Meds:.acetaminophen **OR** acetaminophen, albuterol, ALPRAZolam, guaiFENesin-dextromethorphan ? ?Allergies as of 08/29/2021 - Review Complete 08/29/2021  ?Allergen Reaction Noted  ? Welchol [colesevelam hcl] Hives 04/09/2011  ?  Amlodipine  02/21/2020  ? Atorvastatin  09/14/2020  ? Coreg [carvedilol]  02/21/2020  ? Doxycycline  02/21/2020  ? Glucosamine Other (See Comments) 05/20/2007  ? Hydrocodone-acetaminophen  03/05/2021  ? Mobic [meloxicam]  02/21/2020  ? Sulfa antibiotics  02/21/2020  ? Telmisartan  02/21/2020  ? Tramadol Nausea Only 02/09/2013  ? Celecoxib Hives, Itching, and Rash 04/20/2007  ? Statins Rash 02/09/2013  ? ? ?Family History  ?Problem Relation Age of Onset  ? Chronic bronchitis Father   ? Other Sister   ?     heart trouble  ? Other Brother   ?     heart trouble  ? ? ?Social History  ? ?Socioeconomic History  ? Marital status: Widowed  ?  Spouse name: Not on file  ? Number of children: 2  ? Years of education: Not on file  ? Highest education level: Not on file  ?Occupational History  ? Not on file  ?Tobacco Use  ? Smoking status: Never  ? Smokeless tobacco: Never  ?Vaping Use  ? Vaping Use: Never used  ?Substance and Sexual Activity  ? Alcohol use: Yes  ?  Comment: "Hardly Ever"   ? Drug use: No  ? Sexual activity: Not Currently  ?  Birth control/protection: Post-menopausal  ?Other Topics Concern  ? Not on file  ?Social  History Narrative  ? Not on file  ? ?Social Determinants of Health  ? ?Financial Resource Strain: Not on file  ?Food Insecurity: Not on file  ?Transportation Needs: Not on file  ?Physical Activity: Not on fi

## 2021-08-31 NOTE — Evaluation (Signed)
Physical Therapy Evaluation ?Patient Details ?Name: Katie Alvarado ?MRN: [id number] ?DOB: Mar 25, 1926 ?Today's Date: 08/31/2021 ? ?History of Present Illness ? Patient is a 88 year old female admitted with increasingly short of breath. Patient diagnosed Acute respiratory failure with hypoxia. PMH: COVID-19, COPD, bronchiectasis, diastolic CHF, diabetes mellitus  ?Clinical Impression ? Pt admitted as above and presenting with functional mobility limitations 2* decreased endurance, generalized weakness, and mild ambulatory balance deficits.  Pt reports feeling much better but not quite at baseline for function.  Pt should progress to dc home and reports significant assist available to her there.  This date pt up to ambulate in hall and maintained O2 sats 90% and higher on RA.  Pt reports min SOB with activity. ?   ? ?Recommendations for follow up therapy are one component of a multi-disciplinary discharge planning process, led by the attending physician.  Recommendations may be updated based on patient status, additional functional criteria and insurance authorization. ? ?Follow Up Recommendations No PT follow up ? ?  ?Assistance Recommended at Discharge Intermittent Supervision/Assistance  ?Patient can return home with the following ? A little help with walking and/or transfers;A little help with bathing/dressing/bathroom;Assistance with cooking/housework;Assist for transportation;Help with stairs or ramp for entrance ? ?  ?Equipment Recommendations None recommended by PT  ?Recommendations for Other Services ?    ?  ?Functional Status Assessment Patient has had a recent decline in their functional status and demonstrates the ability to make significant improvements in function in a reasonable and predictable amount of time.  ? ?  ?Precautions / Restrictions Restrictions ?Weight Bearing Restrictions: No  ? ?  ? ?Mobility ? Bed Mobility ?Overal bed mobility: Modified Independent ?  ?  ?  ?  ?  ?  ?  ?   ? ?Transfers ?Overall transfer level: Needs assistance ?Equipment used: Rolling walker (2 wheels) ?Transfers: Sit to/from Stand ?Sit to Stand: Min guard ?  ?  ?  ?  ?  ?General transfer comment: Steady assist only to rise from EOB ?  ? ?Ambulation/Gait ?Ambulation/Gait assistance: Min guard ?Gait Distance (Feet): 300 Feet ?Assistive device: Rolling walker (2 wheels) ?Gait Pattern/deviations: Step-to pattern, Step-through pattern, Decreased step length - right, Decreased step length - left, Shuffle ?  ?  ?  ?General Gait Details: min cues for posture and position from RW; one standing rest break ? ?Stairs ?  ?  ?  ?  ?  ? ?Wheelchair Mobility ?  ? ?Modified Rankin (Stroke Patients Only) ?  ? ?  ? ?Balance Overall balance assessment: Mild deficits observed, not formally tested ?  ?  ?  ?  ?  ?  ?  ?  ?  ?  ?  ?  ?  ?  ?  ?  ?  ?  ?   ? ? ? ?Pertinent Vitals/Pain Pain Assessment ?Pain Assessment: No/denies pain  ? ? ?Home Living Family/patient expects to be discharged to:: Private residence ?Living Arrangements: Alone ?Available Help at Discharge: Personal care attendant ?Type of Home: House ?Home Access: Ramped entrance ?  ?  ?  ?Home Layout: One level ?Home Equipment: Rolling Walker (2 wheels);BSC/3in1;Cane - single point;Rollator (4 wheels) ?Additional Comments: has assistance with bathing  ?  ?Prior Function Prior Level of Function : Needs assist ?  ?  ?  ?  ?  ?  ?Mobility Comments: typically ambulatory with rollator ?ADLs Comments: Nurse comes 2x week, aid comes 8-11 fixes breakfast can help with showers aid comes Monday-Saturday,  Fritz Pickerel neighbor gets dinner for her/stays "long as I need him, sometimes stays the night if I need him." ?  ? ? ?Hand Dominance  ? Dominant Hand: Right ? ?  ?Extremity/Trunk Assessment  ? Upper Extremity Assessment ?Upper Extremity Assessment: Overall WFL for tasks assessed ?  ? ?Lower Extremity Assessment ?Lower Extremity Assessment: Overall WFL for tasks assessed ?  ? ?Cervical /  Trunk Assessment ?Cervical / Trunk Assessment: Normal  ?Communication  ? Communication: HOH  ?Cognition Arousal/Alertness: Awake/alert ?Behavior During Therapy: [facility] for tasks assessed/performed ?Overall Cognitive Status: Within Functional Limits for tasks assessed ?  ?  ?  ?  ?  ?  ?  ?  ?  ?  ?  ?  ?  ?  ?  ?  ?  ?  ?  ? ?  ?General Comments General comments (skin integrity, edema, etc.): VSS on room air ? ?  ?Exercises    ? ?Assessment/Plan  ?  ?PT Assessment Patient needs continued PT services  ?PT Problem List Decreased activity tolerance;Decreased balance;Decreased mobility;Decreased knowledge of use of DME ? ?   ?  ?PT Treatment Interventions DME instruction;Gait training;Stair training;Functional mobility training;Therapeutic activities;Therapeutic exercise;Patient/family education;Balance training   ? ?PT Goals (Current goals can be found in the Care Plan section)  ?Acute Rehab PT Goals ?Patient Stated Goal: Resume previous lifestyle.  Not have to take O2 home ?PT Goal Formulation: With patient ?Time For Goal Achievement: 09/14/21 ?Potential to Achieve Goals: Good ? ?  ?Frequency Min 3X/week ?  ? ? ?Co-evaluation   ?  ?  ?  ?  ? ? ?  ?AM-PAC PT "6 Clicks" Mobility  ?Outcome Measure Help needed turning from your back to your side while in a flat bed without using bedrails?: None ?Help needed moving from lying on your back to sitting on the side of a flat bed without using bedrails?: None ?Help needed moving to and from a bed to a chair (including a wheelchair)?: A Little ?Help needed standing up from a chair using your arms (e.g., wheelchair or bedside chair)?: A Little ?Help needed to walk in hospital room?: A Little ?Help needed climbing 3-5 steps with a railing? : A Little ?6 Click Score: 20 ? ?  ?End of Session Equipment Utilized During Treatment: Gait belt ?Activity Tolerance: Patient tolerated treatment well ?Patient left: in chair;with call bell/phone within reach;with chair alarm set ?Nurse  Communication: Mobility status ?PT Visit Diagnosis: Difficulty in walking, not elsewhere classified (R26.2) ?  ? ?Time: 6712-4580 ?PT Time Calculation (min) (ACUTE ONLY): 27 min ? ? ?Charges:   PT Evaluation ?$PT Eval Low Complexity: 1 Low ?  ?  ?   ? ? ?Debe Coder PT ?Acute Rehabilitation Services ?Pager [phone] ?Office [phone] ? ? ?Tashea Othman ?08/31/2021, 12:58 PM ? ?

## 2021-08-31 NOTE — Progress Notes (Signed)
?PROGRESS NOTE ? ?Katie Alvarado SFK:[id number] DOB: 1925/10/31  ? ?PCP: [name], MD ? ?Patient is from: Home.  Lives alone.  Uses walker at baseline. ? ?DOA: 08/29/2021 LOS: 1 ? ?Chief complaints ?Chief Complaint  ?Patient presents with  ? Weakness  ?  ? ?Brief Narrative / Interim history: ?88 year old F with PMH of COPD, bronchiectasis, diastolic CHF, DM-2 and chronic back pain presenting with progressive shortness of breath, generalized weakness, dyspnea on exertion and low oxygen, and admitted for "acute respiratory failure with hypoxia" due to COPD exacerbation and mucous plugging.  Reportedly saturating at 85 to 87% at home.   ? ?In ED, hypertensive to 184/114.  RR in upper 20s.  No documented desaturation but donned 2 L by [place]. Na 134.  Hgb 10.7.  BNP 126.  Troponin 4x2.  COVID-19 and influenza PCR nonreactive.  CXR without acute finding.  CT chest/abdomen/pelvis showed mucous plugging and nonspecific finding in the pancreatic tail.  MRCP and D-dimer ordered.  Patient was started on nebulizers and Zithromax, and admitted. ? ?D-dimer 0.6 (normal for age).  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  ? ?[facility] consulted and recommended outpatient follow-up for EUS. ? ?Patient has AKI likely from p.o. diuretics and ARB.   ? ?Subjective: ?Seen and examined earlier this morning.  No major events overnight of this morning.  No complaints other than productive cough with clear phlegm.  She denies hemoptysis.  Breathing has improved.  Left groin pain has resolved.  She denies nausea, vomiting or abdominal pain.  Denies UTI symptoms. ? ?Objective: ?Vitals:  ? 08/31/21 0420 08/31/21 0827 08/31/21 1044 08/31/21 1300  ?BP: 134/60   (!) 109/56  ?Pulse: 84  (!) 114 74  ?Resp: 16   14  ?Temp: 97.6 ?F (36.4 ?C)   98.1 ?F (36.7 ?C)  ?TempSrc: Oral   Oral  ?SpO2: 98% 95%   92%  ? ? ?Examination: ?GENERAL: No apparent distress.  Nontoxic. ?HEENT: MMM.  Vision and hearing grossly intact.  ?NECK: Supple.  No apparent JVD.  ?RESP:  No IWOB.  Fair aeration bilaterally.  LLL crackles. ?CVS:  RRR. Heart sounds normal.  ?ABD/GI/GU: BS+. Abd soft, NTND.  ?MSK/EXT:  Moves extremities. No apparent deformity. No edema.  ?SKIN: no apparent skin lesion or wound ?NEURO: Awake and alert. Oriented appropriately.  No apparent focal neuro deficit. ?PSYCH: Calm. Normal affect.  ? ?Procedures:  ?None ? ?Microbiology summarized: ?COVID-19 and influenza PCR nonreactive. ?Urine culture negative. ? ?Assessment and Plan: ?* Acute respiratory failure with hypoxia ([place]) ?Reportedly saturating at 85 to 87% prior to arrival.  CT chest concerning for mucous plugging.  Concern about exacerbation of COPD and bronchiectasis.  Breathing improved.  She has productive cough with clear phlegm.  Still with crackles in left lung base.  She maintained saturation > 90% with ambulation on RA. ?-Continue azithromycin, Solu-Medrol and home Pulmicort ?-Started on oral Ellipta and change DuoNebs to as needed ?-Continue azithromycin and nebulizers including Pulmicort and scheduled DuoNeb ?-Changed Coreg to metoprolol for better beta-1 selectivity ?-Continue encouraging incentive spirometry, flutter valve, OOB, ambulatory saturation, PT/OT ? ?AKI (acute kidney injury) ([place]) ?Recent Labs  ?  01/01/21 ?1538 01/02/21 ?1749 01/03/21 ?0453 01/04/21 ?4496 04/23/21 ?1122 04/25/21 ?0355 08/22/21 ?1100 08/29/21 ?2148 08/30/21 ?0458 08/31/21 ?0454  ?BUN '10 10 12 18 12 21 22 16 14 '$ 24*  ?CREATININE 0.69 0.49 0.70 0.78  0.76 0.85 0.85 0.73 0.68 1.13*  ?Could be from Lasix and Avapro.  CK within normal. ?-Discontinue Avapro and Lasix ?-Recheck in the morning ? ? ? ?Uncontrolled NIDDM-2 with hyperglycemia ?A1c 7.2%.  On low-dose glipizide at home.  Hyperglycemia likely due to steroid. ?Recent Labs  ?Lab 08/30/21 ?2055 08/31/21 ?0723 08/31/21 ?1118  08/31/21 ?1453 08/31/21 ?1637  ?GLUCAP 268* 181* 186* 264* 266*  ?-Increase SSI to moderate ?-Add basal insulin 10 units daily ?-Add NovoLog 2 units 3 times daily with meals ?-Further adjustment as appropriate. ? ?Pancreatic lesion ?Concern for pancreatic lesion ?-[facility] consulted and recommended outpatient follow-up for EUS ? ?Chronic diastolic CHF (congestive heart failure) ([place]) ?TTE 12/2019 with LVEF of 60 to 65%, G1 DD. I do not think this is contributing to her breathing.  BNP 126.  Appears euvolemic on exam.  On p.o. Lasix at home. ?-Discontinue p.o. Lasix in the setting of AKI ?-Monitor intake and output, renal functions and electrolytes. ? ?Uncontrolled hypertension ?Blood pressure seems to fluctuate but improved. ?-Discontinue Avapro and Lasix in the setting of AKI ?-Changed Coreg to metoprolol for better beta-1 selectivity with metoprolol. ?-Continue hydralazine. ? ?Generalized weakness ?Multifactorial including respiratory failure, COPD and bronchiectasis.  Improved. ?-PT/OT-no need identified. ? ?Left groin pain ?Resolved.  Exam reassuring. ?-Supportive care with as needed Tylenol ? ?COPD with acute exacerbation ([place]) ?See respiratory failure ? ?Chronic back pain ?Stable. ?-Tylenol as needed ? ?Normocytic anemia ?Recent Labs  ?  01/01/21 ?1538 01/02/21 ?6203 01/03/21 ?0453 01/04/21 ?5597 04/23/21 ?1122 04/25/21 ?0355 08/22/21 ?1100 08/29/21 ?2148 08/30/21 ?0458 08/31/21 ?0454  ?HGB 10.1* 10.0* 10.4* 10.1* 10.3* 11.1* 11.1* 10.7* 10.0* 10.6*  ?H&H stable.  Ferritin 28 suggesting some degree of iron deficiency. ?-We will give IV iron x1 prior to discharge ? ? ?Hyponatremia ?Stable.  Continue monitoring ? ? ? ?DVT prophylaxis:  ?enoxaparin (LOVENOX) injection 40 mg Start: 08/30/21 1000 ? ?Code Status: DNR ?Family Communication: None at bedside today. ?Level of care: Med-Surg ?Status is: Inpatient ?The patient will remain inpatient because: COPD exacerbation and AKI ? ? ?Final disposition:  Home. ?Consultants:  ?None ? ?Sch Meds:  ?Scheduled Meds: ? aspirin EC  81 mg Oral Daily  ? budesonide (PULMICORT) nebulizer solution  0.25 mg Nebulization BID  ? enoxaparin (LOVENOX) injection  40 mg Subcutaneous Q24H  ? hydrALAZINE  50 mg Oral TID  ? insulin aspart  0-15 Units Subcutaneous TID WC  ? insulin aspart  0-5 Units Subcutaneous QHS  ? insulin aspart  2 Units Subcutaneous TID WC  ? insulin glargine-yfgn  10 Units Subcutaneous Daily  ? methylPREDNISolone (SOLU-MEDROL) injection  40 mg Intravenous Q12H  ? metoprolol tartrate  50 mg Oral BID  ? pantoprazole  40 mg Oral Daily  ? umeclidinium-vilanterol  1 puff Inhalation Daily  ? ?Continuous Infusions: ? azithromycin 500 mg (08/31/21 0617)  ? ?PRN Meds:.acetaminophen **OR** acetaminophen, ALPRAZolam, guaiFENesin-dextromethorphan, ipratropium-albuterol ? ?Antimicrobials: ?Anti-infectives (From admission, onward)  ? ? Start     Dose/Rate Route Frequency Ordered Stop  ? 08/30/21 0630  azithromycin (ZITHROMAX) 500 mg in sodium chloride 0.9 % 250 mL IVPB       ? 500 mg ?250 mL/hr over 60 Minutes Intravenous Every 24 hours 08/30/21 0618    ? ?  ? ? ? ?I have personally reviewed the following labs and images: ?CBC: ?Recent Labs  ?Lab 08/29/21 ?2148 08/30/21 ?0458 08/31/21 ?0454  ?WBC 9.3 6.7 6.9  ?NEUTROABS 5.7 3.5  --   ?HGB 10.7* 10.0* 10.6*  ?HCT  32.3* 30.8* 33.7*  ?MCV 86.8 87.0 87.8  ?PLT 250 228 269  ? ?BMP &GFR ?Recent Labs  ?Lab 08/29/21 ?2148 08/30/21 ?0458 08/31/21 ?0454  ?NA 134* 134* 131*  ?K 3.7 3.7 4.4  ?CL 99 100 95*  ?CO2 '27 27 24  '$ ?GLUCOSE 121* 119* 179*  ?BUN 16 14 24*  ?CREATININE 0.73 0.68 1.13*  ?CALCIUM 8.9 8.8* 9.2  ?MG  --   --  1.9  ?PHOS  --   --  4.8*  ? ?Estimated Creatinine Clearance: 28 mL/min (A) (by C-G formula based on SCr of 1.13 mg/dL (H)). ?Liver & Pancreas: ?Recent Labs  ?Lab 08/29/21 ?2148 08/31/21 ?0454  ?AST 14*  --   ?ALT 11  --   ?ALKPHOS 56  --   ?BILITOT 0.8  --   ?PROT 6.3*  --   ?ALBUMIN 3.3* 3.2*  ? ?Recent Labs  ?Lab  08/29/21 ?2148  ?LIPASE 49  ? ?No results for input(s): AMMONIA in the last 168 hours. ?Diabetic: ?Recent Labs  ?  08/31/21 ?0454  ?HGBA1C 7.2*  ? ?Recent Labs  ?Lab 08/30/21 ?2055 08/31/21 ?2836 08/31/21 ?1118 08/31/21

## 2021-08-31 NOTE — Assessment & Plan Note (Addendum)
Recent Labs  ?  01/02/21 ?9518 01/03/21 ?0453 01/04/21 ?8416 04/23/21 ?1122 04/25/21 ?0355 08/22/21 ?1100 08/29/21 ?2148 08/30/21 ?0458 08/31/21 ?6063 09/01/21 ?0601  ?BUN '10 12 18 12 21 22 16 14 '$ 24* 30*  ?CREATININE 0.49 0.70 0.78 0.76 0.85 0.85 0.73 0.68 1.13* 0.83  ?Could be from Lasix and Avapro.  CK within normal. ?-Hold Avapro and Lasix for 2 more days. ?-Recheck renal function at follow-up. ? ? ?

## 2021-08-31 NOTE — Progress Notes (Signed)
?  Transition of Care (TOC) Screening Note ? ? ?Patient Details  ?Name: Katie Alvarado ?Date of Birth: [birth year] ? ? ?Transition of Care (TOC) CM/SW Contact:    ?Shawndale Kilpatrick, LCSW ?Phone Number: ?08/31/2021, 12:57 PM ? ? ? ?Transition of Care Department [facility]) has reviewed patient and no TOC needs have been identified at this time. We will continue to monitor patient advancement through interdisciplinary progression rounds. If new patient transition needs arise, please place a TOC consult. ? ? ?

## 2021-09-01 DIAGNOSIS — I5032 Chronic diastolic (congestive) heart failure: Secondary | ICD-10-CM | POA: Diagnosis not present

## 2021-09-01 DIAGNOSIS — J9601 Acute respiratory failure with hypoxia: Secondary | ICD-10-CM | POA: Diagnosis not present

## 2021-09-01 DIAGNOSIS — N179 Acute kidney failure, unspecified: Secondary | ICD-10-CM | POA: Diagnosis not present

## 2021-09-01 DIAGNOSIS — E1165 Type 2 diabetes mellitus with hyperglycemia: Secondary | ICD-10-CM | POA: Diagnosis not present

## 2021-09-01 LAB — CBC
HCT: 30.7 % — ABNORMAL LOW (ref 36.0–46.0)
Hemoglobin: 10 g/dL — ABNORMAL LOW (ref 12.0–15.0)
MCH: 28.2 pg (ref 26.0–34.0)
MCHC: 32.6 g/dL (ref 30.0–36.0)
MCV: 86.5 fL (ref 80.0–100.0)
Platelets: 291 10*3/uL (ref 150–400)
RBC: 3.55 MIL/uL — ABNORMAL LOW (ref 3.87–5.11)
RDW: 13.1 % (ref 11.5–15.5)
WBC: 9.6 10*3/uL (ref 4.0–10.5)
nRBC: 0 % (ref 0.0–0.2)

## 2021-09-01 LAB — RENAL FUNCTION PANEL
Albumin: 3.2 g/dL — ABNORMAL LOW (ref 3.5–5.0)
Anion gap: 10 (ref 5–15)
BUN: 30 mg/dL — ABNORMAL HIGH (ref 8–23)
CO2: 25 mmol/L (ref 22–32)
Calcium: 9.2 mg/dL (ref 8.9–10.3)
Chloride: 96 mmol/L — ABNORMAL LOW (ref 98–111)
Creatinine, Ser: 0.83 mg/dL (ref 0.44–1.00)
GFR, Estimated: >60 mL/min (ref >60)
Glucose, Bld: 234 mg/dL — ABNORMAL HIGH (ref 70–99)
Phosphorus: 3.6 mg/dL (ref 2.5–4.6)
Potassium: 4.2 mmol/L (ref 3.5–5.1)
Sodium: 131 mmol/L — ABNORMAL LOW (ref 135–145)

## 2021-09-01 LAB — GLUCOSE, CAPILLARY: Glucose-Capillary: 198 mg/dL — ABNORMAL HIGH (ref 70–99)

## 2021-09-01 LAB — MAGNESIUM: Magnesium: 1.8 mg/dL (ref 1.7–2.4)

## 2021-09-01 MED ORDER — AZITHROMYCIN 250 MG PO TABS: 250.0000 mg | ORAL_TABLET | Freq: Every day | ORAL | 0 refills | Status: DC

## 2021-09-01 MED ORDER — PREDNISONE 10 MG PO TABS: 30.0000 mg | ORAL_TABLET | Freq: Every day | ORAL | 0 refills | Status: AC

## 2021-09-01 MED ORDER — FUROSEMIDE 20 MG PO TABS: 20.0000 mg | ORAL_TABLET | Freq: Every day | ORAL | Status: DC

## 2021-09-01 MED ORDER — HYDRALAZINE HCL 50 MG PO TABS
75.0000 mg | ORAL_TABLET | Freq: Three times a day (TID) | ORAL | Status: DC
  Administered 2021-09-01: 75 mg via ORAL
  Filled 2021-09-01: qty 1

## 2021-09-01 MED ORDER — MENTHOL 3 MG MT LOZG
1.0000 | LOZENGE | OROMUCOSAL | Status: DC | PRN
  Administered 2021-09-01: 3 mg via ORAL
  Filled 2021-09-01: qty 9

## 2021-09-01 MED ORDER — METOPROLOL TARTRATE 50 MG PO TABS: 50.0000 mg | ORAL_TABLET | Freq: Two times a day (BID) | ORAL | 1 refills | Status: DC

## 2021-09-01 MED ORDER — IRBESARTAN 300 MG PO TABS: 300.0000 mg | ORAL_TABLET | Freq: Every day | ORAL | 1 refills | Status: DC

## 2021-09-01 NOTE — Plan of Care (Signed)

## 2021-09-01 NOTE — Discharge Summary (Signed)
Home ? ?Physician Discharge Summary  ?Katie Alvarado WYO:[id number] DOB: [birth year] DOA: 08/29/2021 ? ?PCP: Katie Cruel, MD ? ?Admit date: 08/29/2021 ?Discharge date: 09/01/2021 ?Admitted From: Home ?Disposition: Home ?Recommendations for Outpatient Follow-up:  ?Follow ups as below. ?Please obtain CBC/BMP/Mag at follow up ?Reassess blood pressure and adjust antihypertensive as appropriate ?Reassess respiratory status.  May consider LABA/LAMA/ICS ?Please follow up on the following pending results: None ? ?Home Health: Not indicated ?Equipment/Devices: Not indicated ? ?Discharge Condition: Stable ?CODE STATUS: DNR/DNI ? Follow-up Information   ? ? Katie Cruel, MD. Schedule an appointment as soon as possible for a visit in 1 week(s).   ?Specialty: Family Medicine ?Contact information: ?[address] ?[place] [address] ?[phone] ? ? ?  ?  ? ? Gastroenterology, [facility]. Schedule an appointment as soon as possible for a visit in 2 week(s).   ?Contact information: ?[address] ?[address] ?[place] [address] ?[phone] ? ? ?  ?  ? ?  ?  ? ?  ? ? ?Hospital course ?88 year old F with PMH of COPD, bronchiectasis, diastolic CHF, DM-2 and chronic back pain presenting with progressive shortness of breath, generalized weakness, dyspnea on exertion and low oxygen, and admitted for "acute respiratory failure with hypoxia" due to COPD exacerbation and mucous plugging.  Reportedly saturating at 85 to 87% at home.   ? ?In ED, hypertensive to 184/114.  RR in upper 20s.  No documented desaturation but donned 2 L by [place]. Na 134.  Hgb 10.7.  BNP 126.  Troponin 4x2.  COVID-19 and influenza PCR nonreactive.  CXR without acute finding.  D-dimer 0.6 (normal for age). CT chest/abdomen/pelvis showed mucous plugging and nonspecific finding in the pancreatic tail for which MRCP was recommended.  Patient was started on Solu-Medrol, Zithromax and nebulizers for COPD exacerbation/possible bronchiectasis.  ? 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 [facility] consulted and recommended outpatient follow-up for EUS.  Patient had no GI symptoms. ? ?In regards to respiratory failure/COPD exacerbation, continued on systemic steroid, Zithromax and nebulizers with improvement in her breathing.  Eventually, she was liberated off oxygen and maintaining good saturation with ambulation on room air.  She is discharged on p.o. prednisone and Zithromax for 2 more days.  She is already on scheduled Atrovent and as needed albuterol at home.  We changed Coreg to metoprolol for better beta-1 selectivity.  ? ?Patient had mild AKI likely from p.o. diuretics and ARB.  AKI resolved after holding diuretics and ARB.  Patient to hold diuretics and ARB for 2 more days.  ? ?Patient was evaluated by therapy but no need was identified.  ? ?See individual problem list below for more on hospital course. ? ?Problems addressed during this hospitalization ?Problem  ?Acute Respiratory Failure With Hypoxia ([facility])  ?Aki (Acute Kidney Injury) ([facility])  ?Uncontrolled NIDDM-2 with hyperglycemia  ?Pancreatic Lesion  ?Chronic Diastolic Chf (Congestive Heart Failure) ([facility])  ?Uncontrolled Hypertension  ?Generalized Weakness  ?Left Groin Pain  ?Copd With Acute Exacerbation ([facility])  ?Chronic Back Pain  ?Normocytic Anemia  ?Hyponatremia  ?  ?Assessment and Plan: ?* Acute respiratory failure with hypoxia ([facility]) ?Likely due to COPD exacerbation.  Resolved.   ? ?AKI (acute kidney injury) ([place]) ?Recent Labs  ?  01/02/21 ?5465 01/03/21 ?0453 01/04/21 ?0354 04/23/21 ?1122 04/25/21 ?0355 08/22/21 ?1100 08/29/21 ?2148 08/30/21 ?0458 08/31/21 ?6568 09/01/21 ?0601  ?BUN 10 12  $'18 12 21 22 16 14 'm$ 24* 30*  ?CREATININE 0.49 0.70 0.78 0.76 0.85 0.85 0.73 0.68 1.13* 0.83  ?Could be from Lasix and Avapro.  CK within normal. ?-Hold Avapro and Lasix for 2 more  days. ?-Recheck renal function at follow-up. ? ? ? ?Uncontrolled NIDDM-2 with hyperglycemia ?A1c 7.2%.  On low-dose glipizide at home.  Hyperglycemia likely due to steroid. ?Discharged on home medications. ?Advised to hold Lasix and Avapro for 2 more days ? ?Pancreatic lesion ?Concern for pancreatic lesion ?-[facility] consulted and recommended outpatient follow-up for EUS ? ?Chronic diastolic CHF (congestive heart failure) ([place]) ?TTE 12/2019 with LVEF of 60 to 65%, G1 DD. I do not think this is contributing to her breathing.  BNP 126.  Appears euvolemic on exam.  On p.o. Lasix at home. ?-Advised to hold Lasix for 2 more days ? ?Uncontrolled hypertension ?Blood pressure seems to fluctuate but improved. ?-Changed Coreg to metoprolol for better beta-1 selectivity ?-Patient to continue home hydralazine ?-Hold Avapro and Lasix for 2 more days ? ?Generalized weakness ?Multifactorial including respiratory failure, COPD and bronchiectasis.  Improved.  No need identified by therapy. ? ?Left groin pain ?Resolved.  Exam reassuring. ? ?COPD with acute exacerbation ([place]) ?See respiratory failure ? ?Chronic back pain ?Stable. ?-Tylenol as needed ? ?Normocytic anemia ?Recent Labs  ?  01/02/21 ?5859 01/03/21 ?0453 01/04/21 ?2924 04/23/21 ?1122 04/25/21 ?0355 08/22/21 ?1100 08/29/21 ?2148 08/30/21 ?0458 08/31/21 ?4628 09/01/21 ?0601  ?HGB 10.0* 10.4* 10.1* 10.3* 11.1* 11.1* 10.7* 10.0* 10.6* 10.0*  ?H&H relatively stable. ?Recheck CBC at follow-up ? ? ?Hyponatremia ?Stable.  Recheck at follow-up. ? ? ? ? ?Vital signs ?Vitals:  ? 08/31/21 2032 09/01/21 0509 09/01/21 0534 09/01/21 0819  ?BP: (!) 158/67 (!) 166/63    ?Pulse: 79 73    ?Temp: 97.6 ?F (36.4 ?C) 98.1 ?F (36.7 ?C)    ?Resp:      ?Height:   '5\' 8"'$  (1.727 m)   ?Weight:   55.6 kg   ?SpO2: 92% 100%  92%  ?TempSrc: Oral Oral    ?BMI (Calculated):   18.64   ?  ? ?Discharge exam ? ?GENERAL: No apparent distress.  Nontoxic. ?HEENT: MMM.  Vision and hearing grossly intact.  ?NECK:  Supple.  No apparent JVD.  ?RESP: 92% on RA.  No IWOB.  Fair aeration bilaterally. ?CVS:  RRR. Heart sounds normal.  ?ABD/GI/GU: BS+. Abd soft, NTND.  ?MSK/EXT:  Moves extremities. No apparent deformity. No edema.  ?SKIN: no apparent skin lesion or wound ?NEURO: Awake and alert. Oriented appropriately.  No apparent focal neuro deficit. ?PSYCH: Calm. Normal affect.  ? ?Discharge Instructions ?Discharge Instructions   ? ? Call MD for:  difficulty breathing, headache or visual disturbances   Complete by: As directed ?  ? Call MD for:  extreme fatigue   Complete by: As directed ?  ? Call MD for:  persistant dizziness or light-headedness   Complete by: As directed ?  ? Call MD for:  severe uncontrolled pain   Complete by: As directed ?  ? Diet - low sodium heart healthy   Complete by: As directed ?  ? Discharge instructions   Complete by: As directed ?  ? It has been a pleasure taking care of you! ? ?You were hospitalized due to shortness of breath and generalized weakness likely from COPD exacerbation.  Your symptoms improved with treatment.  We are discharging you on more medication (steroid, antibiotics and inhalers) for further treatment of your COPD.  We have also made some adjustment to your blood pressure medication during this hospitalization.  Please review your new medication list and the directions on your medications before you take them. ? ?In regards to the abnormal MRI of your pancreas, please follow-up with gastroenterologist for further evaluation. ? ?Follow-up with your primary care doctor in 1 to 2 weeks or sooner if needed. ? ? ?Take care,  ? Increase activity slowly   Complete by: As directed ?  ? ?  ? ?Allergies as of 09/01/2021   ? ?   Reactions  ? Welchol [colesevelam Hcl] Hives  ? Amlodipine   ? Ankle swelling  ? Atorvastatin   ? Other reaction(s): foot pain  ? Coreg [carvedilol]   ? dizzy  ? Doxycycline   ? rash  ? Glucosamine Itching, Other (See Comments)  ? "Breakouts"  ?  Hydrocodone-acetaminophen   ? Other reaction(s): did not feel right  ? Mobic [meloxicam] Other (See Comments)  ? Unknown reaction  ? Sulfa Antibiotics   ? itching  ? Telmisartan   ? itching  ? Tramadol Nausea Only  ? *can tolerate

## 2021-09-01 NOTE — Progress Notes (Signed)
Pt discharged to home with friend. Discharge instructions and medication education provided to pt.  ?

## 2021-09-05 DIAGNOSIS — J449 Chronic obstructive pulmonary disease, unspecified: Secondary | ICD-10-CM | POA: Diagnosis not present

## 2021-09-05 DIAGNOSIS — N390 Urinary tract infection, site not specified: Secondary | ICD-10-CM | POA: Diagnosis not present

## 2021-09-05 DIAGNOSIS — K219 Gastro-esophageal reflux disease without esophagitis: Secondary | ICD-10-CM | POA: Diagnosis not present

## 2021-09-05 DIAGNOSIS — R42 Dizziness and giddiness: Secondary | ICD-10-CM | POA: Diagnosis not present

## 2021-09-05 DIAGNOSIS — G4700 Insomnia, unspecified: Secondary | ICD-10-CM | POA: Diagnosis not present

## 2021-09-05 DIAGNOSIS — E1140 Type 2 diabetes mellitus with diabetic neuropathy, unspecified: Secondary | ICD-10-CM | POA: Diagnosis not present

## 2021-09-07 DIAGNOSIS — G4700 Insomnia, unspecified: Secondary | ICD-10-CM | POA: Diagnosis not present

## 2021-09-07 DIAGNOSIS — N390 Urinary tract infection, site not specified: Secondary | ICD-10-CM | POA: Diagnosis not present

## 2021-09-07 DIAGNOSIS — R42 Dizziness and giddiness: Secondary | ICD-10-CM | POA: Diagnosis not present

## 2021-09-07 DIAGNOSIS — E1140 Type 2 diabetes mellitus with diabetic neuropathy, unspecified: Secondary | ICD-10-CM | POA: Diagnosis not present

## 2021-09-07 DIAGNOSIS — J449 Chronic obstructive pulmonary disease, unspecified: Secondary | ICD-10-CM | POA: Diagnosis not present

## 2021-09-07 DIAGNOSIS — K219 Gastro-esophageal reflux disease without esophagitis: Secondary | ICD-10-CM | POA: Diagnosis not present

## 2021-09-11 DIAGNOSIS — I10 Essential (primary) hypertension: Secondary | ICD-10-CM | POA: Diagnosis not present

## 2021-09-11 DIAGNOSIS — K869 Disease of pancreas, unspecified: Secondary | ICD-10-CM | POA: Diagnosis not present

## 2021-09-11 DIAGNOSIS — M25519 Pain in unspecified shoulder: Secondary | ICD-10-CM | POA: Diagnosis not present

## 2021-09-11 DIAGNOSIS — E1149 Type 2 diabetes mellitus with other diabetic neurological complication: Secondary | ICD-10-CM | POA: Diagnosis not present

## 2021-09-12 DIAGNOSIS — N390 Urinary tract infection, site not specified: Secondary | ICD-10-CM | POA: Diagnosis not present

## 2021-09-12 DIAGNOSIS — G4700 Insomnia, unspecified: Secondary | ICD-10-CM | POA: Diagnosis not present

## 2021-09-12 DIAGNOSIS — E1140 Type 2 diabetes mellitus with diabetic neuropathy, unspecified: Secondary | ICD-10-CM | POA: Diagnosis not present

## 2021-09-12 DIAGNOSIS — J449 Chronic obstructive pulmonary disease, unspecified: Secondary | ICD-10-CM | POA: Diagnosis not present

## 2021-09-12 DIAGNOSIS — K219 Gastro-esophageal reflux disease without esophagitis: Secondary | ICD-10-CM | POA: Diagnosis not present

## 2021-09-12 DIAGNOSIS — R42 Dizziness and giddiness: Secondary | ICD-10-CM | POA: Diagnosis not present

## 2021-09-14 DIAGNOSIS — G4700 Insomnia, unspecified: Secondary | ICD-10-CM | POA: Diagnosis not present

## 2021-09-14 DIAGNOSIS — E1140 Type 2 diabetes mellitus with diabetic neuropathy, unspecified: Secondary | ICD-10-CM | POA: Diagnosis not present

## 2021-09-14 DIAGNOSIS — R42 Dizziness and giddiness: Secondary | ICD-10-CM | POA: Diagnosis not present

## 2021-09-14 DIAGNOSIS — J449 Chronic obstructive pulmonary disease, unspecified: Secondary | ICD-10-CM | POA: Diagnosis not present

## 2021-09-14 DIAGNOSIS — K219 Gastro-esophageal reflux disease without esophagitis: Secondary | ICD-10-CM | POA: Diagnosis not present

## 2021-09-14 DIAGNOSIS — N390 Urinary tract infection, site not specified: Secondary | ICD-10-CM | POA: Diagnosis not present

## 2021-09-20 DIAGNOSIS — G4700 Insomnia, unspecified: Secondary | ICD-10-CM | POA: Diagnosis not present

## 2021-09-20 DIAGNOSIS — R42 Dizziness and giddiness: Secondary | ICD-10-CM | POA: Diagnosis not present

## 2021-09-20 DIAGNOSIS — J449 Chronic obstructive pulmonary disease, unspecified: Secondary | ICD-10-CM | POA: Diagnosis not present

## 2021-09-20 DIAGNOSIS — N390 Urinary tract infection, site not specified: Secondary | ICD-10-CM | POA: Diagnosis not present

## 2021-09-20 DIAGNOSIS — E1140 Type 2 diabetes mellitus with diabetic neuropathy, unspecified: Secondary | ICD-10-CM | POA: Diagnosis not present

## 2021-09-20 DIAGNOSIS — K219 Gastro-esophageal reflux disease without esophagitis: Secondary | ICD-10-CM | POA: Diagnosis not present

## 2021-09-26 DIAGNOSIS — Z7951 Long term (current) use of inhaled steroids: Secondary | ICD-10-CM | POA: Diagnosis not present

## 2021-09-26 DIAGNOSIS — Z7984 Long term (current) use of oral hypoglycemic drugs: Secondary | ICD-10-CM | POA: Diagnosis not present

## 2021-09-26 DIAGNOSIS — J470 Bronchiectasis with acute lower respiratory infection: Secondary | ICD-10-CM | POA: Diagnosis not present

## 2021-09-26 DIAGNOSIS — Z7982 Long term (current) use of aspirin: Secondary | ICD-10-CM | POA: Diagnosis not present

## 2021-09-26 DIAGNOSIS — R600 Localized edema: Secondary | ICD-10-CM | POA: Diagnosis not present

## 2021-09-26 DIAGNOSIS — K219 Gastro-esophageal reflux disease without esophagitis: Secondary | ICD-10-CM | POA: Diagnosis not present

## 2021-09-26 DIAGNOSIS — J9601 Acute respiratory failure with hypoxia: Secondary | ICD-10-CM | POA: Diagnosis not present

## 2021-09-26 DIAGNOSIS — J471 Bronchiectasis with (acute) exacerbation: Secondary | ICD-10-CM | POA: Diagnosis not present

## 2021-09-26 DIAGNOSIS — Z7189 Other specified counseling: Secondary | ICD-10-CM | POA: Diagnosis not present

## 2021-09-26 DIAGNOSIS — R42 Dizziness and giddiness: Secondary | ICD-10-CM | POA: Diagnosis not present

## 2021-09-26 DIAGNOSIS — J209 Acute bronchitis, unspecified: Secondary | ICD-10-CM | POA: Diagnosis not present

## 2021-09-26 DIAGNOSIS — I110 Hypertensive heart disease with heart failure: Secondary | ICD-10-CM | POA: Diagnosis not present

## 2021-09-26 DIAGNOSIS — I701 Atherosclerosis of renal artery: Secondary | ICD-10-CM | POA: Diagnosis not present

## 2021-09-26 DIAGNOSIS — G4700 Insomnia, unspecified: Secondary | ICD-10-CM | POA: Diagnosis not present

## 2021-09-26 DIAGNOSIS — D509 Iron deficiency anemia, unspecified: Secondary | ICD-10-CM | POA: Diagnosis not present

## 2021-09-26 DIAGNOSIS — M1990 Unspecified osteoarthritis, unspecified site: Secondary | ICD-10-CM | POA: Diagnosis not present

## 2021-09-26 DIAGNOSIS — I5032 Chronic diastolic (congestive) heart failure: Secondary | ICD-10-CM | POA: Diagnosis not present

## 2021-09-26 DIAGNOSIS — E1121 Type 2 diabetes mellitus with diabetic nephropathy: Secondary | ICD-10-CM | POA: Diagnosis not present

## 2021-09-26 DIAGNOSIS — R0609 Other forms of dyspnea: Secondary | ICD-10-CM | POA: Diagnosis not present

## 2021-09-26 DIAGNOSIS — R5383 Other fatigue: Secondary | ICD-10-CM | POA: Diagnosis not present

## 2021-09-26 DIAGNOSIS — H548 Legal blindness, as defined in USA: Secondary | ICD-10-CM | POA: Diagnosis not present

## 2021-09-26 DIAGNOSIS — Z87440 Personal history of urinary (tract) infections: Secondary | ICD-10-CM | POA: Diagnosis not present

## 2021-09-26 DIAGNOSIS — I2510 Atherosclerotic heart disease of native coronary artery without angina pectoris: Secondary | ICD-10-CM | POA: Diagnosis not present

## 2021-09-26 DIAGNOSIS — E1165 Type 2 diabetes mellitus with hyperglycemia: Secondary | ICD-10-CM | POA: Diagnosis not present

## 2021-09-26 DIAGNOSIS — Z85828 Personal history of other malignant neoplasm of skin: Secondary | ICD-10-CM | POA: Diagnosis not present

## 2021-09-26 DIAGNOSIS — E1140 Type 2 diabetes mellitus with diabetic neuropathy, unspecified: Secondary | ICD-10-CM | POA: Diagnosis not present

## 2021-09-27 ENCOUNTER — Other Ambulatory Visit: Payer: Self-pay

## 2021-09-27 ENCOUNTER — Emergency Department (HOSPITAL_BASED_OUTPATIENT_CLINIC_OR_DEPARTMENT_OTHER): Payer: Medicare Other | Admitting: Radiology

## 2021-09-27 ENCOUNTER — Emergency Department (HOSPITAL_BASED_OUTPATIENT_CLINIC_OR_DEPARTMENT_OTHER)
Admission: EM | Admit: 2021-09-27 | Discharge: 2021-09-27 | Disposition: A | Discharge status: Home / Self Care | Payer: Medicare Other | Attending: Emergency Medicine | Admitting: Emergency Medicine

## 2021-09-27 ENCOUNTER — Ambulatory Visit (HOSPITAL_COMMUNITY)
Admission: EM | Admit: 2021-09-27 | Discharge: 2021-09-27 | Disposition: A | Discharge status: Home / Self Care | Payer: Medicare Other

## 2021-09-27 ENCOUNTER — Encounter (HOSPITAL_BASED_OUTPATIENT_CLINIC_OR_DEPARTMENT_OTHER): Payer: Self-pay

## 2021-09-27 DIAGNOSIS — E119 Type 2 diabetes mellitus without complications: Secondary | ICD-10-CM | POA: Insufficient documentation

## 2021-09-27 DIAGNOSIS — Z85828 Personal history of other malignant neoplasm of skin: Secondary | ICD-10-CM | POA: Diagnosis not present

## 2021-09-27 DIAGNOSIS — I10 Essential (primary) hypertension: Secondary | ICD-10-CM | POA: Insufficient documentation

## 2021-09-27 DIAGNOSIS — S8012XA Contusion of left lower leg, initial encounter: Secondary | ICD-10-CM | POA: Diagnosis not present

## 2021-09-27 DIAGNOSIS — T148XXA Other injury of unspecified body region, initial encounter: Secondary | ICD-10-CM

## 2021-09-27 DIAGNOSIS — X58XXXA Exposure to other specified factors, initial encounter: Secondary | ICD-10-CM | POA: Diagnosis not present

## 2021-09-27 DIAGNOSIS — S8992XA Unspecified injury of left lower leg, initial encounter: Secondary | ICD-10-CM | POA: Diagnosis present

## 2021-09-27 DIAGNOSIS — M79662 Pain in left lower leg: Secondary | ICD-10-CM | POA: Diagnosis not present

## 2021-09-27 DIAGNOSIS — J449 Chronic obstructive pulmonary disease, unspecified: Secondary | ICD-10-CM | POA: Insufficient documentation

## 2021-09-27 DIAGNOSIS — M7989 Other specified soft tissue disorders: Secondary | ICD-10-CM | POA: Diagnosis not present

## 2021-09-27 NOTE — Discharge Instructions (Signed)
You were evaluated in the Emergency Department and after careful evaluation, we did not find any emergent condition requiring admission or further testing in the hospital.  Your exam/testing today is overall reassuring.  Symptoms seem to be due to bruising.  You also have a small amount of blood beneath the skin.  As we discussed, your body may absorb this blood or it may come to the surface and cause some light bleeding.  Recommend cold compresses for the next 2 days, Tylenol for pain.  Please return to the Emergency Department if you experience any worsening of your condition.   Thank you for allowing [place] to be a part of your care.
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
## 2021-10-01 DIAGNOSIS — I110 Hypertensive heart disease with heart failure: Secondary | ICD-10-CM | POA: Diagnosis not present

## 2021-10-01 DIAGNOSIS — J471 Bronchiectasis with (acute) exacerbation: Secondary | ICD-10-CM | POA: Diagnosis not present

## 2021-10-01 DIAGNOSIS — E1165 Type 2 diabetes mellitus with hyperglycemia: Secondary | ICD-10-CM | POA: Diagnosis not present

## 2021-10-01 DIAGNOSIS — J9601 Acute respiratory failure with hypoxia: Secondary | ICD-10-CM | POA: Diagnosis not present

## 2021-10-01 DIAGNOSIS — J209 Acute bronchitis, unspecified: Secondary | ICD-10-CM | POA: Diagnosis not present

## 2021-10-01 DIAGNOSIS — J470 Bronchiectasis with acute lower respiratory infection: Secondary | ICD-10-CM | POA: Diagnosis not present

## 2021-10-02 DIAGNOSIS — I110 Hypertensive heart disease with heart failure: Secondary | ICD-10-CM | POA: Diagnosis not present

## 2021-10-02 DIAGNOSIS — J9601 Acute respiratory failure with hypoxia: Secondary | ICD-10-CM | POA: Diagnosis not present

## 2021-10-02 DIAGNOSIS — E1165 Type 2 diabetes mellitus with hyperglycemia: Secondary | ICD-10-CM | POA: Diagnosis not present

## 2021-10-02 DIAGNOSIS — J209 Acute bronchitis, unspecified: Secondary | ICD-10-CM | POA: Diagnosis not present

## 2021-10-02 DIAGNOSIS — J470 Bronchiectasis with acute lower respiratory infection: Secondary | ICD-10-CM | POA: Diagnosis not present

## 2021-10-02 DIAGNOSIS — J471 Bronchiectasis with (acute) exacerbation: Secondary | ICD-10-CM | POA: Diagnosis not present

## 2021-10-04 ENCOUNTER — Other Ambulatory Visit: Payer: Self-pay | Admitting: Cardiology

## 2021-10-04 DIAGNOSIS — I10 Essential (primary) hypertension: Secondary | ICD-10-CM

## 2021-10-11 DIAGNOSIS — I110 Hypertensive heart disease with heart failure: Secondary | ICD-10-CM | POA: Diagnosis not present

## 2021-10-11 DIAGNOSIS — J9601 Acute respiratory failure with hypoxia: Secondary | ICD-10-CM | POA: Diagnosis not present

## 2021-10-11 DIAGNOSIS — J209 Acute bronchitis, unspecified: Secondary | ICD-10-CM | POA: Diagnosis not present

## 2021-10-11 DIAGNOSIS — J470 Bronchiectasis with acute lower respiratory infection: Secondary | ICD-10-CM | POA: Diagnosis not present

## 2021-10-11 DIAGNOSIS — J471 Bronchiectasis with (acute) exacerbation: Secondary | ICD-10-CM | POA: Diagnosis not present

## 2021-10-11 DIAGNOSIS — E1165 Type 2 diabetes mellitus with hyperglycemia: Secondary | ICD-10-CM | POA: Diagnosis not present

## 2021-10-16 DIAGNOSIS — J471 Bronchiectasis with (acute) exacerbation: Secondary | ICD-10-CM | POA: Diagnosis not present

## 2021-10-16 DIAGNOSIS — E1165 Type 2 diabetes mellitus with hyperglycemia: Secondary | ICD-10-CM | POA: Diagnosis not present

## 2021-10-16 DIAGNOSIS — J470 Bronchiectasis with acute lower respiratory infection: Secondary | ICD-10-CM | POA: Diagnosis not present

## 2021-10-16 DIAGNOSIS — J9601 Acute respiratory failure with hypoxia: Secondary | ICD-10-CM | POA: Diagnosis not present

## 2021-10-16 DIAGNOSIS — I110 Hypertensive heart disease with heart failure: Secondary | ICD-10-CM | POA: Diagnosis not present

## 2021-10-16 DIAGNOSIS — J209 Acute bronchitis, unspecified: Secondary | ICD-10-CM | POA: Diagnosis not present

## 2021-10-25 DIAGNOSIS — J470 Bronchiectasis with acute lower respiratory infection: Secondary | ICD-10-CM | POA: Diagnosis not present

## 2021-10-25 DIAGNOSIS — I110 Hypertensive heart disease with heart failure: Secondary | ICD-10-CM | POA: Diagnosis not present

## 2021-10-25 DIAGNOSIS — J471 Bronchiectasis with (acute) exacerbation: Secondary | ICD-10-CM | POA: Diagnosis not present

## 2021-10-25 DIAGNOSIS — E1165 Type 2 diabetes mellitus with hyperglycemia: Secondary | ICD-10-CM | POA: Diagnosis not present

## 2021-10-25 DIAGNOSIS — J9601 Acute respiratory failure with hypoxia: Secondary | ICD-10-CM | POA: Diagnosis not present

## 2021-10-25 DIAGNOSIS — J209 Acute bronchitis, unspecified: Secondary | ICD-10-CM | POA: Diagnosis not present

## 2021-12-18 DIAGNOSIS — M549 Dorsalgia, unspecified: Secondary | ICD-10-CM | POA: Diagnosis not present

## 2021-12-18 DIAGNOSIS — I10 Essential (primary) hypertension: Secondary | ICD-10-CM | POA: Diagnosis not present

## 2021-12-18 DIAGNOSIS — G720 Drug-induced myopathy: Secondary | ICD-10-CM | POA: Diagnosis not present

## 2021-12-18 DIAGNOSIS — E1149 Type 2 diabetes mellitus with other diabetic neurological complication: Secondary | ICD-10-CM | POA: Diagnosis not present

## 2021-12-18 DIAGNOSIS — M25519 Pain in unspecified shoulder: Secondary | ICD-10-CM | POA: Diagnosis not present

## 2021-12-18 DIAGNOSIS — M109 Gout, unspecified: Secondary | ICD-10-CM | POA: Diagnosis not present

## 2021-12-18 DIAGNOSIS — Z0000 Encounter for general adult medical examination without abnormal findings: Secondary | ICD-10-CM | POA: Diagnosis not present

## 2021-12-18 DIAGNOSIS — R1310 Dysphagia, unspecified: Secondary | ICD-10-CM | POA: Diagnosis not present

## 2021-12-18 DIAGNOSIS — D509 Iron deficiency anemia, unspecified: Secondary | ICD-10-CM | POA: Diagnosis not present

## 2021-12-18 DIAGNOSIS — E559 Vitamin D deficiency, unspecified: Secondary | ICD-10-CM | POA: Diagnosis not present

## 2021-12-18 DIAGNOSIS — E785 Hyperlipidemia, unspecified: Secondary | ICD-10-CM | POA: Diagnosis not present

## 2021-12-18 DIAGNOSIS — F419 Anxiety disorder, unspecified: Secondary | ICD-10-CM | POA: Diagnosis not present

## 2021-12-30 NOTE — Progress Notes (Deleted)
HPI F never smoker, followed for bronchiectasis/ chronic bronchitis/ MAIC, complicated by hx of GERD, DM , arthritis, aortic atherosclerosis Sputum Cx 06/09/12 POS MAIC  Started 07/14/12- Biaxin '500mg'$  TIW, EMB 1200 TIW, Rifabutin 300 TIW  ended- 08/12/12 due to weakness and malaise,  CT chest 06/01/12- Progressive bilateral nodular bronchiectasis Office spirometry  01/18/13- moderate obstructive airways disease-FVC 2.10/69%, FEV1 1.25/58%,  FEV1/FVC  0.59/ 84%,  FEF 25-75% 0.47/33% --------------------------------------------------------------------------------     06/25/21- 88 year old female never smoker followed for Bronchiectasis/chronic bronchitis/MAIC, complicated by history GERD, DM2, arthritis,hyponatremia, aortic atherosclerosis, Lumbar DDD/ back pain, HTN, dCHF, Macular Degeneration, Covid infection [id number],  -Ventolin hfa, neb Perforomist/ Pulmicort/ Atrovent 0.02%, tessalon, tramadol, guaifenesicodeine,  Covid vax-2 Phizer Flu vax-had Hosp 1/2-1/4 Covid infection -----States she feels more sob, weakness.  Over time she is more aware of weakness, increased dyspnea with exertion, and persistent cough productive of clear mucus.   CXR reviewed. Complaining of dyspnea on exertion on arrival here today recorded room air O2 saturation was 95%. She has a flutter device used intermittently when she thinks she needs it. She has had 2 episodes of increased dizziness after taking her morning medication. She has some assistance in her home but has resisted invitations from her daughter to go live with family. CXR 04/23/21- IMPRESSION: 1. No convincing acute cardiopulmonary disease. 2. Chronic lung findings that reflect bronchiectasis, mucous plugging and interstitial thickening that are without change, better assessed on the CT dated 01/02/2021.  12/31/21- 88 year old female never smoker followed for Bronchiectasis/chronic bronchitis/MAIC, complicated by history GERD, DM2, arthritis,hyponatremia,  aortic atherosclerosis, Lumbar DDD/ back pain, HTN, dCHF, Macular Degeneration, Covid infection [id number],  -Ventolin hfa, neb  Pulmicort/ Atrovent 0.02%, tessalon, tramadol, guaifenesicodeine,  Covid vax-2 Phizer Flu vax-had Hosp 5/10- COPD exacerbation ED 6/8 hematoma L shin.  CXR 08/29/21 1V- IMPRESSION: Stable chronic changes.  No active disease.   ROS-see HPI + = positive Constitutional:     +weight loss, night sweats, no-fevers, chills,  +fatigue, lassitude. HEENT:   No-  headaches, difficulty swallowing, +tooth/dental problems, no-sore throat,       No-  sneezing, itching, ear ache, +nasal congestion, post nasal drip, + declining eyesight CV:  + chest pain, orthopnea, PND, swelling in lower extremities, anasarca,  dizziness, palpitations Resp:   +shortness of breath with exertion or at rest.             +productive cough,  + non-productive cough,  No- coughing up of blood.              No-   change in color of mucus.  No- wheezing.   Skin: No-   rash or lesions. GI:  No-   heartburn, indigestion, abdominal pain, nausea, vomiting,  GU:  MS:  +Active low back and bilateral hip pain. L rib and L shoulder pain Neuro-     complaint of generalized weakness without myalgias Psych:  No- change in mood or affect. No depression or anxiety.  No memory loss.  Obj- Physical exam General- Alert, Oriented, Frail, NAD, gracious woman, trying to be stoic,  Skin- rash-none,  excoriation- none Lymphadenopathy- none Head- atraumatic            Eyes- + decreased vision            Ears- Hearing, canals-normal            Nose- Clear, no-Septal dev, mucus, polyps, erosion, perforation             Throat- Mallampati II , mucosa  clear , drainage- none, tonsils- atrophic Neck- flexible , trachea midline, no stridor , thyroid nl, carotid no bruit Chest - symmetrical excursion , unlabored           Heart/CV- RRR , no murmur , no gallop  , no rub, nl s1 s2                           - JVD- none , edema-  none, stasis changes- none, varices- none           Lung-   Cough+ with deep breath, + coarse crackles L back, no wheeze,  dullness-none, rub-none. + no crepitus Chest wall-  Abd-  Br/ Gen/ Rectal- Not done, not indicated Extrem-   No-clubbing, none, atrophy- none, strength- nl for age. + Cool/dusky feet Neuro- grossly intact to observation

## 2021-12-31 ENCOUNTER — Ambulatory Visit: Payer: Medicare Other | Admitting: Internal Medicine

## 2022-01-29 DIAGNOSIS — M1990 Unspecified osteoarthritis, unspecified site: Secondary | ICD-10-CM | POA: Diagnosis not present

## 2022-01-29 DIAGNOSIS — I10 Essential (primary) hypertension: Secondary | ICD-10-CM | POA: Diagnosis not present

## 2022-01-29 DIAGNOSIS — I119 Hypertensive heart disease without heart failure: Secondary | ICD-10-CM | POA: Diagnosis not present

## 2022-01-29 DIAGNOSIS — E1149 Type 2 diabetes mellitus with other diabetic neurological complication: Secondary | ICD-10-CM | POA: Diagnosis not present

## 2022-01-29 DIAGNOSIS — E785 Hyperlipidemia, unspecified: Secondary | ICD-10-CM | POA: Diagnosis not present

## 2022-02-05 DIAGNOSIS — J069 Acute upper respiratory infection, unspecified: Secondary | ICD-10-CM | POA: Diagnosis not present

## 2022-02-06 ENCOUNTER — Other Ambulatory Visit: Payer: Self-pay

## 2022-02-06 ENCOUNTER — Emergency Department (HOSPITAL_COMMUNITY): Payer: Medicare Other

## 2022-02-06 ENCOUNTER — Encounter (HOSPITAL_COMMUNITY): Payer: Self-pay

## 2022-02-06 ENCOUNTER — Inpatient Hospital Stay (HOSPITAL_COMMUNITY)
Admission: EM | Admit: 2022-02-06 | Discharge: 2022-02-18 | DRG: 190 | Disposition: A | Discharge status: Skilled Nursing Facility | Payer: Medicare Other | Attending: Internal Medicine | Admitting: Internal Medicine

## 2022-02-06 DIAGNOSIS — Z882 Allergy status to sulfonamides status: Secondary | ICD-10-CM

## 2022-02-06 DIAGNOSIS — R41841 Cognitive communication deficit: Secondary | ICD-10-CM | POA: Diagnosis not present

## 2022-02-06 DIAGNOSIS — R0902 Hypoxemia: Secondary | ICD-10-CM | POA: Diagnosis not present

## 2022-02-06 DIAGNOSIS — H548 Legal blindness, as defined in USA: Secondary | ICD-10-CM | POA: Diagnosis not present

## 2022-02-06 DIAGNOSIS — I130 Hypertensive heart and chronic kidney disease with heart failure and stage 1 through stage 4 chronic kidney disease, or unspecified chronic kidney disease: Secondary | ICD-10-CM | POA: Diagnosis present

## 2022-02-06 DIAGNOSIS — Z1152 Encounter for screening for COVID-19: Secondary | ICD-10-CM

## 2022-02-06 DIAGNOSIS — J9601 Acute respiratory failure with hypoxia: Secondary | ICD-10-CM | POA: Diagnosis not present

## 2022-02-06 DIAGNOSIS — Z23 Encounter for immunization: Secondary | ICD-10-CM

## 2022-02-06 DIAGNOSIS — D631 Anemia in chronic kidney disease: Secondary | ICD-10-CM | POA: Diagnosis present

## 2022-02-06 DIAGNOSIS — Z66 Do not resuscitate: Secondary | ICD-10-CM | POA: Diagnosis present

## 2022-02-06 DIAGNOSIS — Z7984 Long term (current) use of oral hypoglycemic drugs: Secondary | ICD-10-CM

## 2022-02-06 DIAGNOSIS — J479 Bronchiectasis, uncomplicated: Secondary | ICD-10-CM | POA: Diagnosis not present

## 2022-02-06 DIAGNOSIS — Z741 Need for assistance with personal care: Secondary | ICD-10-CM | POA: Diagnosis not present

## 2022-02-06 DIAGNOSIS — Z85828 Personal history of other malignant neoplasm of skin: Secondary | ICD-10-CM

## 2022-02-06 DIAGNOSIS — N1831 Chronic kidney disease, stage 3a: Secondary | ICD-10-CM | POA: Diagnosis present

## 2022-02-06 DIAGNOSIS — M19041 Primary osteoarthritis, right hand: Secondary | ICD-10-CM | POA: Diagnosis present

## 2022-02-06 DIAGNOSIS — M810 Age-related osteoporosis without current pathological fracture: Secondary | ICD-10-CM | POA: Diagnosis not present

## 2022-02-06 DIAGNOSIS — Z885 Allergy status to narcotic agent status: Secondary | ICD-10-CM

## 2022-02-06 DIAGNOSIS — F419 Anxiety disorder, unspecified: Secondary | ICD-10-CM | POA: Diagnosis present

## 2022-02-06 DIAGNOSIS — Z923 Personal history of irradiation: Secondary | ICD-10-CM | POA: Diagnosis not present

## 2022-02-06 DIAGNOSIS — I110 Hypertensive heart disease with heart failure: Secondary | ICD-10-CM | POA: Diagnosis not present

## 2022-02-06 DIAGNOSIS — E559 Vitamin D deficiency, unspecified: Secondary | ICD-10-CM | POA: Diagnosis not present

## 2022-02-06 DIAGNOSIS — I10 Essential (primary) hypertension: Secondary | ICD-10-CM | POA: Diagnosis not present

## 2022-02-06 DIAGNOSIS — R2681 Unsteadiness on feet: Secondary | ICD-10-CM | POA: Diagnosis not present

## 2022-02-06 DIAGNOSIS — R1312 Dysphagia, oropharyngeal phase: Secondary | ICD-10-CM | POA: Diagnosis not present

## 2022-02-06 DIAGNOSIS — K219 Gastro-esophageal reflux disease without esophagitis: Secondary | ICD-10-CM | POA: Diagnosis present

## 2022-02-06 DIAGNOSIS — R0602 Shortness of breath: Secondary | ICD-10-CM | POA: Diagnosis not present

## 2022-02-06 DIAGNOSIS — Z888 Allergy status to other drugs, medicaments and biological substances status: Secondary | ICD-10-CM

## 2022-02-06 DIAGNOSIS — F411 Generalized anxiety disorder: Secondary | ICD-10-CM | POA: Diagnosis not present

## 2022-02-06 DIAGNOSIS — J310 Chronic rhinitis: Secondary | ICD-10-CM | POA: Diagnosis not present

## 2022-02-06 DIAGNOSIS — J441 Chronic obstructive pulmonary disease with (acute) exacerbation: Secondary | ICD-10-CM | POA: Diagnosis not present

## 2022-02-06 DIAGNOSIS — Z881 Allergy status to other antibiotic agents status: Secondary | ICD-10-CM | POA: Diagnosis not present

## 2022-02-06 DIAGNOSIS — J471 Bronchiectasis with (acute) exacerbation: Secondary | ICD-10-CM | POA: Diagnosis present

## 2022-02-06 DIAGNOSIS — Z7982 Long term (current) use of aspirin: Secondary | ICD-10-CM

## 2022-02-06 DIAGNOSIS — F32A Depression, unspecified: Secondary | ICD-10-CM | POA: Diagnosis present

## 2022-02-06 DIAGNOSIS — M189 Osteoarthritis of first carpometacarpal joint, unspecified: Secondary | ICD-10-CM | POA: Diagnosis not present

## 2022-02-06 DIAGNOSIS — Z7409 Other reduced mobility: Secondary | ICD-10-CM | POA: Diagnosis not present

## 2022-02-06 DIAGNOSIS — E1122 Type 2 diabetes mellitus with diabetic chronic kidney disease: Secondary | ICD-10-CM | POA: Diagnosis present

## 2022-02-06 DIAGNOSIS — Z79899 Other long term (current) drug therapy: Secondary | ICD-10-CM

## 2022-02-06 DIAGNOSIS — E876 Hypokalemia: Secondary | ICD-10-CM | POA: Diagnosis present

## 2022-02-06 DIAGNOSIS — Z7951 Long term (current) use of inhaled steroids: Secondary | ICD-10-CM

## 2022-02-06 DIAGNOSIS — I5032 Chronic diastolic (congestive) heart failure: Secondary | ICD-10-CM | POA: Diagnosis present

## 2022-02-06 DIAGNOSIS — R509 Fever, unspecified: Secondary | ICD-10-CM | POA: Diagnosis not present

## 2022-02-06 DIAGNOSIS — Z743 Need for continuous supervision: Secondary | ICD-10-CM | POA: Diagnosis not present

## 2022-02-06 DIAGNOSIS — R531 Weakness: Secondary | ICD-10-CM | POA: Diagnosis not present

## 2022-02-06 DIAGNOSIS — M6281 Muscle weakness (generalized): Secondary | ICD-10-CM | POA: Diagnosis not present

## 2022-02-06 DIAGNOSIS — E1165 Type 2 diabetes mellitus with hyperglycemia: Secondary | ICD-10-CM | POA: Diagnosis present

## 2022-02-06 DIAGNOSIS — Z9049 Acquired absence of other specified parts of digestive tract: Secondary | ICD-10-CM

## 2022-02-06 LAB — BLOOD GAS, VENOUS
Acid-Base Excess: 8 mmol/L — ABNORMAL HIGH (ref 0.0–2.0)
Bicarbonate: 33.3 mmol/L — ABNORMAL HIGH (ref 20.0–28.0)
O2 Saturation: 49.1 %
Patient temperature: 37
pCO2, Ven: 49 mmHg (ref 44–60)
pH, Ven: 7.44 — ABNORMAL HIGH (ref 7.25–7.43)
pO2, Ven: <31 mmHg — CL (ref 32–45)

## 2022-02-06 LAB — CBC WITH DIFFERENTIAL/PLATELET
Abs Immature Granulocytes: 0.02 10*3/uL (ref 0.00–0.07)
Basophils Absolute: 0.1 10*3/uL (ref 0.0–0.1)
Basophils Relative: 1 %
Eosinophils Absolute: 0.2 10*3/uL (ref 0.0–0.5)
Eosinophils Relative: 3 %
HCT: 32.8 % — ABNORMAL LOW (ref 36.0–46.0)
Hemoglobin: 10.7 g/dL — ABNORMAL LOW (ref 12.0–15.0)
Immature Granulocytes: 0 %
Lymphocytes Relative: 15 %
Lymphs Abs: 1.1 10*3/uL (ref 0.7–4.0)
MCH: 28.8 pg (ref 26.0–34.0)
MCHC: 32.6 g/dL (ref 30.0–36.0)
MCV: 88.2 fL (ref 80.0–100.0)
Monocytes Absolute: 0.8 10*3/uL (ref 0.1–1.0)
Monocytes Relative: 10 %
Neutro Abs: 5.1 10*3/uL (ref 1.7–7.7)
Neutrophils Relative %: 71 %
Platelets: 220 10*3/uL (ref 150–400)
RBC: 3.72 MIL/uL — ABNORMAL LOW (ref 3.87–5.11)
RDW: 14 % (ref 11.5–15.5)
WBC: 7.3 10*3/uL (ref 4.0–10.5)
nRBC: 0 % (ref 0.0–0.2)

## 2022-02-06 LAB — BASIC METABOLIC PANEL
Anion gap: 12 (ref 5–15)
BUN: 32 mg/dL — ABNORMAL HIGH (ref 8–23)
CO2: 29 mmol/L (ref 22–32)
Calcium: 8.7 mg/dL — ABNORMAL LOW (ref 8.9–10.3)
Chloride: 94 mmol/L — ABNORMAL LOW (ref 98–111)
Creatinine, Ser: 1.1 mg/dL — ABNORMAL HIGH (ref 0.44–1.00)
GFR, Estimated: 47 mL/min — ABNORMAL LOW (ref >60)
Glucose, Bld: 214 mg/dL — ABNORMAL HIGH (ref 70–99)
Potassium: 3.4 mmol/L — ABNORMAL LOW (ref 3.5–5.1)
Sodium: 135 mmol/L (ref 135–145)

## 2022-02-06 LAB — BRAIN NATRIURETIC PEPTIDE: B Natriuretic Peptide: 154.4 pg/mL — ABNORMAL HIGH (ref 0.0–100.0)

## 2022-02-06 LAB — RESP PANEL BY RT-PCR (FLU A&B, COVID) ARPGX2
Influenza A by PCR: NEGATIVE
Influenza B by PCR: NEGATIVE
SARS Coronavirus 2 by RT PCR: NEGATIVE

## 2022-02-06 MED ORDER — ENOXAPARIN SODIUM 30 MG/0.3ML IJ SOSY
30.0000 mg | PREFILLED_SYRINGE | INTRAMUSCULAR | Status: DC
  Administered 2022-02-07 – 2022-02-18 (×12): 30 mg via SUBCUTANEOUS
  Filled 2022-02-06 (×12): qty 0.3

## 2022-02-06 MED ORDER — INSULIN ASPART 100 UNIT/ML IJ SOLN
0.0000 [IU] | Freq: Every day | INTRAMUSCULAR | Status: DC
  Administered 2022-02-07: 3 [IU] via SUBCUTANEOUS
  Administered 2022-02-12: 2 [IU] via SUBCUTANEOUS
  Administered 2022-02-17: 5 [IU] via SUBCUTANEOUS
  Filled 2022-02-06: qty 0.05

## 2022-02-06 MED ORDER — MAGNESIUM SULFATE 2 GM/50ML IV SOLN
2.0000 g | Freq: Once | INTRAVENOUS | Status: AC
  Administered 2022-02-06: 2 g via INTRAVENOUS
  Filled 2022-02-06: qty 50

## 2022-02-06 MED ORDER — POTASSIUM CHLORIDE 2 MEQ/ML IV SOLN
INTRAVENOUS | Status: AC
  Filled 2022-02-06 (×3): qty 1000

## 2022-02-06 MED ORDER — LORAZEPAM 2 MG/ML IJ SOLN
0.5000 mg | Freq: Once | INTRAMUSCULAR | Status: AC
  Administered 2022-02-06: 0.5 mg via INTRAVENOUS
  Filled 2022-02-06: qty 1

## 2022-02-06 MED ORDER — ALBUTEROL SULFATE (2.5 MG/3ML) 0.083% IN NEBU
10.0000 mg/h | INHALATION_SOLUTION | Freq: Once | RESPIRATORY_TRACT | Status: AC
  Administered 2022-02-06: 10 mg/h via RESPIRATORY_TRACT
  Filled 2022-02-06: qty 12

## 2022-02-06 MED ORDER — INSULIN ASPART 100 UNIT/ML IJ SOLN
0.0000 [IU] | Freq: Three times a day (TID) | INTRAMUSCULAR | Status: DC
  Administered 2022-02-07: 3 [IU] via SUBCUTANEOUS
  Administered 2022-02-07: 2 [IU] via SUBCUTANEOUS
  Administered 2022-02-07: 3 [IU] via SUBCUTANEOUS
  Administered 2022-02-08: 1 [IU] via SUBCUTANEOUS
  Administered 2022-02-08: 3 [IU] via SUBCUTANEOUS
  Administered 2022-02-09: 1 [IU] via SUBCUTANEOUS
  Administered 2022-02-09: 7 [IU] via SUBCUTANEOUS
  Administered 2022-02-10: 5 [IU] via SUBCUTANEOUS
  Administered 2022-02-10: 2 [IU] via SUBCUTANEOUS
  Administered 2022-02-11 (×2): 1 [IU] via SUBCUTANEOUS
  Administered 2022-02-11: 7 [IU] via SUBCUTANEOUS
  Administered 2022-02-12: 3 [IU] via SUBCUTANEOUS
  Administered 2022-02-12: 1 [IU] via SUBCUTANEOUS
  Administered 2022-02-13: 2 [IU] via SUBCUTANEOUS
  Administered 2022-02-13 – 2022-02-14 (×2): 5 [IU] via SUBCUTANEOUS
  Administered 2022-02-14: 2 [IU] via SUBCUTANEOUS
  Administered 2022-02-15: 7 [IU] via SUBCUTANEOUS
  Administered 2022-02-15: 1 [IU] via SUBCUTANEOUS
  Administered 2022-02-16 – 2022-02-17 (×2): 5 [IU] via SUBCUTANEOUS
  Administered 2022-02-18: 2 [IU] via SUBCUTANEOUS
  Filled 2022-02-06: qty 0.09

## 2022-02-06 MED ORDER — METHYLPREDNISOLONE SODIUM SUCC 125 MG IJ SOLR: 125.0000 mg | Freq: Once | INTRAMUSCULAR | Status: DC

## 2022-02-06 NOTE — ED Notes (Signed)
RN and MD aware of B/P

## 2022-02-06 NOTE — ED Triage Notes (Signed)
Pt BIB [facility] from home for cough, wheezing, increased trouble breathing. Pt has a hx of COPD. Pt received Duoneb, Solu Medrol, and Tylenol with EMS

## 2022-02-06 NOTE — H&P (Incomplete)
History and Physical  Katie Alvarado:[id number] DOB: February 11, 1926 DOA: 02/06/2022  Referring physician: Dr. Langston Masker, [place]  PCP: [name], MD  Outpatient Specialists: Pulmonary Patient coming from: Home  Chief Complaint: Shortness of breath   HPI: Katie Alvarado is a 88 y.o. female with medical history significant for COPD, bronchiectasis, history of Mycobacterium Avium-intracellulare complex(from positive sputum culture on 06/09/2012), type 2 diabetes, hypertension, GERD, HFpEF, history of multifocal mucous plugging at the lung bases (08/30/21), who presented to [facility] ED due to worsening shortness of breath.  Associated with cough and wheezing.  Onset of symptoms about a week ago, acutely worsened today.  EMS was activated.  Upon EMS arrival she received DuoNebs treatment, IV Solu-Medrol, in route.  In the ED, COVID-19 screening test negative.  No evidence of pneumonia on chest x-ray.  Initially placed on BiPAP, with improvement after treatment with, nebulizer, IV magnesium, and IV steroids via EMS.  She was taken off the BiPAP and placed on 3 L [place] with O2 saturation in the high 90s.  EDP requested admission for further management.  The patient was admitted by [facility], hospitalist service.  ED Course: Tmax 99.1.  BP 111/53, pulse 59, respiratory rate 15, O2 saturation 97% on 3 L.  Lab studies remarkable for serum potassium 3.4, glucose 214, BUN 32, creatinine 1.10.  GFR 47.  Baseline creatinine 0.8 with GFR greater than 60.  BNP 154.  Hemoglobin 10.7 at baseline.  WBC 7.3.  Platelet count 220.   Review of Systems: Review of systems as noted in the HPI. All other systems reviewed and are negative.   Past Medical History:  Diagnosis Date   Acute nasopharyngitis (common cold)    Arthritis    Bronchiectasis with acute exacerbation (HCC)    Cancer (HCC)    skin cancer   COPD (chronic obstructive pulmonary disease) ([place])    sees dr. Annamaria Boots    Cough    Diabetes mellitus    fasting 130-150    Dizziness    Esophageal reflux    History of radiation therapy 10/24/17- 12/05/17   Lower lip, 4.4 Gy X 10 fractions given twice weekly for a total dose of 44 Gy.   Hypertension    Insomnia, unspecified    Other symptoms involving nervous and musculoskeletal systems(781.99)    Pneumonia    hx of   Shortness of breath    exertion   Past Surgical History:  Procedure Laterality Date   ABDOMINAL HYSTERECTOMY     ANKLE FRACTURE SURGERY Left    APPENDECTOMY     BASAL CELL CARCINOMA EXCISION     NOSE   CATARACT EXTRACTION, BILATERAL     OUT PATIENT   COLON SURGERY     colon resection for diverticulitis   IR EPIDUROGRAPHY  05/07/2018   LUMBAR LAMINECTOMY/DECOMPRESSION MICRODISCECTOMY N/A 02/15/2013   Procedure: LUMBAR LAMINECTOMY/DECOMPRESSION MICRODISCECTOMY LUMBAR FOUR-FIVE;  Surgeon: Otilio Connors, MD;  Location: [place] [facility];  Service: Neurosurgery;  Laterality: N/A;   VARICOSE VEIN SURGERY      Social History:  reports that she has never smoked. She has never used smokeless tobacco. She reports current alcohol use. She reports that she does not use drugs.   Allergies  Allergen Reactions   Welchol [Colesevelam Hcl] Hives   Amlodipine     Ankle swelling   Atorvastatin     Other reaction(s): foot pain   Coreg [Carvedilol]     dizzy   Doxycycline     rash  Glucosamine Itching and Other (See Comments)    "Breakouts"   Hydrocodone-Acetaminophen     Other reaction(s): did not feel right   Mobic [Meloxicam] Other (See Comments)    Unknown reaction   Sulfa Antibiotics     itching   Telmisartan     itching   Tramadol Nausea Only    *can tolerate 1/2 dose" per husband at bedside   Celecoxib Hives, Itching and Rash   Statins Rash    Family History  Problem Relation Age of Onset   Chronic bronchitis Father    Other Sister        heart trouble   Other Brother        heart trouble      Prior to Admission medications   Medication Sig Start Date End Date Taking?  Authorizing Provider  acetaminophen (TYLENOL) 325 MG tablet Take 325 mg by mouth every 6 (six) hours as needed for mild pain.   Yes [provider]  albuterol (VENTOLIN HFA) 108 (90 Base) MCG/ACT inhaler INHALE 2 PUFFS INTO THE LUNGS EVERY 6 HOURS AS NEEDED FOR WHEEZING OR SHORTNESS OF BREATH Patient taking differently: Inhale 2 puffs into the lungs every 6 (six) hours as needed for wheezing or shortness of breath. 06/18/21  Yes Young, Tarri Fuller D, MD  ALPRAZolam Duanne Moron) 0.5 MG tablet Take 0.5 mg by mouth 2 (two) times daily as needed. 01/17/22  Yes [provider]  aspirin EC 81 MG tablet Take 81 mg by mouth daily. Swallow whole.   Yes [provider]  budesonide (PULMICORT) 0.25 MG/2ML nebulizer solution USE 1 VIAL  IN  NEBULIZER TWICE  DAILY - rinse mouth after treatment Patient taking differently: Take 0.25 mg by nebulization daily. 06/30/20  Yes Young, Tarri Fuller D, MD  carboxymethylcellulose (REFRESH PLUS) 0.5 % SOLN Place 1 drop into both eyes as needed (dry eyes).   Yes [provider]  cholecalciferol (VITAMIN D) 1000 UNITS tablet Take 1,000 Units by mouth daily.   Yes [provider]  esomeprazole (NEXIUM) 40 MG capsule Take 40 mg by mouth daily.   Yes [provider]  fluticasone (FLONASE) 50 MCG/ACT nasal spray Place 1 spray into both nostrils daily. 02/05/22  Yes [provider]  glipiZIDE (GLUCOTROL) 5 MG tablet Take 5 mg by mouth every morning. 02/14/21  Yes [provider]  hydrALAZINE (APRESOLINE) 50 MG tablet Take 50 mg by mouth 3 (three) times daily. 03/12/21  Yes [provider]  ipratropium (ATROVENT) 0.02 % nebulizer solution USE 1 VIAL IN NEBULIZER 4 TIMES DAILY 07/05/21  Yes Young, Clinton D, MD  irbesartan (AVAPRO) 300 MG tablet TAKE 1 TABLET BY MOUTH EVERY DAY Patient taking differently: Take 300 mg by mouth daily. 10/04/21  Yes Jerline Pain, MD  metoprolol tartrate (LOPRESSOR) 50 MG tablet Take 1  tablet (50 mg total) by mouth 2 (two) times daily. 09/01/21  Yes Mercy Riding, MD  Multiple Vitamins-Minerals (PRESERVISION AREDS 2 PO) Take 1 tablet by mouth in the morning and at bedtime.   Yes [provider]  torsemide (DEMADEX) 20 MG tablet Take 20 mg by mouth daily. 12/29/21  Yes [provider]  traMADol (ULTRAM) 50 MG tablet Take 50 mg by mouth 2 (two) times daily as needed. 01/30/22  Yes [provider]  vitamin E 400 UNIT capsule Take 400 Units by mouth 2 (two) times a week.   Yes [provider]  azithromycin (ZITHROMAX) 250 MG tablet Take 1 tablet (250 mg  total) by mouth daily. Patient not taking: Reported on 02/06/2022 09/02/21   Mercy Riding, MD  furosemide (LASIX) 20 MG tablet Take 1 tablet (20 mg total) by mouth daily. Patient not taking: Reported on 02/06/2022 09/04/21   Mercy Riding, MD  Respiratory Therapy Supplies (FLUTTER) DEVI Use as directed 11/03/15   Deneise Lever, MD    Physical Exam: BP (!) 125/54   Pulse 73   Temp 99.1 F (37.3 C) (Oral)   Resp 18   SpO2 94%   General: 88 y.o. year-old female well developed well nourished in no acute distress.  Somnolent but arouses to stimulus. Cardiovascular: Regular rate and rhythm with no rubs or gallops.  No thyromegaly or JVD noted.  No lower extremity edema. 2/4 pulses in all 4 extremities. Respiratory: Mild rales at bases.  No wheezing noted.  Poor inspiratory effort. Abdomen: Soft nontender nondistended with normal bowel sounds x4 quadrants. Muskuloskeletal: No cyanosis, clubbing or edema noted bilaterally Neuro: CN II-XII intact, strength, sensation, reflexes Skin: No ulcerative lesions noted or rashes Psychiatry: Unable to assess mood or judgment due to somnolence.           Labs on Admission:  Basic Metabolic Panel: Recent Labs  Lab 02/06/22 1935  NA 135  K 3.4*  CL 94*  CO2 29  GLUCOSE 214*  BUN 32*  CREATININE 1.10*  CALCIUM 8.7*   Liver Function Tests: No  results for input(s): "AST", "ALT", "ALKPHOS", "BILITOT", "PROT", "ALBUMIN" in the last 168 hours. No results for input(s): "LIPASE", "AMYLASE" in the last 168 hours. No results for input(s): "AMMONIA" in the last 168 hours. CBC: Recent Labs  Lab 02/06/22 1935  WBC 7.3  NEUTROABS 5.1  HGB 10.7*  HCT 32.8*  MCV 88.2  PLT 220   Cardiac Enzymes: No results for input(s): "CKTOTAL", "CKMB", "CKMBINDEX", "TROPONINI" in the last 168 hours.  BNP (last 3 results) Recent Labs    08/29/21 2150 02/06/22 1935  BNP 126.0* 154.4*    ProBNP (last 3 results) No results for input(s): "PROBNP" in the last 8760 hours.  CBG: No results for input(s): "GLUCAP" in the last 168 hours.  Radiological Exams on Admission: DG Chest Port 1 View  Result Date: 02/06/2022 CLINICAL DATA:  Shortness of breath. EXAM: PORTABLE CHEST 1 VIEW COMPARISON:  Radiograph 08/30/2021. CT 01/02/2021 FINDINGS: Stable heart size and mediastinal contours. Aortic atherosclerosis. Chronic left lung volume loss. Chronic right upper lobe and right mid lung opacity corresponds to bronchiectasis and mucoid impaction on prior CT. The chronic left basilar opacities are better appreciated on prior CT with only limited radiographic correlate. No acute airspace disease. No pleural effusion. Chronic left shoulder arthropathy. IMPRESSION: 1. No acute findings. 2. Chronic right upper lobe and right mid lung opacity corresponds to bronchiectasis and mucoid impaction on prior CT. Electronically Signed   By: Keith Rake M.D.   On: 02/06/2022 20:02    EKG: I independently viewed the EKG done and my findings are as followed: None available at the time of this visit.  Assessment/Plan Present on Admission:  COPD with acute exacerbation (HCC)  Principal Problem:   COPD with acute exacerbation ([place])  Acute COPD exacerbation with possible mild to moderate bronchiectasis exacerbation, unclear trigger Shortness of breath and a cough x1 week  worsened acutely on the day of presentation with new oxygen requirement. History of bronchiectasis, most recent sputum culture showing rare normal respiratory flora no Staph aureus or Pseudomonas seen, collected on 01/02/2021. Obtain sputum culture,  start Unasyn empirically Pulmonary toilet, bronchodilators  Acute hypoxic respiratory failure secondary to above Not on oxygen supplementation at baseline Currently requiring 3 L O2 [place] to maintain O2 saturation greater than 92% Wean off oxygen supplementation as tolerated Home O2 evaluation on 02/07/2022 AM.  Chronic anxiety/depression Resume home regimen.  Type 2 diabetes with hyperglycemia Obtain hemoglobin A1c Start insulin sliding scale.  Hypokalemia Serum potassium 3.4 Repleted intravenously. Continue LR KCl 40 mEq at 50 cc/h x 1 day.  GERD Resume home regimen.  Physical debility PT OT assessment in the morning Fall precautions.    DVT prophylaxis: Subcu Lovenox daily  Code Status: Full code.  Family Communication: Significant other at bedside.  Disposition Plan: Admitted to telemetry unit as observation status.  Consults called: None.  Admission status: Observation status.   Status is: Observation    Kayleen Memos MD [facility] Pager [phone]  If 7PM-7AM, please contact night-coverage [email or web address] Password [email or web address]  02/06/2022, 11:19 PM

## 2022-02-06 NOTE — ED Provider Notes (Signed)
[facility] DEPT Provider Note   CSN: [id number] Arrival date & time: 02/06/22  1845     History  Chief Complaint  Patient presents with   Cough    Katie Alvarado is a 88 y.o. female with history of COPD, bronchiectasis, diastolic heart failure, diabetes, presenting to the ED with wheezing and shortness of breath.  The patient was recently hospitalized in May of this year for respiratory failure, and has experienced mucous plugging in the past as well.  She returns today reporting shortness of breath, show ongoing for a week but abruptly worsened today.  She reports coughing.  .  She is here with her caretaker, Fritz Pickerel.  EMS gave 125 mg IV solumedrol  HPI     Home Medications Prior to Admission medications   Medication Sig Start Date End Date Taking? Authorizing Provider  acetaminophen (TYLENOL) 325 MG tablet Take 325 mg by mouth every 6 (six) hours as needed for mild pain.    [provider]  albuterol (VENTOLIN HFA) 108 (90 Base) MCG/ACT inhaler INHALE 2 PUFFS INTO THE LUNGS EVERY 6 HOURS AS NEEDED FOR WHEEZING OR SHORTNESS OF BREATH Patient taking differently: Inhale 2 puffs into the lungs every 6 (six) hours as needed for wheezing or shortness of breath. 06/18/21   Deneise Lever, MD  ALPRAZolam Duanne Moron) 0.25 MG tablet Take 0.25 mg by mouth 3 (three) times daily as needed for anxiety or sleep.    [provider]  ALPRAZolam Duanne Moron) 0.5 MG tablet Take 0.5 mg by mouth 2 (two) times daily as needed. 01/17/22   [provider]  aspirin EC 81 MG tablet Take 81 mg by mouth daily. Swallow whole.    [provider]  azithromycin (ZITHROMAX) 250 MG tablet Take 1 tablet (250 mg total) by mouth daily. 09/02/21   Mercy Riding, MD  budesonide (PULMICORT) 0.25 MG/2ML nebulizer solution USE 1 VIAL  IN  NEBULIZER TWICE  DAILY - rinse mouth after treatment Patient taking differently: Take 0.25 mg by nebulization daily. 06/30/20    Baird Lyons D, MD  carboxymethylcellulose (REFRESH PLUS) 0.5 % SOLN Place 1 drop into both eyes as needed (dry eyes).    [provider]  cholecalciferol (VITAMIN D) 1000 UNITS tablet Take 1,000 Units by mouth daily.    [provider]  Cinnamon 500 MG capsule Take 1,000 mg by mouth 2 (two) times daily as needed (for sugar levels).    [provider]  esomeprazole (NEXIUM) 40 MG capsule Take 40 mg by mouth daily.    [provider]  fluocinolone (SYNALAR) 0.01 % external solution Apply 1 application. topically 2 (two) times daily as needed for irritation. 04/13/21   [provider]  fluticasone (FLONASE) 50 MCG/ACT nasal spray Place 1 spray into both nostrils daily. 02/05/22   [provider]  furosemide (LASIX) 20 MG tablet Take 1 tablet (20 mg total) by mouth daily. 09/04/21   Mercy Riding, MD  glipiZIDE (GLUCOTROL) 5 MG tablet Take 5 mg by mouth every morning. 02/14/21   [provider]  hydrALAZINE (APRESOLINE) 50 MG tablet Take 50 mg by mouth 3 (three) times daily. 03/12/21   [provider]  ipratropium (ATROVENT) 0.02 % nebulizer solution USE 1 VIAL IN NEBULIZER 4 TIMES DAILY 07/05/21   Baird Lyons D, MD  irbesartan (AVAPRO) 300 MG tablet TAKE 1 TABLET BY MOUTH EVERY DAY 10/04/21   Jerline Pain, MD  ketoconazole (NIZORAL) 2 % shampoo Apply  1 application topically See admin instructions. Apply to scalp 2 to 3 times per week. 09/25/20   [provider]  meclizine (ANTIVERT) 12.5 MG tablet Take 12.5 mg by mouth 3 (three) times daily as needed for dizziness.    [provider]  metoprolol tartrate (LOPRESSOR) 50 MG tablet Take 1 tablet (50 mg total) by mouth 2 (two) times daily. 09/01/21   Mercy Riding, MD  Multiple Vitamins-Minerals (PRESERVISION AREDS 2 PO) Take 1 tablet by mouth in the morning and at bedtime.    [provider]  ondansetron (ZOFRAN) 4 MG tablet Take 4 mg by mouth every 6 (six)  hours as needed for nausea or vomiting. 10/11/20   [provider]  Respiratory Therapy Supplies (FLUTTER) DEVI Use as directed 11/03/15   Baird Lyons D, MD  torsemide (DEMADEX) 20 MG tablet Take 20 mg by mouth daily. 12/29/21   [provider]  traMADol (ULTRAM) 50 MG tablet Take 50 mg by mouth 2 (two) times daily as needed. 01/30/22   [provider]  vitamin E 400 UNIT capsule Take 400 Units by mouth 2 (two) times a week.    [provider]      Allergies    Welchol [colesevelam hcl], Amlodipine, Atorvastatin, Coreg [carvedilol], Doxycycline, Glucosamine, Hydrocodone-acetaminophen, Mobic [meloxicam], Sulfa antibiotics, Telmisartan, Tramadol, Celecoxib, and Statins    Review of Systems   Review of Systems  Physical Exam Updated Vital Signs BP (!) 125/54   Pulse 73   Temp 99.1 F (37.3 C) (Oral)   Resp 18   SpO2 94%  Physical Exam Constitutional:      General: She is not in acute distress. HENT:     Head: Normocephalic and atraumatic.  Eyes:     Conjunctiva/sclera: Conjunctivae normal.     Pupils: Pupils are equal, round, and reactive to light.  Cardiovascular:     Rate and Rhythm: Normal rate and regular rhythm.  Pulmonary:     Comments: Speaking in short sentences, retracting, no audible wheezing 95% on 3L [place] Abdominal:     General: There is no distension.     Tenderness: There is no abdominal tenderness.  Skin:    General: Skin is warm and dry.  Neurological:     General: No focal deficit present.     Mental Status: She is alert. Mental status is at baseline.  Psychiatric:        Mood and Affect: Mood normal.        Behavior: Behavior normal.     ED Results / Procedures / Treatments   Labs (all labs ordered are listed, but only abnormal results are displayed) Labs Reviewed  BASIC METABOLIC PANEL - Abnormal; Notable for the following components:      Result Value   Potassium 3.4 (*)    Chloride 94 (*)    Glucose, Bld 214  (*)    BUN 32 (*)    Creatinine, Ser 1.10 (*)    Calcium 8.7 (*)    GFR, Estimated 47 (*)    All other components within normal limits  CBC WITH DIFFERENTIAL/PLATELET - Abnormal; Notable for the following components:   RBC 3.72 (*)    Hemoglobin 10.7 (*)    HCT 32.8 (*)    All other components within normal limits  BLOOD GAS, VENOUS - Abnormal; Notable for the following components:   pH, Ven 7.44 (*)    pO2, Ven <31 (*)    Bicarbonate 33.3 (*)    Acid-Base  Excess 8.0 (*)    All other components within normal limits  BRAIN NATRIURETIC PEPTIDE - Abnormal; Notable for the following components:   B Natriuretic Peptide 154.4 (*)    All other components within normal limits  RESP PANEL BY RT-PCR (FLU A&B, COVID) [id number]    EKG None  Radiology DG Chest Port 1 View  Result Date: 02/06/2022 CLINICAL DATA:  Shortness of breath. EXAM: PORTABLE CHEST 1 VIEW COMPARISON:  Radiograph 08/30/2021. CT 01/02/2021 FINDINGS: Stable heart size and mediastinal contours. Aortic atherosclerosis. Chronic left lung volume loss. Chronic right upper lobe and right mid lung opacity corresponds to bronchiectasis and mucoid impaction on prior CT. The chronic left basilar opacities are better appreciated on prior CT with only limited radiographic correlate. No acute airspace disease. No pleural effusion. Chronic left shoulder arthropathy. IMPRESSION: 1. No acute findings. 2. Chronic right upper lobe and right mid lung opacity corresponds to bronchiectasis and mucoid impaction on prior CT. Electronically Signed   By: Keith Rake M.D.   On: 02/06/2022 20:02    Procedures .Critical Care  Performed by: Wyvonnia Dusky, MD Authorized by: Wyvonnia Dusky, MD   Critical care provider statement:    Critical care time (minutes):  40   Critical care time was exclusive of:  Separately billable procedures and treating other patients   Critical care was necessary to treat or prevent imminent or life-threatening  deterioration of the following conditions:  Respiratory failure   Critical care was time spent personally by me on the following activities:  Ordering and performing treatments and interventions, ordering and review of laboratory studies, ordering and review of radiographic studies, pulse oximetry, review of old charts, examination of patient and evaluation of patient's response to treatment   Care discussed with: admitting provider   Comments:     Bipap for copd exacerbation     Medications Ordered in ED Medications  albuterol (PROVENTIL) (2.5 MG/3ML) 0.083% nebulizer solution (10 mg/hr Nebulization Given 02/06/22 1940)  LORazepam (ATIVAN) injection 0.5 mg (0.5 mg Intravenous Given 02/06/22 1944)  magnesium sulfate IVPB 2 g 50 mL (0 g Intravenous Stopped 02/06/22 2105)    ED Course/ Medical Decision Making/ A&P Clinical Course as of 02/06/22 2245  Wed Feb 06, 2022  2127 We will trial the patient off of BiPAP - she is sleeping but breathing comfortably [MT]  2215 Patient has been off of BiPAP and maintaining her saturations well on 3 L nasal cannula, which is a new oxygen requirement for her, she does not wear any at baseline.  Her lungs sound good at this time.  She is stable for admission. [MT]  2242 Admitted to Dr Nevada Crane [MT]    Clinical Course User Index [MT] Langston Masker Carola Rhine, MD                           Medical Decision Making Amount and/or Complexity of Data Reviewed Labs: ordered. Radiology: ordered.  Risk Prescription drug management. Decision regarding hospitalization.   This patient presents to the ED with concern for shortness of breath. This involves an extensive number of treatment options, and is a complaint that carries with it a high risk of complications and morbidity.  The differential diagnosis includes COPD vs CHF vs PNA vs anemia vs other  Co-morbidities that complicate the patient evaluation: hx of copd, chf  Additional history obtained from  EMS  External records from outside source obtained and reviewed including last hospitalization discharge  summary  I ordered and personally interpreted labs.  The pertinent results include: COVID-negative, no acidosis or significant carbon oxide retention on venous gas.  BMP near baseline.  CBC also near baseline.  I ordered imaging studies including dg chest I independently visualized and interpreted imaging which showed no focal infiltrate, chronic mucous plugging of the right upper lobe I agree with the radiologist interpretation  The patient was maintained on a cardiac monitor.  I personally viewed and interpreted the cardiac monitored which showed an underlying rhythm of: Sinus rhythm    I ordered medication including bipap, nebulizers, magnesium (steroids given by EMS)  I have reviewed the patients home medicines and have made adjustments as needed  Test Considered: doubt acute PE clinically  After the interventions noted above, I reevaluated the patient and found that they have: improved  Patient is DNR/DNI.  Dispostion:  After consideration of the diagnostic results and the patients response to treatment, I feel that the patent would benefit from medical admission.         Final Clinical Impression(s) / ED Diagnoses Final diagnoses:  COPD exacerbation [facility])    Rx / DC Orders ED Discharge Orders     None         Kairav Russomanno, Carola Rhine, MD 02/06/22 2245

## 2022-02-06 NOTE — ED Notes (Signed)
Let RN know of the low B/P

## 2022-02-06 NOTE — Progress Notes (Signed)
Pt appears more comfortable, no increased wob noted.  Bipap mask removed per MD request and placed on 3l [place].  HR77, rr20, spo2 96%.  Bipap remains in room on standby.

## 2022-02-07 DIAGNOSIS — Z743 Need for continuous supervision: Secondary | ICD-10-CM | POA: Diagnosis not present

## 2022-02-07 DIAGNOSIS — F419 Anxiety disorder, unspecified: Secondary | ICD-10-CM | POA: Diagnosis present

## 2022-02-07 DIAGNOSIS — R2681 Unsteadiness on feet: Secondary | ICD-10-CM | POA: Diagnosis not present

## 2022-02-07 DIAGNOSIS — R531 Weakness: Secondary | ICD-10-CM | POA: Diagnosis not present

## 2022-02-07 DIAGNOSIS — K219 Gastro-esophageal reflux disease without esophagitis: Secondary | ICD-10-CM | POA: Diagnosis present

## 2022-02-07 DIAGNOSIS — Z23 Encounter for immunization: Secondary | ICD-10-CM | POA: Diagnosis present

## 2022-02-07 DIAGNOSIS — E1165 Type 2 diabetes mellitus with hyperglycemia: Secondary | ICD-10-CM | POA: Diagnosis present

## 2022-02-07 DIAGNOSIS — Z1152 Encounter for screening for COVID-19: Secondary | ICD-10-CM | POA: Diagnosis not present

## 2022-02-07 DIAGNOSIS — E1122 Type 2 diabetes mellitus with diabetic chronic kidney disease: Secondary | ICD-10-CM | POA: Diagnosis present

## 2022-02-07 DIAGNOSIS — J9601 Acute respiratory failure with hypoxia: Secondary | ICD-10-CM | POA: Diagnosis present

## 2022-02-07 DIAGNOSIS — Z923 Personal history of irradiation: Secondary | ICD-10-CM | POA: Diagnosis not present

## 2022-02-07 DIAGNOSIS — Z881 Allergy status to other antibiotic agents status: Secondary | ICD-10-CM | POA: Diagnosis not present

## 2022-02-07 DIAGNOSIS — F411 Generalized anxiety disorder: Secondary | ICD-10-CM | POA: Diagnosis not present

## 2022-02-07 DIAGNOSIS — J471 Bronchiectasis with (acute) exacerbation: Secondary | ICD-10-CM | POA: Diagnosis present

## 2022-02-07 DIAGNOSIS — M189 Osteoarthritis of first carpometacarpal joint, unspecified: Secondary | ICD-10-CM | POA: Diagnosis not present

## 2022-02-07 DIAGNOSIS — Z885 Allergy status to narcotic agent status: Secondary | ICD-10-CM | POA: Diagnosis not present

## 2022-02-07 DIAGNOSIS — R1312 Dysphagia, oropharyngeal phase: Secondary | ICD-10-CM | POA: Diagnosis not present

## 2022-02-07 DIAGNOSIS — Z85828 Personal history of other malignant neoplasm of skin: Secondary | ICD-10-CM | POA: Diagnosis not present

## 2022-02-07 DIAGNOSIS — I110 Hypertensive heart disease with heart failure: Secondary | ICD-10-CM | POA: Diagnosis not present

## 2022-02-07 DIAGNOSIS — I130 Hypertensive heart and chronic kidney disease with heart failure and stage 1 through stage 4 chronic kidney disease, or unspecified chronic kidney disease: Secondary | ICD-10-CM | POA: Diagnosis present

## 2022-02-07 DIAGNOSIS — D631 Anemia in chronic kidney disease: Secondary | ICD-10-CM | POA: Diagnosis present

## 2022-02-07 DIAGNOSIS — M6281 Muscle weakness (generalized): Secondary | ICD-10-CM | POA: Diagnosis not present

## 2022-02-07 DIAGNOSIS — R41841 Cognitive communication deficit: Secondary | ICD-10-CM | POA: Diagnosis not present

## 2022-02-07 DIAGNOSIS — N1831 Chronic kidney disease, stage 3a: Secondary | ICD-10-CM | POA: Diagnosis present

## 2022-02-07 DIAGNOSIS — Z882 Allergy status to sulfonamides status: Secondary | ICD-10-CM | POA: Diagnosis not present

## 2022-02-07 DIAGNOSIS — Z66 Do not resuscitate: Secondary | ICD-10-CM | POA: Diagnosis present

## 2022-02-07 DIAGNOSIS — Z741 Need for assistance with personal care: Secondary | ICD-10-CM | POA: Diagnosis not present

## 2022-02-07 DIAGNOSIS — Z7409 Other reduced mobility: Secondary | ICD-10-CM | POA: Diagnosis not present

## 2022-02-07 DIAGNOSIS — I5032 Chronic diastolic (congestive) heart failure: Secondary | ICD-10-CM | POA: Diagnosis present

## 2022-02-07 DIAGNOSIS — J310 Chronic rhinitis: Secondary | ICD-10-CM | POA: Diagnosis not present

## 2022-02-07 DIAGNOSIS — E876 Hypokalemia: Secondary | ICD-10-CM | POA: Diagnosis present

## 2022-02-07 DIAGNOSIS — H548 Legal blindness, as defined in USA: Secondary | ICD-10-CM | POA: Diagnosis not present

## 2022-02-07 DIAGNOSIS — F32A Depression, unspecified: Secondary | ICD-10-CM | POA: Diagnosis present

## 2022-02-07 DIAGNOSIS — M19041 Primary osteoarthritis, right hand: Secondary | ICD-10-CM | POA: Diagnosis present

## 2022-02-07 DIAGNOSIS — Z9049 Acquired absence of other specified parts of digestive tract: Secondary | ICD-10-CM | POA: Diagnosis not present

## 2022-02-07 DIAGNOSIS — Z888 Allergy status to other drugs, medicaments and biological substances status: Secondary | ICD-10-CM | POA: Diagnosis not present

## 2022-02-07 DIAGNOSIS — J441 Chronic obstructive pulmonary disease with (acute) exacerbation: Secondary | ICD-10-CM | POA: Diagnosis present

## 2022-02-07 DIAGNOSIS — M810 Age-related osteoporosis without current pathological fracture: Secondary | ICD-10-CM | POA: Diagnosis not present

## 2022-02-07 DIAGNOSIS — E559 Vitamin D deficiency, unspecified: Secondary | ICD-10-CM | POA: Diagnosis not present

## 2022-02-07 LAB — HEMOGLOBIN A1C
Hgb A1c MFr Bld: 6.8 % — ABNORMAL HIGH (ref 4.8–5.6)
Mean Plasma Glucose: 148.46 mg/dL

## 2022-02-07 LAB — RENAL FUNCTION PANEL
Albumin: 3.4 g/dL — ABNORMAL LOW (ref 3.5–5.0)
Anion gap: 13 (ref 5–15)
BUN: 36 mg/dL — ABNORMAL HIGH (ref 8–23)
CO2: 27 mmol/L (ref 22–32)
Calcium: 8.9 mg/dL (ref 8.9–10.3)
Chloride: 95 mmol/L — ABNORMAL LOW (ref 98–111)
Creatinine, Ser: 1.2 mg/dL — ABNORMAL HIGH (ref 0.44–1.00)
GFR, Estimated: 42 mL/min — ABNORMAL LOW (ref >60)
Glucose, Bld: 238 mg/dL — ABNORMAL HIGH (ref 70–99)
Phosphorus: 4.3 mg/dL (ref 2.5–4.6)
Potassium: 3.7 mmol/L (ref 3.5–5.1)
Sodium: 135 mmol/L (ref 135–145)

## 2022-02-07 LAB — CBC
HCT: 32 % — ABNORMAL LOW (ref 36.0–46.0)
Hemoglobin: 10.3 g/dL — ABNORMAL LOW (ref 12.0–15.0)
MCH: 28.5 pg (ref 26.0–34.0)
MCHC: 32.2 g/dL (ref 30.0–36.0)
MCV: 88.4 fL (ref 80.0–100.0)
Platelets: 213 10*3/uL (ref 150–400)
RBC: 3.62 MIL/uL — ABNORMAL LOW (ref 3.87–5.11)
RDW: 14 % (ref 11.5–15.5)
WBC: 4.6 10*3/uL (ref 4.0–10.5)
nRBC: 0 % (ref 0.0–0.2)

## 2022-02-07 LAB — MAGNESIUM: Magnesium: 2.3 mg/dL (ref 1.7–2.4)

## 2022-02-07 LAB — GLUCOSE, CAPILLARY
Glucose-Capillary: 171 mg/dL — ABNORMAL HIGH (ref 70–99)
Glucose-Capillary: 156 mg/dL — ABNORMAL HIGH (ref 70–99)

## 2022-02-07 LAB — CBG MONITORING, ED
Glucose-Capillary: 273 mg/dL — ABNORMAL HIGH (ref 70–99)
Glucose-Capillary: 234 mg/dL — ABNORMAL HIGH (ref 70–99)
Glucose-Capillary: 216 mg/dL — ABNORMAL HIGH (ref 70–99)

## 2022-02-07 LAB — PROCALCITONIN: Procalcitonin: <0.1 ng/mL

## 2022-02-07 MED ORDER — BUDESONIDE 0.25 MG/2ML IN SUSP
0.2500 mg | Freq: Two times a day (BID) | RESPIRATORY_TRACT | Status: DC
  Administered 2022-02-07 – 2022-02-18 (×22): 0.25 mg via RESPIRATORY_TRACT
  Filled 2022-02-07 (×23): qty 2

## 2022-02-07 MED ORDER — GLIPIZIDE 10 MG PO TABS
5.0000 mg | ORAL_TABLET | Freq: Every day | ORAL | Status: DC
  Administered 2022-02-07 – 2022-02-18 (×10): 5 mg via ORAL
  Filled 2022-02-07 (×11): qty 1

## 2022-02-07 MED ORDER — PANTOPRAZOLE SODIUM 40 MG PO TBEC
40.0000 mg | DELAYED_RELEASE_TABLET | Freq: Every day | ORAL | Status: DC
  Administered 2022-02-07 – 2022-02-18 (×12): 40 mg via ORAL
  Filled 2022-02-07 (×12): qty 1

## 2022-02-07 MED ORDER — TRAMADOL HCL 50 MG PO TABS
50.0000 mg | ORAL_TABLET | Freq: Two times a day (BID) | ORAL | Status: DC | PRN
  Administered 2022-02-09 – 2022-02-17 (×7): 50 mg via ORAL
  Filled 2022-02-07 (×7): qty 1

## 2022-02-07 MED ORDER — ALPRAZOLAM 0.5 MG PO TABS
0.5000 mg | ORAL_TABLET | Freq: Two times a day (BID) | ORAL | Status: DC | PRN
  Administered 2022-02-07 – 2022-02-18 (×19): 0.5 mg via ORAL
  Filled 2022-02-07 (×19): qty 1

## 2022-02-07 MED ORDER — VITAMIN E 180 MG (400 UNIT) PO CAPS
400.0000 [IU] | ORAL_CAPSULE | ORAL | Status: DC
  Administered 2022-02-07 – 2022-02-18 (×4): 400 [IU] via ORAL
  Filled 2022-02-07 (×4): qty 1

## 2022-02-07 MED ORDER — METOPROLOL TARTRATE 25 MG PO TABS
25.0000 mg | ORAL_TABLET | Freq: Every day | ORAL | Status: DC
  Administered 2022-02-07 – 2022-02-15 (×9): 25 mg via ORAL
  Filled 2022-02-07 (×10): qty 1

## 2022-02-07 MED ORDER — METOPROLOL TARTRATE 25 MG PO TABS: 50.0000 mg | ORAL_TABLET | Freq: Two times a day (BID) | ORAL | Status: DC

## 2022-02-07 MED ORDER — TORSEMIDE 20 MG PO TABS
20.0000 mg | ORAL_TABLET | Freq: Every day | ORAL | Status: DC
  Administered 2022-02-07 – 2022-02-18 (×12): 20 mg via ORAL
  Filled 2022-02-07 (×12): qty 1

## 2022-02-07 MED ORDER — HYDRALAZINE HCL 50 MG PO TABS
50.0000 mg | ORAL_TABLET | Freq: Three times a day (TID) | ORAL | Status: DC
  Administered 2022-02-07 – 2022-02-18 (×31): 50 mg via ORAL
  Filled 2022-02-07: qty 1
  Filled 2022-02-07: qty 2
  Filled 2022-02-07 (×31): qty 1

## 2022-02-07 MED ORDER — SODIUM CHLORIDE 0.9 % IV SOLN
3.0000 g | Freq: Two times a day (BID) | INTRAVENOUS | Status: DC
  Administered 2022-02-07 – 2022-02-12 (×11): 3 g via INTRAVENOUS
  Filled 2022-02-07 (×12): qty 8

## 2022-02-07 MED ORDER — SODIUM CHLORIDE 3 % IN NEBU
4.0000 mL | INHALATION_SOLUTION | Freq: Two times a day (BID) | RESPIRATORY_TRACT | Status: AC
  Administered 2022-02-07 – 2022-02-09 (×4): 4 mL via RESPIRATORY_TRACT
  Filled 2022-02-07 (×6): qty 4

## 2022-02-07 MED ORDER — INFLUENZA VAC A&B SA ADJ QUAD 0.5 ML IM PRSY
0.5000 mL | PREFILLED_SYRINGE | INTRAMUSCULAR | Status: AC
  Administered 2022-02-08: 0.5 mL via INTRAMUSCULAR
  Filled 2022-02-07: qty 0.5

## 2022-02-07 MED ORDER — FLUTICASONE PROPIONATE 50 MCG/ACT NA SUSP
1.0000 | Freq: Every day | NASAL | Status: DC
  Administered 2022-02-07 – 2022-02-18 (×11): 1 via NASAL
  Filled 2022-02-07 (×2): qty 16

## 2022-02-07 MED ORDER — PREDNISONE 20 MG PO TABS
40.0000 mg | ORAL_TABLET | Freq: Every day | ORAL | Status: DC
  Administered 2022-02-07 – 2022-02-09 (×3): 40 mg via ORAL
  Filled 2022-02-07 (×3): qty 2

## 2022-02-07 MED ORDER — ASPIRIN 81 MG PO TBEC
81.0000 mg | DELAYED_RELEASE_TABLET | Freq: Every day | ORAL | Status: DC
  Administered 2022-02-07 – 2022-02-18 (×12): 81 mg via ORAL
  Filled 2022-02-07 (×12): qty 1

## 2022-02-07 MED ORDER — POLYETHYLENE GLYCOL 3350 17 G PO PACK
17.0000 g | PACK | Freq: Every day | ORAL | Status: DC | PRN
  Administered 2022-02-08 – 2022-02-15 (×3): 17 g via ORAL
  Filled 2022-02-07 (×4): qty 1

## 2022-02-07 MED ORDER — GUAIFENESIN ER 600 MG PO TB12
600.0000 mg | ORAL_TABLET | Freq: Two times a day (BID) | ORAL | Status: AC
  Administered 2022-02-07 – 2022-02-09 (×6): 600 mg via ORAL
  Filled 2022-02-07 (×6): qty 1

## 2022-02-07 MED ORDER — ACETAMINOPHEN 325 MG PO TABS
650.0000 mg | ORAL_TABLET | Freq: Once | ORAL | Status: AC
  Administered 2022-02-07: 650 mg via ORAL
  Filled 2022-02-07: qty 2

## 2022-02-07 MED ORDER — METOPROLOL TARTRATE 50 MG PO TABS
50.0000 mg | ORAL_TABLET | Freq: Two times a day (BID) | ORAL | Status: DC
  Administered 2022-02-07 – 2022-02-18 (×22): 50 mg via ORAL
  Filled 2022-02-07 (×16): qty 1
  Filled 2022-02-07: qty 2
  Filled 2022-02-07 (×6): qty 1

## 2022-02-07 MED ORDER — PROCHLORPERAZINE EDISYLATE 10 MG/2ML IJ SOLN: 5.0000 mg | Freq: Four times a day (QID) | INTRAMUSCULAR | Status: DC | PRN

## 2022-02-07 MED ORDER — IPRATROPIUM-ALBUTEROL 0.5-2.5 (3) MG/3ML IN SOLN
3.0000 mL | Freq: Four times a day (QID) | RESPIRATORY_TRACT | Status: DC
  Administered 2022-02-07 (×4): 3 mL via RESPIRATORY_TRACT
  Filled 2022-02-07 (×4): qty 3

## 2022-02-07 MED ORDER — PROCHLORPERAZINE EDISYLATE 10 MG/2ML IJ SOLN: 10.0000 mg | Freq: Four times a day (QID) | INTRAMUSCULAR | Status: DC | PRN

## 2022-02-07 NOTE — ED Notes (Signed)
Pt care taken, no complaints at this time, husband at bedside.

## 2022-02-07 NOTE — Evaluation (Signed)
Occupational Therapy Evaluation Patient Details Name: Katie Alvarado MRN: [id number] DOB: 1925-09-18 Today's Date: 02/07/2022   History of Present Illness Katie Alvarado is a 88 y.o. female with medical history significant for COPD, bronchiectasis, history of Mycobacterium Avium-intracellulare complex, type 2 diabetes, hypertension, GERD, HFpEF, history of multifocal mucous plugging at the lung bases who presented to [facility] ED due to worsening shortness of breath.  Associated with cough and wheezing.   Clinical Impression      Patient is currently presenting below her baseline level of functioning for self-care management, given deconditioning, compromised endurance, and generalized weakness. She has chronic L shoulder AROM limitations, as well as vision impairment due to macular degeneration. She required min guard assist for supine to sit, max assist for lower body dressing, and mod assist to stand using a RW. She presented with decreased ability to achieve full upright standing when attempted. She will benefit from further OT services to facilitate improved ADL performance and to decrease the risk for further weakness and deconditioning.    Recommendations for follow up therapy are one component of a multi-disciplinary discharge planning process, led by the attending physician.  Recommendations may be updated based on patient status, additional functional criteria and insurance authorization.   Follow Up Recommendations  Skilled nursing-short term rehab (<3 hours/day)    Assistance Recommended at Discharge Frequent or constant Supervision/Assistance  Patient can return home with the following A lot of help with bathing/dressing/bathroom;Direct supervision/assist for medications management;A lot of help with walking and/or transfers;Direct supervision/assist for financial management;Assist for transportation;Assistance with cooking/housework;Help with stairs or ramp for entrance    Functional  Status Assessment  Patient has had a recent decline in their functional status and demonstrates the ability to make significant improvements in function in a reasonable and predictable amount of time.  Equipment Recommendations  None recommended by OT       Precautions / Restrictions Precautions Precautions: Fall Restrictions Weight Bearing Restrictions: No      Mobility Bed Mobility Overal bed mobility: Needs Assistance Bed Mobility: Supine to Sit, Sit to Supine     Supine to sit: Min guard Sit to supine: Min guard        Transfers Overall transfer level: Needs assistance Equipment used: Rolling walker (2 wheels) Transfers: Sit to/from Stand Sit to Stand: Mod assist                  Balance Overall balance assessment: Needs assistance   Sitting balance-Leahy Scale: Good       Standing balance-Leahy Scale: Poor                  ADL either performed or assessed with clinical judgement   ADL   Eating/Feeding: Set up;Sitting   Grooming: Set up;Sitting           Upper Body Dressing : Set up;Supervision/safety;Sitting   Lower Body Dressing: Maximal assistance                  Vision Patient Visual Report: Other (comment) (chronic macular degeneration)              Pertinent Vitals/Pain Pain Assessment Faces Pain Scale: Hurts a little bit Pain Location: headache     Hand Dominance Right   Extremity/Trunk Assessment Upper Extremity Assessment Upper Extremity Assessment: LUE deficits/detail LUE Deficits / Details: Very minimal L shoulder AROM which is chronic, otherwise B UE AROM WFL. B UE grip strength 4-/5   Lower Extremity Assessment Lower Extremity  Assessment: Generalized weakness       Communication Communication Communication: No difficulties   Cognition Arousal/Alertness: Awake/alert Behavior During Therapy: WFL for tasks assessed/performed Overall Cognitive Status: Within Functional Limits for tasks assessed                         Home Living Family/patient expects to be discharged to:: Private residence Living Arrangements: Alone Available Help at Discharge: Personal care attendant Type of Home: House Home Access: [place]: One level     Bathroom Shower/Tub: [place]: Rollator (4 wheels);BSC/3in1;Shower seat - built in          Prior Functioning/Environment Prior Level of Function : Needs assist             Mobility Comments: typically ambulatory with rollator inside the home ADLs Comments: [place] 8A-11A Mon-Wed and Fri. Fritz Pickerel, friend, comes at 2 PM and will spend the night if she feels poorly.  He takes care of dinner and takes her to MD appointments.The patient is normally modified independent with toileting & dressing.        OT Problem List: Decreased strength;Decreased range of motion;Decreased activity tolerance;Impaired balance (sitting and/or standing);Impaired vision/perception;Cardiopulmonary status limiting activity      OT Treatment/Interventions: Self-care/ADL training;Therapeutic activities;Therapeutic exercise;Energy conservation;DME and/or AE instruction;Balance training    OT Goals(Current goals can be found in the care plan section) Acute Rehab OT Goals Patient Stated Goal: to get better and return home OT Goal Formulation: With patient Time For Goal Achievement: 02/21/22 Potential to Achieve Goals: Fair  OT Frequency: Min 2X/week       AM-PAC OT "6 Clicks" Daily Activity     Outcome Measure Help from another person eating meals?: None Help from another person taking care of personal grooming?: A Little Help from another person toileting, which includes using toliet, bedpan, or urinal?: A Lot Help from another person bathing (including washing, rinsing, drying)?: A Lot Help from another person to put on and taking off regular upper body clothing?: A Little Help from another person  to put on and taking off regular lower body clothing?: A Lot 6 Click Score: 16   End of Session Equipment Utilized During Treatment: Gait belt;Rolling walker (2 wheels) Nurse Communication: Mobility status  Activity Tolerance: Other (comment) (Fair overall tolerance) Patient left: in bed;with call bell/phone within reach;with bed alarm set  OT Visit Diagnosis: Unsteadiness on feet (R26.81);Muscle weakness (generalized) (M62.81)                Time: 4765-4650 OT Time Calculation (min): 23 min Charges:  OT General Charges $OT Visit: 1 Visit OT Evaluation $OT Eval Moderate Complexity: 1 Mod    Ellyssa Zagal L Dat Derksen, OTR/L 02/07/2022, 5:27 PM

## 2022-02-07 NOTE — Hospital Course (Addendum)
Katie Alvarado is a 88 y.o. female with medical history significant for COPD, bronchiectasis, history of Mycobacterium Avium-intracellulare complex (from positive sputum culture on 06/09/2012), type 2 diabetes, hypertension, GERD, HFpEF, history of multifocal mucous plugging at the lung bases (08/30/21), who presented to [facility] ED due to worsening shortness of breath.  Associated with cough and wheezing.  Onset of symptoms about a week PTA. EMS was activated.  Upon EMS arrival she received DuoNebs treatment, IV Solu-Medrol, in route.   In the ED, COVID-19 screening test negative.  No evidence of pneumonia on chest x-ray.  Initially placed on BiPAP, with improvement after treatment with, nebulizer, IV magnesium, and IV steroids via EMS.  She was taken off the BiPAP and placed on 3 L [place] with O2 saturation in the high 90s.   ED Course: Tmax 99.1.  BP 111/53, pulse 59, respiratory rate 15, O2 saturation 97% on 3 L.  Lab studies remarkable for serum potassium 3.4, glucose 214, BUN 32, creatinine 1.10.  GFR 47.  Baseline creatinine 0.8 with GFR greater than 60.  BNP 154.Hemoglobin 10.7 at baseline.  WBC 7.3.  Platelet count 220. Managed with systemic steroids,IV antibiotics antitussives supplemental oxygen.

## 2022-02-07 NOTE — Progress Notes (Signed)
Pharmacy Antibiotic Note  Katie Alvarado is a 88 y.o. female admitted on 02/06/2022 with history of COPD, bronchiectasis, diastolic heart failure, diabetes, presenting to the ED with wheezing and shortness of breath.  Pharmacy has been consulted for unasyn dosing  Plan: Unasyn 3gm IV q12h for CrCl ~ 26 mls/min Follow renal function and clinical course     Temp (24hrs), Avg:98.4 F (36.9 C), Min:97.7 F (36.5 C), Max:99.1 F (37.3 C)  Recent Labs  Lab 02/06/22 1935  WBC 7.3  CREATININE 1.10*    CrCl cannot be calculated (Unknown ideal weight.).    Allergies  Allergen Reactions   Welchol [Colesevelam Hcl] Hives   Amlodipine     Ankle swelling   Atorvastatin     Other reaction(s): foot pain   Coreg [Carvedilol]     dizzy   Doxycycline     rash   Glucosamine Itching and Other (See Comments)    "Breakouts"   Hydrocodone-Acetaminophen     Other reaction(s): did not feel right   Mobic [Meloxicam] Other (See Comments)    Unknown reaction   Sulfa Antibiotics     itching   Telmisartan     itching   Tramadol Nausea Only    *can tolerate 1/2 dose" per husband at bedside   Celecoxib Hives, Itching and Rash   Statins Rash     Thank you for allowing pharmacy to be a part of this patient's care.  Dolly Rias RPh 02/07/2022, 2:32 AM

## 2022-02-07 NOTE — Progress Notes (Signed)
PROGRESS NOTE Katie Alvarado  MVE:[id number] DOB: 02-Nov-1925 DOA: 02/06/2022 PCP: [name], MD   Brief Narrative/Hospital Course: 88 y.o. female with medical history significant for COPD, bronchiectasis, history of Mycobacterium Avium-intracellulare complex(from positive sputum culture on 06/09/2012), type 2 diabetes, hypertension, GERD, HFpEF, history of multifocal mucous plugging at the lung bases (08/30/21), who presented to [facility] ED due to worsening shortness of breath.  Associated with cough and wheezing.  Onset of symptoms about a week ago, acutely worsened today.  EMS was activated.  Upon EMS arrival she received DuoNebs treatment, IV Solu-Medrol, in route.   In the ED, COVID-19 screening test negative.  No evidence of pneumonia on chest x-ray.  Initially placed on BiPAP, with improvement after treatment with, nebulizer, IV magnesium, and IV steroids via EMS.  She was taken off the BiPAP and placed on 3 L [place] with O2 saturation in the high 90s.  EDP requested admission for further management.  The patient was admitted by [facility], hospitalist service.   ED Course: Tmax 99.1.  BP 111/53, pulse 59, respiratory rate 15, O2 saturation 97% on 3 L.  Lab studies remarkable for serum potassium 3.4, glucose 214, BUN 32, creatinine 1.10.  GFR 47.  Baseline creatinine 0.8 with GFR greater than 60.  BNP 154.  Hemoglobin 10.7 at baseline.  WBC 7.3.  Platelet count 220.    Subjective: Seen this am On 3l  ,feels better, no chest pain Having shortness of breath wheezing  cough with white mucus since Sunday, having clear runny nose and in throat No leg edema nausea or vomiting   Assessment and Plan: Principal Problem:   COPD with acute exacerbation ([place])   Acute COPD exacerbation Mild to moderate bronchiectasis exacerbation: Acute hypoxic respiratory failure: History of bronchiectasis, most recent sputum culture revealed normal respiratory flora no staph or Pseudomonas on 1/13.  Follow-up sputum  culture continue Unasyn empirically, pulmonary toileting bronchodilators.  Add po steroid and taper.  PT OT ambulation.  Wean oxygen as tolerated.  CheckED procalcitonin reassuring less than 0.1  T2DM with uncontrolled hyperglycemia: Resume home OHA, continue SSI in the setting of steroid use.  A1c stable 6.8 Recent Labs  Lab 02/07/22 0051 02/07/22 0530 02/07/22 0910 02/07/22 1214  GLUCAP 273*  --  234* 216*  HGBA1C  --  6.8*  --   --     Anxiety/depression: Mood stable not on meds Hypokalemia replaced GERD continue PPI. Hypertension: BP fluctuating 120-160s. Continue hydralazine, torsemide and metoprolol.  Last echo from 2021 reviewed EF 60 to 65%, G1 DD Physical debility/deconditioning PT OT eval requested Goals of care: I discussed with regarding her CODE STATUS patient confirmed that she is DNR do not want intubation or CPR.RT at bedside and witnessed the discussion.  DVT prophylaxis: enoxaparin (LOVENOX) injection 30 mg Start: 02/07/22 1000 Code Status:   Code Status: DNR Family Communication: plan of care discussed with patient at bedside. Patient status is: Inpatient due to ongoing acute hypoxic respiratory failure, COPD exacerbation   Level of care: Telemetry  Dispo: The patient is from: home lives alone            Anticipated disposition: TBD  Mobility Assessment (last 72 hours)     Mobility Assessment     Row Name 02/07/22 1133 02/07/22 10:25:35         Does patient have an order for bedrest or is patient medically unstable -- No - Continue assessment      What is the highest level of  mobility based on the progressive mobility assessment? Level 3 (Stands with assist) - Balance while standing  and cannot march in place --                Objective: Vitals last 24 hrs: Vitals:   02/07/22 0900 02/07/22 1000 02/07/22 1010 02/07/22 1011  BP:  (!) 160/71  (!) 160/71  Pulse:  97  88  Resp:  (!) 28  16  Temp:   97.7 F (36.5 C)   TempSrc:   Oral   SpO2:  95%   99%  Weight: 54 kg      Weight change:   Physical Examination: General exam: alert awake, older than stated age HEENT:Oral mucosa moist, Ear/Nose WNL grossly Respiratory system: bilaterally diminished with wheezing while coughing BS, no use of accessory muscle Cardiovascular system: S1 & S2 +, No JVD. Gastrointestinal system: Abdomen soft,NT,ND, BS+ Nervous System:Alert, awake, moving extremities. Extremities: LE edema +,distal peripheral pulses palpable.  Skin: No rashes,no icterus. MSK: Normal muscle bulk,tone, power  Medications reviewed:  Scheduled Meds:  aspirin EC  81 mg Oral Daily   budesonide  0.25 mg Nebulization BID   enoxaparin (LOVENOX) injection  30 mg Subcutaneous Q24H   fluticasone  1 spray Each Nare Daily   glipiZIDE  5 mg Oral QAC breakfast   guaiFENesin  600 mg Oral BID   hydrALAZINE  50 mg Oral TID   insulin aspart  0-5 Units Subcutaneous QHS   insulin aspart  0-9 Units Subcutaneous TID WC   ipratropium-albuterol  3 mL Nebulization Q6H   metoprolol tartrate  25 mg Oral QHS   metoprolol tartrate  50 mg Oral BID   pantoprazole  40 mg Oral Daily   predniSONE  40 mg Oral Q breakfast   sodium chloride HYPERTONIC  4 mL Nebulization BID   torsemide  20 mg Oral Daily   vitamin E  400 Units Oral Once per day on Mon Thu   Continuous Infusions:  ampicillin-sulbactam (UNASYN) IV Stopped (02/07/22 0529)   lactated ringers 1,000 mL with potassium chloride 40 mEq infusion 50 mL/hr at 02/07/22 0050      Diet Order             Diet heart healthy/carb modified Room service appropriate? Yes; Fluid consistency: Thin  Diet effective now                  Intake/Output Summary (Last 24 hours) at 02/07/2022 1253 Last data filed at 02/06/2022 2105 Gross per 24 hour  Intake 46.77 ml  Output --  Net 46.77 ml   Net IO Since Admission: 46.77 mL [02/07/22 1253]  Wt Readings from Last 3 Encounters:  02/07/22 54 kg  09/27/21 54.4 kg  09/01/21 55.6 kg      Unresulted Labs (From admission, onward)     Start     Ordered   02/13/22 0500  Creatinine, serum  (enoxaparin (LOVENOX)    CrCl >/= 30 ml/min)  Weekly,   R     Comments: while on enoxaparin therapy    02/06/22 2258   02/08/22 0500  Procalcitonin  Daily at 5am,   R      02/07/22 0742   02/07/22 0229  Expectorated Sputum Assessment w Gram Stain, Rflx to Resp Cult  Once,   R       Question:  Patient immune status  Answer:  Normal   02/07/22 0228          Data Reviewed: I have  personally reviewed following labs and imaging studies CBC: Recent Labs  Lab 02/06/22 1935 02/07/22 0530  WBC 7.3 4.6  NEUTROABS 5.1  --   HGB 10.7* 10.3*  HCT 32.8* 32.0*  MCV 88.2 88.4  PLT 220 867   Basic Metabolic Panel: Recent Labs  Lab 02/06/22 1935 02/07/22 0530  NA 135 135  K 3.4* 3.7  CL 94* 95*  CO2 29 27  GLUCOSE 214* 238*  BUN 32* 36*  CREATININE 1.10* 1.20*  CALCIUM 8.7* 8.9  MG  --  2.3  PHOS  --  4.3   GFR: Estimated Creatinine Clearance: 24.4 mL/min (A) (by C-G formula based on SCr of 1.2 mg/dL (H)). Liver Function Tests: Recent Labs  Lab 02/07/22 0530  ALBUMIN 3.4*  CBG: Recent Labs  Lab 02/07/22 0051 02/07/22 0910 02/07/22 1214  GLUCAP 273* 234* 216*   Recent Results (from the past 240 hour(s))  Resp Panel by RT-PCR (Flu A&B, Covid) Anterior Nasal Swab     Status: None   Collection Time: 02/06/22  7:35 PM   Specimen: Anterior Nasal Swab  Result Value Ref Range Status   SARS Coronavirus 2 by RT PCR NEGATIVE NEGATIVE Final    Comment: (NOTE) SARS-CoV-2 target nucleic acids are NOT DETECTED.  The SARS-CoV-2 RNA is generally detectable in upper respiratory specimens during the acute phase of infection. The lowest concentration of SARS-CoV-2 viral copies this assay can detect is 138 copies/mL. A negative result does not preclude SARS-Cov-2 infection and should not be used as the sole basis for treatment or other patient management decisions. A negative  result may occur with  improper specimen collection/handling, submission of specimen other than nasopharyngeal swab, presence of viral mutation(s) within the areas targeted by this assay, and inadequate number of viral copies(<138 copies/mL). A negative result must be combined with clinical observations, patient history, and epidemiological information. The expected result is Negative.  Fact Sheet for Patients:  EntrepreneurPulse.com.au  Fact Sheet for Healthcare Providers:  IncredibleEmployment.be  This test is no t yet approved or cleared by the [place] FDA and  has been authorized for detection and/or diagnosis of SARS-CoV-2 by FDA under an Emergency Use Authorization (EUA). This EUA will remain  in effect (meaning this test can be used) for the duration of the COVID-19 declaration under Section 564(b)(1) of the Act, 21 U.S.C.section 360bbb-3(b)(1), unless the authorization is terminated  or revoked sooner.       Influenza A by PCR NEGATIVE NEGATIVE Final   Influenza B by PCR NEGATIVE NEGATIVE Final    Comment: (NOTE) The Xpert Xpress SARS-CoV-2/FLU/RSV plus assay is intended as an aid in the diagnosis of influenza from Nasopharyngeal swab specimens and should not be used as a sole basis for treatment. Nasal washings and aspirates are unacceptable for Xpert Xpress SARS-CoV-2/FLU/RSV testing.  Fact Sheet for Patients: EntrepreneurPulse.com.au  Fact Sheet for Healthcare Providers: IncredibleEmployment.be  This test is not yet approved or cleared by the [place] FDA and has been authorized for detection and/or diagnosis of SARS-CoV-2 by FDA under an Emergency Use Authorization (EUA). This EUA will remain in effect (meaning this test can be used) for the duration of the COVID-19 declaration under Section 564(b)(1) of the Act, 21 U.S.C. section 360bbb-3(b)(1), unless the authorization is  terminated or revoked.  Performed at [facility], [place] [address] [place], [place] [address]     Antimicrobials: Anti-infectives (From admission, onward)    Start     Dose/Rate Route Frequency Ordered Stop  02/07/22 0300  Ampicillin-Sulbactam (UNASYN) 3 g in sodium chloride 0.9 % 100 mL IVPB        3 g 200 mL/hr over 30 Minutes Intravenous Every 12 hours 02/07/22 0229        Culture/Microbiology    Component Value Date/Time   SDES  08/29/2021 0002    URINE, CLEAN CATCH Performed at [facility], [place] [address], [place], [place] [address]    SPECREQUEST  08/29/2021 0002    NONE Performed at [facility], [place] [address], [place], [place] [address]    CULT  08/29/2021 0002    NO GROWTH Performed at [facility] [address] [place], [place] [address]    REPTSTATUS 08/31/2021 FINAL 08/29/2021 0002    Radiology Studies: [facility] Chest Port 1 View  Result Date: 02/06/2022 CLINICAL DATA:  Shortness of breath. EXAM: PORTABLE CHEST 1 VIEW COMPARISON:  Radiograph 08/30/2021. CT 01/02/2021 FINDINGS: Stable heart size and mediastinal contours. Aortic atherosclerosis. Chronic left lung volume loss. Chronic right upper lobe and right mid lung opacity corresponds to bronchiectasis and mucoid impaction on prior CT. The chronic left basilar opacities are better appreciated on prior CT with only limited radiographic correlate. No acute airspace disease. No pleural effusion. Chronic left shoulder arthropathy. IMPRESSION: 1. No acute findings. 2. Chronic right upper lobe and right mid lung opacity corresponds to bronchiectasis and mucoid impaction on prior CT. Electronically Signed   By: Keith Rake M.D.   On: 02/06/2022 20:02     LOS: 0 days   Antonieta Pert, MD [facility]  02/07/2022, 12:53 PM

## 2022-02-07 NOTE — Evaluation (Signed)
Physical Therapy Evaluation Patient Details Name: Katie Alvarado MRN: [id number] DOB: 28-Jan-1926 Today's Date: 02/07/2022  History of Present Illness  Katie Alvarado is a 88 y.o. female with medical history significant for COPD, bronchiectasis, history of Mycobacterium Avium-intracellulare complex, type 2 diabetes, hypertension, GERD, HFpEF, history of multifocal mucous plugging at the lung bases who presented to [facility] ED due to worsening shortness of breath.  Associated with cough and wheezing.  Clinical Impression  Pt admitted with above diagnosis.  Pt currently with functional limitations due to the deficits listed below (see PT Problem List). Pt will benefit from skilled PT to increase their independence and safety with mobility to allow discharge to the venue listed below.  Pt with new tremors, weakness, and cough.  She needs 24/7 S/assistance at this time.  She would like to go home, but lives alone.  If her caregivers/aides can provide 24 hour care then this may be doable with HHPT.  Otherwise SNF is recommended.        Recommendations for follow up therapy are one component of a multi-disciplinary discharge planning process, led by the attending physician.  Recommendations may be updated based on patient status, additional functional criteria and insurance authorization.  Follow Up Recommendations Skilled nursing-short term rehab (<3 hours/day) (Pt may refuse. If caregivers can provide 24/7 A, then HHPT may be an option.) Can patient physically be transported by private vehicle: Yes    Assistance Recommended at Discharge Frequent or constant Supervision/Assistance  Patient can return home with the following  A little help with walking and/or transfers;A little help with bathing/dressing/bathroom;Assistance with cooking/housework;Assist for transportation;Help with stairs or ramp for entrance    Equipment Recommendations None recommended by PT  Recommendations for Other Services        Functional Status Assessment       Precautions / Restrictions Precautions Precautions: Fall Restrictions Weight Bearing Restrictions: No      Mobility  Bed Mobility Overal bed mobility: Needs Assistance Bed Mobility: Supine to Sit, Sit to Supine     Supine to sit: Min assist Sit to supine: Min assist   General bed mobility comments: MIN A with some assistance for LE and for managing lines. Tremors noted which she reports are new.    Transfers Overall transfer level: Needs assistance Equipment used: Rolling walker (2 wheels) Transfers: Sit to/from Stand Sit to Stand: Mod assist, Min assist           General transfer comment: MOD A to keep feet from sliding., Once she got feet under knees stood with  MIN A on 4th attempt.  Legs shaking with tremors. BP 160/71 after sitting down.    Ambulation/Gait               General Gait Details: NT due to tremors and lines.  Stairs            Wheelchair Mobility    Modified Rankin (Stroke Patients Only)       Balance Overall balance assessment: Needs assistance   Sitting balance-Leahy Scale: Fair       Standing balance-Leahy Scale: Poor                               Pertinent Vitals/Pain Pain Assessment Pain Assessment: Faces Faces Pain Scale: Hurts a little bit Pain Location: headache Pain Descriptors / Indicators: Aching Pain Intervention(s): Monitored during session    Home Living Family/patient expects to be discharged  to:: Private residence Living Arrangements: Alone Available Help at Discharge: Personal care attendant Type of Home: House Home Access: Hooks: One [place]: Rollator (4 wheels)      Prior Function Prior Level of Function : Needs assist             Mobility Comments: typically ambulatory with rollator ADLs Comments: Aide 8A-11A Mon-Wed and Fri. Katie Alvarado, friend, comes at 2 PM and will spend the night if she feels  poorly.  He takes care of dinner and takes her to MD appointments.     Hand Dominance        Extremity/Trunk Assessment   Upper Extremity Assessment Upper Extremity Assessment: Defer to OT evaluation    Lower Extremity Assessment Lower Extremity Assessment: Generalized weakness       Communication      Cognition Arousal/Alertness: Awake/alert Behavior During Therapy: WFL for tasks assessed/performed Overall Cognitive Status: Within Functional Limits for tasks assessed                                          General Comments General comments (skin integrity, edema, etc.): tremors, deep cough throughout session. o2 intact at 2.5 L    Exercises     Assessment/Plan    PT Assessment Patient needs continued PT services  PT Problem List Decreased strength;Decreased mobility;Decreased balance;Decreased knowledge of use of DME       PT Treatment Interventions DME instruction;Gait training;Functional mobility training;Therapeutic activities;Therapeutic exercise;Balance training    PT Goals (Current goals can be found in the Care Plan section)  Acute Rehab PT Goals Patient Stated Goal: Pt wants to go home. PT Goal Formulation: With patient Time For Goal Achievement: 02/21/22 Potential to Achieve Goals: Good    Frequency Min 3X/week     Co-evaluation               AM-PAC PT "6 Clicks" Mobility  Outcome Measure Help needed turning from your back to your side while in a flat bed without using bedrails?: A Little Help needed moving from lying on your back to sitting on the side of a flat bed without using bedrails?: A Little Help needed moving to and from a bed to a chair (including a wheelchair)?: A Lot Help needed standing up from a chair using your arms (e.g., wheelchair or bedside chair)?: A Lot Help needed to walk in hospital room?: A Lot Help needed climbing 3-5 steps with a railing? : A Lot 6 Click Score: 14    End of Session Equipment  Utilized During Treatment: Gait belt Activity Tolerance: Patient limited by fatigue Patient left: in bed;with call bell/phone within reach Nurse Communication: Mobility status PT Visit Diagnosis: Unsteadiness on feet (R26.81);Other abnormalities of gait and mobility (R26.89);Difficulty in walking, not elsewhere classified (R26.2)    Time: 3094-0768 PT Time Calculation (min) (ACUTE ONLY): 22 min   Charges:   PT Evaluation $PT Eval Moderate Complexity: 1 Mod          Mirjana Tarleton L. Tamala Julian, PT  02/07/2022   Katie Alvarado 02/07/2022, 11:37 AM

## 2022-02-07 NOTE — Plan of Care (Signed)

## 2022-02-08 DIAGNOSIS — J441 Chronic obstructive pulmonary disease with (acute) exacerbation: Secondary | ICD-10-CM | POA: Diagnosis not present

## 2022-02-08 LAB — CBC
HCT: 30.7 % — ABNORMAL LOW (ref 36.0–46.0)
Hemoglobin: 9.8 g/dL — ABNORMAL LOW (ref 12.0–15.0)
MCH: 28.7 pg (ref 26.0–34.0)
MCHC: 31.9 g/dL (ref 30.0–36.0)
MCV: 89.8 fL (ref 80.0–100.0)
Platelets: 237 10*3/uL (ref 150–400)
RBC: 3.42 MIL/uL — ABNORMAL LOW (ref 3.87–5.11)
RDW: 14.1 % (ref 11.5–15.5)
WBC: 10.3 10*3/uL (ref 4.0–10.5)
nRBC: 0 % (ref 0.0–0.2)

## 2022-02-08 LAB — GLUCOSE, CAPILLARY
Glucose-Capillary: 123 mg/dL — ABNORMAL HIGH (ref 70–99)
Glucose-Capillary: 83 mg/dL (ref 70–99)
Glucose-Capillary: 246 mg/dL — ABNORMAL HIGH (ref 70–99)
Glucose-Capillary: 193 mg/dL — ABNORMAL HIGH (ref 70–99)

## 2022-02-08 LAB — BASIC METABOLIC PANEL
Anion gap: 8 (ref 5–15)
BUN: 38 mg/dL — ABNORMAL HIGH (ref 8–23)
CO2: 29 mmol/L (ref 22–32)
Calcium: 8.7 mg/dL — ABNORMAL LOW (ref 8.9–10.3)
Chloride: 100 mmol/L (ref 98–111)
Creatinine, Ser: 1.07 mg/dL — ABNORMAL HIGH (ref 0.44–1.00)
GFR, Estimated: 48 mL/min — ABNORMAL LOW (ref >60)
Glucose, Bld: 138 mg/dL — ABNORMAL HIGH (ref 70–99)
Potassium: 3.6 mmol/L (ref 3.5–5.1)
Sodium: 137 mmol/L (ref 135–145)

## 2022-02-08 LAB — PROCALCITONIN: Procalcitonin: <0.1 ng/mL

## 2022-02-08 MED ORDER — BENZONATATE 100 MG PO CAPS
100.0000 mg | ORAL_CAPSULE | Freq: Once | ORAL | Status: AC
  Administered 2022-02-08: 100 mg via ORAL
  Filled 2022-02-08: qty 1

## 2022-02-08 MED ORDER — IPRATROPIUM-ALBUTEROL 0.5-2.5 (3) MG/3ML IN SOLN
3.0000 mL | Freq: Four times a day (QID) | RESPIRATORY_TRACT | Status: DC
  Administered 2022-02-08 (×3): 3 mL via RESPIRATORY_TRACT
  Filled 2022-02-08 (×3): qty 3

## 2022-02-08 MED ORDER — HYDROCOD POLI-CHLORPHE POLI ER 10-8 MG/5ML PO SUER
2.5000 mL | Freq: Two times a day (BID) | ORAL | Status: DC | PRN
  Administered 2022-02-08 – 2022-02-18 (×12): 2.5 mL via ORAL
  Filled 2022-02-08 (×12): qty 5

## 2022-02-08 MED ORDER — MENTHOL 3 MG MT LOZG
1.0000 | LOZENGE | OROMUCOSAL | Status: DC | PRN
  Administered 2022-02-15: 3 mg via ORAL
  Filled 2022-02-08: qty 9

## 2022-02-08 NOTE — Progress Notes (Signed)
PROGRESS NOTE Katie Alvarado  PYK:[id number] DOB: 1926/04/04 DOA: 02/06/2022 PCP: [name], MD   Brief Narrative/Hospital Course: 88 y.o. female with medical history significant for COPD, bronchiectasis, history of Mycobacterium Avium-intracellulare complex(from positive sputum culture on 06/09/2012), type 2 diabetes, hypertension, GERD, HFpEF, history of multifocal mucous plugging at the lung bases (08/30/21), who presented to [facility] ED due to worsening shortness of breath.  Associated with cough and wheezing.  Onset of symptoms about a week ago, acutely worsened today.  EMS was activated.  Upon EMS arrival she received DuoNebs treatment, IV Solu-Medrol, in route.   In the ED, COVID-19 screening test negative.  No evidence of pneumonia on chest x-ray.  Initially placed on BiPAP, with improvement after treatment with, nebulizer, IV magnesium, and IV steroids via EMS.  She was taken off the BiPAP and placed on 3 L [place] with O2 saturation in the high 90s.  EDP requested admission for further management.  The patient was admitted by [facility], hospitalist service.   ED Course: Tmax 99.1.  BP 111/53, pulse 59, respiratory rate 15, O2 saturation 97% on 3 L.  Lab studies remarkable for serum potassium 3.4, glucose 214, BUN 32, creatinine 1.10.  GFR 47.  Baseline creatinine 0.8 with GFR greater than 60.  BNP 154.  Hemoglobin 10.7 at baseline.  WBC 7.3.  Platelet count 220.    Subjective: Seen this am c/o ongoing dry cough, otherwise feels better Overnight afebrile, on 2 L nasal cannula BP 120-150s Lasix stable renal function, procalcitonin and anemia  Assessment and Plan: Principal Problem:   COPD with acute exacerbation (HCC) Active Problems:   Acute respiratory failure with hypoxia (HCC)   Acute COPD exacerbation Mild to moderate bronchiectasis exacerbation: Acute hypoxic respiratory failure: History of bronchiectasis, most recent sputum culture revealed normal respiratory flora no staph or  Pseudomonas.sputum culture ordered -but not done as no sputum for culture yet.  On empiric Unasyn.    Cont on pulmonary toileting bronchodilators, antitussives, will do steroid supplemental oxygen provided. Cont PT OT ambulation and wean oxygen as tolerated.   T2DM with uncontrolled hyperglycemia: Well controlled,continue Glucotrol, sliding scale and monitor as below. HBA1c stable 6.8 Recent Labs  Lab 02/07/22 0530 02/07/22 0910 02/07/22 1214 02/07/22 1755 02/07/22 2112 02/08/22 0740  GLUCAP  --  234* 216* 171* 156* 123*  HGBA1C 6.8*  --   --   --   --   --    Anxiety/depression:Mood is stable.not on meds. Hypokalemia resolved. GERD continue PPI. Hypertension: BP well controlled continue home hydralazine, torsemide and metoprolol.  Last echo from 2021 reviewed EF 60 to 65%, G1 DD Normocytic anemia: 9 to 10 g.  Monitor suspect due to chronic disease. CKD stage IIIa creatinine holding at one-point appears to be baseline  Physical debility/deconditioning PT OT eval obtained advising skilled nursing facility   Goals of care: I discussed with regarding her CODE STATUS patient confirmed that she is DNR do not want intubation or CPR.RT at bedside and witnessed the discussion.  DVT prophylaxis: enoxaparin (LOVENOX) injection 30 mg Start: 02/07/22 1000 Code Status:   Code Status: DNR Family Communication: plan of care discussed with patient at bedside. Patient status is: Inpatient due to ongoing acute hypoxic respiratory failure, COPD exacerbation   Updated her granddaughter  Level of care: Telemetry  Dispo: The patient is from: home lives alone            Anticipated disposition:SNF recommended but she is reluctant to go. Toc consulted  Mobility  Assessment (last 72 hours)     Mobility Assessment     Row Name 02/07/22 2137 02/07/22 1645 02/07/22 1133 02/07/22 10:25:35     Does patient have an order for bedrest or is patient medically unstable No - Continue assessment -- -- No -  Continue assessment    What is the highest level of mobility based on the progressive mobility assessment? Level 5 (Walks with assist in room/hall) - Balance while stepping forward/back and can walk in room with assist - Complete Level 3 (Stands with assist) - Balance while standing  and cannot march in place Level 3 (Stands with assist) - Balance while standing  and cannot march in place --              Objective: Vitals last 24 hrs: Vitals:   02/07/22 1924 02/07/22 2137 02/08/22 0100 02/08/22 0630  BP:  (!) 112/57 134/69 (!) 154/73  Pulse:  82 81 80  Resp:  18  18  Temp:  98.4 F (36.9 C) 97.6 F (36.4 C) (!) 97.3 F (36.3 C)  TempSrc:  Oral Oral Oral  SpO2: 96% 96% 95% 97%  Weight:       Weight change:   Physical Examination: General exam: alert awake oriented,older than stated age HEENT:Oral mucosa moist, Ear/Nose WNL grossly Respiratory system: Bilaterally diminished breath sound, mild wheezing, coughing,no use of accessory muscle Cardiovascular system: S1 & S2 +, No JVD. Gastrointestinal system: Abdomen soft,NT,ND, BS+ Nervous System: Alert, awake, moving extremities, she follows commands. Extremities: LE edema neg,distal peripheral pulses palpable.  Skin: No rashes,no icterus. MSK: Normal muscle bulk,tone, power   Medications reviewed:  Scheduled Meds:  aspirin EC  81 mg Oral Daily   budesonide  0.25 mg Nebulization BID   enoxaparin (LOVENOX) injection  30 mg Subcutaneous Q24H   fluticasone  1 spray Each Nare Daily   glipiZIDE  5 mg Oral QAC breakfast   guaiFENesin  600 mg Oral BID   hydrALAZINE  50 mg Oral TID   influenza vaccine adjuvanted  0.5 mL Intramuscular Tomorrow-1000   insulin aspart  0-5 Units Subcutaneous QHS   insulin aspart  0-9 Units Subcutaneous TID WC   ipratropium-albuterol  3 mL Nebulization Q6H WA   metoprolol tartrate  25 mg Oral QHS   metoprolol tartrate  50 mg Oral BID   pantoprazole  40 mg Oral Daily   predniSONE  40 mg Oral Q  breakfast   sodium chloride HYPERTONIC  4 mL Nebulization BID   torsemide  20 mg Oral Daily   vitamin E  400 Units Oral Once per day on Mon Thu   Continuous Infusions:  ampicillin-sulbactam (UNASYN) IV 3 g (02/08/22 0238)      Diet Order             Diet heart healthy/carb modified Room service appropriate? Yes; Fluid consistency: Thin  Diet effective now                  Intake/Output Summary (Last 24 hours) at 02/08/2022 0758 Last data filed at 02/08/2022 0630 Gross per 24 hour  Intake 464.32 ml  Output 1000 ml  Net -535.68 ml    Net IO Since Admission: -388.79 mL [02/08/22 0758]  Wt Readings from Last 3 Encounters:  02/07/22 54 kg  09/27/21 54.4 kg  09/01/21 55.6 kg     Unresulted Labs (From admission, onward)     Start     Ordered   02/13/22 0500  Creatinine, serum  (enoxaparin (  LOVENOX)    CrCl >/= 30 ml/min)  Weekly,   R     Comments: while on enoxaparin therapy    02/06/22 2258   02/08/22 0500  Procalcitonin  Daily at 5am,   R      02/07/22 0742   02/08/22 7035  Basic metabolic panel  Daily at 5am,   R      02/07/22 1741   02/08/22 0500  CBC  Daily at 5am,   R      02/07/22 1741   02/07/22 0229  Expectorated Sputum Assessment w Gram Stain, Rflx to Resp Cult  Once,   R       Question:  Patient immune status  Answer:  Normal   02/07/22 0228          Data Reviewed: I have personally reviewed following labs and imaging studies CBC: Recent Labs  Lab 02/06/22 1935 02/07/22 0530 02/08/22 0527  WBC 7.3 4.6 10.3  NEUTROABS 5.1  --   --   HGB 10.7* 10.3* 9.8*  HCT 32.8* 32.0* 30.7*  MCV 88.2 88.4 89.8  PLT 220 213 009    Basic Metabolic Panel: Recent Labs  Lab 02/06/22 1935 02/07/22 0530 02/08/22 0527  NA 135 135 137  K 3.4* 3.7 3.6  CL 94* 95* 100  CO2 '29 27 29  '$ GLUCOSE 214* 238* 138*  BUN 32* 36* 38*  CREATININE 1.10* 1.20* 1.07*  CALCIUM 8.7* 8.9 8.7*  MG  --  2.3  --   PHOS  --  4.3  --     GFR: Estimated Creatinine  Clearance: 27.4 mL/min (A) (by C-G formula based on SCr of 1.07 mg/dL (H)). Liver Function Tests: Recent Labs  Lab 02/07/22 0530  ALBUMIN 3.4*   CBG: Recent Labs  Lab 02/07/22 0910 02/07/22 1214 02/07/22 1755 02/07/22 2112 02/08/22 0740  GLUCAP 234* 216* 171* 156* 123*    Recent Results (from the past 240 hour(s))  Resp Panel by RT-PCR (Flu A&B, Covid) Anterior Nasal Swab     Status: None   Collection Time: 02/06/22  7:35 PM   Specimen: Anterior Nasal Swab  Result Value Ref Range Status   SARS Coronavirus 2 by RT PCR NEGATIVE NEGATIVE Final    Comment: (NOTE) SARS-CoV-2 target nucleic acids are NOT DETECTED.  The SARS-CoV-2 RNA is generally detectable in upper respiratory specimens during the acute phase of infection. The lowest concentration of SARS-CoV-2 viral copies this assay can detect is 138 copies/mL. A negative result does not preclude SARS-Cov-2 infection and should not be used as the sole basis for treatment or other patient management decisions. A negative result may occur with  improper specimen collection/handling, submission of specimen other than nasopharyngeal swab, presence of viral mutation(s) within the areas targeted by this assay, and inadequate number of viral copies(<138 copies/mL). A negative result must be combined with clinical observations, patient history, and epidemiological information. The expected result is Negative.  Fact Sheet for Patients:  EntrepreneurPulse.com.au  Fact Sheet for Healthcare Providers:  IncredibleEmployment.be  This test is no t yet approved or cleared by the [place] FDA and  has been authorized for detection and/or diagnosis of SARS-CoV-2 by FDA under an Emergency Use Authorization (EUA). This EUA will remain  in effect (meaning this test can be used) for the duration of the COVID-19 declaration under Section 564(b)(1) of the Act, 21 U.S.C.section 360bbb-3(b)(1), unless the  authorization is terminated  or revoked sooner.       Influenza A by  PCR NEGATIVE NEGATIVE Final   Influenza B by PCR NEGATIVE NEGATIVE Final    Comment: (NOTE) The Xpert Xpress SARS-CoV-2/FLU/RSV plus assay is intended as an aid in the diagnosis of influenza from Nasopharyngeal swab specimens and should not be used as a sole basis for treatment. Nasal washings and aspirates are unacceptable for Xpert Xpress SARS-CoV-2/FLU/RSV testing.  Fact Sheet for Patients: EntrepreneurPulse.com.au  Fact Sheet for Healthcare Providers: IncredibleEmployment.be  This test is not yet approved or cleared by the [place] FDA and has been authorized for detection and/or diagnosis of SARS-CoV-2 by FDA under an Emergency Use Authorization (EUA). This EUA will remain in effect (meaning this test can be used) for the duration of the COVID-19 declaration under Section 564(b)(1) of the Act, 21 U.S.C. section 360bbb-3(b)(1), unless the authorization is terminated or revoked.  Performed at [facility], [place] [address] [place], [place] [address]     Antimicrobials: Anti-infectives (From admission, onward)    Start     Dose/Rate Route Frequency Ordered Stop   02/07/22 0300  Ampicillin-Sulbactam (UNASYN) 3 g in sodium chloride 0.9 % 100 mL IVPB        3 g 200 mL/hr over 30 Minutes Intravenous Every 12 hours 02/07/22 0229        Culture/Microbiology    Component Value Date/Time   SDES  08/29/2021 0002    URINE, CLEAN CATCH Performed at [facility], [place] [address], [place], [place] [address]    SPECREQUEST  08/29/2021 0002    NONE Performed at [facility], [place] [address] [place], [place] [address]    CULT  08/29/2021 0002    NO GROWTH Performed at [place] [address], [place], [place] [address]    REPTSTATUS 08/31/2021 FINAL 08/29/2021 0002    Radiology Studies: [facility] Chest  Port 1 View  Result Date: 02/06/2022 CLINICAL DATA:  Shortness of breath. EXAM: PORTABLE CHEST 1 VIEW COMPARISON:  Radiograph 08/30/2021. CT 01/02/2021 FINDINGS: Stable heart size and mediastinal contours. Aortic atherosclerosis. Chronic left lung volume loss. Chronic right upper lobe and right mid lung opacity corresponds to bronchiectasis and mucoid impaction on prior CT. The chronic left basilar opacities are better appreciated on prior CT with only limited radiographic correlate. No acute airspace disease. No pleural effusion. Chronic left shoulder arthropathy. IMPRESSION: 1. No acute findings. 2. Chronic right upper lobe and right mid lung opacity corresponds to bronchiectasis and mucoid impaction on prior CT. Electronically Signed   By: Keith Rake M.D.   On: 02/06/2022 20:02     LOS: 1 day   Antonieta Pert, MD [facility]  02/08/2022, 7:58 AM

## 2022-02-09 DIAGNOSIS — J441 Chronic obstructive pulmonary disease with (acute) exacerbation: Secondary | ICD-10-CM | POA: Diagnosis not present

## 2022-02-09 LAB — BASIC METABOLIC PANEL
Anion gap: 9 (ref 5–15)
BUN: 40 mg/dL — ABNORMAL HIGH (ref 8–23)
CO2: 31 mmol/L (ref 22–32)
Calcium: 8.8 mg/dL — ABNORMAL LOW (ref 8.9–10.3)
Chloride: 98 mmol/L (ref 98–111)
Creatinine, Ser: 1.05 mg/dL — ABNORMAL HIGH (ref 0.44–1.00)
GFR, Estimated: 49 mL/min — ABNORMAL LOW (ref >60)
Glucose, Bld: 89 mg/dL (ref 70–99)
Potassium: 3.7 mmol/L (ref 3.5–5.1)
Sodium: 138 mmol/L (ref 135–145)

## 2022-02-09 LAB — GLUCOSE, CAPILLARY
Glucose-Capillary: 99 mg/dL (ref 70–99)
Glucose-Capillary: 130 mg/dL — ABNORMAL HIGH (ref 70–99)
Glucose-Capillary: 305 mg/dL — ABNORMAL HIGH (ref 70–99)
Glucose-Capillary: 178 mg/dL — ABNORMAL HIGH (ref 70–99)

## 2022-02-09 LAB — CBC
HCT: 31.2 % — ABNORMAL LOW (ref 36.0–46.0)
Hemoglobin: 10 g/dL — ABNORMAL LOW (ref 12.0–15.0)
MCH: 28.5 pg (ref 26.0–34.0)
MCHC: 32.1 g/dL (ref 30.0–36.0)
MCV: 88.9 fL (ref 80.0–100.0)
Platelets: 265 10*3/uL (ref 150–400)
RBC: 3.51 MIL/uL — ABNORMAL LOW (ref 3.87–5.11)
RDW: 14.3 % (ref 11.5–15.5)
WBC: 7.2 10*3/uL (ref 4.0–10.5)
nRBC: 0 % (ref 0.0–0.2)

## 2022-02-09 LAB — PROCALCITONIN: Procalcitonin: <0.1 ng/mL

## 2022-02-09 MED ORDER — PREDNISONE 20 MG PO TABS: 20.0000 mg | ORAL_TABLET | Freq: Every day | ORAL | 0 refills | Status: DC

## 2022-02-09 MED ORDER — MENTHOL 3 MG MT LOZG: 1.0000 | LOZENGE | OROMUCOSAL | 12 refills | Status: DC | PRN

## 2022-02-09 MED ORDER — IPRATROPIUM-ALBUTEROL 0.5-2.5 (3) MG/3ML IN SOLN
3.0000 mL | Freq: Three times a day (TID) | RESPIRATORY_TRACT | Status: DC
  Administered 2022-02-09 – 2022-02-11 (×7): 3 mL via RESPIRATORY_TRACT
  Filled 2022-02-09 (×8): qty 3

## 2022-02-09 MED ORDER — GUAIFENESIN-DM 100-10 MG/5ML PO SYRP: 5.0000 mL | ORAL_SOLUTION | ORAL | 0 refills | Status: AC | PRN

## 2022-02-09 MED ORDER — AMOXICILLIN-POT CLAVULANATE 875-125 MG PO TABS: 1.0000 | ORAL_TABLET | Freq: Two times a day (BID) | ORAL | 0 refills | Status: DC

## 2022-02-09 MED ORDER — PREDNISONE 20 MG PO TABS
30.0000 mg | ORAL_TABLET | Freq: Every day | ORAL | Status: DC
  Administered 2022-02-10 – 2022-02-11 (×2): 30 mg via ORAL
  Filled 2022-02-09 (×2): qty 1

## 2022-02-09 NOTE — Progress Notes (Signed)
PROGRESS NOTE Katie Alvarado  SNK:[id number] DOB: 1925-08-18 DOA: 02/06/2022 PCP: [name], MD   Brief Narrative/Hospital Course: 88 y.o. female with medical history significant for COPD, bronchiectasis, history of Mycobacterium Avium-intracellulare complex(from positive sputum culture on 06/09/2012), type 2 diabetes, hypertension, GERD, HFpEF, history of multifocal mucous plugging at the lung bases (08/30/21), who presented to [facility] ED due to worsening shortness of breath.  Associated with cough and wheezing.  Onset of symptoms about a week ago, acutely worsened today.  EMS was activated.  Upon EMS arrival she received DuoNebs treatment, IV Solu-Medrol, in route.   In the ED, COVID-19 screening test negative.  No evidence of pneumonia on chest x-ray.  Initially placed on BiPAP, with improvement after treatment with, nebulizer, IV magnesium, and IV steroids via EMS.  She was taken off the BiPAP and placed on 3 L [place] with O2 saturation in the high 90s.  EDP requested admission for further management.  The patient was admitted by [facility], hospitalist service.   ED Course: Tmax 99.1.  BP 111/53, pulse 59, respiratory rate 15, O2 saturation 97% on 3 L.  Lab studies remarkable for serum potassium 3.4, glucose 214, BUN 32, creatinine 1.10.  GFR 47.  Baseline creatinine 0.8 with GFR greater than 60.  BNP 154.  Hemoglobin 10.7 at baseline.  WBC 7.3.  Platelet count 220. Managed with systemic steroids, IV antibiotics antitussives supplemental oxygen Patient overall clinically improving    Subjective: Seen and examined this morning.  She feels much improved no more cough after taking cough medication and steroids.   Able to wean off to room air but hypoxic short of breath on ambulation   Assessment and Plan: Principal Problem:   COPD with acute exacerbation ([employer]) Active Problems:   Acute respiratory failure with hypoxia (HCC)   Acute COPD exacerbation Mild to moderate bronchiectasis  exacerbation: Acute hypoxic respiratory failure: History of bronchiectasis, most recent sputum culture revealed normal respiratory flora no staph or Pseudomonas.sputum culture ordered -but not done as no sputum for culture yet.  Clinically much improved, continue with empiric Unasyn, oral prednisone, bronchodilators antitussives, saline nebulizer.  Continue to mobilize.  She plans to return home does not go to SNF.   T2DM with uncontrolled hyperglycemia: Blood sugars controlled .  Continue Glucotrol, sliding scale and monitor as below. HBA1c stable 6.8 Recent Labs  Lab 02/07/22 0530 02/07/22 0910 02/08/22 1121 02/08/22 1626 02/08/22 2120 02/09/22 0750 02/09/22 1152  GLUCAP  --    < > 83 246* 193* 99 130*  HGBA1C 6.8*  --   --   --   --   --   --    < > = values in this interval not displayed.  Anxiety/depression: Stable,not on meds. Hypokalemia resolved. GERD continue PPI. Hypertension: BP well controlled on her PTA hydralazine, torsemide and metoprolol.  Last echo from 2021 reviewed EF 60 to 65%, G1 DD Normocytic anemia: 9 to 10 g.  Monitor suspect due to chronic disease. CKD stage IIIa creatinine holding at one-point appears to be baseline  Physical debility/deconditioning PT OT eval done mobilizing well, does not want to go to SNF as advised.  Has a caregiver at home.  We will set up home health.   Goals of care: I discussed with regarding her CODE STATUS patient confirmed that she is DNR do not want intubation or CPR.RT at bedside and witnessed the discussion.  DVT prophylaxis: enoxaparin (LOVENOX) injection 30 mg Start: 02/07/22 1000 Code Status:   Code Status:  DNR Family Communication: plan of care discussed with patient at bedside.  Patient status is: Inpatient due to ongoing acute hypoxic respiratory failure, COPD exacerbation   Updated her granddaughter 02/08/22 Level of care: Telemetry  Dispo: The patient is from: home lives alone            Anticipated disposition:  Home with home health tomorrow, declined to go to SNF.    Objective: Vitals last 24 hrs: Vitals:   02/08/22 1930 02/08/22 2209 02/09/22 0632 02/09/22 1301  BP:  128/60 (!) 144/64 (!) 123/58  Pulse:  70 61 66  Resp:  '18 20 18  '$ Temp:  97.7 F (36.5 C) 97.9 F (36.6 C) 97.8 F (36.6 C)  TempSrc:  Oral Oral Oral  SpO2: 95% 97% 98% 91%  Weight:       Weight change:   Physical Examination: General exam: AA, pleasant, oriented weak,older appearing HEENT:Oral mucosa moist, Ear/Nose WNL grossly, dentition normal. Respiratory system: bilaterally clear breath sounds minimal wheezing, no use of accessory muscle Cardiovascular system: S1 & S2 +, regular rate, JVD neg. Gastrointestinal system: Abdomen soft, NT,ND,BS+ Nervous System:Alert, awake, moving extremities and grossly nonfocal Extremities: LE ankle edema neg, lower extremities warm Skin: No rashes,no icterus. MSK: Normal muscle bulk,tone, power   Medications reviewed:  Scheduled Meds:  aspirin EC  81 mg Oral Daily   budesonide  0.25 mg Nebulization BID   enoxaparin (LOVENOX) injection  30 mg Subcutaneous Q24H   fluticasone  1 spray Each Nare Daily   glipiZIDE  5 mg Oral QAC breakfast   hydrALAZINE  50 mg Oral TID   insulin aspart  0-5 Units Subcutaneous QHS   insulin aspart  0-9 Units Subcutaneous TID WC   ipratropium-albuterol  3 mL Nebulization TID   metoprolol tartrate  25 mg Oral QHS   metoprolol tartrate  50 mg Oral BID   pantoprazole  40 mg Oral Daily   [START ON 02/10/2022] predniSONE  30 mg Oral Q breakfast   sodium chloride HYPERTONIC  4 mL Nebulization BID   torsemide  20 mg Oral Daily   vitamin E  400 Units Oral Once per day on Mon Thu   Continuous Infusions:  ampicillin-sulbactam (UNASYN) IV 3 g (02/09/22 0304)      Diet Order             Diet heart healthy/carb modified Room service appropriate? Yes; Fluid consistency: Thin  Diet effective now                  Intake/Output Summary (Last 24  hours) at 02/09/2022 1314 Last data filed at 02/09/2022 0750 Gross per 24 hour  Intake 340 ml  Output 1750 ml  Net -1410 ml   Net IO Since Admission: -2,298.79 mL [02/09/22 1314]  Wt Readings from Last 3 Encounters:  02/07/22 54 kg  09/27/21 54.4 kg  09/01/21 55.6 kg     Unresulted Labs (From admission, onward)     Start     Ordered   02/13/22 0500  Creatinine, serum  (enoxaparin (LOVENOX)    CrCl >/= 30 ml/min)  Weekly,   R     Comments: while on enoxaparin therapy    02/06/22 2258   02/08/22 9924  Basic metabolic panel  Daily at 5am,   R      02/07/22 1741   02/08/22 0500  CBC  Daily at 5am,   R      02/07/22 1741   02/07/22 0229  Expectorated Sputum Assessment  w Gram Stain, Rflx to Resp Cult  Once,   R       Question:  Patient immune status  Answer:  Normal   02/07/22 0228          Data Reviewed: I have personally reviewed following labs and imaging studies CBC: Recent Labs  Lab 02/06/22 1935 02/07/22 0530 02/08/22 0527 02/09/22 0618  WBC 7.3 4.6 10.3 7.2  NEUTROABS 5.1  --   --   --   HGB 10.7* 10.3* 9.8* 10.0*  HCT 32.8* 32.0* 30.7* 31.2*  MCV 88.2 88.4 89.8 88.9  PLT 220 213 237 258   Basic Metabolic Panel: Recent Labs  Lab 02/06/22 1935 02/07/22 0530 02/08/22 0527 02/09/22 0618  NA 135 135 137 138  K 3.4* 3.7 3.6 3.7  CL 94* 95* 100 98  CO2 '29 27 29 31  '$ GLUCOSE 214* 238* 138* 89  BUN 32* 36* 38* 40*  CREATININE 1.10* 1.20* 1.07* 1.05*  CALCIUM 8.7* 8.9 8.7* 8.8*  MG  --  2.3  --   --   PHOS  --  4.3  --   --    GFR: Estimated Creatinine Clearance: 27.9 mL/min (A) (by C-G formula based on SCr of 1.05 mg/dL (H)). Liver Function Tests: Recent Labs  Lab 02/07/22 0530  ALBUMIN 3.4*  CBG: Recent Labs  Lab 02/08/22 1121 02/08/22 1626 02/08/22 2120 02/09/22 0750 02/09/22 1152  GLUCAP 83 246* 193* 99 130*   Recent Results (from the past 240 hour(s))  Resp Panel by RT-PCR (Flu A&B, Covid) Anterior Nasal Swab     Status: None    Collection Time: 02/06/22  7:35 PM   Specimen: Anterior Nasal Swab  Result Value Ref Range Status   SARS Coronavirus 2 by RT PCR NEGATIVE NEGATIVE Final    Comment: (NOTE) SARS-CoV-2 target nucleic acids are NOT DETECTED.  The SARS-CoV-2 RNA is generally detectable in upper respiratory specimens during the acute phase of infection. The lowest concentration of SARS-CoV-2 viral copies this assay can detect is 138 copies/mL. A negative result does not preclude SARS-Cov-2 infection and should not be used as the sole basis for treatment or other patient management decisions. A negative result may occur with  improper specimen collection/handling, submission of specimen other than nasopharyngeal swab, presence of viral mutation(s) within the areas targeted by this assay, and inadequate number of viral copies(<138 copies/mL). A negative result must be combined with clinical observations, patient history, and epidemiological information. The expected result is Negative.  Fact Sheet for Patients:  EntrepreneurPulse.com.au  Fact Sheet for Healthcare Providers:  IncredibleEmployment.be  This test is no t yet approved or cleared by the [place] FDA and  has been authorized for detection and/or diagnosis of SARS-CoV-2 by FDA under an Emergency Use Authorization (EUA). This EUA will remain  in effect (meaning this test can be used) for the duration of the COVID-19 declaration under Section 564(b)(1) of the Act, 21 U.S.C.section 360bbb-3(b)(1), unless the authorization is terminated  or revoked sooner.       Influenza A by PCR NEGATIVE NEGATIVE Final   Influenza B by PCR NEGATIVE NEGATIVE Final    Comment: (NOTE) The Xpert Xpress SARS-CoV-2/FLU/RSV plus assay is intended as an aid in the diagnosis of influenza from Nasopharyngeal swab specimens and should not be used as a sole basis for treatment. Nasal washings and aspirates are unacceptable for  Xpert Xpress SARS-CoV-2/FLU/RSV testing.  Fact Sheet for Patients: EntrepreneurPulse.com.au  Fact Sheet for Healthcare Providers: IncredibleEmployment.be  This  test is not yet approved or cleared by the [place] and has been authorized for detection and/or diagnosis of SARS-CoV-2 by FDA under an Emergency Use Authorization (EUA). This EUA will remain in effect (meaning this test can be used) for the duration of the COVID-19 declaration under Section 564(b)(1) of the Act, 21 U.S.C. section 360bbb-3(b)(1), unless the authorization is terminated or revoked.  Performed at [facility], [place] [address] [place], [place] [address]     Antimicrobials: Anti-infectives (From admission, onward)    Start     Dose/Rate Route Frequency Ordered Stop   02/09/22 0000  amoxicillin-clavulanate (AUGMENTIN) 875-125 MG tablet        1 tablet Oral 2 times daily 02/09/22 1235 02/14/22 2359   02/07/22 0300  Ampicillin-Sulbactam (UNASYN) 3 g in sodium chloride 0.9 % 100 mL IVPB        3 g 200 mL/hr over 30 Minutes Intravenous Every 12 hours 02/07/22 0229        Culture/Microbiology    Component Value Date/Time   SDES  08/29/2021 0002    URINE, CLEAN CATCH Performed at [facility], [place] [address], [place], [place] [address]    SPECREQUEST  08/29/2021 0002    NONE Performed at [facility], [place] [address] [place], [place] [address]    CULT  08/29/2021 0002    NO GROWTH Performed at [place] [address], [place], [place] [address]    REPTSTATUS 08/31/2021 FINAL 08/29/2021 0002    Radiology Studies: No results found.   LOS: 2 days   Antonieta Pert, MD [facility]  02/09/2022, 1:14 PM

## 2022-02-09 NOTE — Progress Notes (Signed)
Pt is resting well at this time. No need of bipap at this time.  

## 2022-02-10 DIAGNOSIS — J441 Chronic obstructive pulmonary disease with (acute) exacerbation: Secondary | ICD-10-CM | POA: Diagnosis not present

## 2022-02-10 LAB — CBC
HCT: 31.8 % — ABNORMAL LOW (ref 36.0–46.0)
Hemoglobin: 10.1 g/dL — ABNORMAL LOW (ref 12.0–15.0)
MCH: 28.4 pg (ref 26.0–34.0)
MCHC: 31.8 g/dL (ref 30.0–36.0)
MCV: 89.3 fL (ref 80.0–100.0)
Platelets: 231 10*3/uL (ref 150–400)
RBC: 3.56 MIL/uL — ABNORMAL LOW (ref 3.87–5.11)
RDW: 13.8 % (ref 11.5–15.5)
WBC: 6.2 10*3/uL (ref 4.0–10.5)
nRBC: 0 % (ref 0.0–0.2)

## 2022-02-10 LAB — BASIC METABOLIC PANEL
Anion gap: 9 (ref 5–15)
BUN: 39 mg/dL — ABNORMAL HIGH (ref 8–23)
CO2: 32 mmol/L (ref 22–32)
Calcium: 8.7 mg/dL — ABNORMAL LOW (ref 8.9–10.3)
Chloride: 95 mmol/L — ABNORMAL LOW (ref 98–111)
Creatinine, Ser: 1.13 mg/dL — ABNORMAL HIGH (ref 0.44–1.00)
GFR, Estimated: 45 mL/min — ABNORMAL LOW (ref >60)
Glucose, Bld: 135 mg/dL — ABNORMAL HIGH (ref 70–99)
Potassium: 4.1 mmol/L (ref 3.5–5.1)
Sodium: 136 mmol/L (ref 135–145)

## 2022-02-10 LAB — GLUCOSE, CAPILLARY
Glucose-Capillary: 106 mg/dL — ABNORMAL HIGH (ref 70–99)
Glucose-Capillary: 199 mg/dL — ABNORMAL HIGH (ref 70–99)
Glucose-Capillary: 293 mg/dL — ABNORMAL HIGH (ref 70–99)
Glucose-Capillary: 185 mg/dL — ABNORMAL HIGH (ref 70–99)

## 2022-02-10 MED ORDER — ACETAMINOPHEN 325 MG PO TABS
650.0000 mg | ORAL_TABLET | Freq: Four times a day (QID) | ORAL | Status: AC | PRN
  Administered 2022-02-10 – 2022-02-11 (×2): 650 mg via ORAL
  Filled 2022-02-10 (×2): qty 2

## 2022-02-10 NOTE — Progress Notes (Signed)
Pt is resting at this time. She is on 2L [place] and O2 saturation is 96. No resp distress noted. No need of bipap at this time.

## 2022-02-10 NOTE — Progress Notes (Signed)
Pharmacy Antibiotic Note  Katie Alvarado is a 88 y.o. female admitted on 02/06/2022 with history of COPD, bronchiectasis, diastolic heart failure, diabetes, presenting to the ED with wheezing and shortness of breath.  Pharmacy has been consulted for unasyn dosing  Plan: Continue Unasyn 3gm IV q12h for CrCl ~ 26 mls/min No dose adjustments anticipated.  Pharmacy will sign off and monitor peripherally via electronic surveillance software for any changes in renal function or micro data.    Weight: 54 kg (119 lb 0.8 oz)  Temp (24hrs), Avg:97.9 F (36.6 C), Min:97.5 F (36.4 C), Max:98.3 F (36.8 C)  Recent Labs  Lab 02/06/22 1935 02/07/22 0530 02/08/22 0527 02/09/22 0618 02/10/22 0547  WBC 7.3 4.6 10.3 7.2 6.2  CREATININE 1.10* 1.20* 1.07* 1.05* 1.13*     Estimated Creatinine Clearance: 26 mL/min (A) (by C-G formula based on SCr of 1.13 mg/dL (H)).    Allergies  Allergen Reactions   Welchol [Colesevelam Hcl] Hives   Amlodipine     Ankle swelling   Atorvastatin     Other reaction(s): foot pain   Coreg [Carvedilol]     dizzy   Doxycycline     rash   Glucosamine Itching and Other (See Comments)    "Breakouts"   Hydrocodone-Acetaminophen     Other reaction(s): did not feel right   Mobic [Meloxicam] Other (See Comments)    Unknown reaction   Sulfa Antibiotics     itching   Telmisartan     itching   Tramadol Nausea Only    *can tolerate 1/2 dose" per husband at bedside   Celecoxib Hives, Itching and Rash   Statins Rash   Antimicrobials this admission:  10/19 Unasyn >>  Dose adjustments this admission:  None  Microbiology results:  None  Thank you for allowing pharmacy to be a part of this patient's care.  Peggyann Juba, PharmD, BCPS Pharmacy: [phone] 02/10/2022, 10:29 AM

## 2022-02-10 NOTE — Discharge Summary (Signed)
Physician Discharge Summary  Katie Alvarado:[id number] DOB: Jul 07, 1925 DOA: 02/06/2022  PCP: [name], MD  Admit date: 02/06/2022 Discharge date: 02/10/2022 Recommendations for Outpatient Follow-up:  Follow up with PCP in 1 weeks-call for appointment Please obtain BMP/CBC in one week  Discharge Dispo: Home Health Discharge Condition: Stable Code Status:   Code Status: DNR Diet recommendation:  Diet Order             Diet heart healthy/carb modified Room service appropriate? Yes; Fluid consistency: Thin  Diet effective now                    Brief/Interim Summary: 88 y.o. female with medical history significant for COPD, bronchiectasis, history of Mycobacterium Avium-intracellulare complex(from positive sputum culture on 06/09/2012), type 2 diabetes, hypertension, GERD, HFpEF, history of multifocal mucous plugging at the lung bases (08/30/21), who presented to [facility] ED due to worsening shortness of breath.  Associated with cough and wheezing.  Onset of symptoms about a week ago, acutely worsened today.  EMS was activated.  Upon EMS arrival she received DuoNebs treatment, IV Solu-Medrol, in route.   In the ED, COVID-19 screening test negative.  No evidence of pneumonia on chest x-ray.  Initially placed on BiPAP, with improvement after treatment with, nebulizer, IV magnesium, and IV steroids via EMS.  She was taken off the BiPAP and placed on 3 L [place] with O2 saturation in the high 90s.  EDP requested admission for further management. patient was admitted by [facility], hospitalist service.  ED Course: Tmax 99.1.  BP 111/53, pulse 59, respiratory rate 15, O2 saturation 97% on 3 L.  Lab studies remarkable for serum potassium 3.4, glucose 214, BUN 32, creatinine 1.10.  GFR 47.  Baseline creatinine 0.8 with GFR greater than 60.  BNP 154.Hemoglobin 10.7 at baseline.  WBC 7.3.  Platelet count 220. Managed with systemic steroids,IV antibiotics antitussives supplemental oxygen Patient overall  clinically improved.At this time patient is not hypoxic not short of breath ambulated well without oxygen and not short of breath.She feels well and is trying to go home, has declined to go to skilled nursing facility, will arrange home health services as OP.  Discharge Diagnoses:  Principal Problem:   COPD with acute exacerbation ([place]) Active Problems:   Acute respiratory failure with hypoxia (H[facility])  Acute COPD exacerbation Mild to moderate bronchiectasis exacerbation: Acute hypoxic respiratory failure: History of bronchiectasis, most recent sputum culture revealed normal respiratory flora no staph or Pseudomonas.sputum culture ordered -but not done as no sputum for culture yet.  Clinically much improved, treated with Unasyn, will discharge on oral Augmentin short course of low-dose steroid bronchodilators.  Continue home health services.  Declined to go to SNF.  Doing well on room air,   T2DM with uncontrolled hyperglycemia: Patient will continue home regimen.HBA1c stable 6.8 Anxiety/depression: Stable,not on meds. Hypokalemia resolved. GERD continue PPI. Hypertension: BP well controlled continue her home hydralazine, torsemide and metoprolol.  Last echo from 2021 reviewed EF 60 to 65%, G1 DD Normocytic anemia: 9 to 10 g.  Monitor suspect due to chronic disease. CKD stage IIIa creatinine stable at baseline.  Outpatient follow-up advised  Physical debility/deconditioning PT OT eval done mobilizing well, does not want to go to SNF as advised. Has a caregiver at home.  We will set up home health.  Goals of care: I discussed with regarding her CODE STATUS patient confirmed that she is DNR do not want intubation or CPR.RT at bedside and witnessed the discussion  Consults: PT OT Subjective: Alert awake resting comfortably hopeful for home today Discharge Exam: Vitals:   02/10/22 1047 02/10/22 1144  BP:  115/64  Pulse: 71 65  Resp:  18  Temp:    SpO2: 93% 97%   General: Pt is alert,  awake, not in acute distress Cardiovascular: RRR, S1/S2 +, no rubs, no gallops Respiratory: CTA bilaterally, no wheezing, no rhonchi Abdominal: Soft, NT, ND, bowel sounds + Extremities: no edema, no cyanosis  Discharge Instructions  Discharge Instructions     Discharge instructions   Complete by: As directed    Please call call MD or return to ER for similar or worsening recurring problem that brought you to hospital or if any fever,nausea/vomiting,abdominal pain, uncontrolled pain, chest pain,  shortness of breath or any other alarming symptoms.  Please follow-up your doctor as instructed in a week time and call the office for appointment.  Please avoid alcohol, smoking, or any other illicit substance and maintain healthy habits including taking your regular medications as prescribed.  You were cared for by a hospitalist during your hospital stay. If you have any questions about your discharge medications or the care you received while you were in the hospital after you are discharged, you can call the unit and ask to speak with the hospitalist on call if the hospitalist that took care of you is not available.  Once you are discharged, your primary care physician will handle any further medical issues. Please note that NO REFILLS for any discharge medications will be authorized once you are discharged, as it is imperative that you return to your primary care physician (or establish a relationship with a primary care physician if you do not have one) for your aftercare needs so that they can reassess your need for medications and monitor your lab values   Discharge instructions   Complete by: As directed    Please call call MD or return to ER for similar or worsening recurring problem that brought you to hospital or if any fever,nausea/vomiting,abdominal pain, uncontrolled pain, chest pain,  shortness of breath or any other alarming symptoms.  Please follow-up your doctor as instructed in a  week time and call the office for appointment.  Please avoid alcohol, smoking, or any other illicit substance and maintain healthy habits including taking your regular medications as prescribed.  You were cared for by a hospitalist during your hospital stay. If you have any questions about your discharge medications or the care you received while you were in the hospital after you are discharged, you can call the unit and ask to speak with the hospitalist on call if the hospitalist that took care of you is not available.  Once you are discharged, your primary care physician will handle any further medical issues. Please note that NO REFILLS for any discharge medications will be authorized once you are discharged, as it is imperative that you return to your primary care physician (or establish a relationship with a primary care physician if you do not have one) for your aftercare needs so that they can reassess your need for medications and monitor your lab values   Increase activity slowly   Complete by: As directed    Increase activity slowly   Complete by: As directed       Allergies as of 02/10/2022       Reactions   Welchol [colesevelam Hcl] Hives   Amlodipine    Ankle swelling   Atorvastatin    Other  reaction(s): foot pain   Coreg [carvedilol]    dizzy   Doxycycline    rash   Glucosamine Itching, Other (See Comments)   "Breakouts"   Hydrocodone-acetaminophen    Other reaction(s): did not feel right   Mobic [meloxicam] Other (See Comments)   Unknown reaction   Sulfa Antibiotics    itching   Telmisartan    itching   Tramadol Nausea Only   *can tolerate 1/2 dose" per husband at bedside   Celecoxib Hives, Itching, Rash   Statins Rash        Medication List     TAKE these medications    acetaminophen 325 MG tablet Commonly known as: TYLENOL Take 325 mg by mouth every 6 (six) hours as needed for mild pain.   albuterol 108 (90 Base) MCG/ACT inhaler Commonly known  as: VENTOLIN HFA INHALE 2 PUFFS INTO THE LUNGS EVERY 6 HOURS AS NEEDED FOR WHEEZING OR SHORTNESS OF BREATH   ALPRAZolam 0.5 MG tablet Commonly known as: XANAX Take 0.5 mg by mouth 2 (two) times daily as needed for anxiety.   amoxicillin-clavulanate 875-125 MG tablet Commonly known as: AUGMENTIN Take 1 tablet by mouth 2 (two) times daily for 5 days.   aspirin EC 81 MG tablet Take 81 mg by mouth daily. Swallow whole.   budesonide 0.25 MG/2ML nebulizer solution Commonly known as: PULMICORT USE 1 VIAL  IN  NEBULIZER TWICE  DAILY - rinse mouth after treatment What changed: See the new instructions.   carboxymethylcellulose 0.5 % Soln Commonly known as: REFRESH PLUS Place 1 drop into both eyes as needed (dry eyes).   cholecalciferol 1000 units tablet Commonly known as: VITAMIN D Take 1,000 Units by mouth daily.   esomeprazole 40 MG capsule Commonly known as: NEXIUM Take 40 mg by mouth daily.   fluticasone 50 MCG/ACT nasal spray Commonly known as: FLONASE Place 1 spray into both nostrils daily.   glipiZIDE 5 MG tablet Commonly known as: GLUCOTROL Take 5 mg by mouth every morning.   guaiFENesin-dextromethorphan 100-10 MG/5ML syrup Commonly known as: ROBITUSSIN DM Take 5 mLs by mouth every 4 (four) hours as needed for up to 5 days for cough.   hydrALAZINE 50 MG tablet Commonly known as: APRESOLINE Take 50 mg by mouth 3 (three) times daily.   ipratropium 0.02 % nebulizer solution Commonly known as: ATROVENT USE 1 VIAL IN NEBULIZER 4 TIMES DAILY What changed: See the new instructions.   irbesartan 300 MG tablet Commonly known as: AVAPRO TAKE 1 TABLET BY MOUTH EVERY DAY   menthol-cetylpyridinium 3 MG lozenge Commonly known as: CEPACOL Take 1 lozenge (3 mg total) by mouth as needed for sore throat.   metoprolol tartrate 50 MG tablet Commonly known as: LOPRESSOR Take 1 tablet (50 mg total) by mouth 2 (two) times daily. What changed:  when to take this additional  instructions   predniSONE 20 MG tablet Commonly known as: DELTASONE Take 1 tablet (20 mg total) by mouth daily with breakfast for 4 days.   PRESERVISION AREDS 2 PO Take 1 tablet by mouth in the morning and at bedtime.   torsemide 20 MG tablet Commonly known as: DEMADEX Take 20 mg by mouth daily.   traMADol 50 MG tablet Commonly known as: ULTRAM Take 50 mg by mouth 2 (two) times daily as needed for moderate pain or severe pain.   vitamin E 180 MG (400 UNITS) capsule Take 400 Units by mouth 2 (two) times a week.  Follow-up Information     [name], MD Follow up in 1 week(s).   Specialty: Family Medicine Contact information: [place] [address] [phone]                Allergies  Allergen Reactions   Welchol [Colesevelam Hcl] Hives   Amlodipine     Ankle swelling   Atorvastatin     Other reaction(s): foot pain   Coreg [Carvedilol]     dizzy   Doxycycline     rash   Glucosamine Itching and Other (See Comments)    "Breakouts"   Hydrocodone-Acetaminophen     Other reaction(s): did not feel right   Mobic [Meloxicam] Other (See Comments)    Unknown reaction   Sulfa Antibiotics     itching   Telmisartan     itching   Tramadol Nausea Only    *can tolerate 1/2 dose" per husband at bedside   Celecoxib Hives, Itching and Rash   Statins Rash    The results of significant diagnostics from this hospitalization (including imaging, microbiology, ancillary and laboratory) are listed below for reference.    Microbiology: Recent Results (from the past 240 hour(s))  Resp Panel by RT-PCR (Flu A&B, Covid) Anterior Nasal Swab     Status: None   Collection Time: 02/06/22  7:35 PM   Specimen: Anterior Nasal Swab  Result Value Ref Range Status   SARS Coronavirus 2 by RT PCR NEGATIVE NEGATIVE Final    Comment: (NOTE) SARS-CoV-2 target nucleic acids are NOT DETECTED.  The SARS-CoV-2 RNA is generally detectable in upper  respiratory specimens during the acute phase of infection. The lowest concentration of SARS-CoV-2 viral copies this assay can detect is 138 copies/mL. A negative result does not preclude SARS-Cov-2 infection and should not be used as the sole basis for treatment or other patient management decisions. A negative result may occur with  improper specimen collection/handling, submission of specimen other than nasopharyngeal swab, presence of viral mutation(s) within the areas targeted by this assay, and inadequate number of viral copies(<138 copies/mL). A negative result must be combined with clinical observations, patient history, and epidemiological information. The expected result is Negative.  Fact Sheet for Patients:  EntrepreneurPulse.com.au  Fact Sheet for Healthcare Providers:  IncredibleEmployment.be  This test is no t yet approved or cleared by the [place] FDA and  has been authorized for detection and/or diagnosis of SARS-CoV-2 by FDA under an Emergency Use Authorization (EUA). This EUA will remain  in effect (meaning this test can be used) for the duration of the COVID-19 declaration under Section 564(b)(1) of the Act, 21 U.S.C.section 360bbb-3(b)(1), unless the authorization is terminated  or revoked sooner.       Influenza A by PCR NEGATIVE NEGATIVE Final   Influenza B by PCR NEGATIVE NEGATIVE Final    Comment: (NOTE) The Xpert Xpress SARS-CoV-2/FLU/RSV plus assay is intended as an aid in the diagnosis of influenza from Nasopharyngeal swab specimens and should not be used as a sole basis for treatment. Nasal washings and aspirates are unacceptable for Xpert Xpress SARS-CoV-2/FLU/RSV testing.  Fact Sheet for Patients: EntrepreneurPulse.com.au  Fact Sheet for Healthcare Providers: IncredibleEmployment.be  This test is not yet approved or cleared by the [place] FDA and has been  authorized for detection and/or diagnosis of SARS-CoV-2 by FDA under an Emergency Use Authorization (EUA). This EUA will remain in effect (meaning this test can be used) for the duration of the COVID-19 declaration under Section 564(b)(1)  of the Act, 21 U.S.C. section 360bbb-3(b)(1), unless the authorization is terminated or revoked.  Performed at [facility], [place] [address] [place], [place] [address]     Procedures/Studies: DG Chest Port 1 View  Result Date: 02/06/2022 CLINICAL DATA:  Shortness of breath. EXAM: PORTABLE CHEST 1 VIEW COMPARISON:  Radiograph 08/30/2021. CT 01/02/2021 FINDINGS: Stable heart size and mediastinal contours. Aortic atherosclerosis. Chronic left lung volume loss. Chronic right upper lobe and right mid lung opacity corresponds to bronchiectasis and mucoid impaction on prior CT. The chronic left basilar opacities are better appreciated on prior CT with only limited radiographic correlate. No acute airspace disease. No pleural effusion. Chronic left shoulder arthropathy. IMPRESSION: 1. No acute findings. 2. Chronic right upper lobe and right mid lung opacity corresponds to bronchiectasis and mucoid impaction on prior CT. Electronically Signed   By: Keith Rake M.D.   On: 02/06/2022 20:02    Labs: BNP (last 3 results) Recent Labs    08/29/21 2150 02/06/22 1935  BNP 126.0* 948.5*   Basic Metabolic Panel: Recent Labs  Lab 02/06/22 1935 02/07/22 0530 02/08/22 0527 02/09/22 0618 02/10/22 0547  NA 135 135 137 138 136  K 3.4* 3.7 3.6 3.7 4.1  CL 94* 95* 100 98 95*  CO2 '29 27 29 31 '$ 32  GLUCOSE 214* 238* 138* 89 135*  BUN 32* 36* 38* 40* 39*  CREATININE 1.10* 1.20* 1.07* 1.05* 1.13*  CALCIUM 8.7* 8.9 8.7* 8.8* 8.7*  MG  --  2.3  --   --   --   PHOS  --  4.3  --   --   --    Liver Function Tests: Recent Labs  Lab 02/07/22 0530  ALBUMIN 3.4*   No results for input(s): "LIPASE", "AMYLASE" in the last 168 hours. No results for  input(s): "AMMONIA" in the last 168 hours. CBC: Recent Labs  Lab 02/06/22 1935 02/07/22 0530 02/08/22 0527 02/09/22 0618 02/10/22 0547  WBC 7.3 4.6 10.3 7.2 6.2  NEUTROABS 5.1  --   --   --   --   HGB 10.7* 10.3* 9.8* 10.0* 10.1*  HCT 32.8* 32.0* 30.7* 31.2* 31.8*  MCV 88.2 88.4 89.8 88.9 89.3  PLT 220 213 237 265 231   Cardiac Enzymes: No results for input(s): "CKTOTAL", "CKMB", "CKMBINDEX", "TROPONINI" in the last 168 hours. BNP: Invalid input(s): "POCBNP" CBG: Recent Labs  Lab 02/09/22 1152 02/09/22 1654 02/09/22 2118 02/10/22 0726 02/10/22 1143  GLUCAP 130* 305* 178* 106* 199*   D-Dimer No results for input(s): "DDIMER" in the last 72 hours. Hgb A1c No results for input(s): "HGBA1C" in the last 72 hours. Lipid Profile No results for input(s): "CHOL", "HDL", "LDLCALC", "TRIG", "CHOLHDL", "LDLDIRECT" in the last 72 hours. Thyroid function studies No results for input(s): "TSH", "T4TOTAL", "T3FREE", "THYROIDAB" in the last 72 hours.  Invalid input(s): "FREET3" Anemia work up No results for input(s): "VITAMINB12", "FOLATE", "FERRITIN", "TIBC", "IRON", "RETICCTPCT" in the last 72 hours. Urinalysis    Component Value Date/Time   COLORURINE STRAW (A) 08/29/2021 0002   APPEARANCEUR CLEAR 08/29/2021 0002   LABSPEC 1.008 08/29/2021 0002   PHURINE 7.0 08/29/2021 0002   GLUCOSEU NEGATIVE 08/29/2021 0002   HGBUR NEGATIVE 08/29/2021 0002   BILIRUBINUR NEGATIVE 08/29/2021 0002   KETONESUR NEGATIVE 08/29/2021 0002   PROTEINUR NEGATIVE 08/29/2021 0002   UROBILINOGEN 0.2 12/19/2018 1155   NITRITE NEGATIVE 08/29/2021 0002   LEUKOCYTESUR NEGATIVE 08/29/2021 0002   Sepsis Labs Recent Labs  Lab 02/07/22 0530 02/08/22 0527  02/09/22 0618 02/10/22 0547  WBC 4.6 10.3 7.2 6.2   Microbiology Recent Results (from the past 240 hour(s))  Resp Panel by RT-PCR (Flu A&B, Covid) Anterior Nasal Swab     Status: None   Collection Time: 02/06/22  7:35 PM   Specimen: Anterior  Nasal Swab  Result Value Ref Range Status   SARS Coronavirus 2 by RT PCR NEGATIVE NEGATIVE Final    Comment: (NOTE) SARS-CoV-2 target nucleic acids are NOT DETECTED.  The SARS-CoV-2 RNA is generally detectable in upper respiratory specimens during the acute phase of infection. The lowest concentration of SARS-CoV-2 viral copies this assay can detect is 138 copies/mL. A negative result does not preclude SARS-Cov-2 infection and should not be used as the sole basis for treatment or other patient management decisions. A negative result may occur with  improper specimen collection/handling, submission of specimen other than nasopharyngeal swab, presence of viral mutation(s) within the areas targeted by this assay, and inadequate number of viral copies(<138 copies/mL). A negative result must be combined with clinical observations, patient history, and epidemiological information. The expected result is Negative.  Fact Sheet for Patients:  EntrepreneurPulse.com.au  Fact Sheet for Healthcare Providers:  IncredibleEmployment.be  This test is no t yet approved or cleared by the [place] FDA and  has been authorized for detection and/or diagnosis of SARS-CoV-2 by FDA under an Emergency Use Authorization (EUA). This EUA will remain  in effect (meaning this test can be used) for the duration of the COVID-19 declaration under Section 564(b)(1) of the Act, 21 U.S.C.section 360bbb-3(b)(1), unless the authorization is terminated  or revoked sooner.       Influenza A by PCR NEGATIVE NEGATIVE Final   Influenza B by PCR NEGATIVE NEGATIVE Final    Comment: (NOTE) The Xpert Xpress SARS-CoV-2/FLU/RSV plus assay is intended as an aid in the diagnosis of influenza from Nasopharyngeal swab specimens and should not be used as a sole basis for treatment. Nasal washings and aspirates are unacceptable for Xpert Xpress SARS-CoV-2/FLU/RSV testing.  Fact Sheet for  Patients: EntrepreneurPulse.com.au  Fact Sheet for Healthcare Providers: IncredibleEmployment.be  This test is not yet approved or cleared by the [place] FDA and has been authorized for detection and/or diagnosis of SARS-CoV-2 by FDA under an Emergency Use Authorization (EUA). This EUA will remain in effect (meaning this test can be used) for the duration of the COVID-19 declaration under Section 564(b)(1) of the Act, 21 U.S.C. section 360bbb-3(b)(1), unless the authorization is terminated or revoked.  Performed at [facility], [place] [address], [place], [place] [address]   Time coordinating discharge: 25 minutes  SIGNED: Antonieta Pert, MD  [facility] 02/10/2022, 1:35 PM  If 7PM-7AM, please contact night-coverage [email or web address]

## 2022-02-10 NOTE — Progress Notes (Signed)
Respiratory therapist found patient somewhat hypoxic. Needing to be put back on [place] at 2l. Discussed with the patient we will discharge her today monitor overnight at least 1 more day, may need to go home with oxygen.

## 2022-02-11 DIAGNOSIS — J441 Chronic obstructive pulmonary disease with (acute) exacerbation: Secondary | ICD-10-CM | POA: Diagnosis not present

## 2022-02-11 LAB — CBC
HCT: 31.8 % — ABNORMAL LOW (ref 36.0–46.0)
Hemoglobin: 10.1 g/dL — ABNORMAL LOW (ref 12.0–15.0)
MCH: 28.1 pg (ref 26.0–34.0)
MCHC: 31.8 g/dL (ref 30.0–36.0)
MCV: 88.6 fL (ref 80.0–100.0)
Platelets: 249 10*3/uL (ref 150–400)
RBC: 3.59 MIL/uL — ABNORMAL LOW (ref 3.87–5.11)
RDW: 13.8 % (ref 11.5–15.5)
WBC: 6.7 10*3/uL (ref 4.0–10.5)
nRBC: 0 % (ref 0.0–0.2)

## 2022-02-11 LAB — BASIC METABOLIC PANEL
Anion gap: 9 (ref 5–15)
BUN: 38 mg/dL — ABNORMAL HIGH (ref 8–23)
CO2: 35 mmol/L — ABNORMAL HIGH (ref 22–32)
Calcium: 8.9 mg/dL (ref 8.9–10.3)
Chloride: 93 mmol/L — ABNORMAL LOW (ref 98–111)
Creatinine, Ser: 0.96 mg/dL (ref 0.44–1.00)
GFR, Estimated: 55 mL/min — ABNORMAL LOW (ref >60)
Glucose, Bld: 105 mg/dL — ABNORMAL HIGH (ref 70–99)
Potassium: 3.4 mmol/L — ABNORMAL LOW (ref 3.5–5.1)
Sodium: 137 mmol/L (ref 135–145)

## 2022-02-11 LAB — GLUCOSE, CAPILLARY
Glucose-Capillary: 145 mg/dL — ABNORMAL HIGH (ref 70–99)
Glucose-Capillary: 132 mg/dL — ABNORMAL HIGH (ref 70–99)
Glucose-Capillary: 323 mg/dL — ABNORMAL HIGH (ref 70–99)
Glucose-Capillary: 99 mg/dL (ref 70–99)

## 2022-02-11 MED ORDER — IPRATROPIUM-ALBUTEROL 0.5-2.5 (3) MG/3ML IN SOLN
3.0000 mL | Freq: Two times a day (BID) | RESPIRATORY_TRACT | Status: DC
  Administered 2022-02-11 – 2022-02-18 (×13): 3 mL via RESPIRATORY_TRACT
  Filled 2022-02-11 (×14): qty 3

## 2022-02-11 MED ORDER — PREDNISONE 20 MG PO TABS
20.0000 mg | ORAL_TABLET | Freq: Every day | ORAL | Status: DC
  Administered 2022-02-12 – 2022-02-18 (×7): 20 mg via ORAL
  Filled 2022-02-11 (×7): qty 1

## 2022-02-11 NOTE — Progress Notes (Signed)
PROGRESS NOTE Katie Alvarado  RCV:[id number] DOB: 10/16/1925 DOA: 02/06/2022 PCP: [name], MD   Brief Narrative/Hospital Course: 88 y.o. female with medical history significant for COPD, bronchiectasis, history of Mycobacterium Avium-intracellulare complex(from positive sputum culture on 06/09/2012), type 2 diabetes, hypertension, GERD, HFpEF, history of multifocal mucous plugging at the lung bases (08/30/21), who presented to [facility] ED due to worsening shortness of breath.  Associated with cough and wheezing.  Onset of symptoms about a week ago, acutely worsened today.  EMS was activated.  Upon EMS arrival she received DuoNebs treatment, IV Solu-Medrol, in route.   In the ED, COVID-19 screening test negative.  No evidence of pneumonia on chest x-ray.  Initially placed on BiPAP, with improvement after treatment with, nebulizer, IV magnesium, and IV steroids via EMS.  She was taken off the BiPAP and placed on 3 L [place] with O2 saturation in the high 90s.  EDP requested admission for further management. patient was admitted by [facility], hospitalist service.  ED Course: Tmax 99.1.  BP 111/53, pulse 59, respiratory rate 15, O2 saturation 97% on 3 L.  Lab studies remarkable for serum potassium 3.4, glucose 214, BUN 32, creatinine 1.10.  GFR 47.  Baseline creatinine 0.8 with GFR greater than 60.  BNP 154.Hemoglobin 10.7 at baseline.  WBC 7.3.  Platelet count 220. Managed with systemic steroids,IV antibiotics antitussives supplemental oxygen     Subjective:  Still having cough, able to be weaned off to room air this morning Overall feeling better, afebrile no nausea vomiting or no chest pain.  Assessment and Plan: Principal Problem:   COPD with acute exacerbation ([place]) Active Problems:   Acute respiratory failure with hypoxia (HCC)   Acute COPD exacerbation Mild to moderate bronchiectasis exacerbation: Acute hypoxic respiratory failure: History of bronchiectasis, most recent sputum culture revealed  normal respiratory flora no staph or Pseudomonas.sputum culture ordered -but not done as no sputum for culture yet.  Clinically much improved, continue with empiric Unasyn, oral prednisone> tapering to 30 mg> 20 mg, continue, bronchodilators antitussives, saline nebulizer.  Continue PT OT, wean oxygen to room air and ambulate without oxygen, hopefully she will remain off oxygen for next 24 send can go home  T2DM with uncontrolled hyperglycemia: Blood sugars stable continue her Glucotrol, sliding scale and monitor as below. HBA1c stable 6.8 Recent Labs  Lab 02/07/22 0530 02/07/22 0910 02/10/22 1143 02/10/22 1653 02/10/22 2028 02/11/22 0805 02/11/22 1141  GLUCAP  --    < > 199* 293* 185* 145* 132*  HGBA1C 6.8*  --   --   --   --   --   --    < > = values in this interval not displayed.  Anxiety/depression: Mood stable.   Hypokalemia resolved. GERD continue PPI. Hypertension: BP fairly controlled, continue  PTA hydralazine, torsemide and metoprolol.  Last echo from 2021 reviewed EF 60 to 65%, G1 DD Normocytic anemia: 9-10g.Monitor  CKD stage IIIa creatinine holding stable. Monitor. Recent Labs  Lab 02/07/22 0530 02/08/22 0527 02/09/22 0618 02/10/22 0547 02/11/22 0453  BUN 36* 38* 40* 39* 38*  CREATININE 1.20* 1.07* 1.05* 1.13* 0.96    Physical debility/deconditioning: cont PT OT , cont to mobilize- does not want to go to SNF as advised.  Has a caregiver at home.  We will set up home health.   Goals of care: I discussed with regarding her CODE STATUS patient confirmed that she is DNR do not want intubation or CPR. DVT prophylaxis: enoxaparin (LOVENOX) injection 30 mg Start:  02/07/22 1000 Code Status:   Code Status: DNR Family Communication: plan of care discussed with patient at bedside.  Patient status is: Inpatient due to ongoing acute hypoxic respiratory failure, COPD exacerbation  Updated her granddaughter previously.  Level of care: Telemetry  Dispo: The patient is from:  home lives alone            Anticipated disposition: Home with home health tomorrow if remains off oxygen today-declined to go to SNF.    Objective: Vitals last 24 hrs: Vitals:   02/10/22 2023 02/10/22 2025 02/11/22 0502 02/11/22 0836  BP:  (!) 161/75 (!) 145/61   Pulse:  66 (!) 59   Resp:  (!) 22    Temp:  98 F (36.7 C) 97.7 F (36.5 C)   TempSrc:  Axillary Oral   SpO2: 96% 100% 98% 94%  Weight:       Weight change:   Physical Examination: General exam: alert awake pleasant not in distress on oxygen this morning, older than stated age HEENT:Oral mucosa moist, Ear/Nose WNL grossly Respiratory system: Bilaterally diminished BS, minimal wheezing, no use of accessory muscle Cardiovascular system: S1 & S2 +, No JVD. Gastrointestinal system: Abdomen soft,NT,ND, BS+ Nervous System: Alert, awake, moving extremities, she follows commands. Extremities: LE edema neg,distal peripheral pulses palpable.  Skin: No rashes,no icterus. MSK: Normal muscle bulk,tone, power   Medications reviewed:  Scheduled Meds:  aspirin EC  81 mg Oral Daily   budesonide  0.25 mg Nebulization BID   enoxaparin (LOVENOX) injection  30 mg Subcutaneous Q24H   fluticasone  1 spray Each Nare Daily   glipiZIDE  5 mg Oral QAC breakfast   hydrALAZINE  50 mg Oral TID   insulin aspart  0-5 Units Subcutaneous QHS   insulin aspart  0-9 Units Subcutaneous TID WC   ipratropium-albuterol  3 mL Nebulization BID   metoprolol tartrate  25 mg Oral QHS   metoprolol tartrate  50 mg Oral BID   pantoprazole  40 mg Oral Daily   predniSONE  30 mg Oral Q breakfast   torsemide  20 mg Oral Daily   vitamin E  400 Units Oral Once per day on Mon Thu   Continuous Infusions:  ampicillin-sulbactam (UNASYN) IV 200 mL/hr at 02/11/22 1213      Diet Order             Diet heart healthy/carb modified Room service appropriate? Yes; Fluid consistency: Thin  Diet effective now                  Intake/Output Summary (Last 24  hours) at 02/11/2022 1306 Last data filed at 02/11/2022 1213 Gross per 24 hour  Intake 1039.88 ml  Output 3 ml  Net 1036.88 ml   Net IO Since Admission: -1,597.91 mL [02/11/22 1306]  Wt Readings from Last 3 Encounters:  02/07/22 54 kg  09/27/21 54.4 kg  09/01/21 55.6 kg     Unresulted Labs (From admission, onward)     Start     Ordered   02/13/22 0500  Creatinine, serum  (enoxaparin (LOVENOX)    CrCl >/= 30 ml/min)  Weekly,   R     Comments: while on enoxaparin therapy    02/06/22 2258   02/08/22 0762  Basic metabolic panel  Daily at 5am,   R      02/07/22 1741   02/08/22 0500  CBC  Daily at 5am,   R      02/07/22 1741   02/07/22 0229  Expectorated Sputum Assessment w Gram Stain, Rflx to Resp Cult  Once,   R       Question:  Patient immune status  Answer:  Normal   02/07/22 0228          Data Reviewed: I have personally reviewed following labs and imaging studies CBC: Recent Labs  Lab 02/06/22 1935 02/07/22 0530 02/08/22 0527 02/09/22 0618 02/10/22 0547 02/11/22 0453  WBC 7.3 4.6 10.3 7.2 6.2 6.7  NEUTROABS 5.1  --   --   --   --   --   HGB 10.7* 10.3* 9.8* 10.0* 10.1* 10.1*  HCT 32.8* 32.0* 30.7* 31.2* 31.8* 31.8*  MCV 88.2 88.4 89.8 88.9 89.3 88.6  PLT 220 213 237 265 231 532   Basic Metabolic Panel: Recent Labs  Lab 02/07/22 0530 02/08/22 0527 02/09/22 0618 02/10/22 0547 02/11/22 0453  NA 135 137 138 136 137  K 3.7 3.6 3.7 4.1 3.4*  CL 95* 100 98 95* 93*  CO2 '27 29 31 '$ 32 35*  GLUCOSE 238* 138* 89 135* 105*  BUN 36* 38* 40* 39* 38*  CREATININE 1.20* 1.07* 1.05* 1.13* 0.96  CALCIUM 8.9 8.7* 8.8* 8.7* 8.9  MG 2.3  --   --   --   --   PHOS 4.3  --   --   --   --    GFR: Estimated Creatinine Clearance: 30.5 mL/min (by C-G formula based on SCr of 0.96 mg/dL). Liver Function Tests: Recent Labs  Lab 02/07/22 0530  ALBUMIN 3.4*  CBG: Recent Labs  Lab 02/10/22 1143 02/10/22 1653 02/10/22 2028 02/11/22 0805 02/11/22 1141  GLUCAP 199* 293*  185* 145* 132*   Recent Results (from the past 240 hour(s))  Resp Panel by RT-PCR (Flu A&B, Covid) Anterior Nasal Swab     Status: None   Collection Time: 02/06/22  7:35 PM   Specimen: Anterior Nasal Swab  Result Value Ref Range Status   SARS Coronavirus 2 by RT PCR NEGATIVE NEGATIVE Final    Comment: (NOTE) SARS-CoV-2 target nucleic acids are NOT DETECTED.  The SARS-CoV-2 RNA is generally detectable in upper respiratory specimens during the acute phase of infection. The lowest concentration of SARS-CoV-2 viral copies this assay can detect is 138 copies/mL. A negative result does not preclude SARS-Cov-2 infection and should not be used as the sole basis for treatment or other patient management decisions. A negative result may occur with  improper specimen collection/handling, submission of specimen other than nasopharyngeal swab, presence of viral mutation(s) within the areas targeted by this assay, and inadequate number of viral copies(<138 copies/mL). A negative result must be combined with clinical observations, patient history, and epidemiological information. The expected result is Negative.  Fact Sheet for Patients:  EntrepreneurPulse.com.au  Fact Sheet for Healthcare Providers:  IncredibleEmployment.be  This test is no t yet approved or cleared by the [place] FDA and  has been authorized for detection and/or diagnosis of SARS-CoV-2 by FDA under an Emergency Use Authorization (EUA). This EUA will remain  in effect (meaning this test can be used) for the duration of the COVID-19 declaration under Section 564(b)(1) of the Act, 21 U.S.C.section 360bbb-3(b)(1), unless the authorization is terminated  or revoked sooner.       Influenza A by PCR NEGATIVE NEGATIVE Final   Influenza B by PCR NEGATIVE NEGATIVE Final    Comment: (NOTE) The Xpert Xpress SARS-CoV-2/FLU/RSV plus assay is intended as an aid in the diagnosis of influenza  from Nasopharyngeal swab specimens  and should not be used as a sole basis for treatment. Nasal washings and aspirates are unacceptable for Xpert Xpress SARS-CoV-2/FLU/RSV testing.  Fact Sheet for Patients: EntrepreneurPulse.com.au  Fact Sheet for Healthcare Providers: IncredibleEmployment.be  This test is not yet approved or cleared by the [place] FDA and has been authorized for detection and/or diagnosis of SARS-CoV-2 by FDA under an Emergency Use Authorization (EUA). This EUA will remain in effect (meaning this test can be used) for the duration of the COVID-19 declaration under Section 564(b)(1) of the Act, 21 U.S.C. section 360bbb-3(b)(1), unless the authorization is terminated or revoked.  Performed at [facility], [place] [address] [place], [place] [address]     Antimicrobials: Anti-infectives (From admission, onward)    Start     Dose/Rate Route Frequency Ordered Stop   02/09/22 0000  amoxicillin-clavulanate (AUGMENTIN) 875-125 MG tablet        1 tablet Oral 2 times daily 02/09/22 1235 02/14/22 2359   02/07/22 0300  Ampicillin-Sulbactam (UNASYN) 3 g in sodium chloride 0.9 % 100 mL IVPB        3 g 200 mL/hr over 30 Minutes Intravenous Every 12 hours 02/07/22 0229        Culture/Microbiology    Component Value Date/Time   SDES  08/29/2021 0002    URINE, CLEAN CATCH Performed at [facility], [place] [address] [place], [place] [address]    SPECREQUEST  08/29/2021 0002    NONE Performed at [facility], [place] [address] [place], [place] [address]    CULT  08/29/2021 0002    NO GROWTH Performed at [place] [address] [place],  [address]    REPTSTATUS 08/31/2021 FINAL 08/29/2021 0002    Radiology Studies: No results found.   LOS: 4 days   Antonieta Pert, MD [facility]  02/11/2022, 1:06 PM

## 2022-02-11 NOTE — Progress Notes (Signed)
Physical Therapy Treatment Patient Details Name: Katie Alvarado MRN: [id number] DOB: August 11, 1925 Today's Date: 02/11/2022   History of Present Illness  Katie Alvarado is a 88 y.o. female with medical history significant for COPD, bronchiectasis, history of Mycobacterium Avium-intracellulare complex, type 2 diabetes, hypertension, GERD, HFpEF, history of multifocal mucous plugging at the lung bases who presented to [facility] ED due to worsening shortness of breath.  Associated with cough and wheezing.    PT Comments    Patient making good progress with mobility and required min guard/assist for bed mobility and transfer with RW today. Pt required Min assist during gait to manage walker position intermittently but was able to ambulate ~120' today with no rest break required. She reports she would like to go home with assist from friends and will see if she can get her caregiver/friend to assist more often. Will continue to progress pt as able.    Recommendations for follow up therapy are one component of a multi-disciplinary discharge planning process, led by the attending physician.  Recommendations may be updated based on patient status, additional functional criteria and insurance authorization.  PT Recommendation   Follow Up Recommendations Skilled nursing-short term rehab (<3 hours/day) Filed 02/11/2022 0954  Can patient physically be transported by private vehicle Yes Filed 02/11/2022 0954  Assistance recommended at discharge Frequent or constant Supervision/Assistance Filed 02/11/2022 0954  Patient can return home with the following A little help with walking and/or transfers, A little help with bathing/dressing/bathroom, Assistance with cooking/housework, Assist for transportation, Help with stairs or ramp for entrance [facility] 02/11/2022 0954  PT equipment None recommended by PT [facility] 02/11/2022 0954   Mobility  Bed Mobility Overal bed mobility: Needs Assistance Bed Mobility: Supine to Sit,  Sit to Supine     Supine to sit: Min assist, HOB elevated Sit to supine: Min guard, HOB elevated   General bed mobility comments: Min assist to press trunk up to sit due to Lt shoulder pain. pt able to bring LE's off EOB with guarding for safety. extra time and no assist to return to supine.    Transfers Overall transfer level: Needs assistance Equipment used: Rolling walker (2 wheels) Transfers: Sit to/from Stand Sit to Stand: Min assist, Min guard           General transfer comment: pt using Rt UE on RW to rise from EOB and on grab bar to power up from toilet. min guard from firm surface, light/min assist from soft surfaces.    Ambulation/Gait Ambulation/Gait assistance: Min assist, Min guard Gait Distance (Feet): 120 Feet Assistive device: Rolling walker (2 wheels) Gait Pattern/deviations: Step-through pattern, Decreased step length - right, Decreased step length - left, Decreased stride length, Shuffle, Trunk flexed Gait velocity: decr     General Gait Details: cues for safe step pattern and proximity to RW. pt ambulate in hallway with with intermittent assist to keep walker in safe position, knees slightly flexed but no buckling noted. pt ambulated short bout to bathroom at EOS and assist needed to negotiate tight space with RW.   Stairs             Wheelchair Mobility    Modified Rankin (Stroke Patients Only)       Balance Overall balance assessment: Needs assistance Sitting-balance support: Feet supported Sitting balance-Leahy Scale: Good     Standing balance support: During functional activity, Reliant on assistive device for balance, Bilateral upper extremity supported Standing balance-Leahy Scale: Poor  Cognition Arousal/Alertness: Awake/alert Behavior During Therapy: WFL for tasks assessed/performed Overall Cognitive Status: Within Functional Limits for tasks assessed                                           Exercises      General Comments        Pertinent Vitals/Pain Pain Assessment Pain Assessment: No/denies pain         02/11/22 0954  PT - End of Session  Equipment Utilized During Treatment Gait belt  Activity Tolerance Patient tolerated treatment well  Patient left in bed;with call bell/phone within reach;with bed alarm set  Nurse Communication Mobility status   PT - Assessment/Plan  PT Plan Current plan remains appropriate  PT Visit Diagnosis Unsteadiness on feet (R26.81);Other abnormalities of gait and mobility (R26.89);Difficulty in walking, not elsewhere classified (R26.2)  PT Frequency (ACUTE ONLY) Min 3X/week  Follow Up Recommendations Skilled nursing-short term rehab (<3 hours/day)  Can patient physically be transported by private vehicle Yes  Assistance recommended at discharge Frequent or constant Supervision/Assistance  Patient can return home with the following A little help with walking and/or transfers;A little help with bathing/dressing/bathroom;Assistance with cooking/housework;Assist for transportation;Help with stairs or ramp for entrance  PT equipment None recommended by PT  AM-PAC PT "6 Clicks" Mobility Outcome Measure (Version 2)  Help needed turning from your back to your side while in a flat bed without using bedrails? 3  Help needed moving from lying on your back to sitting on the side of a flat bed without using bedrails? 3  Help needed moving to and from a bed to a chair (including a wheelchair)? 3  Help needed standing up from a chair using your arms (e.g., wheelchair or bedside chair)? 3  Help needed to walk in hospital room? 3  Help needed climbing 3-5 steps with a railing?  2  6 Click Score 17  Consider Recommendation of Discharge To: Home with [facility]  Progressive Mobility  What is the highest level of mobility based on the progressive mobility assessment? Level 5 (Walks with assist in room/hall) - Balance while stepping  forward/back and can walk in room with assist - Complete  Activity Ambulated with assistance in hallway  PT Goal Progression  Progress towards PT goals Progressing toward goals  Acute Rehab PT Goals  PT Goal Formulation With patient  Time For Goal Achievement 02/21/22  Potential to Achieve Goals Good  PT Time Calculation  PT Start Time (ACUTE ONLY) 0934  PT Stop Time (ACUTE ONLY) 1009  PT Time Calculation (min) (ACUTE ONLY) 35 min  PT General Charges  $$ ACUTE PT VISIT 1 Visit  PT Treatments  $Gait Training 8-22 mins  $Therapeutic Activity 8-22 mins    Verner Mould, DPT Acute Rehabilitation Services Office [phone]  02/11/22 4:13 PM

## 2022-02-11 NOTE — Care Management Important Message (Signed)
Important Message  Patient Details IM Letter given Name: Katie Alvarado MRN: [id number] Date of Birth: 05/09/1925   Medicare Important Message Given:  Yes     Kerin Salen 02/11/2022, 2:28 PM

## 2022-02-12 DIAGNOSIS — J9601 Acute respiratory failure with hypoxia: Secondary | ICD-10-CM

## 2022-02-12 DIAGNOSIS — J441 Chronic obstructive pulmonary disease with (acute) exacerbation: Secondary | ICD-10-CM | POA: Diagnosis not present

## 2022-02-12 LAB — CBC
HCT: 31.7 % — ABNORMAL LOW (ref 36.0–46.0)
Hemoglobin: 10.3 g/dL — ABNORMAL LOW (ref 12.0–15.0)
MCH: 28.8 pg (ref 26.0–34.0)
MCHC: 32.5 g/dL (ref 30.0–36.0)
MCV: 88.5 fL (ref 80.0–100.0)
Platelets: 260 10*3/uL (ref 150–400)
RBC: 3.58 MIL/uL — ABNORMAL LOW (ref 3.87–5.11)
RDW: 13.5 % (ref 11.5–15.5)
WBC: 7.3 10*3/uL (ref 4.0–10.5)
nRBC: 0 % (ref 0.0–0.2)

## 2022-02-12 LAB — BASIC METABOLIC PANEL
Anion gap: 9 (ref 5–15)
BUN: 42 mg/dL — ABNORMAL HIGH (ref 8–23)
CO2: 34 mmol/L — ABNORMAL HIGH (ref 22–32)
Calcium: 8.9 mg/dL (ref 8.9–10.3)
Chloride: 92 mmol/L — ABNORMAL LOW (ref 98–111)
Creatinine, Ser: 1.13 mg/dL — ABNORMAL HIGH (ref 0.44–1.00)
GFR, Estimated: 45 mL/min — ABNORMAL LOW (ref >60)
Glucose, Bld: 84 mg/dL (ref 70–99)
Potassium: 3.4 mmol/L — ABNORMAL LOW (ref 3.5–5.1)
Sodium: 135 mmol/L (ref 135–145)

## 2022-02-12 LAB — GLUCOSE, CAPILLARY
Glucose-Capillary: 82 mg/dL (ref 70–99)
Glucose-Capillary: 140 mg/dL — ABNORMAL HIGH (ref 70–99)
Glucose-Capillary: 245 mg/dL — ABNORMAL HIGH (ref 70–99)
Glucose-Capillary: 183 mg/dL — ABNORMAL HIGH (ref 70–99)

## 2022-02-12 MED ORDER — AMOXICILLIN-POT CLAVULANATE 500-125 MG PO TABS
1.0000 | ORAL_TABLET | Freq: Two times a day (BID) | ORAL | Status: DC
  Administered 2022-02-12 – 2022-02-18 (×12): 1 via ORAL
  Filled 2022-02-12 (×12): qty 1

## 2022-02-12 MED ORDER — AMOXICILLIN-POT CLAVULANATE 875-125 MG PO TABS: 1.0000 | ORAL_TABLET | Freq: Two times a day (BID) | ORAL | Status: DC

## 2022-02-12 MED ORDER — POTASSIUM CHLORIDE CRYS ER 20 MEQ PO TBCR
40.0000 meq | EXTENDED_RELEASE_TABLET | Freq: Once | ORAL | Status: AC
  Administered 2022-02-12: 40 meq via ORAL
  Filled 2022-02-12: qty 2

## 2022-02-12 NOTE — Progress Notes (Signed)
Progress Note    Katie Alvarado   YNW:[id number]  DOB: [birth year]  DOA: 02/06/2022     5 PCP: Katie Cruel, MD  Initial CC: cough, SOB  Hospital Course: Katie Alvarado is a 88 y.o. female with medical history significant for COPD, bronchiectasis, history of Mycobacterium Avium-intracellulare complex (from positive sputum culture on 06/09/2012), type 2 diabetes, hypertension, GERD, HFpEF, history of multifocal mucous plugging at the lung bases (08/30/21), who presented to [facility] ED due to worsening shortness of breath.  Associated with cough and wheezing.  Onset of symptoms about a week PTA. EMS was activated.  Upon EMS arrival she received DuoNebs treatment, IV Solu-Medrol, in route.   In the ED, COVID-19 screening test negative.  No evidence of pneumonia on chest x-ray.  Initially placed on BiPAP, with improvement after treatment with, nebulizer, IV magnesium, and IV steroids via EMS.  She was taken off the BiPAP and placed on 3 L [place] with O2 saturation in the high 90s.   ED Course: Tmax 99.1.  BP 111/53, pulse 59, respiratory rate 15, O2 saturation 97% on 3 L.  Lab studies remarkable for serum potassium 3.4, glucose 214, BUN 32, creatinine 1.10.  GFR 47.  Baseline creatinine 0.8 with GFR greater than 60.  BNP 154.Hemoglobin 10.7 at baseline.  WBC 7.3.  Platelet count 220. Managed with systemic steroids,IV antibiotics antitussives supplemental oxygen    Interval History:  No events overnight.  Breathing has improved since admission.  Sitting up in recliner when seen this morning.  Understands plan is for discharge to rehab.  Assessment and Plan:  Acute COPD exacerbation Mild to moderate bronchiectasis exacerbation Acute hypoxic respiratory failure History of bronchiectasis, most recent sputum culture revealed normal respiratory flora no staph or Pseudomonas.sputum culture ordered -but not done as no sputum for culture yet.  Clinically much improved, continue with empiric Unasyn, oral  prednisone> tapering to 30 mg> 20 mg, continue, bronchodilators antitussives, saline nebulizer.   T2DM with uncontrolled hyperglycemia: Blood sugars stable continue her Glucotrol, sliding scale and monitor as below. HBA1c stable 6.8 Anxiety/depression: Mood stable.   Hypokalemia resolved. GERD continue PPI. Hypertension: BP fairly controlled, continue  PTA hydralazine, torsemide and metoprolol.  Last echo from 2021 reviewed EF 60 to 65%, G1 DD Normocytic anemia: 9-10g.Monitor  CKD stage IIIa creatinine holding stable. Monitor.   Physical debility/deconditioning: cont PT OT. Plan is for SNF   Goals of care: Discussed by Dr. Lupita Leash regarding her CODE STATUS patient confirmed that she is DNR do not want intubation or CPR.  Old records reviewed in assessment of this patient  Antimicrobials:   DVT prophylaxis:  enoxaparin (LOVENOX) injection 30 mg Start: 02/07/22 1000   Code Status:   Code Status: DNR  Mobility Assessment (last 72 hours)     Mobility Assessment     Row Name 02/12/22 0820 02/11/22 2107 02/11/22 0954 02/11/22 0836 02/10/22 2035   Does patient have an order for bedrest or is patient medically unstable No - Continue assessment No - Continue assessment -- No - Continue assessment No - Continue assessment   What is the highest level of mobility based on the progressive mobility assessment? Level 5 (Walks with assist in room/hall) - Balance while stepping forward/back and can walk in room with assist - Complete Level 5 (Walks with assist in room/hall) - Balance while stepping forward/back and can walk in room with assist - Complete Level 5 (Walks with assist in room/hall) - Balance while stepping forward/back and can walk  in room with assist - Complete Level 5 (Walks with assist in room/hall) - Balance while stepping forward/back and can walk in room with assist - Complete Level 5 (Walks with assist in room/hall) - Balance while stepping forward/back and can walk in room with assist -  Complete    Row Name 02/09/22 2015           Does patient have an order for bedrest or is patient medically unstable No - Continue assessment       What is the highest level of mobility based on the progressive mobility assessment? Level 5 (Walks with assist in room/hall) - Balance while stepping forward/back and can walk in room with assist - Complete                Barriers to discharge:  Disposition Plan:  SNF Status is: Inpt  Objective: Blood pressure 134/65, pulse 67, temperature (!) 97.2 F (36.2 C), temperature source Oral, resp. rate 18, weight 54 kg, SpO2 96 %.  Examination:  Physical Exam Constitutional:      Appearance: Normal appearance.  HENT:     Head: Normocephalic and atraumatic.     Mouth/Throat:     Mouth: Mucous membranes are moist.  Eyes:     Extraocular Movements: Extraocular movements intact.  Cardiovascular:     Rate and Rhythm: Normal rate and regular rhythm.  Pulmonary:     Effort: Pulmonary effort is normal.     Breath sounds: Wheezing and rhonchi present.  Abdominal:     General: Bowel sounds are normal. There is no distension.     Palpations: Abdomen is soft.     Tenderness: There is no abdominal tenderness.  Musculoskeletal:        General: Normal range of motion.     Cervical back: Normal range of motion and neck supple.  Skin:    General: Skin is warm.  Neurological:     General: No focal deficit present.     Mental Status: She is alert.  Psychiatric:        Mood and Affect: Mood normal.      Consultants:    Procedures:    Data Reviewed: Results for orders placed or performed during the hospital encounter of 02/06/22 (from the past 24 hour(s))  Glucose, capillary     Status: None   Collection Time: 02/11/22  9:47 PM  Result Value Ref Range   Glucose-Capillary 99 70 - 99 mg/dL  Basic metabolic panel     Status: Abnormal   Collection Time: 02/12/22  5:11 AM  Result Value Ref Range   Sodium 135 135 - 145 mmol/L    Potassium 3.4 (L) 3.5 - 5.1 mmol/L   Chloride 92 (L) 98 - 111 mmol/L   CO2 34 (H) 22 - 32 mmol/L   Glucose, Bld 84 70 - 99 mg/dL   BUN 42 (H) 8 - 23 mg/dL   Creatinine, Ser 1.13 (H) 0.44 - 1.00 mg/dL   Calcium 8.9 8.9 - 10.3 mg/dL   GFR, Estimated 45 (L) >60 mL/min   Anion gap 9 5 - 15  CBC     Status: Abnormal   Collection Time: 02/12/22  5:11 AM  Result Value Ref Range   WBC 7.3 4.0 - 10.5 K/uL   RBC 3.58 (L) 3.87 - 5.11 MIL/uL   Hemoglobin 10.3 (L) 12.0 - 15.0 g/dL   HCT 31.7 (L) 36.0 - 46.0 %   MCV 88.5 80.0 - 100.0 fL   MCH  28.8 26.0 - 34.0 pg   MCHC 32.5 30.0 - 36.0 g/dL   RDW 13.5 11.5 - 15.5 %   Platelets 260 150 - 400 K/uL   nRBC 0.0 0.0 - 0.2 %  Glucose, capillary     Status: None   Collection Time: 02/12/22  7:39 AM  Result Value Ref Range   Glucose-Capillary 82 70 - 99 mg/dL  Glucose, capillary     Status: Abnormal   Collection Time: 02/12/22 11:59 AM  Result Value Ref Range   Glucose-Capillary 140 (H) 70 - 99 mg/dL  Glucose, capillary     Status: Abnormal   Collection Time: 02/12/22  4:18 PM  Result Value Ref Range   Glucose-Capillary 245 (H) 70 - 99 mg/dL    I have Reviewed nursing notes, Vitals, and Lab results since pt's last encounter. Pertinent lab results : see above I have ordered test including BMP, CBC, Mg I have reviewed the last note from staff over past 24 hours I have discussed pt's care plan and test results with nursing staff, case manager   LOS: 5 days   Dwyane Dee, MD [facility] 02/12/2022, 6:41 PM

## 2022-02-12 NOTE — Progress Notes (Signed)
PT Cancellation Note  Patient Details Name: Katie Alvarado MRN: [id number] DOB: 1926/02/06   Cancelled Treatment:    Reason Eval/Treat Not Completed: Patient declined, no reason specified (pt feeling fatigued and not up for therapy. requesting to rest. Will follow up as schedule allows.)   [place], [place] Office [phone]  02/12/22 4:06 PM

## 2022-02-12 NOTE — TOC Initial Note (Signed)
Transition of Care [facility]) - Initial/Assessment Note    Patient Details  Name: Katie Alvarado MRN: [id number] Date of Birth: 21-Jun-1925  Transition of Care [facility]) CM/SW Contact:    Ross Ludwig, LCSW Phone Number: 02/12/2022, 3:33 PM  Clinical Narrative:                  CSW received consult that patient needs SNF.  CSW spoke to patient, and she is agreeable to going to SNF as long as insurance pays for it.  CSW explained to her that yes she is covered by insurance for short term rehab.  CSW explained what to expect and the process for getting snf placement for rehab.  Patient originally was declining SNF, but now has changed her mind and agreeable to it.  CSW faxed patient's information out to SNFs and waiting for bed offers.  CSW to continue to follow patient's progress throughout discharge planning.  Expected Discharge Plan: Skilled Nursing Facility Barriers to Discharge: Continued Medical Work up, SNF Pending bed offer   Patient Goals and CMS Choice Patient states their goals for this hospitalization and ongoing recovery are:: To go to SNF for rehab, then return back home. CMS Medicare.gov Compare Post Acute Care list provided to:: Patient Choice offered to / list presented to : Patient  Expected Discharge Plan and Services Expected Discharge Plan: [place] Choice: [place] arrangements for the past 2 months: Single Family Home Expected Discharge Date: 02/10/22                                    Prior Living Arrangements/Services Living arrangements for the past 2 months: Single Family Home Lives with:: Self, Friends Patient language and need for interpreter reviewed:: Yes Do you feel safe going back to the place where you live?: No   Patient wants to go to SNF for short term rehab then return back home.  Need for Family Participation in Patient Care: No (Comment) Care giver support system in place?: Yes  (comment)   Criminal Activity/Legal Involvement Pertinent to Current Situation/Hospitalization: No - Comment as needed  Activities of Daily Living Home Assistive Devices/Equipment: Walker (specify type) ADL Screening (condition at time of admission) Patient's cognitive ability adequate to safely complete daily activities?: Yes Is the patient deaf or have difficulty hearing?: Yes Does the patient have difficulty seeing, even when wearing glasses/contacts?: Yes Does the patient have difficulty concentrating, remembering, or making decisions?: No Patient able to express need for assistance with ADLs?: Yes Does the patient have difficulty dressing or bathing?: Yes Independently performs ADLs?: Yes (appropriate for developmental age) Does the patient have difficulty walking or climbing stairs?: Yes Weakness of Legs: Both Weakness of Arms/Hands: None  Permission Sought/Granted Permission sought to share information with : Case Manager, Customer service manager, Family Supports Permission granted to share information with : Yes, Verbal Permission Granted  Share Information with NAME: Marcial Pacas [phone]  [phone]  Pilar Jarvis Granddaughter   [phone]  Prillaman,Peggy Friend [phone]  [phone]  Permission granted to share info w AGENCY: SNF admissions        Emotional Assessment Appearance:: Appears stated age Attitude/Demeanor/Rapport: Engaged Affect (typically observed): Calm, Appropriate, Accepting, Pleasant, Stable Orientation: : Oriented to Self, Oriented to Place, Oriented to  Time, Oriented to Situation Alcohol / Substance Use: Not Applicable Psych Involvement: No (comment)  Admission diagnosis:  COPD exacerbation ([place]) [J44.1] Acute respiratory failure with hypoxia ([place]) [J96.01] COPD with acute exacerbation ([place]) [J44.1] Patient Active Problem List   Diagnosis Date Noted   Acute bronchitis 08/30/2021   Acute respiratory failure with  hypoxia ([place]) 08/30/2021   Pancreatic lesion 08/30/2021   Left groin pain 08/30/2021   2019 novel coronavirus disease (COVID-19) 04/23/2021   Palpitations 02/23/2021   Malnutrition of moderate degree 01/03/2021   COPD with acute exacerbation ([place]) 01/02/2021   Chronic diastolic CHF (congestive heart failure) ([place]) 01/02/2021   Anxiety 09/14/2020   Bronchiolectasis ([place]) 09/14/2020   Chronic anxiety 09/14/2020   Drug-induced myopathy 09/14/2020   Edema 09/14/2020   Gout 09/14/2020   History of pneumonia 09/14/2020   History of squamous cell carcinoma 09/14/2020   Hyperlipidemia 09/14/2020   Hypertensive heart disease without congestive heart failure 09/14/2020   Impairment of balance 09/14/2020   Iron deficiency anemia 09/14/2020   Legal blindness, as defined in [place] 09/14/2020   Lumbar radiculopathy 09/14/2020   Lumbar spondylosis 09/14/2020   Microalbuminuria 09/14/2020   Osteoarthritis of knee 09/14/2020   Pseudogout 09/14/2020   Solitary pulmonary nodule 09/14/2020   Vitamin D deficiency 09/14/2020   Pain in right hand 08/08/2020   EKG abnormality 12/24/2019   Hypertensive urgency 12/24/2019   Acute hyponatremia 12/23/2019   Neck pain 12/13/2019   AKI (acute kidney injury) ([place]) 01/10/2019   Thrombocytosis 12/25/2018   Degeneration of lumbar intervertebral disc 10/28/2018   Left-sided chest wall pain 06/01/2018   Chronic back pain 05/05/2018   Low back pain radiating down leg 05/05/2018   Pneumonia of both lungs due to infectious organism 04/28/2018   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   Allergic urticaria 03/05/2016   Gastritis 03/05/2016   Chronic rhinitis 01/09/2016   Atypical mycobacterial infection of lung ([place])    Normocytic anemia 07/18/2015   Bronchiectasis without complication ([place]) 18/56/3149   Renal artery stenosis ([place]) 04/25/2015   Uncontrolled hypertension 04/25/2015   Lower extremity pain, bilateral 08/04/2012   Klebsiella infection  08/01/2012   Uncontrolled NIDDM-2 with hyperglycemia 07/29/2012   Generalized weakness 07/29/2012   Hyponatremia 04/11/2011   Leucocytosis 04/11/2011   BRONCHIECTASIS, + MAIC 03/25/2010   DIZZINESS 02/02/2010   MUSCULOSKELETAL PAIN 09/25/2007   INSOMNIA 05/20/2007   ACUTE NASOPHARYNGITIS 04/20/2007   Acute exacerbation of bronchiectasis ([place]) 04/20/2007   GERD without esophagitis 04/20/2007   PCP:  [name], MD Pharmacy:   [facility], [address] [place] [address] Phone: [phone] Fax: [phone]  [id number] [place] [address] [place] [address]Phone: [phone]: [phone] [address] [address]Phone: [phone]: [phone]    Social Determinants of Health (SDOH) Interventions    Readmission Risk Interventions     No data to display

## 2022-02-12 NOTE — NC FL2 (Signed)
[place] MEDICAID FL2 LEVEL OF CARE SCREENING TOOL     IDENTIFICATION  Patient Name: Katie Alvarado Birthdate: July 22, 1925 Sex: female Admission Date (Current Location): 02/06/2022  [facility] and [place] Number:  Herbalist and Address:  [facility],  [place], [place]      Provider Number: [id number]  Attending Physician Name and Address:  Dwyane Dee, MD  Relative Name and Phone Number:  Marcial Pacas [phone]  [phone]  Pilar Jarvis Granddaughter   [phone]  Prillaman,Peggy Friend [phone]  [phone]    Current Level of Care: Hospital Recommended Level of Care: [place] Prior Approval Number:    Date Approved/Denied:   PASRR Number: [id number]  Discharge Plan: SNF    Current Diagnoses: Patient Active Problem List   Diagnosis Date Noted   Acute bronchitis 08/30/2021   Acute respiratory failure with hypoxia ([place]) 08/30/2021   Pancreatic lesion 08/30/2021   Left groin pain 08/30/2021   2019 novel coronavirus disease (COVID-19) 04/23/2021   Palpitations 02/23/2021   Malnutrition of moderate degree 01/03/2021   COPD with acute exacerbation ([place]) 01/02/2021   Chronic diastolic CHF (congestive heart failure) ([place]) 01/02/2021   Anxiety 09/14/2020   Bronchiolectasis ([place]) 09/14/2020   Chronic anxiety 09/14/2020   Drug-induced myopathy 09/14/2020   Edema 09/14/2020   Gout 09/14/2020   History of pneumonia 09/14/2020   History of squamous cell carcinoma 09/14/2020   Hyperlipidemia 09/14/2020   Hypertensive heart disease without congestive heart failure 09/14/2020   Impairment of balance 09/14/2020   Iron deficiency anemia 09/14/2020   Legal blindness, as defined in [place] 09/14/2020   Lumbar radiculopathy 09/14/2020   Lumbar spondylosis 09/14/2020   Microalbuminuria 09/14/2020   Osteoarthritis of knee 09/14/2020   Pseudogout 09/14/2020   Solitary pulmonary nodule 09/14/2020    Vitamin D deficiency 09/14/2020   Pain in right hand 08/08/2020   EKG abnormality 12/24/2019   Hypertensive urgency 12/24/2019   Acute hyponatremia 12/23/2019   Neck pain 12/13/2019   AKI (acute kidney injury) ([place]) 01/10/2019   Thrombocytosis 12/25/2018   Degeneration of lumbar intervertebral disc 10/28/2018   Left-sided chest wall pain 06/01/2018   Chronic back pain 05/05/2018   Low back pain radiating down leg 05/05/2018   Pneumonia of both lungs due to infectious organism 04/28/2018   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   Allergic urticaria 03/05/2016   Gastritis 03/05/2016   Chronic rhinitis 01/09/2016   Atypical mycobacterial infection of lung ([place])    Normocytic anemia 07/18/2015   Bronchiectasis without complication ([place]) 37/62/8315   Renal artery stenosis ([place]) 04/25/2015   Uncontrolled hypertension 04/25/2015   Lower extremity pain, bilateral 08/04/2012   Klebsiella infection 08/01/2012   Uncontrolled NIDDM-2 with hyperglycemia 07/29/2012   Generalized weakness 07/29/2012   Hyponatremia 04/11/2011   Leucocytosis 04/11/2011   BRONCHIECTASIS, + MAIC 03/25/2010   DIZZINESS 02/02/2010   MUSCULOSKELETAL PAIN 09/25/2007   INSOMNIA 05/20/2007   ACUTE NASOPHARYNGITIS 04/20/2007   Acute exacerbation of bronchiectasis ([place]) 04/20/2007   GERD without esophagitis 04/20/2007    Orientation RESPIRATION BLADDER Height & Weight     Self, Time, Situation, Place  O2 (1.5 L) Continent Weight: 119 lb 0.8 oz (54 kg) Height:     BEHAVIORAL SYMPTOMS/MOOD NEUROLOGICAL BOWEL NUTRITION STATUS      Continent    AMBULATORY STATUS COMMUNICATION OF NEEDS Skin   Limited Assist Verbally Normal  Personal Care Assistance Level of Assistance  Bathing, Feeding, Dressing Bathing Assistance: Limited assistance Feeding assistance: Independent Dressing Assistance: Limited assistance     Functional Limitations Info  Sight, Hearing, Speech Sight Info:  Adequate Hearing Info: Adequate Speech Info: Adequate    SPECIAL CARE FACTORS FREQUENCY  PT (By licensed PT), OT (By licensed OT)     PT Frequency: Minimum 5x a week OT Frequency: Minimum 5x a week            Contractures Contractures Info: Not present    Additional Factors Info  Allergies, Code Status, Insulin Sliding Scale Code Status Info: DNR Allergies Info: Welchol (Colesevelam Hcl)   Amlodipine   Atorvastatin   Coreg (Carvedilol)   Doxycycline   Glucosamine   Hydrocodone-acetaminophen   Mobic (Meloxicam)   Sulfa Antibiotics   Telmisartan   Tramadol   Celecoxib   Statins   Insulin Sliding Scale Info: insulin aspart (novoLOG) injection 0-9 Units 3x a day with meals.       Current Medications (02/12/2022):  This is the current hospital active medication list Current Facility-Administered Medications  Medication Dose Route Frequency Provider Last Rate Last Admin   ALPRAZolam Duanne Moron) tablet 0.5 mg  0.5 mg Oral BID PRN Kc, Maren Beach, MD   0.5 mg at 02/12/22 0935   amoxicillin-clavulanate (AUGMENTIN) 500-125 MG per tablet 1 tablet  1 tablet Oral BID Dwyane Dee, MD       aspirin EC tablet 81 mg  81 mg Oral Daily Kc, Ramesh, MD   81 mg at 02/12/22 0934   budesonide (PULMICORT) nebulizer solution 0.25 mg  0.25 mg Nebulization BID Kc, Ramesh, MD   0.25 mg at 02/12/22 1002   chlorpheniramine-HYDROcodone (TUSSIONEX) 10-8 MG/5ML suspension 2.5 mL  2.5 mL Oral Q12H PRN Kc, Ramesh, MD   2.5 mL at 02/11/22 1319   enoxaparin (LOVENOX) injection 30 mg  30 mg Subcutaneous Q24H Hall, Carole N, DO   30 mg at 02/12/22 0935   fluticasone (FLONASE) 50 MCG/ACT nasal spray 1 spray  1 spray Each Nare Daily Kc, Ramesh, MD   1 spray at 02/12/22 0935   glipiZIDE (GLUCOTROL) tablet 5 mg  5 mg Oral QAC breakfast Kc, Ramesh, MD   5 mg at 02/12/22 0933   hydrALAZINE (APRESOLINE) tablet 50 mg  50 mg Oral TID Antonieta Pert, MD   50 mg at 02/12/22 0933   insulin aspart (novoLOG) injection 0-5 Units  0-5 Units  Subcutaneous QHS Irene Pap N, DO   3 Units at 02/07/22 0055   insulin aspart (novoLOG) injection 0-9 Units  0-9 Units Subcutaneous TID WC Irene Pap N, DO   1 Units at 02/12/22 1226   ipratropium-albuterol (DUONEB) 0.5-2.5 (3) MG/3ML nebulizer solution 3 mL  3 mL Nebulization BID Kc, Ramesh, MD   3 mL at 02/12/22 1002   menthol-cetylpyridinium (CEPACOL) lozenge 3 mg  1 lozenge Oral PRN Kc, Maren Beach, MD       metoprolol tartrate (LOPRESSOR) tablet 25 mg  25 mg Oral QHS Kc, Ramesh, MD   25 mg at 02/11/22 2107   metoprolol tartrate (LOPRESSOR) tablet 50 mg  50 mg Oral BID Kc, Ramesh, MD   50 mg at 02/12/22 0935   pantoprazole (PROTONIX) EC tablet 40 mg  40 mg Oral Daily Kc, Ramesh, MD   40 mg at 02/12/22 0936   polyethylene glycol (MIRALAX / GLYCOLAX) packet 17 g  17 g Oral Daily PRN Kayleen Memos, DO   17 g at 02/09/22 2122  predniSONE (DELTASONE) tablet 20 mg  20 mg Oral Q breakfast Kc, Ramesh, MD   20 mg at 02/12/22 0935   prochlorperazine (COMPAZINE) injection 5 mg  5 mg Intravenous Q6H PRN Irene Pap N, DO       torsemide (DEMADEX) tablet 20 mg  20 mg Oral Daily Kc, Ramesh, MD   20 mg at 02/12/22 0936   traMADol (ULTRAM) tablet 50 mg  50 mg Oral BID PRN Antonieta Pert, MD   50 mg at 02/11/22 2107   Vitamin E CAPS 400 Units  400 Units Oral Once per day on Mon Thu Kc, Ramesh, MD   400 Units at 02/11/22 0920     Discharge Medications: Please see discharge summary for a list of discharge medications.  Relevant Imaging Results:  Relevant Lab Results:   Additional Information SSN [id number]  Ross Ludwig, LCSW

## 2022-02-13 ENCOUNTER — Inpatient Hospital Stay (HOSPITAL_COMMUNITY): Payer: Medicare Other

## 2022-02-13 DIAGNOSIS — J441 Chronic obstructive pulmonary disease with (acute) exacerbation: Secondary | ICD-10-CM | POA: Diagnosis not present

## 2022-02-13 DIAGNOSIS — J9601 Acute respiratory failure with hypoxia: Secondary | ICD-10-CM | POA: Diagnosis not present

## 2022-02-13 LAB — GLUCOSE, CAPILLARY
Glucose-Capillary: 104 mg/dL — ABNORMAL HIGH (ref 70–99)
Glucose-Capillary: 299 mg/dL — ABNORMAL HIGH (ref 70–99)
Glucose-Capillary: 192 mg/dL — ABNORMAL HIGH (ref 70–99)
Glucose-Capillary: 192 mg/dL — ABNORMAL HIGH (ref 70–99)

## 2022-02-13 NOTE — Progress Notes (Signed)
Occupational Therapy Treatment Patient Details Name: Katie Alvarado MRN: [id number] DOB: 1925-05-26 Today's Date: 02/13/2022   History of present illness Katie Alvarado is a 88 y.o. female with medical history significant for COPD, bronchiectasis, history of Mycobacterium Avium-intracellulare complex, type 2 diabetes, hypertension, GERD, HFpEF, history of multifocal mucous plugging at the lung bases who presented to [facility] ED due to worsening shortness of breath.  Associated with cough and wheezing.   OT comments  Patient was pleasant and cooperative with session. Patient required mod A for sit to stand from commode with lower surface height with cues for proper hand placement. Patient was able to participate in session without O2 noted to drop to 88% on RA at end of session but able to recover to 94% on RA within 30 seconds of sitting down in bed. Patient's discharge plan remains appropriate at this time. OT will continue to follow acutely.     Recommendations for follow up therapy are one component of a multi-disciplinary discharge planning process, led by the attending physician.  Recommendations may be updated based on patient status, additional functional criteria and insurance authorization.    Follow Up Recommendations  Skilled nursing-short term rehab (<3 hours/day)    Assistance Recommended at Discharge Frequent or constant Supervision/Assistance  Patient can return home with the following  A lot of help with bathing/dressing/bathroom;Direct supervision/assist for medications management;A lot of help with walking and/or transfers;Direct supervision/assist for financial management;Assist for transportation;Assistance with cooking/housework;Help with stairs or ramp for entrance   Equipment Recommendations  None recommended by OT    Recommendations for Other Services      Precautions / Restrictions Precautions Precautions: Fall Restrictions Weight Bearing Restrictions: No        Mobility Bed Mobility                    Transfers                         Balance                                           ADL either performed or assessed with clinical judgement   ADL Overall ADL's : Needs assistance/impaired     Grooming: Standing;Wash/dry Geophysical data processor Transfer: Minimal assistance;Ambulation;Rolling walker (2 wheels) Toilet Transfer Details (indicate cue type and reason): with increased time Toileting- Clothing Manipulation and Hygiene: Minimal assistance;Sit to/from stand Toileting - Clothing Manipulation Details (indicate cue type and reason): to ensure clothing was all the way down. adult absorbent undergarments were snug            Extremity/Trunk Assessment              Vision       Perception     Praxis      Cognition Arousal/Alertness: Awake/alert Behavior During Therapy: WFL for tasks assessed/performed Overall Cognitive Status: Within Functional Limits for tasks assessed                                          Exercises      Shoulder Instructions       General Comments  Pertinent Vitals/ Pain       Pain Assessment Pain Assessment: No/denies pain  Home Living                                          Prior Functioning/Environment              Frequency  Min 2X/week        Progress Toward Goals  OT Goals(current goals can now be found in the care plan section)  Progress towards OT goals: Progressing toward goals     Plan Discharge plan remains appropriate    Co-evaluation                 AM-PAC OT "6 Clicks" Daily Activity     Outcome Measure   Help from another person eating meals?: None Help from another person taking care of personal grooming?: A Little Help from another person toileting, which includes using toliet, bedpan, or urinal?: A Lot Help from another person bathing  (including washing, rinsing, drying)?: A Lot Help from another person to put on and taking off regular upper body clothing?: A Little Help from another person to put on and taking off regular lower body clothing?: A Lot 6 Click Score: 16    End of Session Equipment Utilized During Treatment: Gait belt;Rolling walker (2 wheels)  OT Visit Diagnosis: Unsteadiness on feet (R26.81);Muscle weakness (generalized) (M62.81)   Activity Tolerance Patient tolerated treatment well   Patient Left in bed;with call bell/phone within reach;with bed alarm set   Nurse Communication Mobility status        Time: 8676-7209 OT Time Calculation (min): 28 min  Charges: OT General Charges $OT Visit: 1 Visit OT Treatments $Self Care/Home Management : 23-37 mins  Rennie Plowman, MS Acute Rehabilitation Department Office# [phone]   Marcellina Millin 02/13/2022, 2:56 PM

## 2022-02-13 NOTE — Progress Notes (Signed)
Physical Therapy Treatment Patient Details Name: Katie Alvarado MRN: [id number] DOB: [birth year] Today's Date: 02/13/2022   History of Present Illness CADY HAFEN is a 88 y.o. female with medical history significant for COPD, bronchiectasis, history of Mycobacterium Avium-intracellulare complex, type 2 diabetes, hypertension, GERD, HFpEF, history of multifocal mucous plugging at the lung bases who presented to [facility] ED due to worsening shortness of breath.  Associated with cough and wheezing.    PT Comments    Patient making excellent progress despite anxiety related to fatigue with mobility. Pt required min assist for transfers and gait with RW and encouragement to progress distance. Pt ambulated ~180' with rest breaks, SpO2 94-97% on RA with mobility. EOS pt in recliner with alarm on and call bell. Continue to recommend SNF for rehab, will progress as able.   Recommendations for follow up therapy are one component of a multi-disciplinary discharge planning process, led by the attending physician.  Recommendations may be updated based on patient status, additional functional criteria and insurance authorization.  PT Recommendation   Follow Up Recommendations Skilled nursing-short term rehab (<3 hours/day) Filed 02/13/2022 1024  Can patient physically be transported by private vehicle Yes Filed 02/13/2022 1024  Assistance recommended at discharge Frequent or constant Supervision/Assistance Filed 02/13/2022 1024  Patient can return home with the following A little help with walking and/or transfers, A little help with bathing/dressing/bathroom, Assistance with cooking/housework, Assist for transportation, Help with stairs or ramp for entrance Filed 02/13/2022 1024  PT equipment None recommended by PT Filed 02/13/2022 1024   Precautions / Restrictions Precautions Precautions: Fall Restrictions Weight Bearing Restrictions: No     Mobility  Bed Mobility Overal bed mobility: Needs  Assistance Bed Mobility: Supine to Sit     Supine to sit: HOB elevated, Min guard     General bed mobility comments: pt taking extra time to sit up to EOB. use of bed rail to pivot and scoot.    Transfers Overall transfer level: Needs assistance Equipment used: Rolling walker (2 wheels) Transfers: Sit to/from Stand Sit to Stand: Min assist           General transfer comment: pt using Rt UE on RW to rise from EOB. Cues to use bil UE back on armrest to rise from recliner.    Ambulation/Gait Ambulation/Gait assistance: Min assist, Min guard Gait Distance (Feet): 180 Feet (30, 150, 180) Assistive device: Rolling walker (2 wheels) Gait Pattern/deviations: Step-through pattern, Decreased step length - right, Decreased step length - left, Decreased stride length, Shuffle, Trunk flexed Gait velocity: decr     General Gait Details: cues for safe step pattern and proximity to RW. pt ambulate in hallway with with intermittent assist to keep walker in safe position, knees slightly flexed but no buckling noted. pt flexing forward and leaning on RW when fatigued. seated rest required wtih pt reporting "just tired". with encouragement pt able to increase distance. SpO2 97% on RA with gait.   Stairs             Wheelchair Mobility    Modified Rankin (Stroke Patients Only)       Balance Overall balance assessment: Needs assistance Sitting-balance support: Feet supported Sitting balance-Leahy Scale: Good     Standing balance support: During functional activity, Reliant on assistive device for balance, Bilateral upper extremity supported Standing balance-Leahy Scale: Poor  Cognition Arousal/Alertness: Awake/alert Behavior During Therapy: WFL for tasks assessed/performed Overall Cognitive Status: Within Functional Limits for tasks assessed                                          Exercises      General Comments         Pertinent Vitals/Pain Pain Assessment Pain Assessment: No/denies pain      02/13/22 1024  PT - End of Session  Equipment Utilized During Treatment Gait belt  Activity Tolerance Patient tolerated treatment well  Patient left in bed;with call bell/phone within reach;with bed alarm set  Nurse Communication Mobility status   PT - Assessment/Plan  PT Plan Current plan remains appropriate  PT Visit Diagnosis Unsteadiness on feet (R26.81);Other abnormalities of gait and mobility (R26.89);Difficulty in walking, not elsewhere classified (R26.2)  PT Frequency (ACUTE ONLY) Min 3X/week  Follow Up Recommendations Skilled nursing-short term rehab (<3 hours/day)  Can patient physically be transported by private vehicle Yes  Assistance recommended at discharge Frequent or constant Supervision/Assistance  Patient can return home with the following A little help with walking and/or transfers;A little help with bathing/dressing/bathroom;Assistance with cooking/housework;Assist for transportation;Help with stairs or ramp for entrance  PT equipment None recommended by PT  AM-PAC PT "6 Clicks" Mobility Outcome Measure (Version 2)  Help needed turning from your back to your side while in a flat bed without using bedrails? 3  Help needed moving from lying on your back to sitting on the side of a flat bed without using bedrails? 3  Help needed moving to and from a bed to a chair (including a wheelchair)? 3  Help needed standing up from a chair using your arms (e.g., wheelchair or bedside chair)? 3  Help needed to walk in hospital room? 3  Help needed climbing 3-5 steps with a railing?  2  6 Click Score 17  Consider Recommendation of Discharge To: Home with [facility]  Progressive Mobility  What is the highest level of mobility based on the progressive mobility assessment? Level 5 (Walks with assist in room/hall) - Balance while stepping forward/back and can walk in room with assist - Complete  Activity  Ambulated with assistance in hallway  PT Goal Progression  Progress towards PT goals Progressing toward goals  Acute Rehab PT Goals  PT Goal Formulation With patient  Time For Goal Achievement 02/21/22  Potential to Achieve Goals Good  PT Time Calculation  PT Start Time (ACUTE ONLY) 0956  PT Stop Time (ACUTE ONLY) 1024  PT Time Calculation (min) (ACUTE ONLY) 28 min  PT General Charges  $$ ACUTE PT VISIT 1 Visit  PT Treatments  $Gait Training 23-37 mins     Verner Mould, DPT Acute Rehabilitation Services Office [phone]  02/13/22 4:47 PM

## 2022-02-13 NOTE — Progress Notes (Signed)
Orthopedic Tech Progress Note Patient Details:  Katie Alvarado 08-09-1925 [id number]  Patient ID: Katie Alvarado, female   DOB: 01-Oct-1925, 88 y.o.   MRN: [id number]  Katie Alvarado 02/13/2022, 7:36 PM Right velcro thumb spica brace applied

## 2022-02-13 NOTE — Progress Notes (Signed)
Progress Note    GENEIVE SANDSTROM   GEX:[id number]  DOB: 10-Apr-1926  DOA: 02/06/2022     6 PCP: [name], MD  Initial CC: cough, SOB  Hospital Course: Ms. Benham is a 88 y.o. female with medical history significant for COPD, bronchiectasis, history of Mycobacterium Avium-intracellulare complex (from positive sputum culture on 06/09/2012), type 2 diabetes, hypertension, GERD, HFpEF, history of multifocal mucous plugging at the lung bases (08/30/21), who presented to [facility] ED due to worsening shortness of breath.  Associated with cough and wheezing.  Onset of symptoms about a week PTA. EMS was activated.  Upon EMS arrival she received DuoNebs treatment, IV Solu-Medrol, in route.   In the ED, COVID-19 screening test negative.  No evidence of pneumonia on chest x-ray.  Initially placed on BiPAP, with improvement after treatment with, nebulizer, IV magnesium, and IV steroids via EMS.  She was taken off the BiPAP and placed on 3 L [place] with O2 saturation in the high 90s.   ED Course: Tmax 99.1.  BP 111/53, pulse 59, respiratory rate 15, O2 saturation 97% on 3 L.  Lab studies remarkable for serum potassium 3.4, glucose 214, BUN 32, creatinine 1.10.  GFR 47.  Baseline creatinine 0.8 with GFR greater than 60.  BNP 154.Hemoglobin 10.7 at baseline.  WBC 7.3.  Platelet count 220. Managed with systemic steroids,IV antibiotics antitussives supplemental oxygen   Interval History:  No events overnight.  Using incentive spirometer fairly well this morning.  Breathing continues to improve.  She understands plan for rehab and is still okay with it.  Assessment and Plan:  Acute COPD exacerbation Mild to moderate bronchiectasis exacerbation Acute hypoxic respiratory failure History of bronchiectasis, most recent sputum culture revealed normal respiratory flora no staph or Pseudomonas.sputum culture ordered -but not done as no sputum for culture yet.  Clinically much improved, continue with empiric Unasyn,  oral prednisone> tapering to 30 mg> 20 mg, continue, bronchodilators antitussives, saline nebulizer.   T2DM with uncontrolled hyperglycemia: Blood sugars stable continue her Glucotrol, sliding scale and monitor as below. HBA1c stable 6.8 Anxiety/depression: Mood stable.   Hypokalemia resolved. GERD continue PPI. Hypertension: BP fairly controlled, continue  PTA hydralazine, torsemide and metoprolol.  Last echo from 2021 reviewed EF 60 to 65%, G1 DD Normocytic anemia: 9-10g.Monitor  CKD stage IIIa creatinine holding stable. Monitor.   Physical debility/deconditioning: cont PT OT. Plan is for SNF   Goals of care: Discussed by Dr. Lupita Leash regarding her CODE STATUS patient confirmed that she is DNR do not want intubation or CPR.  Old records reviewed in assessment of this patient  Antimicrobials:   DVT prophylaxis:  enoxaparin (LOVENOX) injection 30 mg Start: 02/07/22 1000   Code Status:   Code Status: DNR  Mobility Assessment (last 72 hours)     Mobility Assessment     Row Name 02/13/22 1453 02/13/22 1024 02/12/22 2000 02/12/22 0820 02/11/22 2107   Does patient have an order for bedrest or is patient medically unstable -- -- No - Continue assessment No - Continue assessment No - Continue assessment   What is the highest level of mobility based on the progressive mobility assessment? Level 5 (Walks with assist in room/hall) - Balance while stepping forward/back and can walk in room with assist - Complete Level 5 (Walks with assist in room/hall) - Balance while stepping forward/back and can walk in room with assist - Complete Level 5 (Walks with assist in room/hall) - Balance while stepping forward/back and can walk in room  with assist - Complete Level 5 (Walks with assist in room/hall) - Balance while stepping forward/back and can walk in room with assist - Complete Level 5 (Walks with assist in room/hall) - Balance while stepping forward/back and can walk in room with assist - Complete     Row Name 02/11/22 0954 02/11/22 0836 02/10/22 2035       Does patient have an order for bedrest or is patient medically unstable -- No - Continue assessment No - Continue assessment     What is the highest level of mobility based on the progressive mobility assessment? Level 5 (Walks with assist in room/hall) - Balance while stepping forward/back and can walk in room with assist - Complete Level 5 (Walks with assist in room/hall) - Balance while stepping forward/back and can walk in room with assist - Complete Level 5 (Walks with assist in room/hall) - Balance while stepping forward/back and can walk in room with assist - Complete              Barriers to discharge:  Disposition Plan:  SNF Status is: Inpt  Objective: Blood pressure (!) 106/45, pulse 64, temperature 98 F (36.7 C), temperature source Oral, resp. rate 16, weight 54 kg, SpO2 93 %.  Examination:  Physical Exam Constitutional:      Appearance: Normal appearance.  HENT:     Head: Normocephalic and atraumatic.     Mouth/Throat:     Mouth: Mucous membranes are moist.  Eyes:     Extraocular Movements: Extraocular movements intact.  Cardiovascular:     Rate and Rhythm: Normal rate and regular rhythm.  Pulmonary:     Effort: Pulmonary effort is normal.     Breath sounds: Wheezing and rhonchi present.  Abdominal:     General: Bowel sounds are normal. There is no distension.     Palpations: Abdomen is soft.     Tenderness: There is no abdominal tenderness.  Musculoskeletal:        General: Normal range of motion.     Cervical back: Normal range of motion and neck supple.  Skin:    General: Skin is warm.  Neurological:     General: No focal deficit present.     Mental Status: She is alert.  Psychiatric:        Mood and Affect: Mood normal.      Consultants:    Procedures:    Data Reviewed: Results for orders placed or performed during the hospital encounter of 02/06/22 (from the past 24 hour(s))  Glucose,  capillary     Status: Abnormal   Collection Time: 02/12/22  9:34 PM  Result Value Ref Range   Glucose-Capillary 183 (H) 70 - 99 mg/dL  Glucose, capillary     Status: Abnormal   Collection Time: 02/13/22  8:08 AM  Result Value Ref Range   Glucose-Capillary 104 (H) 70 - 99 mg/dL   Comment 1 Notify RN    Comment 2 Document in Chart   Glucose, capillary     Status: Abnormal   Collection Time: 02/13/22 12:06 PM  Result Value Ref Range   Glucose-Capillary 299 (H) 70 - 99 mg/dL   Comment 1 Notify RN    Comment 2 Document in Chart     I have Reviewed nursing notes, Vitals, and Lab results since pt's last encounter. Pertinent lab results : see above I have ordered test including BMP, CBC, Mg I have reviewed the last note from staff over past 24 hours I have  discussed pt's care plan and test results with nursing staff, case manager   LOS: 6 days   Dwyane Dee, MD [facility] 02/13/2022, 4:47 PM

## 2022-02-14 DIAGNOSIS — J9601 Acute respiratory failure with hypoxia: Secondary | ICD-10-CM | POA: Diagnosis not present

## 2022-02-14 DIAGNOSIS — J441 Chronic obstructive pulmonary disease with (acute) exacerbation: Secondary | ICD-10-CM | POA: Diagnosis not present

## 2022-02-14 LAB — BASIC METABOLIC PANEL
Anion gap: 11 (ref 5–15)
BUN: 52 mg/dL — ABNORMAL HIGH (ref 8–23)
CO2: 31 mmol/L (ref 22–32)
Calcium: 9 mg/dL (ref 8.9–10.3)
Chloride: 95 mmol/L — ABNORMAL LOW (ref 98–111)
Creatinine, Ser: 1.37 mg/dL — ABNORMAL HIGH (ref 0.44–1.00)
GFR, Estimated: 36 mL/min — ABNORMAL LOW (ref >60)
Glucose, Bld: 77 mg/dL (ref 70–99)
Potassium: 3.8 mmol/L (ref 3.5–5.1)
Sodium: 137 mmol/L (ref 135–145)

## 2022-02-14 LAB — GLUCOSE, CAPILLARY
Glucose-Capillary: 95 mg/dL (ref 70–99)
Glucose-Capillary: 175 mg/dL — ABNORMAL HIGH (ref 70–99)
Glucose-Capillary: 274 mg/dL — ABNORMAL HIGH (ref 70–99)
Glucose-Capillary: 220 mg/dL — ABNORMAL HIGH (ref 70–99)
Glucose-Capillary: 155 mg/dL — ABNORMAL HIGH (ref 70–99)

## 2022-02-14 LAB — CBC WITH DIFFERENTIAL/PLATELET
Abs Immature Granulocytes: 0.14 10*3/uL — ABNORMAL HIGH (ref 0.00–0.07)
Basophils Absolute: 0.1 10*3/uL (ref 0.0–0.1)
Basophils Relative: 1 %
Eosinophils Absolute: 0.1 10*3/uL (ref 0.0–0.5)
Eosinophils Relative: 2 %
HCT: 33.4 % — ABNORMAL LOW (ref 36.0–46.0)
Hemoglobin: 11 g/dL — ABNORMAL LOW (ref 12.0–15.0)
Immature Granulocytes: 2 %
Lymphocytes Relative: 24 %
Lymphs Abs: 2.1 10*3/uL (ref 0.7–4.0)
MCH: 28.9 pg (ref 26.0–34.0)
MCHC: 32.9 g/dL (ref 30.0–36.0)
MCV: 87.9 fL (ref 80.0–100.0)
Monocytes Absolute: 1.2 10*3/uL — ABNORMAL HIGH (ref 0.1–1.0)
Monocytes Relative: 13 %
Neutro Abs: 5.2 10*3/uL (ref 1.7–7.7)
Neutrophils Relative %: 58 %
Platelets: 274 10*3/uL (ref 150–400)
RBC: 3.8 MIL/uL — ABNORMAL LOW (ref 3.87–5.11)
RDW: 13.9 % (ref 11.5–15.5)
WBC: 8.8 10*3/uL (ref 4.0–10.5)
nRBC: 0 % (ref 0.0–0.2)

## 2022-02-14 LAB — MAGNESIUM: Magnesium: 2.3 mg/dL (ref 1.7–2.4)

## 2022-02-14 NOTE — Progress Notes (Signed)
Physical Therapy Treatment Patient Details Name: Katie Alvarado MRN: [id number] DOB: Nov 09, 1925 Today's Date: 02/14/2022   History of Present Illness Katie Alvarado is a 88 y.o. female with medical history significant for COPD, bronchiectasis, history of Mycobacterium Avium-intracellulare complex, type 2 diabetes, hypertension, GERD, HFpEF, history of multifocal mucous plugging at the lung bases who presented to [facility] ED due to worsening shortness of breath.  Associated with cough and wheezing.    PT Comments    Patient continues to make steady progress with acute PT but remains limited with tranfers and fatigues quickly. Min assist required to stand and pt needed multiple standing rest breaks due to fatigue during gait and assist to guide walker. Pt has a home aid in the morning but is alone ~4 hours during the day, she currently is not able to stand on her own from a standard chair height. If patient is able to acquire increased assist/support at home she would be able to return home with HHPT. If she is unable to acquire increased assist recommend ST rehab at SNF to improve independence prior to return home. Will continue to progress as able.    Recommendations for follow up therapy are one component of a multi-disciplinary discharge planning process, led by the attending physician.  Recommendations may be updated based on patient status, additional functional criteria and insurance authorization. PT Recommendation   Follow Up Recommendations Skilled nursing-short term rehab (<3 hours/day) Filed 02/14/2022 1217  Can patient physically be transported by private vehicle Yes Filed 02/14/2022 1217  Assistance recommended at discharge Frequent or constant Supervision/Assistance Filed 02/14/2022 1217  Patient can return home with the following A little help with walking and/or transfers, A little help with bathing/dressing/bathroom, Assistance with cooking/housework, Assist for transportation, Help with  stairs or ramp for entrance [facility] 02/14/2022 1217  PT equipment None recommended by PT Filed 02/14/2022 1217    Mobility  Bed Mobility               General bed mobility comments: Pt OOB in recliner    Transfers Overall transfer level: Needs assistance Equipment used: Rolling walker (2 wheels) Transfers: Sit to/from Stand Sit to Stand: Min assist           General transfer comment: pt attempted to stand on her own 2x and unsuccessful with bil UE use, min assist required to fully rise and steady balance.    Ambulation/Gait Ambulation/Gait assistance: Min assist, Min guard Gait Distance (Feet): 200 Feet Assistive device: Rolling walker (2 wheels) Gait Pattern/deviations: Step-through pattern, Decreased step length - right, Decreased step length - left, Decreased stride length, Shuffle, Trunk flexed Gait velocity: decr     General Gait Details: cues required for upright posture during gait and intermittent assist to direct/manage walker. pt's knees flexed in stance phase but not buckling noted. pt taking multiple standing rest breaks due to fatigue and c/o Rt hand pain at times with brace on while using RW.   Stairs             Wheelchair Mobility    Modified Rankin (Stroke Patients Only)       Balance Overall balance assessment: Needs assistance Sitting-balance support: Feet supported Sitting balance-Leahy Scale: Good     Standing balance support: During functional activity, Reliant on assistive device for balance, Bilateral upper extremity supported Standing balance-Leahy Scale: Poor  Cognition Arousal/Alertness: Awake/alert Behavior During Therapy: WFL for tasks assessed/performed Overall Cognitive Status: Within Functional Limits for tasks assessed                                            02/14/22 1217  PT - End of Session  Equipment Utilized During Treatment Gait belt  Activity  Tolerance Patient tolerated treatment well  Patient left in bed;with call bell/phone within reach;with bed alarm set  Nurse Communication Mobility status   PT - Assessment/Plan  PT Plan Current plan remains appropriate  PT Visit Diagnosis Unsteadiness on feet (R26.81);Other abnormalities of gait and mobility (R26.89);Difficulty in walking, not elsewhere classified (R26.2)  PT Frequency (ACUTE ONLY) Min 3X/week  Follow Up Recommendations Skilled nursing-short term rehab (<3 hours/day)  Can patient physically be transported by private vehicle Yes  Assistance recommended at discharge Frequent or constant Supervision/Assistance  Patient can return home with the following A little help with walking and/or transfers;A little help with bathing/dressing/bathroom;Assistance with cooking/housework;Assist for transportation;Help with stairs or ramp for entrance  PT equipment None recommended by PT  AM-PAC PT "6 Clicks" Mobility Outcome Measure (Version 2)  Help needed turning from your back to your side while in a flat bed without using bedrails? 3  Help needed moving from lying on your back to sitting on the side of a flat bed without using bedrails? 3  Help needed moving to and from a bed to a chair (including a wheelchair)? 3  Help needed standing up from a chair using your arms (e.g., wheelchair or bedside chair)? 3  Help needed to walk in hospital room? 3  Help needed climbing 3-5 steps with a railing?  2  6 Click Score 17  Consider Recommendation of Discharge To: Home with [facility]  Progressive Mobility  What is the highest level of mobility based on the progressive mobility assessment? Level 5 (Walks with assist in room/hall) - Balance while stepping forward/back and can walk in room with assist - Complete  Activity Ambulated with assistance in hallway  PT Goal Progression  Progress towards PT goals Progressing toward goals  Acute Rehab PT Goals  PT Goal Formulation With patient  Time For Goal  Achievement 02/21/22  Potential to Achieve Goals Good  PT Time Calculation  PT Start Time (ACUTE ONLY) 1212  PT Stop Time (ACUTE ONLY) 1240  PT Time Calculation (min) (ACUTE ONLY) 28 min  PT General Charges  $$ ACUTE PT VISIT 1 Visit  PT Treatments  $Gait Training 23-37 mins    Verner Mould, DPT Acute Rehabilitation Services Office [phone]  02/14/22 4:56 PM

## 2022-02-14 NOTE — TOC Progression Note (Addendum)
Transition of Care [facility]) - Progression Note    Patient Details  Name: Katie Alvarado MRN: [id number] Date of Birth: [birth year]  Transition of Care [facility]) CM/SW [place], LCSW Phone Number: 02/14/2022, 11:24 AM  Clinical Narrative:    Met with pt to review bed offers. Pt has accepted SNF placement at [facility]. CSW has contacted SNF to confirm acceptance and bed availability.   Update 3:30pm- CSW met with pt and her friend, Fritz Pickerel, at bedside to discuss discharge plans. Pt has personal care aide that stays with her from 8am to 12pm everyday. Pt's friend Fritz Pickerel comes to pt's house at 11:30am everyday and is able to stay with her overnight. Pt also has another friend she plans to have come assist her with cleaning her house every week or two.  Pt is confident she will be able to manage at home. Limitations were discussed in pt being able to get up from her chairs at home. Pt states she does struggle with this as her shoulder and wrist are hurt however, she plans to look for a chair with a higher seat. CSW also discussed investing in a lift chair for further assistance with standing up.  Pt is agreeable to home health services being arranged. TOC currently seeking home health agency.   Expected Discharge Plan: [place] Barriers to Discharge: Continued Medical Work up, SNF Pending bed offer  Expected Discharge Plan and Services Expected Discharge Plan: [place] Choice: [place] arrangements for the past 2 months: Single Family Home Expected Discharge Date: 02/10/22                                     Social Determinants of Health (SDOH) Interventions    Readmission Risk Interventions    02/14/2022   11:23 AM  Readmission Risk Prevention Plan  Transportation Screening Complete  Medication Review (RN Care Manager) Complete  PCP or Specialist appointment within 3-5 days of  discharge Complete  HRI or Home Care Consult Complete  SW Recovery Care/Counseling Consult Complete  Palliative Care Screening Not [place] Complete

## 2022-02-14 NOTE — Progress Notes (Signed)
Progress Note    Katie Alvarado   ZOX:[id number]  DOB: 1925/07/07  DOA: 02/06/2022     7 PCP: [name], MD  Initial CC: cough, SOB  Hospital Course: Ms. Katie Alvarado is a 88 y.o. female with medical history significant for COPD, bronchiectasis, history of Mycobacterium Avium-intracellulare complex (from positive sputum culture on 06/09/2012), type 2 diabetes, hypertension, GERD, HFpEF, history of multifocal mucous plugging at the lung bases (08/30/21), who presented to [facility] ED due to worsening shortness of breath.  Associated with cough and wheezing.  Onset of symptoms about a week PTA. EMS was activated.  Upon EMS arrival she received DuoNebs treatment, IV Solu-Medrol, in route.   In the ED, COVID-19 screening test negative.  No evidence of pneumonia on chest x-ray.  Initially placed on BiPAP, with improvement after treatment with, nebulizer, IV magnesium, and IV steroids via EMS.  She was taken off the BiPAP and placed on 3 L [place] with O2 saturation in the high 90s.   ED Course: Tmax 99.1.  BP 111/53, pulse 59, respiratory rate 15, O2 saturation 97% on 3 L.  Lab studies remarkable for serum potassium 3.4, glucose 214, BUN 32, creatinine 1.10.  GFR 47.  Baseline creatinine 0.8 with GFR greater than 60.  BNP 154.Hemoglobin 10.7 at baseline.  WBC 7.3.  Platelet count 220. Managed with systemic steroids,IV antibiotics antitussives supplemental oxygen.   Interval History:  No events overnight. Breathing remains comfortable. Right wrist in brace today, she's still getting used to it.  Re-evaluated by PT, given that she's still alone some during the day and can't stand on her own from chair height, she's still felt safer for rehab at least for a little until more independent and stronger again.   Assessment and Plan:  Acute COPD exacerbation Mild to moderate bronchiectasis exacerbation Acute hypoxic respiratory failure History of bronchiectasis, most recent sputum culture revealed normal  respiratory flora no staph or Pseudomonas.sputum culture ordered -but not done as no sputum for culture yet.  Clinically much improved, continue with empiric Unasyn, oral prednisone> tapering to 30 mg> 20 mg, continue, bronchodilators antitussives, saline nebulizer.   T2DM with uncontrolled hyperglycemia: Blood sugars stable continue her Glucotrol, sliding scale and monitor as below. HBA1c stable 6.8 Anxiety/depression: Mood stable.   Hypokalemia resolved. GERD continue PPI. Hypertension: BP fairly controlled, continue  PTA hydralazine, torsemide and metoprolol.  Last echo from 2021 reviewed EF 60 to 65%, G1 DD Normocytic anemia: 9-10g.Monitor  CKD stage IIIa creatinine holding stable. Monitor.   Physical debility/deconditioning: cont PT OT. Plan is for SNF   Goals of care: Discussed by Dr. Lupita Leash regarding her CODE STATUS patient confirmed that she is DNR do not want intubation or CPR.  Old records reviewed in assessment of this patient  Antimicrobials:   DVT prophylaxis:  enoxaparin (LOVENOX) injection 30 mg Start: 02/07/22 1000   Code Status:   Code Status: DNR  Mobility Assessment (last 72 hours)     Mobility Assessment     Row Name 02/14/22 1217 02/13/22 2026 02/13/22 1453 02/13/22 1024 02/12/22 2000   Does patient have an order for bedrest or is patient medically unstable -- No - Continue assessment -- -- No - Continue assessment   What is the highest level of mobility based on the progressive mobility assessment? Level 5 (Walks with assist in room/hall) - Balance while stepping forward/back and can walk in room with assist - Complete Level 4 (Walks with assist in room) - Balance while marching in  place and cannot step forward and back - Complete Level 5 (Walks with assist in room/hall) - Balance while stepping forward/back and can walk in room with assist - Complete Level 5 (Walks with assist in room/hall) - Balance while stepping forward/back and can walk in room with assist -  Complete Level 5 (Walks with assist in room/hall) - Balance while stepping forward/back and can walk in room with assist - Complete    Row Name 02/12/22 0820 02/11/22 2107         Does patient have an order for bedrest or is patient medically unstable No - Continue assessment No - Continue assessment      What is the highest level of mobility based on the progressive mobility assessment? Level 5 (Walks with assist in room/hall) - Balance while stepping forward/back and can walk in room with assist - Complete Level 5 (Walks with assist in room/hall) - Balance while stepping forward/back and can walk in room with assist - Complete               Barriers to discharge:  Disposition Plan:  SNF Status is: Inpt  Objective: Blood pressure (!) 116/55, pulse 69, temperature 97.9 F (36.6 C), temperature source Oral, resp. rate 16, weight 54 kg, SpO2 96 %.  Examination:  Physical Exam Constitutional:      Appearance: Normal appearance.  HENT:     Head: Normocephalic and atraumatic.     Mouth/Throat:     Mouth: Mucous membranes are moist.  Eyes:     Extraocular Movements: Extraocular movements intact.  Cardiovascular:     Rate and Rhythm: Normal rate and regular rhythm.  Pulmonary:     Effort: Pulmonary effort is normal.     Breath sounds: Wheezing and rhonchi present.  Abdominal:     General: Bowel sounds are normal. There is no distension.     Palpations: Abdomen is soft.     Tenderness: There is no abdominal tenderness.  Musculoskeletal:        General: Normal range of motion.     Cervical back: Normal range of motion and neck supple.  Skin:    General: Skin is warm.  Neurological:     General: No focal deficit present.     Mental Status: She is alert.  Psychiatric:        Mood and Affect: Mood normal.      Consultants:    Procedures:    Data Reviewed: Results for orders placed or performed during the hospital encounter of 02/06/22 (from the past 24 hour(s))   Glucose, capillary     Status: Abnormal   Collection Time: 02/13/22  9:23 PM  Result Value Ref Range   Glucose-Capillary 192 (H) 70 - 99 mg/dL  CBC with Differential/Platelet     Status: Abnormal   Collection Time: 02/14/22  4:51 AM  Result Value Ref Range   WBC 8.8 4.0 - 10.5 K/uL   RBC 3.80 (L) 3.87 - 5.11 MIL/uL   Hemoglobin 11.0 (L) 12.0 - 15.0 g/dL   HCT 33.4 (L) 36.0 - 46.0 %   MCV 87.9 80.0 - 100.0 fL   MCH 28.9 26.0 - 34.0 pg   MCHC 32.9 30.0 - 36.0 g/dL   RDW 13.9 11.5 - 15.5 %   Platelets 274 150 - 400 K/uL   nRBC 0.0 0.0 - 0.2 %   Neutrophils Relative % 58 %   Neutro Abs 5.2 1.7 - 7.7 K/uL   Lymphocytes Relative 24 %  Lymphs Abs 2.1 0.7 - 4.0 K/uL   Monocytes Relative 13 %   Monocytes Absolute 1.2 (H) 0.1 - 1.0 K/uL   Eosinophils Relative 2 %   Eosinophils Absolute 0.1 0.0 - 0.5 K/uL   Basophils Relative 1 %   Basophils Absolute 0.1 0.0 - 0.1 K/uL   Immature Granulocytes 2 %   Abs Immature Granulocytes 0.14 (H) 0.00 - 0.07 K/uL  Basic metabolic panel     Status: Abnormal   Collection Time: 02/14/22  4:51 AM  Result Value Ref Range   Sodium 137 135 - 145 mmol/L   Potassium 3.8 3.5 - 5.1 mmol/L   Chloride 95 (L) 98 - 111 mmol/L   CO2 31 22 - 32 mmol/L   Glucose, Bld 77 70 - 99 mg/dL   BUN 52 (H) 8 - 23 mg/dL   Creatinine, Ser 1.37 (H) 0.44 - 1.00 mg/dL   Calcium 9.0 8.9 - 10.3 mg/dL   GFR, Estimated 36 (L) >60 mL/min   Anion gap 11 5 - 15  Magnesium     Status: None   Collection Time: 02/14/22  4:51 AM  Result Value Ref Range   Magnesium 2.3 1.7 - 2.4 mg/dL  Glucose, capillary     Status: None   Collection Time: 02/14/22  7:34 AM  Result Value Ref Range   Glucose-Capillary 95 70 - 99 mg/dL  Glucose, capillary     Status: Abnormal   Collection Time: 02/14/22 12:00 PM  Result Value Ref Range   Glucose-Capillary 175 (H) 70 - 99 mg/dL  Glucose, capillary     Status: Abnormal   Collection Time: 02/14/22  5:40 PM  Result Value Ref Range    Glucose-Capillary 274 (H) 70 - 99 mg/dL    I have Reviewed nursing notes, Vitals, and Lab results since pt's last encounter. Pertinent lab results : see above I have ordered test including BMP, CBC, Mg I have reviewed the last note from staff over past 24 hours I have discussed pt's care plan and test results with nursing staff, case manager   LOS: 7 days   Dwyane Dee, MD [facility] 02/14/2022, 6:15 PM

## 2022-02-15 DIAGNOSIS — J441 Chronic obstructive pulmonary disease with (acute) exacerbation: Secondary | ICD-10-CM | POA: Diagnosis not present

## 2022-02-15 DIAGNOSIS — J9601 Acute respiratory failure with hypoxia: Secondary | ICD-10-CM | POA: Diagnosis not present

## 2022-02-15 LAB — GLUCOSE, CAPILLARY
Glucose-Capillary: 89 mg/dL (ref 70–99)
Glucose-Capillary: 133 mg/dL — ABNORMAL HIGH (ref 70–99)
Glucose-Capillary: 311 mg/dL — ABNORMAL HIGH (ref 70–99)
Glucose-Capillary: 129 mg/dL — ABNORMAL HIGH (ref 70–99)

## 2022-02-15 MED ORDER — MELATONIN 3 MG PO TABS
3.0000 mg | ORAL_TABLET | Freq: Once | ORAL | Status: AC
  Administered 2022-02-15: 3 mg via ORAL
  Filled 2022-02-15: qty 1

## 2022-02-15 MED ORDER — ACETAMINOPHEN 325 MG PO TABS
650.0000 mg | ORAL_TABLET | Freq: Four times a day (QID) | ORAL | Status: AC | PRN
  Administered 2022-02-15 – 2022-02-16 (×3): 650 mg via ORAL
  Filled 2022-02-15 (×3): qty 2

## 2022-02-15 NOTE — Care Management Important Message (Signed)
Important Message  Patient Details IM Letter given Name: Katie Alvarado MRN: [id number] Date of Birth: 1925-11-23   Medicare Important Message Given:  Yes     Kerin Salen 02/15/2022, 11:47 AM

## 2022-02-15 NOTE — Progress Notes (Signed)
Progress Note    LASHONA SCHAAF   VOH:[id number]  DOB: 1925/08/22  DOA: 02/06/2022     8 PCP: [name], MD  Initial CC: cough, SOB  Hospital Course: Ms. Gingras is a 88 y.o. female with medical history significant for COPD, bronchiectasis, history of Mycobacterium Avium-intracellulare complex (from positive sputum culture on 06/09/2012), type 2 diabetes, hypertension, GERD, HFpEF, history of multifocal mucous plugging at the lung bases (08/30/21), who presented to [facility] ED due to worsening shortness of breath.  Associated with cough and wheezing.  Onset of symptoms about a week PTA. EMS was activated.  Upon EMS arrival she received DuoNebs treatment, IV Solu-Medrol, in route.   In the ED, COVID-19 screening test negative.  No evidence of pneumonia on chest x-ray.  Initially placed on BiPAP, with improvement after treatment with, nebulizer, IV magnesium, and IV steroids via EMS.  She was taken off the BiPAP and placed on 3 L [place] with O2 saturation in the high 90s.   ED Course: Tmax 99.1.  BP 111/53, pulse 59, respiratory rate 15, O2 saturation 97% on 3 L.  Lab studies remarkable for serum potassium 3.4, glucose 214, BUN 32, creatinine 1.10.  GFR 47.  Baseline creatinine 0.8 with GFR greater than 60.  BNP 154.Hemoglobin 10.7 at baseline.  WBC 7.3.  Platelet count 220. Managed with systemic steroids,IV antibiotics antitussives supplemental oxygen.   Interval History:  Patient very clearly wishing to go home instead of to rehab.  She feels that she will have the necessary help at home and feels safe with her plan.  She also has family coming into town on Saturday. She respectfully declines rehab and endorsed wish for going home. Home health will be arranged and we will plan for discharge on Saturday when further family arrives.  Assessment and Plan:  Acute COPD exacerbation Mild to moderate bronchiectasis exacerbation Acute hypoxic respiratory failure History of bronchiectasis, most  recent sputum culture revealed normal respiratory flora no staph or Pseudomonas.sputum culture ordered -but not done as no sputum for culture yet.   -Completed antibiotic course - Continue prednisone   T2DM with uncontrolled hyperglycemia: Blood sugars stable continue her Glucotrol, sliding scale and monitor as below. HBA1c stable 6.8 Anxiety/depression: Mood stable.   Hypokalemia resolved. GERD continue PPI. Hypertension: BP fairly controlled, continue  PTA hydralazine, torsemide and metoprolol.  Last echo from 2021 reviewed EF 60 to 65%, G1 DD Normocytic anemia: 9-10g.Monitor  CKD stage IIIa creatinine holding stable. Monitor.   Physical debility/deconditioning: cont PT OT. Plan is for SNF   Goals of care: Discussed by Dr. Lupita Leash regarding her CODE STATUS patient confirmed that she is DNR do not want intubation or CPR.  Old records reviewed in assessment of this patient  Antimicrobials:   DVT prophylaxis:  enoxaparin (LOVENOX) injection 30 mg Start: 02/07/22 1000   Code Status:   Code Status: DNR  Mobility Assessment (last 72 hours)     Mobility Assessment     Row Name 02/15/22 0915 02/14/22 2100 02/14/22 1217 02/13/22 2026 02/13/22 1453   Does patient have an order for bedrest or is patient medically unstable No - Continue assessment No - Continue assessment -- No - Continue assessment --   What is the highest level of mobility based on the progressive mobility assessment? Level 5 (Walks with assist in room/hall) - Balance while stepping forward/back and can walk in room with assist - Complete Level 5 (Walks with assist in room/hall) - Balance while stepping forward/back and  can walk in room with assist - Complete Level 5 (Walks with assist in room/hall) - Balance while stepping forward/back and can walk in room with assist - Complete Level 4 (Walks with assist in room) - Balance while marching in place and cannot step forward and back - Complete Level 5 (Walks with assist in  room/hall) - Balance while stepping forward/back and can walk in room with assist - Complete    Row Name 02/13/22 1024 02/12/22 2000         Does patient have an order for bedrest or is patient medically unstable -- No - Continue assessment      What is the highest level of mobility based on the progressive mobility assessment? Level 5 (Walks with assist in room/hall) - Balance while stepping forward/back and can walk in room with assist - Complete Level 5 (Walks with assist in room/hall) - Balance while stepping forward/back and can walk in room with assist - Complete               Barriers to discharge:  Disposition Plan:  SNF Status is: Inpt  Objective: Blood pressure (!) 101/50, pulse 77, temperature 97.9 F (36.6 C), temperature source Oral, resp. rate 18, weight 54 kg, SpO2 94 %.  Examination:  Physical Exam Constitutional:      Appearance: Normal appearance.  HENT:     Head: Normocephalic and atraumatic.     Mouth/Throat:     Mouth: Mucous membranes are moist.  Eyes:     Extraocular Movements: Extraocular movements intact.  Cardiovascular:     Rate and Rhythm: Normal rate and regular rhythm.  Pulmonary:     Effort: Pulmonary effort is normal.     Breath sounds: Wheezing and rhonchi present.  Abdominal:     General: Bowel sounds are normal. There is no distension.     Palpations: Abdomen is soft.     Tenderness: There is no abdominal tenderness.  Musculoskeletal:        General: Normal range of motion.     Cervical back: Normal range of motion and neck supple.  Skin:    General: Skin is warm.  Neurological:     General: No focal deficit present.     Mental Status: She is alert.  Psychiatric:        Mood and Affect: Mood normal.      Consultants:    Procedures:    Data Reviewed: Results for orders placed or performed during the hospital encounter of 02/06/22 (from the past 24 hour(s))  Glucose, capillary     Status: Abnormal   Collection Time:  02/14/22  5:40 PM  Result Value Ref Range   Glucose-Capillary 274 (H) 70 - 99 mg/dL  Glucose, capillary     Status: Abnormal   Collection Time: 02/14/22  7:25 PM  Result Value Ref Range   Glucose-Capillary 220 (H) 70 - 99 mg/dL  Glucose, capillary     Status: Abnormal   Collection Time: 02/14/22  8:59 PM  Result Value Ref Range   Glucose-Capillary 155 (H) 70 - 99 mg/dL   Comment 1 Repeat Test   Glucose, capillary     Status: None   Collection Time: 02/15/22  7:31 AM  Result Value Ref Range   Glucose-Capillary 89 70 - 99 mg/dL  Glucose, capillary     Status: Abnormal   Collection Time: 02/15/22 11:55 AM  Result Value Ref Range   Glucose-Capillary 133 (H) 70 - 99 mg/dL  Glucose, capillary  Status: Abnormal   Collection Time: 02/15/22  4:18 PM  Result Value Ref Range   Glucose-Capillary 311 (H) 70 - 99 mg/dL    I have Reviewed nursing notes, Vitals, and Lab results since pt's last encounter. Pertinent lab results : see above I have ordered test including BMP, CBC, Mg I have reviewed the last note from staff over past 24 hours I have discussed pt's care plan and test results with nursing staff, case manager   LOS: 8 days   Dwyane Dee, MD [facility] 02/15/2022, 4:52 PM

## 2022-02-15 NOTE — TOC Progression Note (Signed)
Transition of Care [facility]) - Progression Note    Patient Details  Name: TESA MEADORS MRN: [id number] Date of Birth: 1925-12-18  Transition of Care [facility]) CM/SW [place], [place] Phone Number: 02/15/2022, 8:59 AM  Clinical Narrative:    Pt is to have 24/7 care at home between her PCA and friend, Fritz Pickerel. Home health has been arranged for PT and OT with [facility]. Home health orders will need to be placed prior to discharge.    Expected Discharge Plan: [place] Barriers to Discharge: Continued Medical Work up, SNF Pending bed offer  Expected Discharge Plan and Services Expected Discharge Plan: [place] Choice: [place] arrangements for the past 2 months: Single Family Home Expected Discharge Date: 02/10/22                         HH Arranged: PT, OT HH Agency: [facility] Date [place]: 02/15/22 Time [place]: 1630 Representative spoke with at [place]: [place] ([place]) Interventions    Readmission Risk Interventions    02/14/2022   11:23 AM  Readmission Risk Prevention Plan  Transportation Screening Complete  Medication Review Press photographer) Complete  PCP or Specialist appointment within 3-5 days of discharge Complete  HRI or Home Care Consult Complete  SW Recovery Care/Counseling Consult Complete  Palliative Care Screening Not [place] Complete

## 2022-02-16 DIAGNOSIS — J441 Chronic obstructive pulmonary disease with (acute) exacerbation: Secondary | ICD-10-CM | POA: Diagnosis not present

## 2022-02-16 DIAGNOSIS — J9601 Acute respiratory failure with hypoxia: Secondary | ICD-10-CM | POA: Diagnosis not present

## 2022-02-16 LAB — GLUCOSE, CAPILLARY
Glucose-Capillary: 62 mg/dL — ABNORMAL LOW (ref 70–99)
Glucose-Capillary: 68 mg/dL — ABNORMAL LOW (ref 70–99)
Glucose-Capillary: 136 mg/dL — ABNORMAL HIGH (ref 70–99)
Glucose-Capillary: 288 mg/dL — ABNORMAL HIGH (ref 70–99)
Glucose-Capillary: 191 mg/dL — ABNORMAL HIGH (ref 70–99)

## 2022-02-16 NOTE — Progress Notes (Signed)
Progress Note    Katie Alvarado   ZOX:[id number]  DOB: 05-Oct-1925  DOA: 02/06/2022     9 PCP: Katie Cruel, MD  Initial CC: cough, SOB  Hospital Course: Katie Alvarado is a 88 y.o. female with medical history significant for COPD, bronchiectasis, history of Mycobacterium Avium-intracellulare complex (from positive sputum culture on 06/09/2012), type 2 diabetes, hypertension, GERD, HFpEF, history of multifocal mucous plugging at the lung bases (08/30/21), who presented to [facility] ED due to worsening shortness of breath.  Associated with cough and wheezing.  Onset of symptoms about a week PTA. EMS was activated.  Upon EMS arrival she received DuoNebs treatment, IV Solu-Medrol, in route.   In the ED, COVID-19 screening test negative.  No evidence of pneumonia on chest x-ray.  Initially placed on BiPAP, with improvement after treatment with, nebulizer, IV magnesium, and IV steroids via EMS.  She was taken off the BiPAP and placed on 3 L [place] with O2 saturation in the high 90s.   ED Course: Tmax 99.1.  BP 111/53, pulse 59, respiratory rate 15, O2 saturation 97% on 3 L.  Lab studies remarkable for serum potassium 3.4, glucose 214, BUN 32, creatinine 1.10.  GFR 47.  Baseline creatinine 0.8 with GFR greater than 60.  BNP 154.Hemoglobin 10.7 at baseline.  WBC 7.3.  Platelet count 220. Managed with systemic steroids,IV antibiotics antitussives supplemental oxygen.   Interval History:  Patient had changed her mind this morning and now wanting to go to rehab.  Because of pain with her right thumb she feels that she will have difficulty holding her walker and ambulating safely. [facility] is able to accept patient for admission on Monday.  Assessment and Plan:  Acute COPD exacerbation Mild to moderate bronchiectasis exacerbation Acute hypoxic respiratory failure History of bronchiectasis, most recent sputum culture revealed normal respiratory flora -Completed antibiotic course - Continue prednisone    T2DM with uncontrolled hyperglycemia: Blood sugars stable continue her Glucotrol, sliding scale and monitor as below. HBA1c stable 6.8 Anxiety/depression: Mood stable.   Hypokalemia resolved. GERD continue PPI. Hypertension: BP fairly controlled, continue  PTA hydralazine, torsemide and metoprolol.  Last echo from 2021 reviewed EF 60 to 65%, G1 DD Normocytic anemia: 9-10g.Monitor  CKD stage IIIa creatinine holding stable. Monitor.   Physical debility/deconditioning: cont PT OT. Plan is for SNF   Goals of care: Discussed by Dr. Lupita Alvarado regarding her CODE STATUS patient confirmed that she is DNR do not want intubation or CPR.  Old records reviewed in assessment of this patient  Antimicrobials:   DVT prophylaxis:  enoxaparin (LOVENOX) injection 30 mg Start: 02/07/22 1000   Code Status:   Code Status: DNR  Mobility Assessment (last 72 hours)     Mobility Assessment     Row Name 02/15/22 0915 02/14/22 2100 02/14/22 1217 02/13/22 2026 02/13/22 1453   Does patient have an order for bedrest or is patient medically unstable No - Continue assessment No - Continue assessment -- No - Continue assessment --   What is the highest level of mobility based on the progressive mobility assessment? Level 5 (Walks with assist in room/hall) - Balance while stepping forward/back and can walk in room with assist - Complete Level 5 (Walks with assist in room/hall) - Balance while stepping forward/back and can walk in room with assist - Complete Level 5 (Walks with assist in room/hall) - Balance while stepping forward/back and can walk in room with assist - Complete Level 4 (Walks with assist in room) - Balance  while marching in place and cannot step forward and back - Complete Level 5 (Walks with assist in room/hall) - Balance while stepping forward/back and can walk in room with assist - Complete            Barriers to discharge:  Disposition Plan:  SNF - Monday  Status is: Inpt  Objective: Blood  pressure 110/62, pulse 72, temperature 98.2 F (36.8 C), temperature source Oral, resp. rate 20, weight 54 kg, SpO2 97 %.  Examination:  Physical Exam Constitutional:      Appearance: Normal appearance.  HENT:     Head: Normocephalic and atraumatic.     Mouth/Throat:     Mouth: Mucous membranes are moist.  Eyes:     Extraocular Movements: Extraocular movements intact.  Cardiovascular:     Rate and Rhythm: Normal rate and regular rhythm.  Pulmonary:     Effort: Pulmonary effort is normal.     Breath sounds: Wheezing and rhonchi present.  Abdominal:     General: Bowel sounds are normal. There is no distension.     Palpations: Abdomen is soft.     Tenderness: There is no abdominal tenderness.  Musculoskeletal:        General: Normal range of motion.     Cervical back: Normal range of motion and neck supple.  Skin:    General: Skin is warm.  Neurological:     General: No focal deficit present.     Mental Status: She is alert.  Psychiatric:        Mood and Affect: Mood normal.      Consultants:    Procedures:    Data Reviewed: Results for orders placed or performed during the hospital encounter of 02/06/22 (from the past 24 hour(s))  Glucose, capillary     Status: Abnormal   Collection Time: 02/15/22  4:18 PM  Result Value Ref Range   Glucose-Capillary 311 (H) 70 - 99 mg/dL  Glucose, capillary     Status: Abnormal   Collection Time: 02/15/22  9:30 PM  Result Value Ref Range   Glucose-Capillary 129 (H) 70 - 99 mg/dL  Glucose, capillary     Status: Abnormal   Collection Time: 02/16/22  7:59 AM  Result Value Ref Range   Glucose-Capillary 62 (L) 70 - 99 mg/dL  Glucose, capillary     Status: Abnormal   Collection Time: 02/16/22  8:21 AM  Result Value Ref Range   Glucose-Capillary 68 (L) 70 - 99 mg/dL  Glucose, capillary     Status: Abnormal   Collection Time: 02/16/22 12:08 PM  Result Value Ref Range   Glucose-Capillary 136 (H) 70 - 99 mg/dL    I have Reviewed  nursing notes, Vitals, and Lab results since pt's last encounter. Pertinent lab results : see above I have ordered test including BMP, CBC, Mg I have reviewed the last note from staff over past 24 hours I have discussed pt's care plan and test results with nursing staff, case manager   LOS: 9 days   Dwyane Dee, MD [facility] 02/16/2022, 1:39 PM

## 2022-02-16 NOTE — Progress Notes (Addendum)
This CSW was contacted via secure chat by Dr. Sabino Gasser to inform that the pt is now wanting SNF. This CSW attempted to contact [facility]'s Admissions Director, Loman [place], to inquire about an available bed. TOC following.   Addend @ 10:44 AM This CSW attempted to contact [facility] without success. No answer. Will try again. Have not rec'd a call back from Admissions Director yet.   Addend @ 10:59 AM Almyra Free from [facility] called and stated the pt can admit to the facility on Monday. Almyra Free states this decision came from Admissions Director, [place]. Almyra Free reported it would be best for new admissions to come in Monday. This CSW informed the pt's friend, Fritz Pickerel.

## 2022-02-17 DIAGNOSIS — J441 Chronic obstructive pulmonary disease with (acute) exacerbation: Secondary | ICD-10-CM | POA: Diagnosis not present

## 2022-02-17 DIAGNOSIS — J9601 Acute respiratory failure with hypoxia: Secondary | ICD-10-CM | POA: Diagnosis not present

## 2022-02-17 LAB — GLUCOSE, CAPILLARY
Glucose-Capillary: 77 mg/dL (ref 70–99)
Glucose-Capillary: 98 mg/dL (ref 70–99)
Glucose-Capillary: 293 mg/dL — ABNORMAL HIGH (ref 70–99)
Glucose-Capillary: 253 mg/dL — ABNORMAL HIGH (ref 70–99)

## 2022-02-17 MED ORDER — SENNOSIDES-DOCUSATE SODIUM 8.6-50 MG PO TABS
1.0000 | ORAL_TABLET | Freq: Two times a day (BID) | ORAL | Status: DC
  Administered 2022-02-17 – 2022-02-18 (×3): 1 via ORAL
  Filled 2022-02-17 (×3): qty 1

## 2022-02-17 MED ORDER — LACTULOSE 10 GM/15ML PO SOLN
30.0000 g | Freq: Once | ORAL | Status: AC
  Administered 2022-02-17: 30 g via ORAL
  Filled 2022-02-17: qty 45

## 2022-02-17 MED ORDER — POLYETHYLENE GLYCOL 3350 17 G PO PACK
17.0000 g | PACK | Freq: Every day | ORAL | Status: DC
  Administered 2022-02-17 – 2022-02-18 (×2): 17 g via ORAL
  Filled 2022-02-17 (×2): qty 1

## 2022-02-17 MED ORDER — ACETAMINOPHEN 325 MG PO TABS
650.0000 mg | ORAL_TABLET | ORAL | Status: DC | PRN
  Administered 2022-02-17 (×2): 650 mg via ORAL
  Filled 2022-02-17 (×2): qty 2

## 2022-02-17 NOTE — Progress Notes (Signed)
Progress Note    Katie Alvarado   JAS:[id number]  DOB: 12/01/1925  DOA: 02/06/2022     10 PCP: [name], MD  Initial CC: cough, SOB  Hospital Course: Katie Alvarado is a 88 y.o. female with medical history significant for COPD, bronchiectasis, history of Mycobacterium Avium-intracellulare complex (from positive sputum culture on 06/09/2012), type 2 diabetes, hypertension, GERD, HFpEF, history of multifocal mucous plugging at the lung bases (08/30/21), who presented to [facility] ED due to worsening shortness of breath.  Associated with cough and wheezing.  Onset of symptoms about a week PTA. EMS was activated.  Upon EMS arrival she received DuoNebs treatment, IV Solu-Medrol, in route.   In the ED, COVID-19 screening test negative.  No evidence of pneumonia on chest x-ray.  Initially placed on BiPAP, with improvement after treatment with, nebulizer, IV magnesium, and IV steroids via EMS.  She was taken off the BiPAP and placed on 3 L [place] with O2 saturation in the high 90s.   ED Course: Tmax 99.1.  BP 111/53, pulse 59, respiratory rate 15, O2 saturation 97% on 3 L.  Lab studies remarkable for serum potassium 3.4, glucose 214, BUN 32, creatinine 1.10.  GFR 47.  Baseline creatinine 0.8 with GFR greater than 60.  BNP 154.Hemoglobin 10.7 at baseline.  WBC 7.3.  Platelet count 220. Managed with systemic steroids,IV antibiotics antitussives supplemental oxygen.   Interval History:  No events overnight.  Pains are improved today.  Breathing status also comfortable.  [facility].  Assessment and Plan:  Acute COPD exacerbation Mild to moderate bronchiectasis exacerbation Acute hypoxic respiratory failure History of bronchiectasis, most recent sputum culture revealed normal respiratory flora -Completed antibiotic course - Continue prednisone  Osteoarthritis - Continue brace on right thumb as needed   T2DM with uncontrolled hyperglycemia: Blood  sugars stable continue her Glucotrol, sliding scale and monitor as below. HBA1c stable 6.8 Anxiety/depression: Mood stable.   Hypokalemia resolved. GERD continue PPI. Hypertension: BP fairly controlled, continue  PTA hydralazine, torsemide and metoprolol.  Last echo from 2021 reviewed EF 60 to 65%, G1 DD Normocytic anemia: 9-10g.Monitor  CKD stage IIIa creatinine holding stable. Monitor.   Physical debility/deconditioning: cont PT OT. Plan is for SNF   Goals of care: Discussed by Dr. Lupita Leash regarding her CODE STATUS patient confirmed that she is DNR do not want intubation or CPR.  Old records reviewed in assessment of this patient  Antimicrobials:   DVT prophylaxis:  enoxaparin (LOVENOX) injection 30 mg Start: 02/07/22 1000   Code Status:   Code Status: DNR  Mobility Assessment (last 72 hours)     Mobility Assessment     Row Name 02/16/22 2205 02/15/22 0915 02/14/22 2100       Does patient have an order for bedrest or is patient medically unstable No - Continue assessment No - Continue assessment No - Continue assessment     What is the highest level of mobility based on the progressive mobility assessment? Level 5 (Walks with assist in room/hall) - Balance while stepping forward/back and can walk in room with assist - Complete Level 5 (Walks with assist in room/hall) - Balance while stepping forward/back and can walk in room with assist - Complete Level 5 (Walks with assist in room/hall) - Balance while stepping forward/back and can walk in room with assist - Complete              Barriers to discharge:  Disposition Plan:  SNF - Monday  Status is: Inpt  Objective: Blood pressure (!) 126/58, pulse 63, temperature 97.8 F (36.6 C), temperature source Oral, resp. rate 18, weight 54 kg, SpO2 94 %.  Examination:  Physical Exam Constitutional:      Appearance: Normal appearance.  HENT:     Head: Normocephalic and atraumatic.     Mouth/Throat:     Mouth: Mucous membranes  are moist.  Eyes:     Extraocular Movements: Extraocular movements intact.  Cardiovascular:     Rate and Rhythm: Normal rate and regular rhythm.  Pulmonary:     Effort: Pulmonary effort is normal.     Breath sounds: Wheezing and rhonchi present.  Abdominal:     General: Bowel sounds are normal. There is no distension.     Palpations: Abdomen is soft.     Tenderness: There is no abdominal tenderness.  Musculoskeletal:        General: Normal range of motion.     Cervical back: Normal range of motion and neck supple.  Skin:    General: Skin is warm.  Neurological:     General: No focal deficit present.     Mental Status: She is alert.  Psychiatric:        Mood and Affect: Mood normal.      Consultants:    Procedures:    Data Reviewed: Results for orders placed or performed during the hospital encounter of 02/06/22 (from the past 24 hour(s))  Glucose, capillary     Status: Abnormal   Collection Time: 02/16/22  4:17 PM  Result Value Ref Range   Glucose-Capillary 288 (H) 70 - 99 mg/dL  Glucose, capillary     Status: Abnormal   Collection Time: 02/16/22  8:36 PM  Result Value Ref Range   Glucose-Capillary 191 (H) 70 - 99 mg/dL  Glucose, capillary     Status: None   Collection Time: 02/17/22  7:30 AM  Result Value Ref Range   Glucose-Capillary 77 70 - 99 mg/dL  Glucose, capillary     Status: None   Collection Time: 02/17/22 11:45 AM  Result Value Ref Range   Glucose-Capillary 98 70 - 99 mg/dL    I have Reviewed nursing notes, Vitals, and Lab results since pt's last encounter. Pertinent lab results : see above I have reviewed the last note from staff over past 24 hours I have discussed pt's care plan and test results with nursing staff, case manager   LOS: 10 days   Dwyane Dee, MD [facility] 02/17/2022, 3:21 PM

## 2022-02-18 ENCOUNTER — Encounter (HOSPITAL_COMMUNITY): Payer: Self-pay | Admitting: Internal Medicine

## 2022-02-18 DIAGNOSIS — J441 Chronic obstructive pulmonary disease with (acute) exacerbation: Secondary | ICD-10-CM | POA: Diagnosis not present

## 2022-02-18 DIAGNOSIS — D649 Anemia, unspecified: Secondary | ICD-10-CM | POA: Diagnosis not present

## 2022-02-18 DIAGNOSIS — J440 Chronic obstructive pulmonary disease with acute lower respiratory infection: Secondary | ICD-10-CM | POA: Diagnosis not present

## 2022-02-18 DIAGNOSIS — Z9981 Dependence on supplemental oxygen: Secondary | ICD-10-CM | POA: Diagnosis not present

## 2022-02-18 DIAGNOSIS — H548 Legal blindness, as defined in USA: Secondary | ICD-10-CM | POA: Diagnosis not present

## 2022-02-18 DIAGNOSIS — J310 Chronic rhinitis: Secondary | ICD-10-CM | POA: Diagnosis not present

## 2022-02-18 DIAGNOSIS — J471 Bronchiectasis with (acute) exacerbation: Secondary | ICD-10-CM | POA: Diagnosis not present

## 2022-02-18 DIAGNOSIS — R0602 Shortness of breath: Secondary | ICD-10-CM | POA: Diagnosis not present

## 2022-02-18 DIAGNOSIS — Z23 Encounter for immunization: Secondary | ICD-10-CM | POA: Diagnosis not present

## 2022-02-18 DIAGNOSIS — R2681 Unsteadiness on feet: Secondary | ICD-10-CM | POA: Diagnosis not present

## 2022-02-18 DIAGNOSIS — Z743 Need for continuous supervision: Secondary | ICD-10-CM | POA: Diagnosis not present

## 2022-02-18 DIAGNOSIS — Z681 Body mass index (BMI) 19 or less, adult: Secondary | ICD-10-CM | POA: Diagnosis not present

## 2022-02-18 DIAGNOSIS — I959 Hypotension, unspecified: Secondary | ICD-10-CM | POA: Diagnosis not present

## 2022-02-18 DIAGNOSIS — R531 Weakness: Secondary | ICD-10-CM | POA: Diagnosis not present

## 2022-02-18 DIAGNOSIS — J470 Bronchiectasis with acute lower respiratory infection: Secondary | ICD-10-CM | POA: Diagnosis not present

## 2022-02-18 DIAGNOSIS — Z85828 Personal history of other malignant neoplasm of skin: Secondary | ICD-10-CM | POA: Diagnosis not present

## 2022-02-18 DIAGNOSIS — J189 Pneumonia, unspecified organism: Secondary | ICD-10-CM | POA: Diagnosis not present

## 2022-02-18 DIAGNOSIS — R41841 Cognitive communication deficit: Secondary | ICD-10-CM | POA: Diagnosis not present

## 2022-02-18 DIAGNOSIS — Z7984 Long term (current) use of oral hypoglycemic drugs: Secondary | ICD-10-CM | POA: Diagnosis not present

## 2022-02-18 DIAGNOSIS — F411 Generalized anxiety disorder: Secondary | ICD-10-CM | POA: Diagnosis not present

## 2022-02-18 DIAGNOSIS — Z923 Personal history of irradiation: Secondary | ICD-10-CM | POA: Diagnosis not present

## 2022-02-18 DIAGNOSIS — Z741 Need for assistance with personal care: Secondary | ICD-10-CM | POA: Diagnosis not present

## 2022-02-18 DIAGNOSIS — E441 Mild protein-calorie malnutrition: Secondary | ICD-10-CM | POA: Diagnosis not present

## 2022-02-18 DIAGNOSIS — Z79899 Other long term (current) drug therapy: Secondary | ICD-10-CM | POA: Diagnosis not present

## 2022-02-18 DIAGNOSIS — E785 Hyperlipidemia, unspecified: Secondary | ICD-10-CM | POA: Diagnosis not present

## 2022-02-18 DIAGNOSIS — E1122 Type 2 diabetes mellitus with diabetic chronic kidney disease: Secondary | ICD-10-CM | POA: Diagnosis not present

## 2022-02-18 DIAGNOSIS — E1165 Type 2 diabetes mellitus with hyperglycemia: Secondary | ICD-10-CM | POA: Diagnosis not present

## 2022-02-18 DIAGNOSIS — E559 Vitamin D deficiency, unspecified: Secondary | ICD-10-CM | POA: Diagnosis not present

## 2022-02-18 DIAGNOSIS — Y95 Nosocomial condition: Secondary | ICD-10-CM | POA: Diagnosis present

## 2022-02-18 DIAGNOSIS — J479 Bronchiectasis, uncomplicated: Secondary | ICD-10-CM | POA: Diagnosis not present

## 2022-02-18 DIAGNOSIS — I110 Hypertensive heart disease with heart failure: Secondary | ICD-10-CM | POA: Diagnosis not present

## 2022-02-18 DIAGNOSIS — R1312 Dysphagia, oropharyngeal phase: Secondary | ICD-10-CM | POA: Diagnosis not present

## 2022-02-18 DIAGNOSIS — I5032 Chronic diastolic (congestive) heart failure: Secondary | ICD-10-CM | POA: Diagnosis not present

## 2022-02-18 DIAGNOSIS — Z7982 Long term (current) use of aspirin: Secondary | ICD-10-CM | POA: Diagnosis not present

## 2022-02-18 DIAGNOSIS — Z7409 Other reduced mobility: Secondary | ICD-10-CM | POA: Diagnosis not present

## 2022-02-18 DIAGNOSIS — Z20822 Contact with and (suspected) exposure to covid-19: Secondary | ICD-10-CM | POA: Diagnosis not present

## 2022-02-18 DIAGNOSIS — M6281 Muscle weakness (generalized): Secondary | ICD-10-CM | POA: Diagnosis not present

## 2022-02-18 DIAGNOSIS — R059 Cough, unspecified: Secondary | ICD-10-CM | POA: Diagnosis not present

## 2022-02-18 DIAGNOSIS — N1831 Chronic kidney disease, stage 3a: Secondary | ICD-10-CM | POA: Diagnosis not present

## 2022-02-18 DIAGNOSIS — Z66 Do not resuscitate: Secondary | ICD-10-CM | POA: Diagnosis not present

## 2022-02-18 DIAGNOSIS — Z90710 Acquired absence of both cervix and uterus: Secondary | ICD-10-CM | POA: Diagnosis not present

## 2022-02-18 DIAGNOSIS — F419 Anxiety disorder, unspecified: Secondary | ICD-10-CM | POA: Diagnosis not present

## 2022-02-18 DIAGNOSIS — I130 Hypertensive heart and chronic kidney disease with heart failure and stage 1 through stage 4 chronic kidney disease, or unspecified chronic kidney disease: Secondary | ICD-10-CM | POA: Diagnosis not present

## 2022-02-18 DIAGNOSIS — K219 Gastro-esophageal reflux disease without esophagitis: Secondary | ICD-10-CM | POA: Diagnosis not present

## 2022-02-18 LAB — GLUCOSE, CAPILLARY
Glucose-Capillary: 97 mg/dL (ref 70–99)
Glucose-Capillary: 183 mg/dL — ABNORMAL HIGH (ref 70–99)

## 2022-02-18 MED ORDER — TRAMADOL HCL 50 MG PO TABS: 50.0000 mg | ORAL_TABLET | Freq: Two times a day (BID) | ORAL | 0 refills | Status: DC | PRN

## 2022-02-18 MED ORDER — ACETAMINOPHEN 325 MG PO TABS: 650.0000 mg | ORAL_TABLET | ORAL | Status: DC | PRN

## 2022-02-18 MED ORDER — ALPRAZOLAM 0.5 MG PO TABS: 0.5000 mg | ORAL_TABLET | Freq: Two times a day (BID) | ORAL | 0 refills | Status: DC | PRN

## 2022-02-18 NOTE — Discharge Summary (Signed)
Physician Discharge Summary   Katie Alvarado KKX:[id number] DOB: [birth year] DOA: 02/06/2022  PCP: [name], MD  Admit date: 02/06/2022 Discharge date: 02/18/2022 Barriers to discharge: waiting on SNF over the weekend  Admitted From: Home Disposition:  SNF - [facility] Discharging physician: Dwyane Dee, MD  Recommendations for Outpatient Follow-up:  Continue routine care  Home Health:  Equipment/Devices:   Discharge Condition: stable CODE STATUS: DNR Diet recommendation:  Diet Orders (From admission, onward)     Start     Ordered   02/18/22 0000  Diet Carb Modified        02/18/22 1044   02/06/22 2316  Diet heart healthy/carb modified Room service appropriate? Yes; Fluid consistency: Thin  Diet effective now       Question Answer Comment  Diet-HS Snack? Nothing   Room service appropriate? Yes   Fluid consistency: Thin      02/06/22 2315            Hospital Course: Katie Alvarado is a 88 y.o. female with medical history significant for COPD, bronchiectasis, history of Mycobacterium Avium-intracellulare complex (from positive sputum culture on 06/09/2012), type 2 diabetes, hypertension, GERD, HFpEF, history of multifocal mucous plugging at the lung bases (08/30/21), who presented to [facility] ED due to worsening shortness of breath.  Associated with cough and wheezing.  Onset of symptoms about a week PTA. EMS was activated.  Upon EMS arrival she received DuoNebs treatment, IV Solu-Medrol, in route.   In the ED, COVID-19 screening test negative.  No evidence of pneumonia on chest x-ray.  Initially placed on BiPAP, with improvement after treatment with, nebulizer, IV magnesium, and IV steroids via EMS.  She was taken off the BiPAP and placed on 3 L [place] with O2 saturation in the high 90s.   ED Course: Tmax 99.1.  BP 111/53, pulse 59, respiratory rate 15, O2 saturation 97% on 3 L.  Lab studies remarkable for serum potassium 3.4, glucose 214, BUN 32, creatinine 1.10.  GFR 47.   Baseline creatinine 0.8 with GFR greater than 60.  BNP 154.Hemoglobin 10.7 at baseline.  WBC 7.3.  Platelet count 220. Managed with systemic steroids,IV antibiotics antitussives supplemental oxygen.  Assessment and Plan:  Acute COPD exacerbation Mild to moderate bronchiectasis exacerbation Acute hypoxic respiratory failure History of bronchiectasis, most recent sputum culture revealed normal respiratory flora -Completed antibiotic course - Completed prednisone   Osteoarthritis - Continue brace on right thumb as needed   T2DM with uncontrolled hyperglycemia: Blood sugars stable continue her Glucotrol, sliding scale and monitor as below. HBA1c stable 6.8 - continue home regimen and carb diet Anxiety/depression: Mood stable.  Continue xanax PRN Hypokalemia resolved. GERD continue PPI. Hypertension: BP fairly controlled, continue  PTA hydralazine, torsemide and metoprolol.  Last echo from 2021 reviewed EF 60 to 65%, G1 DD Normocytic anemia: 9-10g. CKD stage IIIa creatinine holding stable.    Physical debility/deconditioning: cont PT OT. Plan is for SNF   Goals of care: Discussed by Dr. Lupita Alvarado regarding her CODE STATUS patient confirmed that she is DNR do not want intubation or CPR.    Principal Diagnosis: COPD with acute exacerbation [facility])  Discharge Diagnoses: Active Hospital Problems   Diagnosis Date Noted   COPD with acute exacerbation ([place]) 01/02/2021   Acute respiratory failure with hypoxia ([facility]) 08/30/2021    Priority: High    Resolved Hospital Problems  No resolved problems to display.     Discharge Instructions     Diet Carb Modified   Complete by:  As directed    Increase activity slowly   Complete by: As directed    Increase activity slowly   Complete by: As directed    Increase activity slowly   Complete by: As directed       Allergies as of 02/18/2022       Reactions   Welchol [colesevelam Hcl] Hives   Amlodipine    Ankle swelling   Atorvastatin     Other reaction(s): foot pain   Coreg [carvedilol]    dizzy   Doxycycline    rash   Glucosamine Itching, Other (See Comments)   "Breakouts"   Hydrocodone-acetaminophen    Other reaction(s): did not feel right   Mobic [meloxicam] Other (See Comments)   Unknown reaction   Sulfa Antibiotics    itching   Telmisartan    itching   Tramadol Nausea Only   *can tolerate 1/2 dose" per husband at bedside   Celecoxib Hives, Itching, Rash   Statins Rash        Medication List     TAKE these medications    acetaminophen 325 MG tablet Commonly known as: TYLENOL Take 2 tablets (650 mg total) by mouth every 4 (four) hours as needed for mild pain, moderate pain, fever or headache. What changed:  how much to take when to take this reasons to take this   albuterol 108 (90 Base) MCG/ACT inhaler Commonly known as: VENTOLIN HFA INHALE 2 PUFFS INTO THE LUNGS EVERY 6 HOURS AS NEEDED FOR WHEEZING OR SHORTNESS OF BREATH   ALPRAZolam 0.5 MG tablet Commonly known as: XANAX Take 1 tablet (0.5 mg total) by mouth 2 (two) times daily as needed for anxiety.   aspirin EC 81 MG tablet Take 81 mg by mouth daily. Swallow whole.   budesonide 0.25 MG/2ML nebulizer solution Commonly known as: PULMICORT USE 1 VIAL  IN  NEBULIZER TWICE  DAILY - rinse mouth after treatment What changed: See the new instructions.   carboxymethylcellulose 0.5 % Soln Commonly known as: REFRESH PLUS Place 1 drop into both eyes as needed (dry eyes).   cholecalciferol 1000 units tablet Commonly known as: VITAMIN D Take 1,000 Units by mouth daily.   esomeprazole 40 MG capsule Commonly known as: NEXIUM Take 40 mg by mouth daily.   fluticasone 50 MCG/ACT nasal spray Commonly known as: FLONASE Place 1 spray into both nostrils daily.   glipiZIDE 5 MG tablet Commonly known as: GLUCOTROL Take 5 mg by mouth every morning.   hydrALAZINE 50 MG tablet Commonly known as: APRESOLINE Take 50 mg by mouth 3 (three) times  daily.   ipratropium 0.02 % nebulizer solution Commonly known as: ATROVENT USE 1 VIAL IN NEBULIZER 4 TIMES DAILY What changed: See the new instructions.   irbesartan 300 MG tablet Commonly known as: AVAPRO TAKE 1 TABLET BY MOUTH EVERY DAY   metoprolol tartrate 50 MG tablet Commonly known as: LOPRESSOR Take 1 tablet (50 mg total) by mouth 2 (two) times daily. What changed:  when to take this additional instructions   PRESERVISION AREDS 2 PO Take 1 tablet by mouth in the morning and at bedtime.   torsemide 20 MG tablet Commonly known as: DEMADEX Take 20 mg by mouth daily.   traMADol 50 MG tablet Commonly known as: ULTRAM Take 1 tablet (50 mg total) by mouth 2 (two) times daily as needed for moderate pain or severe pain.   vitamin E 180 MG (400 UNITS) capsule Take 400 Units by mouth 2 (two) times a week.  ASK your doctor about these medications    guaiFENesin-dextromethorphan 100-10 MG/5ML syrup Commonly known as: ROBITUSSIN DM Take 5 mLs by mouth every 4 (four) hours as needed for up to 5 days for cough. Ask about: Should I take this medication?        Contact information for follow-up providers     [name], MD Follow up in 1 week(s).   Specialty: Family Medicine Contact information: [place] [address] [phone]              Contact information for after-discharge care     Destination     [facility] .   Service: Skilled Nursing Contact information: [address] [place] [address] [phone]                    Allergies  Allergen Reactions   Welchol [Colesevelam Hcl] Hives   Amlodipine     Ankle swelling   Atorvastatin     Other reaction(s): foot pain   Coreg [Carvedilol]     dizzy   Doxycycline     rash   Glucosamine Itching and Other (See Comments)    "Breakouts"   Hydrocodone-Acetaminophen     Other reaction(s):  did not feel right   Mobic [Meloxicam] Other (See Comments)    Unknown reaction   Sulfa Antibiotics     itching   Telmisartan     itching   Tramadol Nausea Only    *can tolerate 1/2 dose" per husband at bedside   Celecoxib Hives, Itching and Rash   Statins Rash    Consultations:   Procedures:   Discharge Exam: BP (!) 114/49 (BP Location: Right Arm)   Pulse 87   Temp (!) 97.4 F (36.3 C) (Oral)   Resp 20   Wt 54 kg   SpO2 95%   BMI 18.10 kg/m  Physical Exam Constitutional:      Appearance: Normal appearance.  HENT:     Head: Normocephalic and atraumatic.     Mouth/Throat:     Mouth: Mucous membranes are moist.  Eyes:     Extraocular Movements: Extraocular movements intact.  Cardiovascular:     Rate and Rhythm: Normal rate and regular rhythm.  Pulmonary:     Effort: Pulmonary effort is normal.     Breath sounds: No wheezing.     Comments: Minimal coarse sounds; mostly clear Abdominal:     General: Bowel sounds are normal. There is no distension.     Palpations: Abdomen is soft.     Tenderness: There is no abdominal tenderness.  Musculoskeletal:        General: Normal range of motion.     Cervical back: Normal range of motion and neck supple.  Skin:    General: Skin is warm.  Neurological:     General: No focal deficit present.     Mental Status: She is alert.  Psychiatric:        Mood and Affect: Mood normal.      The results of significant diagnostics from this hospitalization (including imaging, microbiology, ancillary and laboratory) are listed below for reference.   Microbiology: No results found for this or any previous visit (from the past 240 hour(s)).   Labs: BNP (last 3 results) Recent Labs    08/29/21 2150 02/06/22 1935  BNP 126.0* 967.8*   Basic Metabolic Panel: Recent Labs  Lab 02/12/22 0511 02/14/22 0451  NA  135 137  K 3.4* 3.8  CL 92* 95*  CO2 34* 31  GLUCOSE 84 77  BUN 42* 52*  CREATININE 1.13* 1.37*  CALCIUM 8.9 9.0   MG  --  2.3   Liver Function Tests: No results for input(s): "AST", "ALT", "ALKPHOS", "BILITOT", "PROT", "ALBUMIN" in the last 168 hours. No results for input(s): "LIPASE", "AMYLASE" in the last 168 hours. No results for input(s): "AMMONIA" in the last 168 hours. CBC: Recent Labs  Lab 02/12/22 0511 02/14/22 0451  WBC 7.3 8.8  NEUTROABS  --  5.2  HGB 10.3* 11.0*  HCT 31.7* 33.4*  MCV 88.5 87.9  PLT 260 274   Cardiac Enzymes: No results for input(s): "CKTOTAL", "CKMB", "CKMBINDEX", "TROPONINI" in the last 168 hours. BNP: Invalid input(s): "POCBNP" CBG: Recent Labs  Lab 02/17/22 0730 02/17/22 1145 02/17/22 1641 02/17/22 2025 02/18/22 0757  GLUCAP 77 98 293* 253* 97   D-Dimer No results for input(s): "DDIMER" in the last 72 hours. Hgb A1c No results for input(s): "HGBA1C" in the last 72 hours. Lipid Profile No results for input(s): "CHOL", "HDL", "LDLCALC", "TRIG", "CHOLHDL", "LDLDIRECT" in the last 72 hours. Thyroid function studies No results for input(s): "TSH", "T4TOTAL", "T3FREE", "THYROIDAB" in the last 72 hours.  Invalid input(s): "FREET3" Anemia work up No results for input(s): "VITAMINB12", "FOLATE", "FERRITIN", "TIBC", "IRON", "RETICCTPCT" in the last 72 hours. Urinalysis    Component Value Date/Time   COLORURINE STRAW (A) 08/29/2021 0002   APPEARANCEUR CLEAR 08/29/2021 0002   LABSPEC 1.008 08/29/2021 0002   PHURINE 7.0 08/29/2021 0002   GLUCOSEU NEGATIVE 08/29/2021 0002   HGBUR NEGATIVE 08/29/2021 0002   BILIRUBINUR NEGATIVE 08/29/2021 0002   KETONESUR NEGATIVE 08/29/2021 0002   PROTEINUR NEGATIVE 08/29/2021 0002   UROBILINOGEN 0.2 12/19/2018 1155   NITRITE NEGATIVE 08/29/2021 0002   LEUKOCYTESUR NEGATIVE 08/29/2021 0002   Sepsis Labs Recent Labs  Lab 02/12/22 0511 02/14/22 0451  WBC 7.3 8.8   Microbiology No results found for this or any previous visit (from the past 240 hour(s)).  Procedures/Studies: DG Hand Complete Right  Result  Date: 02/13/2022 CLINICAL DATA:  Thumb pain.  Hyperextension. EXAM: RIGHT HAND - COMPLETE 3+ VIEW COMPARISON:  Hand radiographs 05/08/2011. FINDINGS: The pulse oximeter was not removed from the long finger. The bones are diffusely demineralized. There is no evidence of acute fracture or dislocation. There are progressive advanced degenerative changes at the 1st carpometacarpal articulation with associated proximal subluxation of the 1st metacarpal. There is mild hyperextension at the 1st metacarpal phalangeal joint. Progressive joint space narrowing throughout the interphalangeal joints with new ulnar subluxation at the 3rd proximal interphalangeal joint. IMPRESSION: 1. No evidence of acute fracture or dislocation. 2. Progressive degenerative changes as described, most advanced at the 1st [facility] joint. Mild hyperextension at the 1st MCP joint is within physiologic limits. Correlate clinically. Electronically Signed   By: Richardean Sale M.D.   On: 02/13/2022 18:34   DG Chest Port 1 View  Result Date: 02/06/2022 CLINICAL DATA:  Shortness of breath. EXAM: PORTABLE CHEST 1 VIEW COMPARISON:  Radiograph 08/30/2021. CT 01/02/2021 FINDINGS: Stable heart size and mediastinal contours. Aortic atherosclerosis. Chronic left lung volume loss. Chronic right upper lobe and right mid lung opacity corresponds to bronchiectasis and mucoid impaction on prior CT. The chronic left basilar opacities are better appreciated on prior CT with only limited radiographic correlate. No acute airspace disease. No pleural effusion. Chronic left shoulder arthropathy. IMPRESSION: 1. No acute findings. 2. Chronic right upper lobe and right mid  lung opacity corresponds to bronchiectasis and mucoid impaction on prior CT. Electronically Signed   By: Keith Rake M.D.   On: 02/06/2022 20:02     Time coordinating discharge: Over 30 minutes    Dwyane Dee, MD  [facility] 02/18/2022, 10:45 AM

## 2022-02-18 NOTE — Progress Notes (Signed)
Physical Therapy Treatment Patient Details Name: Katie Alvarado MRN: [id number] DOB: 1925/05/25 Today's Date: 02/18/2022   History of Present Illness Katie Alvarado is a 88 y.o. female with medical history significant for COPD, bronchiectasis, history of Mycobacterium Avium-intracellulare complex, type 2 diabetes, hypertension, GERD, HFpEF, history of multifocal mucous plugging at the lung bases who presented to [facility] ED due to worsening shortness of breath.  Associated with cough and wheezing.    PT Comments    Pt ambulated 160' with RW, SaO2 88% on room air walking, ambulation distance limited by fatigue. SaO2 95% on room air at rest. Pt required assist for sit to stand and for bed mobility.  Pt is very pleasant and puts forth good effort. Noted plan is to DC to SNF today.    Recommendations for follow up therapy are one component of a multi-disciplinary discharge planning process, led by the attending physician.  Recommendations may be updated based on patient status, additional functional criteria and insurance authorization.  Follow Up Recommendations  Skilled nursing-short term rehab (<3 hours/day) Can patient physically be transported by private vehicle: Yes   Assistance Recommended at Discharge Frequent or constant Supervision/Assistance  Patient can return home with the following A little help with walking and/or transfers;A little help with bathing/dressing/bathroom;Assistance with cooking/housework;Assist for transportation;Help with stairs or ramp for entrance   Equipment Recommendations  None recommended by PT    Recommendations for Other Services       Precautions / Restrictions Precautions Precautions: Fall Restrictions Weight Bearing Restrictions: No     Mobility  Bed Mobility Overal bed mobility: Needs Assistance Bed Mobility: Supine to Sit     Supine to sit: Min assist     General bed mobility comments: min A to pivot hips to edge of bed     Transfers Overall transfer level: Needs assistance Equipment used: Rolling walker (2 wheels) Transfers: Sit to/from Stand Sit to Stand: Mod assist, Min assist           General transfer comment: sit to stand x 2 trials, first  with bed at lowest level pt was unable to come to full upright position with mod assist, second attempt with bed elevated pt was able to come to full stand with RW and min A.    Ambulation/Gait Ambulation/Gait assistance: Min guard Gait Distance (Feet): 160 Feet Assistive device: Rolling walker (2 wheels) Gait Pattern/deviations: Decreased stride length, Knee flexed in stance - left, Knee flexed in stance - right, Trunk flexed Gait velocity: decr     General Gait Details: distance limited by fatigue, SaO2 88% on room air walking   Stairs             Wheelchair Mobility    Modified Rankin (Stroke Patients Only)       Balance Overall balance assessment: Needs assistance Sitting-balance support: Feet supported Sitting balance-Leahy Scale: Good     Standing balance support: During functional activity, Reliant on assistive device for balance, Bilateral upper extremity supported Standing balance-Leahy Scale: Poor                              Cognition Arousal/Alertness: Awake/alert Behavior During Therapy: WFL for tasks assessed/performed Overall Cognitive Status: Within Functional Limits for tasks assessed  Exercises      General Comments        Pertinent Vitals/Pain Pain Assessment Pain Assessment: No/denies pain Pain Score: 0-No pain    Home Living                          Prior Function            PT Goals (current goals can now be found in the care plan section) Acute Rehab PT Goals Patient Stated Goal: Pt wants to go home. PT Goal Formulation: With patient Time For Goal Achievement: 02/21/22 Potential to Achieve Goals: Good Progress  towards PT goals: Progressing toward goals    Frequency    Min 2X/week      PT Plan Current plan remains appropriate    Co-evaluation              AM-PAC PT "6 Clicks" Mobility   Outcome Measure  Help needed turning from your back to your side while in a flat bed without using bedrails?: A Little Help needed moving from lying on your back to sitting on the side of a flat bed without using bedrails?: A Little Help needed moving to and from a bed to a chair (including a wheelchair)?: A Little Help needed standing up from a chair using your arms (e.g., wheelchair or bedside chair)?: A Lot Help needed to walk in hospital room?: A Little Help needed climbing 3-5 steps with a railing? : A Lot 6 Click Score: 16    End of Session Equipment Utilized During Treatment: Gait belt Activity Tolerance: Patient limited by fatigue Patient left: in bed;with bed alarm set;with call bell/phone within reach Nurse Communication: Mobility status PT Visit Diagnosis: Unsteadiness on feet (R26.81);Other abnormalities of gait and mobility (R26.89);Difficulty in walking, not elsewhere classified (R26.2)     Time: 7353-2992 PT Time Calculation (min) (ACUTE ONLY): 32 min  Charges:  $Gait Training: 8-22 mins $Therapeutic Activity: 8-22 mins                     Blondell Reveal Kistler PT 02/18/2022  Acute Rehabilitation Services  Office [phone]

## 2022-02-18 NOTE — Plan of Care (Signed)
  Problem: Safety: Goal: Ability to remain free from injury will improve Outcome: Progressing   Problem: Pain Managment: Goal: General experience of comfort will improve Outcome: Progressing   Problem: Elimination: Goal: Will not experience complications related to bowel motility Outcome: Progressing

## 2022-02-18 NOTE — Progress Notes (Signed)
02/18/2022  [place]. Report given to [place].

## 2022-02-18 NOTE — TOC Transition Note (Signed)
Transition of Care [facility]) - CM/SW Discharge Note   Patient Details  Name: Katie Alvarado MRN: [id number] Date of Birth: 01-21-1926  [facility] of [facility]) CM/SW Contact:  Vassie Moselle, LCSW Phone Number: 02/18/2022, 12:15 PM   Clinical Narrative:    Pt is to transfer to [facility] and will be going to room 30. RN to call report to [phone]. CSW spoke with pt's friend/caregiver, Fritz Pickerel, who does not feel safe transporting pt to facility in his private vehicle. Pt will be transported to SNF via [facility]. [facility] called at 12:20pm.    Final next level of care: [place] Barriers to Discharge: Barriers Resolved   Patient Goals and CMS Choice Patient states their goals for this hospitalization and ongoing recovery are:: To go to SNF for rehab, then return back home. CMS Medicare.gov Compare Post Acute Care list provided to:: Patient Choice offered to / list presented to : Patient  Discharge Placement   Existing PASRR number confirmed : 02/12/22          Patient [facility] bed at: [facility] Patient to be transferred to facility by: [place] Name of family member notified: Assunta Found Patient and family notified of of transfer: 02/18/22  Discharge Plan and Services     Post Acute Care Choice: [place]          DME Arranged: N/A DME Agency: NA       HH Arranged: PT, OT [place] Agency: [place] Date [place]: 02/15/22 Time [place]: 1630 Representative spoke with at [place]: [place] Determinants of Health ([place]) Interventions     Readmission Risk Interventions    02/18/2022   12:08 PM 02/14/2022   11:23 AM  Readmission Risk Prevention Plan  Transportation Screening Complete Complete  Medication Review Press photographer) Complete Complete  PCP or Specialist appointment within 3-5 days of discharge Complete Complete  HRI or Home Care Consult Complete Complete  SW Recovery Care/Counseling  Consult Complete Complete  Palliative Care Screening Not Applicable Not Applicable  Skilled Nursing Facility Complete Complete

## 2022-02-20 ENCOUNTER — Inpatient Hospital Stay (HOSPITAL_COMMUNITY)
Admission: EM | Admit: 2022-02-20 | Discharge: 2022-02-23 | DRG: 194 | Disposition: A | Discharge status: Home Health | Payer: Medicare Other | Attending: Internal Medicine | Admitting: Internal Medicine

## 2022-02-20 ENCOUNTER — Encounter (HOSPITAL_COMMUNITY): Payer: Self-pay

## 2022-02-20 ENCOUNTER — Emergency Department (HOSPITAL_COMMUNITY): Payer: Medicare Other

## 2022-02-20 ENCOUNTER — Other Ambulatory Visit: Payer: Self-pay

## 2022-02-20 DIAGNOSIS — J441 Chronic obstructive pulmonary disease with (acute) exacerbation: Secondary | ICD-10-CM | POA: Diagnosis present

## 2022-02-20 DIAGNOSIS — R0602 Shortness of breath: Secondary | ICD-10-CM | POA: Diagnosis not present

## 2022-02-20 DIAGNOSIS — J471 Bronchiectasis with (acute) exacerbation: Secondary | ICD-10-CM | POA: Diagnosis present

## 2022-02-20 DIAGNOSIS — E1122 Type 2 diabetes mellitus with diabetic chronic kidney disease: Secondary | ICD-10-CM | POA: Diagnosis present

## 2022-02-20 DIAGNOSIS — J189 Pneumonia, unspecified organism: Principal | ICD-10-CM | POA: Diagnosis present

## 2022-02-20 DIAGNOSIS — Z20822 Contact with and (suspected) exposure to covid-19: Secondary | ICD-10-CM | POA: Diagnosis present

## 2022-02-20 DIAGNOSIS — Z85828 Personal history of other malignant neoplasm of skin: Secondary | ICD-10-CM | POA: Diagnosis not present

## 2022-02-20 DIAGNOSIS — I5032 Chronic diastolic (congestive) heart failure: Secondary | ICD-10-CM | POA: Diagnosis not present

## 2022-02-20 DIAGNOSIS — Z90710 Acquired absence of both cervix and uterus: Secondary | ICD-10-CM | POA: Diagnosis not present

## 2022-02-20 DIAGNOSIS — J440 Chronic obstructive pulmonary disease with acute lower respiratory infection: Secondary | ICD-10-CM | POA: Diagnosis present

## 2022-02-20 DIAGNOSIS — I130 Hypertensive heart and chronic kidney disease with heart failure and stage 1 through stage 4 chronic kidney disease, or unspecified chronic kidney disease: Secondary | ICD-10-CM | POA: Diagnosis not present

## 2022-02-20 DIAGNOSIS — N1831 Chronic kidney disease, stage 3a: Secondary | ICD-10-CM | POA: Diagnosis present

## 2022-02-20 DIAGNOSIS — Z7951 Long term (current) use of inhaled steroids: Secondary | ICD-10-CM

## 2022-02-20 DIAGNOSIS — E441 Mild protein-calorie malnutrition: Secondary | ICD-10-CM | POA: Diagnosis present

## 2022-02-20 DIAGNOSIS — R531 Weakness: Secondary | ICD-10-CM | POA: Diagnosis not present

## 2022-02-20 DIAGNOSIS — Z881 Allergy status to other antibiotic agents status: Secondary | ICD-10-CM

## 2022-02-20 DIAGNOSIS — J470 Bronchiectasis with acute lower respiratory infection: Secondary | ICD-10-CM | POA: Diagnosis present

## 2022-02-20 DIAGNOSIS — Z885 Allergy status to narcotic agent status: Secondary | ICD-10-CM

## 2022-02-20 DIAGNOSIS — F419 Anxiety disorder, unspecified: Secondary | ICD-10-CM | POA: Diagnosis present

## 2022-02-20 DIAGNOSIS — E785 Hyperlipidemia, unspecified: Secondary | ICD-10-CM | POA: Diagnosis present

## 2022-02-20 DIAGNOSIS — Z7984 Long term (current) use of oral hypoglycemic drugs: Secondary | ICD-10-CM | POA: Diagnosis not present

## 2022-02-20 DIAGNOSIS — Z79899 Other long term (current) drug therapy: Secondary | ICD-10-CM | POA: Diagnosis not present

## 2022-02-20 DIAGNOSIS — Z681 Body mass index (BMI) 19 or less, adult: Secondary | ICD-10-CM

## 2022-02-20 DIAGNOSIS — I959 Hypotension, unspecified: Secondary | ICD-10-CM | POA: Diagnosis not present

## 2022-02-20 DIAGNOSIS — Z882 Allergy status to sulfonamides status: Secondary | ICD-10-CM

## 2022-02-20 DIAGNOSIS — K219 Gastro-esophageal reflux disease without esophagitis: Secondary | ICD-10-CM | POA: Diagnosis present

## 2022-02-20 DIAGNOSIS — Z888 Allergy status to other drugs, medicaments and biological substances status: Secondary | ICD-10-CM

## 2022-02-20 DIAGNOSIS — Z66 Do not resuscitate: Secondary | ICD-10-CM | POA: Diagnosis present

## 2022-02-20 DIAGNOSIS — D649 Anemia, unspecified: Secondary | ICD-10-CM | POA: Diagnosis present

## 2022-02-20 DIAGNOSIS — Z7982 Long term (current) use of aspirin: Secondary | ICD-10-CM

## 2022-02-20 DIAGNOSIS — Z9981 Dependence on supplemental oxygen: Secondary | ICD-10-CM

## 2022-02-20 DIAGNOSIS — J479 Bronchiectasis, uncomplicated: Secondary | ICD-10-CM | POA: Diagnosis not present

## 2022-02-20 DIAGNOSIS — E119 Type 2 diabetes mellitus without complications: Secondary | ICD-10-CM

## 2022-02-20 DIAGNOSIS — Z923 Personal history of irradiation: Secondary | ICD-10-CM

## 2022-02-20 DIAGNOSIS — Y95 Nosocomial condition: Secondary | ICD-10-CM | POA: Diagnosis present

## 2022-02-20 LAB — COMPREHENSIVE METABOLIC PANEL
ALT: 11 U/L (ref 0–44)
AST: 15 U/L (ref 15–41)
Albumin: 3.2 g/dL — ABNORMAL LOW (ref 3.5–5.0)
Alkaline Phosphatase: 51 U/L (ref 38–126)
Anion gap: 11 (ref 5–15)
BUN: 37 mg/dL — ABNORMAL HIGH (ref 8–23)
CO2: 28 mmol/L (ref 22–32)
Calcium: 9 mg/dL (ref 8.9–10.3)
Chloride: 96 mmol/L — ABNORMAL LOW (ref 98–111)
Creatinine, Ser: 1.36 mg/dL — ABNORMAL HIGH (ref 0.44–1.00)
GFR, Estimated: 36 mL/min — ABNORMAL LOW (ref >60)
Glucose, Bld: 120 mg/dL — ABNORMAL HIGH (ref 70–99)
Potassium: 3.9 mmol/L (ref 3.5–5.1)
Sodium: 135 mmol/L (ref 135–145)
Total Bilirubin: 0.9 mg/dL (ref 0.3–1.2)
Total Protein: 6.2 g/dL — ABNORMAL LOW (ref 6.5–8.1)

## 2022-02-20 LAB — CBC WITH DIFFERENTIAL/PLATELET
Abs Immature Granulocytes: 0.05 10*3/uL (ref 0.00–0.07)
Basophils Absolute: 0.1 10*3/uL (ref 0.0–0.1)
Basophils Relative: 1 %
Eosinophils Absolute: 0.1 10*3/uL (ref 0.0–0.5)
Eosinophils Relative: 1 %
HCT: 34.4 % — ABNORMAL LOW (ref 36.0–46.0)
Hemoglobin: 11 g/dL — ABNORMAL LOW (ref 12.0–15.0)
Immature Granulocytes: 1 %
Lymphocytes Relative: 19 %
Lymphs Abs: 1.7 10*3/uL (ref 0.7–4.0)
MCH: 28.8 pg (ref 26.0–34.0)
MCHC: 32 g/dL (ref 30.0–36.0)
MCV: 90.1 fL (ref 80.0–100.0)
Monocytes Absolute: 1.3 10*3/uL — ABNORMAL HIGH (ref 0.1–1.0)
Monocytes Relative: 15 %
Neutro Abs: 5.6 10*3/uL (ref 1.7–7.7)
Neutrophils Relative %: 63 %
Platelets: 251 10*3/uL (ref 150–400)
RBC: 3.82 MIL/uL — ABNORMAL LOW (ref 3.87–5.11)
RDW: 14.1 % (ref 11.5–15.5)
WBC: 8.8 10*3/uL (ref 4.0–10.5)
nRBC: 0 % (ref 0.0–0.2)

## 2022-02-20 LAB — RESP PANEL BY RT-PCR (FLU A&B, COVID) ARPGX2
Influenza A by PCR: NEGATIVE
Influenza B by PCR: NEGATIVE
SARS Coronavirus 2 by RT PCR: NEGATIVE

## 2022-02-20 LAB — URINALYSIS, ROUTINE W REFLEX MICROSCOPIC
Bacteria, UA: NONE SEEN
Bilirubin Urine: NEGATIVE
Glucose, UA: NEGATIVE mg/dL
Hgb urine dipstick: NEGATIVE
Ketones, ur: NEGATIVE mg/dL
Nitrite: NEGATIVE
Protein, ur: NEGATIVE mg/dL
Specific Gravity, Urine: 1.009 (ref 1.005–1.030)
pH: 6 (ref 5.0–8.0)

## 2022-02-20 LAB — CK: Total CK: 24 U/L — ABNORMAL LOW (ref 38–234)

## 2022-02-20 LAB — GLUCOSE, CAPILLARY: Glucose-Capillary: 159 mg/dL — ABNORMAL HIGH (ref 70–99)

## 2022-02-20 LAB — STREP PNEUMONIAE URINARY ANTIGEN: Strep Pneumo Urinary Antigen: NEGATIVE

## 2022-02-20 LAB — PROTIME-INR
INR: 1.2 (ref 0.8–1.2)
Prothrombin Time: 15.1 seconds (ref 11.4–15.2)

## 2022-02-20 LAB — MAGNESIUM: Magnesium: 2 mg/dL (ref 1.7–2.4)

## 2022-02-20 LAB — CBG MONITORING, ED
Glucose-Capillary: 162 mg/dL — ABNORMAL HIGH (ref 70–99)
Glucose-Capillary: 130 mg/dL — ABNORMAL HIGH (ref 70–99)
Glucose-Capillary: 201 mg/dL — ABNORMAL HIGH (ref 70–99)

## 2022-02-20 LAB — LACTIC ACID, PLASMA
Lactic Acid, Venous: 1.4 mmol/L (ref 0.5–1.9)
Lactic Acid, Venous: 1 mmol/L (ref 0.5–1.9)

## 2022-02-20 LAB — PHOSPHORUS: Phosphorus: 3.2 mg/dL (ref 2.5–4.6)

## 2022-02-20 LAB — APTT: aPTT: 27 seconds (ref 24–36)

## 2022-02-20 MED ORDER — GLIPIZIDE 5 MG PO TABS
5.0000 mg | ORAL_TABLET | Freq: Every day | ORAL | Status: DC
  Administered 2022-02-21 – 2022-02-23 (×3): 5 mg via ORAL
  Filled 2022-02-20 (×4): qty 1

## 2022-02-20 MED ORDER — CARBOXYMETHYLCELLULOSE SODIUM 0.5 % OP SOLN: 1.0000 [drp] | OPHTHALMIC | Status: DC | PRN

## 2022-02-20 MED ORDER — SODIUM CHLORIDE 0.9 % IV SOLN: 2.0000 g | INTRAVENOUS | Status: DC

## 2022-02-20 MED ORDER — SODIUM CHLORIDE 0.9 % IV SOLN
2.0000 g | Freq: Once | INTRAVENOUS | Status: AC
  Administered 2022-02-20: 2 g via INTRAVENOUS
  Filled 2022-02-20: qty 12.5

## 2022-02-20 MED ORDER — LACTATED RINGERS IV SOLN: INTRAVENOUS | Status: AC

## 2022-02-20 MED ORDER — ACETAMINOPHEN 325 MG PO TABS
650.0000 mg | ORAL_TABLET | Freq: Four times a day (QID) | ORAL | Status: DC | PRN
  Administered 2022-02-20 – 2022-02-23 (×10): 650 mg via ORAL
  Filled 2022-02-20 (×10): qty 2

## 2022-02-20 MED ORDER — VITAMIN D 25 MCG (1000 UNIT) PO TABS
1000.0000 [IU] | ORAL_TABLET | Freq: Every day | ORAL | Status: DC
  Administered 2022-02-20 – 2022-02-23 (×4): 1000 [IU] via ORAL
  Filled 2022-02-20 (×4): qty 1

## 2022-02-20 MED ORDER — IPRATROPIUM-ALBUTEROL 0.5-2.5 (3) MG/3ML IN SOLN
3.0000 mL | Freq: Three times a day (TID) | RESPIRATORY_TRACT | Status: DC
  Administered 2022-02-20 – 2022-02-22 (×6): 3 mL via RESPIRATORY_TRACT
  Filled 2022-02-20 (×6): qty 3

## 2022-02-20 MED ORDER — GUAIFENESIN ER 600 MG PO TB12
600.0000 mg | ORAL_TABLET | Freq: Two times a day (BID) | ORAL | Status: DC
  Administered 2022-02-20 – 2022-02-23 (×7): 600 mg via ORAL
  Filled 2022-02-20 (×7): qty 1

## 2022-02-20 MED ORDER — ONDANSETRON HCL 4 MG/2ML IJ SOLN: 4.0000 mg | Freq: Four times a day (QID) | INTRAMUSCULAR | Status: DC | PRN

## 2022-02-20 MED ORDER — PANTOPRAZOLE SODIUM 40 MG PO TBEC
40.0000 mg | DELAYED_RELEASE_TABLET | Freq: Every day | ORAL | Status: DC
  Administered 2022-02-20 – 2022-02-23 (×4): 40 mg via ORAL
  Filled 2022-02-20 (×4): qty 1

## 2022-02-20 MED ORDER — ASPIRIN 81 MG PO TBEC
81.0000 mg | DELAYED_RELEASE_TABLET | Freq: Every day | ORAL | Status: DC
  Administered 2022-02-20 – 2022-02-23 (×4): 81 mg via ORAL
  Filled 2022-02-20 (×4): qty 1

## 2022-02-20 MED ORDER — ONDANSETRON HCL 4 MG PO TABS: 4.0000 mg | ORAL_TABLET | Freq: Four times a day (QID) | ORAL | Status: DC | PRN

## 2022-02-20 MED ORDER — ACETAMINOPHEN 650 MG RE SUPP: 650.0000 mg | Freq: Four times a day (QID) | RECTAL | Status: DC | PRN

## 2022-02-20 MED ORDER — BUDESONIDE 0.25 MG/2ML IN SUSP
0.2500 mg | Freq: Every day | RESPIRATORY_TRACT | Status: DC
  Administered 2022-02-20 – 2022-02-23 (×4): 0.25 mg via RESPIRATORY_TRACT
  Filled 2022-02-20 (×4): qty 2

## 2022-02-20 MED ORDER — SODIUM CHLORIDE 0.9 % IV SOLN
2.0000 g | INTRAVENOUS | Status: DC
  Administered 2022-02-21: 2 g via INTRAVENOUS
  Filled 2022-02-20: qty 12.5

## 2022-02-20 MED ORDER — ALBUTEROL SULFATE (2.5 MG/3ML) 0.083% IN NEBU: 2.5000 mg | INHALATION_SOLUTION | Freq: Four times a day (QID) | RESPIRATORY_TRACT | Status: DC | PRN

## 2022-02-20 MED ORDER — POLYVINYL ALCOHOL 1.4 % OP SOLN: 1.0000 [drp] | OPHTHALMIC | Status: DC | PRN

## 2022-02-20 MED ORDER — SODIUM CHLORIDE 0.9 % IV BOLUS
500.0000 mL | Freq: Once | INTRAVENOUS | Status: AC
  Administered 2022-02-20: 500 mL via INTRAVENOUS

## 2022-02-20 MED ORDER — TRAMADOL HCL 50 MG PO TABS: 25.0000 mg | ORAL_TABLET | Freq: Four times a day (QID) | ORAL | Status: DC | PRN

## 2022-02-20 MED ORDER — VANCOMYCIN HCL IN DEXTROSE 1-5 GM/200ML-% IV SOLN: 1000.0000 mg | INTRAVENOUS | Status: DC

## 2022-02-20 MED ORDER — VANCOMYCIN HCL IN DEXTROSE 1-5 GM/200ML-% IV SOLN
1000.0000 mg | Freq: Once | INTRAVENOUS | Status: AC
  Administered 2022-02-20: 1000 mg via INTRAVENOUS
  Filled 2022-02-20: qty 200

## 2022-02-20 MED ORDER — ALPRAZOLAM 0.5 MG PO TABS
0.5000 mg | ORAL_TABLET | Freq: Two times a day (BID) | ORAL | Status: DC | PRN
  Administered 2022-02-20 – 2022-02-22 (×3): 0.5 mg via ORAL
  Filled 2022-02-20 (×3): qty 1

## 2022-02-20 MED ORDER — ACETAMINOPHEN 325 MG PO TABS
650.0000 mg | ORAL_TABLET | Freq: Once | ORAL | Status: AC
  Administered 2022-02-20: 650 mg via ORAL
  Filled 2022-02-20: qty 2

## 2022-02-20 NOTE — ED Notes (Signed)
Pt in bed with eyes closed, resps even and unlabored.  

## 2022-02-20 NOTE — ED Notes (Signed)
Pt in bed, pt reports decreased pain in her feet, reports increased pain in her back and knees. Sig other at bedside, pt watching tv.

## 2022-02-20 NOTE — ED Notes (Signed)
Pt in bed, family at bedside, pt has no requests at this time, pt resting

## 2022-02-20 NOTE — ED Provider Notes (Signed)
[facility] DEPT Provider Note   CSN: [id number] Arrival date & time: 02/20/22  0404     History  Chief Complaint  Patient presents with   Shortness of Breath    Katie Alvarado is a 88 y.o. female.  The history is provided by the patient, the EMS personnel and medical records.  Shortness of Breath Katie Alvarado is a 88 y.o. female who presents to the Emergency Department complaining of shortness of breath.  She presents to the emergency department by EMS from rehab facility for evaluation of shortness of breath.  EMS reports that she is on 1-1/2 L nasal cannula at baseline and she woke up short of breath at 3 AM with sats of 90%.  Upon increasing her supplemental oxygen to 3 L her sats improved to the mid 90s.  Patient complains of severe pain all over that has been ongoing for which she describes as a long period of time.  She does report shortness of breath, cough and feeling unwell.  She was recently hospitalized for 17 days for bronchiectasis flare.     Home Medications Prior to Admission medications   Medication Sig Start Date End Date Taking? Authorizing Provider  acetaminophen (TYLENOL) 325 MG tablet Take 2 tablets (650 mg total) by mouth every 4 (four) hours as needed for mild pain, moderate pain, fever or headache. 02/18/22   Dwyane Dee, MD  albuterol (VENTOLIN HFA) 108 (90 Base) MCG/ACT inhaler INHALE 2 PUFFS INTO THE LUNGS EVERY 6 HOURS AS NEEDED FOR WHEEZING OR SHORTNESS OF BREATH Patient taking differently: Inhale 2 puffs into the lungs every 6 (six) hours as needed for wheezing or shortness of breath. 06/18/21   Deneise Lever, MD  ALPRAZolam Duanne Moron) 0.5 MG tablet Take 1 tablet (0.5 mg total) by mouth 2 (two) times daily as needed for anxiety. 02/18/22   Dwyane Dee, MD  aspirin EC 81 MG tablet Take 81 mg by mouth daily. Swallow whole.    [provider]  budesonide (PULMICORT) 0.25 MG/2ML nebulizer solution USE 1 VIAL  IN   NEBULIZER TWICE  DAILY - rinse mouth after treatment Patient taking differently: Take 0.25 mg by nebulization daily. 06/30/20   Baird Lyons D, MD  carboxymethylcellulose (REFRESH PLUS) 0.5 % SOLN Place 1 drop into both eyes as needed (dry eyes).    [provider]  cholecalciferol (VITAMIN D) 1000 UNITS tablet Take 1,000 Units by mouth daily.    [provider]  esomeprazole (NEXIUM) 40 MG capsule Take 40 mg by mouth daily.    [provider]  fluticasone (FLONASE) 50 MCG/ACT nasal spray Place 1 spray into both nostrils daily. 02/05/22   [provider]  glipiZIDE (GLUCOTROL) 5 MG tablet Take 5 mg by mouth every morning. 02/14/21   [provider]  hydrALAZINE (APRESOLINE) 50 MG tablet Take 50 mg by mouth 3 (three) times daily. 03/12/21   [provider]  ipratropium (ATROVENT) 0.02 % nebulizer solution USE 1 VIAL IN NEBULIZER 4 TIMES DAILY Patient taking differently: Take 0.25 mg by nebulization 4 (four) times daily. 07/05/21   Baird Lyons D, MD  irbesartan (AVAPRO) 300 MG tablet TAKE 1 TABLET BY MOUTH EVERY DAY Patient taking differently: Take 300 mg by mouth daily. 10/04/21   Jerline Pain, MD  metoprolol tartrate (LOPRESSOR) 50 MG tablet Take 1 tablet (50 mg total) by mouth 2 (two) times daily. Patient taking differently: Take 50 mg by mouth See admin instructions. Take 1 tablet (  50 mg) by mouth in the morning and at 2pm then take 1/2 tablet (25 mg) at bedtime 09/01/21   Mercy Riding, MD  Multiple Vitamins-Minerals (PRESERVISION AREDS 2 PO) Take 1 tablet by mouth in the morning and at bedtime.    [provider]  torsemide (DEMADEX) 20 MG tablet Take 20 mg by mouth daily. 12/29/21   [provider]  traMADol (ULTRAM) 50 MG tablet Take 1 tablet (50 mg total) by mouth 2 (two) times daily as needed for moderate pain or severe pain. 02/18/22   Dwyane Dee, MD  vitamin E 400 UNIT capsule Take 400 Units by mouth 2 (two) times  a week.    [provider]      Allergies    Roanna Banning hcl], Amlodipine, Atorvastatin, Coreg [carvedilol], Doxycycline, Glucosamine, Hydrocodone-acetaminophen, Mobic [meloxicam], Sulfa antibiotics, Telmisartan, Tramadol, Celecoxib, and Statins    Review of Systems   Review of Systems  Respiratory:  Positive for shortness of breath.   All other systems reviewed and are negative.   Physical Exam Updated Vital Signs BP 124/61   Pulse 82   Temp (!) 101.6 F (38.7 C) (Rectal)   Resp 19   Ht '5\' 9"'$  (1.753 m)   Wt 56.7 kg   SpO2 100%   BMI 18.46 kg/m  Physical Exam Vitals and nursing note reviewed.  Constitutional:      Appearance: She is well-developed.  HENT:     Head: Normocephalic and atraumatic.  Cardiovascular:     Rate and Rhythm: Normal rate and regular rhythm.     Heart sounds: No murmur heard. Pulmonary:     Effort: Pulmonary effort is normal. No respiratory distress.     Comments: Fine crackles at bilateral bases, left greater than right Abdominal:     Palpations: Abdomen is soft.     Tenderness: There is no abdominal tenderness. There is no guarding or rebound.  Musculoskeletal:        General: No swelling or tenderness.  Skin:    General: Skin is warm and dry.  Neurological:     Mental Status: She is alert and oriented to person, place, and time.     Comments: Generalized weakness  Psychiatric:        Behavior: Behavior normal.     ED Results / Procedures / Treatments   Labs (all labs ordered are listed, but only abnormal results are displayed) Labs Reviewed  COMPREHENSIVE METABOLIC PANEL - Abnormal; Notable for the following components:      Result Value   Chloride 96 (*)    Glucose, Bld 120 (*)    BUN 37 (*)    Creatinine, Ser 1.36 (*)    Total Protein 6.2 (*)    Albumin 3.2 (*)    GFR, Estimated 36 (*)    All other components within normal limits  CBC WITH DIFFERENTIAL/PLATELET - Abnormal; Notable for the following  components:   RBC 3.82 (*)    Hemoglobin 11.0 (*)    HCT 34.4 (*)    Monocytes Absolute 1.3 (*)    All other components within normal limits  CK - Abnormal; Notable for the following components:   Total CK 24 (*)    All other components within normal limits  RESP PANEL BY RT-PCR (FLU A&B, COVID) ARPGX2  CULTURE, BLOOD (ROUTINE X 2)  CULTURE, BLOOD (ROUTINE X 2)  URINE CULTURE  LACTIC ACID, PLASMA  PROTIME-INR  APTT  LACTIC ACID, PLASMA  URINALYSIS, ROUTINE W REFLEX MICROSCOPIC  EKG EKG Interpretation  Date/Time:  Wednesday February 20 2022 04:30:25 EDT Ventricular Rate:  79 PR Interval:  176 QRS Duration: 94 QT Interval:  372 QTC Calculation: 427 R Axis:   -1 Text Interpretation: Sinus rhythm Consider left atrial enlargement Anteroseptal infarct, age indeterminate Confirmed by Quintella Reichert [phone]) on 02/20/2022 4:35:52 AM  Radiology DG Chest Port 1 View  Result Date: 02/20/2022 CLINICAL DATA:  Progressive shortness of breath. EXAM: PORTABLE CHEST 1 VIEW COMPARISON:  02/06/2022 FINDINGS: Stable cardiomediastinal contours. Chronic reticular and nodular interstitial opacities are identified scattered throughout both lungs. This corresponds with previously demonstrated bronchiectasis, mucoid impaction, scarring and architectural distortion. There is new superimposed airspace disease identified within the left lower lobe concerning for pneumonia. Visualized osseous structures appear intact. IMPRESSION: 1. New superimposed airspace disease within the left lower lobe concerning for pneumonia. 2. Chronic reticular and nodular interstitial opacities scattered throughout both lungs compatible with chronic lung disease. Electronically Signed   By: Kerby Moors M.D.   On: 02/20/2022 05:34    Procedures Procedures    Medications Ordered in ED Medications  vancomycin (VANCOCIN) IVPB 1000 mg/200 mL premix (1,000 mg Intravenous New Bag/Given 02/20/22 0658)  acetaminophen (TYLENOL)  tablet 650 mg (650 mg Oral Given 02/20/22 0544)  ceFEPIme (MAXIPIME) 2 g in sodium chloride 0.9 % 100 mL IVPB (0 g Intravenous Stopped 02/20/22 0370)    ED Course/ Medical Decision Making/ A&P                           Medical Decision Making Amount and/or Complexity of Data Reviewed Labs: ordered. Radiology: ordered. ECG/medicine tests: ordered.  Risk OTC drugs. Prescription drug management. Decision regarding hospitalization.   Patient with history of bronchiectasis, diabetes, hypertension on 1-1/2 L oxygen here for evaluation of increased shortness of breath as well as increased pain all over.  She is febrile on ED presentation with crackles in the bases bilaterally, left greater than right.  She has a slight increase in her baseline oxygen requirement to 2 L from 1-1/2.  Chest x-ray is concerning for new left lower lobe infiltrate - images personally reviewed and interpreted.  Given her symptoms, fever, recent hospitalization concern for healthcare associated pneumonia.  She was started on broad-spectrum antibiotics with recommendation for admission for ongoing care.  Medicine consulted for admission.  Patient updated findings of studies recommendation for admission and she is in agreement treatment plan.        Final Clinical Impression(s) / ED Diagnoses Final diagnoses:  Community acquired pneumonia of left lower lobe of lung    Rx / DC Orders ED Discharge Orders     None         Quintella Reichert, MD 02/20/22 0710

## 2022-02-20 NOTE — H&P (Signed)
History and Physical    Patient: Katie Alvarado NTZ:[id number] DOB: 1925/09/17 DOA: 02/20/2022 DOS: the patient was seen and examined on 02/20/2022 PCP: [name], MD  Patient coming from: Home  Chief Complaint:  Chief Complaint  Patient presents with   Shortness of Breath   HPI: Katie Alvarado is a 88 y.o. female with medical history significant of common cold, surgical-itis, bronchiectasis, COPD on home oxygen at 1.5 LPM, skin cancer, type 2 diabetes, GERD, hypertension, history of pneumonia who was recently admitted and discharged from 02/06/2022 on 3 02/18/2022 due to acute respiratory failure secondary to COPD/bronchiectasis exacerbation and is returning today to the emergency department due to fever, chills, decreased appetite, generalized arthralgias/myalgias, wheezing and worsening dyspnea with mild increasing oxygen requirement since yesterday evening.  No night sweats. No sore throat, rhinorrhea or hemoptysis.  No chest pain, palpitations, diaphoresis, PND, orthopnea or pitting edema of the lower extremities.  No appetite changes, abdominal pain, diarrhea, constipation, melena or hematochezia.  No flank pain, dysuria, frequency or hematuria.  No polyuria, polydipsia, polyphagia or blurred vision.  ED course: Initial vital signs were temperature 100.3 F, pulse 77, respirations 20, BP 145/98 mmHg O2 sat 94% on nasal cannula oxygen at 2 LPM.  Patient received 500 mL normal saline bolus, acetaminophen 650 mg p.o. x1, cefepime 2 g IVPB and vancomycin 1000 mg IVPB.  Lab work: CBC is her white count of 8.8, hemoglobin 11.0 g/dL platelets 251.  PT 15.1, INR 1.2, PTT 27.  Coronavirus and influenza PCR was negative.  Lactic acid x2 normal.  CMP showed a chloride 96 mmol/L, the rest of the electrolytes were normal.  Glucose 120, BUN 37 creatinine 1.36 mg deciliter.  Total protein 6.2 and albumin 3.2 g deciliter.  The rest of the LFTs were normal.  Imaging: 1 view chest radiograph with a  new superimposed airspace disease within the left lower lobe concerning for pneumonia.  There are chronic reticular and nodular interstitial opacities scattered throughout both lungs compatible with chronic lung disease.   Review of Systems: As mentioned in the history of present illness. All other systems reviewed and are negative. Past Medical History:  Diagnosis Date   Acute nasopharyngitis (common cold)    Arthritis    Bronchiectasis with acute exacerbation (HCC)    Cancer (HCC)    skin cancer   COPD (chronic obstructive pulmonary disease) ([place])    sees dr. Annamaria Boots    Cough    Diabetes mellitus    fasting 130-150   Dizziness    Esophageal reflux    History of radiation therapy 10/24/17- 12/05/17   Lower lip, 4.4 Gy X 10 fractions given twice weekly for a total dose of 44 Gy.   Hypertension    Insomnia, unspecified    Other symptoms involving nervous and musculoskeletal systems(781.99)    Pneumonia    hx of   Shortness of breath    exertion   Past Surgical History:  Procedure Laterality Date   ABDOMINAL HYSTERECTOMY     ANKLE FRACTURE SURGERY Left    APPENDECTOMY     BASAL CELL CARCINOMA EXCISION     NOSE   CATARACT EXTRACTION, BILATERAL     OUT PATIENT   COLON SURGERY     colon resection for diverticulitis   IR EPIDUROGRAPHY  05/07/2018   LUMBAR LAMINECTOMY/DECOMPRESSION MICRODISCECTOMY N/A 02/15/2013   Procedure: LUMBAR LAMINECTOMY/DECOMPRESSION MICRODISCECTOMY LUMBAR FOUR-FIVE;  Surgeon: Otilio Connors, MD;  Location: [facility];  Service: Neurosurgery;  Laterality:  N/A;   VARICOSE VEIN SURGERY     Social History:  reports that she has never smoked. She has never used smokeless tobacco. She reports current alcohol use. She reports that she does not use drugs.  Allergies  Allergen Reactions   Welchol [Colesevelam Hcl] Hives   Amlodipine     Ankle swelling   Atorvastatin     Other reaction(s): foot pain   Coreg [Carvedilol]     dizzy   Doxycycline     rash    Glucosamine Itching and Other (See Comments)    "Breakouts"   Hydrocodone-Acetaminophen     Other reaction(s): did not feel right   Mobic [Meloxicam] Other (See Comments)    Unknown reaction   Sulfa Antibiotics     itching   Telmisartan     itching   Tramadol Nausea Only    *can tolerate 1/2 dose" per husband at bedside   Celecoxib Hives, Itching and Rash   Statins Rash    Family History  Problem Relation Age of Onset   Chronic bronchitis Father    Other Sister        heart trouble   Other Brother        heart trouble    Prior to Admission medications   Medication Sig Start Date End Date Taking? Authorizing Provider  acetaminophen (TYLENOL) 325 MG tablet Take 2 tablets (650 mg total) by mouth every 4 (four) hours as needed for mild pain, moderate pain, fever or headache. 02/18/22   Dwyane Dee, MD  albuterol (VENTOLIN HFA) 108 (90 Base) MCG/ACT inhaler INHALE 2 PUFFS INTO THE LUNGS EVERY 6 HOURS AS NEEDED FOR WHEEZING OR SHORTNESS OF BREATH Patient taking differently: Inhale 2 puffs into the lungs every 6 (six) hours as needed for wheezing or shortness of breath. 06/18/21   Deneise Lever, MD  ALPRAZolam Duanne Moron) 0.5 MG tablet Take 1 tablet (0.5 mg total) by mouth 2 (two) times daily as needed for anxiety. 02/18/22   Dwyane Dee, MD  aspirin EC 81 MG tablet Take 81 mg by mouth daily. Swallow whole.    [provider]  budesonide (PULMICORT) 0.25 MG/2ML nebulizer solution USE 1 VIAL  IN  NEBULIZER TWICE  DAILY - rinse mouth after treatment Patient taking differently: Take 0.25 mg by nebulization daily. 06/30/20   Baird Lyons D, MD  carboxymethylcellulose (REFRESH PLUS) 0.5 % SOLN Place 1 drop into both eyes as needed (dry eyes).    [provider]  cholecalciferol (VITAMIN D) 1000 UNITS tablet Take 1,000 Units by mouth daily.    [provider]  esomeprazole (NEXIUM) 40 MG capsule Take 40 mg by mouth daily.    [provider]   fluticasone (FLONASE) 50 MCG/ACT nasal spray Place 1 spray into both nostrils daily. 02/05/22   [provider]  glipiZIDE (GLUCOTROL) 5 MG tablet Take 5 mg by mouth every morning. 02/14/21   [provider]  hydrALAZINE (APRESOLINE) 50 MG tablet Take 50 mg by mouth 3 (three) times daily. 03/12/21   [provider]  ipratropium (ATROVENT) 0.02 % nebulizer solution USE 1 VIAL IN NEBULIZER 4 TIMES DAILY Patient taking differently: Take 0.25 mg by nebulization 4 (four) times daily. 07/05/21   Baird Lyons D, MD  irbesartan (AVAPRO) 300 MG tablet TAKE 1 TABLET BY MOUTH EVERY DAY Patient taking differently: Take 300 mg by mouth daily. 10/04/21   Jerline Pain, MD  metoprolol tartrate (LOPRESSOR) 50 MG tablet Take 1 tablet (50 mg total)  by mouth 2 (two) times daily. Patient taking differently: Take 50 mg by mouth See admin instructions. Take 1 tablet (50 mg) by mouth in the morning and at 2pm then take 1/2 tablet (25 mg) at bedtime 09/01/21   Mercy Riding, MD  Multiple Vitamins-Minerals (PRESERVISION AREDS 2 PO) Take 1 tablet by mouth in the morning and at bedtime.    [provider]  torsemide (DEMADEX) 20 MG tablet Take 20 mg by mouth daily. 12/29/21   [provider]  traMADol (ULTRAM) 50 MG tablet Take 1 tablet (50 mg total) by mouth 2 (two) times daily as needed for moderate pain or severe pain. 02/18/22   Dwyane Dee, MD  vitamin E 400 UNIT capsule Take 400 Units by mouth 2 (two) times a week.    [provider]    Physical Exam: Vitals:   02/20/22 0510 02/20/22 0530 02/20/22 0600 02/20/22 0715  BP:  (!) 142/59 124/61 (!) 93/51  Pulse:  78 82 74  Resp: 20 (!) '21 19 19  '$ Temp: (!) 101.6 F (38.7 C)     TempSrc: Rectal     SpO2:  97% 100% 95%  Weight:      Height:       Physical Exam Vitals and nursing note reviewed.  Constitutional:      General: She is awake. She is not in acute distress.    Appearance: She is well-developed and  underweight. She is ill-appearing.     Comments: Chronically ill-appearing.  HENT:     Head: Normocephalic.     Nose: No rhinorrhea.     Mouth/Throat:     Mouth: Mucous membranes are dry.  Eyes:     General: No scleral icterus.    Pupils: Pupils are equal, round, and reactive to light.  Neck:     Vascular: No JVD.  Cardiovascular:     Rate and Rhythm: Normal rate and regular rhythm.     Heart sounds: S1 normal and S2 normal.  Pulmonary:     Breath sounds: Examination of the right-lower field reveals decreased breath sounds and rales. Examination of the left-lower field reveals decreased breath sounds and rales. Decreased breath sounds, rhonchi and rales present. No wheezing.  Abdominal:     General: Bowel sounds are normal. There is no distension.     Palpations: Abdomen is soft.     Tenderness: There is no abdominal tenderness.  Musculoskeletal:     Cervical back: Neck supple.     Right lower leg: No edema.     Left lower leg: No edema.  Skin:    General: Skin is warm and dry.  Neurological:     General: No focal deficit present.     Mental Status: She is alert and oriented to person, place, and time.  Psychiatric:        Mood and Affect: Mood normal.        Behavior: Behavior is cooperative.     Data Reviewed:  There are no new results to review at this time.  12/24/2019 echocardiogram  IMPRESSIONS    1. Left ventricular ejection fraction, by estimation, is 60 to 65%. The  left ventricle has normal function. The left ventricle has no regional  wall motion abnormalities. There is mild left ventricular hypertrophy of  the basal-septal segment. Left  ventricular diastolic parameters are consistent with Grade I diastolic  dysfunction (impaired relaxation).   2. Right ventricular systolic function is normal. The right ventricular  size is  normal.   3. The mitral valve is normal in structure. No evidence of mitral valve  regurgitation. No evidence of mitral stenosis.    4. The aortic valve is normal in structure. Aortic valve regurgitation is  not visualized. No aortic stenosis is present.   5. The inferior vena cava is normal in size with greater than 50%  respiratory variability, suggesting right atrial pressure of 3 mmHg.   Assessment and Plan: Principal Problem:   COPD with acute exacerbation ([place]) Associated with:   Acute exacerbation of bronchiectasis ([place]) In the setting of:   HCAP (healthcare-associated pneumonia) Admit to telemetry 3/inpatient. Continue time-limited IV fluids gently. Supplemental oxygen as needed. Scheduled bronchodilators. As needed beta agonist. Continue cefepime 1 g every 12 hours Continue vancomycin per pharmacy. Follow-up blood culture and sensitivity Check sputum Gram stain, culture and sensitivity. Check strep pneumoniae urinary antigen. Follow CBC and CMP in a.m.  Active Problems:   Chronic diastolic CHF (congestive heart failure) (HCC) No signs of decompensation. Blood pressure soft. Hold beta-blocker, ARB and diuretic.    GERD without esophagitis Continue PPI.    Normocytic anemia Monitor hematocrit and hemoglobin.    Hyperlipidemia Currently not on medical therapy. Follow-up with PCP as an outpatient.    Type 2 diabetes mellitus (HCC) Carb hydra modified diet. Continue glipizide 5 mg p.o. daily. CBG monitoring before meals and bedtime.    Stage 3a chronic kidney disease (CKD) (HCC) Monitor renal function and electrolytes.    Mild protein malnutrition (HCC) Supplementation. Consider nutritional services evaluation.     Advance Care Planning:   Code Status: Prior   Consults:   Family Communication:   Severity of Illness: The appropriate patient status for this patient is INPATIENT. Inpatient status is judged to be reasonable and necessary in order to provide the required intensity of service to ensure the patient's safety. The patient's presenting symptoms, physical exam findings, and  initial radiographic and laboratory data in the context of their chronic comorbidities is felt to place them at high risk for further clinical deterioration. Furthermore, it is not anticipated that the patient will be medically stable for discharge from the hospital within 2 midnights of admission.   * I certify that at the point of admission it is my clinical judgment that the patient will require inpatient hospital care spanning beyond 2 midnights from the point of admission due to high intensity of service, high risk for further deterioration and high frequency of surveillance required.*  Author: Reubin Milan, MD 02/20/2022 7:23 AM  For on call review [email or web address].   This document was prepared using Dragon voice recognition software and may contain some unintended transcription errors.

## 2022-02-20 NOTE — ED Triage Notes (Signed)
Pt complaining of pain everywhere.  Ems reports facility called for pt having shortness of breath baseline pt wears 1.5 l (spo2 90%)of o2. EMS increased o2 to 3l and pt o2 incresed to 94%. Pt has a hx of COPD.  Pt reports feeling more week today than normal.   BP- 118/60 Pulse 80 Spo2- 94% Cbg 169

## 2022-02-20 NOTE — ED Notes (Signed)
Pt in bed, sig other at bedside, pt answers questions appropriately, pt reports general pain, pt is oriented by slightly confused, pt unable to give numerical pain value.  Pt resting, pt has no requests at this time.

## 2022-02-20 NOTE — Progress Notes (Signed)
Pt's chart is marked that pt has blood product refusal. This blood product refusal is dated 09/27/21. While doing nursing admission history, I told pt that I need to verify that she still refuses blood products. Pt does not answer. There is a female visitor at the bedside who identifies himself as a friend of the patient. He states she does accept blood products.  Doroteo Bradford BSN, RN-BC Throughput Nurse 02/20/2022 7:50 PM

## 2022-02-20 NOTE — Progress Notes (Signed)
A consult was received from an ED physician for [place] per pharmacy dosing.  The patient's profile has been reviewed for ht/wt/allergies/indication/available labs.   A one time order has been placed for Vanco 1g IV x 1.  Further antibiotics/pharmacy consults should be ordered by admitting physician if indicated.                       Lavita Pontius S. Alford Highland, PharmD, BCPS Clinical Staff Pharmacist [email or web address] Thank you, Wayland Salinas 02/20/2022  6:15 AM

## 2022-02-20 NOTE — Progress Notes (Signed)
Pharmacy Antibiotic Note  Katie Alvarado is a 88 y.o. female who was recently hospitalized from 02/06/22 to 02/18/22 for COPD/bronchiectasis exacerbation. During this admission, she was treated with unasyn from 02/07/22 - 02/12/22 and augmentin from 02/12/22 - 02/18/22.  She presented back to the ED on 02/20/2022 with worsening of SOB.  CXR on 02/20/22 showed "new superimposed airspace disease within the left lower lobe concerning for pneumonia." Pharmacy has been consulted to dose vancomycin for PNA.   Plan: - vancomycin 1000 mg IV q48h for est AUC 523 - continue cefepime 2gm IV q24h per MD - monitor renal function closely  ________________________________________  Height: '5\' 9"'$  (175.3 cm) Weight: 56.7 kg (125 lb) IBW/kg (Calculated) : 66.2  Temp (24hrs), Avg:100.1 F (37.8 C), Min:98.4 F (36.9 C), Max:101.6 F (38.7 C)  Recent Labs  Lab 02/14/22 0451 02/20/22 0513 02/20/22 0658  WBC 8.8 8.8  --   CREATININE 1.37* 1.36*  --   LATICACIDVEN  --  1.4 1.0    Estimated Creatinine Clearance: 22.6 mL/min (A) (by C-G formula based on SCr of 1.36 mg/dL (H)).    Allergies  Allergen Reactions   Welchol [Colesevelam Hcl] Hives   Amlodipine     Ankle swelling   Atorvastatin     Other reaction(s): foot pain   Coreg [Carvedilol]     dizzy   Doxycycline     rash   Glucosamine Itching and Other (See Comments)    "Breakouts"   Hydrocodone-Acetaminophen     Other reaction(s): did not feel right   Mobic [Meloxicam] Other (See Comments)    Unknown reaction   Sulfa Antibiotics     itching   Telmisartan     itching   Tramadol Nausea Only    *can tolerate 1/2 dose" per husband at bedside   Celecoxib Hives, Itching and Rash   Statins Rash    Thank you for allowing pharmacy to be a part of this patient's care.  Lynelle Doctor 02/20/2022 9:36 AM

## 2022-02-21 DIAGNOSIS — J189 Pneumonia, unspecified organism: Secondary | ICD-10-CM | POA: Diagnosis not present

## 2022-02-21 LAB — CBC
HCT: 31.4 % — ABNORMAL LOW (ref 36.0–46.0)
Hemoglobin: 9.9 g/dL — ABNORMAL LOW (ref 12.0–15.0)
MCH: 28.5 pg (ref 26.0–34.0)
MCHC: 31.5 g/dL (ref 30.0–36.0)
MCV: 90.5 fL (ref 80.0–100.0)
Platelets: 211 K/uL (ref 150–400)
RBC: 3.47 MIL/uL — ABNORMAL LOW (ref 3.87–5.11)
RDW: 14 % (ref 11.5–15.5)
WBC: 6.5 K/uL (ref 4.0–10.5)
nRBC: 0 % (ref 0.0–0.2)

## 2022-02-21 LAB — URINE CULTURE: Culture: NO GROWTH

## 2022-02-21 LAB — COMPREHENSIVE METABOLIC PANEL
ALT: 9 U/L (ref 0–44)
AST: 10 U/L — ABNORMAL LOW (ref 15–41)
Albumin: 2.6 g/dL — ABNORMAL LOW (ref 3.5–5.0)
Alkaline Phosphatase: 48 U/L (ref 38–126)
Anion gap: 8 (ref 5–15)
BUN: 24 mg/dL — ABNORMAL HIGH (ref 8–23)
CO2: 27 mmol/L (ref 22–32)
Calcium: 8.6 mg/dL — ABNORMAL LOW (ref 8.9–10.3)
Chloride: 100 mmol/L (ref 98–111)
Creatinine, Ser: 0.95 mg/dL (ref 0.44–1.00)
GFR, Estimated: 56 mL/min — ABNORMAL LOW (ref >60)
Glucose, Bld: 137 mg/dL — ABNORMAL HIGH (ref 70–99)
Potassium: 3.6 mmol/L (ref 3.5–5.1)
Sodium: 135 mmol/L (ref 135–145)
Total Bilirubin: 0.7 mg/dL (ref 0.3–1.2)
Total Protein: 5.4 g/dL — ABNORMAL LOW (ref 6.5–8.1)

## 2022-02-21 LAB — GLUCOSE, CAPILLARY
Glucose-Capillary: 156 mg/dL — ABNORMAL HIGH (ref 70–99)
Glucose-Capillary: 107 mg/dL — ABNORMAL HIGH (ref 70–99)
Glucose-Capillary: 118 mg/dL — ABNORMAL HIGH (ref 70–99)
Glucose-Capillary: 129 mg/dL — ABNORMAL HIGH (ref 70–99)

## 2022-02-21 MED ORDER — SODIUM CHLORIDE 0.9 % IV SOLN
2.0000 g | Freq: Two times a day (BID) | INTRAVENOUS | Status: DC
  Administered 2022-02-21 – 2022-02-23 (×4): 2 g via INTRAVENOUS
  Filled 2022-02-21 (×4): qty 12.5

## 2022-02-21 MED ORDER — GUAIFENESIN 100 MG/5ML PO LIQD
5.0000 mL | ORAL | Status: DC | PRN
  Administered 2022-02-21 – 2022-02-23 (×6): 5 mL via ORAL
  Filled 2022-02-21 (×6): qty 10

## 2022-02-21 MED ORDER — VANCOMYCIN HCL IN DEXTROSE 1-5 GM/200ML-% IV SOLN
1000.0000 mg | INTRAVENOUS | Status: DC
  Administered 2022-02-21 – 2022-02-23 (×2): 1000 mg via INTRAVENOUS
  Filled 2022-02-21 (×2): qty 200

## 2022-02-21 MED ORDER — IRBESARTAN 300 MG PO TABS
300.0000 mg | ORAL_TABLET | Freq: Every day | ORAL | Status: DC
  Administered 2022-02-21 – 2022-02-23 (×3): 300 mg via ORAL
  Filled 2022-02-21 (×3): qty 1

## 2022-02-21 MED ORDER — METOPROLOL TARTRATE 50 MG PO TABS
50.0000 mg | ORAL_TABLET | Freq: Two times a day (BID) | ORAL | Status: DC
  Administered 2022-02-21 – 2022-02-23 (×4): 50 mg via ORAL
  Filled 2022-02-21 (×4): qty 1

## 2022-02-21 NOTE — Progress Notes (Signed)
Pharmacy Antibiotic Note  Katie Alvarado is a 88 y.o. female who was recently hospitalized from 02/06/22 to 02/18/22 for COPD/bronchiectasis exacerbation. During this admission, she was treated with unasyn from 02/07/22 - 02/12/22 and augmentin from 02/12/22 - 02/18/22.  She presented back to the ED on 02/20/2022 with worsening of SOB.  CXR on 02/20/22 showed "new superimposed airspace disease within the left lower lobe concerning for pneumonia." Pharmacy has been consulted to dose vancomycin for PNA.  Today, Scr has trended down to 0.95. Will adjust antibiotic doses due to improvement.   Plan: - vancomycin 1000 mg IV q36h for est AUC 521 - Increase  cefepime to 2gm IV q12h per MD - monitor renal function closely  ________________________________________  Height: '5\' 9"'$  (175.3 cm) Weight: 56.7 kg (125 lb) IBW/kg (Calculated) : 66.2  Temp (24hrs), Avg:98.2 F (36.8 C), Min:97.7 F (36.5 C), Max:98.9 F (37.2 C)  Recent Labs  Lab 02/20/22 0513 02/20/22 0658 02/21/22 0414  WBC 8.8  --  6.5  CREATININE 1.36*  --  0.95  LATICACIDVEN 1.4 1.0  --      Estimated Creatinine Clearance: 32.4 mL/min (by C-G formula based on SCr of 0.95 mg/dL).    Allergies  Allergen Reactions   Welchol [Colesevelam Hcl] Hives   Coreg [Carvedilol] Other (See Comments)    Dizziness    Micardis [Telmisartan] Itching   Mobic [Meloxicam] Other (See Comments)    Unknown reaction   Norco [Hydrocodone-Acetaminophen] Other (See Comments)    "Did not feel right"   Norvasc [Amlodipine] Swelling    Ankle swelling   Sulfa Antibiotics Itching   Ultram [Tramadol] Nausea Only    Pt OK to take half tablets ('25mg'$  doses)   Celebrex [Celecoxib] Hives, Itching and Rash   Glucosamine Itching and Rash   Lipitor [Atorvastatin] Rash and Other (See Comments)    Foot pain   Statins Rash   Vibramycin [Doxycycline] Rash    Same reaction to Monohydrate and Hyclate    Thank you for allowing pharmacy to be a part of this  patient's care.   Royetta Asal, PharmD, BCPS 02/21/2022 10:40 AM

## 2022-02-21 NOTE — Progress Notes (Signed)
  Progress Note   Patient: Katie Alvarado RJJ:[id number] DOB: 1925/10/17 DOA: 02/20/2022     1 DOS: the patient was seen and examined on 02/21/2022   Brief hospital course: 88 y.o. female with medical history significant of common cold, surgical-itis, bronchiectasis, COPD on home oxygen at 1.5 LPM, skin cancer, type 2 diabetes, GERD, hypertension, history of pneumonia who was recently admitted and discharged from 02/06/2022 on 3 02/18/2022 due to acute respiratory failure secondary to COPD/bronchiectasis exacerbation and is returning today to the emergency department due to fever, chills, decreased appetite, generalized arthralgias/myalgias, wheezing and worsening dyspnea with mild increasing oxygen requirement since yesterday evening.  No night sweats. No sore throat, rhinorrhea or hemoptysis.  No chest pain, palpitations, diaphoresis, PND, orthopnea or pitting edema of the lower extremities.  No appetite changes, abdominal pain, diarrhea, constipation, melena or hematochezia.  No flank pain, dysuria, frequency or hematuria.  No polyuria, polydipsia, polyphagia or blurred vision  Assessment and Plan: Principal Problem:   COPD with acute exacerbation ([name]) Associated with:   Acute exacerbation of bronchiectasis ([name]) In the setting of:   HCAP (healthcare-associated pneumonia) Scheduled bronchodilators. As needed beta agonist. Continue cefepime 1 g every 12 hours Continue vancomycin per pharmacy. Follow-up blood culture and sensitivity F/u on sputum Gram stain, culture and sensitivity. Strep pneumoniae urinary antigen neg Required 2LNC this AM, now on RA Recheck CBC in AM   Active Problems:   Chronic diastolic CHF (congestive heart failure) (HCC) No signs of decompensation. Held beta-blocker, ARB, hydralazine and diuretic given concerns of soft bp -BP now elevated. Resume ARB and metoprolol     GERD without esophagitis Continue PPI.     Normocytic anemia Hemodynamically stable      Hyperlipidemia Currently not on medical therapy. Follow-up with PCP as an outpatient.     Type 2 diabetes mellitus (HCC) Carb hydra modified diet. Continue glipizide 5 mg p.o. daily. CBG monitoring before meals and bedtime.     Stage 3a chronic kidney disease (CKD) ([name]) Recheck bmet in AM     Mild protein malnutrition ([name]) Supplementation. Consider nutritional services evaluation.         Subjective: Without complaints. Reports feeling somewhat better today  Physical Exam: Vitals:   02/21/22 1137 02/21/22 1335 02/21/22 1423 02/21/22 1510  BP:  (!) 158/74    Pulse:  92    Resp:  19    Temp: 98.5 F (36.9 C) 99.4 F (37.4 C)    TempSrc: Oral Oral    SpO2:  97% 97% 94%  Weight:      Height:       General exam: Awake, laying in bed, in nad Respiratory system: Normal respiratory effort, no wheezing Cardiovascular system: regular rate, s1, s2 Gastrointestinal system: Soft, nondistended, positive BS Central nervous system: CN2-12 grossly intact, strength intact Extremities: Perfused, no clubbing Skin: Normal skin turgor, no notable skin lesions seen Psychiatry: Mood normal // no visual hallucinations   Data Reviewed:  Labs reviewed: Na 135, K 3.6, Cr 0.95  Family Communication: Pt in room, family not at bedside  Disposition: Status is: Inpatient Remains inpatient appropriate because: Severity of illness  Planned Discharge Destination: Home    Author: Marylu Lund, MD 02/21/2022 5:35 PM  For on call review [email or web address].

## 2022-02-21 NOTE — TOC Initial Note (Signed)
Transition of Care [facility]) - Initial/Assessment Note    Patient Details  Name: Katie Alvarado MRN: [id number] Date of Birth: 1926-03-04  [facility] CM/SW Contact:    Roseanne Kaufman, RN Phone Number: 02/21/2022, 1:30 PM  Clinical Narrative:  Patient previously admitted to [facility] on 10/30 then returned to [facility] on 02/20/22. Spoke with patient and friend Fritz Pickerel who reports patient does not wish to return to [facility] at discharge. Will await PT evaluation for recommendation.  Transportation at discharge: friend Freddy Finner will continue to follow for discharge needs    Barriers to Discharge: Continued Medical Work up   Patient Goals and CMS Choice     Choice offered to / list presented to : NA  Expected Discharge Plan and Services   In-house Referral: NA Discharge Planning Services: CM Consult   Living arrangements for the past 2 months: Single Family Home                   DME Agency: NA                  Prior Living Arrangements/Services Living arrangements for the past 2 months: Single Family Home Lives with:: Self, Friends Patient language and need for interpreter reviewed:: Yes        Need for Family Participation in Patient Care: No (Comment) Care giver support system in place?: Yes (comment) Current home services: Other (comment) (none) Criminal Activity/Legal Involvement Pertinent to Current Situation/Hospitalization: No - Comment as needed  Activities of Daily Living Home Assistive Devices/Equipment: Cane (specify quad or straight), Walker (specify type), Nebulizer, Eyeglasses, Shower chair with back, Bedside commode/3-in-1 ADL Screening (condition at time of admission) Patient's cognitive ability adequate to safely complete daily activities?: Yes Is the patient deaf or have difficulty hearing?: Yes (hoh in right ear) Does the patient have difficulty seeing, even when wearing glasses/contacts?: Yes (macular  degeneration in both eyes) Does the patient have difficulty concentrating, remembering, or making decisions?: No Patient able to express need for assistance with ADLs?: Yes Does the patient have difficulty dressing or bathing?: Yes Independently performs ADLs?: No Communication: Independent Dressing (OT): Needs assistance Is this a change from baseline?: Pre-admission baseline Grooming: Independent Feeding: Independent Bathing: Needs assistance Is this a change from baseline?: Pre-admission baseline Toileting: Needs assistance Is this a change from baseline?: Pre-admission baseline In/Out Bed: Needs assistance Is this a change from baseline?: Pre-admission baseline Walks in Home: Dependent Is this a change from baseline?: Pre-admission baseline Does the patient have difficulty walking or climbing stairs?: Yes Weakness of Legs: Both Weakness of Arms/Hands: Both  Permission Sought/Granted Permission sought to share information with : Case Manager Permission granted to share information with : Yes, Verbal Permission Granted              Emotional Assessment Appearance:: Appears stated age Attitude/Demeanor/Rapport: Engaged Affect (typically observed): Appropriate Orientation: : Oriented to Self, Oriented to Place, Oriented to  Time, Oriented to Situation Alcohol / Substance Use: Not Applicable Psych Involvement: No (comment)  Admission diagnosis:  HCAP (healthcare-associated pneumonia) [J18.9] Community acquired pneumonia of left lower lobe of lung [J18.9] Patient Active Problem List   Diagnosis Date Noted   HCAP (healthcare-associated pneumonia) 02/20/2022   Type 2 diabetes mellitus ([place]) 02/20/2022   Stage 3a chronic kidney disease (CKD) ([place]) 02/20/2022   Mild protein malnutrition ([place]) 02/20/2022   Acute bronchitis 08/30/2021  Acute respiratory failure with hypoxia ([place]) 08/30/2021   Pancreatic lesion 08/30/2021   Left groin pain 08/30/2021   2019 novel coronavirus  disease (COVID-19) 04/23/2021   Palpitations 02/23/2021   Malnutrition of moderate degree 01/03/2021   COPD with acute exacerbation ([place]) 01/02/2021   Chronic diastolic CHF (congestive heart failure) ([place]) 01/02/2021   Anxiety 09/14/2020   Bronchiolectasis ([place]) 09/14/2020   Chronic anxiety 09/14/2020   Drug-induced myopathy 09/14/2020   Edema 09/14/2020   Gout 09/14/2020   History of pneumonia 09/14/2020   History of squamous cell carcinoma 09/14/2020   Hyperlipidemia 09/14/2020   Hypertensive heart disease without congestive heart failure 09/14/2020   Impairment of balance 09/14/2020   Iron deficiency anemia 09/14/2020   Legal blindness, as defined in [place] 09/14/2020   Lumbar radiculopathy 09/14/2020   Lumbar spondylosis 09/14/2020   Microalbuminuria 09/14/2020   Osteoarthritis of knee 09/14/2020   Pseudogout 09/14/2020   Solitary pulmonary nodule 09/14/2020   Vitamin D deficiency 09/14/2020   Pain in right hand 08/08/2020   EKG abnormality 12/24/2019   Hypertensive urgency 12/24/2019   Acute hyponatremia 12/23/2019   Neck pain 12/13/2019   AKI (acute kidney injury) ([place]) 01/10/2019   Thrombocytosis 12/25/2018   Degeneration of lumbar intervertebral disc 10/28/2018   Left-sided chest wall pain 06/01/2018   Chronic back pain 05/05/2018   Low back pain radiating down leg 05/05/2018   Pneumonia of both lungs due to infectious organism 04/28/2018   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   Allergic urticaria 03/05/2016   Gastritis 03/05/2016   Chronic rhinitis 01/09/2016   Atypical mycobacterial infection of lung ([place])    Normocytic anemia 07/18/2015   Bronchiectasis without complication ([place]) 98/33/8250   Renal artery stenosis ([place]) 04/25/2015   Uncontrolled hypertension 04/25/2015   Lower extremity pain, bilateral 08/04/2012   Klebsiella infection 08/01/2012   Uncontrolled NIDDM-2 with hyperglycemia 07/29/2012   Generalized weakness 07/29/2012   Hyponatremia  04/11/2011   Leucocytosis 04/11/2011   BRONCHIECTASIS, + MAIC 03/25/2010   DIZZINESS 02/02/2010   MUSCULOSKELETAL PAIN 09/25/2007   INSOMNIA 05/20/2007   ACUTE NASOPHARYNGITIS 04/20/2007   Acute exacerbation of bronchiectasis ([place]) 04/20/2007   GERD without esophagitis 04/20/2007   PCP:  [name], MD Pharmacy:   [facility], [place] [address] Phone: [phone] Fax: [phone]  [id number] [place] [facility] [address] [place] [address]Phone: [phone]: [phone] [address] [address]Phone: [phone]: [phone]    Social Determinants of Health (SDOH) Interventions    Readmission Risk Interventions    02/18/2022   12:08 PM 02/14/2022   11:23 AM  Readmission Risk Prevention Plan  Transportation Screening Complete Complete  Medication Review (RN Care Manager) Complete Complete  PCP or Specialist appointment within 3-5 days of discharge Complete Complete  HRI or Home Care Consult Complete Complete  SW Recovery Care/Counseling Consult Complete Complete  Palliative Care Screening Not Applicable Not Applicable  Skilled Nursing Facility Complete Complete

## 2022-02-21 NOTE — Progress Notes (Signed)
Initial Nutrition Assessment  DOCUMENTATION CODES:  Non-severe (moderate) malnutrition in context of chronic illness  INTERVENTION:  -Continue current diet order as tolerated -Continue to offer low CHO high protein ONS (150kcal, 30g protein)  NUTRITION DIAGNOSIS:  Moderate Malnutrition related to chronic illness (COPD) as evidenced by moderate fat depletion, mild muscle depletion.  GOAL:  Patient will meet greater than or equal to 90% of their needs   MONITOR:  PO intake  REASON FOR ASSESSMENT:  Malnutrition Screening Tool   ASSESSMENT:   Pt is a 88yo F with PMH of bronchiectasis, COPD, skin cancer, T2DM, GERD, HTN, and pneumonia who was recently admitted due to acute respiratory failure secondary to COPD/bronchiectasis exacerbation who presents with fever, chills, decreased appetite, generalized arthralgias/myalgias, wheezing and worsening dyspnea with mild increasing oxygen requirement.  Visited pt at bedside this afternoon. She reports her appetite has significantly improved during admission. Prior to admission pt thinks she was losing weight but is unable to identify how much weight. She reports very poor appetite and intake but denies n/v. Pt noted to have productive cough that likely affected po intake. Pt declines all ONS because they raise her BG. NFPE shows moderate fat loss and mild to moderate muscle wasting. Pt meets ASPEN criteria for moderate protein calorie malnutrition r/t chronic illness.  Discussed food preferences to optimize po intake. Discussed low CHO ONS such as Ensure Max but she said they all raise her BG. Pt reports she will be going home with assistance from family and friends on discharge. Discussed the importance of regular meals and snacks and encouraged adequate protein intake. Pt has no nutrition related questions at this time.  Medications reviewed and include: aspirin, cholecalciferol, glipizide, protonix  Labs reviewed: BG:130-201, BUN:24, Corrected  Ca:9.3 (Ca:8.6, Albumin:2.6), AST:10, GFR:56, Hgb:9.9   NUTRITION - FOCUSED PHYSICAL EXAM:  Flowsheet Row Most Recent Value  Orbital Region Mild depletion  Upper Arm Region Severe depletion  Thoracic and Lumbar Region Moderate depletion  Buccal Region Moderate depletion  Temple Region Moderate depletion  Clavicle Bone Region Moderate depletion  Clavicle and Acromion Bone Region Moderate depletion  Scapular Bone Region Unable to assess  Dorsal Hand Severe depletion  Patellar Region Mild depletion  Anterior Thigh Region Mild depletion  Posterior Calf Region Mild depletion  Hair Reviewed  Eyes Reviewed  Mouth Reviewed  [bright red]  Skin Reviewed  Nails Reviewed       Diet Order:   Diet Order             Diet heart healthy/carb modified Room service appropriate? Yes; Fluid consistency: Thin  Diet effective now                   EDUCATION NEEDS:  Education needs have been addressed  Skin:  Skin Assessment: Reviewed RN Assessment  Last BM:  10/31  Height:  Ht Readings from Last 1 Encounters:  02/20/22 '5\' 9"'$  (1.753 m)   Weight:  Wt Readings from Last 1 Encounters:  02/20/22 56.7 kg    BMI:  Body mass index is 18.46 kg/m.  Estimated Nutritional Needs:  Kcal:  1420-1700kcal Protein:  70-85g Fluid:  1420-1736m  KCandise Bowens MS, RD, LDN, CNSC See AMiON for contact information

## 2022-02-21 NOTE — Hospital Course (Signed)
88 y.o. female with medical history significant of common cold, surgical-itis, bronchiectasis, COPD on home oxygen at 1.5 LPM, skin cancer, type 2 diabetes, GERD, hypertension, history of pneumonia who was recently admitted and discharged from 02/06/2022 on 3 02/18/2022 due to acute respiratory failure secondary to COPD/bronchiectasis exacerbation and is returning today to the emergency department due to fever, chills, decreased appetite, generalized arthralgias/myalgias, wheezing and worsening dyspnea with mild increasing oxygen requirement since yesterday evening.  No night sweats. No sore throat, rhinorrhea or hemoptysis.  No chest pain, palpitations, diaphoresis, PND, orthopnea or pitting edema of the lower extremities.  No appetite changes, abdominal pain, diarrhea, constipation, melena or hematochezia.  No flank pain, dysuria, frequency or hematuria.  No polyuria, polydipsia, polyphagia or blurred vision

## 2022-02-22 DIAGNOSIS — J189 Pneumonia, unspecified organism: Secondary | ICD-10-CM | POA: Diagnosis not present

## 2022-02-22 LAB — GLUCOSE, CAPILLARY
Glucose-Capillary: 100 mg/dL — ABNORMAL HIGH (ref 70–99)
Glucose-Capillary: 99 mg/dL (ref 70–99)
Glucose-Capillary: 159 mg/dL — ABNORMAL HIGH (ref 70–99)
Glucose-Capillary: 133 mg/dL — ABNORMAL HIGH (ref 70–99)

## 2022-02-22 LAB — COMPREHENSIVE METABOLIC PANEL
ALT: 8 U/L (ref 0–44)
AST: 10 U/L — ABNORMAL LOW (ref 15–41)
Albumin: 2.3 g/dL — ABNORMAL LOW (ref 3.5–5.0)
Alkaline Phosphatase: 45 U/L (ref 38–126)
Anion gap: 9 (ref 5–15)
BUN: 13 mg/dL (ref 8–23)
CO2: 25 mmol/L (ref 22–32)
Calcium: 8.2 mg/dL — ABNORMAL LOW (ref 8.9–10.3)
Chloride: 99 mmol/L (ref 98–111)
Creatinine, Ser: 0.79 mg/dL (ref 0.44–1.00)
GFR, Estimated: >60 mL/min (ref >60)
Glucose, Bld: 94 mg/dL (ref 70–99)
Potassium: 3.3 mmol/L — ABNORMAL LOW (ref 3.5–5.1)
Sodium: 133 mmol/L — ABNORMAL LOW (ref 135–145)
Total Bilirubin: 0.7 mg/dL (ref 0.3–1.2)
Total Protein: 5 g/dL — ABNORMAL LOW (ref 6.5–8.1)

## 2022-02-22 LAB — CBC
HCT: 29.3 % — ABNORMAL LOW (ref 36.0–46.0)
Hemoglobin: 9.3 g/dL — ABNORMAL LOW (ref 12.0–15.0)
MCH: 29.2 pg (ref 26.0–34.0)
MCHC: 31.7 g/dL (ref 30.0–36.0)
MCV: 91.8 fL (ref 80.0–100.0)
Platelets: 212 10*3/uL (ref 150–400)
RBC: 3.19 MIL/uL — ABNORMAL LOW (ref 3.87–5.11)
RDW: 13.6 % (ref 11.5–15.5)
WBC: 7.3 10*3/uL (ref 4.0–10.5)
nRBC: 0 % (ref 0.0–0.2)

## 2022-02-22 MED ORDER — IPRATROPIUM-ALBUTEROL 0.5-2.5 (3) MG/3ML IN SOLN
3.0000 mL | Freq: Two times a day (BID) | RESPIRATORY_TRACT | Status: DC
  Administered 2022-02-22 – 2022-02-23 (×2): 3 mL via RESPIRATORY_TRACT
  Filled 2022-02-22 (×2): qty 3

## 2022-02-22 MED ORDER — POTASSIUM CHLORIDE CRYS ER 20 MEQ PO TBCR
60.0000 meq | EXTENDED_RELEASE_TABLET | Freq: Once | ORAL | Status: AC
  Administered 2022-02-22: 60 meq via ORAL
  Filled 2022-02-22: qty 3

## 2022-02-22 NOTE — Progress Notes (Signed)
  Progress Note   Patient: Katie Alvarado:[id number] DOB: [birth year] DOA: 02/20/2022     2 DOS: the patient was seen and examined on 02/22/2022   Brief hospital course: 88 y.o. female with medical history significant of common cold, surgical-itis, bronchiectasis, COPD on home oxygen at 1.5 LPM, skin cancer, type 2 diabetes, GERD, hypertension, history of pneumonia who was recently admitted and discharged from 02/06/2022 on 3 02/18/2022 due to acute respiratory failure secondary to COPD/bronchiectasis exacerbation and is returning today to the emergency department due to fever, chills, decreased appetite, generalized arthralgias/myalgias, wheezing and worsening dyspnea with mild increasing oxygen requirement since yesterday evening.  No night sweats. No sore throat, rhinorrhea or hemoptysis.  No chest pain, palpitations, diaphoresis, PND, orthopnea or pitting edema of the lower extremities.  No appetite changes, abdominal pain, diarrhea, constipation, melena or hematochezia.  No flank pain, dysuria, frequency or hematuria.  No polyuria, polydipsia, polyphagia or blurred vision  Assessment and Plan: Principal Problem:   COPD with acute exacerbation ([place]) Associated with:   Acute exacerbation of bronchiectasis ([place]) In the setting of:   HCAP (healthcare-associated pneumonia) Scheduled bronchodilators. As needed beta agonist. Continue cefepime 1 g every 12 hours Continue vancomycin per pharmacy. Follow-up blood culture and sensitivity F/u on sputum Gram stain, culture and sensitivity. Strep pneumoniae urinary antigen neg Required 2LNC this AM, now on minimal o2 No leukocytosis   Active Problems:   Chronic diastolic CHF (congestive heart failure) (HCC) No signs of decompensation. Initially held beta-blocker, ARB, hydralazine and diuretic given concerns of soft bp -Now on ARB and metoprolol     GERD without esophagitis Continue PPI.     Normocytic anemia Hemodynamically stable      Hyperlipidemia Currently not on medical therapy. Follow-up with PCP as an outpatient.     Type 2 diabetes mellitus (HCC) Carb hydra modified diet. Continue glipizide 5 mg p.o. daily. CBG monitoring before meals and bedtime.     Stage 3a chronic kidney disease (CKD) ([place]) Recheck bmet in AM     Mild protein malnutrition ([place]) Supplementation. Consider nutritional services evaluation.      Subjective: States feeling better  Physical Exam: Vitals:   02/21/22 2031 02/22/22 0431 02/22/22 0911 02/22/22 1302  BP: 132/60 (!) 131/55  130/61  Pulse: 92 63  71  Resp: (!) '21 18  16  '$ Temp: 99.7 F (37.6 C) 98.7 F (37.1 C)  97.9 F (36.6 C)  TempSrc: Oral Oral  Oral  SpO2: 96% 98% 97% 95%  Weight:      Height:       General exam: Conversant, in no acute distress Respiratory system: normal chest rise, clear, no audible wheezing Cardiovascular system: regular rhythm, s1-s2 Gastrointestinal system: Nondistended, nontender, pos BS Central nervous system: No seizures, no tremors Extremities: No cyanosis, no joint deformities Skin: No rashes, no pallor Psychiatry: Affect normal // no auditory hallucinations   Data Reviewed:  Labs reviewed: Na 133, K 3.3, Cr 0.79  Family Communication: Pt in room, family not at bedside  Disposition: Status is: Inpatient Remains inpatient appropriate because: Severity of illness  Planned Discharge Destination: Home    Author: Marylu Lund, MD 02/22/2022 5:10 PM  For on call review [email or web address].

## 2022-02-22 NOTE — Evaluation (Signed)
Occupational Therapy Evaluation Patient Details Name: Katie Alvarado MRN: [id number] DOB: Dec 17, 1925 Today's Date: 02/22/2022   History of Present Illness 88 y.o. female with medical history significant of common cold, surgical-itis, bronchiectasis, COPD on home oxygen at 1.5  LPM, skin cancer, type 2 diabetes, GERD, hypertension, history of pneumonia who was recently admitted and discharged from 02/06/2022 on 3 02/18/2022 due to acute respiratory failure secondary to COPD/bronchiectasis exacerbation and readmitted today for pneumonia.   Clinical Impression   Katie Alvarado is a very pleasant 88 year old who is admitted with above medical history. On evaluation she presents on RA in supine at 94%. On evaluation she was supervision to transfer to edge of bed, min-mod assist to stand depending on surface height, and min assist to ambulate with walker. She was able to ambulate to bathroom and needed assistance for toilet transfer. She needed assistance for clothing during toileting and she is typically able to manage that at home. She has assistance for LB ADLs from caregiver. Patient will benefit from skilled OT services while in hospital to improve deficits and learn compensatory strategies as needed in order to return to PLOF.  Will follow acutely. She reports not finding HH therapy to be overly helpful. Recommend initial increased assistance at home at discharge.      Recommendations for follow up therapy are one component of a multi-disciplinary discharge planning process, led by the attending physician.  Recommendations may be updated based on patient status, additional functional criteria and insurance authorization.   Follow Up Recommendations  No OT follow up    Assistance Recommended at Discharge Frequent or constant Supervision/Assistance  Patient can return home with the following A lot of help with bathing/dressing/bathroom;Direct supervision/assist for medications management;Direct  supervision/assist for financial management;Assist for transportation;Assistance with cooking/housework;Help with stairs or ramp for entrance;A little help with walking and/or transfers    Functional Status Assessment  Patient has had a recent decline in their functional status and demonstrates the ability to make significant improvements in function in a reasonable and predictable amount of time.  Equipment Recommendations  None recommended by OT    Recommendations for Other Services       Precautions / Restrictions Precautions Precautions: Fall Restrictions Weight Bearing Restrictions: No      Mobility Bed Mobility Overal bed mobility: Needs Assistance Bed Mobility: Supine to Sit     Supine to sit: Supervision     General bed mobility comments: increased time and use of bed rail    Transfers Overall transfer level: Needs assistance Equipment used: Rolling walker (2 wheels) Transfers: Sit to/from Stand Sit to Stand: Mod assist, Min assist           General transfer comment: Mod assist from lower surface, min assist from highter. Initial steadying from posterior bias with standing and ambulation.      Balance Overall balance assessment: Needs assistance Sitting-balance support: No upper extremity supported, Feet supported Sitting balance-Leahy Scale: Good     Standing balance support: During functional activity, Reliant on assistive device for balance, Bilateral upper extremity supported Standing balance-Leahy Scale: Poor                             ADL either performed or assessed with clinical judgement   ADL Overall ADL's : Needs assistance/impaired Eating/Feeding: Set up;Sitting   Grooming: Set up;Sitting;Wash/dry hands;Wash/dry face   Upper Body Bathing: Set up;Sitting   Lower Body Bathing: Minimal assistance;Sit  to/from stand   Upper Body Dressing : Set up;Sitting   Lower Body Dressing: Maximal assistance;Sit to/from stand   Toilet  Transfer: Ambulation;Rolling walker (2 wheels);Moderate assistance Toilet Transfer Details (indicate cue type and reason): Mod to power up from low toilet Toileting- Clothing Manipulation and Hygiene: Moderate assistance;Sit to/from stand Toileting - Clothing Manipulation Details (indicate cue type and reason): for clothing management able to perform pericare     Functional mobility during ADLs: Minimal assistance;Rolling walker (2 wheels) General ADL Comments: Min assist to steady with ambulation and walker     Vision Baseline Vision/History: 6 Macular Degeneration Ability to See in Adequate Light: 2 Moderately impaired Patient Visual Report: No change from baseline       Perception     Praxis      Pertinent Vitals/Pain Pain Assessment Pain Assessment: No/denies pain     Hand Dominance Right   Extremity/Trunk Assessment Upper Extremity Assessment LUE Deficits / Details: Very minimal L shoulder AROM which is chronic, otherwise B UE AROM WFL. B UE grip strength 4-/5   Lower Extremity Assessment Lower Extremity Assessment: Defer to PT evaluation   Cervical / Trunk Assessment Cervical / Trunk Assessment: Kyphotic   Communication Communication Communication: No difficulties   Cognition Arousal/Alertness: Awake/alert Behavior During Therapy: WFL for tasks assessed/performed Overall Cognitive Status: Within Functional Limits for tasks assessed                                 General Comments: Very sweet and mentally sharp.     General Comments       Exercises     Shoulder Instructions      Home Living Family/patient expects to be discharged to:: Private residence Living Arrangements: Alone Available Help at Discharge: Personal care attendant Type of Home: House Home Access: Ramped entrance     [place]: One level     Bathroom Shower/Tub: Occupational psychologist: Handicapped height Bathroom Accessibility: Yes   [place]:  Rollator (4 wheels);BSC/3in1;Shower seat - built in   Additional Comments: has assistance with bathing      Prior Functioning/Environment Prior Level of Function : Needs assist             Mobility Comments: typically ambulatory with rollator inside the home ADLs Comments: [place] 8A-12A Mon-Wed and Fri. Fritz Pickerel, friend, comes at 33 PM and will spend the night if she feels poorly.  He takes care of dinner and takes her to MD appointments.The patient is normally modified independent with toileting. PCA helps with bathing, dressing, breakfast and lunch and other IADLs        OT Problem List: Decreased strength;Decreased range of motion;Decreased activity tolerance;Impaired balance (sitting and/or standing);Impaired vision/perception;Cardiopulmonary status limiting activity      OT Treatment/Interventions: Self-care/ADL training;Therapeutic activities;Therapeutic exercise;Energy conservation;DME and/or AE instruction;Balance training    OT Goals(Current goals can be found in the care plan section) Acute Rehab OT Goals Patient Stated Goal: get better and go hoem OT Goal Formulation: With patient Time For Goal Achievement: 03/08/22 Potential to Achieve Goals: Good  OT Frequency: Min 2X/week    Co-evaluation              AM-PAC OT "6 Clicks" Daily Activity     Outcome Measure Help from another person eating meals?: A Little Help from another person taking care of personal grooming?: A Little Help from another person toileting, which includes using  toliet, bedpan, or urinal?: A Lot Help from another person bathing (including washing, rinsing, drying)?: A Lot Help from another person to put on and taking off regular upper body clothing?: A Little Help from another person to put on and taking off regular lower body clothing?: A Lot 6 Click Score: 15   End of Session Equipment Utilized During Treatment: Gait belt;Rolling walker (2 wheels) Nurse Communication: Mobility  status  Activity Tolerance: Patient tolerated treatment well Patient left: in chair;with call bell/phone within reach;with chair alarm set  OT Visit Diagnosis: Unsteadiness on feet (R26.81);Muscle weakness (generalized) (M62.81)                Time: 6433-2951 OT Time Calculation (min): 38 min Charges:  OT General Charges $OT Visit: 1 Visit OT Evaluation $OT Eval Low Complexity: 1 Low OT Treatments $Self Care/Home Management : 23-37 mins  Gustavo Lah, OTR/L [place]  Office [phone]   Lenward Chancellor 02/22/2022, 12:21 PM

## 2022-02-23 DIAGNOSIS — J189 Pneumonia, unspecified organism: Secondary | ICD-10-CM | POA: Diagnosis not present

## 2022-02-23 LAB — COMPREHENSIVE METABOLIC PANEL
ALT: 12 U/L (ref 0–44)
AST: 12 U/L — ABNORMAL LOW (ref 15–41)
Albumin: 2.5 g/dL — ABNORMAL LOW (ref 3.5–5.0)
Alkaline Phosphatase: 55 U/L (ref 38–126)
Anion gap: 7 (ref 5–15)
BUN: 16 mg/dL (ref 8–23)
CO2: 22 mmol/L (ref 22–32)
Calcium: 8.5 mg/dL — ABNORMAL LOW (ref 8.9–10.3)
Chloride: 107 mmol/L (ref 98–111)
Creatinine, Ser: 0.79 mg/dL (ref 0.44–1.00)
GFR, Estimated: >60 mL/min (ref >60)
Glucose, Bld: 107 mg/dL — ABNORMAL HIGH (ref 70–99)
Potassium: 4.2 mmol/L (ref 3.5–5.1)
Sodium: 136 mmol/L (ref 135–145)
Total Bilirubin: 0.4 mg/dL (ref 0.3–1.2)
Total Protein: 5.3 g/dL — ABNORMAL LOW (ref 6.5–8.1)

## 2022-02-23 LAB — CBC
HCT: 29.2 % — ABNORMAL LOW (ref 36.0–46.0)
Hemoglobin: 9.2 g/dL — ABNORMAL LOW (ref 12.0–15.0)
MCH: 28.8 pg (ref 26.0–34.0)
MCHC: 31.5 g/dL (ref 30.0–36.0)
MCV: 91.5 fL (ref 80.0–100.0)
Platelets: 211 10*3/uL (ref 150–400)
RBC: 3.19 MIL/uL — ABNORMAL LOW (ref 3.87–5.11)
RDW: 13.5 % (ref 11.5–15.5)
WBC: 6.6 10*3/uL (ref 4.0–10.5)
nRBC: 0 % (ref 0.0–0.2)

## 2022-02-23 LAB — GLUCOSE, CAPILLARY: Glucose-Capillary: 103 mg/dL — ABNORMAL HIGH (ref 70–99)

## 2022-02-23 LAB — MRSA NEXT GEN BY PCR, NASAL: MRSA by PCR Next Gen: NOT DETECTED

## 2022-02-23 MED ORDER — AZITHROMYCIN 500 MG PO TABS: 500.0000 mg | ORAL_TABLET | Freq: Every day | ORAL | 0 refills | Status: AC

## 2022-02-23 MED ORDER — CEFDINIR 300 MG PO CAPS: 300.0000 mg | ORAL_CAPSULE | Freq: Two times a day (BID) | ORAL | 0 refills | Status: AC

## 2022-02-23 NOTE — Evaluation (Signed)
Physical Therapy Evaluation Patient Details Name: Katie Alvarado MRN: [id number] DOB: Jan 15, 1926 Today's Date: 02/23/2022  History of Present Illness  Pt is a 88 y.o. female with medical history significant of common cold, surgical-itis, bronchiectasis, COPD on home oxygen at 1.5  LPM, skin cancer, type 2 diabetes, GERD, hypertension, history of pneumonia who was recently admitted and discharged from 02/06/2022 on 3 02/18/2022 due to acute respiratory failure secondary to COPD/bronchiectasis exacerbation and readmitted today for pneumonia.   Clinical Impression  Pt is a 88 y.o. female with above HPI resulting in the deficits listed below (see PT Problem List). Pt performed sit to stand transfers with MIN A and cues for safe hand placement. Pt ambulated total of ~47f with MIN guard and use of rollator.  O2 >90% on RA. Pt will have assist from friends and caregiver upon d/c. Pt will benefit from skilled PT to maximize functional mobility to increase independence.         Recommendations for follow up therapy are one component of a multi-disciplinary discharge planning process, led by the attending physician.  Recommendations may be updated based on patient status, additional functional criteria and insurance authorization.  Follow Up Recommendations No PT follow up      Assistance Recommended at Discharge Intermittent Supervision/Assistance  Patient can return home with the following       Equipment Recommendations None recommended by PT  Recommendations for Other Services       Functional Status Assessment Patient has had a recent decline in their functional status and demonstrates the ability to make significant improvements in function in a reasonable and predictable amount of time.     Precautions / Restrictions Precautions Precautions: Fall Restrictions Weight Bearing Restrictions: No      Mobility  Bed Mobility Overal bed mobility: Needs Assistance Bed Mobility: Supine to  Sit     Supine to sit: Supervision     General bed mobility comments: increased time and use of bed rail    Transfers Overall transfer level: Needs assistance Equipment used: Rolling walker (2 wheels) Transfers: Sit to/from Stand Sit to Stand: Min assist           General transfer comment: MIN A from elevated surface, increased bed height to simulate home environment. cues for locking brakes prior to stand.    Ambulation/Gait Ambulation/Gait assistance: Min guard Gait Distance (Feet): 90 Feet Assistive device: Rollator (4 wheels) Gait Pattern/deviations: Decreased stride length, Step-through pattern, Trunk flexed Gait velocity: decreased     General Gait Details: O2 >90% during mobility . No LOB observed  Stairs            Wheelchair Mobility    Modified Rankin (Stroke Patients Only)       Balance Overall balance assessment: Needs assistance Sitting-balance support: No upper extremity supported, Feet supported Sitting balance-Leahy Scale: Good     Standing balance support: During functional activity, Reliant on assistive device for balance, Bilateral upper extremity supported Standing balance-Leahy Scale: Poor                               Pertinent Vitals/Pain Pain Assessment Pain Assessment: No/denies pain    Home Living Family/patient expects to be discharged to:: Private residence Living Arrangements: Alone Available Help at Discharge: Personal care attendant Type of Home: House Home Access: Ramped entrance       Home Layout: One level Home Equipment: Rollator (4 wheels);BSC/3in1;Shower seat - built in  Additional Comments: has assistance with bathing    Prior Function Prior Level of Function : Needs assist             Mobility Comments: typically ambulatory with rollator inside the home ADLs Comments: [place] 8A-12A Mon-Wed and Fri. Fritz Pickerel, friend, comes at 78 PM and will spend the night if she feels poorly.  He  takes care of dinner and takes her to MD appointments.The patient is normally modified independent with toileting. PCA helps with bathing, dressing, breakfast and lunch and other IADLs     Hand Dominance   Dominant Hand: Right    Extremity/Trunk Assessment   Upper Extremity Assessment Upper Extremity Assessment: Defer to OT evaluation    Lower Extremity Assessment Lower Extremity Assessment: Generalized weakness    Cervical / Trunk Assessment Cervical / Trunk Assessment: Kyphotic  Communication   Communication: No difficulties  Cognition Arousal/Alertness: Awake/alert Behavior During Therapy: WFL for tasks assessed/performed Overall Cognitive Status: Within Functional Limits for tasks assessed                                 General Comments: pt very pleasant and able to answer all questions appropriately        General Comments      Exercises     Assessment/Plan    PT Assessment Patient needs continued PT services  PT Problem List Decreased strength;Decreased mobility;Decreased balance;Decreased knowledge of use of DME       PT Treatment Interventions DME instruction;Gait training;Functional mobility training;Therapeutic activities;Therapeutic exercise;Balance training;Patient/family education    PT Goals (Current goals can be found in the Care Plan section)  Acute Rehab PT Goals Patient Stated Goal: Pt wants to go home. PT Goal Formulation: With patient Time For Goal Achievement: 03/09/22 Potential to Achieve Goals: Good    Frequency Min 3X/week     Co-evaluation               AM-PAC PT "6 Clicks" Mobility  Outcome Measure Help needed turning from your back to your side while in a flat bed without using bedrails?: None Help needed moving from lying on your back to sitting on the side of a flat bed without using bedrails?: A Little Help needed moving to and from a bed to a chair (including a wheelchair)?: A Little Help needed  standing up from a chair using your arms (e.g., wheelchair or bedside chair)?: A Little Help needed to walk in hospital room?: A Little Help needed climbing 3-5 steps with a railing? : A Little 6 Click Score: 19    End of Session Equipment Utilized During Treatment: Gait belt Activity Tolerance: Patient tolerated treatment well Patient left: in chair;with call bell/phone within reach;with chair alarm set (IV team in room) Nurse Communication: Mobility status PT Visit Diagnosis: Unsteadiness on feet (R26.81);Other abnormalities of gait and mobility (R26.89);Difficulty in walking, not elsewhere classified (R26.2)    Time: 8315-1761 PT Time Calculation (min) (ACUTE ONLY): 21 min   Charges:   PT Evaluation $PT Eval Low Complexity: 1 Low          Festus Barren PT, DPT  Acute Rehabilitation Services  Office [phone]   02/23/2022, 12:46 PM

## 2022-02-23 NOTE — TOC Transition Note (Signed)
Transition of Care [facility]) - CM/SW Discharge Note   Patient Details  Name: Katie Alvarado MRN: [id number] Date of Birth: 10/06/1925  [facility] of [facility] CM/SW Contact:  Rodney Booze, LCSW Phone Number: 02/23/2022, 3:43 PM   Clinical Narrative:     Patient is wanting to go home with with home health. CSW set up [facility] PT with [facility] for the patient. At this time the patient has no other TOC needs.   Final next level of care: Home/Self Care Barriers to Discharge: No Barriers Identified   Patient Goals and CMS Choice Patient states their goals for this hospitalization and ongoing recovery are:: Home Health PT   Choice offered to / list presented to : Patient  Discharge Placement                       Discharge Plan and Services In-house Referral: NA Discharge Planning Services: CM Consult              DME Agency: NA       HH Arranged: PT HH Agency: [place] Date [place]: 02/23/22 Time [place]: [place] Representative spoke with at [place]: [place] ([place]) Interventions     Readmission Risk Interventions    02/18/2022   12:08 PM 02/14/2022   11:23 AM  Readmission Risk Prevention Plan  Transportation Screening Complete Complete  Medication Review Press photographer) Complete Complete  PCP or Specialist appointment within 3-5 days of discharge Complete Complete  HRI or Home Care Consult Complete Complete  SW Recovery Care/Counseling Consult Complete Complete  Palliative Care Screening Not Applicable Not Applicable  Skilled Nursing Facility Complete Complete

## 2022-02-23 NOTE — Discharge Summary (Signed)
Physician Discharge Summary   Patient: Katie Alvarado MRN: [id number] DOB: Dec 16, 1925  Admit date:     02/20/2022  Discharge date: 02/23/22  Discharge Physician: Marylu Lund   PCP: [name], MD   Recommendations at discharge:    Follow up with PCP in 1-2 weeks  Discharge Diagnoses: Principal Problem:   HCAP (healthcare-associated pneumonia) Active Problems:   Chronic diastolic CHF (congestive heart failure) (HCC)   Acute exacerbation of bronchiectasis (HCC)   GERD without esophagitis   Normocytic anemia   Hyperlipidemia   COPD with acute exacerbation (HCC)   Type 2 diabetes mellitus (HCC)   Stage 3a chronic kidney disease (CKD) (HCC)   Mild protein malnutrition (HCC)  Resolved Problems:   * No resolved hospital problems. *  Hospital Course: 88 y.o. female with medical history significant of common cold, surgical-itis, bronchiectasis, COPD on home oxygen at 1.5 LPM, skin cancer, type 2 diabetes, GERD, hypertension, history of pneumonia who was recently admitted and discharged from 02/06/2022 on 3 02/18/2022 due to acute respiratory failure secondary to COPD/bronchiectasis exacerbation and is returning today to the emergency department due to fever, chills, decreased appetite, generalized arthralgias/myalgias, wheezing and worsening dyspnea with mild increasing oxygen requirement since yesterday evening.  No night sweats. No sore throat, rhinorrhea or hemoptysis.  No chest pain, palpitations, diaphoresis, PND, orthopnea or pitting edema of the lower extremities.  No appetite changes, abdominal pain, diarrhea, constipation, melena or hematochezia.  No flank pain, dysuria, frequency or hematuria.  No polyuria, polydipsia, polyphagia or blurred vision  Assessment and Plan: Principal Problem:   COPD with acute exacerbation ([place]) Associated with:   Acute exacerbation of bronchiectasis ([place]) In the setting of:   HCAP (healthcare-associated pneumonia) Scheduled  bronchodilators. As needed beta agonist. Patiet was continued on cefepime and vanc, to complete course of omnicef and azithromycin on d/c Strep pneumoniae urinary antigen neg Weaned to room air No leukocytosis   Active Problems:   Chronic diastolic CHF (congestive heart failure) (HCC) No signs of decompensation. Initially held beta-blocker, ARB, hydralazine and diuretic given concerns of soft bp -BP remained stable -Resume home meds on d/c     GERD without esophagitis Continue PPI.     Normocytic anemia Hemodynamically stable     Hyperlipidemia Currently not on medical therapy. Follow-up with PCP as an outpatient.     Type 2 diabetes mellitus (HCC) Carb hydra modified diet. Continued glipizide 5 mg p.o. daily. Resume home meds on d/c     Stage 3a chronic kidney disease (CKD) ([place]) Renal function remained stable     Mild protein malnutrition ([place]) Supplementation.      Consultants:  Procedures performed:   Disposition: Home Diet recommendation:  Carb modified diet DISCHARGE MEDICATION: Allergies as of 02/23/2022       Reactions   Welchol [colesevelam Hcl] Hives   Coreg [carvedilol] Other (See Comments)   Dizziness    Micardis [telmisartan] Itching   Mobic [meloxicam] Other (See Comments)   Unknown reaction   Norco [hydrocodone-acetaminophen] Other (See Comments)   "Did not feel right"   Norvasc [amlodipine] Swelling   Ankle swelling   Sulfa Antibiotics Itching   Ultram [tramadol] Nausea Only   Pt OK to take half tablets ('25mg'$  doses)   Celebrex [celecoxib] Hives, Itching, Rash   Glucosamine Itching, Rash   Lipitor [atorvastatin] Rash, Other (See Comments)   Foot pain   Statins Rash   Vibramycin [doxycycline] Rash   Same reaction to Monohydrate and Hyclate  Medication List     TAKE these medications    acetaminophen 325 MG tablet Commonly known as: TYLENOL Take 2 tablets (650 mg total) by mouth every 4 (four) hours as needed for mild  pain, moderate pain, fever or headache. What changed: reasons to take this   albuterol 108 (90 Base) MCG/ACT inhaler Commonly known as: VENTOLIN HFA INHALE 2 PUFFS INTO THE LUNGS EVERY 6 HOURS AS NEEDED FOR WHEEZING OR SHORTNESS OF BREATH   ALPRAZolam 0.5 MG tablet Commonly known as: XANAX Take 1 tablet (0.5 mg total) by mouth 2 (two) times daily as needed for anxiety.   aspirin EC 81 MG tablet Take 81 mg by mouth daily.   azithromycin 500 MG tablet Commonly known as: ZITHROMAX Take 1 tablet (500 mg total) by mouth daily for 3 days.   budesonide 0.25 MG/2ML nebulizer solution Commonly known as: PULMICORT USE 1 VIAL  IN  NEBULIZER TWICE  DAILY - rinse mouth after treatment What changed: See the new instructions.   carboxymethylcellulose 0.5 % Soln Commonly known as: REFRESH PLUS Place 1 drop into both eyes daily as needed (dry eyes).   cefdinir 300 MG capsule Commonly known as: OMNICEF Take 1 capsule (300 mg total) by mouth 2 (two) times daily for 3 days.   cholecalciferol 1000 units tablet Commonly known as: VITAMIN D Take 1,000 Units by mouth daily.   esomeprazole 40 MG capsule Commonly known as: NEXIUM Take 40 mg by mouth daily.   fluticasone 50 MCG/ACT nasal spray Commonly known as: FLONASE Place 1 spray into both nostrils daily.   glipiZIDE 5 MG tablet Commonly known as: GLUCOTROL Take 5 mg by mouth every morning.   guaiFENesin 600 MG 12 hr tablet Commonly known as: MUCINEX Take 600 mg by mouth every 12 (twelve) hours.   hydrALAZINE 50 MG tablet Commonly known as: APRESOLINE Take 50 mg by mouth 3 (three) times daily.   ipratropium 0.02 % nebulizer solution Commonly known as: ATROVENT USE 1 VIAL IN NEBULIZER 4 TIMES DAILY What changed: See the new instructions.   irbesartan 300 MG tablet Commonly known as: AVAPRO TAKE 1 TABLET BY MOUTH EVERY DAY   metoprolol tartrate 50 MG tablet Commonly known as: LOPRESSOR Take 1 tablet (50 mg total) by mouth 2  (two) times daily.   NovoLIN R FlexPen ReliOn 100 UNIT/ML KwikPen Generic drug: Insulin Regular Human Inject 0-10 Units into the skin See admin instructions. Inject 0-10 units, check BS before meals and at bedtime - adjust per sliding scale : BS < 70 : call MD BS 71-150 : 0 units BS 151-200: 2 units BS 201-250: 4 units BS 251-300: 6 units BS 301-350: 8 units BS 351-400: 10 units BS > 400 : call MD   PRESERVISION AREDS 2 PO Take 1 capsule by mouth in the morning and at bedtime.   torsemide 20 MG tablet Commonly known as: DEMADEX Take 20 mg by mouth daily.   traMADol 50 MG tablet Commonly known as: ULTRAM Take 1 tablet (50 mg total) by mouth 2 (two) times daily as needed for moderate pain or severe pain.        Follow-up Information     [name], MD Follow up in 1 week(s).   Specialty: Family Medicine Why: Hospital follow up Contact information: [place] [id number] [phone]                Discharge Exam: Danley Danker Weights   02/20/22 0429  Weight: 56.7 kg  General exam: Awake, laying in bed, in nad Respiratory system: Normal respiratory effort, no wheezing Cardiovascular system: regular rate, s1, s2 Gastrointestinal system: Soft, nondistended, positive BS Central nervous system: CN2-12 grossly intact, strength intact Extremities: Perfused, no clubbing Skin: Normal skin turgor, no notable skin lesions seen Psychiatry: Mood normal // no visual hallucinations   Condition at discharge: fair  The results of significant diagnostics from this hospitalization (including imaging, microbiology, ancillary and laboratory) are listed below for reference.   Imaging Studies: DG Chest Port 1 View  Result Date: 02/20/2022 CLINICAL DATA:  Progressive shortness of breath. EXAM: PORTABLE CHEST 1 VIEW COMPARISON:  02/06/2022 FINDINGS: Stable cardiomediastinal contours. Chronic reticular and nodular interstitial opacities are identified  scattered throughout both lungs. This corresponds with previously demonstrated bronchiectasis, mucoid impaction, scarring and architectural distortion. There is new superimposed airspace disease identified within the left lower lobe concerning for pneumonia. Visualized osseous structures appear intact. IMPRESSION: 1. New superimposed airspace disease within the left lower lobe concerning for pneumonia. 2. Chronic reticular and nodular interstitial opacities scattered throughout both lungs compatible with chronic lung disease. Electronically Signed   By: Kerby Moors M.D.   On: 02/20/2022 05:34   DG Hand Complete Right  Result Date: 02/13/2022 CLINICAL DATA:  Thumb pain.  Hyperextension. EXAM: RIGHT HAND - COMPLETE 3+ VIEW COMPARISON:  Hand radiographs 05/08/2011. FINDINGS: The pulse oximeter was not removed from the long finger. The bones are diffusely demineralized. There is no evidence of acute fracture or dislocation. There are progressive advanced degenerative changes at the 1st carpometacarpal articulation with associated proximal subluxation of the 1st metacarpal. There is mild hyperextension at the 1st metacarpal phalangeal joint. Progressive joint space narrowing throughout the interphalangeal joints with new ulnar subluxation at the 3rd proximal interphalangeal joint. IMPRESSION: 1. No evidence of acute fracture or dislocation. 2. Progressive degenerative changes as described, most advanced at the 1st [facility] joint. Mild hyperextension at the 1st MCP joint is within physiologic limits. Correlate clinically. Electronically Signed   By: Richardean Sale M.D.   On: 02/13/2022 18:34   DG Chest Port 1 View  Result Date: 02/06/2022 CLINICAL DATA:  Shortness of breath. EXAM: PORTABLE CHEST 1 VIEW COMPARISON:  Radiograph 08/30/2021. CT 01/02/2021 FINDINGS: Stable heart size and mediastinal contours. Aortic atherosclerosis. Chronic left lung volume loss. Chronic right upper lobe and right mid lung opacity  corresponds to bronchiectasis and mucoid impaction on prior CT. The chronic left basilar opacities are better appreciated on prior CT with only limited radiographic correlate. No acute airspace disease. No pleural effusion. Chronic left shoulder arthropathy. IMPRESSION: 1. No acute findings. 2. Chronic right upper lobe and right mid lung opacity corresponds to bronchiectasis and mucoid impaction on prior CT. Electronically Signed   By: Keith Rake M.D.   On: 02/06/2022 20:02    Microbiology: Results for orders placed or performed during the hospital encounter of 02/20/22  Blood Culture (routine x 2)     Status: None (Preliminary result)   Collection Time: 02/20/22  5:13 AM   Specimen: BLOOD  Result Value Ref Range Status   Specimen Description   Final    BLOOD BLOOD RIGHT FOREARM Performed at [facility] [address], [place], [place] [address]    Special Requests   Final    BOTTLES DRAWN AEROBIC AND ANAEROBIC Blood Culture results may not be optimal due to an inadequate volume of blood received in culture bottles Performed at [facility] [address] [place], [place] [address]  Culture   Final    NO GROWTH 3 DAYS Performed at [facility], [place] [address], [place], [place] [address]    Report Status PENDING  Incomplete  Resp Panel by RT-PCR (Flu A&B, Covid) Anterior Nasal Swab     Status: None   Collection Time: 02/20/22  5:50 AM   Specimen: Anterior Nasal Swab  Result Value Ref Range Status   SARS Coronavirus 2 by RT PCR NEGATIVE NEGATIVE Final    Comment: (NOTE) SARS-CoV-2 target nucleic acids are NOT DETECTED.  The SARS-CoV-2 RNA is generally detectable in upper respiratory specimens during the acute phase of infection. The lowest concentration of SARS-CoV-2 viral copies this assay can detect is 138 copies/mL. A negative result does not preclude SARS-Cov-2 infection and should not be used as the sole basis for treatment  or other patient management decisions. A negative result may occur with  improper specimen collection/handling, submission of specimen other than nasopharyngeal swab, presence of viral mutation(s) within the areas targeted by this assay, and inadequate number of viral copies(<138 copies/mL). A negative result must be combined with clinical observations, patient history, and epidemiological information. The expected result is Negative.  Fact Sheet for Patients:  EntrepreneurPulse.com.au  Fact Sheet for Healthcare Providers:  IncredibleEmployment.be  This test is no t yet approved or cleared by the [place] FDA and  has been authorized for detection and/or diagnosis of SARS-CoV-2 by FDA under an Emergency Use Authorization (EUA). This EUA will remain  in effect (meaning this test can be used) for the duration of the COVID-19 declaration under Section 564(b)(1) of the Act, 21 U.S.C.section 360bbb-3(b)(1), unless the authorization is terminated  or revoked sooner.       Influenza A by PCR NEGATIVE NEGATIVE Final   Influenza B by PCR NEGATIVE NEGATIVE Final    Comment: (NOTE) The Xpert Xpress SARS-CoV-2/FLU/RSV plus assay is intended as an aid in the diagnosis of influenza from Nasopharyngeal swab specimens and should not be used as a sole basis for treatment. Nasal washings and aspirates are unacceptable for Xpert Xpress SARS-CoV-2/FLU/RSV testing.  Fact Sheet for Patients: EntrepreneurPulse.com.au  Fact Sheet for Healthcare Providers: IncredibleEmployment.be  This test is not yet approved or cleared by the [place] FDA and has been authorized for detection and/or diagnosis of SARS-CoV-2 by FDA under an Emergency Use Authorization (EUA). This EUA will remain in effect (meaning this test can be used) for the duration of the COVID-19 declaration under Section 564(b)(1) of the Act, 21 U.S.C. section  360bbb-3(b)(1), unless the authorization is terminated or revoked.  Performed at [facility], [place] [address], [place], [place] [address]   Blood Culture (routine x 2)     Status: None (Preliminary result)   Collection Time: 02/20/22  6:10 AM   Specimen: BLOOD  Result Value Ref Range Status   Specimen Description   Final    BLOOD SITE NOT SPECIFIED Performed at [facility] [address] [place], [place] [address]    Special Requests   Final    BOTTLES DRAWN AEROBIC ONLY Blood Culture results may not be optimal due to an inadequate volume of blood received in culture bottles Performed at [facility] [address] [place], [place] [address]    Culture   Final    NO GROWTH 3 DAYS Performed at [facility], [place] [address], [place], [place] [address]    Report Status PENDING  Incomplete  Urine Culture  Status: None   Collection Time: 02/20/22 11:55 AM   Specimen: In/Out Cath Urine  Result Value Ref Range Status   Specimen Description   Final    IN/OUT CATH URINE Performed at [facility], [place] [address] [place], [place] [address]    Special Requests   Final    NONE Performed at [facility], [place] [address] [place], [place] [address]    Culture   Final    NO GROWTH Performed at [facility], [place] [address], [place], [place] [address]    Report Status 02/21/2022 FINAL  Final  MRSA Next Gen by PCR, Nasal     Status: None   Collection Time: 02/21/22 12:30 PM   Specimen: Nasal Mucosa; Nasal Swab  Result Value Ref Range Status   MRSA by PCR Next Gen NOT DETECTED NOT DETECTED Final    Comment: (NOTE) The GeneXpert MRSA Assay (FDA approved for NASAL specimens only), is one component of a comprehensive MRSA colonization surveillance program. It is not intended to diagnose MRSA infection nor to guide or monitor treatment for MRSA infections. Test performance is  not FDA approved in patients less than 35 years old. Performed at [facility] [place] [address], [place], [place] [address]     Labs: CBC: Recent Labs  Lab 02/20/22 0513 02/21/22 0414 02/22/22 0416 02/23/22 0451  WBC 8.8 6.5 7.3 6.6  NEUTROABS 5.6  --   --   --   HGB 11.0* 9.9* 9.3* 9.2*  HCT 34.4* 31.4* 29.3* 29.2*  MCV 90.1 90.5 91.8 91.5  PLT 251 211 212 259   Basic Metabolic Panel: Recent Labs  Lab 02/20/22 0503 02/20/22 0513 02/21/22 0414 02/22/22 0416 02/23/22 0451  NA  --  135 135 133* 136  K  --  3.9 3.6 3.3* 4.2  CL  --  96* 100 99 107  CO2  --  '28 27 25 22  '$ GLUCOSE  --  120* 137* 94 107*  BUN  --  37* 24* 13 16  CREATININE  --  1.36* 0.95 0.79 0.79  CALCIUM  --  9.0 8.6* 8.2* 8.5*  MG 2.0  --   --   --   --   PHOS 3.2  --   --   --   --    Liver Function Tests: Recent Labs  Lab 02/20/22 0513 02/21/22 0414 02/22/22 0416 02/23/22 0451  AST 15 10* 10* 12*  ALT '11 9 8 12  '$ ALKPHOS 51 48 45 55  BILITOT 0.9 0.7 0.7 0.4  PROT 6.2* 5.4* 5.0* 5.3*  ALBUMIN 3.2* 2.6* 2.3* 2.5*   CBG: Recent Labs  Lab 02/22/22 0734 02/22/22 1144 02/22/22 1636 02/22/22 2136 02/23/22 1203  GLUCAP 100* 99 159* 133* 103*    Discharge time spent: less than 30 minutes.  Signed: Marylu Lund, MD [facility] 02/23/2022

## 2022-02-25 LAB — CULTURE, BLOOD (ROUTINE X 2)
Culture: NO GROWTH
Culture: NO GROWTH

## 2022-02-26 DIAGNOSIS — E441 Mild protein-calorie malnutrition: Secondary | ICD-10-CM | POA: Diagnosis not present

## 2022-02-26 DIAGNOSIS — I5032 Chronic diastolic (congestive) heart failure: Secondary | ICD-10-CM | POA: Diagnosis not present

## 2022-02-26 DIAGNOSIS — H548 Legal blindness, as defined in USA: Secondary | ICD-10-CM | POA: Diagnosis not present

## 2022-02-26 DIAGNOSIS — M109 Gout, unspecified: Secondary | ICD-10-CM | POA: Diagnosis not present

## 2022-02-26 DIAGNOSIS — I130 Hypertensive heart and chronic kidney disease with heart failure and stage 1 through stage 4 chronic kidney disease, or unspecified chronic kidney disease: Secondary | ICD-10-CM | POA: Diagnosis not present

## 2022-02-26 DIAGNOSIS — Z7982 Long term (current) use of aspirin: Secondary | ICD-10-CM | POA: Diagnosis not present

## 2022-02-26 DIAGNOSIS — Z9181 History of falling: Secondary | ICD-10-CM | POA: Diagnosis not present

## 2022-02-26 DIAGNOSIS — Z7984 Long term (current) use of oral hypoglycemic drugs: Secondary | ICD-10-CM | POA: Diagnosis not present

## 2022-02-26 DIAGNOSIS — J470 Bronchiectasis with acute lower respiratory infection: Secondary | ICD-10-CM | POA: Diagnosis not present

## 2022-02-26 DIAGNOSIS — Z794 Long term (current) use of insulin: Secondary | ICD-10-CM | POA: Diagnosis not present

## 2022-02-26 DIAGNOSIS — E1122 Type 2 diabetes mellitus with diabetic chronic kidney disease: Secondary | ICD-10-CM | POA: Diagnosis not present

## 2022-02-26 DIAGNOSIS — Z8616 Personal history of COVID-19: Secondary | ICD-10-CM | POA: Diagnosis not present

## 2022-02-26 DIAGNOSIS — N1831 Chronic kidney disease, stage 3a: Secondary | ICD-10-CM | POA: Diagnosis not present

## 2022-02-26 DIAGNOSIS — I701 Atherosclerosis of renal artery: Secondary | ICD-10-CM | POA: Diagnosis not present

## 2022-02-26 DIAGNOSIS — D509 Iron deficiency anemia, unspecified: Secondary | ICD-10-CM | POA: Diagnosis not present

## 2022-02-26 DIAGNOSIS — J471 Bronchiectasis with (acute) exacerbation: Secondary | ICD-10-CM | POA: Diagnosis not present

## 2022-02-26 DIAGNOSIS — Z7951 Long term (current) use of inhaled steroids: Secondary | ICD-10-CM | POA: Diagnosis not present

## 2022-02-26 DIAGNOSIS — J441 Chronic obstructive pulmonary disease with (acute) exacerbation: Secondary | ICD-10-CM | POA: Diagnosis not present

## 2022-02-26 DIAGNOSIS — J189 Pneumonia, unspecified organism: Secondary | ICD-10-CM | POA: Diagnosis not present

## 2022-02-26 DIAGNOSIS — F3289 Other specified depressive episodes: Secondary | ICD-10-CM | POA: Diagnosis not present

## 2022-02-26 DIAGNOSIS — F418 Other specified anxiety disorders: Secondary | ICD-10-CM | POA: Diagnosis not present

## 2022-02-26 DIAGNOSIS — J440 Chronic obstructive pulmonary disease with acute lower respiratory infection: Secondary | ICD-10-CM | POA: Diagnosis not present

## 2022-02-28 DIAGNOSIS — M179 Osteoarthritis of knee, unspecified: Secondary | ICD-10-CM | POA: Diagnosis not present

## 2022-02-28 DIAGNOSIS — J189 Pneumonia, unspecified organism: Secondary | ICD-10-CM | POA: Diagnosis not present

## 2022-02-28 DIAGNOSIS — L989 Disorder of the skin and subcutaneous tissue, unspecified: Secondary | ICD-10-CM | POA: Diagnosis not present

## 2022-02-28 DIAGNOSIS — R531 Weakness: Secondary | ICD-10-CM | POA: Diagnosis not present

## 2022-02-28 DIAGNOSIS — Z09 Encounter for follow-up examination after completed treatment for conditions other than malignant neoplasm: Secondary | ICD-10-CM | POA: Diagnosis not present

## 2022-03-01 DIAGNOSIS — Z7982 Long term (current) use of aspirin: Secondary | ICD-10-CM | POA: Diagnosis not present

## 2022-03-01 DIAGNOSIS — Z9981 Dependence on supplemental oxygen: Secondary | ICD-10-CM | POA: Diagnosis not present

## 2022-03-01 DIAGNOSIS — D509 Iron deficiency anemia, unspecified: Secondary | ICD-10-CM | POA: Diagnosis not present

## 2022-03-01 DIAGNOSIS — Z7951 Long term (current) use of inhaled steroids: Secondary | ICD-10-CM | POA: Diagnosis not present

## 2022-03-01 DIAGNOSIS — G4700 Insomnia, unspecified: Secondary | ICD-10-CM | POA: Diagnosis not present

## 2022-03-01 DIAGNOSIS — J440 Chronic obstructive pulmonary disease with acute lower respiratory infection: Secondary | ICD-10-CM | POA: Diagnosis not present

## 2022-03-01 DIAGNOSIS — Z79891 Long term (current) use of opiate analgesic: Secondary | ICD-10-CM | POA: Diagnosis not present

## 2022-03-01 DIAGNOSIS — J471 Bronchiectasis with (acute) exacerbation: Secondary | ICD-10-CM | POA: Diagnosis not present

## 2022-03-01 DIAGNOSIS — E1140 Type 2 diabetes mellitus with diabetic neuropathy, unspecified: Secondary | ICD-10-CM | POA: Diagnosis not present

## 2022-03-01 DIAGNOSIS — M179 Osteoarthritis of knee, unspecified: Secondary | ICD-10-CM | POA: Diagnosis not present

## 2022-03-01 DIAGNOSIS — N1831 Chronic kidney disease, stage 3a: Secondary | ICD-10-CM | POA: Diagnosis not present

## 2022-03-01 DIAGNOSIS — I5032 Chronic diastolic (congestive) heart failure: Secondary | ICD-10-CM | POA: Diagnosis not present

## 2022-03-01 DIAGNOSIS — E1122 Type 2 diabetes mellitus with diabetic chronic kidney disease: Secondary | ICD-10-CM | POA: Diagnosis not present

## 2022-03-01 DIAGNOSIS — E441 Mild protein-calorie malnutrition: Secondary | ICD-10-CM | POA: Diagnosis not present

## 2022-03-01 DIAGNOSIS — K219 Gastro-esophageal reflux disease without esophagitis: Secondary | ICD-10-CM | POA: Diagnosis not present

## 2022-03-01 DIAGNOSIS — J441 Chronic obstructive pulmonary disease with (acute) exacerbation: Secondary | ICD-10-CM | POA: Diagnosis not present

## 2022-03-01 DIAGNOSIS — I701 Atherosclerosis of renal artery: Secondary | ICD-10-CM | POA: Diagnosis not present

## 2022-03-01 DIAGNOSIS — E785 Hyperlipidemia, unspecified: Secondary | ICD-10-CM | POA: Diagnosis not present

## 2022-03-01 DIAGNOSIS — J189 Pneumonia, unspecified organism: Secondary | ICD-10-CM | POA: Diagnosis not present

## 2022-03-01 DIAGNOSIS — J470 Bronchiectasis with acute lower respiratory infection: Secondary | ICD-10-CM | POA: Diagnosis not present

## 2022-03-04 DIAGNOSIS — J470 Bronchiectasis with acute lower respiratory infection: Secondary | ICD-10-CM | POA: Diagnosis not present

## 2022-03-04 DIAGNOSIS — J471 Bronchiectasis with (acute) exacerbation: Secondary | ICD-10-CM | POA: Diagnosis not present

## 2022-03-04 DIAGNOSIS — J441 Chronic obstructive pulmonary disease with (acute) exacerbation: Secondary | ICD-10-CM | POA: Diagnosis not present

## 2022-03-04 DIAGNOSIS — E441 Mild protein-calorie malnutrition: Secondary | ICD-10-CM | POA: Diagnosis not present

## 2022-03-04 DIAGNOSIS — J189 Pneumonia, unspecified organism: Secondary | ICD-10-CM | POA: Diagnosis not present

## 2022-03-04 DIAGNOSIS — J440 Chronic obstructive pulmonary disease with acute lower respiratory infection: Secondary | ICD-10-CM | POA: Diagnosis not present

## 2022-03-05 DIAGNOSIS — J441 Chronic obstructive pulmonary disease with (acute) exacerbation: Secondary | ICD-10-CM | POA: Diagnosis not present

## 2022-03-05 DIAGNOSIS — J440 Chronic obstructive pulmonary disease with acute lower respiratory infection: Secondary | ICD-10-CM | POA: Diagnosis not present

## 2022-03-05 DIAGNOSIS — J189 Pneumonia, unspecified organism: Secondary | ICD-10-CM | POA: Diagnosis not present

## 2022-03-05 DIAGNOSIS — E441 Mild protein-calorie malnutrition: Secondary | ICD-10-CM | POA: Diagnosis not present

## 2022-03-05 DIAGNOSIS — J471 Bronchiectasis with (acute) exacerbation: Secondary | ICD-10-CM | POA: Diagnosis not present

## 2022-03-05 DIAGNOSIS — J470 Bronchiectasis with acute lower respiratory infection: Secondary | ICD-10-CM | POA: Diagnosis not present

## 2022-03-06 ENCOUNTER — Other Ambulatory Visit: Payer: Self-pay | Admitting: Internal Medicine

## 2022-03-06 DIAGNOSIS — J441 Chronic obstructive pulmonary disease with (acute) exacerbation: Secondary | ICD-10-CM | POA: Diagnosis not present

## 2022-03-06 DIAGNOSIS — J471 Bronchiectasis with (acute) exacerbation: Secondary | ICD-10-CM | POA: Diagnosis not present

## 2022-03-06 DIAGNOSIS — J470 Bronchiectasis with acute lower respiratory infection: Secondary | ICD-10-CM | POA: Diagnosis not present

## 2022-03-06 DIAGNOSIS — J440 Chronic obstructive pulmonary disease with acute lower respiratory infection: Secondary | ICD-10-CM | POA: Diagnosis not present

## 2022-03-06 DIAGNOSIS — E441 Mild protein-calorie malnutrition: Secondary | ICD-10-CM | POA: Diagnosis not present

## 2022-03-06 DIAGNOSIS — J189 Pneumonia, unspecified organism: Secondary | ICD-10-CM | POA: Diagnosis not present

## 2022-03-06 NOTE — Telephone Encounter (Signed)
Please advise on refill request.   Allergies  Allergen Reactions   Welchol [Colesevelam Hcl] Hives   Coreg [Carvedilol] Other (See Comments)    Dizziness    Micardis [Telmisartan] Itching   Mobic [Meloxicam] Other (See Comments)    Unknown reaction   Norco [Hydrocodone-Acetaminophen] Other (See Comments)    "Did not feel right"   Norvasc [Amlodipine] Swelling    Ankle swelling   Sulfa Antibiotics Itching   Ultram [Tramadol] Nausea Only    Pt OK to take half tablets ('25mg'$  doses)   Celebrex [Celecoxib] Hives, Itching and Rash   Glucosamine Itching and Rash   Lipitor [Atorvastatin] Rash and Other (See Comments)    Foot pain   Statins Rash   Vibramycin [Doxycycline] Rash    Same reaction to Monohydrate and Hyclate     Current Outpatient Medications:    acetaminophen (TYLENOL) 325 MG tablet, Take 2 tablets (650 mg total) by mouth every 4 (four) hours as needed for mild pain, moderate pain, fever or headache. (Patient taking differently: Take 650 mg by mouth every 4 (four) hours as needed for mild pain, moderate pain, fever or headache (chills).), Disp: , Rfl:    albuterol (VENTOLIN HFA) 108 (90 Base) MCG/ACT inhaler, INHALE 2 PUFFS INTO THE LUNGS EVERY 6 HOURS AS NEEDED FOR WHEEZING OR SHORTNESS OF BREATH (Patient taking differently: Inhale 2 puffs into the lungs every 6 (six) hours as needed for wheezing or shortness of breath.), Disp: 8.5 each, Rfl: 3   ALPRAZolam (XANAX) 0.5 MG tablet, Take 1 tablet (0.5 mg total) by mouth 2 (two) times daily as needed for anxiety., Disp: 10 tablet, Rfl: 0   aspirin EC 81 MG tablet, Take 81 mg by mouth daily., Disp: , Rfl:    budesonide (PULMICORT) 0.25 MG/2ML nebulizer solution, USE 1 VIAL  IN  NEBULIZER TWICE  DAILY - rinse mouth after treatment (Patient taking differently: Take 0.25 mg by nebulization 2 (two) times daily.), Disp: 60 mL, Rfl: 11   carboxymethylcellulose (REFRESH PLUS) 0.5 % SOLN, Place 1 drop into both eyes daily as needed (dry  eyes)., Disp: , Rfl:    cholecalciferol (VITAMIN D) 1000 UNITS tablet, Take 1,000 Units by mouth daily., Disp: , Rfl:    esomeprazole (NEXIUM) 40 MG capsule, Take 40 mg by mouth daily., Disp: , Rfl:    fluticasone (FLONASE) 50 MCG/ACT nasal spray, Place 1 spray into both nostrils daily., Disp: , Rfl:    glipiZIDE (GLUCOTROL) 5 MG tablet, Take 5 mg by mouth every morning., Disp: , Rfl:    guaiFENesin (MUCINEX) 600 MG 12 hr tablet, Take 600 mg by mouth every 12 (twelve) hours., Disp: , Rfl:    hydrALAZINE (APRESOLINE) 50 MG tablet, Take 50 mg by mouth 3 (three) times daily., Disp: , Rfl:    Insulin Regular Human (NOVOLIN R FLEXPEN RELION) 100 UNIT/ML KwikPen, Inject 0-10 Units into the skin See admin instructions. Inject 0-10 units, check BS before meals and at bedtime - adjust per sliding scale : BS < 70 : call MD BS 71-150 : 0 units BS 151-200: 2 units BS 201-250: 4 units BS 251-300: 6 units BS 301-350: 8 units BS 351-400: 10 units BS > 400 : call MD, Disp: , Rfl:    ipratropium (ATROVENT) 0.02 % nebulizer solution, USE 1 VIAL IN NEBULIZER 4 TIMES DAILY (Patient taking differently: Take 0.5 mg by nebulization 4 (four) times daily.), Disp: 360 mL, Rfl: 11   irbesartan (AVAPRO) 300 MG tablet, TAKE 1 TABLET BY  MOUTH EVERY DAY (Patient taking differently: Take 300 mg by mouth daily.), Disp: 90 tablet, Rfl: 1   metoprolol tartrate (LOPRESSOR) 50 MG tablet, Take 1 tablet (50 mg total) by mouth 2 (two) times daily., Disp: 180 tablet, Rfl: 1   Multiple Vitamins-Minerals (PRESERVISION AREDS 2 PO), Take 1 capsule by mouth in the morning and at bedtime., Disp: , Rfl:    torsemide (DEMADEX) 20 MG tablet, Take 20 mg by mouth daily., Disp: , Rfl:    traMADol (ULTRAM) 50 MG tablet, Take 1 tablet (50 mg total) by mouth 2 (two) times daily as needed for moderate pain or severe pain., Disp: 10 tablet, Rfl: 0

## 2022-03-07 NOTE — Telephone Encounter (Signed)
Tylenol # 2 tabs for cough refilled

## 2022-03-08 DIAGNOSIS — E441 Mild protein-calorie malnutrition: Secondary | ICD-10-CM | POA: Diagnosis not present

## 2022-03-08 DIAGNOSIS — J440 Chronic obstructive pulmonary disease with acute lower respiratory infection: Secondary | ICD-10-CM | POA: Diagnosis not present

## 2022-03-08 DIAGNOSIS — J470 Bronchiectasis with acute lower respiratory infection: Secondary | ICD-10-CM | POA: Diagnosis not present

## 2022-03-08 DIAGNOSIS — J189 Pneumonia, unspecified organism: Secondary | ICD-10-CM | POA: Diagnosis not present

## 2022-03-08 DIAGNOSIS — J441 Chronic obstructive pulmonary disease with (acute) exacerbation: Secondary | ICD-10-CM | POA: Diagnosis not present

## 2022-03-08 DIAGNOSIS — J471 Bronchiectasis with (acute) exacerbation: Secondary | ICD-10-CM | POA: Diagnosis not present

## 2022-03-13 DIAGNOSIS — J189 Pneumonia, unspecified organism: Secondary | ICD-10-CM | POA: Diagnosis not present

## 2022-03-13 DIAGNOSIS — R531 Weakness: Secondary | ICD-10-CM | POA: Diagnosis not present

## 2022-03-13 DIAGNOSIS — R0609 Other forms of dyspnea: Secondary | ICD-10-CM | POA: Diagnosis not present

## 2022-03-18 DIAGNOSIS — J189 Pneumonia, unspecified organism: Secondary | ICD-10-CM | POA: Diagnosis not present

## 2022-03-18 DIAGNOSIS — J470 Bronchiectasis with acute lower respiratory infection: Secondary | ICD-10-CM | POA: Diagnosis not present

## 2022-03-18 DIAGNOSIS — J471 Bronchiectasis with (acute) exacerbation: Secondary | ICD-10-CM | POA: Diagnosis not present

## 2022-03-18 DIAGNOSIS — J440 Chronic obstructive pulmonary disease with acute lower respiratory infection: Secondary | ICD-10-CM | POA: Diagnosis not present

## 2022-03-18 DIAGNOSIS — E441 Mild protein-calorie malnutrition: Secondary | ICD-10-CM | POA: Diagnosis not present

## 2022-03-18 DIAGNOSIS — J441 Chronic obstructive pulmonary disease with (acute) exacerbation: Secondary | ICD-10-CM | POA: Diagnosis not present

## 2022-03-19 DIAGNOSIS — J440 Chronic obstructive pulmonary disease with acute lower respiratory infection: Secondary | ICD-10-CM | POA: Diagnosis not present

## 2022-03-19 DIAGNOSIS — J441 Chronic obstructive pulmonary disease with (acute) exacerbation: Secondary | ICD-10-CM | POA: Diagnosis not present

## 2022-03-19 DIAGNOSIS — E441 Mild protein-calorie malnutrition: Secondary | ICD-10-CM | POA: Diagnosis not present

## 2022-03-19 DIAGNOSIS — J470 Bronchiectasis with acute lower respiratory infection: Secondary | ICD-10-CM | POA: Diagnosis not present

## 2022-03-19 DIAGNOSIS — J471 Bronchiectasis with (acute) exacerbation: Secondary | ICD-10-CM | POA: Diagnosis not present

## 2022-03-19 DIAGNOSIS — J189 Pneumonia, unspecified organism: Secondary | ICD-10-CM | POA: Diagnosis not present

## 2022-03-25 DIAGNOSIS — J441 Chronic obstructive pulmonary disease with (acute) exacerbation: Secondary | ICD-10-CM | POA: Diagnosis not present

## 2022-03-25 DIAGNOSIS — E441 Mild protein-calorie malnutrition: Secondary | ICD-10-CM | POA: Diagnosis not present

## 2022-03-25 DIAGNOSIS — J471 Bronchiectasis with (acute) exacerbation: Secondary | ICD-10-CM | POA: Diagnosis not present

## 2022-03-25 DIAGNOSIS — J470 Bronchiectasis with acute lower respiratory infection: Secondary | ICD-10-CM | POA: Diagnosis not present

## 2022-03-25 DIAGNOSIS — J189 Pneumonia, unspecified organism: Secondary | ICD-10-CM | POA: Diagnosis not present

## 2022-03-25 DIAGNOSIS — J440 Chronic obstructive pulmonary disease with acute lower respiratory infection: Secondary | ICD-10-CM | POA: Diagnosis not present

## 2022-03-27 DIAGNOSIS — J441 Chronic obstructive pulmonary disease with (acute) exacerbation: Secondary | ICD-10-CM | POA: Diagnosis not present

## 2022-03-27 DIAGNOSIS — J471 Bronchiectasis with (acute) exacerbation: Secondary | ICD-10-CM | POA: Diagnosis not present

## 2022-03-27 DIAGNOSIS — E441 Mild protein-calorie malnutrition: Secondary | ICD-10-CM | POA: Diagnosis not present

## 2022-03-27 DIAGNOSIS — J470 Bronchiectasis with acute lower respiratory infection: Secondary | ICD-10-CM | POA: Diagnosis not present

## 2022-03-27 DIAGNOSIS — J440 Chronic obstructive pulmonary disease with acute lower respiratory infection: Secondary | ICD-10-CM | POA: Diagnosis not present

## 2022-03-27 DIAGNOSIS — J189 Pneumonia, unspecified organism: Secondary | ICD-10-CM | POA: Diagnosis not present

## 2022-03-31 DIAGNOSIS — K219 Gastro-esophageal reflux disease without esophagitis: Secondary | ICD-10-CM | POA: Diagnosis not present

## 2022-03-31 DIAGNOSIS — E441 Mild protein-calorie malnutrition: Secondary | ICD-10-CM | POA: Diagnosis not present

## 2022-03-31 DIAGNOSIS — E1140 Type 2 diabetes mellitus with diabetic neuropathy, unspecified: Secondary | ICD-10-CM | POA: Diagnosis not present

## 2022-03-31 DIAGNOSIS — J441 Chronic obstructive pulmonary disease with (acute) exacerbation: Secondary | ICD-10-CM | POA: Diagnosis not present

## 2022-03-31 DIAGNOSIS — J440 Chronic obstructive pulmonary disease with acute lower respiratory infection: Secondary | ICD-10-CM | POA: Diagnosis not present

## 2022-03-31 DIAGNOSIS — E785 Hyperlipidemia, unspecified: Secondary | ICD-10-CM | POA: Diagnosis not present

## 2022-03-31 DIAGNOSIS — J470 Bronchiectasis with acute lower respiratory infection: Secondary | ICD-10-CM | POA: Diagnosis not present

## 2022-03-31 DIAGNOSIS — I701 Atherosclerosis of renal artery: Secondary | ICD-10-CM | POA: Diagnosis not present

## 2022-03-31 DIAGNOSIS — Z7982 Long term (current) use of aspirin: Secondary | ICD-10-CM | POA: Diagnosis not present

## 2022-03-31 DIAGNOSIS — D509 Iron deficiency anemia, unspecified: Secondary | ICD-10-CM | POA: Diagnosis not present

## 2022-03-31 DIAGNOSIS — I5032 Chronic diastolic (congestive) heart failure: Secondary | ICD-10-CM | POA: Diagnosis not present

## 2022-03-31 DIAGNOSIS — Z7951 Long term (current) use of inhaled steroids: Secondary | ICD-10-CM | POA: Diagnosis not present

## 2022-03-31 DIAGNOSIS — N1831 Chronic kidney disease, stage 3a: Secondary | ICD-10-CM | POA: Diagnosis not present

## 2022-03-31 DIAGNOSIS — J189 Pneumonia, unspecified organism: Secondary | ICD-10-CM | POA: Diagnosis not present

## 2022-03-31 DIAGNOSIS — Z79891 Long term (current) use of opiate analgesic: Secondary | ICD-10-CM | POA: Diagnosis not present

## 2022-03-31 DIAGNOSIS — G4700 Insomnia, unspecified: Secondary | ICD-10-CM | POA: Diagnosis not present

## 2022-03-31 DIAGNOSIS — M179 Osteoarthritis of knee, unspecified: Secondary | ICD-10-CM | POA: Diagnosis not present

## 2022-03-31 DIAGNOSIS — J471 Bronchiectasis with (acute) exacerbation: Secondary | ICD-10-CM | POA: Diagnosis not present

## 2022-03-31 DIAGNOSIS — Z9981 Dependence on supplemental oxygen: Secondary | ICD-10-CM | POA: Diagnosis not present

## 2022-03-31 DIAGNOSIS — E1122 Type 2 diabetes mellitus with diabetic chronic kidney disease: Secondary | ICD-10-CM | POA: Diagnosis not present

## 2022-04-01 DIAGNOSIS — E1149 Type 2 diabetes mellitus with other diabetic neurological complication: Secondary | ICD-10-CM | POA: Diagnosis not present

## 2022-04-01 DIAGNOSIS — J189 Pneumonia, unspecified organism: Secondary | ICD-10-CM | POA: Diagnosis not present

## 2022-04-01 DIAGNOSIS — F411 Generalized anxiety disorder: Secondary | ICD-10-CM | POA: Diagnosis not present

## 2022-04-01 DIAGNOSIS — I10 Essential (primary) hypertension: Secondary | ICD-10-CM | POA: Diagnosis not present

## 2022-04-01 DIAGNOSIS — F430 Acute stress reaction: Secondary | ICD-10-CM | POA: Diagnosis not present

## 2022-04-01 DIAGNOSIS — Z09 Encounter for follow-up examination after completed treatment for conditions other than malignant neoplasm: Secondary | ICD-10-CM | POA: Diagnosis not present

## 2022-04-03 ENCOUNTER — Telehealth: Payer: Self-pay | Admitting: Internal Medicine

## 2022-04-03 MED ORDER — BUDESONIDE 0.25 MG/2ML IN SUSP: 0.2500 mg | Freq: Every day | RESPIRATORY_TRACT | 11 refills | Status: DC

## 2022-04-03 NOTE — Telephone Encounter (Signed)
Jamesetta So the caregiver back and told him Dr Annamaria Boots states she can try breztri. I told him we are out of samples right now but for him to call back next week to see if we have any samples for her to try. Nothing further needed

## 2022-04-03 NOTE — Telephone Encounter (Signed)
Called caregiver back and went over patient needing refill son budesonide nebulizer refills. Sent n refills to [facility] as requested. But while on the phone he was asking if Dr Annamaria Boots thinks patient would benefit from breztri. I advised him that I would message Dr Annamaria Boots to get his input.   Please advise sir

## 2022-04-03 NOTE — Telephone Encounter (Signed)
It would be ok to set a sample of Breztri upfront for them to pick up and try.  Inhale 2 puffs then rinse mouth, twice daily. See if it helps her breathing.

## 2022-04-04 ENCOUNTER — Telehealth: Payer: Self-pay | Admitting: Internal Medicine

## 2022-04-04 DIAGNOSIS — J471 Bronchiectasis with (acute) exacerbation: Secondary | ICD-10-CM | POA: Diagnosis not present

## 2022-04-04 DIAGNOSIS — J440 Chronic obstructive pulmonary disease with acute lower respiratory infection: Secondary | ICD-10-CM | POA: Diagnosis not present

## 2022-04-04 DIAGNOSIS — J470 Bronchiectasis with acute lower respiratory infection: Secondary | ICD-10-CM | POA: Diagnosis not present

## 2022-04-04 DIAGNOSIS — J441 Chronic obstructive pulmonary disease with (acute) exacerbation: Secondary | ICD-10-CM | POA: Diagnosis not present

## 2022-04-04 DIAGNOSIS — J189 Pneumonia, unspecified organism: Secondary | ICD-10-CM | POA: Diagnosis not present

## 2022-04-04 DIAGNOSIS — E441 Mild protein-calorie malnutrition: Secondary | ICD-10-CM | POA: Diagnosis not present

## 2022-04-04 NOTE — Telephone Encounter (Signed)
Printed off and faxed last office note from patient to [facility] with attn to [place]. Nothing further needed

## 2022-04-05 DIAGNOSIS — J471 Bronchiectasis with (acute) exacerbation: Secondary | ICD-10-CM | POA: Diagnosis not present

## 2022-04-05 DIAGNOSIS — J470 Bronchiectasis with acute lower respiratory infection: Secondary | ICD-10-CM | POA: Diagnosis not present

## 2022-04-05 DIAGNOSIS — J189 Pneumonia, unspecified organism: Secondary | ICD-10-CM | POA: Diagnosis not present

## 2022-04-05 DIAGNOSIS — J441 Chronic obstructive pulmonary disease with (acute) exacerbation: Secondary | ICD-10-CM | POA: Diagnosis not present

## 2022-04-05 DIAGNOSIS — E441 Mild protein-calorie malnutrition: Secondary | ICD-10-CM | POA: Diagnosis not present

## 2022-04-05 DIAGNOSIS — J440 Chronic obstructive pulmonary disease with acute lower respiratory infection: Secondary | ICD-10-CM | POA: Diagnosis not present

## 2022-04-09 ENCOUNTER — Telehealth: Payer: Self-pay | Admitting: Internal Medicine

## 2022-04-09 NOTE — Telephone Encounter (Signed)
Katie Alvarado phone number is [phone]. Fax number is [phone].   She said the RX they have has conflicting instructions for dosing (ie times per day is put in two different ways which negates the other.) and their Pharmacy is rejecting it. Pls refax or call for clarification. TY

## 2022-04-10 MED ORDER — BUDESONIDE 0.25 MG/2ML IN SUSP: 0.2500 mg | Freq: Two times a day (BID) | RESPIRATORY_TRACT | 11 refills | Status: DC

## 2022-04-10 NOTE — Telephone Encounter (Signed)
Called [facility] and spoke with [place]. Went over the Budesonide neb order and got it fixed and resent order. Nothing further needed

## 2022-04-19 ENCOUNTER — Other Ambulatory Visit: Payer: Self-pay

## 2022-04-19 MED ORDER — BUDESONIDE 0.25 MG/2ML IN SUSP: 0.2500 mg | Freq: Two times a day (BID) | RESPIRATORY_TRACT | 11 refills | Status: DC

## 2022-05-14 ENCOUNTER — Other Ambulatory Visit: Payer: Self-pay

## 2022-05-14 ENCOUNTER — Telehealth: Payer: Self-pay | Admitting: Internal Medicine

## 2022-05-14 MED ORDER — BREZTRI AEROSPHERE 160-9-4.8 MCG/ACT IN AERO: 2.0000 | INHALATION_SPRAY | Freq: Two times a day (BID) | RESPIRATORY_TRACT | 0 refills | Status: DC

## 2022-05-14 MED ORDER — BENZONATATE 200 MG PO CAPS: 200.0000 mg | ORAL_CAPSULE | Freq: Three times a day (TID) | ORAL | 1 refills | Status: DC | PRN

## 2022-05-14 NOTE — Telephone Encounter (Signed)
Benzonatate perles sent

## 2022-05-16 NOTE — Telephone Encounter (Signed)
Called Pt to let her know that her Rx for Tessalon Pearls were sent in to [facility]. Pt stated understanding and nothing further needed.

## 2022-05-17 DIAGNOSIS — R300 Dysuria: Secondary | ICD-10-CM | POA: Diagnosis not present

## 2022-05-29 DIAGNOSIS — L299 Pruritus, unspecified: Secondary | ICD-10-CM | POA: Diagnosis not present

## 2022-05-29 DIAGNOSIS — E1149 Type 2 diabetes mellitus with other diabetic neurological complication: Secondary | ICD-10-CM | POA: Diagnosis not present

## 2022-05-29 DIAGNOSIS — I10 Essential (primary) hypertension: Secondary | ICD-10-CM | POA: Diagnosis not present

## 2022-05-29 DIAGNOSIS — E785 Hyperlipidemia, unspecified: Secondary | ICD-10-CM | POA: Diagnosis not present

## 2022-05-29 DIAGNOSIS — M1811 Unilateral primary osteoarthritis of first carpometacarpal joint, right hand: Secondary | ICD-10-CM | POA: Diagnosis not present

## 2022-06-03 ENCOUNTER — Other Ambulatory Visit: Payer: Self-pay | Admitting: Internal Medicine

## 2022-06-19 DIAGNOSIS — I10 Essential (primary) hypertension: Secondary | ICD-10-CM | POA: Diagnosis not present

## 2022-06-19 DIAGNOSIS — E1149 Type 2 diabetes mellitus with other diabetic neurological complication: Secondary | ICD-10-CM | POA: Diagnosis not present

## 2022-06-19 DIAGNOSIS — E785 Hyperlipidemia, unspecified: Secondary | ICD-10-CM | POA: Diagnosis not present

## 2022-07-02 ENCOUNTER — Other Ambulatory Visit: Payer: Self-pay | Admitting: Internal Medicine

## 2022-07-03 NOTE — Telephone Encounter (Signed)
Please advise on refill request  Allergies  Allergen Reactions   Welchol [Colesevelam Hcl] Hives   Coreg [Carvedilol] Other (See Comments)    Dizziness    Micardis [Telmisartan] Itching   Mobic [Meloxicam] Other (See Comments)    Unknown reaction   Norco [Hydrocodone-Acetaminophen] Other (See Comments)    "Did not feel right"   Norvasc [Amlodipine] Swelling    Ankle swelling   Sulfa Antibiotics Itching   Ultram [Tramadol] Nausea Only    Pt OK to take half tablets ('25mg'$  doses)   Celebrex [Celecoxib] Hives, Itching and Rash   Glucosamine Itching and Rash   Lipitor [Atorvastatin] Rash and Other (See Comments)    Foot pain   Statins Rash   Vibramycin [Doxycycline] Rash    Same reaction to Monohydrate and Hyclate     Current Outpatient Medications:    acetaminophen (TYLENOL) 325 MG tablet, Take 2 tablets (650 mg total) by mouth every 4 (four) hours as needed for mild pain, moderate pain, fever or headache. (Patient taking differently: Take 650 mg by mouth every 4 (four) hours as needed for mild pain, moderate pain, fever or headache (chills).), Disp: , Rfl:    acetaminophen-codeine (TYLENOL #2) 300-15 MG tablet, TAKE 1 TABLET BY MOUTH EVERY 6 HOURS AS NEEDED FOR PAIN, Disp: 30 tablet, Rfl: 1   albuterol (VENTOLIN HFA) 108 (90 Base) MCG/ACT inhaler, INHALE 2 PUFFS INTO THE LUNGS EVERY 6 HOURS AS NEEDED FOR WHEEZING OR SHORTNESS OF BREATH, Disp: 8.5 each, Rfl: 0   ALPRAZolam (XANAX) 0.5 MG tablet, Take 1 tablet (0.5 mg total) by mouth 2 (two) times daily as needed for anxiety., Disp: 10 tablet, Rfl: 0   aspirin EC 81 MG tablet, Take 81 mg by mouth daily., Disp: , Rfl:    benzonatate (TESSALON) 200 MG capsule, Take 1 capsule (200 mg total) by mouth 3 (three) times daily as needed for cough., Disp: 30 capsule, Rfl: 1   Budeson-Glycopyrrol-Formoterol (BREZTRI AEROSPHERE) 160-9-4.8 MCG/ACT AERO, Inhale 2 puffs into the lungs in the morning and at bedtime., Disp: 5.9 g, Rfl: 0   budesonide  (PULMICORT) 0.25 MG/2ML nebulizer solution, Take 2 mLs (0.25 mg total) by nebulization 2 (two) times daily., Disp: 60 mL, Rfl: 11   carboxymethylcellulose (REFRESH PLUS) 0.5 % SOLN, Place 1 drop into both eyes daily as needed (dry eyes)., Disp: , Rfl:    cholecalciferol (VITAMIN D) 1000 UNITS tablet, Take 1,000 Units by mouth daily., Disp: , Rfl:    esomeprazole (NEXIUM) 40 MG capsule, Take 40 mg by mouth daily., Disp: , Rfl:    fluticasone (FLONASE) 50 MCG/ACT nasal spray, Place 1 spray into both nostrils daily., Disp: , Rfl:    glipiZIDE (GLUCOTROL) 5 MG tablet, Take 5 mg by mouth every morning., Disp: , Rfl:    guaiFENesin (MUCINEX) 600 MG 12 hr tablet, Take 600 mg by mouth every 12 (twelve) hours., Disp: , Rfl:    hydrALAZINE (APRESOLINE) 50 MG tablet, Take 50 mg by mouth 3 (three) times daily., Disp: , Rfl:    Insulin Regular Human (NOVOLIN R FLEXPEN RELION) 100 UNIT/ML KwikPen, Inject 0-10 Units into the skin See admin instructions. Inject 0-10 units, check BS before meals and at bedtime - adjust per sliding scale : BS < 70 : call MD BS 71-150 : 0 units BS 151-200: 2 units BS 201-250: 4 units BS 251-300: 6 units BS 301-350: 8 units BS 351-400: 10 units BS > 400 : call MD, Disp: , Rfl:    ipratropium (  ATROVENT) 0.02 % nebulizer solution, USE 1 VIAL IN NEBULIZER 4 TIMES DAILY (Patient taking differently: Take 0.5 mg by nebulization 4 (four) times daily.), Disp: 360 mL, Rfl: 11   irbesartan (AVAPRO) 300 MG tablet, TAKE 1 TABLET BY MOUTH EVERY DAY (Patient taking differently: Take 300 mg by mouth daily.), Disp: 90 tablet, Rfl: 1   metoprolol tartrate (LOPRESSOR) 50 MG tablet, Take 1 tablet (50 mg total) by mouth 2 (two) times daily., Disp: 180 tablet, Rfl: 1   Multiple Vitamins-Minerals (PRESERVISION AREDS 2 PO), Take 1 capsule by mouth in the morning and at bedtime., Disp: , Rfl:    torsemide (DEMADEX) 20 MG tablet, Take 20 mg by mouth daily., Disp: , Rfl:    traMADol (ULTRAM) 50 MG tablet, Take 1  tablet (50 mg total) by mouth 2 (two) times daily as needed for moderate pain or severe pain., Disp: 10 tablet, Rfl: 0

## 2022-07-04 NOTE — Telephone Encounter (Signed)
Benzonatate refilled 

## 2022-07-08 ENCOUNTER — Other Ambulatory Visit: Payer: Self-pay | Admitting: Internal Medicine

## 2022-07-12 DIAGNOSIS — I10 Essential (primary) hypertension: Secondary | ICD-10-CM | POA: Diagnosis not present

## 2022-07-12 DIAGNOSIS — E1149 Type 2 diabetes mellitus with other diabetic neurological complication: Secondary | ICD-10-CM | POA: Diagnosis not present

## 2022-07-12 DIAGNOSIS — E785 Hyperlipidemia, unspecified: Secondary | ICD-10-CM | POA: Diagnosis not present

## 2022-08-03 ENCOUNTER — Telehealth: Payer: Self-pay | Admitting: Internal Medicine

## 2022-08-06 ENCOUNTER — Telehealth: Payer: Self-pay | Admitting: Internal Medicine

## 2022-08-06 MED ORDER — IPRATROPIUM BROMIDE 0.02 % IN SOLN: 0.2500 mg | RESPIRATORY_TRACT | 11 refills | Status: DC | PRN

## 2022-08-06 NOTE — Telephone Encounter (Signed)
Pt's husband calling for a refill of ipratropium (ATROVENT) 0.02 % nebulizer solution [[id number]]   Pharmacy said for him to call us.  [facility]  His # [phone]  TY

## 2022-08-06 NOTE — Telephone Encounter (Signed)
Ok Rx Atrovent hfa inhaler   # 1, inhale 2 puffs every 6 hours as needed, refill x 5 Try this instead of Breztri. Ok to still use her albuterol (ventolin)

## 2022-08-06 NOTE — Telephone Encounter (Signed)
Nebulizer medication refilled. Nothing further needed

## 2022-08-06 NOTE — Telephone Encounter (Signed)
Received a call from pt's friend Peyton Najjar who states pt is requesting to have an Rx for Atrovent HFA inhaler sent to the pharmacy. Pt said she was given this when she was last in the hospital and states that she has really enjoyed this inhaler and due to this, pt is wanting to have an rx sent in.  [facility] [place].  Dr. Maple Hudson, please advise on this for pt.   Allergies  Allergen Reactions   Welchol [Colesevelam Hcl] Hives   Coreg [Carvedilol] Other (See Comments)    Dizziness    Micardis [Telmisartan] Itching   Mobic [Meloxicam] Other (See Comments)    Unknown reaction   Norco [Hydrocodone-Acetaminophen] Other (See Comments)    "Did not feel right"   Norvasc [Amlodipine] Swelling    Ankle swelling   Sulfa Antibiotics Itching   Ultram [Tramadol] Nausea Only    Pt OK to take half tablets (  doses)   Celebrex [Celecoxib] Hives, Itching and Rash   Glucosamine Itching and Rash   Lipitor [Atorvastatin] Rash and Other (See Comments)    Foot pain   Statins Rash   Vibramycin [Doxycycline] Rash    Same reaction to Monohydrate and Hyclate     Current Outpatient Medications:    acetaminophen (TYLENOL) 325 MG tablet, Take 2 tablets (650 mg total) by mouth every 4 (four) hours as needed for mild pain, moderate pain, fever or headache. (Patient taking differently: Take 650 mg by mouth every 4 (four) hours as needed for mild pain, moderate pain, fever or headache (chills).), Disp: , Rfl:    acetaminophen-codeine (TYLENOL #2) 300-15 MG tablet, TAKE 1 TABLET BY MOUTH EVERY 6 HOURS AS NEEDED FOR PAIN, Disp: 30 tablet, Rfl: 1   albuterol (VENTOLIN HFA) 108 (90 Base) MCG/ACT inhaler, INHALE 2 PUFFS INTO THE LUNGS EVERY 6 HOURS AS NEEDED FOR WHEEZING OR SHORTNESS OF BREATH, Disp: 8.5 each, Rfl: 0   ALPRAZolam (XANAX) 0.5 MG tablet, Take 1 tablet (0.5 mg total) by mouth 2 (two) times daily as needed for anxiety., Disp: 10 tablet, Rfl: 0   aspirin EC 81 MG tablet, Take 81 mg by mouth  daily., Disp: , Rfl:    benzonatate (TESSALON) 200 MG capsule, TAKE 1 CAPSULE (200 MG TOTAL) BY MOUTH 3 (THREE) TIMES DAILY AS NEEDED FOR COUGH., Disp: 30 capsule, Rfl: 5   Budeson-Glycopyrrol-Formoterol (BREZTRI AEROSPHERE) 160-9-4.8 MCG/ACT AERO, Inhale 2 puffs into the lungs in the morning and at bedtime., Disp: 5.9 g, Rfl: 0   budesonide (PULMICORT) 0.25 MG/2ML nebulizer solution, Take 2 mLs (0.25 mg total) by nebulization 2 (two) times daily., Disp: 60 mL, Rfl: 11   carboxymethylcellulose (REFRESH PLUS) 0.5 % SOLN, Place 1 drop into both eyes daily as needed (dry eyes)., Disp: , Rfl:    cholecalciferol (VITAMIN D) 1000 UNITS tablet, Take 1,000 Units by mouth daily., Disp: , Rfl:    esomeprazole (NEXIUM) 40 MG capsule, Take 40 mg by mouth daily., Disp: , Rfl:    fluticasone (FLONASE) 50 MCG/ACT nasal spray, Place 1 spray into both nostrils daily., Disp: , Rfl:    glipiZIDE (GLUCOTROL) 5 MG tablet, Take 5 mg by mouth every morning., Disp: , Rfl:    guaiFENesin (MUCINEX) 600 MG 12 hr tablet, Take 600 mg by mouth every 12 (twelve) hours., Disp: , Rfl:    hydrALAZINE (APRESOLINE) 50 MG tablet, Take 50 mg by mouth 3 (three) times daily., Disp: , Rfl:    Insulin Regular Human (NOVOLIN R FLEXPEN RELION) 100 UNIT/ML  KwikPen, Inject 0-10 Units into the skin See admin instructions. Inject 0-10 units, check BS before meals and at bedtime - adjust per sliding scale : BS < 70 : call MD BS 71-150 : 0 units BS 151-200: 2 units BS 201-250: 4 units BS 251-300: 6 units BS 301-350: 8 units BS 351-400: 10 units BS > 400 : call MD, Disp: , Rfl:    ipratropium (ATROVENT) 0.02 % nebulizer solution, Take 1.25 mLs (0.25 mg total) by nebulization every 4 (four) hours as needed for wheezing or shortness of breath. USE 1 VIAL IN NEBULIZER 4 TIMES DAILY Strength: 0.02 %, Disp: 360 mL, Rfl: 11   irbesartan (AVAPRO) 300 MG tablet, TAKE 1 TABLET BY MOUTH EVERY DAY (Patient taking differently: Take 300 mg by mouth daily.), Disp: 90  tablet, Rfl: 1   metoprolol tartrate (LOPRESSOR) 50 MG tablet, Take 1 tablet (50 mg total) by mouth 2 (two) times daily., Disp: 180 tablet, Rfl: 1   Multiple Vitamins-Minerals (PRESERVISION AREDS 2 PO), Take 1 capsule by mouth in the morning and at bedtime., Disp: , Rfl:    torsemide (DEMADEX) 20 MG tablet, Take 20 mg by mouth daily., Disp: , Rfl:    traMADol (ULTRAM) 50 MG tablet, Take 1 tablet (50 mg total) by mouth 2 (two) times daily as needed for moderate pain or severe pain., Disp: 10 tablet, Rfl: 0

## 2022-08-07 ENCOUNTER — Other Ambulatory Visit: Payer: Self-pay | Admitting: Internal Medicine

## 2022-08-07 MED ORDER — ATROVENT HFA 17 MCG/ACT IN AERS: 2.0000 | INHALATION_SPRAY | Freq: Four times a day (QID) | RESPIRATORY_TRACT | 12 refills | Status: DC | PRN

## 2022-08-07 NOTE — Telephone Encounter (Signed)
Rx for atrovent hfa has been sent to the pharmacy for patient.  Called and spoke with pt letting her know this was done and she verbalized understanding. Nothing further needed.

## 2022-08-16 ENCOUNTER — Other Ambulatory Visit: Payer: Self-pay | Admitting: Internal Medicine

## 2022-08-28 ENCOUNTER — Other Ambulatory Visit (HOSPITAL_COMMUNITY): Payer: Self-pay

## 2022-08-28 ENCOUNTER — Other Ambulatory Visit: Payer: Self-pay | Admitting: Internal Medicine

## 2022-08-28 NOTE — Telephone Encounter (Signed)
Per test claim Atrovent HFA is covered @ $213.86. Patient may have a deductible to meet. No alternatives in the same class.

## 2022-09-02 NOTE — Telephone Encounter (Signed)
Could she try Stiolto 1.25   1 puff daily?   It has some albuterol in it, but not a lot.

## 2022-09-12 DIAGNOSIS — J471 Bronchiectasis with (acute) exacerbation: Secondary | ICD-10-CM | POA: Diagnosis not present

## 2022-09-12 DIAGNOSIS — R531 Weakness: Secondary | ICD-10-CM | POA: Diagnosis not present

## 2022-09-12 DIAGNOSIS — E46 Unspecified protein-calorie malnutrition: Secondary | ICD-10-CM | POA: Diagnosis not present

## 2022-09-12 DIAGNOSIS — Z681 Body mass index (BMI) 19 or less, adult: Secondary | ICD-10-CM | POA: Diagnosis not present

## 2022-09-12 DIAGNOSIS — M1990 Unspecified osteoarthritis, unspecified site: Secondary | ICD-10-CM | POA: Diagnosis not present

## 2022-09-12 DIAGNOSIS — F411 Generalized anxiety disorder: Secondary | ICD-10-CM | POA: Diagnosis not present

## 2022-09-17 ENCOUNTER — Other Ambulatory Visit (HOSPITAL_COMMUNITY): Payer: Self-pay

## 2022-09-17 NOTE — Telephone Encounter (Signed)
She has albuterol nebulizer solution, which can be used every 4 hours if needed. She also has albuterol hfa inhaler. Can she use and can she afford either one of these?

## 2022-09-17 NOTE — Telephone Encounter (Signed)
The only other ipratropium-albuterol inhaler is Combivent. Per test claim, this has a co-pay of $47.00

## 2022-10-07 DIAGNOSIS — E1149 Type 2 diabetes mellitus with other diabetic neurological complication: Secondary | ICD-10-CM | POA: Diagnosis not present

## 2022-10-07 DIAGNOSIS — R300 Dysuria: Secondary | ICD-10-CM | POA: Diagnosis not present

## 2022-10-07 DIAGNOSIS — R634 Abnormal weight loss: Secondary | ICD-10-CM | POA: Diagnosis not present

## 2022-10-07 DIAGNOSIS — Z681 Body mass index (BMI) 19 or less, adult: Secondary | ICD-10-CM | POA: Diagnosis not present

## 2022-10-07 DIAGNOSIS — E785 Hyperlipidemia, unspecified: Secondary | ICD-10-CM | POA: Diagnosis not present

## 2022-10-07 DIAGNOSIS — I10 Essential (primary) hypertension: Secondary | ICD-10-CM | POA: Diagnosis not present

## 2022-10-07 DIAGNOSIS — F411 Generalized anxiety disorder: Secondary | ICD-10-CM | POA: Diagnosis not present

## 2022-10-07 DIAGNOSIS — D509 Iron deficiency anemia, unspecified: Secondary | ICD-10-CM | POA: Diagnosis not present

## 2022-10-07 DIAGNOSIS — E559 Vitamin D deficiency, unspecified: Secondary | ICD-10-CM | POA: Diagnosis not present

## 2022-10-07 DIAGNOSIS — M549 Dorsalgia, unspecified: Secondary | ICD-10-CM | POA: Diagnosis not present

## 2022-10-07 DIAGNOSIS — E46 Unspecified protein-calorie malnutrition: Secondary | ICD-10-CM | POA: Diagnosis not present

## 2022-10-07 DIAGNOSIS — J471 Bronchiectasis with (acute) exacerbation: Secondary | ICD-10-CM | POA: Diagnosis not present

## 2022-11-13 ENCOUNTER — Telehealth: Payer: Self-pay | Admitting: Internal Medicine

## 2022-11-13 NOTE — Telephone Encounter (Signed)
89 YO lady coughing so bad can not sleep at night. Can Dr. Jeannie Fend call her in a syrup or something? Her number is [phone]  [facility] [address] on [address]  Hydro syrup called in before. Has been using neb, coughs up clear.   Last seen 06/25/21 but too weak to come in.

## 2022-11-13 NOTE — Telephone Encounter (Signed)
Symptoms for months. Cough with clear sputum. Wakes up during the night coughing. No fevers, chills or sweats. SOB all the time worse with exertion. This has increased since her last office visit in May. She does monitor her O2 at home. She took it while on the phone with me at it was 93%. 2L O2 at night. No wheezing.  Patient said she feels like she is getting weaker and weaker everyday.    Medications- Albuterol- TID Pulmicort- 3-4 times a day  Atrovent Neb- QID  Not using the Breztri- 2 puffs BID   I told the patient all the providers are out of the office until tomorrow and we will have to call her back then. I did encourage her to go to urgent care or the ED due to her weakness and SOB. She said she was not going anywhere tonight. She will wait for a call back tomorrow.

## 2022-11-14 MED ORDER — HYDROCODONE BIT-HOMATROP MBR 5-1.5 MG/5ML PO SOLN: ORAL | 0 refills | Status: DC

## 2022-11-14 NOTE — Telephone Encounter (Signed)
Cough syrup refilled. Hopefully she can tolerate small doses with food.

## 2022-11-14 NOTE — Telephone Encounter (Signed)
I spoke with the patient. She does not have any Breztri, from what she can remember it was too expensive. She does need more of the cough medication sent [facility] [place]. She will start using the O2 as needed during the day. I offered her an appt on Monday with Tammy and she said she was too weak to come in the office. She said she has been in the bed for a week.  I again suggested she been seen at Urgent Care or the ED. She said she just needs something for her cough.

## 2022-11-14 NOTE — Telephone Encounter (Signed)
I notified the patient. I also, scheduled her an appointment for 11/22/2022 at 11:00am, she only wanted to see Dr. Maple Hudson.  Nothing further needed.

## 2022-11-14 NOTE — Telephone Encounter (Signed)
If she still has Breztri, ask her to add it - inhale 2 puffs, then rinse mouth, two times daily  She can wear her home oxygen 2L during the daytime as well if it helps.  She has a chronic cough. Does she need her cough medicine refilled?  If these measures don't help, then suggest she go to ED. She would need to have blood work and maybe other tests that can be done much quicker there.

## 2022-11-22 ENCOUNTER — Ambulatory Visit: Payer: Medicare Other | Admitting: Internal Medicine

## 2022-11-22 ENCOUNTER — Encounter: Payer: Self-pay | Admitting: Internal Medicine

## 2022-11-22 NOTE — Progress Notes (Deleted)
HPI F never smoker, followed for bronchiectasis/ chronic bronchitis/ MAIC, complicated by hx of GERD, DM , arthritis, aortic atherosclerosis Sputum Cx 06/09/12 POS MAIC  Started 07/14/12- Biaxin 500mg  TIW, EMB 1200 TIW, Rifabutin 300 TIW  ended- 08/12/12 due to weakness and malaise,  CT chest 06/01/12- Progressive bilateral nodular bronchiectasis Office spirometry  01/18/13- moderate obstructive airways disease-FVC 2.10/69%, FEV1 1.25/58%,  FEV1/FVC  0.59/ 84%,  FEF 25-75% 0.47/33% --------------------------------------------------------------------------------  06/25/21- 89 year old female never smoker followed for Bronchiectasis/chronic bronchitis/MAIC, complicated by history GERD, DM2, arthritis,hyponatremia, aortic atherosclerosis, Lumbar DDD/ back pain, HTN, dCHF, Macular Degeneration, Covid infection [id number],  -Ventolin hfa, neb Perforomist/ Pulmicort/ Atrovent 0.02%, tessalon, tramadol, guaifenesicodeine,  Covid vax-2 Phizer Flu vax-had Hosp 1/2-1/4 Covid infection -----States she feels more sob, weakness.  Over time she is more aware of weakness, increased dyspnea with exertion, and persistent cough productive of clear mucus.   CXR reviewed. Complaining of dyspnea on exertion on arrival here today recorded room air O2 saturation was 95%. She has a flutter device used intermittently when she thinks she needs it. She has had 2 episodes of increased dizziness after taking her morning medication. She has some assistance in her home but has resisted invitations from her daughter to go live with family. CXR 04/23/21- IMPRESSION: 1. No convincing acute cardiopulmonary disease. 2. Chronic lung findings that reflect bronchiectasis, mucous plugging and interstitial thickening that are without change, better assessed on the CT dated 01/02/2021.  11/22/22-89 year old female never smoker followed for Bronchiectasis/chronic bronchitis/MAIC, complicated by history GERD, DM2, arthritis,hyponatremia, aortic  atherosclerosis, Lumbar DDD/ back pain, HTN, dCHF, Macular Degeneration, Covid infection [id number], Legally Blind,  O2 2L / -Ventolin hfa, neb Perforomist/ Pulmicort/ Atrovent 0.02%, tessalon, tramadol, guaifenesicodeine,     ROS-see HPI + = positive Constitutional:     +weight loss, night sweats, no-fevers, chills,  +fatigue, lassitude. HEENT:   No-  headaches, difficulty swallowing, +tooth/dental problems, no-sore throat,       No-  sneezing, itching, ear ache, +nasal congestion, post nasal drip, + declining eyesight CV:  + chest pain, orthopnea, PND, swelling in lower extremities, anasarca,  dizziness, palpitations Resp:   +shortness of breath with exertion or at rest.             +productive cough,  + non-productive cough,  No- coughing up of blood.              No-   change in color of mucus.  No- wheezing.   Skin: No-   rash or lesions. GI:  No-   heartburn, indigestion, abdominal pain, nausea, vomiting,  GU:  MS:  +Active low back and bilateral hip pain. L rib and L shoulder pain Neuro-     complaint of generalized weakness without myalgias Psych:  No- change in mood or affect. No depression or anxiety.  No memory loss.  Obj- Physical exam General- Alert, Oriented, Frail, NAD, gracious woman, trying to be stoic,  Skin- rash-none,  excoriation- none Lymphadenopathy- none Head- atraumatic            Eyes- + decreased vision            Ears- Hearing, canals-normal            Nose- Clear, no-Septal dev, mucus, polyps, erosion, perforation             Throat- Mallampati II , mucosa clear , drainage- none, tonsils- atrophic Neck- flexible , trachea midline, no stridor , thyroid nl, carotid no bruit Chest - symmetrical excursion ,  unlabored           Heart/CV- RRR , no murmur , no gallop  , no rub, nl s1 s2                           - JVD- none , edema- none, stasis changes- none, varices- none           Lung-   Cough+ with deep breath, + coarse crackles L back, no wheeze,   dullness-none, rub-none. + no crepitus Chest wall-  Abd-  Br/ Gen/ Rectal- Not done, not indicated Extrem-   No-clubbing, none, atrophy- none, strength- nl for age. + Cool/dusky feet Neuro- grossly intact to observation

## 2022-12-02 ENCOUNTER — Telehealth: Payer: Self-pay | Admitting: Internal Medicine

## 2022-12-03 NOTE — Telephone Encounter (Signed)
Elease Hashimoto from [employer] states patient needs to be requalified for oxygen. Elease Hashimoto phone number is [phone].

## 2022-12-05 NOTE — Telephone Encounter (Signed)
Dr. Maple Hudson please advise?  Patient states she is unable to come in to the office for apts, but [employer] is stating she will need to be re-qualified for oxygen. Patient was last seen in March 2023

## 2022-12-06 NOTE — Telephone Encounter (Signed)
I spoke with Elease Hashimoto from [employer]. She advised patient would need a office visit to be walked for oxygen re-certification. I advised patient was elderly and stating she couldn't come in. She stated would could do a my chart video visit and have patient walk at home and Dr. Maple Hudson could sign off that way or if patient had a home health nurse she could come in her home and walk her the same way and send sats to [employer].  I called patient and she states she doesn't have internet at home and doesn't have a home health nurse. I advised per [employer] we would need to see her. Dr. Maple Hudson I have her scheduled Tuesday. If you have any other suggestions please advise?

## 2022-12-06 NOTE — Telephone Encounter (Signed)
Patient is frail and elderly, not mobile. Can we use a video visit to recertify her home O2?

## 2022-12-08 NOTE — Progress Notes (Unsigned)
HPI F never smoker, followed for bronchiectasis/ chronic bronchitis/ MAIC, complicated by hx of GERD, DM , arthritis, aortic atherosclerosis Sputum Cx 06/09/12 POS MAIC  Started 07/14/12- Biaxin 500mg  TIW, EMB 1200 TIW, Rifabutin 300 TIW  ended- 08/12/12 due to weakness and malaise,  CT chest 06/01/12- Progressive bilateral nodular bronchiectasis Office spirometry  01/18/13- moderate obstructive airways disease-FVC 2.10/69%, FEV1 1.25/58%,  FEV1/FVC  0.59/ 84%,  FEF 25-75% 0.47/33% --------------------------------------------------------------------------------   02/21/21-  89 year old female never smoker followed for Bronchiectasis/chronic bronchitis/MAIC, complicated by history GERD, DM2, arthritis,hyponatremia, aortic atherosclerosis, Lumbar DDD/ back pain, HTN, dCHF, Maular Degenration,  -Ventolin hfa, neb Perforomist/ Pulmicort/ Atrovent 0.02%, tessalon, tramadol,  Covid vax-2 Phizer Flu vax-had Hosp 9/12-16/22- CAP-Multifocal on CT-Rx'd Rocephin, Zith, then augmentin -----C/o cough-clear, increased sob Recovered slowly from recent pneumonia.  Continues significantly limited by poor eyesight, back pain and debilitation. We discussed her CT scan from September.  Bronchiectasis assumed to reflect chronic microaspiration with appropriate discussion and education again done.  She high risk helpers and resists invitation to move in with her daughter. CTchest- 01/02/21- IMPRESSION: 1. Increased reticulonodular opacities compatible with bronchitis exacerbation/bronchopneumonia. 2. Background of bronchiectasis which has progressed from 2020. 3. Nonacute appearing right twelfth rib fracture. Partially covered and age-indeterminate right eleventh rib fracture. 4.  Aortic Atherosclerosis (ICD10-I70.0).  12/10/22- 89 year old female never smoker followed for Bronchiectasis/chronic bronchitis/MAIC, complicated by history GERD, DM2, arthritis,hyponatremia, aortic atherosclerosis, Lumbar DDD/ back pain,  HTN, dCHF, Maular Degenration,  -Ventolin hfa, neb Perforomist/ Pulmicort/ Atrovent 0.02%, tessalon, tramadol Hosp I November with CAP. Visit required by DME to renew certification for home O2  CXR 02/20/22- IMPRESSION: 1. New superimposed airspace disease within the left lower lobe concerning for pneumonia. 2. Chronic reticular and nodular interstitial opacities scattered throughout both lungs compatible with chronic lung disease.  ROS-see HPI + = positive Constitutional:     +weight loss, night sweats, no-fevers, chills,  +fatigue, lassitude. HEENT:   No-  headaches, difficulty swallowing, +tooth/dental problems, no-sore throat,       No-  sneezing, itching, ear ache, +nasal congestion, post nasal drip, + declining eyesight CV:  + chest pain, orthopnea, PND, swelling in lower extremities, anasarca,  dizziness, palpitations Resp: +Mild  shortness of breath with exertion or at rest.             +productive cough,  + non-productive cough,  No- coughing up of blood.              No-   change in color of mucus.  No- wheezing.   Skin: No-   rash or lesions. GI:  No-   heartburn, indigestion, abdominal pain, nausea, vomiting,  GU:  MS:  +Active low back and bilateral hip pain. L rib and L shoulder pain Neuro-     complaint of generalized weakness without myalgias Psych:  No- change in mood or affect. No depression or anxiety.  No memory loss.  Obj- Physical exam General- Alert, Oriented, Frail, NAD, gracious woman, trying to be stoic,  Skin- rash-none,  excoriation- none Lymphadenopathy- none Head- atraumatic            Eyes- + decreased vision            Ears- Hearing, canals-normal            Nose- Clear, no-Septal dev, mucus, polyps, erosion, perforation             Throat- Mallampati II , mucosa clear , drainage- none, tonsils- atrophic Neck- flexible , trachea midline, no  stridor , thyroid nl, carotid no bruit Chest - symmetrical excursion , unlabored           Heart/CV- RRR , no  murmur , no gallop  , no rub, nl s1 s2                           - JVD- none , edema- none, stasis changes- none, varices- none           Lung-   Cough+ with deep breath, + few rhonchi L back, no wheeze,  dullness-none, rub-none. + no crepitus Chest wall-  Abd-  Br/ Gen/ Rectal- Not done, not indicated Extrem-   No-clubbing, none, atrophy- none, strength- nl for age. + Cool/dusky feet Neuro- grossly intact to observation

## 2022-12-10 ENCOUNTER — Ambulatory Visit: Payer: Medicare Other | Admitting: Internal Medicine

## 2022-12-10 ENCOUNTER — Ambulatory Visit: Payer: Medicare Other

## 2022-12-10 ENCOUNTER — Encounter: Payer: Self-pay | Admitting: Internal Medicine

## 2022-12-10 VITALS — BP 120/70 | HR 72 | Ht 68.0 in | Wt 125.0 lb

## 2022-12-10 DIAGNOSIS — J471 Bronchiectasis with (acute) exacerbation: Secondary | ICD-10-CM

## 2022-12-10 DIAGNOSIS — R918 Other nonspecific abnormal finding of lung field: Secondary | ICD-10-CM | POA: Diagnosis not present

## 2022-12-10 DIAGNOSIS — J9611 Chronic respiratory failure with hypoxia: Secondary | ICD-10-CM | POA: Diagnosis not present

## 2022-12-10 DIAGNOSIS — J42 Unspecified chronic bronchitis: Secondary | ICD-10-CM | POA: Diagnosis not present

## 2022-12-10 MED ORDER — HYDROCODONE BIT-HOMATROP MBR 5-1.5 MG/5ML PO SOLN: ORAL | 0 refills | Status: DC

## 2022-12-10 MED ORDER — BENZONATATE 200 MG PO CAPS: 200.0000 mg | ORAL_CAPSULE | Freq: Three times a day (TID) | ORAL | 5 refills | Status: DC | PRN

## 2022-12-10 NOTE — Assessment & Plan Note (Signed)
Qualifies to continue home O2 at 2l continuous and portable

## 2022-12-10 NOTE — Assessment & Plan Note (Signed)
Chronic and progressive. Probably MAIC and recurrent microaspiration. Plan- CXR, CBC w diff, CMET

## 2022-12-10 NOTE — Patient Instructions (Addendum)
Order- CXR- dx Chronic Bronchitis  Order- lab- CBC w diff, CMET     dx Chronic bronchitis  [facility] and for tessalon perles for cough  Order- DME [employer]- continue home O2 2l dx cchronic hypoxic respiratory failure (resting O2 sat 89% on room air with peripheral edema)

## 2022-12-11 LAB — COMPREHENSIVE METABOLIC PANEL
ALT: 9 U/L (ref 0–35)
AST: 16 U/L (ref 0–37)
Albumin: 4.1 g/dL (ref 3.5–5.2)
Alkaline Phosphatase: 69 U/L (ref 39–117)
BUN: 49 mg/dL — ABNORMAL HIGH (ref 6–23)
CO2: 31 meq/L (ref 19–32)
Calcium: 9.5 mg/dL (ref 8.4–10.5)
Chloride: 92 meq/L — ABNORMAL LOW (ref 96–112)
Creatinine, Ser: 1.15 mg/dL (ref 0.40–1.20)
GFR: 40.63 mL/min — ABNORMAL LOW (ref >60.00)
Glucose, Bld: 111 mg/dL — ABNORMAL HIGH (ref 70–99)
Potassium: 4 mEq/L (ref 3.5–5.1)
Sodium: 133 meq/L — ABNORMAL LOW (ref 135–145)
Total Bilirubin: 0.5 mg/dL (ref 0.2–1.2)
Total Protein: 6.9 g/dL (ref 6.0–8.3)

## 2022-12-11 LAB — CBC WITH DIFFERENTIAL/PLATELET
Basophils Absolute: 0.1 10*3/uL (ref 0.0–0.1)
Basophils Relative: 1 % (ref 0.0–3.0)
Eosinophils Absolute: 0.3 10*3/uL (ref 0.0–0.7)
Eosinophils Relative: 3.3 % (ref 0.0–5.0)
HCT: 34.6 % — ABNORMAL LOW (ref 36.0–46.0)
Hemoglobin: 11.3 g/dL — ABNORMAL LOW (ref 12.0–15.0)
Lymphocytes Relative: 15.4 % (ref 12.0–46.0)
Lymphs Abs: 1.4 10*3/uL (ref 0.7–4.0)
MCHC: 32.7 g/dL (ref 30.0–36.0)
MCV: 89.1 fl (ref 78.0–100.0)
Monocytes Absolute: 1.1 10*3/uL — ABNORMAL HIGH (ref 0.1–1.0)
Monocytes Relative: 12.3 % — ABNORMAL HIGH (ref 3.0–12.0)
Neutro Abs: 6.3 10*3/uL (ref 1.4–7.7)
Neutrophils Relative %: 68 % (ref 43.0–77.0)
Platelets: 283 10*3/uL (ref 150.0–400.0)
RBC: 3.88 Mil/uL (ref 3.87–5.11)
RDW: 13.4 % (ref 11.5–15.5)
WBC: 9.3 10*3/uL (ref 4.0–10.5)

## 2022-12-19 ENCOUNTER — Other Ambulatory Visit: Payer: Self-pay

## 2022-12-19 MED ORDER — DOXYCYCLINE HYCLATE 100 MG PO TABS: 100.0000 mg | ORAL_TABLET | Freq: Two times a day (BID) | ORAL | 0 refills | Status: DC

## 2022-12-21 DIAGNOSIS — L98491 Non-pressure chronic ulcer of skin of other sites limited to breakdown of skin: Secondary | ICD-10-CM | POA: Diagnosis not present

## 2022-12-21 DIAGNOSIS — M10041 Idiopathic gout, right hand: Secondary | ICD-10-CM | POA: Diagnosis not present

## 2023-01-07 ENCOUNTER — Encounter (HOSPITAL_COMMUNITY): Payer: Self-pay

## 2023-01-07 ENCOUNTER — Encounter: Payer: Self-pay | Admitting: Internal Medicine

## 2023-01-07 ENCOUNTER — Emergency Department (HOSPITAL_COMMUNITY): Payer: Medicare Other

## 2023-01-07 ENCOUNTER — Inpatient Hospital Stay (HOSPITAL_COMMUNITY)
Admission: EM | Admit: 2023-01-07 | Discharge: 2023-01-10 | DRG: 641 | Disposition: A | Discharge status: Hospice | Payer: Medicare Other | Attending: Internal Medicine | Admitting: Internal Medicine

## 2023-01-07 ENCOUNTER — Other Ambulatory Visit: Payer: Self-pay

## 2023-01-07 DIAGNOSIS — N1831 Chronic kidney disease, stage 3a: Secondary | ICD-10-CM | POA: Diagnosis present

## 2023-01-07 DIAGNOSIS — E878 Other disorders of electrolyte and fluid balance, not elsewhere classified: Secondary | ICD-10-CM | POA: Diagnosis present

## 2023-01-07 DIAGNOSIS — E8809 Other disorders of plasma-protein metabolism, not elsewhere classified: Secondary | ICD-10-CM | POA: Diagnosis present

## 2023-01-07 DIAGNOSIS — M79604 Pain in right leg: Secondary | ICD-10-CM | POA: Diagnosis present

## 2023-01-07 DIAGNOSIS — J9611 Chronic respiratory failure with hypoxia: Secondary | ICD-10-CM | POA: Diagnosis not present

## 2023-01-07 DIAGNOSIS — I5032 Chronic diastolic (congestive) heart failure: Secondary | ICD-10-CM | POA: Diagnosis present

## 2023-01-07 DIAGNOSIS — I700 Atherosclerosis of aorta: Secondary | ICD-10-CM | POA: Diagnosis not present

## 2023-01-07 DIAGNOSIS — Z66 Do not resuscitate: Secondary | ICD-10-CM | POA: Diagnosis present

## 2023-01-07 DIAGNOSIS — Z794 Long term (current) use of insulin: Secondary | ICD-10-CM

## 2023-01-07 DIAGNOSIS — Z85828 Personal history of other malignant neoplasm of skin: Secondary | ICD-10-CM | POA: Diagnosis not present

## 2023-01-07 DIAGNOSIS — E1122 Type 2 diabetes mellitus with diabetic chronic kidney disease: Secondary | ICD-10-CM | POA: Diagnosis present

## 2023-01-07 DIAGNOSIS — Z1152 Encounter for screening for COVID-19: Secondary | ICD-10-CM

## 2023-01-07 DIAGNOSIS — Z515 Encounter for palliative care: Secondary | ICD-10-CM

## 2023-01-07 DIAGNOSIS — M2550 Pain in unspecified joint: Secondary | ICD-10-CM | POA: Diagnosis not present

## 2023-01-07 DIAGNOSIS — R54 Age-related physical debility: Secondary | ICD-10-CM | POA: Diagnosis present

## 2023-01-07 DIAGNOSIS — R2689 Other abnormalities of gait and mobility: Secondary | ICD-10-CM | POA: Diagnosis not present

## 2023-01-07 DIAGNOSIS — D649 Anemia, unspecified: Secondary | ICD-10-CM | POA: Diagnosis present

## 2023-01-07 DIAGNOSIS — K219 Gastro-esophageal reflux disease without esophagitis: Secondary | ICD-10-CM | POA: Diagnosis present

## 2023-01-07 DIAGNOSIS — Z881 Allergy status to other antibiotic agents status: Secondary | ICD-10-CM | POA: Diagnosis not present

## 2023-01-07 DIAGNOSIS — J984 Other disorders of lung: Secondary | ICD-10-CM | POA: Diagnosis not present

## 2023-01-07 DIAGNOSIS — Z7982 Long term (current) use of aspirin: Secondary | ICD-10-CM

## 2023-01-07 DIAGNOSIS — J4489 Other specified chronic obstructive pulmonary disease: Secondary | ICD-10-CM | POA: Diagnosis present

## 2023-01-07 DIAGNOSIS — J479 Bronchiectasis, uncomplicated: Secondary | ICD-10-CM

## 2023-01-07 DIAGNOSIS — M79605 Pain in left leg: Secondary | ICD-10-CM | POA: Diagnosis not present

## 2023-01-07 DIAGNOSIS — H547 Unspecified visual loss: Secondary | ICD-10-CM | POA: Diagnosis present

## 2023-01-07 DIAGNOSIS — R059 Cough, unspecified: Secondary | ICD-10-CM | POA: Diagnosis not present

## 2023-01-07 DIAGNOSIS — Z90710 Acquired absence of both cervix and uterus: Secondary | ICD-10-CM

## 2023-01-07 DIAGNOSIS — N179 Acute kidney failure, unspecified: Secondary | ICD-10-CM | POA: Diagnosis present

## 2023-01-07 DIAGNOSIS — E871 Hypo-osmolality and hyponatremia: Secondary | ICD-10-CM | POA: Diagnosis not present

## 2023-01-07 DIAGNOSIS — R531 Weakness: Secondary | ICD-10-CM

## 2023-01-07 DIAGNOSIS — G8929 Other chronic pain: Secondary | ICD-10-CM | POA: Diagnosis present

## 2023-01-07 DIAGNOSIS — Z79899 Other long term (current) drug therapy: Secondary | ICD-10-CM | POA: Diagnosis not present

## 2023-01-07 DIAGNOSIS — R627 Adult failure to thrive: Secondary | ICD-10-CM | POA: Diagnosis not present

## 2023-01-07 DIAGNOSIS — Z7984 Long term (current) use of oral hypoglycemic drugs: Secondary | ICD-10-CM

## 2023-01-07 DIAGNOSIS — Z886 Allergy status to analgesic agent status: Secondary | ICD-10-CM

## 2023-01-07 DIAGNOSIS — Z825 Family history of asthma and other chronic lower respiratory diseases: Secondary | ICD-10-CM

## 2023-01-07 DIAGNOSIS — Z923 Personal history of irradiation: Secondary | ICD-10-CM

## 2023-01-07 DIAGNOSIS — M545 Low back pain, unspecified: Secondary | ICD-10-CM | POA: Diagnosis not present

## 2023-01-07 DIAGNOSIS — J40 Bronchitis, not specified as acute or chronic: Principal | ICD-10-CM

## 2023-01-07 DIAGNOSIS — Z993 Dependence on wheelchair: Secondary | ICD-10-CM

## 2023-01-07 DIAGNOSIS — I130 Hypertensive heart and chronic kidney disease with heart failure and stage 1 through stage 4 chronic kidney disease, or unspecified chronic kidney disease: Secondary | ICD-10-CM | POA: Diagnosis present

## 2023-01-07 DIAGNOSIS — H9190 Unspecified hearing loss, unspecified ear: Secondary | ICD-10-CM | POA: Diagnosis present

## 2023-01-07 DIAGNOSIS — Z882 Allergy status to sulfonamides status: Secondary | ICD-10-CM

## 2023-01-07 DIAGNOSIS — Z7951 Long term (current) use of inhaled steroids: Secondary | ICD-10-CM

## 2023-01-07 DIAGNOSIS — R0902 Hypoxemia: Secondary | ICD-10-CM | POA: Diagnosis not present

## 2023-01-07 DIAGNOSIS — Z888 Allergy status to other drugs, medicaments and biological substances status: Secondary | ICD-10-CM

## 2023-01-07 DIAGNOSIS — Z7401 Bed confinement status: Secondary | ICD-10-CM | POA: Diagnosis not present

## 2023-01-07 LAB — CBC
HCT: 31.5 % — ABNORMAL LOW (ref 36.0–46.0)
Hemoglobin: 10.4 g/dL — ABNORMAL LOW (ref 12.0–15.0)
MCH: 29.5 pg (ref 26.0–34.0)
MCHC: 33 g/dL (ref 30.0–36.0)
MCV: 89.5 fL (ref 80.0–100.0)
Platelets: 199 10*3/uL (ref 150–400)
RBC: 3.52 MIL/uL — ABNORMAL LOW (ref 3.87–5.11)
RDW: 12.8 % (ref 11.5–15.5)
WBC: 7.5 10*3/uL (ref 4.0–10.5)
nRBC: 0 % (ref 0.0–0.2)

## 2023-01-07 LAB — COMPREHENSIVE METABOLIC PANEL
ALT: 12 U/L (ref 0–44)
AST: 18 U/L (ref 15–41)
Albumin: 2.9 g/dL — ABNORMAL LOW (ref 3.5–5.0)
Alkaline Phosphatase: 65 U/L (ref 38–126)
Anion gap: 11 (ref 5–15)
BUN: 37 mg/dL — ABNORMAL HIGH (ref 8–23)
CO2: 29 mmol/L (ref 22–32)
Calcium: 8.5 mg/dL — ABNORMAL LOW (ref 8.9–10.3)
Chloride: 88 mmol/L — ABNORMAL LOW (ref 98–111)
Creatinine, Ser: 1.09 mg/dL — ABNORMAL HIGH (ref 0.44–1.00)
GFR, Estimated: 47 mL/min — ABNORMAL LOW (ref >60)
Glucose, Bld: 153 mg/dL — ABNORMAL HIGH (ref 70–99)
Potassium: 4 mmol/L (ref 3.5–5.1)
Sodium: 128 mmol/L — ABNORMAL LOW (ref 135–145)
Total Bilirubin: 0.9 mg/dL (ref 0.3–1.2)
Total Protein: 6 g/dL — ABNORMAL LOW (ref 6.5–8.1)

## 2023-01-07 LAB — C-REACTIVE PROTEIN: CRP: 10.9 mg/dL — ABNORMAL HIGH (ref <1.0)

## 2023-01-07 LAB — LIPASE, BLOOD: Lipase: 34 U/L (ref 11–51)

## 2023-01-07 LAB — URIC ACID: Uric Acid, Serum: 6.2 mg/dL (ref 2.5–7.1)

## 2023-01-07 LAB — SEDIMENTATION RATE: Sed Rate: 58 mm/h — ABNORMAL HIGH (ref 0–22)

## 2023-01-07 LAB — SARS CORONAVIRUS 2 BY RT PCR: SARS Coronavirus 2 by RT PCR: NEGATIVE

## 2023-01-07 LAB — CBG MONITORING, ED: Glucose-Capillary: 178 mg/dL — ABNORMAL HIGH (ref 70–99)

## 2023-01-07 LAB — CK: Total CK: 66 U/L (ref 38–234)

## 2023-01-07 MED ORDER — METOPROLOL TARTRATE 25 MG PO TABS
50.0000 mg | ORAL_TABLET | Freq: Two times a day (BID) | ORAL | Status: DC
  Administered 2023-01-08 – 2023-01-10 (×5): 50 mg via ORAL
  Filled 2023-01-07 (×6): qty 2

## 2023-01-07 MED ORDER — OXYCODONE HCL 5 MG PO TABS: 5.0000 mg | ORAL_TABLET | Freq: Three times a day (TID) | ORAL | Status: DC | PRN

## 2023-01-07 MED ORDER — AZITHROMYCIN 250 MG PO TABS
500.0000 mg | ORAL_TABLET | Freq: Once | ORAL | Status: AC
  Administered 2023-01-07: 500 mg via ORAL
  Filled 2023-01-07: qty 2

## 2023-01-07 MED ORDER — MORPHINE SULFATE (PF) 4 MG/ML IV SOLN
4.0000 mg | Freq: Once | INTRAVENOUS | Status: AC
  Administered 2023-01-07: 4 mg via INTRAVENOUS
  Filled 2023-01-07: qty 1

## 2023-01-07 MED ORDER — IPRATROPIUM-ALBUTEROL 0.5-2.5 (3) MG/3ML IN SOLN: 3.0000 mL | Freq: Four times a day (QID) | RESPIRATORY_TRACT | Status: DC

## 2023-01-07 MED ORDER — SODIUM CHLORIDE 0.9 % IV SOLN: INTRAVENOUS | Status: AC

## 2023-01-07 MED ORDER — ENOXAPARIN SODIUM 30 MG/0.3ML IJ SOSY
30.0000 mg | PREFILLED_SYRINGE | INTRAMUSCULAR | Status: DC
  Administered 2023-01-07 – 2023-01-09 (×3): 30 mg via SUBCUTANEOUS
  Filled 2023-01-07 (×3): qty 0.3

## 2023-01-07 MED ORDER — ACETAMINOPHEN 325 MG PO TABS
650.0000 mg | ORAL_TABLET | Freq: Four times a day (QID) | ORAL | Status: DC
  Administered 2023-01-07 – 2023-01-10 (×10): 650 mg via ORAL
  Filled 2023-01-07 (×11): qty 2

## 2023-01-07 MED ORDER — SODIUM CHLORIDE 0.9 % IV SOLN
1.0000 g | Freq: Once | INTRAVENOUS | Status: AC
  Administered 2023-01-07: 1 g via INTRAVENOUS
  Filled 2023-01-07: qty 10

## 2023-01-07 MED ORDER — PANTOPRAZOLE SODIUM 40 MG PO TBEC
40.0000 mg | DELAYED_RELEASE_TABLET | Freq: Every day | ORAL | Status: DC
  Administered 2023-01-07 – 2023-01-10 (×4): 40 mg via ORAL
  Filled 2023-01-07 (×4): qty 1

## 2023-01-07 MED ORDER — ONDANSETRON HCL 4 MG/2ML IJ SOLN: 4.0000 mg | Freq: Four times a day (QID) | INTRAMUSCULAR | Status: DC | PRN

## 2023-01-07 MED ORDER — ONDANSETRON HCL 4 MG/2ML IJ SOLN
4.0000 mg | Freq: Once | INTRAMUSCULAR | Status: AC
  Administered 2023-01-07: 4 mg via INTRAVENOUS
  Filled 2023-01-07: qty 2

## 2023-01-07 MED ORDER — GUAIFENESIN ER 600 MG PO TB12: 600.0000 mg | ORAL_TABLET | Freq: Two times a day (BID) | ORAL | Status: DC | PRN

## 2023-01-07 MED ORDER — HYDROMORPHONE HCL 1 MG/ML IJ SOLN: 0.2500 mg | INTRAMUSCULAR | Status: DC | PRN

## 2023-01-07 MED ORDER — INSULIN ASPART 100 UNIT/ML IJ SOLN
0.0000 [IU] | Freq: Three times a day (TID) | INTRAMUSCULAR | Status: DC
  Administered 2023-01-07: 2 [IU] via SUBCUTANEOUS
  Administered 2023-01-08: 5 [IU] via SUBCUTANEOUS
  Administered 2023-01-08: 3 [IU] via SUBCUTANEOUS
  Administered 2023-01-08: 5 [IU] via SUBCUTANEOUS
  Administered 2023-01-09: 1 [IU] via SUBCUTANEOUS
  Administered 2023-01-09: 5 [IU] via SUBCUTANEOUS
  Administered 2023-01-09: 3 [IU] via SUBCUTANEOUS
  Administered 2023-01-10: 1 [IU] via SUBCUTANEOUS
  Administered 2023-01-10: 2 [IU] via SUBCUTANEOUS
  Filled 2023-01-07: qty 0.09

## 2023-01-07 MED ORDER — ALPRAZOLAM 0.5 MG PO TABS
0.5000 mg | ORAL_TABLET | Freq: Two times a day (BID) | ORAL | Status: DC | PRN
  Administered 2023-01-07 – 2023-01-10 (×6): 0.5 mg via ORAL
  Filled 2023-01-07 (×6): qty 1

## 2023-01-07 MED ORDER — HYDRALAZINE HCL 25 MG PO TABS
50.0000 mg | ORAL_TABLET | Freq: Three times a day (TID) | ORAL | Status: DC
  Administered 2023-01-08 – 2023-01-10 (×8): 50 mg via ORAL
  Filled 2023-01-07 (×9): qty 2

## 2023-01-07 MED ORDER — PREDNISONE 50 MG PO TABS
50.0000 mg | ORAL_TABLET | Freq: Once | ORAL | Status: AC
  Administered 2023-01-07: 50 mg via ORAL
  Filled 2023-01-07: qty 1

## 2023-01-07 MED ORDER — INSULIN ASPART 100 UNIT/ML IJ SOLN
0.0000 [IU] | Freq: Every day | INTRAMUSCULAR | Status: DC
  Administered 2023-01-07 – 2023-01-08 (×2): 3 [IU] via SUBCUTANEOUS
  Administered 2023-01-09: 2 [IU] via SUBCUTANEOUS
  Filled 2023-01-07: qty 0.05

## 2023-01-07 MED ORDER — ONDANSETRON HCL 4 MG PO TABS: 4.0000 mg | ORAL_TABLET | Freq: Four times a day (QID) | ORAL | Status: DC | PRN

## 2023-01-07 MED ORDER — IPRATROPIUM-ALBUTEROL 0.5-2.5 (3) MG/3ML IN SOLN
RESPIRATORY_TRACT | Status: AC
  Filled 2023-01-07: qty 3

## 2023-01-07 MED ORDER — IRBESARTAN 300 MG PO TABS
300.0000 mg | ORAL_TABLET | Freq: Every day | ORAL | Status: DC
  Administered 2023-01-08 – 2023-01-10 (×3): 300 mg via ORAL
  Filled 2023-01-07: qty 2
  Filled 2023-01-07: qty 1
  Filled 2023-01-07: qty 2
  Filled 2023-01-07 (×2): qty 1
  Filled 2023-01-07: qty 2
  Filled 2023-01-07: qty 1

## 2023-01-07 MED ORDER — IPRATROPIUM-ALBUTEROL 0.5-2.5 (3) MG/3ML IN SOLN
3.0000 mL | Freq: Three times a day (TID) | RESPIRATORY_TRACT | Status: DC
  Administered 2023-01-07 – 2023-01-09 (×7): 3 mL via RESPIRATORY_TRACT
  Filled 2023-01-07 (×6): qty 3

## 2023-01-07 MED ORDER — BUDESONIDE 0.25 MG/2ML IN SUSP
0.2500 mg | Freq: Two times a day (BID) | RESPIRATORY_TRACT | Status: DC
  Administered 2023-01-08 – 2023-01-10 (×5): 0.25 mg via RESPIRATORY_TRACT
  Filled 2023-01-07 (×5): qty 2

## 2023-01-07 MED ORDER — IPRATROPIUM-ALBUTEROL 0.5-2.5 (3) MG/3ML IN SOLN
3.0000 mL | Freq: Once | RESPIRATORY_TRACT | Status: AC
  Administered 2023-01-07: 3 mL via RESPIRATORY_TRACT
  Filled 2023-01-07: qty 3

## 2023-01-07 MED ORDER — IPRATROPIUM-ALBUTEROL 0.5-2.5 (3) MG/3ML IN SOLN: 3.0000 mL | Freq: Four times a day (QID) | RESPIRATORY_TRACT | Status: DC | PRN

## 2023-01-07 MED ORDER — PREDNISONE 5 MG PO TABS
10.0000 mg | ORAL_TABLET | Freq: Every day | ORAL | Status: DC
  Administered 2023-01-08 – 2023-01-10 (×3): 10 mg via ORAL
  Filled 2023-01-07 (×3): qty 2

## 2023-01-07 NOTE — H&P (Signed)
History and Physical    Patient: Katie Alvarado NUU:[id number] DOB: 1925/12/13 DOA: 01/07/2023 DOS: the patient was seen and examined on 01/07/2023 PCP: Daisy Floro, MD  Patient coming from: Home.  Uses wheelchair at baseline.  Has aide during the daytime.  Chief Complaint:  Chief Complaint  Patient presents with   Generalized Body Aches   Back Pain   HPI: Katie Alvarado is a 89 y.o. female with PMH of COPD, bronchiectasis, MAI, chronic hypoxic RF on 2.5 L, diastolic CHF, CKD-3A, DM-2, DDD, impaired vision, diminished hearing, diffuse arthralgia and wheelchair dependence presenting with generalized weakness and diffuse arthralgia.  Patient reports progressive generalized weakness for 4 to 5 weeks that has gotten worse over the last 4 to 5 days.  Also reports lower back pain and diffuse pain in her leg joints although she denies acute change in leg joint pain.  She rates her pain 10/10 before she came to ED. her pain improved after she received IV morphine in ED.  She denies fall or trauma.  She denies leg swelling.  She denies fever or chills.  She has chronic shortness of breath and cough unchanged from baseline.  She denies chest pain, nausea, vomiting or abdominal pain.  She denies UTI symptoms or focal neurosymptoms.  She lives by herself.  Has a wheelchair.  She has an aide during the day, and a friend that stays with her the evening hours.   Patient denies smoking cigarette, drinking alcohol recreational drug use.  She is not interested in cardiopulmonary resuscitation in an event of sudden cardiopulmonary arrest.   In ED, vitals stable. Na 128 (normal baseline). Cr 1.09.  BUN 32.  Hgb 10.4.  Otherwise, CMP and CBC without significant finding.  CK normal.  COVID-19 PCR nonreactive.  CXR with stable reticulonodular densities throughout both lungs most prominent in LLL.  Patient was given ceftriaxone, Zithromax, p.o. prednisone, IV morphine, Zofran and DuoNeb, and hospitalist service  called for admission for diffuse arthralgia, hyponatremia and possible lung infection.  Review of Systems: As mentioned in the history of present illness. All other systems reviewed and are negative. Past Medical History:  Diagnosis Date   Acute nasopharyngitis (common cold)    Arthritis    Bronchiectasis with acute exacerbation (HCC)    Cancer (HCC)    skin cancer   COPD (chronic obstructive pulmonary disease) (HCC)    sees dr. Maple Hudson    Cough    Diabetes mellitus    fasting 130-150   Dizziness    Esophageal reflux    History of radiation therapy 10/24/17- 12/05/17   Lower lip, 4.4 Gy X 10 fractions given twice weekly for a total dose of 44 Gy.   Hypertension    Insomnia, unspecified    Other symptoms involving nervous and musculoskeletal systems(781.99)    Pneumonia    hx of   Shortness of breath    exertion   Past Surgical History:  Procedure Laterality Date   ABDOMINAL HYSTERECTOMY     ANKLE FRACTURE SURGERY Left    APPENDECTOMY     BASAL CELL CARCINOMA EXCISION     NOSE   CATARACT EXTRACTION, BILATERAL     OUT PATIENT   COLON SURGERY     colon resection for diverticulitis   IR EPIDUROGRAPHY  05/07/2018   LUMBAR LAMINECTOMY/DECOMPRESSION MICRODISCECTOMY N/A 02/15/2013   Procedure: LUMBAR LAMINECTOMY/DECOMPRESSION MICRODISCECTOMY LUMBAR FOUR-FIVE;  Surgeon: Clydene Fake, MD;  Location: [facility];  Service: Neurosurgery;  Laterality: N/A;  VARICOSE VEIN SURGERY     Social History:  reports that she has never smoked. She has never used smokeless tobacco. She reports current alcohol use. She reports that she does not use drugs.  Allergies  Allergen Reactions   Welchol [Colesevelam Hcl] Hives   Coreg [Carvedilol] Other (See Comments)    Dizziness    Micardis [Telmisartan] Itching   Mobic [Meloxicam] Other (See Comments)    Unknown reaction   Norco [Hydrocodone-Acetaminophen] Other (See Comments)    "Did not feel right"   Norvasc [Amlodipine] Swelling    Ankle  swelling   Sulfa Antibiotics Itching   Ultram [Tramadol] Nausea Only    Pt OK to take half tablets (25mg  doses)   Celebrex [Celecoxib] Hives, Itching and Rash   Glucosamine Itching and Rash   Lipitor [Atorvastatin] Rash and Other (See Comments)    Foot pain   Statins Rash   Vibramycin [Doxycycline] Rash    Same reaction to Monohydrate and Hyclate    Family History  Problem Relation Age of Onset   Chronic bronchitis Father    Other Sister        heart trouble   Other Brother        heart trouble    Prior to Admission medications   Medication Sig Start Date End Date Taking? Authorizing Provider  acetaminophen (TYLENOL) 325 MG tablet Take 2 tablets (650 mg total) by mouth every 4 (four) hours as needed for mild pain, moderate pain, fever or headache. Patient taking differently: Take 650 mg by mouth every 4 (four) hours as needed for mild pain, moderate pain, fever or headache (chills). 02/18/22   Lewie Chamber, MD  acetaminophen-codeine (TYLENOL #2) 300-15 MG tablet TAKE 1 TABLET BY MOUTH EVERY 6 HOURS AS NEEDED FOR PAIN 03/07/22   Jetty Duhamel D, MD  albuterol (VENTOLIN HFA) 108 (90 Base) MCG/ACT inhaler INHALE 2 PUFFS INTO THE LUNGS EVERY 6 HOURS AS NEEDED FOR WHEEZING OR SHORTNESS OF BREATH 08/29/22   Young, Rennis Chris, MD  ALPRAZolam Prudy Feeler) 0.5 MG tablet Take 1 tablet (0.5 mg total) by mouth 2 (two) times daily as needed for anxiety. 02/18/22   Lewie Chamber, MD  aspirin EC 81 MG tablet Take 81 mg by mouth daily.    [provider]  benzonatate (TESSALON) 200 MG capsule Take 1 capsule (200 mg total) by mouth 3 (three) times daily as needed for cough. 12/10/22   Jetty Duhamel D, MD  budesonide (PULMICORT) 0.25 MG/2ML nebulizer solution Take 2 mLs (0.25 mg total) by nebulization 2 (two) times daily. 04/19/22   Jetty Duhamel D, MD  carboxymethylcellulose (REFRESH PLUS) 0.5 % SOLN Place 1 drop into both eyes daily as needed (dry eyes).    [provider]   cholecalciferol (VITAMIN D) 1000 UNITS tablet Take 1,000 Units by mouth daily.    [provider]  doxycycline (VIBRA-TABS) 100 MG tablet Take 1 tablet (100 mg total) by mouth 2 (two) times daily. 12/19/22   Jetty Duhamel D, MD  esomeprazole (NEXIUM) 40 MG capsule Take 40 mg by mouth daily.    [provider]  fluticasone (FLONASE) 50 MCG/ACT nasal spray Place 1 spray into both nostrils daily. 02/05/22   [provider]  glipiZIDE (GLUCOTROL) 5 MG tablet Take 5 mg by mouth every morning. 02/14/21   [provider]  guaiFENesin (MUCINEX) 600 MG 12 hr tablet Take 600 mg by mouth every 12 (twelve) hours.    [provider]  hydrALAZINE (APRESOLINE) 50 MG  tablet Take 50 mg by mouth 3 (three) times daily. 03/12/21   [provider]  HYDROcodone bit-homatropine (HYCODAN) 5-1.5 MG/5ML syrup 2.5 ml by mouth every 6 hours if needed for cough. Take with food. 12/10/22   Jetty Duhamel D, MD  Insulin Regular Human (NOVOLIN R FLEXPEN RELION) 100 UNIT/ML KwikPen Inject 0-10 Units into the skin See admin instructions. Inject 0-10 units, check BS before meals and at bedtime - adjust per sliding scale : BS < 70 : call MD BS 71-150 : 0 units BS 151-200: 2 units BS 201-250: 4 units BS 251-300: 6 units BS 301-350: 8 units BS 351-400: 10 units BS > 400 : call MD    [provider]  ipratropium (ATROVENT HFA) 17 MCG/ACT inhaler Inhale 2 puffs into the lungs every 6 (six) hours as needed for wheezing. 08/07/22   Jetty Duhamel D, MD  ipratropium (ATROVENT) 0.02 % nebulizer solution Take 1.25 mLs (0.25 mg total) by nebulization every 4 (four) hours as needed for wheezing or shortness of breath. USE 1 VIAL IN NEBULIZER 4 TIMES DAILY Strength: 0.02 % 08/06/22   Young, Clinton D, MD  irbesartan (AVAPRO) 300 MG tablet TAKE 1 TABLET BY MOUTH EVERY DAY Patient taking differently: Take 300 mg by mouth daily. 10/04/21   Jake Bathe, MD  metoprolol tartrate  (LOPRESSOR) 50 MG tablet Take 1 tablet (50 mg total) by mouth 2 (two) times daily. 09/01/21   Almon Hercules, MD  Multiple Vitamins-Minerals (PRESERVISION AREDS 2 PO) Take 1 capsule by mouth in the morning and at bedtime.    [provider]  torsemide (DEMADEX) 20 MG tablet Take 20 mg by mouth daily. 12/29/21   [provider]  traMADol (ULTRAM) 50 MG tablet Take 1 tablet (50 mg total) by mouth 2 (two) times daily as needed for moderate pain or severe pain. 02/18/22   Lewie Chamber, MD    Physical Exam: Vitals:   01/07/23 1345 01/07/23 1400 01/07/23 1515 01/07/23 1600  BP:   113/62 99/83  Pulse: 77 76 74 74  Resp: 18 19 16 15   Temp:      TempSrc:      SpO2: 98% 99% 98% 100%   GENERAL: No apparent distress.  Nontoxic. HEENT: MMM.  Vision and hearing grossly intact.  NECK: Supple.  No apparent JVD.  RESP:  No IWOB.  Crackles bilaterally. CVS:  RRR. Heart sounds normal.  ABD/GI/GU: BS+. Abd soft, NTND.  MSK/EXT:   Some deformities in her fingers from arthritis. SKIN: no apparent skin lesion or wound NEURO: Awake and alert. Oriented appropriately.  No apparent focal neuro deficit. PSYCH: Calm. Normal affect.  Data Reviewed: See HPI  Assessment and Plan: Principal Problem:   Generalized weakness Active Problems:   Chronic diastolic CHF (congestive heart failure) (HCC)   BRONCHIECTASIS, + MAIC   Lower extremity pain, bilateral   Chronic back pain   Bronchiolectasis (HCC)   Impairment of balance   Stage 3a chronic kidney disease (CKD) (HCC)   Chronic respiratory failure with hypoxia (HCC)   Diffuse arthralgia   Generalized weakness/ambulatory dysfunction/diffuse arthralgia/bilateral lower extremity pain/chronic back pain: Acute on chronic bilateral lower extremity pain and progressive weakness.  She is wheelchair-bound at baseline.  Lives by herself.  She has a personal aide during the day and friend is helping.  Underwent and she can stay by herself.  She is  interested in hospice. -Check CRP, ESR and uric acid -Trial of gentle IV fluid -Scheduled Tylenol with  low-dose prednisone and as needed IV Dilaudid for pain control -PT/OT eval -Palliative consulted  Chronic COPD/bronchiectasis/MAIC/chronic hypoxic RF: Reports using 2.5 L oxygen at baseline.  CXR with stable chronic changes.  Doubt acute infection. -Continue home oxygen -Continue home inhalers after med rec.  Chronic diastolic CHF: Appears euvolemic.   -Closely monitor respiratory and fluid status while on IV fluid  Essential hypertension: Normotensive -Continue home meds after med rec  DM-2: A1c 6.8% in 2023. -SSI-sensitive  CKD-3A: Relatively stable. -Try gentle IV fluid  Hyponatremia -Trial of IV normal saline as above  Med rec pending at time of this dictation.    Advance Care Planning:   Code Status: Limited: Do not attempt resuscitation (DNR) -DNR-LIMITED -Do Not Intubate/DNI    Consults: Palliative medicine  Family Communication: Updated a friend at bedside  Severity of Illness: The appropriate patient status for this patient is OBSERVATION. Observation status is judged to be reasonable and necessary in order to provide the required intensity of service to ensure the patient's safety. The patient's presenting symptoms, physical exam findings, and initial radiographic and laboratory data in the context of their medical condition is felt to place them at decreased risk for further clinical deterioration. Furthermore, it is anticipated that the patient will be medically stable for discharge from the hospital within 2 midnights of admission.   Author: Almon Hercules, MD 01/07/2023 4:41 PM  For on call review [email or web address].

## 2023-01-07 NOTE — ED Notes (Signed)
ED TO INPATIENT HANDOFF REPORT  ED Nurse Name and Phone #: Suann Larry Name/Age/Gender Mickel Crow 89 y.o. female Room/Bed: WA13/WA13  Code Status   Code Status: Limited: Do not attempt resuscitation (DNR) -DNR-LIMITED -Do Not Intubate/DNI   Home/SNF/Other Skilled nursing facility Patient oriented to: self, place, time, and situation Is this baseline? Yes   Triage Complete: Triage complete  Chief Complaint Generalized weakness [R53.1]  Triage Note EMS reports pt from Home. Family called EMS due to pt weakness and decreased ability to perform ADLs (ambulate, get up, and, get around). This been going on for 4-5 weeks. Pt c/o pain all over her body, especially joins, feet, and back pain. Hx of COPD, Diabetes, A-fib.  In route 3L O2 nasal cannula BP 121/68 HR 90 Sp02 98 3L CBG 196   Allergies Allergies  Allergen Reactions   Welchol [Colesevelam Hcl] Hives   Coreg [Carvedilol] Other (See Comments)    Dizziness    Micardis [Telmisartan] Itching   Mobic [Meloxicam] Other (See Comments)    Unknown reaction   Norco [Hydrocodone-Acetaminophen] Other (See Comments)    "Did not feel right"   Norvasc [Amlodipine] Swelling    Ankle swelling   Sulfa Antibiotics Itching   Tramadol Nausea Only and Other (See Comments)    Pt OK to take half tablets (25mg  doses) GI Intolerance (Takes as needed in 2024)   Celebrex [Celecoxib] Hives, Itching and Rash   Glucosamine Itching and Rash   Lipitor [Atorvastatin] Rash and Other (See Comments)    Foot pain   Statins Rash   Vibramycin [Doxycycline] Rash and Other (See Comments)    Same reaction to Monohydrate and Hyclate    Level of Care/Admitting Diagnosis ED Disposition     ED Disposition  Admit   Condition  --   Comment  [facility] Area: [facility] [[id number]]  Level of Care: Med-Surg [16]  May place patient in observation at [facility] or [facility] if equivalent level of care is available:: No  Covid  Evaluation: Confirmed COVID Negative  Diagnosis: Generalized weakness [914782]  Admitting Physician: Almon Hercules [[id number]]  Attending Physician: Almon Hercules [[id number]]          B Medical/Surgery History Past Medical History:  Diagnosis Date   Acute nasopharyngitis (common cold)    Arthritis    Bronchiectasis with acute exacerbation (HCC)    Cancer (HCC)    skin cancer   COPD (chronic obstructive pulmonary disease) (HCC)    sees dr. Maple Hudson    Cough    Diabetes mellitus    fasting 130-150   Dizziness    Esophageal reflux    History of radiation therapy 10/24/17- 12/05/17   Lower lip, 4.4 Gy X 10 fractions given twice weekly for a total dose of 44 Gy.   Hypertension    Insomnia, unspecified    Other symptoms involving nervous and musculoskeletal systems(781.99)    Pneumonia    hx of   Shortness of breath    exertion   Past Surgical History:  Procedure Laterality Date   ABDOMINAL HYSTERECTOMY     ANKLE FRACTURE SURGERY Left    APPENDECTOMY     BASAL CELL CARCINOMA EXCISION     NOSE   CATARACT EXTRACTION, BILATERAL     OUT PATIENT   COLON SURGERY     colon resection for diverticulitis   IR EPIDUROGRAPHY  05/07/2018   LUMBAR LAMINECTOMY/DECOMPRESSION MICRODISCECTOMY N/A 02/15/2013   Procedure: LUMBAR LAMINECTOMY/DECOMPRESSION MICRODISCECTOMY LUMBAR FOUR-FIVE;  Surgeon: Clydene Fake, MD;  Location: [facility];  Service: Neurosurgery;  Laterality: N/A;   VARICOSE VEIN SURGERY       A IV Location/Drains/Wounds Patient Lines/Drains/Airways Status     Active Line/Drains/Airways     Name Placement date Placement time Site Days   Peripheral IV 01/07/23 20 G Anterior;Left Wrist 01/07/23  1317  Wrist  less than 1   External Urinary Catheter 02/20/22  2100  --  321            Intake/Output Last 24 hours No intake or output data in the 24 hours ending 01/07/23 1850  Labs/Imaging Results for orders placed or performed during the hospital encounter of 01/07/23  (from the past 48 hour(s))  CBC     Status: Abnormal   Collection Time: 01/07/23  1:20 PM  Result Value Ref Range   WBC 7.5 4.0 - 10.5 K/uL   RBC 3.52 (L) 3.87 - 5.11 MIL/uL   Hemoglobin 10.4 (L) 12.0 - 15.0 g/dL   HCT 86.5 (L) 78.4 - 69.6 %   MCV 89.5 80.0 - 100.0 fL   MCH 29.5 26.0 - 34.0 pg   MCHC 33.0 30.0 - 36.0 g/dL   RDW 29.5 28.4 - 13.2 %   Platelets 199 150 - 400 K/uL   nRBC 0.0 0.0 - 0.2 %    Comment: Performed at [facility], [address] [place], [place] [address]  Comprehensive metabolic panel     Status: Abnormal   Collection Time: 01/07/23  1:20 PM  Result Value Ref Range   Sodium 128 (L) 135 - 145 mmol/L   Potassium 4.0 3.5 - 5.1 mmol/L   Chloride 88 (L) 98 - 111 mmol/L   CO2 29 22 - 32 mmol/L   Glucose, Bld 153 (H) 70 - 99 mg/dL    Comment: Glucose reference range applies only to samples taken after fasting for at least 8 hours.   BUN 37 (H) 8 - 23 mg/dL   Creatinine, Ser 2.72 (H) 0.44 - 1.00 mg/dL   Calcium 8.5 (L) 8.9 - 10.3 mg/dL   Total Protein 6.0 (L) 6.5 - 8.1 g/dL   Albumin 2.9 (L) 3.5 - 5.0 g/dL   AST 18 15 - 41 U/L   ALT 12 0 - 44 U/L   Alkaline Phosphatase 65 38 - 126 U/L   Total Bilirubin 0.9 0.3 - 1.2 mg/dL   GFR, Estimated 47 (L) >60 mL/min    Comment: (NOTE) Calculated using the CKD-EPI Creatinine Equation (2021)    Anion gap 11 5 - 15    Comment: Performed at [facility], [address] [place], [place] [address]  SARS Coronavirus 2 by RT PCR (hospital order, performed in [facility] hospital lab) *cepheid single result test* Anterior Nasal Swab     Status: None   Collection Time: 01/07/23  1:20 PM   Specimen: Anterior Nasal Swab  Result Value Ref Range   SARS Coronavirus 2 by RT PCR NEGATIVE NEGATIVE    Comment: (NOTE) SARS-CoV-2 target nucleic acids are NOT DETECTED.  The SARS-CoV-2 RNA is generally detectable in upper and lower respiratory specimens during the acute phase of infection. The  lowest concentration of SARS-CoV-2 viral copies this assay can detect is 250 copies / mL. A negative result does not preclude SARS-CoV-2 infection and should not be used as the sole basis for treatment or other patient management decisions.  A negative result may occur with improper specimen collection /  handling, submission of specimen other than nasopharyngeal swab, presence of viral mutation(s) within the areas targeted by this assay, and inadequate number of viral copies (<250 copies / mL). A negative result must be combined with clinical observations, patient history, and epidemiological information.  Fact Sheet for Patients:   RoadLapTop.co.za  Fact Sheet for Healthcare Providers: http://kim-miller.com/  This test is not yet approved or  cleared by the [place] FDA and has been authorized for detection and/or diagnosis of SARS-CoV-2 by FDA under an Emergency Use Authorization (EUA).  This EUA will remain in effect (meaning this test can be used) for the duration of the COVID-19 declaration under Section 564(b)(1) of the Act, 21 U.S.C. section 360bbb-3(b)(1), unless the authorization is terminated or revoked sooner.  Performed at [facility], [address] [place], [place] [address]   CK     Status: None   Collection Time: 01/07/23  1:20 PM  Result Value Ref Range   Total CK 66 38 - 234 U/L    Comment: Performed at [facility], [address], [place], [place] [address]  Lipase, blood     Status: None   Collection Time: 01/07/23  1:20 PM  Result Value Ref Range   Lipase 34 11 - 51 U/L    Comment: Performed at [facility], [address], [place], [place] [address]  Sedimentation rate     Status: Abnormal   Collection Time: 01/07/23  1:20 PM  Result Value Ref Range   Sed Rate 58 (H) 0 - 22 mm/hr    Comment: Performed at [facility], [address], [place], [place] [address]  CBG monitoring, ED     Status: Abnormal   Collection Time: 01/07/23  4:56 PM  Result Value Ref Range   Glucose-Capillary 178 (H) 70 - 99 mg/dL    Comment: Glucose reference range applies only to samples taken after fasting for at least 8 hours.   DG Chest Portable 1 View  Result Date: 01/07/2023 CLINICAL DATA:  Cough, weakness. EXAM: PORTABLE CHEST 1 VIEW COMPARISON:  December 10, 2022.  February 20, 2022. FINDINGS: The heart size and mediastinal contours are within normal limits. Stable reticulonodular densities are noted throughout both lungs, most prominently seen in the left lung base. Severe degenerative changes are seen involving the left glenohumeral joint. IMPRESSION: Stable reticulonodular densities are noted throughout both lungs, most prominently seen in left lower lobe. This is concerning for chronic scarring or possibly atypical inflammation. Aortic Atherosclerosis (ICD10-I70.0). Electronically Signed   By: Lupita Raider M.D.   On: 01/07/2023 15:13    Pending Labs Unresulted Labs (From admission, onward)     Start     Ordered   01/14/23 0500  Creatinine, serum  (enoxaparin (LOVENOX)    CrCl >/= 30 ml/min)  Weekly,   R     Comments: while on enoxaparin therapy    01/07/23 1623   01/08/23 0500  Renal function panel  Tomorrow morning,   R        01/07/23 1641   01/08/23 0500  Magnesium  Tomorrow morning,   R        01/07/23 1641   01/08/23 0500  CBC  Tomorrow morning,   R        01/07/23 1641   01/07/23 1641  Hemoglobin A1c  Once,   R       Comments: To assess prior glycemic control    01/07/23 1640  01/07/23 1641  C-reactive protein  Once,   R        01/07/23 1641   01/07/23 1641  Uric acid  Once,   R        01/07/23 1641   01/07/23 1302  Urinalysis, Routine w reflex microscopic -Urine, Clean Catch  Once,   URGENT       Question:  Specimen Source  Answer:  Urine, Clean Catch   01/07/23 1302            Vitals/Pain Today's  Vitals   01/07/23 1626 01/07/23 1647 01/07/23 1745 01/07/23 1750  BP:   (!) 109/56   Pulse:   78   Resp:   18   Temp:  98 F (36.7 C)  (!) 97.4 F (36.3 C)  TempSrc:    Oral  SpO2:   98%   PainSc: 7        Isolation Precautions Airborne and Contact precautions  Medications Medications  enoxaparin (LOVENOX) injection 40 mg (has no administration in time range)  acetaminophen (TYLENOL) tablet 650 mg (650 mg Oral Given 01/07/23 1737)  HYDROmorphone (DILAUDID) injection 0.25 mg (has no administration in time range)  ondansetron (ZOFRAN) tablet 4 mg (has no administration in time range)    Or  ondansetron (ZOFRAN) injection 4 mg (has no administration in time range)  0.9 %  sodium chloride infusion ( Intravenous New Bag/Given 01/07/23 1744)  insulin aspart (novoLOG) injection 0-9 Units (2 Units Subcutaneous Given 01/07/23 1738)  insulin aspart (novoLOG) injection 0-5 Units (has no administration in time range)  predniSONE (DELTASONE) tablet 10 mg (has no administration in time range)  morphine (PF) 4 MG/ML injection 4 mg (4 mg Intravenous Given 01/07/23 1319)  ondansetron (ZOFRAN) injection 4 mg (4 mg Intravenous Given 01/07/23 1319)  ipratropium-albuterol (DUONEB) 0.5-2.5 (3) MG/3ML nebulizer solution 3 mL (3 mLs Nebulization Given 01/07/23 1320)  cefTRIAXone (ROCEPHIN) 1 g in sodium chloride 0.9 % 100 mL IVPB (0 g Intravenous Stopped 01/07/23 1726)  azithromycin (ZITHROMAX) tablet 500 mg (500 mg Oral Given 01/07/23 1601)  predniSONE (DELTASONE) tablet 50 mg (50 mg Oral Given 01/07/23 1622)    Mobility walks with device     Focused Assessments Bronchitis   R Recommendations: See Admitting Provider Note  Report given to:   Additional Notes: .

## 2023-01-07 NOTE — ED Notes (Signed)
Pt states she is hard of hearing and can not see too well.

## 2023-01-07 NOTE — ED Triage Notes (Signed)
EMS reports pt from Home. Family called EMS due to pt weakness and decreased ability to perform ADLs (ambulate, get up, and, get around). This been going on for 4-5 weeks. Pt c/o pain all over her body, especially joins, feet, and back pain. Hx of COPD, Diabetes, A-fib.  In route 3L O2 nasal cannula BP 121/68 HR 90 Sp02 98 3L CBG 196

## 2023-01-07 NOTE — ED Provider Notes (Signed)
[address] EMERGENCY DEPARTMENT AT [facility] Provider Note   CSN: [id number] Arrival date & time: 01/07/23  1206     History  Chief Complaint  Patient presents with   Generalized Body Aches   Back Pain    Katie Alvarado is a 89 y.o. female.   Back Pain    Patient has a history of COPD bronchiectasis, hypertension, diabetes, arthritis.  Patient presents to the ED for evaluation of diffuse bodyaches joint pain weakness.  Patient has been coughing more over the last couple of weeks.  Over the last 4 to 5 weeks she has been in increasing difficulty having pain in her joints.  It is in her back her arms or legs.  She has had increasing trouble over the last for 5 days where she is not able to get out of bed or walk now.  Anytime over makes the pain worse.  She has not noticed any particular focal areas of swelling or redness.  She has not had any recent falls or injuries.  She is not aware of any fevers  Home Medications Prior to Admission medications   Medication Sig Start Date End Date Taking? Authorizing Provider  acetaminophen (TYLENOL) 325 MG tablet Take 2 tablets (650 mg total) by mouth every 4 (four) hours as needed for mild pain, moderate pain, fever or headache. Patient taking differently: Take 650 mg by mouth every 4 (four) hours as needed for mild pain, moderate pain, fever or headache (chills). 02/18/22   Lewie Chamber, MD  acetaminophen-codeine (TYLENOL #2) 300-15 MG tablet TAKE 1 TABLET BY MOUTH EVERY 6 HOURS AS NEEDED FOR PAIN 03/07/22   Jetty Duhamel D, MD  albuterol (VENTOLIN HFA) 108 (90 Base) MCG/ACT inhaler INHALE 2 PUFFS INTO THE LUNGS EVERY 6 HOURS AS NEEDED FOR WHEEZING OR SHORTNESS OF BREATH 08/29/22   Young, Rennis Chris, MD  ALPRAZolam Prudy Feeler) 0.5 MG tablet Take 1 tablet (0.5 mg total) by mouth 2 (two) times daily as needed for anxiety. 02/18/22   Lewie Chamber, MD  aspirin EC 81 MG tablet Take 81 mg by mouth daily.    [provider]   benzonatate (TESSALON) 200 MG capsule Take 1 capsule (200 mg total) by mouth 3 (three) times daily as needed for cough. 12/10/22   Jetty Duhamel D, MD  budesonide (PULMICORT) 0.25 MG/2ML nebulizer solution Take 2 mLs (0.25 mg total) by nebulization 2 (two) times daily. 04/19/22   Jetty Duhamel D, MD  carboxymethylcellulose (REFRESH PLUS) 0.5 % SOLN Place 1 drop into both eyes daily as needed (dry eyes).    [provider]  cholecalciferol (VITAMIN D) 1000 UNITS tablet Take 1,000 Units by mouth daily.    [provider]  doxycycline (VIBRA-TABS) 100 MG tablet Take 1 tablet (100 mg total) by mouth 2 (two) times daily. 12/19/22   Jetty Duhamel D, MD  esomeprazole (NEXIUM) 40 MG capsule Take 40 mg by mouth daily.    [provider]  fluticasone (FLONASE) 50 MCG/ACT nasal spray Place 1 spray into both nostrils daily. 02/05/22   [provider]  glipiZIDE (GLUCOTROL) 5 MG tablet Take 5 mg by mouth every morning. 02/14/21   [provider]  guaiFENesin (MUCINEX) 600 MG 12 hr tablet Take 600 mg by mouth every 12 (twelve) hours.    [provider]  hydrALAZINE (APRESOLINE) 50 MG tablet Take 50 mg by mouth 3 (three) times daily. 03/12/21   [provider]  HYDROcodone bit-homatropine (HYCODAN) 5-1.5 MG/5ML syrup  2.5 ml by mouth every 6 hours if needed for cough. Take with food. 12/10/22   Jetty Duhamel D, MD  Insulin Regular Human (NOVOLIN R FLEXPEN RELION) 100 UNIT/ML KwikPen Inject 0-10 Units into the skin See admin instructions. Inject 0-10 units, check BS before meals and at bedtime - adjust per sliding scale : BS < 70 : call MD BS 71-150 : 0 units BS 151-200: 2 units BS 201-250: 4 units BS 251-300: 6 units BS 301-350: 8 units BS 351-400: 10 units BS > 400 : call MD    [provider]  ipratropium (ATROVENT HFA) 17 MCG/ACT inhaler Inhale 2 puffs into the lungs every 6 (six) hours as needed for wheezing. 08/07/22   Jetty Duhamel  D, MD  ipratropium (ATROVENT) 0.02 % nebulizer solution Take 1.25 mLs (0.25 mg total) by nebulization every 4 (four) hours as needed for wheezing or shortness of breath. USE 1 VIAL IN NEBULIZER 4 TIMES DAILY Strength: 0.02 % 08/06/22   Young, Clinton D, MD  irbesartan (AVAPRO) 300 MG tablet TAKE 1 TABLET BY MOUTH EVERY DAY Patient taking differently: Take 300 mg by mouth daily. 10/04/21   Jake Bathe, MD  metoprolol tartrate (LOPRESSOR) 50 MG tablet Take 1 tablet (50 mg total) by mouth 2 (two) times daily. 09/01/21   Almon Hercules, MD  Multiple Vitamins-Minerals (PRESERVISION AREDS 2 PO) Take 1 capsule by mouth in the morning and at bedtime.    [provider]  torsemide (DEMADEX) 20 MG tablet Take 20 mg by mouth daily. 12/29/21   [provider]  traMADol (ULTRAM) 50 MG tablet Take 1 tablet (50 mg total) by mouth 2 (two) times daily as needed for moderate pain or severe pain. 02/18/22   Lewie Chamber, MD      Allergies    Welchol Nolon Lennert hcl], Coreg [carvedilol], Micardis [telmisartan], Mobic [meloxicam], Norco [hydrocodone-acetaminophen], Norvasc [amlodipine], Sulfa antibiotics, Tramadol, Celebrex [celecoxib], Glucosamine, Lipitor [atorvastatin], Statins, and Vibramycin [doxycycline]    Review of Systems   Review of Systems  Musculoskeletal:  Positive for back pain.    Physical Exam Updated Vital Signs BP 99/83   Pulse 74   Temp 98 F (36.7 C)   Resp 15   SpO2 100%  Physical Exam Vitals and nursing note reviewed.  Constitutional:      Appearance: She is well-developed. She is not diaphoretic.     Comments: Elderly, frail  HENT:     Head: Normocephalic and atraumatic.     Right Ear: External ear normal.     Left Ear: External ear normal.  Eyes:     General: No scleral icterus.       Right eye: No discharge.        Left eye: No discharge.     Conjunctiva/sclera: Conjunctivae normal.  Neck:     Trachea: No tracheal deviation.  Cardiovascular:     Rate  and Rhythm: Normal rate and regular rhythm.  Pulmonary:     Effort: Pulmonary effort is normal. No respiratory distress.     Breath sounds: No stridor. Wheezing present. No rales.     Comments: Frequent coughing Abdominal:     General: Bowel sounds are normal. There is no distension.     Palpations: Abdomen is soft.     Tenderness: There is no abdominal tenderness. There is no guarding or rebound.  Musculoskeletal:        General: Tenderness present. No deformity.     Cervical back: Neck supple.  Comments: No focal areas of swelling or erythema noted, pain with passive range of motion of her bilateral upper extremities and lower extremities  Skin:    General: Skin is warm and dry.     Findings: No rash.  Neurological:     General: No focal deficit present.     Mental Status: She is alert.     Cranial Nerves: No cranial nerve deficit, dysarthria or facial asymmetry.     Sensory: No sensory deficit.     Motor: No abnormal muscle tone or seizure activity.     Coordination: Coordination normal.  Psychiatric:        Mood and Affect: Mood normal.     ED Results / Procedures / Treatments   Labs (all labs ordered are listed, but only abnormal results are displayed) Labs Reviewed  CBC - Abnormal; Notable for the following components:      Result Value   RBC 3.52 (*)    Hemoglobin 10.4 (*)    HCT 31.5 (*)    All other components within normal limits  COMPREHENSIVE METABOLIC PANEL - Abnormal; Notable for the following components:   Sodium 128 (*)    Chloride 88 (*)    Glucose, Bld 153 (*)    BUN 37 (*)    Creatinine, Ser 1.09 (*)    Calcium 8.5 (*)    Total Protein 6.0 (*)    Albumin 2.9 (*)    GFR, Estimated 47 (*)    All other components within normal limits  SARS CORONAVIRUS 2 BY RT PCR  CK  LIPASE, BLOOD  URINALYSIS, ROUTINE W REFLEX MICROSCOPIC  SEDIMENTATION RATE  HEMOGLOBIN A1C  C-REACTIVE PROTEIN  URIC ACID    EKG EKG Interpretation Date/Time:  Tuesday  January 07 2023 13:33:34 EDT Ventricular Rate:  77 PR Interval:  182 QRS Duration:  90 QT Interval:  390 QTC Calculation: 442 R Axis:   -17  Text Interpretation: Sinus rhythm Borderline left axis deviation No significant change since last tracing Confirmed by Linwood Dibbles [phone]) on 01/07/2023 1:37:06 PM  Radiology DG Chest Portable 1 View  Result Date: 01/07/2023 CLINICAL DATA:  Cough, weakness. EXAM: PORTABLE CHEST 1 VIEW COMPARISON:  December 10, 2022.  February 20, 2022. FINDINGS: The heart size and mediastinal contours are within normal limits. Stable reticulonodular densities are noted throughout both lungs, most prominently seen in the left lung base. Severe degenerative changes are seen involving the left glenohumeral joint. IMPRESSION: Stable reticulonodular densities are noted throughout both lungs, most prominently seen in left lower lobe. This is concerning for chronic scarring or possibly atypical inflammation. Aortic Atherosclerosis (ICD10-I70.0). Electronically Signed   By: Lupita Raider M.D.   On: 01/07/2023 15:13    Procedures Procedures    Medications Ordered in ED Medications  enoxaparin (LOVENOX) injection 40 mg (has no administration in time range)  acetaminophen (TYLENOL) tablet 650 mg (has no administration in time range)  HYDROmorphone (DILAUDID) injection 0.25 mg (has no administration in time range)  ondansetron (ZOFRAN) tablet 4 mg (has no administration in time range)    Or  ondansetron (ZOFRAN) injection 4 mg (has no administration in time range)  0.9 %  sodium chloride infusion (has no administration in time range)  insulin aspart (novoLOG) injection 0-9 Units (has no administration in time range)  insulin aspart (novoLOG) injection 0-5 Units (has no administration in time range)  predniSONE (DELTASONE) tablet 10 mg (has no administration in time range)  morphine (PF) 4 MG/ML injection 4  mg (4 mg Intravenous Given 01/07/23 1319)  ondansetron (ZOFRAN)  injection 4 mg (4 mg Intravenous Given 01/07/23 1319)  ipratropium-albuterol (DUONEB) 0.5-2.5 (3) MG/3ML nebulizer solution 3 mL (3 mLs Nebulization Given 01/07/23 1320)  cefTRIAXone (ROCEPHIN) 1 g in sodium chloride 0.9 % 100 mL IVPB (1 g Intravenous New Bag/Given 01/07/23 1606)  azithromycin (ZITHROMAX) tablet 500 mg (500 mg Oral Given 01/07/23 1601)  predniSONE (DELTASONE) tablet 50 mg (50 mg Oral Given 01/07/23 1622)    ED Course/ Medical Decision Making/ A&P Clinical Course as of 01/07/23 1650  Tue Jan 07, 2023  1416 CK Nl  [JK]  1416 CBC(!) Noted, hemoglobin stable [JK]  1417 Comprehensive metabolic panel(!) Hyponatremia and hypochloremia is new compared to previous [JK]  1417 Lipase, blood Normal [JK]  1524 Chest x-ray shows reticulonodular densities throughout both lungs.  Prominent in the left lower lobe.  Possible scarring or atypical infection [JK]  1525 CK and lipase are normal [JK]  1607 Case discussed with Dr Alanda Slim.  Will evaluate pt in the ED, does not think she meets criteria for admission based on initial discussion. [JK]    Clinical Course User Index [JK] Linwood Dibbles, MD                                 Medical Decision Making Problems Addressed: Arthralgia, unspecified joint: acute illness or injury that poses a threat to life or bodily functions Bronchitis: chronic illness or injury with exacerbation, progression, or side effects of treatment that poses a threat to life or bodily functions Hyponatremia: acute illness or injury that poses a threat to life or bodily functions  Amount and/or Complexity of Data Reviewed Labs: ordered. Decision-making details documented in ED Course. Radiology: ordered and independent interpretation performed.  Risk Prescription drug management. Decision regarding hospitalization.   Patient presented to the ED for evaluation of diffuse arthralgias worsening over the last several days.  And also has had increasing cough generalized  malaise.  No rash noted on exam or obvious joint swelling.  Patient however does have tenderness diffusely.  Patient coughing frequently in the ED. negative for COVID.  X-ray shows chronic lung findings.  Do think it is possible she might have an acute infection component contributing to her myalgias and arthralgias.  No evidence of rhabdo.  Patient also noted to be hypochloremic and hyponatremic.  Patient started on IV fluids and pain medications.  And IV antibiotics shaded.  With her diffuse arthralgias inability to ambulate without severe pain will consult the medical service for admission IV antibiotic pain management        Final Clinical Impression(s) / ED Diagnoses Final diagnoses:  Bronchitis  Arthralgia, unspecified joint  Hyponatremia    Rx / DC Orders ED Discharge Orders     None         Linwood Dibbles, MD 01/07/23 1650

## 2023-01-08 DIAGNOSIS — E8809 Other disorders of plasma-protein metabolism, not elsewhere classified: Secondary | ICD-10-CM | POA: Diagnosis present

## 2023-01-08 DIAGNOSIS — Z1152 Encounter for screening for COVID-19: Secondary | ICD-10-CM | POA: Diagnosis not present

## 2023-01-08 DIAGNOSIS — H9190 Unspecified hearing loss, unspecified ear: Secondary | ICD-10-CM | POA: Diagnosis present

## 2023-01-08 DIAGNOSIS — H547 Unspecified visual loss: Secondary | ICD-10-CM | POA: Diagnosis present

## 2023-01-08 DIAGNOSIS — Z794 Long term (current) use of insulin: Secondary | ICD-10-CM | POA: Diagnosis not present

## 2023-01-08 DIAGNOSIS — M545 Low back pain, unspecified: Secondary | ICD-10-CM | POA: Diagnosis not present

## 2023-01-08 DIAGNOSIS — E878 Other disorders of electrolyte and fluid balance, not elsewhere classified: Secondary | ICD-10-CM | POA: Diagnosis present

## 2023-01-08 DIAGNOSIS — Z881 Allergy status to other antibiotic agents status: Secondary | ICD-10-CM | POA: Diagnosis not present

## 2023-01-08 DIAGNOSIS — Z66 Do not resuscitate: Secondary | ICD-10-CM | POA: Diagnosis present

## 2023-01-08 DIAGNOSIS — R2689 Other abnormalities of gait and mobility: Secondary | ICD-10-CM | POA: Diagnosis not present

## 2023-01-08 DIAGNOSIS — I5032 Chronic diastolic (congestive) heart failure: Secondary | ICD-10-CM | POA: Diagnosis present

## 2023-01-08 DIAGNOSIS — D649 Anemia, unspecified: Secondary | ICD-10-CM | POA: Diagnosis present

## 2023-01-08 DIAGNOSIS — M2550 Pain in unspecified joint: Secondary | ICD-10-CM | POA: Diagnosis not present

## 2023-01-08 DIAGNOSIS — R54 Age-related physical debility: Secondary | ICD-10-CM | POA: Diagnosis present

## 2023-01-08 DIAGNOSIS — E1122 Type 2 diabetes mellitus with diabetic chronic kidney disease: Secondary | ICD-10-CM | POA: Diagnosis present

## 2023-01-08 DIAGNOSIS — J9611 Chronic respiratory failure with hypoxia: Secondary | ICD-10-CM | POA: Diagnosis present

## 2023-01-08 DIAGNOSIS — Z7401 Bed confinement status: Secondary | ICD-10-CM | POA: Diagnosis not present

## 2023-01-08 DIAGNOSIS — Z79899 Other long term (current) drug therapy: Secondary | ICD-10-CM | POA: Diagnosis not present

## 2023-01-08 DIAGNOSIS — E871 Hypo-osmolality and hyponatremia: Secondary | ICD-10-CM | POA: Diagnosis present

## 2023-01-08 DIAGNOSIS — I130 Hypertensive heart and chronic kidney disease with heart failure and stage 1 through stage 4 chronic kidney disease, or unspecified chronic kidney disease: Secondary | ICD-10-CM | POA: Diagnosis present

## 2023-01-08 DIAGNOSIS — M79605 Pain in left leg: Secondary | ICD-10-CM | POA: Diagnosis not present

## 2023-01-08 DIAGNOSIS — R531 Weakness: Secondary | ICD-10-CM | POA: Diagnosis not present

## 2023-01-08 DIAGNOSIS — N1831 Chronic kidney disease, stage 3a: Secondary | ICD-10-CM | POA: Diagnosis present

## 2023-01-08 DIAGNOSIS — Z515 Encounter for palliative care: Secondary | ICD-10-CM | POA: Diagnosis not present

## 2023-01-08 DIAGNOSIS — Z85828 Personal history of other malignant neoplasm of skin: Secondary | ICD-10-CM | POA: Diagnosis not present

## 2023-01-08 DIAGNOSIS — N179 Acute kidney failure, unspecified: Secondary | ICD-10-CM | POA: Diagnosis present

## 2023-01-08 DIAGNOSIS — J479 Bronchiectasis, uncomplicated: Secondary | ICD-10-CM | POA: Diagnosis present

## 2023-01-08 DIAGNOSIS — J4489 Other specified chronic obstructive pulmonary disease: Secondary | ICD-10-CM | POA: Diagnosis present

## 2023-01-08 DIAGNOSIS — Z7984 Long term (current) use of oral hypoglycemic drugs: Secondary | ICD-10-CM | POA: Diagnosis not present

## 2023-01-08 DIAGNOSIS — R0902 Hypoxemia: Secondary | ICD-10-CM | POA: Diagnosis not present

## 2023-01-08 DIAGNOSIS — G8929 Other chronic pain: Secondary | ICD-10-CM | POA: Diagnosis present

## 2023-01-08 DIAGNOSIS — M79604 Pain in right leg: Secondary | ICD-10-CM | POA: Diagnosis not present

## 2023-01-08 LAB — GLUCOSE, CAPILLARY
Glucose-Capillary: 223 mg/dL — ABNORMAL HIGH (ref 70–99)
Glucose-Capillary: 252 mg/dL — ABNORMAL HIGH (ref 70–99)
Glucose-Capillary: 265 mg/dL — ABNORMAL HIGH (ref 70–99)
Glucose-Capillary: 259 mg/dL — ABNORMAL HIGH (ref 70–99)

## 2023-01-08 MED ORDER — GUAIFENESIN ER 600 MG PO TB12
1200.0000 mg | ORAL_TABLET | Freq: Two times a day (BID) | ORAL | Status: DC
  Administered 2023-01-08 – 2023-01-10 (×4): 1200 mg via ORAL
  Filled 2023-01-08 (×4): qty 2

## 2023-01-08 NOTE — Progress Notes (Signed)
Pt is asleep and unable to do flutter valve at this time.

## 2023-01-08 NOTE — Hospital Course (Addendum)
The patient is a 89 year old Caucasian elderly female with past medical history is significant for but limited to COPD, bronchiectasis, MAI, chronic hypoxic respiratory failure on 2 L supplemental oxygen via nasal cannula, chronic diastolic CHF, CKD stage IIIa, diabetes mellitus type 2, degenerative disc disease, impaired vision, diminished hearing, diffuse arthralgia and wheelchair dependence as well as other comorbidities who presents with generalized weakness and diffuse arthralgia.  She reports progressive generalized weakness for 4 to 5 weeks and has gotten worse for the last 4 to 5 days.  She also reports lower back pain and diffuse leg pain in her joints although she denies any change in her leg joint pain.  Given her pain being a 10 out of 10 with her body aches she presented to the ED.  Her pain improved after she received IV morphine in the ED and she currently denies any fall or trauma.  Denies any leg swelling.  States that she has chronic shortness of breath and a cough that is unchanged from baseline.  Denies any urinary tract and symptoms and currently lives by herself and has an aide during the day and a friend that stays with her during the evening hours.  In the ED basic blood work was done and CK was normal.  COVID was nonreactive and a chest x-ray was done and showed stable reticulonodular densities throughout the lungs most prominent in the left lower lung.  Patient was given ceftriaxone, Zithromax, prednisone, morphine, Zofran and DuoNeb and the hospitalist was asked to admit this patient for diffuse orthopnea, hyponatremia and possible lung infection.  PT OT evaluated and PT recommended SNF for patient is interested in going home with hospice services.  Hospice liaison met with the patient and family and patient has elected for hospice at home and equipment will be delivered tomorrow and patient will be discharged after equipment is in place.  She states that her pain is improved and weakness  is also improving.  Assessment and Plan:  Generalized weakness/ambulatory dysfunction/diffuse arthralgia/bilateral lower extremity pain/chronic back pain -Acute on chronic bilateral lower extremity pain and progressive weakness.  She is wheelchair-bound at baseline.  Lives by herself.  -She has a personal aide during the day and friend is helping her so she can stay by herself.  She is interested in hospice. -Check CRP, ESR and uric acid -Trial of gentle IV fluid now stopped -Scheduled Tylenol with low-dose prednisone and as needed IV Dilaudid for pain control -PT/OT eval recommending SNF however patient has a caregiver at home and -Palliative consulted and patient is interested in discussing about hospice philosophy and meeting with the hospice liaison for home with home hospice services -Referral has been placed to [facility] and hospice liaison is to meet with the patient and discussion was had with family as well and patient verbalized understanding of the information given about the hospice philosophy and wishes to enroll in hospice services at home after discharge.  They are requesting a hospital bed and over bed table and oxygen if it needs to be switched due to the supply company.  Equipment to be delivered and plan is to discharge the patient home with hospice in the morning once equipment has been delivered -She is on scheduled Tylenol and IV hydromorphone initiated while hospitalized and will go home with adequate pain control   Chronic COPD/bronchiectasis/MAIC/chronic hypoxic RF:  -Reports using 2.5 L oxygen at baseline.  CXR with stable chronic changes.  Doubt acute infection. -Continue home oxygen -Continue nebulized medications  with budesonide 0.25 mg twice daily, DuoNeb 3 mL every 6 as needed for wheezing or shortness of breath, DuoNeb 3 mL neb 3 times daily and with guaifenesin but will increase from 600 p.o. twice daily as needed for cough to 1200 mg p.o. twice daily  scheduled -Add flutter valve and incentive spirometry   Chronic Diastolic CHF -Appears euvolemic.   -Closely monitored respiratory and fluid status while she was on IV fluid -IVF now stopped and will continue Strict I's and O's and Daily Weights;  Intake/Output Summary (Last 24 hours) at 01/10/2023 1928 Last data filed at 01/10/2023 1706 Gross per 24 hour  Intake 720 ml  Output 2400 ml  Net -1680 ml  -Continue to Monitor for S/Sx of Volume Overload   Essential Hypertension -Normotensive -Continue home meds with p.o. Hydralazine 50 mg 3 times daily and metoprolol tartrate 50 mg p.o. twice daily and irbesartan 300 mg p.o. daily -Continue to Monitor BP per Protocol -Last BP Reading was 138/66   DM-2 -A1c 6.8% in 2023. Now HbA1c is now 7.7 -Continue with sensitive NovoLog sliding scale insulin before meals and at bedtime -Continued to monitor CBGs per protocol and glucoses -Glucose Trend: Recent Labs  Lab 01/07/23 1320 01/08/23 0350 01/09/23 0415  GLUCOSE 153* 309* 143*  -CBG Trend: Recent Labs  Lab 01/09/23 0805 01/09/23 1208 01/09/23 1648 01/09/23 2108 01/10/23 0853 01/10/23 1157 01/10/23 1624  GLUCAP 147* 227* 267* 204* 168* 144* 250*   AKI on CKD Stage 3a, improved -BUN/Cr Trend: Recent Labs  Lab 01/07/23 1320 01/08/23 0350 01/09/23 0415  BUN 37* 43* 34*  CREATININE 1.09* 1.32* 0.87  -Avoid Nephrotoxic Medications, Contrast Dyes, Hypotension and Dehydration to Ensure Adequate Renal Perfusion and will need to Renally Adjust Meds -Continue to Monitor and Trend Renal Function carefully and repeat CMP in the AM   Normocytic Anemia -Hgb/Hct Trend: Recent Labs  Lab 01/07/23 1320 01/08/23 0350 01/09/23 0415  HGB 10.4* 9.9* 9.5*  HCT 31.5* 31.3* 29.7*  MCV 89.5 92.1 90.8  -Will not Check Anemia Panel in the AM now that patient is going to go home with Home Hospice tomorrow when equipment is delivered -Continue to Monitor for S/Sx of Bleeding; No overt bleeding  noted -Repeat CBC within 1 week if desired   Hypomagnesemia -Patient's Mag Level Trend: Recent Labs  Lab 01/08/23 0350 01/09/23 0415  MG 1.7 1.6*  -Replete with IV Mag Sulfate 2 grams -Continue to Monitor and Replete as Necessary -Repeat Mag within 1 week if desired   Hyponatremia -Na+ Trend: Recent Labs  Lab 01/07/23 1320 01/08/23 0350 01/09/23 0415  NA 128* 128* 132*  -Was given a trial of NS and now stopped -Continue to Monitor and Trend and repeat in the outpatient setting if wanted and necessary   GERD/GI Prophylaxis -Continue with PPI with Pantoprazole 40 mg p.o. daily  Hypoalbuminemia -Patient's Albumin Trend: Recent Labs  Lab 01/07/23 1320 01/08/23 0350 01/09/23 0415  ALBUMIN 2.9* 2.8* 2.6*  -Continue to Monitor and Trend and repeat CMP within 1 week

## 2023-01-08 NOTE — Consult Note (Signed)
Consultation Note Date: 01/08/2023   Patient Name: Katie Alvarado  DOB: [birth year]  MRN: [id number]  Age / Sex: 89 y.o., female  PCP: Daisy Floro, MD Referring Physician: Merlene Laughter, DO  Reason for Consultation: Establishing goals of care  HPI/Patient Profile: 89 y.o. female   admitted on 01/07/2023   Clinical Assessment and Goals of Care: 89 year old lady who lives at home by herself in [place], [place].  She has two children - who live out of town in [place] and in [place].  She also has a granddaughter who lives in [place].  At baseline, she uses a wheelchair.  She has an aide who comes during the daytime and has a longtime friend Mr. Retia Passe who is also available for assistance. Patient has past medical history significant for COPD, bronchiectasis, MAI, chronic hypoxic respiratory failure is on 2.5 L of supplemental oxygen via nasal cannula, diastolic CHF, stage III CKD, diabetes, impaired vision diminished hearing and diffuse arthralgias.  She is wheelchair dependent and has generalized weakness and diffuse arthralgias. Patient presented to the emergency department because of generalized weakness and diffuse lower back pain and pain in her joints. Patient has since been admitted to hospital medicine service for generalized weakness, ambulatory dysfunction, diffuse arthralgias in the setting of bilateral chronic lower extremity pain and chronic back pain. PT OT have been consulted, scheduled Tylenol and IV hydromorphone have been initiated. Palliative consult for goals of care discussions has been requested. Patient is awake alert resting in bed. Palliative medicine is specialized medical care for people living with serious illness. It focuses on providing relief from the symptoms and stress of a serious illness. The goal is to improve quality of life for both the patient and  the family. Goals of care: Broad aims of medical therapy in relation to the patient's values and preferences. Our aim is to provide medical care aimed at enabling patients to achieve the goals that matter most to them, given the circumstances of their particular medical situation and their constraints.  NEXT OF KIN Retia Passe friend [phone].  SUMMARY OF RECOMMENDATIONS   Goals of care discussions: Reviewed with the patient about her current condition.  Goals wishes and values attempted to be explored.  Patient values her independence and states that even though she lives alone she has a good support system with her friends and neighbors checking in on her on a daily basis in addition to her aide.  She wishes to be discharged home.  She asks about hospice services.  We explored hospice philosophy of care in some detail.  Patient is a interested in meeting with the hospice liaison to get more information.  Recommendation is for home with hospice services. Agree with DNR Patient's husband is deceased.  She states that he used to work for [employer] for decades.  She does not believe she has any prepared advance care planning documents/healthcare power of attorney documents.  Son and daughter's contact information is not listed on the chart.  For emergencies,  she wishes for her friend Retia Passe to be notified [phone].  Code Status/Advance Care Planning: DNR   Symptom Management:     Palliative Prophylaxis:  Delirium Protocol   Psycho-social/Spiritual:  Desire for further Chaplaincy support:yes Additional Recommendations: Education on Hospice  Prognosis:  < 6 months  Discharge Planning: Home with Hospice      Primary Diagnoses: Present on Admission:  Chronic respiratory failure with hypoxia (HCC)  Chronic diastolic CHF (congestive heart failure) (HCC)  Chronic back pain  Impairment of balance  Lower extremity pain, bilateral  Stage 3a chronic kidney disease (CKD)  (HCC)   I have reviewed the medical record, interviewed the patient and family, and examined the patient. The following aspects are pertinent.  Past Medical History:  Diagnosis Date   Acute nasopharyngitis (common cold)    Arthritis    Bronchiectasis with acute exacerbation (HCC)    Cancer (HCC)    skin cancer   COPD (chronic obstructive pulmonary disease) (HCC)    sees dr. Maple Hudson    Cough    Diabetes mellitus    fasting 130-150   Dizziness    Esophageal reflux    History of radiation therapy 10/24/17- 12/05/17   Lower lip, 4.4 Gy X 10 fractions given twice weekly for a total dose of 44 Gy.   Hypertension    Insomnia, unspecified    Other symptoms involving nervous and musculoskeletal systems(781.99)    Pneumonia    hx of   Shortness of breath    exertion   Social History   Socioeconomic History   Marital status: Widowed    Spouse name: Not on file   Number of children: 2   Years of education: Not on file   Highest education level: Not on file  Occupational History   Not on file  Tobacco Use   Smoking status: Never   Smokeless tobacco: Never  Vaping Use   Vaping status: Never Used  Substance and Sexual Activity   Alcohol use: Yes    Comment: "Hardly Ever"    Drug use: No   Sexual activity: Not Currently    Birth control/protection: Post-menopausal  Other Topics Concern   Not on file  Social History Narrative   Not on file   Social Determinants of Health   Financial Resource Strain: Not on file  Food Insecurity: No Food Insecurity (01/07/2023)   Hunger Vital Sign    Worried About Running Out of Food in the Last Year: Never true    Ran Out of Food in the Last Year: Never true  Transportation Needs: No Transportation Needs (01/07/2023)   PRAPARE - Administrator, Civil Service (Medical): No    Lack of Transportation (Non-Medical): No  Physical Activity: Not on file  Stress: Not on file  Social Connections: Not on file   Family History  Problem  Relation Age of Onset   Chronic bronchitis Father    Other Sister        heart trouble   Other Brother        heart trouble   Scheduled Meds:  acetaminophen  650 mg Oral Q6H WA   budesonide  0.25 mg Nebulization BID   enoxaparin (LOVENOX) injection  30 mg Subcutaneous Q24H   hydrALAZINE  50 mg Oral TID   insulin aspart  0-5 Units Subcutaneous QHS   insulin aspart  0-9 Units Subcutaneous TID WC   ipratropium-albuterol  3 mL Nebulization TID   irbesartan  300 mg Oral Daily  metoprolol tartrate  50 mg Oral BID   pantoprazole  40 mg Oral Daily   predniSONE  10 mg Oral Q breakfast   Continuous Infusions:  sodium chloride 75 mL/hr at 01/08/23 0901   PRN Meds:.ALPRAZolam, guaiFENesin, HYDROmorphone (DILAUDID) injection, ipratropium-albuterol, ondansetron **OR** ondansetron (ZOFRAN) IV, oxyCODONE Medications Prior to Admission:  Prior to Admission medications   Medication Sig Start Date End Date Taking? Authorizing Provider  acetaminophen (TYLENOL) 325 MG tablet Take 2 tablets (650 mg total) by mouth every 4 (four) hours as needed for mild pain, moderate pain, fever or headache. Patient taking differently: Take 650 mg by mouth every 4 (four) hours as needed for mild pain, moderate pain, fever or headache (or chills). 02/18/22  Yes Lewie Chamber, MD  acetaminophen-codeine (TYLENOL #2) 300-15 MG tablet TAKE 1 TABLET BY MOUTH EVERY 6 HOURS AS NEEDED FOR PAIN Patient taking differently: Take 1 tablet by mouth every 6 (six) hours as needed for moderate pain. 03/07/22  Yes Young, Joni Fears D, MD  albuterol (VENTOLIN HFA) 108 (90 Base) MCG/ACT inhaler INHALE 2 PUFFS INTO THE LUNGS EVERY 6 HOURS AS NEEDED FOR WHEEZING OR SHORTNESS OF BREATH Patient taking differently: Inhale 2 puffs into the lungs every 6 (six) hours as needed for wheezing or shortness of breath. 08/29/22  Yes Young, Joni Fears D, MD  ALPRAZolam Prudy Feeler) 0.5 MG tablet Take 1 tablet (0.5 mg total) by mouth 2 (two) times daily as needed for  anxiety. Patient taking differently: Take 0.5 mg by mouth 2 (two) times daily as needed for anxiety. Take 0.5 mg by mouth one to two times a day and an additional 0.5 mg once a day as needed for unresolved anxiety 02/18/22  Yes Lewie Chamber, MD  aspirin EC 81 MG tablet Take 81 mg by mouth daily.   Yes [provider]  benzonatate (TESSALON) 200 MG capsule Take 1 capsule (200 mg total) by mouth 3 (three) times daily as needed for cough. 12/10/22  Yes Young, Clinton D, MD  budesonide (PULMICORT) 0.25 MG/2ML nebulizer solution Take 2 mLs (0.25 mg total) by nebulization 2 (two) times daily. 04/19/22  Yes Young, Joni Fears D, MD  carboxymethylcellulose (REFRESH PLUS) 0.5 % SOLN Place 1 drop into both eyes 3 (three) times daily as needed (for dryness).   Yes [provider]  fluticasone (FLONASE) 50 MCG/ACT nasal spray Place 1 spray into both nostrils daily as needed for allergies or rhinitis. 02/05/22  Yes [provider]  glipiZIDE (GLUCOTROL) 5 MG tablet Take 5 mg by mouth See admin instructions. Take 5 mg by mouth in the morning before breakfast if BGL is over 200 02/14/21  Yes [provider]  guaiFENesin (MUCINEX) 600 MG 12 hr tablet Take 600 mg by mouth 2 (two) times daily as needed for cough or to loosen phlegm.   Yes [provider]  hydrALAZINE (APRESOLINE) 50 MG tablet Take 50 mg by mouth 3 (three) times daily. 03/12/21  Yes [provider]  HYDROcodone bit-homatropine (HYCODAN) 5-1.5 MG/5ML syrup 2.5 ml by mouth every 6 hours if needed for cough. Take with food. Patient taking differently: Take 2.5 mLs by mouth every 6 (six) hours as needed for cough (take with food). 12/10/22  Yes Young, Joni Fears D, MD  ipratropium (ATROVENT) 0.02 % nebulizer solution Take 1.25 mLs (0.25 mg total) by nebulization every 4 (four) hours as needed for wheezing or shortness of breath. USE 1 VIAL IN NEBULIZER 4 TIMES DAILY Strength: 0.02 % Patient taking differently:  Take 0.25 mg  by nebulization every 4 (four) hours as needed for wheezing or shortness of breath. 08/06/22  Yes Young, Clinton D, MD  irbesartan (AVAPRO) 300 MG tablet TAKE 1 TABLET BY MOUTH EVERY DAY Patient taking differently: Take 300 mg by mouth daily. 10/04/21  Yes Jake Bathe, MD  metoprolol tartrate (LOPRESSOR) 50 MG tablet Take 1 tablet (50 mg total) by mouth 2 (two) times daily. 09/01/21  Yes Almon Hercules, MD  Multiple Vitamins-Minerals (PRESERVISION AREDS 2 PO) Take 1 capsule by mouth in the morning and at bedtime.   Yes [provider]  NEXIUM 24HR 20 MG capsule Take 20 mg by mouth See admin instructions. Take 20 mg by mouth in the morning before breakfast and an additional 20 mg later in the day as needed for unresolved reflux   Yes [provider]  torsemide (DEMADEX) 20 MG tablet Take 20 mg by mouth in the morning. 12/29/21  Yes [provider]  traMADol (ULTRAM) 50 MG tablet Take 1 tablet (50 mg total) by mouth 2 (two) times daily as needed for moderate pain or severe pain. 02/18/22  Yes Lewie Chamber, MD  doxycycline (VIBRA-TABS) 100 MG tablet Take 1 tablet (100 mg total) by mouth 2 (two) times daily. Patient not taking: Reported on 01/07/2023 12/19/22   Jetty Duhamel D, MD  Insulin Regular Human (NOVOLIN R FLEXPEN RELION) 100 UNIT/ML KwikPen Inject 0-10 Units into the skin See admin instructions. Inject 0-10 units, check BS before meals and at bedtime - adjust per sliding scale : BS < 70 : call MD BS 71-150 : 0 units BS 151-200: 2 units BS 201-250: 4 units BS 251-300: 6 units BS 301-350: 8 units BS 351-400: 10 units BS > 400 : call MD Patient not taking: Reported on 01/07/2023    [provider]  ipratropium (ATROVENT HFA) 17 MCG/ACT inhaler Inhale 2 puffs into the lungs every 6 (six) hours as needed for wheezing. 08/07/22   Waymon Budge, MD   Allergies  Allergen Reactions   Cena Benton Hcl] Hives   Coreg [Carvedilol] Other (See  Comments)    Dizziness    Micardis [Telmisartan] Itching   Mobic [Meloxicam] Other (See Comments)    Unknown reaction   Norco [Hydrocodone-Acetaminophen] Other (See Comments)    "Did not feel right"   Norvasc [Amlodipine] Swelling    Ankle swelling   Sulfa Antibiotics Itching   Tramadol Nausea Only and Other (See Comments)    Pt OK to take half tablets (25mg  doses) GI Intolerance (Takes as needed in 2024)   Celebrex [Celecoxib] Hives, Itching and Rash   Glucosamine Itching and Rash   Lipitor [Atorvastatin] Rash and Other (See Comments)    Foot pain   Statins Rash   Vibramycin [Doxycycline] Rash and Other (See Comments)    Same reaction to Monohydrate and Hyclate   Review of Systems +decreased hearing.   Physical Exam Elderly lady resting in bed A little hard of hearing No acute distress Has arthritic changes upper extremities and hands Regular work of breathing Crackles bilaterally Awake alert oriented answers all questions appropriately Mood and affect within normal limits  Vital Signs: BP (!) 142/69 (BP Location: Right Arm)   Pulse 91   Temp 97.6 F (36.4 C)   Resp 18   SpO2 99%  Pain Scale: 0-10   Pain Score: 0-No pain   SpO2: SpO2: 99 % O2 Device:SpO2: 99 % O2 Flow Rate: .O2 Flow Rate (L/min): 2 L/min  IO: Intake/output summary:  Intake/Output Summary (Last 24 hours) at 01/08/2023 1302 Last data filed at 01/08/2023 1213 Gross per 24 hour  Intake 1428.98 ml  Output 300 ml  Net 1128.98 ml    LBM: Last BM Date : 01/07/23 Baseline Weight:   Most recent weight:       Palliative Assessment/Data:   PPS 50%  Time In:  12 Time Out:  1300 Time Total:  60  Greater than 50%  of this time was spent counseling and coordinating care related to the above assessment and plan.  Signed by: Rosalin Hawking, MD   Please contact Palliative Medicine Team phone at [phone] for questions and concerns.  For individual provider: See Loretha Stapler

## 2023-01-08 NOTE — Evaluation (Signed)
Occupational Therapy Evaluation Patient Details Name: Katie Alvarado MRN: [id number] DOB: August 02, 1925 Today's Date: 01/08/2023   History of Present Illness 89 yr old female brought to the hospital with weakness and diffuse arthralgia. PMH: arthritis, skin CA, ankle fracture surgery, COPD, bronchiectasis, chronic hypoxic respiratory failure on O2, diastolic heart failure, CKD 3, DM II, DDD, impaired vision, arthralgia   Clinical Impression   The pt is currently limited by the below listed deficits, which compromise her ADL performance and overall functional independence (see OT problem list). She also reported chronic pain and fine motor coordination deficits of her hands due to arthritis. She was further noted to have severe chronic L shoulder AROM limitations. Currently, she requires increased assist for tasks, including toileting, bathing, dressing, and bed mobility. She will benefit from further OT services to maximize her ADL performance and to decrease the risk for further weakness and deconditioning. She declines short-term SNF rehab, and prefers to return home with home health therapy services upon discharge.       If plan is discharge home, recommend the following: Help with stairs or ramp for entrance;Assist for transportation;Assistance with cooking/housework;Direct supervision/assist for medications management;A lot of help with bathing/dressing/bathroom;A lot of help with walking and/or transfers    Functional Status Assessment  Patient has had a recent decline in their functional status and demonstrates the ability to make significant improvements in function in a reasonable and predictable amount of time.  Equipment Recommendations  None recommended by OT    Recommendations for Other Services       Precautions / Restrictions Precautions Precautions: Fall Precaution Comments: O2 dep @ baseline Restrictions Weight Bearing Restrictions: No Other Position/Activity Restrictions:  O2 use at baseline. Vision impaired      Mobility Bed Mobility Overal bed mobility: Needs Assistance Bed Mobility: Rolling Rolling: Mod assist, Used rails    Scooting to the head of the bed: Max Assistance            ADL either performed or assessed with clinical judgement   ADL Overall ADL's : Needs assistance/impaired Eating/Feeding: Set up;Bed level Eating/Feeding Details (indicate cue type and reason): Requires assist to cut food, open packets and remove lids, due to pain, arthritic changes of hands, and impaired fine motor coordination. She reports improved ability to feed self with finger foods Grooming: Minimal assistance;Bed level       Lower Body Bathing: Maximal assistance;Bed level Lower Body Bathing Details (indicate cue type and reason): based on clinical judgement Upper Body Dressing : Maximal assistance;Bed level   Lower Body Dressing: Maximal assistance;Bed level       Toileting- Clothing Manipulation and Hygiene: Maximal assistance;Bed level               Vision   Additional Comments: Pt presents with chronic vision impairment. She was unable to read the time depicted on the wall clock.            Pertinent Vitals/Pain Pain Assessment Pain Assessment: Faces Pain Score: 4  Pain Location: chronic pain in hands Pain Intervention(s): Limited activity within patient's tolerance, Monitored during session     Extremity/Trunk Assessment Upper Extremity Assessment Upper Extremity Assessment:  RUE Deficits / Details: Chronic arthritic changes of hands. AROM WFL. Functional grip. Pt reports chronically impaired fine motor coordination LUE Deficits / Details: Chronic arthritic changes of hands. Functional grip. Pt reports chronically impaired fine motor coordination. Severe chronic shoulder AROM limitations with shoulder flexion <1/4 normal AROM   Lower Extremity Assessment Lower Extremity  Assessment: Generalized weakness LLE Deficits / Details:        Communication Communication Communication: Hearing impairment   Cognition Arousal: Alert Behavior During Therapy: WFL for tasks assessed/performed      General Comments: Oriented to person, place, month, and situation. Disoriented to year. Able to follow 1 step commands without difficulty.                Home Living Family/patient expects to be discharged to:: Private residence Living Arrangements: Non-relatives/Friends Available Help at Discharge: Personal care attendant;Friend(s) Type of Home: House Home Access: Ramped entrance     Home Layout: One level     Bathroom Shower/Tub: Estate manager/land agent Accessibility: Yes   Home Equipment: Rollator (4 wheels);BSC/3in1;Shower seat - built in;Wheelchair - power          Prior Functioning/Environment Prior Level of Function : Needs assist             Mobility Comments: Able to ambulate short household distances using a rollator. Electric wheelchair used in the community.  ADLs Comments: Has a home health aide 8:00 a.m.-12:00 p.m. Mon-Wed and Fri. Peyton Najjar, friend, comes at 55 PM and will spend the night if she feels poorly.  He takes care of dinner and takes her to MD appointments.The patient is normally modified independent with toileting at bedside commode level. Home health aide helps with bathing, dressing, meal prep, and cleaning. Uses 2.5L O2 as needed.        OT Problem List: Decreased strength;Decreased range of motion;Decreased activity tolerance;Impaired balance (sitting and/or standing);Impaired vision/perception;Decreased coordination;Impaired sensation;Impaired UE functional use;Pain      OT Treatment/Interventions: Self-care/ADL training;Therapeutic exercise;Energy conservation;DME and/or AE instruction;Therapeutic activities;Patient/family education;Balance training    OT Goals(Current goals can be found in the care plan section) Acute Rehab OT Goals Patient Stated Goal: to be able to walk  again OT Goal Formulation: With patient Time For Goal Achievement: 01/22/23 Potential to Achieve Goals: Fair ADL Goals Pt Will Perform Grooming: with set-up;sitting Pt Will Perform Upper Body Dressing: with set-up;sitting Pt Will Transfer to Toilet: with contact guard assist;stand pivot transfer;bedside commode Pt Will Perform Toileting - Clothing Manipulation and hygiene: with contact guard assist;sit to/from stand  OT Frequency: Min 1X/week       AM-PAC OT "6 Clicks" Daily Activity     Outcome Measure Help from another person eating meals?: A Little Help from another person taking care of personal grooming?: A Little Help from another person toileting, which includes using toliet, bedpan, or urinal?: A Lot Help from another person bathing (including washing, rinsing, drying)?: A Lot Help from another person to put on and taking off regular upper body clothing?: A Lot Help from another person to put on and taking off regular lower body clothing?: A Lot 6 Click Score: 14   End of Session Equipment Utilized During Treatment: Oxygen Nurse Communication: Mobility status  Activity Tolerance: Other (comment) (Fair tolerance) Patient left: in bed;with call bell/phone within reach;with bed alarm set  OT Visit Diagnosis: Muscle weakness (generalized) (M62.81);Pain;Other abnormalities of gait and mobility (R26.89) Pain - part of body:  (hands)                Time: 1050-1120 OT Time Calculation (min): 30 min Charges:  OT General Charges $OT Visit: 1 Visit OT Evaluation $OT Eval Moderate Complexity: 1 Mod    Sanyah Molnar L Acea Yagi, OTR/L  01/08/2023, 12:41 PM

## 2023-01-08 NOTE — TOC Initial Note (Signed)
Transition of Care [facility]) - Initial/Assessment Note    Patient Details  Name: Katie Alvarado MRN: [id number] Date of Birth: 1925/09/28  Transition of Care [facility]) CM/SW Contact:    Amada Jupiter, LCSW Phone Number: 01/08/2023, 3:40 PM  Clinical Narrative:                   Met with pt x 2 today to introduce TOC/ CSW role with dc planning and review therapy recommendations.  Initial visit made with just pt and she reports that she has an aide in the home in the mornings and then her friend,  Retia Passe arrives around noon and stays with her through the night.  She has neighbors and friends who assist intermittently as well.  Pt notes she is rarely alone in the home.  Explained to pt that therapy has recommended possible SNF for rehab, however, she is declining this and wishes to dc home.  She has spoken with palliative team here and interested in having hospice services in the home as well.  During 2nd visit to confirm hospice agency, Mr. Daphine Deutscher and pt's granddaughter were present.  Pt agreeable that I update them on our earlier conversation and they confirm information provided.  Mr. Daphine Deutscher notes, "there's hardly anytime that she is alone."  Able to confirm with pt her choice of [facility] hospice and have placed referral with [facility] liaison, Glenna Fellows, RN who plans to meet with pt in the morning.  TOC will continue to follow.  Expected Discharge Plan: Home w Hospice Care (vs. with HH follow up) Barriers to Discharge: Continued Medical Work up   Patient Goals and CMS Choice Patient states their goals for this hospitalization and ongoing recovery are:: return home          Expected Discharge Plan and Services In-house Referral: Clinical Social Work     Living arrangements for the past 2 months: Single Family Home                                      Prior Living Arrangements/Services Living arrangements for the past 2 months: Single Family Home Lives with:: Self Patient  language and need for interpreter reviewed:: Yes Do you feel safe going back to the place where you live?: Yes      Need for Family Participation in Patient Care: Yes (Comment) Care giver support system in place?: Yes (comment)   Criminal Activity/Legal Involvement Pertinent to Current Situation/Hospitalization: No - Comment as needed  Activities of Daily Living Home Assistive Devices/Equipment: Dentures (specify type), Wheelchair, Environmental consultant (specify type), Shower chair with back, Oxygen ADL Screening (condition at time of admission) Patient's cognitive ability adequate to safely complete daily activities?: Yes Is the patient deaf or have difficulty hearing?: Yes Does the patient have difficulty seeing, even when wearing glasses/contacts?: Yes Does the patient have difficulty concentrating, remembering, or making decisions?: Yes Patient able to express need for assistance with ADLs?: Yes Does the patient have difficulty dressing or bathing?: Yes Independently performs ADLs?: No Communication: Independent Dressing (OT): Needs assistance Is this a change from baseline?: Pre-admission baseline Grooming: Needs assistance Is this a change from baseline?: Pre-admission baseline Feeding: Needs assistance Is this a change from baseline?: Pre-admission baseline Bathing: Needs assistance Is this a change from baseline?: Pre-admission baseline Toileting: Needs assistance Is this a change from baseline?: Pre-admission baseline In/Out Bed: Needs assistance Is this a change from  baseline?: Pre-admission baseline Walks in Home: Dependent Is this a change from baseline?: Pre-admission baseline Does the patient have difficulty walking or climbing stairs?: Yes Weakness of Legs: Both Weakness of Arms/Hands: Both  Permission Sought/Granted Permission sought to share information with : Other (comment) Permission granted to share information with : Yes, Verbal Permission Granted  Share Information with  NAME: friend, Retia Passe @ [phone]           Emotional Assessment Appearance:: Appears stated age Attitude/Demeanor/Rapport: Self-Confident, Gracious Affect (typically observed): Accepting Orientation: : Oriented to Self, Oriented to Place, Oriented to  Time, Oriented to Situation Alcohol / Substance Use: Not Applicable Psych Involvement: No (comment)  Admission diagnosis:  Hyponatremia [E87.1] Bronchitis [J40] Generalized weakness [R53.1] Arthralgia, unspecified joint [M25.50] Patient Active Problem List   Diagnosis Date Noted   Diffuse arthralgia 01/07/2023   Chronic respiratory failure with hypoxia (HCC) 12/10/2022   HCAP (healthcare-associated pneumonia) 02/20/2022   Type 2 diabetes mellitus (HCC) 02/20/2022   Stage 3a chronic kidney disease (CKD) (HCC) 02/20/2022   Mild protein malnutrition (HCC) 02/20/2022   Acute bronchitis 08/30/2021   Acute respiratory failure with hypoxia (HCC) 08/30/2021   Pancreatic lesion 08/30/2021   Left groin pain 08/30/2021   2019 novel coronavirus disease (COVID-19) 04/23/2021   Palpitations 02/23/2021   Malnutrition of moderate degree 01/03/2021   COPD with acute exacerbation (HCC) 01/02/2021   Chronic diastolic CHF (congestive heart failure) (HCC) 01/02/2021   Anxiety 09/14/2020   Bronchiolectasis (HCC) 09/14/2020   Chronic anxiety 09/14/2020   Drug-induced myopathy 09/14/2020   Edema 09/14/2020   Gout 09/14/2020   History of pneumonia 09/14/2020   History of squamous cell carcinoma 09/14/2020   Hyperlipidemia 09/14/2020   Hypertensive heart disease without congestive heart failure 09/14/2020   Impairment of balance 09/14/2020   Iron deficiency anemia 09/14/2020   Legal blindness, as defined in [place] 09/14/2020   Lumbar radiculopathy 09/14/2020   Lumbar spondylosis 09/14/2020   Microalbuminuria 09/14/2020   Osteoarthritis of knee 09/14/2020   Pseudogout 09/14/2020   Solitary pulmonary nodule 09/14/2020   Vitamin D  deficiency 09/14/2020   Pain in right hand 08/08/2020   EKG abnormality 12/24/2019   Hypertensive urgency 12/24/2019   Acute hyponatremia 12/23/2019   Neck pain 12/13/2019   AKI (acute kidney injury) (HCC) 01/10/2019   Thrombocytosis 12/25/2018   Degeneration of lumbar intervertebral disc 10/28/2018   Left-sided chest wall pain 06/01/2018   Chronic back pain 05/05/2018   Low back pain radiating down leg 05/05/2018   Pneumonia of both lungs due to infectious organism 04/28/2018   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   Allergic urticaria 03/05/2016   Gastritis 03/05/2016   Chronic rhinitis 01/09/2016   Atypical mycobacterial infection of lung (HCC)    Normocytic anemia 07/18/2015   Bronchiectasis without complication (HCC) 04/25/2015   Renal artery stenosis (HCC) 04/25/2015   Uncontrolled hypertension 04/25/2015   Lower extremity pain, bilateral 08/04/2012   Klebsiella infection 08/01/2012   Uncontrolled NIDDM-2 with hyperglycemia 07/29/2012   Generalized weakness 07/29/2012   Hyponatremia 04/11/2011   Leucocytosis 04/11/2011   BRONCHIECTASIS, + MAIC 03/25/2010   DIZZINESS 02/02/2010   MUSCULOSKELETAL PAIN 09/25/2007   INSOMNIA 05/20/2007   ACUTE NASOPHARYNGITIS 04/20/2007   Acute exacerbation of bronchiectasis (HCC) 04/20/2007   GERD without esophagitis 04/20/2007   PCP:  Daisy Floro, MD Pharmacy:   [facility] - [place], [place] - [address] [place] [address] Phone: [phone] Fax: [phone]  [facility] [phone] - [place],  [place] - [address] [place] [address] Phone: [phone] Fax: [phone]     Social Determinants of Health (SDOH) Social History: SDOH Screenings   Food Insecurity: No Food Insecurity (01/07/2023)  Housing: Low Risk  (01/07/2023)  Transportation Needs: No Transportation Needs (01/07/2023)  Utilities: Not At Risk (01/07/2023)   Tobacco Use: Low Risk  (01/07/2023)   SDOH Interventions:     Readmission Risk Interventions    02/18/2022   12:08 PM 02/14/2022   11:23 AM  Readmission Risk Prevention Plan  Transportation Screening Complete Complete  Medication Review Oceanographer) Complete Complete  PCP or Specialist appointment within 3-5 days of discharge Complete Complete  HRI or Home Care Consult Complete Complete  SW Recovery Care/Counseling Consult Complete Complete  Palliative Care Screening Not Applicable Not Applicable  Skilled Nursing Facility Complete Complete

## 2023-01-08 NOTE — Evaluation (Signed)
Physical Therapy Evaluation Patient Details Name: Katie Alvarado MRN: [id number] DOB: [birth year] Today's Date: 01/08/2023  History of Present Illness  89 yr old female brought to the hospital with weakness and diffuse arthralgia. PMH: arthritis, skin CA, ankle fracture surgery, COPD, bronchiectasis, chronic hypoxic respiratory failure on O2, diastolic heart failure, CKD 3, DM II, DDD, impaired vision, arthralgia  Clinical Impression  On eval, pt required Mod A for bed mobility. She sat EOB for ~ 5 minutes with CGA. Pt was unable to stand 2* weakness and pain in L LE. She also has limited to no functional use of L UE. Assisted pt back to bed at end of session. Patient will benefit from continued inpatient follow up therapy, <3 hours/day, if pt is agreeable. Unsure of d/c plan at this time-some mention of hospice in chart. If pt chooses to return home, recommend HHPT if that is an option. Will follow during this hospital stay.        If plan is discharge home, recommend the following: Assistance with cooking/housework;Assist for transportation;Help with stairs or ramp for entrance;Two people to help with walking and/or transfers;A lot of help with bathing/dressing/bathroom   Can travel by private vehicle   No    Equipment Recommendations Hospital bed  Recommendations for Other Services  OT consult    Functional Status Assessment Patient has had a recent decline in their functional status and demonstrates the ability to make significant improvements in function in a reasonable and predictable amount of time.     Precautions / Restrictions Precautions Precautions: Fall Precaution Comments: O2 dep @ baseline Restrictions Weight Bearing Restrictions: No Other Position/Activity Restrictions: O2 dependent at baseline. Vision impaired      Mobility  Bed Mobility Overal bed mobility: Needs Assistance Bed Mobility: Supine to Sit, Sit to Supine     Supine to sit: Mod assist, HOB elevated,  Used rails Sit to supine: Mod assist   General bed mobility comments: Assist for trunk and LEs. Utilized bedpad for scooting, positioning at EOB. Increased time. Cues provided. Pt sat EOB for ~ 5 minutes with CGA. She performed a few LE exercises    Transfers                        Ambulation/Gait                  Stairs            Wheelchair Mobility     Tilt Bed    Modified Rankin (Stroke Patients Only)       Balance Overall balance assessment: Needs assistance Sitting-balance support: Bilateral upper extremity supported, Feet supported Sitting balance-Leahy Scale: Fair                                       Pertinent Vitals/Pain Pain Assessment Pain Assessment: 0-10 Pain Score: 5  Pain Location: L LE Pain Descriptors / Indicators: Grimacing, Guarding, Moaning Pain Intervention(s): Limited activity within patient's tolerance, Monitored during session, Repositioned    Home Living Family/patient expects to be discharged to:: Private residence Living Arrangements: Non-relatives/Friends Available Help at Discharge: Personal care attendant;Friend(s) Type of Home: House Home Access: Ramped entrance       Home Layout: One level Home Equipment: Rollator (4 wheels);BSC/3in1;Shower seat - built in;Wheelchair - power      Prior Function Prior Level of Function : Needs assist  Mobility Comments: minimal ambulation with RW inside home vs pivoting to get into wheelchair ADLs Comments: Home Health Aide 8A-12A Mon-Wed and Fri. Peyton Najjar, friend, comes at 37 PM and will spend the night if she feels poorly.  He takes care of dinner and takes her to MD appointments.The patient is normally modified independent with toileting. PCA helps with bathing, dressing, breakfast and lunch and other IADLs     Extremity/Trunk Assessment   Upper Extremity Assessment Upper Extremity Assessment: Defer to OT evaluation RUE Deficits /  Details: Chronic arthritic changes of hands. AROM WFL. Functional grip. Pt reports chronically impaired fine motor coordination LUE Deficits / Details: Chronic arthritic changes of hands. Functional grip. Pt reports chronically impaired fine motor coordination. Severe chronic shoulder AROM limitations with shoulder flexion <1/4 normal AROM    Lower Extremity Assessment Lower Extremity Assessment: Generalized weakness LLE Deficits / Details: uanble to fully assess 2* pain. knee ext 3-/5    Cervical / Trunk Assessment Cervical / Trunk Assessment: Kyphotic  Communication   Communication Communication: Hearing impairment  Cognition Arousal: Alert Behavior During Therapy: WFL for tasks assessed/performed Overall Cognitive Status: Within Functional Limits for tasks assessed                                 General Comments: sharp;very pleasant        General Comments      Exercises     Assessment/Plan    PT Assessment Patient needs continued PT services  PT Problem List Decreased strength;Decreased range of motion;Decreased balance;Decreased activity tolerance;Decreased mobility;Decreased knowledge of use of DME;Pain       PT Treatment Interventions DME instruction    PT Goals (Current goals can be found in the Care Plan section)  Acute Rehab PT Goals Patient Stated Goal: to go back home PT Goal Formulation: With patient Time For Goal Achievement: 01/22/23 Potential to Achieve Goals: Fair    Frequency Min 1X/week     Co-evaluation               AM-PAC PT "6 Clicks" Mobility  Outcome Measure Help needed turning from your back to your side while in a flat bed without using bedrails?: A Lot Help needed moving from lying on your back to sitting on the side of a flat bed without using bedrails?: A Lot Help needed moving to and from a bed to a chair (including a wheelchair)?: Total Help needed standing up from a chair using your arms (e.g., wheelchair or  bedside chair)?: Total Help needed to walk in hospital room?: Total Help needed climbing 3-5 steps with a railing? : Total 6 Click Score: 8    End of Session   Activity Tolerance: Patient tolerated treatment well;Patient limited by pain;Patient limited by fatigue Patient left: in bed;with call bell/phone within reach;with bed alarm set;with nursing/sitter in room   PT Visit Diagnosis: Muscle weakness (generalized) (M62.81);Pain;Difficulty in walking, not elsewhere classified (R26.2);Other abnormalities of gait and mobility (R26.89) Pain - part of body: Leg    Time: 4259-5638 PT Time Calculation (min) (ACUTE ONLY): 25 min   Charges:   PT Evaluation $PT Eval Low Complexity: 1 Low PT Treatments $Therapeutic Activity: 8-22 mins PT General Charges $$ ACUTE PT VISIT: 1 Visit           Faye Ramsay, PT Acute Rehabilitation  Office: [phone]

## 2023-01-08 NOTE — Progress Notes (Signed)
PROGRESS NOTE    TANISHA LUNDBLAD  WJX:[id number] DOB: 08-09-1925 DOA: 01/07/2023 PCP: Daisy Floro, MD   Brief Narrative:  The patient is a 89 year old Caucasian elderly female with past medical history is significant for but limited to COPD, bronchiectasis, MAI, chronic hypoxic respiratory failure on 2 L supplemental oxygen via nasal cannula, chronic diastolic CHF, CKD stage IIIa, diabetes mellitus type 2, degenerative disc disease, impaired vision, diminished hearing, diffuse arthralgia and wheelchair dependence as well as other comorbidities who presents with generalized weakness and diffuse arthralgia.  She reports progressive generalized weakness for 4 to 5 weeks and has gotten worse for the last 4 to 5 days.  She also reports lower back pain and diffuse leg pain in her joints although she denies any change in her leg joint pain.  Given her pain being a 10 out of 10 with her body aches she presented to the ED.  Her pain improved after she received IV morphine in the ED and she currently denies any fall or trauma.  Denies any leg swelling.  States that she has chronic shortness of breath and a cough that is unchanged from baseline.  Denies any urinary tract and symptoms and currently lives by herself and has an aide during the day and a friend that stays with her during the evening hours.  In the ED basic blood work was done and CK was normal.  COVID was nonreactive and a chest x-ray was done and showed stable reticulonodular densities throughout the lungs most prominent in the left lower lung.  Patient was given ceftriaxone, Zithromax, prednisone, morphine, Zofran and DuoNeb and the hospitalist was asked to admit this patient for diffuse orthopnea, hyponatremia and possible lung infection.  PT OT evaluated and PT recommended SNF for patient is interested in going home with hospice services.  Hospice liaison is to meet with the patient in the morning.  Assessment and Plan:  Generalized  weakness/ambulatory dysfunction/diffuse arthralgia/bilateral lower extremity pain/chronic back pain -Acute on chronic bilateral lower extremity pain and progressive weakness.  She is wheelchair-bound at baseline.  Lives by herself.  -She has a personal aide during the day and friend is helping her so she can stay by herself.  She is interested in hospice. -Check CRP, ESR and uric acid -Trial of gentle IV fluid now stopped -Scheduled Tylenol with low-dose prednisone and as needed IV Dilaudid for pain control -PT/OT eval recommending SNF however patient has a caregiver at home and -Palliative consulted and patient is interested in discussing about hospice philosophy and meeting with the hospice liaison for home with home hospice services -Referral has been placed to [facility] and hospice liaison is to meet with the patient in the morning   Chronic COPD/bronchiectasis/MAIC/chronic hypoxic RF:  -Reports using 2.5 L oxygen at baseline.  CXR with stable chronic changes.  Doubt acute infection. -Continue home oxygen -Continue nebulized medications with budesonide 0.25 mg twice daily, DuoNeb 3 mL every 6 as needed for wheezing or shortness of breath, DuoNeb 3 mL neb 3 times daily and with guaifenesin but will increase from 600 p.o. twice daily as needed for cough to 1200 mg p.o. twice daily scheduled -Add flutter valve and incentive spirometry   Chronic Diastolic CHF -Appears euvolemic.   -Closely monitored respiratory and fluid status while she was on IV fluid -IVF now stopped and will continue Strict I's and O's and Daily Weights;  Intake/Output Summary (Last 24 hours) at 01/08/2023 1933 Last data filed at 01/08/2023 1730 Gross  per 24 hour  Intake 2477.68 ml  Output 1300 ml  Net 1177.68 ml     Essential Hypertension -Normotensive -Continue home meds with p.o. Hydralazine 50 mg 3 times daily and metoprolol tartrate 50 mg p.o. twice daily and irbesartan 300 mg p.o. daily -Continue to  Monitor BP per Protocol -Last BP Reading was 130/62   DM-2 -A1c 6.8% in 2023. Now HbA1c is now 7.7 -Continue with sensitive NovoLog sliding scale insulin before meals and at bedtime -Continue monitor CBGs per protocol and glucoses -Glucose Trend: Recent Labs  Lab 12/10/22 1616 01/07/23 1320 01/08/23 0350  GLUCOSE 111* 153* 309*  -CBG Trend: Recent Labs  Lab 01/07/23 1656 01/07/23 2343 01/08/23 0753 01/08/23 1144 01/08/23 1655  GLUCAP 178* 277* 223* 252* 265*   AKI on CKD Stage 3a -BUN/Cr Trend: Recent Labs  Lab 12/10/22 1616 01/07/23 1320 01/08/23 0350  BUN 49* 37* 43*  CREATININE 1.15 1.09* 1.32*  -Avoid Nephrotoxic Medications, Contrast Dyes, Hypotension and Dehydration to Ensure Adequate Renal Perfusion and will need to Renally Adjust Meds -Continue to Monitor and Trend Renal Function carefully and repeat CMP in the AM   Normocytic Anemia -Hgb/Hct Trend: Recent Labs  Lab 12/10/22 1616 01/07/23 1320 01/08/23 0350  HGB 11.3* 10.4* 9.9*  HCT 34.6* 31.5* 31.3*  MCV 89.1 89.5 92.1  -Check Anemia Panel in the AM -Continue to Monitor for S/Sx of Bleeding; No overt bleeding noted -Repeat CBC in the AM  Hyponatremia -Na+ Trend: Recent Labs  Lab 12/10/22 1616 01/07/23 1320 01/08/23 0350  NA 133* 128* 128*  -Was given a trial of NS and now stopped  GERD/GI Prophylaxis -Continue with PPI with Pantoprazole 40 mg p.o. daily  Hypoalbuminemia -Patient's Albumin Trend: Recent Labs  Lab 12/10/22 1616 01/07/23 1320 01/08/23 0350  ALBUMIN 4.1 2.9* 2.8*  -Continue to Monitor and Trend and repeat CMP in the AM   DVT prophylaxis: enoxaparin (LOVENOX) injection 30 mg Start: 01/07/23 2200    Code Status: Limited: Do not attempt resuscitation (DNR) -DNR-LIMITED -Do Not Intubate/DNI  Family Communication: No family currently at bedside  Disposition Plan:  Level of care: Med-Surg Status is: Observation The patient will require care spanning > 2 midnights and  should be moved to inpatient because: Will need further goals of care discussion and hospice liaison's meeting on the patient in the morning   Consultants:  Palliative care medicine [facility] hosp[facility]  Procedures:  As delineated as above  Antimicrobials:  Anti-infectives (From admission, onward)    Start     Dose/Rate Route Frequency Ordered Stop   01/07/23 1530  cefTRIAXone (ROCEPHIN) 1 g in sodium chloride 0.9 % 100 mL IVPB        1 g 200 mL/hr over 30 Minutes Intravenous  Once 01/07/23 1526 01/07/23 1726   01/07/23 1530  azithromycin (ZITHROMAX) tablet 500 mg        500 mg Oral  Once 01/07/23 1526 01/07/23 1601       Subjective: Seen and examined at bedside and she was doing okay I think she is doing little bit better today however she is complaining of pain in her left great toe.  No nausea or vomiting.  Deny any other concerns or complaints this time but continues to feel weak.    Objective: Vitals:   01/08/23 1345 01/08/23 1351 01/08/23 1412 01/08/23 1730  BP: 130/67   130/62  Pulse: 75   82  Resp: 18     Temp:  98.6 F (37 C)  TempSrc:  Oral    SpO2: 98%  96%     Intake/Output Summary (Last 24 hours) at 01/08/2023 1940 Last data filed at 01/08/2023 1730 Gross per 24 hour  Intake 2477.68 ml  Output 1300 ml  Net 1177.68 ml   There were no vitals filed for this visit.  Examination: Physical Exam:  Constitutional: Thin elderly Caucasian female no acute distress appears calm Respiratory: Diminished to auscultation bilaterally with some coarse breath sounds, no wheezing, rales, rhonchi or crackles. Normal respiratory effort and patient is not tachypenic. No accessory muscle use.  Unlabored breathing Cardiovascular: RRR, no murmurs / rubs / gallops. S1 and S2 auscultated. No extremity edema.   Abdomen: Soft, non-tender, non-distended. Bowel sounds positive.  GU: Deferred. Musculoskeletal: No clubbing / cyanosis of digits/nails. No joint deformity upper and lower  extremities but has some pain when palpating her lower extremities Skin: No rashes, lesions, ulcers limited skin evaluation. No induration; Warm and dry.  Neurologic: CN 2-12 grossly intact with no focal deficits. Romberg sign cerebellar reflexes not assessed.  Psychiatric: Normal judgment and insight.  She is awake and alert  Data Reviewed: I have personally reviewed following labs and imaging studies  CBC: Recent Labs  Lab 01/07/23 1320 01/08/23 0350  WBC 7.5 3.1*  HGB 10.4* 9.9*  HCT 31.5* 31.3*  MCV 89.5 92.1  PLT 199 184   Basic Metabolic Panel: Recent Labs  Lab 01/07/23 1320 01/08/23 0350  NA 128* 128*  K 4.0 4.4  CL 88* 92*  CO2 29 25  GLUCOSE 153* 309*  BUN 37* 43*  CREATININE 1.09* 1.32*  CALCIUM 8.5* 8.4*  MG  --  1.7  PHOS  --  3.5   GFR: CrCl cannot be calculated (Unknown ideal weight.). Liver Function Tests: Recent Labs  Lab 01/07/23 1320 01/08/23 0350  AST 18  --   ALT 12  --   ALKPHOS 65  --   BILITOT 0.9  --   PROT 6.0*  --   ALBUMIN 2.9* 2.8*   Recent Labs  Lab 01/07/23 1320  LIPASE 34   No results for input(s): "AMMONIA" in the last 168 hours. Coagulation Profile: No results for input(s): "INR", "PROTIME" in the last 168 hours. Cardiac Enzymes: Recent Labs  Lab 01/07/23 1320  CKTOTAL 66   BNP (last 3 results) No results for input(s): "PROBNP" in the last 8760 hours. HbA1C: Recent Labs    01/07/23 1320  HGBA1C 7.7*   CBG: Recent Labs  Lab 01/07/23 1656 01/07/23 2343 01/08/23 0753 01/08/23 1144 01/08/23 1655  GLUCAP 178* 277* 223* 252* 265*   Lipid Profile: No results for input(s): "CHOL", "HDL", "LDLCALC", "TRIG", "CHOLHDL", "LDLDIRECT" in the last 72 hours. Thyroid Function Tests: No results for input(s): "TSH", "T4TOTAL", "FREET4", "T3FREE", "THYROIDAB" in the last 72 hours. Anemia Panel: No results for input(s): "VITAMINB12", "FOLATE", "FERRITIN", "TIBC", "IRON", "RETICCTPCT" in the last 72 hours. Sepsis  Labs: No results for input(s): "PROCALCITON", "LATICACIDVEN" in the last 168 hours.  Recent Results (from the past 240 hour(s))  SARS Coronavirus 2 by RT PCR (hospital order, performed in [facility] hospital lab) *cepheid single result test* Anterior Nasal Swab     Status: None   Collection Time: 01/07/23  1:20 PM   Specimen: Anterior Nasal Swab  Result Value Ref Range Status   SARS Coronavirus 2 by RT PCR NEGATIVE NEGATIVE Final    Comment: (NOTE) SARS-CoV-2 target nucleic acids are NOT DETECTED.  The SARS-CoV-2 RNA is generally detectable in upper and  lower respiratory specimens during the acute phase of infection. The lowest concentration of SARS-CoV-2 viral copies this assay can detect is 250 copies / mL. A negative result does not preclude SARS-CoV-2 infection and should not be used as the sole basis for treatment or other patient management decisions.  A negative result may occur with improper specimen collection / handling, submission of specimen other than nasopharyngeal swab, presence of viral mutation(s) within the areas targeted by this assay, and inadequate number of viral copies (<250 copies / mL). A negative result must be combined with clinical observations, patient history, and epidemiological information.  Fact Sheet for Patients:   RoadLapTop.co.za  Fact Sheet for Healthcare Providers: http://kim-miller.com/  This test is not yet approved or  cleared by the [place] FDA and has been authorized for detection and/or diagnosis of SARS-CoV-2 by FDA under an Emergency Use Authorization (EUA).  This EUA will remain in effect (meaning this test can be used) for the duration of the COVID-19 declaration under Section 564(b)(1) of the Act, 21 U.S.C. section 360bbb-3(b)(1), unless the authorization is terminated or revoked sooner.  Performed at [facility], [address] [place], [place] [address]      Radiology Studies: DG Chest Portable 1 View  Result Date: 01/07/2023 CLINICAL DATA:  Cough, weakness. EXAM: PORTABLE CHEST 1 VIEW COMPARISON:  December 10, 2022.  February 20, 2022. FINDINGS: The heart size and mediastinal contours are within normal limits. Stable reticulonodular densities are noted throughout both lungs, most prominently seen in the left lung base. Severe degenerative changes are seen involving the left glenohumeral joint. IMPRESSION: Stable reticulonodular densities are noted throughout both lungs, most prominently seen in left lower lobe. This is concerning for chronic scarring or possibly atypical inflammation. Aortic Atherosclerosis (ICD10-I70.0). Electronically Signed   By: Lupita Raider M.D.   On: 01/07/2023 15:13    Scheduled Meds:  acetaminophen  650 mg Oral Q6H WA   budesonide  0.25 mg Nebulization BID   enoxaparin (LOVENOX) injection  30 mg Subcutaneous Q24H   guaiFENesin  1,200 mg Oral BID   hydrALAZINE  50 mg Oral TID   insulin aspart  0-5 Units Subcutaneous QHS   insulin aspart  0-9 Units Subcutaneous TID WC   ipratropium-albuterol  3 mL Nebulization TID   irbesartan  300 mg Oral Daily   metoprolol tartrate  50 mg Oral BID   pantoprazole  40 mg Oral Daily   predniSONE  10 mg Oral Q breakfast   Continuous Infusions:   LOS: 0 days   Marguerita Merles, DO [facility] Available via Epic secure chat 7am-7pm After these hours, please refer to coverage provider listed on [email or web address] 01/08/2023, 7:40 PM

## 2023-01-09 DIAGNOSIS — J479 Bronchiectasis, uncomplicated: Secondary | ICD-10-CM | POA: Diagnosis not present

## 2023-01-09 DIAGNOSIS — R531 Weakness: Secondary | ICD-10-CM | POA: Diagnosis not present

## 2023-01-09 DIAGNOSIS — I5032 Chronic diastolic (congestive) heart failure: Secondary | ICD-10-CM | POA: Diagnosis not present

## 2023-01-09 DIAGNOSIS — M545 Low back pain, unspecified: Secondary | ICD-10-CM | POA: Diagnosis not present

## 2023-01-09 LAB — COMPREHENSIVE METABOLIC PANEL
ALT: 11 U/L (ref 0–44)
AST: 12 U/L — ABNORMAL LOW (ref 15–41)
Albumin: 2.6 g/dL — ABNORMAL LOW (ref 3.5–5.0)
Alkaline Phosphatase: 57 U/L (ref 38–126)
Anion gap: 9 (ref 5–15)
BUN: 34 mg/dL — ABNORMAL HIGH (ref 8–23)
CO2: 25 mmol/L (ref 22–32)
Calcium: 8.5 mg/dL — ABNORMAL LOW (ref 8.9–10.3)
Chloride: 98 mmol/L (ref 98–111)
Creatinine, Ser: 0.87 mg/dL (ref 0.44–1.00)
GFR, Estimated: >60 mL/min (ref >60)
Glucose, Bld: 143 mg/dL — ABNORMAL HIGH (ref 70–99)
Potassium: 4.3 mmol/L (ref 3.5–5.1)
Sodium: 132 mmol/L — ABNORMAL LOW (ref 135–145)
Total Bilirubin: 0.6 mg/dL (ref 0.3–1.2)
Total Protein: 5.1 g/dL — ABNORMAL LOW (ref 6.5–8.1)

## 2023-01-09 LAB — CBC WITH DIFFERENTIAL/PLATELET
Abs Immature Granulocytes: 0.03 10*3/uL (ref 0.00–0.07)
Basophils Absolute: 0 10*3/uL (ref 0.0–0.1)
Basophils Relative: 0 %
Eosinophils Absolute: 0 10*3/uL (ref 0.0–0.5)
Eosinophils Relative: 0 %
HCT: 29.7 % — ABNORMAL LOW (ref 36.0–46.0)
Hemoglobin: 9.5 g/dL — ABNORMAL LOW (ref 12.0–15.0)
Immature Granulocytes: 0 %
Lymphocytes Relative: 8 %
Lymphs Abs: 0.5 10*3/uL — ABNORMAL LOW (ref 0.7–4.0)
MCH: 29.1 pg (ref 26.0–34.0)
MCHC: 32 g/dL (ref 30.0–36.0)
MCV: 90.8 fL (ref 80.0–100.0)
Monocytes Absolute: 0.6 10*3/uL (ref 0.1–1.0)
Monocytes Relative: 8 %
Neutro Abs: 5.7 10*3/uL (ref 1.7–7.7)
Neutrophils Relative %: 84 %
Platelets: 219 10*3/uL (ref 150–400)
RBC: 3.27 MIL/uL — ABNORMAL LOW (ref 3.87–5.11)
RDW: 12.4 % (ref 11.5–15.5)
WBC: 6.8 10*3/uL (ref 4.0–10.5)
nRBC: 0 % (ref 0.0–0.2)

## 2023-01-09 LAB — GLUCOSE, CAPILLARY
Glucose-Capillary: 147 mg/dL — ABNORMAL HIGH (ref 70–99)
Glucose-Capillary: 227 mg/dL — ABNORMAL HIGH (ref 70–99)
Glucose-Capillary: 267 mg/dL — ABNORMAL HIGH (ref 70–99)
Glucose-Capillary: 204 mg/dL — ABNORMAL HIGH (ref 70–99)

## 2023-01-09 LAB — MAGNESIUM: Magnesium: 1.6 mg/dL — ABNORMAL LOW (ref 1.7–2.4)

## 2023-01-09 LAB — PHOSPHORUS: Phosphorus: 2.7 mg/dL (ref 2.5–4.6)

## 2023-01-09 MED ORDER — MAGNESIUM SULFATE 2 GM/50ML IV SOLN
2.0000 g | Freq: Once | INTRAVENOUS | Status: AC
  Administered 2023-01-09: 2 g via INTRAVENOUS
  Filled 2023-01-09: qty 50

## 2023-01-09 MED ORDER — GUAIFENESIN-DM 100-10 MG/5ML PO SYRP
5.0000 mL | ORAL_SOLUTION | ORAL | Status: DC | PRN
  Administered 2023-01-09: 5 mL via ORAL
  Filled 2023-01-09: qty 10

## 2023-01-09 MED ORDER — IPRATROPIUM-ALBUTEROL 0.5-2.5 (3) MG/3ML IN SOLN
3.0000 mL | Freq: Two times a day (BID) | RESPIRATORY_TRACT | Status: DC
  Administered 2023-01-10: 3 mL via RESPIRATORY_TRACT
  Filled 2023-01-09: qty 3

## 2023-01-09 NOTE — Progress Notes (Signed)
Occupational Therapy Treatment Patient Details Name: Katie Alvarado MRN: [id number] DOB: November 14, 1925 Today's Date: 01/09/2023   History of present illness 89 yr old female brought to the hospital with weakness and diffuse arthralgia. PMH: arthritis, skin CA, ankle fracture surgery, COPD, bronchiectasis, chronic hypoxic respiratory failure on O2, diastolic heart failure, CKD 3, DM II, DDD, impaired vision, arthralgia   OT comments  Pt, her caregiver Peyton Najjar, and pt's friend were present during the session. Pt indicated she plans to return home with caregiver support tomorrow. As such, OT addressed any questions or concerns they had regarding the pt's care needs in the home. OT subsequently instructed them on simple ROM and therapeutic exercises of B UE and B LE at bed level to facilitate improved joint flexibility and muscle strengthening, the importance of regularly positioning the pt in bed to decrease the risk for skin break down/pressure sores, how to appropriately position the pt, use of built-up utensils to facilitate improved ability of the pt to self-feed given significant arthritic changes of her hands (built up foam utensil issued), and general safety during self-care tasks. Pt's caregiver Peyton Najjar and pt's friend stated understanding.      If plan is discharge home, recommend the following:  Help with stairs or ramp for entrance;Assist for transportation;Assistance with cooking/housework;Direct supervision/assist for medications management;A lot of help with bathing/dressing/bathroom;A lot of help with walking and/or transfers   Equipment Recommendations  Hospital bed    Recommendations for Other Services      Precautions / Restrictions Precautions Precautions: Fall Restrictions Weight Bearing Restrictions: No Other Position/Activity Restrictions: O2 dependent at baseline. Vision impaired              ADL either performed or assessed with clinical judgement   ADL Overall ADL's :  Needs assistance/impaired Eating/Feeding: Set up;Bed level Eating/Feeding Details (indicate cue type and reason): Requires assist to cut food, open packets and remove lids, due to pain, arthritic changes of hands, and impaired fine motor coordination. Grooming: Minimal assistance;Bed level Grooming Details (indicate cue type and reason): based on clinical judgement         Upper Body Dressing : Moderate assistance;Bed level Upper Body Dressing Details (indicate cue type and reason): simulated Lower Body Dressing: Maximal assistance;Bed level Lower Body Dressing Details (indicate cue type and reason): based on clinical judgement               General ADL Comments: Pt, her caregiver Peyton Najjar, and pt's friend were present during the session. Pt indicated she plans to return home with caregiver support tomorrow. As such, OT addressed any questions or concerns they had regarding the pt's care needs in the home. OT subsequently instructed them on simple ROM and therapeutic exercises of B UE and B LE at bed level to facilitate improved joint flexibility and muscle strengthening, the importance of regularly positioning the pt in bed to decrease the risk for skin break down/pressure sores, how to appropriately position the pt, use of built-up utensils to facilitate improved ability of the pt to self-feed given significant arthritic changes of her hands (built up foam utensil issued), and general safety during self-care tasks. Pt's caregiver Peyton Najjar and pt's friend stated understanding.     Vision Baseline Vision/History: 1 Wears glasses            Cognition Arousal: Alert Behavior During Therapy: WFL for tasks assessed/performed Overall Cognitive Status: Within Functional Limits for tasks assessed  Pertinent Vitals/ Pain       Pain Assessment Pain Location: pt reports chronic generalized pain, especially in hands due to arthritis Pain Intervention(s): Limited  activity within patient's tolerance, Monitored during session         Frequency  Min 1X/week        Progress Toward Goals  OT Goals(current goals can now be found in the care plan section)     Acute Rehab OT Goals OT Goal Formulation: With patient Time For Goal Achievement: 01/22/23 Potential to Achieve Goals: Fair  Plan         AM-PAC OT "6 Clicks" Daily Activity     Outcome Measure   Help from another person eating meals?: A Little Help from another person taking care of personal grooming?: A Little Help from another person toileting, which includes using toliet, bedpan, or urinal?: A Lot Help from another person bathing (including washing, rinsing, drying)?: A Lot Help from another person to put on and taking off regular upper body clothing?: A Lot Help from another person to put on and taking off regular lower body clothing?: A Lot 6 Click Score: 14    End of Session Equipment Utilized During Treatment: Oxygen  OT Visit Diagnosis: Muscle weakness (generalized) (M62.81);Pain;Other abnormalities of gait and mobility (R26.89)   Activity Tolerance Patient tolerated treatment well   Patient Left in bed;with call bell/phone within reach;with family/visitor present   Nurse Communication Other (comment)        Time: 1700-1716 OT Time Calculation (min): 16 min  Charges: OT General Charges $OT Visit: 1 Visit OT Treatments $Self Care/Home Management : 8-22 mins    Reuben Likes, OTR/L 01/09/2023, 5:44 PM

## 2023-01-09 NOTE — Progress Notes (Signed)
Daily Progress Note   Patient Name: Katie Alvarado       Date: 01/09/2023 DOB: 1925/10/06  Age: 89 y.o. MRN#: [id number] Attending Physician: Merlene Laughter, DO Primary Care Physician: Daisy Floro, MD Admit Date: 01/07/2023  Reason for Consultation/Follow-up: Establishing goals of care  Subjective: Resting comfortably in bed  Length of Stay: 1  Current Medications: Scheduled Meds:   acetaminophen  650 mg Oral Q6H WA   budesonide  0.25 mg Nebulization BID   enoxaparin (LOVENOX) injection  30 mg Subcutaneous Q24H   guaiFENesin  1,200 mg Oral BID   hydrALAZINE  50 mg Oral TID   insulin aspart  0-5 Units Subcutaneous QHS   insulin aspart  0-9 Units Subcutaneous TID WC   ipratropium-albuterol  3 mL Nebulization TID   irbesartan  300 mg Oral Daily   metoprolol tartrate  50 mg Oral BID   pantoprazole  40 mg Oral Daily   predniSONE  10 mg Oral Q breakfast    Continuous Infusions:  magnesium sulfate bolus IVPB 2 g (01/09/23 0852)    PRN Meds: ALPRAZolam, guaiFENesin-dextromethorphan, HYDROmorphone (DILAUDID) injection, ipratropium-albuterol, ondansetron **OR** ondansetron (ZOFRAN) IV, oxyCODONE  Physical Exam         Physical Exam Elderly lady resting in bed A little hard of hearing No acute distress Has arthritic changes upper extremities and hands Regular work of breathing Crackles bilaterally Awake alert oriented answers all questions appropriately Mood and affect within normal limits Vital Signs: BP (!) 146/67 (BP Location: Left Arm)   Pulse 78   Temp (!) 97.5 F (36.4 C) (Oral)   Resp 17   SpO2 98%  SpO2: SpO2: 98 % O2 Device: O2 Device: Nasal Cannula O2 Flow Rate: O2 Flow Rate (L/min): 2 L/min  Intake/output summary:  Intake/Output Summary (Last 24  hours) at 01/09/2023 0925 Last data filed at 01/09/2023 0600 Gross per 24 hour  Intake 1828.7 ml  Output 2200 ml  Net -371.3 ml   LBM: Last BM Date : 01/07/23 Baseline Weight:   Most recent weight:         Palliative Assessment/Data:      Patient Active Problem List   Diagnosis Date Noted   Diffuse arthralgia 01/07/2023   Chronic respiratory failure with hypoxia (HCC) 12/10/2022   HCAP (healthcare-associated pneumonia) 02/20/2022  Type 2 diabetes mellitus (HCC) 02/20/2022   Stage 3a chronic kidney disease (CKD) (HCC) 02/20/2022   Mild protein malnutrition (HCC) 02/20/2022   Acute bronchitis 08/30/2021   Acute respiratory failure with hypoxia (HCC) 08/30/2021   Pancreatic lesion 08/30/2021   Left groin pain 08/30/2021   2019 novel coronavirus disease (COVID-19) 04/23/2021   Palpitations 02/23/2021   Malnutrition of moderate degree 01/03/2021   COPD with acute exacerbation (HCC) 01/02/2021   Chronic diastolic CHF (congestive heart failure) (HCC) 01/02/2021   Anxiety 09/14/2020   Bronchiolectasis (HCC) 09/14/2020   Chronic anxiety 09/14/2020   Drug-induced myopathy 09/14/2020   Edema 09/14/2020   Gout 09/14/2020   History of pneumonia 09/14/2020   History of squamous cell carcinoma 09/14/2020   Hyperlipidemia 09/14/2020   Hypertensive heart disease without congestive heart failure 09/14/2020   Impairment of balance 09/14/2020   Iron deficiency anemia 09/14/2020   Legal blindness, as defined in [place] 09/14/2020   Lumbar radiculopathy 09/14/2020   Lumbar spondylosis 09/14/2020   Microalbuminuria 09/14/2020   Osteoarthritis of knee 09/14/2020   Pseudogout 09/14/2020   Solitary pulmonary nodule 09/14/2020   Vitamin D deficiency 09/14/2020   Pain in right hand 08/08/2020   EKG abnormality 12/24/2019   Hypertensive urgency 12/24/2019   Acute hyponatremia 12/23/2019   Neck pain 12/13/2019   AKI (acute kidney injury) (HCC) 01/10/2019   Thrombocytosis 12/25/2018    Degeneration of lumbar intervertebral disc 10/28/2018   Left-sided chest wall pain 06/01/2018   Chronic back pain 05/05/2018   Low back pain radiating down leg 05/05/2018   Pneumonia of both lungs due to infectious organism 04/28/2018   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   Allergic urticaria 03/05/2016   Gastritis 03/05/2016   Chronic rhinitis 01/09/2016   Atypical mycobacterial infection of lung (HCC)    Normocytic anemia 07/18/2015   Bronchiectasis without complication (HCC) 04/25/2015   Renal artery stenosis (HCC) 04/25/2015   Uncontrolled hypertension 04/25/2015   Lower extremity pain, bilateral 08/04/2012   Klebsiella infection 08/01/2012   Uncontrolled NIDDM-2 with hyperglycemia 07/29/2012   Generalized weakness 07/29/2012   Hyponatremia 04/11/2011   Leucocytosis 04/11/2011   BRONCHIECTASIS, + MAIC 03/25/2010   DIZZINESS 02/02/2010   MUSCULOSKELETAL PAIN 09/25/2007   INSOMNIA 05/20/2007   ACUTE NASOPHARYNGITIS 04/20/2007   Acute exacerbation of bronchiectasis (HCC) 04/20/2007   GERD without esophagitis 04/20/2007    Palliative Care Assessment & Plan   Patient Profile:    Assessment:  89 year old lady who lives at home by herself in [place], [place].  She has two children - who live out of town in [place] and in [place].  She also has a granddaughter who lives in [place].  At baseline, she uses a wheelchair.  She has an aide who comes during the daytime and has a longtime friend Mr. Retia Passe who is also available for assistance. Patient has past medical history significant for COPD, bronchiectasis, MAI, chronic hypoxic respiratory failure is on 2.5 L of supplemental oxygen via nasal cannula, diastolic CHF, stage III CKD, diabetes, impaired vision diminished hearing and diffuse arthralgias.  She is wheelchair dependent and has generalized weakness and diffuse arthralgias. Patient presented to the emergency department because of generalized weakness  and diffuse lower back pain and pain in her joints. Patient has since been admitted to hospital medicine service for generalized weakness, ambulatory dysfunction, diffuse arthralgias in the setting of bilateral chronic lower extremity pain and chronic back pain. PT OT have been consulted, scheduled Tylenol and IV hydromorphone have been  initiated. Palliative consult for goals of care discussions has been requested.  Recommendations/Plan:  Chart reviewed, agree with TOC input, anticipate home with hospice on discharge.     Code Status:    Code Status Orders  (From admission, onward)           Start     Ordered   01/07/23 1623  Do not attempt resuscitation (DNR)- Limited -Do Not Intubate (DNI)  Continuous       Question Answer Comment  If pulseless and not breathing No CPR or chest compressions.   In Pre-Arrest Conditions (Patient Is Breathing and Has A Pulse) Do not intubate. Provide all appropriate non-invasive medical interventions. Avoid ICU transfer unless indicated or required.   Consent: Discussion documented in EHR or advanced directives reviewed      01/07/23 1623           Code Status History     Date Active Date Inactive Code Status Order ID Comments User Context   02/20/2022 0850 02/23/2022 2126 DNR [id number]  Bobette Mo, MD ED   02/07/[id number] 02/18/2022 2019 DNR [id number]  Lanae Boast, MD ED   02/06/2022 2258 02/07/2022 0742 Full Code [id number]  Darlin Drop, DO ED   08/30/2021 0341 09/01/2021 1605 DNR [id number]  Eduard Clos, MD ED   08/30/2021 0328 08/30/2021 0341 DNR [id number]  Eduard Clos, MD ED   04/24/2021 1237 04/26/2021 0328 DNR [id number]  Darlin Drop, DO Inpatient   04/24/2021 0101 04/24/2021 1237 Full Code [id number]  Anselm Jungling, DO Inpatient   01/02/2021 0217 01/05/2021 2122 DNR [id number]  Marinda Elk, MD Inpatient   12/24/2019 0434 12/26/2019 2057 DNR [id number]  John Giovanni, MD Inpatient   01/10/2019 1156 01/10/2019 2035 DNR  [id number]  Eddie North, MD Inpatient   01/07/2019 2241 01/10/2019 1156 Full Code [id number]  Pearson Grippe, MD Inpatient   05/05/2018 0022 05/08/2018 1743 Full Code [id number]  Eduard Clos, MD ED   07/18/2015 0511 07/20/2015 1804 Full Code [id number]  Clydie Braun, MD ED   09/21/2014 0852 09/22/2014 0325 Full Code [id number]  Paulina Fusi, MD HOV   02/15/2013 1149 02/16/2013 1727 Full Code [id number]  Clydene Fake, MD Inpatient   08/03/2012 1814 08/05/2012 1734 DNR [id number]  Zannie Cove, MD ED   07/29/2012 1745 08/01/2012 1300 Full Code [id number]  Osvaldo Shipper, MD Inpatient      Advance Directive Documentation    Flowsheet Row Most Recent Value  Type of Advance Directive Healthcare Power of Attorney  Pre-existing out of facility DNR order (yellow form or pink MOST form) --  "MOST" Form in Place? --       Prognosis:  < 6 months  Discharge Planning: Home with Hospice  Care plan was discussed with  IDT  Thank you for allowing the Palliative Medicine Team to assist in the care of this patient. Low MDM    Greater than 50%  of this time was spent counseling and coordinating care related to the above assessment and plan.  Rosalin Hawking, MD  Please contact Palliative Medicine Team phone at [phone] for questions and concerns.

## 2023-01-09 NOTE — Progress Notes (Signed)
PROGRESS NOTE    Katie Alvarado  ZOX:[id number] DOB: [birth year] DOA: 01/07/2023 PCP: Daisy Floro, MD   Brief Narrative:  The patient is a 89 year old Caucasian elderly female with past medical history is significant for but limited to COPD, bronchiectasis, MAI, chronic hypoxic respiratory failure on 2 L supplemental oxygen via nasal cannula, chronic diastolic CHF, CKD stage IIIa, diabetes mellitus type 2, degenerative disc disease, impaired vision, diminished hearing, diffuse arthralgia and wheelchair dependence as well as other comorbidities who presents with generalized weakness and diffuse arthralgia.  She reports progressive generalized weakness for 4 to 5 weeks and has gotten worse for the last 4 to 5 days.  She also reports lower back pain and diffuse leg pain in her joints although she denies any change in her leg joint pain.  Given her pain being a 10 out of 10 with her body aches she presented to the ED.  Her pain improved after she received IV morphine in the ED and she currently denies any fall or trauma.  Denies any leg swelling.  States that she has chronic shortness of breath and a cough that is unchanged from baseline.  Denies any urinary tract and symptoms and currently lives by herself and has an aide during the day and a friend that stays with her during the evening hours.  In the ED basic blood work was done and CK was normal.  COVID was nonreactive and a chest x-ray was done and showed stable reticulonodular densities throughout the lungs most prominent in the left lower lung.  Patient was given ceftriaxone, Zithromax, prednisone, morphine, Zofran and DuoNeb and the hospitalist was asked to admit this patient for diffuse orthopnea, hyponatremia and possible lung infection.  PT OT evaluated and PT recommended SNF for patient is interested in going home with hospice services.  Hospice liaison met with the patient and family and patient has elected for hospice at home and equipment  will be delivered tomorrow and patient will be discharged after equipment is in place.  She states that her pain is improved and weakness is also improving.  Assessment and Plan:  Generalized weakness/ambulatory dysfunction/diffuse arthralgia/bilateral lower extremity pain/chronic back pain -Acute on chronic bilateral lower extremity pain and progressive weakness.  She is wheelchair-bound at baseline.  Lives by herself.  -She has a personal aide during the day and friend is helping her so she can stay by herself.  She is interested in hospice. -Check CRP, ESR and uric acid -Trial of gentle IV fluid now stopped -Scheduled Tylenol with low-dose prednisone and as needed IV Dilaudid for pain control -PT/OT eval recommending SNF however patient has a caregiver at home and -Palliative consulted and patient is interested in discussing about hospice philosophy and meeting with the hospice liaison for home with home hospice services -Referral has been placed to [facility] and hospice liaison is to meet with the patient and discussion was had with family as well and patient verbalized understanding of the information given about the hospice philosophy and wishes to enroll in hospice services at home after discharge.  They are requesting a hospital bed and over bed table and oxygen if it needs to be switched due to the supply company.  Equipment to be delivered and plan is to discharge the patient home with hospice in the morning once equipment has been delivered -She is on scheduled Tylenol and IV hydromorphone initiated while hospitalized and will go home with adequate pain control   Chronic COPD/bronchiectasis/MAIC/chronic hypoxic RF:  -  Reports using 2.5 L oxygen at baseline.  CXR with stable chronic changes.  Doubt acute infection. -Continue home oxygen -Continue nebulized medications with budesonide 0.25 mg twice daily, DuoNeb 3 mL every 6 as needed for wheezing or shortness of breath, DuoNeb 3  mL neb 3 times daily and with guaifenesin but will increase from 600 p.o. twice daily as needed for cough to 1200 mg p.o. twice daily scheduled -Add flutter valve and incentive spirometry   Chronic Diastolic CHF -Appears euvolemic.   -Closely monitored respiratory and fluid status while she was on IV fluid -IVF now stopped and will continue Strict I's and O's and Daily Weights;  Intake/Output Summary (Last 24 hours) at 01/09/2023 1427 Last data filed at 01/09/2023 0600 Gross per 24 hour  Intake 1488.7 ml  Output 1900 ml  Net -411.3 ml  -Continue to Monitor for S/Sx of Volume Overload   Essential Hypertension -Normotensive -Continue home meds with p.o. Hydralazine 50 mg 3 times daily and metoprolol tartrate 50 mg p.o. twice daily and irbesartan 300 mg p.o. daily -Continue to Monitor BP per Protocol -Last BP Reading was 138/66   DM-2 -A1c 6.8% in 2023. Now HbA1c is now 7.7 -Continue with sensitive NovoLog sliding scale insulin before meals and at bedtime -Continued to monitor CBGs per protocol and glucoses -Glucose Trend: Recent Labs  Lab 12/10/22 1616 01/07/23 1320 01/08/23 0350 01/09/23 0415  GLUCOSE 111* 153* 309* 143*  -CBG Trend: Recent Labs  Lab 01/07/23 2343 01/08/23 0753 01/08/23 1144 01/08/23 1655 01/08/23 2203 01/09/23 0805 01/09/23 1208  GLUCAP 277* 223* 252* 265* 259* 147* 227*   AKI on CKD Stage 3a, improved -BUN/Cr Trend: Recent Labs  Lab 12/10/22 1616 01/07/23 1320 01/08/23 0350 01/09/23 0415  BUN 49* 37* 43* 34*  CREATININE 1.15 1.09* 1.32* 0.87  -Avoid Nephrotoxic Medications, Contrast Dyes, Hypotension and Dehydration to Ensure Adequate Renal Perfusion and will need to Renally Adjust Meds -Continue to Monitor and Trend Renal Function carefully and repeat CMP in the AM   Normocytic Anemia -Hgb/Hct Trend: Recent Labs  Lab 12/10/22 1616 01/07/23 1320 01/08/23 0350 01/09/23 0415  HGB 11.3* 10.4* 9.9* 9.5*  HCT 34.6* 31.5* 31.3* 29.7*  MCV  89.1 89.5 92.1 90.8  -Will not Check Anemia Panel in the AM now that patient is going to go home with Home Hospice tomorrow when equipment is delivered -Continue to Monitor for S/Sx of Bleeding; No overt bleeding noted -Repeat CBC within 1 week if desired   Hypomagnesemia -Patient's Mag Level Trend: Recent Labs  Lab 01/08/23 0350 01/09/23 0415  MG 1.7 1.6*  -Replete with IV Mag Sulfate 2 grams -Continue to Monitor and Replete as Necessary -Repeat Mag within 1 week if desired   Hyponatremia -Na+ Trend: Recent Labs  Lab 12/10/22 1616 01/07/23 1320 01/08/23 0350 01/09/23 0415  NA 133* 128* 128* 132*  -Was given a trial of NS and now stopped -Continue to Monitor and Trend and repeat in the outpatient setting if wanted and necessary   GERD/GI Prophylaxis -Continue with PPI with Pantoprazole 40 mg p.o. daily  Hypoalbuminemia -Patient's Albumin Trend: Recent Labs  Lab 12/10/22 1616 01/07/23 1320 01/08/23 0350 01/09/23 0415  ALBUMIN 4.1 2.9* 2.8* 2.6*  -Continue to Monitor and Trend and repeat CMP within 1 week   DVT prophylaxis: enoxaparin (LOVENOX) injection 30 mg Start: 01/07/23 2200    Code Status: Limited: Do not attempt resuscitation (DNR) -DNR-LIMITED -Do Not Intubate/DNI  Family Communication: No family currently at bedside but was updated by  the hospice liaison  Disposition Plan:  Level of care: Med-Surg Status is: Inpatient Remains inpatient appropriate because: Needs for hospice equipment to be delivered prior to discharging home   Consultants:  Palliative Care medicine [facility]  Procedures:  As delineated as above  Antimicrobials:  Anti-infectives (From admission, onward)    Start     Dose/Rate Route Frequency Ordered Stop   01/07/23 1530  cefTRIAXone (ROCEPHIN) 1 g in sodium chloride 0.9 % 100 mL IVPB        1 g 200 mL/hr over 30 Minutes Intravenous  Once 01/07/23 1526 01/07/23 1726   01/07/23 1530  azithromycin (ZITHROMAX) tablet 500  mg        500 mg Oral  Once 01/07/23 1526 01/07/23 1601       Subjective: Seen and examined at bedside and she was resting and wanting to speak with the hospice team.  She denies any complaints and states her pain is better.  No nausea or vomiting.  No other concerns or complaints at this time.  Objective: Vitals:   01/09/23 0447 01/09/23 0832 01/09/23 1416 01/09/23 1419  BP: (!) 146/67   138/66  Pulse: 78   78  Resp: 17   16  Temp: (!) 97.5 F (36.4 C)     TempSrc: Oral     SpO2: 99% 98% 98% 100%    Intake/Output Summary (Last 24 hours) at 01/09/2023 1432 Last data filed at 01/09/2023 0600 Gross per 24 hour  Intake 1488.7 ml  Output 1900 ml  Net -411.3 ml   There were no vitals filed for this visit.  Examination: Physical Exam:  Constitutional: Thin elderly Caucasian female who appears calm and in no acute distress Respiratory: Diminished to auscultation bilaterally with some coarse breath sounds, no wheezing, rales, rhonchi or crackles. Normal respiratory effort and patient is not tachypenic. No accessory muscle use.  Unlabored breathing Cardiovascular: RRR, no murmurs / rubs / gallops. S1 and S2 auscultated. No extremity edema.  Abdomen: Soft, non-tender, non-distended. Bowel sounds positive.  GU: Deferred. Musculoskeletal: No clubbing / cyanosis of digits/nails. No joint deformity upper and lower extremities.  Skin: No rashes, lesions, ulcers on limited skin evaluation. No induration; Warm and dry.  Neurologic: CN 2-12 grossly intact with no focal deficits. Romberg sign cerebellar reflexes not assessed.  Psychiatric: Normal judgment and insight.  She was awake and alert and oriented  Data Reviewed: I have personally reviewed following labs and imaging studies  CBC: Recent Labs  Lab 01/07/23 1320 01/08/23 0350 01/09/23 0415  WBC 7.5 3.1* 6.8  NEUTROABS  --   --  5.7  HGB 10.4* 9.9* 9.5*  HCT 31.5* 31.3* 29.7*  MCV 89.5 92.1 90.8  PLT 199 184 219   Basic  Metabolic Panel: Recent Labs  Lab 01/07/23 1320 01/08/23 0350 01/09/23 0415  NA 128* 128* 132*  K 4.0 4.4 4.3  CL 88* 92* 98  CO2 29 25 25   GLUCOSE 153* 309* 143*  BUN 37* 43* 34*  CREATININE 1.09* 1.32* 0.87  CALCIUM 8.5* 8.4* 8.5*  MG  --  1.7 1.6*  PHOS  --  3.5 2.7   GFR: CrCl cannot be calculated (Unknown ideal weight.). Liver Function Tests: Recent Labs  Lab 01/07/23 1320 01/08/23 0350 01/09/23 0415  AST 18  --  12*  ALT 12  --  11  ALKPHOS 65  --  57  BILITOT 0.9  --  0.6  PROT 6.0*  --  5.1*  ALBUMIN 2.9* 2.8*  2.6*   Recent Labs  Lab 01/07/23 1320  LIPASE 34   No results for input(s): "AMMONIA" in the last 168 hours. Coagulation Profile: No results for input(s): "INR", "PROTIME" in the last 168 hours. Cardiac Enzymes: Recent Labs  Lab 01/07/23 1320  CKTOTAL 66   BNP (last 3 results) No results for input(s): "PROBNP" in the last 8760 hours. HbA1C: Recent Labs    01/07/23 1320  HGBA1C 7.7*   CBG: Recent Labs  Lab 01/08/23 1144 01/08/23 1655 01/08/23 2203 01/09/23 0805 01/09/23 1208  GLUCAP 252* 265* 259* 147* 227*   Lipid Profile: No results for input(s): "CHOL", "HDL", "LDLCALC", "TRIG", "CHOLHDL", "LDLDIRECT" in the last 72 hours. Thyroid Function Tests: No results for input(s): "TSH", "T4TOTAL", "FREET4", "T3FREE", "THYROIDAB" in the last 72 hours. Anemia Panel: No results for input(s): "VITAMINB12", "FOLATE", "FERRITIN", "TIBC", "IRON", "RETICCTPCT" in the last 72 hours. Sepsis Labs: No results for input(s): "PROCALCITON", "LATICACIDVEN" in the last 168 hours.  Recent Results (from the past 240 hour(s))  SARS Coronavirus 2 by RT PCR (hospital order, performed in [facility] hospital lab) *cepheid single result test* Anterior Nasal Swab     Status: None   Collection Time: 01/07/23  1:20 PM   Specimen: Anterior Nasal Swab  Result Value Ref Range Status   SARS Coronavirus 2 by RT PCR NEGATIVE NEGATIVE Final    Comment:  (NOTE) SARS-CoV-2 target nucleic acids are NOT DETECTED.  The SARS-CoV-2 RNA is generally detectable in upper and lower respiratory specimens during the acute phase of infection. The lowest concentration of SARS-CoV-2 viral copies this assay can detect is 250 copies / mL. A negative result does not preclude SARS-CoV-2 infection and should not be used as the sole basis for treatment or other patient management decisions.  A negative result may occur with improper specimen collection / handling, submission of specimen other than nasopharyngeal swab, presence of viral mutation(s) within the areas targeted by this assay, and inadequate number of viral copies (<250 copies / mL). A negative result must be combined with clinical observations, patient history, and epidemiological information.  Fact Sheet for Patients:   RoadLapTop.co.za  Fact Sheet for Healthcare Providers: http://kim-miller.com/  This test is not yet approved or  cleared by the [place] FDA and has been authorized for detection and/or diagnosis of SARS-CoV-2 by FDA under an Emergency Use Authorization (EUA).  This EUA will remain in effect (meaning this test can be used) for the duration of the COVID-19 declaration under Section 564(b)(1) of the Act, 21 U.S.C. section 360bbb-3(b)(1), unless the authorization is terminated or revoked sooner.  Performed at [facility], [address], [place], [place] [address]     Radiology Studies: No results found.  Scheduled Meds:  acetaminophen  650 mg Oral Q6H WA   budesonide  0.25 mg Nebulization BID   enoxaparin (LOVENOX) injection  30 mg Subcutaneous Q24H   guaiFENesin  1,200 mg Oral BID   hydrALAZINE  50 mg Oral TID   insulin aspart  0-5 Units Subcutaneous QHS   insulin aspart  0-9 Units Subcutaneous TID WC   ipratropium-albuterol  3 mL Nebulization TID   irbesartan  300 mg Oral Daily   metoprolol  tartrate  50 mg Oral BID   pantoprazole  40 mg Oral Daily   predniSONE  10 mg Oral Q breakfast   Continuous Infusions:   LOS: 1 day   Marguerita Merles, DO [facility] Available via Epic secure chat 7am-7pm After these hours, please  refer to coverage provider listed on [email or web address] 01/09/2023, 2:32 PM

## 2023-01-09 NOTE — Progress Notes (Signed)
[facility] Liaison Note  Received request from [place], Transitions of Care Manager, for hospice services at home after discharge. Spoke with son, patient, and friends to initiate education related to hospice philosophy, services, and team approach to care. Patient/family verbalized understanding of information given. Per discussion, the plan is for discharge home, unsure of date.   DME needs discussed. Patient has Oxygen, electric wheelchair, a walker, bedside commode and a shower chair in the home. Patient/family requests the following equipment for delivery; hospital bed, overbed table, and oxygen if needs to be switched out due to supplying company. The address has been verified and is correct in the chart. Peyton Najjar or Nadara Mode is the family contact to arrange time of equipment delivery.  Please send signed and completed DNR home with patient/family. Please provide prescriptions at discharge as needed to ensure ongoing symptom management.  [facility] information and contact numbers given to patient and son.  Above information shared with Valentina Gu, Transitions of Care Manager. Please call with any questions or concerns.  Thank you for the opportunity to participate in this patient's care.   Glenna Fellows BSN, Charity fundraiser, [facility] [employer] [phone]

## 2023-01-09 NOTE — TOC Progression Note (Signed)
Transition of Care [facility]) - Progression Note    Patient Details  Name: Katie Alvarado MRN: [id number] Date of Birth: November 08, 1925  Transition of Care [facility]) CM/SW Contact  Amada Jupiter, LCSW Phone Number: 01/09/2023, 2:31 PM  Clinical Narrative:    Met with pt and family today who confirm they have met with [facility] liaison and plan is home with hospice services.  Per MD, pt should be medically ready for dc tomorrow.  Awaiting delivery of DME to the home.  Will need to transport home via [facility] once DME confirmed to have been delivered.   Expected Discharge Plan: Home w Hospice Care (vs. with HH follow up) Barriers to Discharge: Continued Medical Work up  Expected Discharge Plan and Services In-house Referral: Clinical Social Work     Living arrangements for the past 2 months: Single Family Home                                       Social Determinants of Health (SDOH) Interventions SDOH Screenings   Food Insecurity: No Food Insecurity (01/07/2023)  Housing: Low Risk  (01/07/2023)  Transportation Needs: No Transportation Needs (01/07/2023)  Utilities: Not At Risk (01/07/2023)  Tobacco Use: Low Risk  (01/07/2023)    Readmission Risk Interventions    02/18/2022   12:08 PM 02/14/2022   11:23 AM  Readmission Risk Prevention Plan  Transportation Screening Complete Complete  Medication Review Oceanographer) Complete Complete  PCP or Specialist appointment within 3-5 days of discharge Complete Complete  HRI or Home Care Consult Complete Complete  SW Recovery Care/Counseling Consult Complete Complete  Palliative Care Screening Not Applicable Not Applicable  Skilled Nursing Facility Complete Complete

## 2023-01-09 NOTE — Plan of Care (Signed)
Problem: Coping: Goal: Ability to adjust to condition or change in health will improve Outcome: Progressing   Problem: Education: Goal: Knowledge of General Education information will improve Description: Including pain rating scale, medication(s)/side effects and non-pharmacologic comfort measures Outcome: Progressing   Problem: Pain Managment: Goal: General experience of comfort will improve Outcome: Progressing   Problem: Safety: Goal: Ability to remain free from injury will improve Outcome: Progressing   Haydee Salter, RN 01/09/23 10:52 AM

## 2023-01-10 DIAGNOSIS — R531 Weakness: Secondary | ICD-10-CM | POA: Diagnosis not present

## 2023-01-10 DIAGNOSIS — I5032 Chronic diastolic (congestive) heart failure: Secondary | ICD-10-CM | POA: Diagnosis not present

## 2023-01-10 DIAGNOSIS — Z515 Encounter for palliative care: Secondary | ICD-10-CM

## 2023-01-10 DIAGNOSIS — J479 Bronchiectasis, uncomplicated: Secondary | ICD-10-CM | POA: Diagnosis not present

## 2023-01-10 DIAGNOSIS — M545 Low back pain, unspecified: Secondary | ICD-10-CM | POA: Diagnosis not present

## 2023-01-10 LAB — GLUCOSE, CAPILLARY
Glucose-Capillary: 168 mg/dL — ABNORMAL HIGH (ref 70–99)
Glucose-Capillary: 144 mg/dL — ABNORMAL HIGH (ref 70–99)
Glucose-Capillary: 250 mg/dL — ABNORMAL HIGH (ref 70–99)

## 2023-01-10 MED ORDER — OXYCODONE HCL 5 MG PO TABS: 5.0000 mg | ORAL_TABLET | Freq: Three times a day (TID) | ORAL | 0 refills | Status: DC | PRN

## 2023-01-10 MED ORDER — LIDOCAINE 5 % EX PTCH
1.0000 | MEDICATED_PATCH | CUTANEOUS | Status: DC
  Administered 2023-01-10: 1 via TRANSDERMAL
  Filled 2023-01-10: qty 1

## 2023-01-10 MED ORDER — ACETAMINOPHEN 325 MG PO TABS: 650.0000 mg | ORAL_TABLET | Freq: Four times a day (QID) | ORAL | 0 refills | Status: DC

## 2023-01-10 MED ORDER — ONDANSETRON HCL 4 MG PO TABS: 4.0000 mg | ORAL_TABLET | Freq: Four times a day (QID) | ORAL | 0 refills | Status: DC | PRN

## 2023-01-10 MED ORDER — PREDNISONE 10 MG PO TABS: 10.0000 mg | ORAL_TABLET | Freq: Every day | ORAL | 0 refills | Status: DC

## 2023-01-10 MED ORDER — LIDOCAINE 5 % EX PTCH: 1.0000 | MEDICATED_PATCH | CUTANEOUS | 0 refills | Status: DC

## 2023-01-10 NOTE — Discharge Summary (Signed)
Physician Discharge Summary   Patient: Katie Alvarado MRN: [id number] DOB: 31-Mar-1926  Admit date:     01/07/2023  Discharge date: 01/10/23  Discharge Physician: Marguerita Merles, DO   PCP: Daisy Floro, MD   Recommendations at discharge:  {Tip this will not be part of the note when signed- Example include specific recommendations for outpatient follow-up, pending tests to follow-up on. (Optional):26781}  ***  Discharge Diagnoses: Principal Problem:   Generalized weakness Active Problems:   Chronic diastolic CHF (congestive heart failure) (HCC)   BRONCHIECTASIS, + MAIC   Lower extremity pain, bilateral   Chronic back pain   Bronchiolectasis (HCC)   Impairment of balance   Stage 3a chronic kidney disease (CKD) (HCC)   Chronic respiratory failure with hypoxia (HCC)   Diffuse arthralgia   Hospice care patient   Palliative care patient  Resolved Problems:   * No resolved hospital problems. [facility] Course: The patient is a 89 year old Caucasian elderly female with past medical history is significant for but limited to COPD, bronchiectasis, MAI, chronic hypoxic respiratory failure on 2 L supplemental oxygen via nasal cannula, chronic diastolic CHF, CKD stage IIIa, diabetes mellitus type 2, degenerative disc disease, impaired vision, diminished hearing, diffuse arthralgia and wheelchair dependence as well as other comorbidities who presents with generalized weakness and diffuse arthralgia.  She reports progressive generalized weakness for 4 to 5 weeks and has gotten worse for the last 4 to 5 days.  She also reports lower back pain and diffuse leg pain in her joints although she denies any change in her leg joint pain.  Given her pain being a 10 out of 10 with her body aches she presented to the ED.  Her pain improved after she received IV morphine in the ED and she currently denies any fall or trauma.  Denies any leg swelling.  States that she has chronic shortness of breath and a  cough that is unchanged from baseline.  Denies any urinary tract and symptoms and currently lives by herself and has an aide during the day and a friend that stays with her during the evening hours.  In the ED basic blood work was done and CK was normal.  COVID was nonreactive and a chest x-ray was done and showed stable reticulonodular densities throughout the lungs most prominent in the left lower lung.  Patient was given ceftriaxone, Zithromax, prednisone, morphine, Zofran and DuoNeb and the hospitalist was asked to admit this patient for diffuse orthopnea, hyponatremia and possible lung infection.  PT OT evaluated and PT recommended SNF for patient is interested in going home with hospice services.  Hospice liaison met with the patient and family and patient has elected for hospice at home and equipment will be delivered tomorrow and patient will be discharged after equipment is in place.  She states that her pain is improved and weakness is also improving.  Assessment and Plan:  Generalized weakness/ambulatory dysfunction/diffuse arthralgia/bilateral lower extremity pain/chronic back pain -Acute on chronic bilateral lower extremity pain and progressive weakness.  She is wheelchair-bound at baseline.  Lives by herself.  -She has a personal aide during the day and friend is helping her so she can stay by herself.  She is interested in hospice. -Check CRP, ESR and uric acid -Trial of gentle IV fluid now stopped -Scheduled Tylenol with low-dose prednisone and as needed IV Dilaudid for pain control -PT/OT eval recommending SNF however patient has a caregiver at home and -Palliative consulted and patient is interested  in discussing about hospice philosophy and meeting with the hospice liaison for home with home hospice services -Referral has been placed to [facility] hospice and hospice liaison is to meet with the patient and discussion was had with family as well and patient verbalized understanding of  the information given about the hospice philosophy and wishes to enroll in hospice services at home after discharge.  They are requesting a hospital bed and over bed table and oxygen if it needs to be switched due to the supply company.  Equipment to be delivered and plan is to discharge the patient home with hospice in the morning once equipment has been delivered -She is on scheduled Tylenol and IV hydromorphone initiated while hospitalized and will go home with adequate pain control   Chronic COPD/bronchiectasis/MAIC/chronic hypoxic RF:  -Reports using 2.5 L oxygen at baseline.  CXR with stable chronic changes.  Doubt acute infection. -Continue home oxygen -Continue nebulized medications with budesonide 0.25 mg twice daily, DuoNeb 3 mL every 6 as needed for wheezing or shortness of breath, DuoNeb 3 mL neb 3 times daily and with guaifenesin but will increase from 600 p.o. twice daily as needed for cough to 1200 mg p.o. twice daily scheduled -Add flutter valve and incentive spirometry   Chronic Diastolic CHF -Appears euvolemic.   -Closely monitored respiratory and fluid status while she was on IV fluid -IVF now stopped and will continue Strict I's and O's and Daily Weights;  Intake/Output Summary (Last 24 hours) at 01/10/2023 1301 Last data filed at 01/10/2023 0945 Gross per 24 hour  Intake 840 ml  Output 1700 ml  Net -860 ml  -Continue to Monitor for S/Sx of Volume Overload   Essential Hypertension -Normotensive -Continue home meds with p.o. Hydralazine 50 mg 3 times daily and metoprolol tartrate 50 mg p.o. twice daily and irbesartan 300 mg p.o. daily -Continue to Monitor BP per Protocol -Last BP Reading was 138/66   DM-2 -A1c 6.8% in 2023. Now HbA1c is now 7.7 -Continue with sensitive NovoLog sliding scale insulin before meals and at bedtime -Continued to monitor CBGs per protocol and glucoses -Glucose Trend: Recent Labs  Lab 01/07/23 1320 01/08/23 0350 01/09/23 0415  GLUCOSE  153* 309* 143*  -CBG Trend: Recent Labs  Lab 01/08/23 2203 01/09/23 0805 01/09/23 1208 01/09/23 1648 01/09/23 2108 01/10/23 0853 01/10/23 1157  GLUCAP 259* 147* 227* 267* 204* 168* 144*   AKI on CKD Stage 3a, improved -BUN/Cr Trend: Recent Labs  Lab 01/07/23 1320 01/08/23 0350 01/09/23 0415  BUN 37* 43* 34*  CREATININE 1.09* 1.32* 0.87  -Avoid Nephrotoxic Medications, Contrast Dyes, Hypotension and Dehydration to Ensure Adequate Renal Perfusion and will need to Renally Adjust Meds -Continue to Monitor and Trend Renal Function carefully and repeat CMP in the AM   Normocytic Anemia -Hgb/Hct Trend: Recent Labs  Lab 01/07/23 1320 01/08/23 0350 01/09/23 0415  HGB 10.4* 9.9* 9.5*  HCT 31.5* 31.3* 29.7*  MCV 89.5 92.1 90.8  -Will not Check Anemia Panel in the AM now that patient is going to go home with Home Hospice tomorrow when equipment is delivered -Continue to Monitor for S/Sx of Bleeding; No overt bleeding noted -Repeat CBC within 1 week if desired   Hypomagnesemia -Patient's Mag Level Trend: Recent Labs  Lab 01/08/23 0350 01/09/23 0415  MG 1.7 1.6*  -Replete with IV Mag Sulfate 2 grams -Continue to Monitor and Replete as Necessary -Repeat Mag within 1 week if desired   Hyponatremia -Na+ Trend: Recent Labs  Lab 01/07/23  1320 01/08/23 0350 01/09/23 0415  NA 128* 128* 132*  -Was given a trial of NS and now stopped -Continue to Monitor and Trend and repeat in the outpatient setting if wanted and necessary   GERD/GI Prophylaxis -Continue with PPI with Pantoprazole 40 mg p.o. daily  Hypoalbuminemia -Patient's Albumin Trend: Recent Labs  Lab 01/07/23 1320 01/08/23 0350 01/09/23 0415  ALBUMIN 2.9* 2.8* 2.6*  -Continue to Monitor and Trend and repeat CMP within 1 week   Assessment and Plan: No notes have been filed under this hospital service. Service: Hospitalist     {Tip this will not be part of the note when signed There is no height or  weight on file to calculate BMI. , ,  (Optional):26781}  {(NOTE) Pain control PDMP Statment (Optional):26782} Consultants: *** Procedures performed: ***  Disposition: {Plan; Disposition:26390} Diet recommendation:  {Diet_Plan:26776} DISCHARGE MEDICATION: Allergies as of 01/10/2023       Reactions   Welchol [colesevelam Hcl] Hives   Coreg [carvedilol] Other (See Comments)   Dizziness    Micardis [telmisartan] Itching   Mobic [meloxicam] Other (See Comments)   Unknown reaction   Norco [hydrocodone-acetaminophen] Other (See Comments)   "Did not feel right"   Norvasc [amlodipine] Swelling   Ankle swelling   Sulfa Antibiotics Itching   Tramadol Nausea Only, Other (See Comments)   Pt OK to take half tablets (25mg  doses) GI Intolerance (Takes as needed in 2024)   Celebrex [celecoxib] Hives, Itching, Rash   Glucosamine Itching, Rash   Lipitor [atorvastatin] Rash, Other (See Comments)   Foot pain   Statins Rash   Vibramycin [doxycycline] Rash, Other (See Comments)   Same reaction to Monohydrate and Hyclate        Medication List     STOP taking these medications    acetaminophen-codeine 300-15 MG tablet Commonly known as: TYLENOL #2   doxycycline 100 MG tablet Commonly known as: VIBRA-TABS   HYDROcodone bit-homatropine 5-1.5 MG/5ML syrup Commonly known as: HYCODAN   NovoLIN R FlexPen ReliOn 100 UNIT/ML FlexPen Generic drug: Insulin Regular Human   torsemide 20 MG tablet Commonly known as: DEMADEX   traMADol 50 MG tablet Commonly known as: ULTRAM       TAKE these medications    acetaminophen 325 MG tablet Commonly known as: TYLENOL Take 2 tablets (650 mg total) by mouth every 6 (six) hours. What changed:  when to take this reasons to take this   albuterol 108 (90 Base) MCG/ACT inhaler Commonly known as: VENTOLIN HFA INHALE 2 PUFFS INTO THE LUNGS EVERY 6 HOURS AS NEEDED FOR WHEEZING OR SHORTNESS OF BREATH   ALPRAZolam 0.5 MG tablet Commonly known as:  XANAX Take 1 tablet (0.5 mg total) by mouth 2 (two) times daily as needed for anxiety. What changed: additional instructions   aspirin EC 81 MG tablet Take 81 mg by mouth daily.   Atrovent HFA 17 MCG/ACT inhaler Generic drug: ipratropium Inhale 2 puffs into the lungs every 6 (six) hours as needed for wheezing.   benzonatate 200 MG capsule Commonly known as: TESSALON Take 1 capsule (200 mg total) by mouth 3 (three) times daily as needed for cough.   budesonide 0.25 MG/2ML nebulizer solution Commonly known as: PULMICORT Take 2 mLs (0.25 mg total) by nebulization 2 (two) times daily.   carboxymethylcellulose 0.5 % Soln Commonly known as: REFRESH PLUS Place 1 drop into both eyes 3 (three) times daily as needed (for dryness).   fluticasone 50 MCG/ACT nasal spray Commonly known as: FLONASE Place  1 spray into both nostrils daily as needed for allergies or rhinitis.   glipiZIDE 5 MG tablet Commonly known as: GLUCOTROL Take 5 mg by mouth See admin instructions. Take 5 mg by mouth in the morning before breakfast if BGL is over 200   guaiFENesin 600 MG 12 hr tablet Commonly known as: MUCINEX Take 600 mg by mouth 2 (two) times daily as needed for cough or to loosen phlegm.   hydrALAZINE 50 MG tablet Commonly known as: APRESOLINE Take 50 mg by mouth 3 (three) times daily.   ipratropium 0.02 % nebulizer solution Commonly known as: ATROVENT Take 1.25 mLs (0.25 mg total) by nebulization every 4 (four) hours as needed for wheezing or shortness of breath. USE 1 VIAL IN NEBULIZER 4 TIMES DAILY Strength: 0.02 % What changed: additional instructions   irbesartan 300 MG tablet Commonly known as: AVAPRO TAKE 1 TABLET BY MOUTH EVERY DAY   lidocaine 5 % Commonly known as: LIDODERM Place 1 patch onto the skin daily. Remove & Discard patch within 12 hours or as directed by MD Start taking on: January 11, 2023   metoprolol tartrate 50 MG tablet Commonly known as: LOPRESSOR Take 1 tablet  (50 mg total) by mouth 2 (two) times daily.   NexIUM 24HR 20 MG capsule Generic drug: esomeprazole Take 20 mg by mouth See admin instructions. Take 20 mg by mouth in the morning before breakfast and an additional 20 mg later in the day as needed for unresolved reflux   ondansetron 4 MG tablet Commonly known as: ZOFRAN Take 1 tablet (4 mg total) by mouth every 6 (six) hours as needed for nausea.   oxyCODONE 5 MG immediate release tablet Commonly known as: Oxy IR/ROXICODONE Take 1 tablet (5 mg total) by mouth every 8 (eight) hours as needed for moderate pain.   predniSONE 10 MG tablet Commonly known as: DELTASONE Take 1 tablet (10 mg total) by mouth daily with breakfast. Start taking on: January 11, 2023   PRESERVISION AREDS 2 PO Take 1 capsule by mouth in the morning and at bedtime.        Discharge Exam: There were no vitals filed for this visit. ***  Condition at discharge: {DC Condition:26389}  The results of significant diagnostics from this hospitalization (including imaging, microbiology, ancillary and laboratory) are listed below for reference.   Imaging Studies: DG Chest Portable 1 View  Result Date: 01/07/2023 CLINICAL DATA:  Cough, weakness. EXAM: PORTABLE CHEST 1 VIEW COMPARISON:  December 10, 2022.  February 20, 2022. FINDINGS: The heart size and mediastinal contours are within normal limits. Stable reticulonodular densities are noted throughout both lungs, most prominently seen in the left lung base. Severe degenerative changes are seen involving the left glenohumeral joint. IMPRESSION: Stable reticulonodular densities are noted throughout both lungs, most prominently seen in left lower lobe. This is concerning for chronic scarring or possibly atypical inflammation. Aortic Atherosclerosis (ICD10-I70.0). Electronically Signed   By: Lupita Raider M.D.   On: 01/07/2023 15:13    Microbiology: Results for orders placed or performed during the hospital encounter of  01/07/23  SARS Coronavirus 2 by RT PCR (hospital order, performed in [facility] hospital lab) *cepheid single result test* Anterior Nasal Swab     Status: None   Collection Time: 01/07/23  1:20 PM   Specimen: Anterior Nasal Swab  Result Value Ref Range Status   SARS Coronavirus 2 by RT PCR NEGATIVE NEGATIVE Final    Comment: (NOTE) SARS-CoV-2 target nucleic acids are NOT  DETECTED.  The SARS-CoV-2 RNA is generally detectable in upper and lower respiratory specimens during the acute phase of infection. The lowest concentration of SARS-CoV-2 viral copies this assay can detect is 250 copies / mL. A negative result does not preclude SARS-CoV-2 infection and should not be used as the sole basis for treatment or other patient management decisions.  A negative result may occur with improper specimen collection / handling, submission of specimen other than nasopharyngeal swab, presence of viral mutation(s) within the areas targeted by this assay, and inadequate number of viral copies (<250 copies / mL). A negative result must be combined with clinical observations, patient history, and epidemiological information.  Fact Sheet for Patients:   RoadLapTop.co.za  Fact Sheet for Healthcare Providers: http://kim-miller.com/  This test is not yet approved or  cleared by the [place] FDA and has been authorized for detection and/or diagnosis of SARS-CoV-2 by FDA under an Emergency Use Authorization (EUA).  This EUA will remain in effect (meaning this test can be used) for the duration of the COVID-19 declaration under Section 564(b)(1) of the Act, 21 U.S.C. section 360bbb-3(b)(1), unless the authorization is terminated or revoked sooner.  Performed at [facility], [address] [place], [place] [address]     Labs: CBC: Recent Labs  Lab 01/07/23 1320 01/08/23 0350 01/09/23 0415  WBC 7.5 3.1* 6.8  NEUTROABS  --   --   5.7  HGB 10.4* 9.9* 9.5*  HCT 31.5* 31.3* 29.7*  MCV 89.5 92.1 90.8  PLT 199 184 219   Basic Metabolic Panel: Recent Labs  Lab 01/07/23 1320 01/08/23 0350 01/09/23 0415  NA 128* 128* 132*  K 4.0 4.4 4.3  CL 88* 92* 98  CO2 29 25 25   GLUCOSE 153* 309* 143*  BUN 37* 43* 34*  CREATININE 1.09* 1.32* 0.87  CALCIUM 8.5* 8.4* 8.5*  MG  --  1.7 1.6*  PHOS  --  3.5 2.7   Liver Function Tests: Recent Labs  Lab 01/07/23 1320 01/08/23 0350 01/09/23 0415  AST 18  --  12*  ALT 12  --  11  ALKPHOS 65  --  57  BILITOT 0.9  --  0.6  PROT 6.0*  --  5.1*  ALBUMIN 2.9* 2.8* 2.6*   CBG: Recent Labs  Lab 01/09/23 1648 01/09/23 2108 01/10/23 0853 01/10/23 1157 01/10/23 1624  GLUCAP 267* 204* 168* 144* 250*    Discharge time spent: {LESS THAN/GREATER THAN:26388} 30 minutes.  Signed: Merlene Laughter, DO [facility] 01/10/2023

## 2023-01-10 NOTE — Plan of Care (Signed)
Spoke with family member Clinical cytogeneticist on phone. [facility] to pick up patient; patient awaiting transport. Placed AVS paperwork in discharge packet for [facility] to transport. Patient to be served by hospice services when she returns home. Haydee Salter, RN 01/10/23 4:46 PM

## 2023-01-10 NOTE — TOC Transition Note (Signed)
Transition of Care [facility]) - CM/SW Discharge Note   Patient Details  Name: PATTIANNE MELINE MRN: [id number] Date of Birth: 1926-02-09  [facility]) CM/SW Contact:  Amada Jupiter, LCSW Phone Number: 01/10/2023, 2:42 PM   Clinical Narrative:     Have confirmed that all DME arranged via [facility] has been delivered to the home.  Family in place to accept pt and [facility] has been called for transport. Pt discharging home with Hospice care via [facility].  No further TOC needs.  Final next level of care: Home w Hospice Care Barriers to Discharge: Barriers Resolved   Patient Goals and CMS Choice      Discharge Placement                  Patient to be transferred to facility by: [facility] to transport home      Discharge Plan and Services Additional resources added to the After Visit Summary for   In-house Referral: Clinical Social Work                                   Social Determinants of Health (SDOH) Interventions SDOH Screenings   Food Insecurity: No Food Insecurity (01/07/2023)  Housing: Low Risk  (01/07/2023)  Transportation Needs: No Transportation Needs (01/07/2023)  Utilities: Not At Risk (01/07/2023)  Tobacco Use: Low Risk  (01/07/2023)     Readmission Risk Interventions    01/10/2023    2:37 PM 02/18/2022   12:08 PM 02/14/2022   11:23 AM  Readmission Risk Prevention Plan  Transportation Screening Complete Complete Complete  Medication Review Oceanographer) Complete Complete Complete  PCP or Specialist appointment within 3-5 days of discharge Complete Complete Complete  HRI or Home Care Consult Complete Complete Complete  SW Recovery Care/Counseling Consult Complete Complete Complete  Palliative Care Screening Complete Not Applicable Not Applicable  Skilled Nursing Facility Not Applicable Complete Complete

## 2023-01-11 DIAGNOSIS — I5031 Acute diastolic (congestive) heart failure: Secondary | ICD-10-CM | POA: Diagnosis not present

## 2023-01-11 DIAGNOSIS — J441 Chronic obstructive pulmonary disease with (acute) exacerbation: Secondary | ICD-10-CM | POA: Diagnosis not present

## 2023-01-11 DIAGNOSIS — E11610 Type 2 diabetes mellitus with diabetic neuropathic arthropathy: Secondary | ICD-10-CM | POA: Diagnosis not present

## 2023-01-11 DIAGNOSIS — J9601 Acute respiratory failure with hypoxia: Secondary | ICD-10-CM | POA: Diagnosis not present

## 2023-01-11 DIAGNOSIS — N1831 Chronic kidney disease, stage 3a: Secondary | ICD-10-CM | POA: Diagnosis not present

## 2023-01-11 DIAGNOSIS — M1009 Idiopathic gout, multiple sites: Secondary | ICD-10-CM | POA: Diagnosis not present

## 2023-01-11 DIAGNOSIS — I10 Essential (primary) hypertension: Secondary | ICD-10-CM | POA: Diagnosis not present

## 2023-01-11 DIAGNOSIS — J189 Pneumonia, unspecified organism: Secondary | ICD-10-CM | POA: Diagnosis not present

## 2023-01-12 DIAGNOSIS — E11610 Type 2 diabetes mellitus with diabetic neuropathic arthropathy: Secondary | ICD-10-CM | POA: Diagnosis not present

## 2023-01-12 DIAGNOSIS — J441 Chronic obstructive pulmonary disease with (acute) exacerbation: Secondary | ICD-10-CM | POA: Diagnosis not present

## 2023-01-12 DIAGNOSIS — I5031 Acute diastolic (congestive) heart failure: Secondary | ICD-10-CM | POA: Diagnosis not present

## 2023-01-12 DIAGNOSIS — M1009 Idiopathic gout, multiple sites: Secondary | ICD-10-CM | POA: Diagnosis not present

## 2023-01-12 DIAGNOSIS — J9601 Acute respiratory failure with hypoxia: Secondary | ICD-10-CM | POA: Diagnosis not present

## 2023-01-12 DIAGNOSIS — I10 Essential (primary) hypertension: Secondary | ICD-10-CM | POA: Diagnosis not present

## 2023-01-13 DIAGNOSIS — I10 Essential (primary) hypertension: Secondary | ICD-10-CM | POA: Diagnosis not present

## 2023-01-13 DIAGNOSIS — J9601 Acute respiratory failure with hypoxia: Secondary | ICD-10-CM | POA: Diagnosis not present

## 2023-01-13 DIAGNOSIS — I5031 Acute diastolic (congestive) heart failure: Secondary | ICD-10-CM | POA: Diagnosis not present

## 2023-01-13 DIAGNOSIS — E11610 Type 2 diabetes mellitus with diabetic neuropathic arthropathy: Secondary | ICD-10-CM | POA: Diagnosis not present

## 2023-01-13 DIAGNOSIS — J441 Chronic obstructive pulmonary disease with (acute) exacerbation: Secondary | ICD-10-CM | POA: Diagnosis not present

## 2023-01-13 DIAGNOSIS — M1009 Idiopathic gout, multiple sites: Secondary | ICD-10-CM | POA: Diagnosis not present

## 2023-01-14 DIAGNOSIS — M1009 Idiopathic gout, multiple sites: Secondary | ICD-10-CM | POA: Diagnosis not present

## 2023-01-14 DIAGNOSIS — J9601 Acute respiratory failure with hypoxia: Secondary | ICD-10-CM | POA: Diagnosis not present

## 2023-01-14 DIAGNOSIS — J441 Chronic obstructive pulmonary disease with (acute) exacerbation: Secondary | ICD-10-CM | POA: Diagnosis not present

## 2023-01-14 DIAGNOSIS — I5031 Acute diastolic (congestive) heart failure: Secondary | ICD-10-CM | POA: Diagnosis not present

## 2023-01-14 DIAGNOSIS — E11610 Type 2 diabetes mellitus with diabetic neuropathic arthropathy: Secondary | ICD-10-CM | POA: Diagnosis not present

## 2023-01-14 DIAGNOSIS — I10 Essential (primary) hypertension: Secondary | ICD-10-CM | POA: Diagnosis not present

## 2023-01-15 DIAGNOSIS — J9601 Acute respiratory failure with hypoxia: Secondary | ICD-10-CM | POA: Diagnosis not present

## 2023-01-15 DIAGNOSIS — I10 Essential (primary) hypertension: Secondary | ICD-10-CM | POA: Diagnosis not present

## 2023-01-15 DIAGNOSIS — M1009 Idiopathic gout, multiple sites: Secondary | ICD-10-CM | POA: Diagnosis not present

## 2023-01-15 DIAGNOSIS — E11610 Type 2 diabetes mellitus with diabetic neuropathic arthropathy: Secondary | ICD-10-CM | POA: Diagnosis not present

## 2023-01-15 DIAGNOSIS — I5031 Acute diastolic (congestive) heart failure: Secondary | ICD-10-CM | POA: Diagnosis not present

## 2023-01-15 DIAGNOSIS — J441 Chronic obstructive pulmonary disease with (acute) exacerbation: Secondary | ICD-10-CM | POA: Diagnosis not present

## 2023-01-16 DIAGNOSIS — M1009 Idiopathic gout, multiple sites: Secondary | ICD-10-CM | POA: Diagnosis not present

## 2023-01-16 DIAGNOSIS — J441 Chronic obstructive pulmonary disease with (acute) exacerbation: Secondary | ICD-10-CM | POA: Diagnosis not present

## 2023-01-16 DIAGNOSIS — J9601 Acute respiratory failure with hypoxia: Secondary | ICD-10-CM | POA: Diagnosis not present

## 2023-01-16 DIAGNOSIS — I10 Essential (primary) hypertension: Secondary | ICD-10-CM | POA: Diagnosis not present

## 2023-01-16 DIAGNOSIS — E11610 Type 2 diabetes mellitus with diabetic neuropathic arthropathy: Secondary | ICD-10-CM | POA: Diagnosis not present

## 2023-01-16 DIAGNOSIS — I5031 Acute diastolic (congestive) heart failure: Secondary | ICD-10-CM | POA: Diagnosis not present

## 2023-01-17 DIAGNOSIS — J9601 Acute respiratory failure with hypoxia: Secondary | ICD-10-CM | POA: Diagnosis not present

## 2023-01-17 DIAGNOSIS — I5031 Acute diastolic (congestive) heart failure: Secondary | ICD-10-CM | POA: Diagnosis not present

## 2023-01-17 DIAGNOSIS — M1009 Idiopathic gout, multiple sites: Secondary | ICD-10-CM | POA: Diagnosis not present

## 2023-01-17 DIAGNOSIS — J441 Chronic obstructive pulmonary disease with (acute) exacerbation: Secondary | ICD-10-CM | POA: Diagnosis not present

## 2023-01-17 DIAGNOSIS — I10 Essential (primary) hypertension: Secondary | ICD-10-CM | POA: Diagnosis not present

## 2023-01-17 DIAGNOSIS — E11610 Type 2 diabetes mellitus with diabetic neuropathic arthropathy: Secondary | ICD-10-CM | POA: Diagnosis not present

## 2023-01-18 DIAGNOSIS — I10 Essential (primary) hypertension: Secondary | ICD-10-CM | POA: Diagnosis not present

## 2023-01-18 DIAGNOSIS — E11610 Type 2 diabetes mellitus with diabetic neuropathic arthropathy: Secondary | ICD-10-CM | POA: Diagnosis not present

## 2023-01-18 DIAGNOSIS — J9601 Acute respiratory failure with hypoxia: Secondary | ICD-10-CM | POA: Diagnosis not present

## 2023-01-18 DIAGNOSIS — I5031 Acute diastolic (congestive) heart failure: Secondary | ICD-10-CM | POA: Diagnosis not present

## 2023-01-18 DIAGNOSIS — J441 Chronic obstructive pulmonary disease with (acute) exacerbation: Secondary | ICD-10-CM | POA: Diagnosis not present

## 2023-01-18 DIAGNOSIS — M1009 Idiopathic gout, multiple sites: Secondary | ICD-10-CM | POA: Diagnosis not present

## 2023-01-19 DIAGNOSIS — I5031 Acute diastolic (congestive) heart failure: Secondary | ICD-10-CM | POA: Diagnosis not present

## 2023-01-19 DIAGNOSIS — M1009 Idiopathic gout, multiple sites: Secondary | ICD-10-CM | POA: Diagnosis not present

## 2023-01-19 DIAGNOSIS — J9601 Acute respiratory failure with hypoxia: Secondary | ICD-10-CM | POA: Diagnosis not present

## 2023-01-19 DIAGNOSIS — J441 Chronic obstructive pulmonary disease with (acute) exacerbation: Secondary | ICD-10-CM | POA: Diagnosis not present

## 2023-01-19 DIAGNOSIS — I10 Essential (primary) hypertension: Secondary | ICD-10-CM | POA: Diagnosis not present

## 2023-01-19 DIAGNOSIS — E11610 Type 2 diabetes mellitus with diabetic neuropathic arthropathy: Secondary | ICD-10-CM | POA: Diagnosis not present

## 2023-01-20 DIAGNOSIS — M1009 Idiopathic gout, multiple sites: Secondary | ICD-10-CM | POA: Diagnosis not present

## 2023-01-20 DIAGNOSIS — I5031 Acute diastolic (congestive) heart failure: Secondary | ICD-10-CM | POA: Diagnosis not present

## 2023-01-20 DIAGNOSIS — I10 Essential (primary) hypertension: Secondary | ICD-10-CM | POA: Diagnosis not present

## 2023-01-20 DIAGNOSIS — J441 Chronic obstructive pulmonary disease with (acute) exacerbation: Secondary | ICD-10-CM | POA: Diagnosis not present

## 2023-01-20 DIAGNOSIS — E11610 Type 2 diabetes mellitus with diabetic neuropathic arthropathy: Secondary | ICD-10-CM | POA: Diagnosis not present

## 2023-01-20 DIAGNOSIS — J9601 Acute respiratory failure with hypoxia: Secondary | ICD-10-CM | POA: Diagnosis not present

## 2023-01-21 DIAGNOSIS — J441 Chronic obstructive pulmonary disease with (acute) exacerbation: Secondary | ICD-10-CM | POA: Diagnosis not present

## 2023-01-21 DIAGNOSIS — I5031 Acute diastolic (congestive) heart failure: Secondary | ICD-10-CM | POA: Diagnosis not present

## 2023-01-21 DIAGNOSIS — M1009 Idiopathic gout, multiple sites: Secondary | ICD-10-CM | POA: Diagnosis not present

## 2023-01-21 DIAGNOSIS — J9601 Acute respiratory failure with hypoxia: Secondary | ICD-10-CM | POA: Diagnosis not present

## 2023-01-21 DIAGNOSIS — E11610 Type 2 diabetes mellitus with diabetic neuropathic arthropathy: Secondary | ICD-10-CM | POA: Diagnosis not present

## 2023-01-21 DIAGNOSIS — J189 Pneumonia, unspecified organism: Secondary | ICD-10-CM | POA: Diagnosis not present

## 2023-01-21 DIAGNOSIS — I10 Essential (primary) hypertension: Secondary | ICD-10-CM | POA: Diagnosis not present

## 2023-01-21 DIAGNOSIS — N1831 Chronic kidney disease, stage 3a: Secondary | ICD-10-CM | POA: Diagnosis not present

## 2023-01-22 DIAGNOSIS — E11610 Type 2 diabetes mellitus with diabetic neuropathic arthropathy: Secondary | ICD-10-CM | POA: Diagnosis not present

## 2023-01-22 DIAGNOSIS — M1009 Idiopathic gout, multiple sites: Secondary | ICD-10-CM | POA: Diagnosis not present

## 2023-01-22 DIAGNOSIS — I10 Essential (primary) hypertension: Secondary | ICD-10-CM | POA: Diagnosis not present

## 2023-01-22 DIAGNOSIS — J9601 Acute respiratory failure with hypoxia: Secondary | ICD-10-CM | POA: Diagnosis not present

## 2023-01-22 DIAGNOSIS — I5031 Acute diastolic (congestive) heart failure: Secondary | ICD-10-CM | POA: Diagnosis not present

## 2023-01-22 DIAGNOSIS — J441 Chronic obstructive pulmonary disease with (acute) exacerbation: Secondary | ICD-10-CM | POA: Diagnosis not present

## 2023-01-23 DIAGNOSIS — J441 Chronic obstructive pulmonary disease with (acute) exacerbation: Secondary | ICD-10-CM | POA: Diagnosis not present

## 2023-01-23 DIAGNOSIS — M1009 Idiopathic gout, multiple sites: Secondary | ICD-10-CM | POA: Diagnosis not present

## 2023-01-23 DIAGNOSIS — E11610 Type 2 diabetes mellitus with diabetic neuropathic arthropathy: Secondary | ICD-10-CM | POA: Diagnosis not present

## 2023-01-23 DIAGNOSIS — I10 Essential (primary) hypertension: Secondary | ICD-10-CM | POA: Diagnosis not present

## 2023-01-23 DIAGNOSIS — I5031 Acute diastolic (congestive) heart failure: Secondary | ICD-10-CM | POA: Diagnosis not present

## 2023-01-23 DIAGNOSIS — J9601 Acute respiratory failure with hypoxia: Secondary | ICD-10-CM | POA: Diagnosis not present

## 2023-01-24 DIAGNOSIS — I10 Essential (primary) hypertension: Secondary | ICD-10-CM | POA: Diagnosis not present

## 2023-01-24 DIAGNOSIS — I5031 Acute diastolic (congestive) heart failure: Secondary | ICD-10-CM | POA: Diagnosis not present

## 2023-01-24 DIAGNOSIS — M1009 Idiopathic gout, multiple sites: Secondary | ICD-10-CM | POA: Diagnosis not present

## 2023-01-24 DIAGNOSIS — J441 Chronic obstructive pulmonary disease with (acute) exacerbation: Secondary | ICD-10-CM | POA: Diagnosis not present

## 2023-01-24 DIAGNOSIS — E11610 Type 2 diabetes mellitus with diabetic neuropathic arthropathy: Secondary | ICD-10-CM | POA: Diagnosis not present

## 2023-01-24 DIAGNOSIS — J9601 Acute respiratory failure with hypoxia: Secondary | ICD-10-CM | POA: Diagnosis not present

## 2023-01-25 DIAGNOSIS — I5031 Acute diastolic (congestive) heart failure: Secondary | ICD-10-CM | POA: Diagnosis not present

## 2023-01-25 DIAGNOSIS — M1009 Idiopathic gout, multiple sites: Secondary | ICD-10-CM | POA: Diagnosis not present

## 2023-01-25 DIAGNOSIS — J9601 Acute respiratory failure with hypoxia: Secondary | ICD-10-CM | POA: Diagnosis not present

## 2023-01-25 DIAGNOSIS — E11610 Type 2 diabetes mellitus with diabetic neuropathic arthropathy: Secondary | ICD-10-CM | POA: Diagnosis not present

## 2023-01-25 DIAGNOSIS — I10 Essential (primary) hypertension: Secondary | ICD-10-CM | POA: Diagnosis not present

## 2023-01-25 DIAGNOSIS — J441 Chronic obstructive pulmonary disease with (acute) exacerbation: Secondary | ICD-10-CM | POA: Diagnosis not present

## 2023-01-26 DIAGNOSIS — J9601 Acute respiratory failure with hypoxia: Secondary | ICD-10-CM | POA: Diagnosis not present

## 2023-01-26 DIAGNOSIS — I5031 Acute diastolic (congestive) heart failure: Secondary | ICD-10-CM | POA: Diagnosis not present

## 2023-01-26 DIAGNOSIS — E11610 Type 2 diabetes mellitus with diabetic neuropathic arthropathy: Secondary | ICD-10-CM | POA: Diagnosis not present

## 2023-01-26 DIAGNOSIS — J441 Chronic obstructive pulmonary disease with (acute) exacerbation: Secondary | ICD-10-CM | POA: Diagnosis not present

## 2023-01-26 DIAGNOSIS — M1009 Idiopathic gout, multiple sites: Secondary | ICD-10-CM | POA: Diagnosis not present

## 2023-01-26 DIAGNOSIS — I10 Essential (primary) hypertension: Secondary | ICD-10-CM | POA: Diagnosis not present

## 2023-01-27 DIAGNOSIS — I10 Essential (primary) hypertension: Secondary | ICD-10-CM | POA: Diagnosis not present

## 2023-01-27 DIAGNOSIS — J9601 Acute respiratory failure with hypoxia: Secondary | ICD-10-CM | POA: Diagnosis not present

## 2023-01-27 DIAGNOSIS — J441 Chronic obstructive pulmonary disease with (acute) exacerbation: Secondary | ICD-10-CM | POA: Diagnosis not present

## 2023-01-27 DIAGNOSIS — I5031 Acute diastolic (congestive) heart failure: Secondary | ICD-10-CM | POA: Diagnosis not present

## 2023-01-27 DIAGNOSIS — E11610 Type 2 diabetes mellitus with diabetic neuropathic arthropathy: Secondary | ICD-10-CM | POA: Diagnosis not present

## 2023-01-27 DIAGNOSIS — M1009 Idiopathic gout, multiple sites: Secondary | ICD-10-CM | POA: Diagnosis not present

## 2023-01-28 DIAGNOSIS — I10 Essential (primary) hypertension: Secondary | ICD-10-CM | POA: Diagnosis not present

## 2023-01-28 DIAGNOSIS — M1009 Idiopathic gout, multiple sites: Secondary | ICD-10-CM | POA: Diagnosis not present

## 2023-01-28 DIAGNOSIS — I5031 Acute diastolic (congestive) heart failure: Secondary | ICD-10-CM | POA: Diagnosis not present

## 2023-01-28 DIAGNOSIS — J441 Chronic obstructive pulmonary disease with (acute) exacerbation: Secondary | ICD-10-CM | POA: Diagnosis not present

## 2023-01-28 DIAGNOSIS — J9601 Acute respiratory failure with hypoxia: Secondary | ICD-10-CM | POA: Diagnosis not present

## 2023-01-28 DIAGNOSIS — E11610 Type 2 diabetes mellitus with diabetic neuropathic arthropathy: Secondary | ICD-10-CM | POA: Diagnosis not present

## 2023-01-29 DIAGNOSIS — J441 Chronic obstructive pulmonary disease with (acute) exacerbation: Secondary | ICD-10-CM | POA: Diagnosis not present

## 2023-01-29 DIAGNOSIS — E11610 Type 2 diabetes mellitus with diabetic neuropathic arthropathy: Secondary | ICD-10-CM | POA: Diagnosis not present

## 2023-01-29 DIAGNOSIS — J9601 Acute respiratory failure with hypoxia: Secondary | ICD-10-CM | POA: Diagnosis not present

## 2023-01-29 DIAGNOSIS — I10 Essential (primary) hypertension: Secondary | ICD-10-CM | POA: Diagnosis not present

## 2023-01-29 DIAGNOSIS — I5031 Acute diastolic (congestive) heart failure: Secondary | ICD-10-CM | POA: Diagnosis not present

## 2023-01-29 DIAGNOSIS — M1009 Idiopathic gout, multiple sites: Secondary | ICD-10-CM | POA: Diagnosis not present

## 2023-01-30 DIAGNOSIS — I5031 Acute diastolic (congestive) heart failure: Secondary | ICD-10-CM | POA: Diagnosis not present

## 2023-01-30 DIAGNOSIS — J441 Chronic obstructive pulmonary disease with (acute) exacerbation: Secondary | ICD-10-CM | POA: Diagnosis not present

## 2023-01-30 DIAGNOSIS — E11610 Type 2 diabetes mellitus with diabetic neuropathic arthropathy: Secondary | ICD-10-CM | POA: Diagnosis not present

## 2023-01-30 DIAGNOSIS — I10 Essential (primary) hypertension: Secondary | ICD-10-CM | POA: Diagnosis not present

## 2023-01-30 DIAGNOSIS — M1009 Idiopathic gout, multiple sites: Secondary | ICD-10-CM | POA: Diagnosis not present

## 2023-01-30 DIAGNOSIS — J9601 Acute respiratory failure with hypoxia: Secondary | ICD-10-CM | POA: Diagnosis not present

## 2023-01-31 DIAGNOSIS — I5031 Acute diastolic (congestive) heart failure: Secondary | ICD-10-CM | POA: Diagnosis not present

## 2023-01-31 DIAGNOSIS — J441 Chronic obstructive pulmonary disease with (acute) exacerbation: Secondary | ICD-10-CM | POA: Diagnosis not present

## 2023-01-31 DIAGNOSIS — I10 Essential (primary) hypertension: Secondary | ICD-10-CM | POA: Diagnosis not present

## 2023-01-31 DIAGNOSIS — M1009 Idiopathic gout, multiple sites: Secondary | ICD-10-CM | POA: Diagnosis not present

## 2023-01-31 DIAGNOSIS — E11610 Type 2 diabetes mellitus with diabetic neuropathic arthropathy: Secondary | ICD-10-CM | POA: Diagnosis not present

## 2023-01-31 DIAGNOSIS — J9601 Acute respiratory failure with hypoxia: Secondary | ICD-10-CM | POA: Diagnosis not present

## 2023-02-01 DIAGNOSIS — I5031 Acute diastolic (congestive) heart failure: Secondary | ICD-10-CM | POA: Diagnosis not present

## 2023-02-01 DIAGNOSIS — J441 Chronic obstructive pulmonary disease with (acute) exacerbation: Secondary | ICD-10-CM | POA: Diagnosis not present

## 2023-02-01 DIAGNOSIS — I10 Essential (primary) hypertension: Secondary | ICD-10-CM | POA: Diagnosis not present

## 2023-02-01 DIAGNOSIS — J9601 Acute respiratory failure with hypoxia: Secondary | ICD-10-CM | POA: Diagnosis not present

## 2023-02-01 DIAGNOSIS — M1009 Idiopathic gout, multiple sites: Secondary | ICD-10-CM | POA: Diagnosis not present

## 2023-02-01 DIAGNOSIS — E11610 Type 2 diabetes mellitus with diabetic neuropathic arthropathy: Secondary | ICD-10-CM | POA: Diagnosis not present

## 2023-02-02 DIAGNOSIS — J9601 Acute respiratory failure with hypoxia: Secondary | ICD-10-CM | POA: Diagnosis not present

## 2023-02-02 DIAGNOSIS — J441 Chronic obstructive pulmonary disease with (acute) exacerbation: Secondary | ICD-10-CM | POA: Diagnosis not present

## 2023-02-02 DIAGNOSIS — E11610 Type 2 diabetes mellitus with diabetic neuropathic arthropathy: Secondary | ICD-10-CM | POA: Diagnosis not present

## 2023-02-02 DIAGNOSIS — I10 Essential (primary) hypertension: Secondary | ICD-10-CM | POA: Diagnosis not present

## 2023-02-02 DIAGNOSIS — I5031 Acute diastolic (congestive) heart failure: Secondary | ICD-10-CM | POA: Diagnosis not present

## 2023-02-02 DIAGNOSIS — M1009 Idiopathic gout, multiple sites: Secondary | ICD-10-CM | POA: Diagnosis not present

## 2023-02-03 DIAGNOSIS — I10 Essential (primary) hypertension: Secondary | ICD-10-CM | POA: Diagnosis not present

## 2023-02-03 DIAGNOSIS — E11610 Type 2 diabetes mellitus with diabetic neuropathic arthropathy: Secondary | ICD-10-CM | POA: Diagnosis not present

## 2023-02-03 DIAGNOSIS — M1009 Idiopathic gout, multiple sites: Secondary | ICD-10-CM | POA: Diagnosis not present

## 2023-02-03 DIAGNOSIS — J441 Chronic obstructive pulmonary disease with (acute) exacerbation: Secondary | ICD-10-CM | POA: Diagnosis not present

## 2023-02-03 DIAGNOSIS — J9601 Acute respiratory failure with hypoxia: Secondary | ICD-10-CM | POA: Diagnosis not present

## 2023-02-03 DIAGNOSIS — I5031 Acute diastolic (congestive) heart failure: Secondary | ICD-10-CM | POA: Diagnosis not present

## 2023-02-04 DIAGNOSIS — J9601 Acute respiratory failure with hypoxia: Secondary | ICD-10-CM | POA: Diagnosis not present

## 2023-02-04 DIAGNOSIS — E11610 Type 2 diabetes mellitus with diabetic neuropathic arthropathy: Secondary | ICD-10-CM | POA: Diagnosis not present

## 2023-02-04 DIAGNOSIS — J441 Chronic obstructive pulmonary disease with (acute) exacerbation: Secondary | ICD-10-CM | POA: Diagnosis not present

## 2023-02-04 DIAGNOSIS — I5031 Acute diastolic (congestive) heart failure: Secondary | ICD-10-CM | POA: Diagnosis not present

## 2023-02-04 DIAGNOSIS — I10 Essential (primary) hypertension: Secondary | ICD-10-CM | POA: Diagnosis not present

## 2023-02-04 DIAGNOSIS — M1009 Idiopathic gout, multiple sites: Secondary | ICD-10-CM | POA: Diagnosis not present

## 2023-02-05 DIAGNOSIS — I5031 Acute diastolic (congestive) heart failure: Secondary | ICD-10-CM | POA: Diagnosis not present

## 2023-02-05 DIAGNOSIS — E11610 Type 2 diabetes mellitus with diabetic neuropathic arthropathy: Secondary | ICD-10-CM | POA: Diagnosis not present

## 2023-02-05 DIAGNOSIS — M1009 Idiopathic gout, multiple sites: Secondary | ICD-10-CM | POA: Diagnosis not present

## 2023-02-05 DIAGNOSIS — I10 Essential (primary) hypertension: Secondary | ICD-10-CM | POA: Diagnosis not present

## 2023-02-05 DIAGNOSIS — J441 Chronic obstructive pulmonary disease with (acute) exacerbation: Secondary | ICD-10-CM | POA: Diagnosis not present

## 2023-02-05 DIAGNOSIS — J9601 Acute respiratory failure with hypoxia: Secondary | ICD-10-CM | POA: Diagnosis not present

## 2023-02-06 DIAGNOSIS — J441 Chronic obstructive pulmonary disease with (acute) exacerbation: Secondary | ICD-10-CM | POA: Diagnosis not present

## 2023-02-06 DIAGNOSIS — I5031 Acute diastolic (congestive) heart failure: Secondary | ICD-10-CM | POA: Diagnosis not present

## 2023-02-06 DIAGNOSIS — E11610 Type 2 diabetes mellitus with diabetic neuropathic arthropathy: Secondary | ICD-10-CM | POA: Diagnosis not present

## 2023-02-06 DIAGNOSIS — M1009 Idiopathic gout, multiple sites: Secondary | ICD-10-CM | POA: Diagnosis not present

## 2023-02-06 DIAGNOSIS — J9601 Acute respiratory failure with hypoxia: Secondary | ICD-10-CM | POA: Diagnosis not present

## 2023-02-06 DIAGNOSIS — I10 Essential (primary) hypertension: Secondary | ICD-10-CM | POA: Diagnosis not present

## 2023-02-07 DIAGNOSIS — M1009 Idiopathic gout, multiple sites: Secondary | ICD-10-CM | POA: Diagnosis not present

## 2023-02-07 DIAGNOSIS — E11610 Type 2 diabetes mellitus with diabetic neuropathic arthropathy: Secondary | ICD-10-CM | POA: Diagnosis not present

## 2023-02-07 DIAGNOSIS — J441 Chronic obstructive pulmonary disease with (acute) exacerbation: Secondary | ICD-10-CM | POA: Diagnosis not present

## 2023-02-07 DIAGNOSIS — J9601 Acute respiratory failure with hypoxia: Secondary | ICD-10-CM | POA: Diagnosis not present

## 2023-02-07 DIAGNOSIS — I10 Essential (primary) hypertension: Secondary | ICD-10-CM | POA: Diagnosis not present

## 2023-02-07 DIAGNOSIS — I5031 Acute diastolic (congestive) heart failure: Secondary | ICD-10-CM | POA: Diagnosis not present

## 2023-02-08 DIAGNOSIS — I10 Essential (primary) hypertension: Secondary | ICD-10-CM | POA: Diagnosis not present

## 2023-02-08 DIAGNOSIS — E11610 Type 2 diabetes mellitus with diabetic neuropathic arthropathy: Secondary | ICD-10-CM | POA: Diagnosis not present

## 2023-02-08 DIAGNOSIS — M1009 Idiopathic gout, multiple sites: Secondary | ICD-10-CM | POA: Diagnosis not present

## 2023-02-08 DIAGNOSIS — J9601 Acute respiratory failure with hypoxia: Secondary | ICD-10-CM | POA: Diagnosis not present

## 2023-02-08 DIAGNOSIS — I5031 Acute diastolic (congestive) heart failure: Secondary | ICD-10-CM | POA: Diagnosis not present

## 2023-02-08 DIAGNOSIS — J441 Chronic obstructive pulmonary disease with (acute) exacerbation: Secondary | ICD-10-CM | POA: Diagnosis not present

## 2023-02-09 DIAGNOSIS — I10 Essential (primary) hypertension: Secondary | ICD-10-CM | POA: Diagnosis not present

## 2023-02-09 DIAGNOSIS — M1009 Idiopathic gout, multiple sites: Secondary | ICD-10-CM | POA: Diagnosis not present

## 2023-02-09 DIAGNOSIS — J9601 Acute respiratory failure with hypoxia: Secondary | ICD-10-CM | POA: Diagnosis not present

## 2023-02-09 DIAGNOSIS — J441 Chronic obstructive pulmonary disease with (acute) exacerbation: Secondary | ICD-10-CM | POA: Diagnosis not present

## 2023-02-09 DIAGNOSIS — E11610 Type 2 diabetes mellitus with diabetic neuropathic arthropathy: Secondary | ICD-10-CM | POA: Diagnosis not present

## 2023-02-09 DIAGNOSIS — I5031 Acute diastolic (congestive) heart failure: Secondary | ICD-10-CM | POA: Diagnosis not present

## 2023-02-10 DIAGNOSIS — I5031 Acute diastolic (congestive) heart failure: Secondary | ICD-10-CM | POA: Diagnosis not present

## 2023-02-10 DIAGNOSIS — J9601 Acute respiratory failure with hypoxia: Secondary | ICD-10-CM | POA: Diagnosis not present

## 2023-02-10 DIAGNOSIS — M1009 Idiopathic gout, multiple sites: Secondary | ICD-10-CM | POA: Diagnosis not present

## 2023-02-10 DIAGNOSIS — J441 Chronic obstructive pulmonary disease with (acute) exacerbation: Secondary | ICD-10-CM | POA: Diagnosis not present

## 2023-02-10 DIAGNOSIS — I10 Essential (primary) hypertension: Secondary | ICD-10-CM | POA: Diagnosis not present

## 2023-02-10 DIAGNOSIS — E11610 Type 2 diabetes mellitus with diabetic neuropathic arthropathy: Secondary | ICD-10-CM | POA: Diagnosis not present

## 2023-02-11 DIAGNOSIS — J9601 Acute respiratory failure with hypoxia: Secondary | ICD-10-CM | POA: Diagnosis not present

## 2023-02-11 DIAGNOSIS — E11610 Type 2 diabetes mellitus with diabetic neuropathic arthropathy: Secondary | ICD-10-CM | POA: Diagnosis not present

## 2023-02-11 DIAGNOSIS — I10 Essential (primary) hypertension: Secondary | ICD-10-CM | POA: Diagnosis not present

## 2023-02-11 DIAGNOSIS — J441 Chronic obstructive pulmonary disease with (acute) exacerbation: Secondary | ICD-10-CM | POA: Diagnosis not present

## 2023-02-11 DIAGNOSIS — M1009 Idiopathic gout, multiple sites: Secondary | ICD-10-CM | POA: Diagnosis not present

## 2023-02-11 DIAGNOSIS — I5031 Acute diastolic (congestive) heart failure: Secondary | ICD-10-CM | POA: Diagnosis not present

## 2023-02-12 DIAGNOSIS — E11610 Type 2 diabetes mellitus with diabetic neuropathic arthropathy: Secondary | ICD-10-CM | POA: Diagnosis not present

## 2023-02-12 DIAGNOSIS — M1009 Idiopathic gout, multiple sites: Secondary | ICD-10-CM | POA: Diagnosis not present

## 2023-02-12 DIAGNOSIS — J9601 Acute respiratory failure with hypoxia: Secondary | ICD-10-CM | POA: Diagnosis not present

## 2023-02-12 DIAGNOSIS — I10 Essential (primary) hypertension: Secondary | ICD-10-CM | POA: Diagnosis not present

## 2023-02-12 DIAGNOSIS — I5031 Acute diastolic (congestive) heart failure: Secondary | ICD-10-CM | POA: Diagnosis not present

## 2023-02-12 DIAGNOSIS — J441 Chronic obstructive pulmonary disease with (acute) exacerbation: Secondary | ICD-10-CM | POA: Diagnosis not present

## 2023-02-13 DIAGNOSIS — I5031 Acute diastolic (congestive) heart failure: Secondary | ICD-10-CM | POA: Diagnosis not present

## 2023-02-13 DIAGNOSIS — M1009 Idiopathic gout, multiple sites: Secondary | ICD-10-CM | POA: Diagnosis not present

## 2023-02-13 DIAGNOSIS — J441 Chronic obstructive pulmonary disease with (acute) exacerbation: Secondary | ICD-10-CM | POA: Diagnosis not present

## 2023-02-13 DIAGNOSIS — E11610 Type 2 diabetes mellitus with diabetic neuropathic arthropathy: Secondary | ICD-10-CM | POA: Diagnosis not present

## 2023-02-13 DIAGNOSIS — J9601 Acute respiratory failure with hypoxia: Secondary | ICD-10-CM | POA: Diagnosis not present

## 2023-02-13 DIAGNOSIS — I10 Essential (primary) hypertension: Secondary | ICD-10-CM | POA: Diagnosis not present

## 2023-02-14 DIAGNOSIS — J9601 Acute respiratory failure with hypoxia: Secondary | ICD-10-CM | POA: Diagnosis not present

## 2023-02-14 DIAGNOSIS — J441 Chronic obstructive pulmonary disease with (acute) exacerbation: Secondary | ICD-10-CM | POA: Diagnosis not present

## 2023-02-14 DIAGNOSIS — I5031 Acute diastolic (congestive) heart failure: Secondary | ICD-10-CM | POA: Diagnosis not present

## 2023-02-14 DIAGNOSIS — M1009 Idiopathic gout, multiple sites: Secondary | ICD-10-CM | POA: Diagnosis not present

## 2023-02-14 DIAGNOSIS — E11610 Type 2 diabetes mellitus with diabetic neuropathic arthropathy: Secondary | ICD-10-CM | POA: Diagnosis not present

## 2023-02-14 DIAGNOSIS — I10 Essential (primary) hypertension: Secondary | ICD-10-CM | POA: Diagnosis not present

## 2023-02-15 DIAGNOSIS — I10 Essential (primary) hypertension: Secondary | ICD-10-CM | POA: Diagnosis not present

## 2023-02-15 DIAGNOSIS — I5031 Acute diastolic (congestive) heart failure: Secondary | ICD-10-CM | POA: Diagnosis not present

## 2023-02-15 DIAGNOSIS — E11610 Type 2 diabetes mellitus with diabetic neuropathic arthropathy: Secondary | ICD-10-CM | POA: Diagnosis not present

## 2023-02-15 DIAGNOSIS — J441 Chronic obstructive pulmonary disease with (acute) exacerbation: Secondary | ICD-10-CM | POA: Diagnosis not present

## 2023-02-15 DIAGNOSIS — J9601 Acute respiratory failure with hypoxia: Secondary | ICD-10-CM | POA: Diagnosis not present

## 2023-02-15 DIAGNOSIS — M1009 Idiopathic gout, multiple sites: Secondary | ICD-10-CM | POA: Diagnosis not present

## 2023-02-16 DIAGNOSIS — I10 Essential (primary) hypertension: Secondary | ICD-10-CM | POA: Diagnosis not present

## 2023-02-16 DIAGNOSIS — I5031 Acute diastolic (congestive) heart failure: Secondary | ICD-10-CM | POA: Diagnosis not present

## 2023-02-16 DIAGNOSIS — E11610 Type 2 diabetes mellitus with diabetic neuropathic arthropathy: Secondary | ICD-10-CM | POA: Diagnosis not present

## 2023-02-16 DIAGNOSIS — J9601 Acute respiratory failure with hypoxia: Secondary | ICD-10-CM | POA: Diagnosis not present

## 2023-02-16 DIAGNOSIS — J441 Chronic obstructive pulmonary disease with (acute) exacerbation: Secondary | ICD-10-CM | POA: Diagnosis not present

## 2023-02-16 DIAGNOSIS — M1009 Idiopathic gout, multiple sites: Secondary | ICD-10-CM | POA: Diagnosis not present

## 2023-02-17 DIAGNOSIS — J9601 Acute respiratory failure with hypoxia: Secondary | ICD-10-CM | POA: Diagnosis not present

## 2023-02-17 DIAGNOSIS — M1009 Idiopathic gout, multiple sites: Secondary | ICD-10-CM | POA: Diagnosis not present

## 2023-02-17 DIAGNOSIS — I5031 Acute diastolic (congestive) heart failure: Secondary | ICD-10-CM | POA: Diagnosis not present

## 2023-02-17 DIAGNOSIS — J441 Chronic obstructive pulmonary disease with (acute) exacerbation: Secondary | ICD-10-CM | POA: Diagnosis not present

## 2023-02-17 DIAGNOSIS — E11610 Type 2 diabetes mellitus with diabetic neuropathic arthropathy: Secondary | ICD-10-CM | POA: Diagnosis not present

## 2023-02-17 DIAGNOSIS — I10 Essential (primary) hypertension: Secondary | ICD-10-CM | POA: Diagnosis not present

## 2023-02-18 DIAGNOSIS — E11610 Type 2 diabetes mellitus with diabetic neuropathic arthropathy: Secondary | ICD-10-CM | POA: Diagnosis not present

## 2023-02-18 DIAGNOSIS — M1009 Idiopathic gout, multiple sites: Secondary | ICD-10-CM | POA: Diagnosis not present

## 2023-02-18 DIAGNOSIS — I5031 Acute diastolic (congestive) heart failure: Secondary | ICD-10-CM | POA: Diagnosis not present

## 2023-02-18 DIAGNOSIS — J441 Chronic obstructive pulmonary disease with (acute) exacerbation: Secondary | ICD-10-CM | POA: Diagnosis not present

## 2023-02-18 DIAGNOSIS — I10 Essential (primary) hypertension: Secondary | ICD-10-CM | POA: Diagnosis not present

## 2023-02-18 DIAGNOSIS — J9601 Acute respiratory failure with hypoxia: Secondary | ICD-10-CM | POA: Diagnosis not present

## 2023-02-19 DIAGNOSIS — I10 Essential (primary) hypertension: Secondary | ICD-10-CM | POA: Diagnosis not present

## 2023-02-19 DIAGNOSIS — I5031 Acute diastolic (congestive) heart failure: Secondary | ICD-10-CM | POA: Diagnosis not present

## 2023-02-19 DIAGNOSIS — E11610 Type 2 diabetes mellitus with diabetic neuropathic arthropathy: Secondary | ICD-10-CM | POA: Diagnosis not present

## 2023-02-19 DIAGNOSIS — M1009 Idiopathic gout, multiple sites: Secondary | ICD-10-CM | POA: Diagnosis not present

## 2023-02-19 DIAGNOSIS — J441 Chronic obstructive pulmonary disease with (acute) exacerbation: Secondary | ICD-10-CM | POA: Diagnosis not present

## 2023-02-19 DIAGNOSIS — J9601 Acute respiratory failure with hypoxia: Secondary | ICD-10-CM | POA: Diagnosis not present

## 2023-02-20 DIAGNOSIS — I5031 Acute diastolic (congestive) heart failure: Secondary | ICD-10-CM | POA: Diagnosis not present

## 2023-02-20 DIAGNOSIS — M1009 Idiopathic gout, multiple sites: Secondary | ICD-10-CM | POA: Diagnosis not present

## 2023-02-20 DIAGNOSIS — J9601 Acute respiratory failure with hypoxia: Secondary | ICD-10-CM | POA: Diagnosis not present

## 2023-02-20 DIAGNOSIS — I10 Essential (primary) hypertension: Secondary | ICD-10-CM | POA: Diagnosis not present

## 2023-02-20 DIAGNOSIS — J441 Chronic obstructive pulmonary disease with (acute) exacerbation: Secondary | ICD-10-CM | POA: Diagnosis not present

## 2023-02-20 DIAGNOSIS — E11610 Type 2 diabetes mellitus with diabetic neuropathic arthropathy: Secondary | ICD-10-CM | POA: Diagnosis not present

## 2023-02-21 DIAGNOSIS — J9601 Acute respiratory failure with hypoxia: Secondary | ICD-10-CM | POA: Diagnosis not present

## 2023-02-21 DIAGNOSIS — J189 Pneumonia, unspecified organism: Secondary | ICD-10-CM | POA: Diagnosis not present

## 2023-02-21 DIAGNOSIS — J441 Chronic obstructive pulmonary disease with (acute) exacerbation: Secondary | ICD-10-CM | POA: Diagnosis not present

## 2023-02-21 DIAGNOSIS — I10 Essential (primary) hypertension: Secondary | ICD-10-CM | POA: Diagnosis not present

## 2023-02-21 DIAGNOSIS — E11610 Type 2 diabetes mellitus with diabetic neuropathic arthropathy: Secondary | ICD-10-CM | POA: Diagnosis not present

## 2023-02-21 DIAGNOSIS — N1831 Chronic kidney disease, stage 3a: Secondary | ICD-10-CM | POA: Diagnosis not present

## 2023-02-21 DIAGNOSIS — M1009 Idiopathic gout, multiple sites: Secondary | ICD-10-CM | POA: Diagnosis not present

## 2023-02-21 DIAGNOSIS — I5031 Acute diastolic (congestive) heart failure: Secondary | ICD-10-CM | POA: Diagnosis not present

## 2023-02-24 DIAGNOSIS — I5031 Acute diastolic (congestive) heart failure: Secondary | ICD-10-CM | POA: Diagnosis not present

## 2023-02-24 DIAGNOSIS — I10 Essential (primary) hypertension: Secondary | ICD-10-CM | POA: Diagnosis not present

## 2023-02-24 DIAGNOSIS — J9601 Acute respiratory failure with hypoxia: Secondary | ICD-10-CM | POA: Diagnosis not present

## 2023-02-24 DIAGNOSIS — J441 Chronic obstructive pulmonary disease with (acute) exacerbation: Secondary | ICD-10-CM | POA: Diagnosis not present

## 2023-02-24 DIAGNOSIS — M1009 Idiopathic gout, multiple sites: Secondary | ICD-10-CM | POA: Diagnosis not present

## 2023-02-24 DIAGNOSIS — E11610 Type 2 diabetes mellitus with diabetic neuropathic arthropathy: Secondary | ICD-10-CM | POA: Diagnosis not present

## 2023-02-25 DIAGNOSIS — J441 Chronic obstructive pulmonary disease with (acute) exacerbation: Secondary | ICD-10-CM | POA: Diagnosis not present

## 2023-02-25 DIAGNOSIS — I5031 Acute diastolic (congestive) heart failure: Secondary | ICD-10-CM | POA: Diagnosis not present

## 2023-02-25 DIAGNOSIS — I10 Essential (primary) hypertension: Secondary | ICD-10-CM | POA: Diagnosis not present

## 2023-02-25 DIAGNOSIS — J9601 Acute respiratory failure with hypoxia: Secondary | ICD-10-CM | POA: Diagnosis not present

## 2023-02-25 DIAGNOSIS — M1009 Idiopathic gout, multiple sites: Secondary | ICD-10-CM | POA: Diagnosis not present

## 2023-02-25 DIAGNOSIS — E11610 Type 2 diabetes mellitus with diabetic neuropathic arthropathy: Secondary | ICD-10-CM | POA: Diagnosis not present

## 2023-03-02 DIAGNOSIS — M1009 Idiopathic gout, multiple sites: Secondary | ICD-10-CM | POA: Diagnosis not present

## 2023-03-02 DIAGNOSIS — I10 Essential (primary) hypertension: Secondary | ICD-10-CM | POA: Diagnosis not present

## 2023-03-02 DIAGNOSIS — J441 Chronic obstructive pulmonary disease with (acute) exacerbation: Secondary | ICD-10-CM | POA: Diagnosis not present

## 2023-03-02 DIAGNOSIS — E11610 Type 2 diabetes mellitus with diabetic neuropathic arthropathy: Secondary | ICD-10-CM | POA: Diagnosis not present

## 2023-03-02 DIAGNOSIS — I5031 Acute diastolic (congestive) heart failure: Secondary | ICD-10-CM | POA: Diagnosis not present

## 2023-03-02 DIAGNOSIS — J9601 Acute respiratory failure with hypoxia: Secondary | ICD-10-CM | POA: Diagnosis not present

## 2023-03-03 DIAGNOSIS — M1009 Idiopathic gout, multiple sites: Secondary | ICD-10-CM | POA: Diagnosis not present

## 2023-03-03 DIAGNOSIS — J441 Chronic obstructive pulmonary disease with (acute) exacerbation: Secondary | ICD-10-CM | POA: Diagnosis not present

## 2023-03-03 DIAGNOSIS — E11610 Type 2 diabetes mellitus with diabetic neuropathic arthropathy: Secondary | ICD-10-CM | POA: Diagnosis not present

## 2023-03-03 DIAGNOSIS — I10 Essential (primary) hypertension: Secondary | ICD-10-CM | POA: Diagnosis not present

## 2023-03-03 DIAGNOSIS — I5031 Acute diastolic (congestive) heart failure: Secondary | ICD-10-CM | POA: Diagnosis not present

## 2023-03-03 DIAGNOSIS — J9601 Acute respiratory failure with hypoxia: Secondary | ICD-10-CM | POA: Diagnosis not present

## 2023-03-04 DIAGNOSIS — I10 Essential (primary) hypertension: Secondary | ICD-10-CM | POA: Diagnosis not present

## 2023-03-04 DIAGNOSIS — J9601 Acute respiratory failure with hypoxia: Secondary | ICD-10-CM | POA: Diagnosis not present

## 2023-03-04 DIAGNOSIS — M1009 Idiopathic gout, multiple sites: Secondary | ICD-10-CM | POA: Diagnosis not present

## 2023-03-04 DIAGNOSIS — E11610 Type 2 diabetes mellitus with diabetic neuropathic arthropathy: Secondary | ICD-10-CM | POA: Diagnosis not present

## 2023-03-04 DIAGNOSIS — I5031 Acute diastolic (congestive) heart failure: Secondary | ICD-10-CM | POA: Diagnosis not present

## 2023-03-04 DIAGNOSIS — J441 Chronic obstructive pulmonary disease with (acute) exacerbation: Secondary | ICD-10-CM | POA: Diagnosis not present

## 2023-03-10 DIAGNOSIS — I5031 Acute diastolic (congestive) heart failure: Secondary | ICD-10-CM | POA: Diagnosis not present

## 2023-03-10 DIAGNOSIS — J441 Chronic obstructive pulmonary disease with (acute) exacerbation: Secondary | ICD-10-CM | POA: Diagnosis not present

## 2023-03-10 DIAGNOSIS — M1009 Idiopathic gout, multiple sites: Secondary | ICD-10-CM | POA: Diagnosis not present

## 2023-03-10 DIAGNOSIS — J9601 Acute respiratory failure with hypoxia: Secondary | ICD-10-CM | POA: Diagnosis not present

## 2023-03-10 DIAGNOSIS — I10 Essential (primary) hypertension: Secondary | ICD-10-CM | POA: Diagnosis not present

## 2023-03-10 DIAGNOSIS — E11610 Type 2 diabetes mellitus with diabetic neuropathic arthropathy: Secondary | ICD-10-CM | POA: Diagnosis not present

## 2023-03-12 DIAGNOSIS — J9601 Acute respiratory failure with hypoxia: Secondary | ICD-10-CM | POA: Diagnosis not present

## 2023-03-12 DIAGNOSIS — E11610 Type 2 diabetes mellitus with diabetic neuropathic arthropathy: Secondary | ICD-10-CM | POA: Diagnosis not present

## 2023-03-12 DIAGNOSIS — I10 Essential (primary) hypertension: Secondary | ICD-10-CM | POA: Diagnosis not present

## 2023-03-12 DIAGNOSIS — I5031 Acute diastolic (congestive) heart failure: Secondary | ICD-10-CM | POA: Diagnosis not present

## 2023-03-12 DIAGNOSIS — J441 Chronic obstructive pulmonary disease with (acute) exacerbation: Secondary | ICD-10-CM | POA: Diagnosis not present

## 2023-03-12 DIAGNOSIS — M1009 Idiopathic gout, multiple sites: Secondary | ICD-10-CM | POA: Diagnosis not present

## 2023-03-14 DIAGNOSIS — I5031 Acute diastolic (congestive) heart failure: Secondary | ICD-10-CM | POA: Diagnosis not present

## 2023-03-14 DIAGNOSIS — J9601 Acute respiratory failure with hypoxia: Secondary | ICD-10-CM | POA: Diagnosis not present

## 2023-03-14 DIAGNOSIS — M1009 Idiopathic gout, multiple sites: Secondary | ICD-10-CM | POA: Diagnosis not present

## 2023-03-14 DIAGNOSIS — I10 Essential (primary) hypertension: Secondary | ICD-10-CM | POA: Diagnosis not present

## 2023-03-14 DIAGNOSIS — J441 Chronic obstructive pulmonary disease with (acute) exacerbation: Secondary | ICD-10-CM | POA: Diagnosis not present

## 2023-03-14 DIAGNOSIS — E11610 Type 2 diabetes mellitus with diabetic neuropathic arthropathy: Secondary | ICD-10-CM | POA: Diagnosis not present

## 2023-03-15 DIAGNOSIS — J9601 Acute respiratory failure with hypoxia: Secondary | ICD-10-CM | POA: Diagnosis not present

## 2023-03-15 DIAGNOSIS — E11610 Type 2 diabetes mellitus with diabetic neuropathic arthropathy: Secondary | ICD-10-CM | POA: Diagnosis not present

## 2023-03-15 DIAGNOSIS — I5031 Acute diastolic (congestive) heart failure: Secondary | ICD-10-CM | POA: Diagnosis not present

## 2023-03-15 DIAGNOSIS — M1009 Idiopathic gout, multiple sites: Secondary | ICD-10-CM | POA: Diagnosis not present

## 2023-03-15 DIAGNOSIS — J441 Chronic obstructive pulmonary disease with (acute) exacerbation: Secondary | ICD-10-CM | POA: Diagnosis not present

## 2023-03-15 DIAGNOSIS — I10 Essential (primary) hypertension: Secondary | ICD-10-CM | POA: Diagnosis not present

## 2023-03-17 DIAGNOSIS — I5031 Acute diastolic (congestive) heart failure: Secondary | ICD-10-CM | POA: Diagnosis not present

## 2023-03-17 DIAGNOSIS — M1009 Idiopathic gout, multiple sites: Secondary | ICD-10-CM | POA: Diagnosis not present

## 2023-03-17 DIAGNOSIS — E11610 Type 2 diabetes mellitus with diabetic neuropathic arthropathy: Secondary | ICD-10-CM | POA: Diagnosis not present

## 2023-03-17 DIAGNOSIS — J9601 Acute respiratory failure with hypoxia: Secondary | ICD-10-CM | POA: Diagnosis not present

## 2023-03-17 DIAGNOSIS — J441 Chronic obstructive pulmonary disease with (acute) exacerbation: Secondary | ICD-10-CM | POA: Diagnosis not present

## 2023-03-17 DIAGNOSIS — I10 Essential (primary) hypertension: Secondary | ICD-10-CM | POA: Diagnosis not present

## 2023-03-18 ENCOUNTER — Other Ambulatory Visit: Payer: Self-pay | Admitting: Internal Medicine

## 2023-03-21 DIAGNOSIS — E11610 Type 2 diabetes mellitus with diabetic neuropathic arthropathy: Secondary | ICD-10-CM | POA: Diagnosis not present

## 2023-03-21 DIAGNOSIS — I5031 Acute diastolic (congestive) heart failure: Secondary | ICD-10-CM | POA: Diagnosis not present

## 2023-03-21 DIAGNOSIS — J9601 Acute respiratory failure with hypoxia: Secondary | ICD-10-CM | POA: Diagnosis not present

## 2023-03-21 DIAGNOSIS — M1009 Idiopathic gout, multiple sites: Secondary | ICD-10-CM | POA: Diagnosis not present

## 2023-03-21 DIAGNOSIS — I10 Essential (primary) hypertension: Secondary | ICD-10-CM | POA: Diagnosis not present

## 2023-03-21 DIAGNOSIS — J441 Chronic obstructive pulmonary disease with (acute) exacerbation: Secondary | ICD-10-CM | POA: Diagnosis not present

## 2023-03-23 DIAGNOSIS — E11610 Type 2 diabetes mellitus with diabetic neuropathic arthropathy: Secondary | ICD-10-CM | POA: Diagnosis not present

## 2023-03-23 DIAGNOSIS — J189 Pneumonia, unspecified organism: Secondary | ICD-10-CM | POA: Diagnosis not present

## 2023-03-23 DIAGNOSIS — M1009 Idiopathic gout, multiple sites: Secondary | ICD-10-CM | POA: Diagnosis not present

## 2023-03-23 DIAGNOSIS — I5031 Acute diastolic (congestive) heart failure: Secondary | ICD-10-CM | POA: Diagnosis not present

## 2023-03-23 DIAGNOSIS — J9601 Acute respiratory failure with hypoxia: Secondary | ICD-10-CM | POA: Diagnosis not present

## 2023-03-23 DIAGNOSIS — N1831 Chronic kidney disease, stage 3a: Secondary | ICD-10-CM | POA: Diagnosis not present

## 2023-03-23 DIAGNOSIS — I10 Essential (primary) hypertension: Secondary | ICD-10-CM | POA: Diagnosis not present

## 2023-03-23 DIAGNOSIS — J441 Chronic obstructive pulmonary disease with (acute) exacerbation: Secondary | ICD-10-CM | POA: Diagnosis not present

## 2023-03-24 DIAGNOSIS — I5031 Acute diastolic (congestive) heart failure: Secondary | ICD-10-CM | POA: Diagnosis not present

## 2023-03-24 DIAGNOSIS — I10 Essential (primary) hypertension: Secondary | ICD-10-CM | POA: Diagnosis not present

## 2023-03-24 DIAGNOSIS — J441 Chronic obstructive pulmonary disease with (acute) exacerbation: Secondary | ICD-10-CM | POA: Diagnosis not present

## 2023-03-24 DIAGNOSIS — J9601 Acute respiratory failure with hypoxia: Secondary | ICD-10-CM | POA: Diagnosis not present

## 2023-03-24 DIAGNOSIS — E11610 Type 2 diabetes mellitus with diabetic neuropathic arthropathy: Secondary | ICD-10-CM | POA: Diagnosis not present

## 2023-03-24 DIAGNOSIS — M1009 Idiopathic gout, multiple sites: Secondary | ICD-10-CM | POA: Diagnosis not present

## 2023-03-26 DIAGNOSIS — M1009 Idiopathic gout, multiple sites: Secondary | ICD-10-CM | POA: Diagnosis not present

## 2023-03-26 DIAGNOSIS — I10 Essential (primary) hypertension: Secondary | ICD-10-CM | POA: Diagnosis not present

## 2023-03-26 DIAGNOSIS — E11610 Type 2 diabetes mellitus with diabetic neuropathic arthropathy: Secondary | ICD-10-CM | POA: Diagnosis not present

## 2023-03-26 DIAGNOSIS — I5031 Acute diastolic (congestive) heart failure: Secondary | ICD-10-CM | POA: Diagnosis not present

## 2023-03-26 DIAGNOSIS — J441 Chronic obstructive pulmonary disease with (acute) exacerbation: Secondary | ICD-10-CM | POA: Diagnosis not present

## 2023-03-26 DIAGNOSIS — J9601 Acute respiratory failure with hypoxia: Secondary | ICD-10-CM | POA: Diagnosis not present

## 2023-03-27 DIAGNOSIS — M1009 Idiopathic gout, multiple sites: Secondary | ICD-10-CM | POA: Diagnosis not present

## 2023-03-27 DIAGNOSIS — J9601 Acute respiratory failure with hypoxia: Secondary | ICD-10-CM | POA: Diagnosis not present

## 2023-03-27 DIAGNOSIS — E11610 Type 2 diabetes mellitus with diabetic neuropathic arthropathy: Secondary | ICD-10-CM | POA: Diagnosis not present

## 2023-03-27 DIAGNOSIS — J441 Chronic obstructive pulmonary disease with (acute) exacerbation: Secondary | ICD-10-CM | POA: Diagnosis not present

## 2023-03-27 DIAGNOSIS — I5031 Acute diastolic (congestive) heart failure: Secondary | ICD-10-CM | POA: Diagnosis not present

## 2023-03-27 DIAGNOSIS — I10 Essential (primary) hypertension: Secondary | ICD-10-CM | POA: Diagnosis not present

## 2023-03-31 DIAGNOSIS — I10 Essential (primary) hypertension: Secondary | ICD-10-CM | POA: Diagnosis not present

## 2023-03-31 DIAGNOSIS — M1009 Idiopathic gout, multiple sites: Secondary | ICD-10-CM | POA: Diagnosis not present

## 2023-03-31 DIAGNOSIS — J9601 Acute respiratory failure with hypoxia: Secondary | ICD-10-CM | POA: Diagnosis not present

## 2023-03-31 DIAGNOSIS — E11610 Type 2 diabetes mellitus with diabetic neuropathic arthropathy: Secondary | ICD-10-CM | POA: Diagnosis not present

## 2023-03-31 DIAGNOSIS — J441 Chronic obstructive pulmonary disease with (acute) exacerbation: Secondary | ICD-10-CM | POA: Diagnosis not present

## 2023-03-31 DIAGNOSIS — I5031 Acute diastolic (congestive) heart failure: Secondary | ICD-10-CM | POA: Diagnosis not present

## 2023-04-07 DIAGNOSIS — E11610 Type 2 diabetes mellitus with diabetic neuropathic arthropathy: Secondary | ICD-10-CM | POA: Diagnosis not present

## 2023-04-07 DIAGNOSIS — M1009 Idiopathic gout, multiple sites: Secondary | ICD-10-CM | POA: Diagnosis not present

## 2023-04-07 DIAGNOSIS — I10 Essential (primary) hypertension: Secondary | ICD-10-CM | POA: Diagnosis not present

## 2023-04-07 DIAGNOSIS — J9601 Acute respiratory failure with hypoxia: Secondary | ICD-10-CM | POA: Diagnosis not present

## 2023-04-07 DIAGNOSIS — I5031 Acute diastolic (congestive) heart failure: Secondary | ICD-10-CM | POA: Diagnosis not present

## 2023-04-07 DIAGNOSIS — J441 Chronic obstructive pulmonary disease with (acute) exacerbation: Secondary | ICD-10-CM | POA: Diagnosis not present

## 2023-04-10 ENCOUNTER — Other Ambulatory Visit: Payer: Self-pay | Admitting: Internal Medicine

## 2023-04-11 DIAGNOSIS — E11610 Type 2 diabetes mellitus with diabetic neuropathic arthropathy: Secondary | ICD-10-CM | POA: Diagnosis not present

## 2023-04-11 DIAGNOSIS — I10 Essential (primary) hypertension: Secondary | ICD-10-CM | POA: Diagnosis not present

## 2023-04-11 DIAGNOSIS — J441 Chronic obstructive pulmonary disease with (acute) exacerbation: Secondary | ICD-10-CM | POA: Diagnosis not present

## 2023-04-11 DIAGNOSIS — M1009 Idiopathic gout, multiple sites: Secondary | ICD-10-CM | POA: Diagnosis not present

## 2023-04-11 DIAGNOSIS — J9601 Acute respiratory failure with hypoxia: Secondary | ICD-10-CM | POA: Diagnosis not present

## 2023-04-11 DIAGNOSIS — I5031 Acute diastolic (congestive) heart failure: Secondary | ICD-10-CM | POA: Diagnosis not present

## 2023-04-11 NOTE — Telephone Encounter (Signed)
Nebulizer meds refilled

## 2023-04-12 DIAGNOSIS — J441 Chronic obstructive pulmonary disease with (acute) exacerbation: Secondary | ICD-10-CM | POA: Diagnosis not present

## 2023-04-12 DIAGNOSIS — J9601 Acute respiratory failure with hypoxia: Secondary | ICD-10-CM | POA: Diagnosis not present

## 2023-04-12 DIAGNOSIS — M1009 Idiopathic gout, multiple sites: Secondary | ICD-10-CM | POA: Diagnosis not present

## 2023-04-12 DIAGNOSIS — I10 Essential (primary) hypertension: Secondary | ICD-10-CM | POA: Diagnosis not present

## 2023-04-12 DIAGNOSIS — E11610 Type 2 diabetes mellitus with diabetic neuropathic arthropathy: Secondary | ICD-10-CM | POA: Diagnosis not present

## 2023-04-12 DIAGNOSIS — I5031 Acute diastolic (congestive) heart failure: Secondary | ICD-10-CM | POA: Diagnosis not present

## 2023-04-14 DIAGNOSIS — I10 Essential (primary) hypertension: Secondary | ICD-10-CM | POA: Diagnosis not present

## 2023-04-14 DIAGNOSIS — E11610 Type 2 diabetes mellitus with diabetic neuropathic arthropathy: Secondary | ICD-10-CM | POA: Diagnosis not present

## 2023-04-14 DIAGNOSIS — J9601 Acute respiratory failure with hypoxia: Secondary | ICD-10-CM | POA: Diagnosis not present

## 2023-04-14 DIAGNOSIS — I5031 Acute diastolic (congestive) heart failure: Secondary | ICD-10-CM | POA: Diagnosis not present

## 2023-04-14 DIAGNOSIS — M1009 Idiopathic gout, multiple sites: Secondary | ICD-10-CM | POA: Diagnosis not present

## 2023-04-14 DIAGNOSIS — J441 Chronic obstructive pulmonary disease with (acute) exacerbation: Secondary | ICD-10-CM | POA: Diagnosis not present

## 2023-04-20 DIAGNOSIS — I5031 Acute diastolic (congestive) heart failure: Secondary | ICD-10-CM | POA: Diagnosis not present

## 2023-04-20 DIAGNOSIS — I10 Essential (primary) hypertension: Secondary | ICD-10-CM | POA: Diagnosis not present

## 2023-04-20 DIAGNOSIS — M1009 Idiopathic gout, multiple sites: Secondary | ICD-10-CM | POA: Diagnosis not present

## 2023-04-20 DIAGNOSIS — E11610 Type 2 diabetes mellitus with diabetic neuropathic arthropathy: Secondary | ICD-10-CM | POA: Diagnosis not present

## 2023-04-20 DIAGNOSIS — J441 Chronic obstructive pulmonary disease with (acute) exacerbation: Secondary | ICD-10-CM | POA: Diagnosis not present

## 2023-04-20 DIAGNOSIS — J9601 Acute respiratory failure with hypoxia: Secondary | ICD-10-CM | POA: Diagnosis not present

## 2023-04-21 DIAGNOSIS — J441 Chronic obstructive pulmonary disease with (acute) exacerbation: Secondary | ICD-10-CM | POA: Diagnosis not present

## 2023-04-21 DIAGNOSIS — I10 Essential (primary) hypertension: Secondary | ICD-10-CM | POA: Diagnosis not present

## 2023-04-21 DIAGNOSIS — J9601 Acute respiratory failure with hypoxia: Secondary | ICD-10-CM | POA: Diagnosis not present

## 2023-04-21 DIAGNOSIS — M1009 Idiopathic gout, multiple sites: Secondary | ICD-10-CM | POA: Diagnosis not present

## 2023-04-21 DIAGNOSIS — E11610 Type 2 diabetes mellitus with diabetic neuropathic arthropathy: Secondary | ICD-10-CM | POA: Diagnosis not present

## 2023-04-21 DIAGNOSIS — I5031 Acute diastolic (congestive) heart failure: Secondary | ICD-10-CM | POA: Diagnosis not present

## 2023-04-23 DIAGNOSIS — M1009 Idiopathic gout, multiple sites: Secondary | ICD-10-CM | POA: Diagnosis not present

## 2023-04-23 DIAGNOSIS — J189 Pneumonia, unspecified organism: Secondary | ICD-10-CM | POA: Diagnosis not present

## 2023-04-23 DIAGNOSIS — J9601 Acute respiratory failure with hypoxia: Secondary | ICD-10-CM | POA: Diagnosis not present

## 2023-04-23 DIAGNOSIS — I10 Essential (primary) hypertension: Secondary | ICD-10-CM | POA: Diagnosis not present

## 2023-04-23 DIAGNOSIS — E11610 Type 2 diabetes mellitus with diabetic neuropathic arthropathy: Secondary | ICD-10-CM | POA: Diagnosis not present

## 2023-04-23 DIAGNOSIS — I5031 Acute diastolic (congestive) heart failure: Secondary | ICD-10-CM | POA: Diagnosis not present

## 2023-04-23 DIAGNOSIS — N1831 Chronic kidney disease, stage 3a: Secondary | ICD-10-CM | POA: Diagnosis not present

## 2023-04-23 DIAGNOSIS — J441 Chronic obstructive pulmonary disease with (acute) exacerbation: Secondary | ICD-10-CM | POA: Diagnosis not present

## 2023-04-25 DIAGNOSIS — I10 Essential (primary) hypertension: Secondary | ICD-10-CM | POA: Diagnosis not present

## 2023-04-25 DIAGNOSIS — M1009 Idiopathic gout, multiple sites: Secondary | ICD-10-CM | POA: Diagnosis not present

## 2023-04-25 DIAGNOSIS — E11610 Type 2 diabetes mellitus with diabetic neuropathic arthropathy: Secondary | ICD-10-CM | POA: Diagnosis not present

## 2023-04-25 DIAGNOSIS — J441 Chronic obstructive pulmonary disease with (acute) exacerbation: Secondary | ICD-10-CM | POA: Diagnosis not present

## 2023-04-25 DIAGNOSIS — J9601 Acute respiratory failure with hypoxia: Secondary | ICD-10-CM | POA: Diagnosis not present

## 2023-04-25 DIAGNOSIS — I5031 Acute diastolic (congestive) heart failure: Secondary | ICD-10-CM | POA: Diagnosis not present

## 2023-04-28 DIAGNOSIS — I5031 Acute diastolic (congestive) heart failure: Secondary | ICD-10-CM | POA: Diagnosis not present

## 2023-04-28 DIAGNOSIS — J441 Chronic obstructive pulmonary disease with (acute) exacerbation: Secondary | ICD-10-CM | POA: Diagnosis not present

## 2023-04-28 DIAGNOSIS — M1009 Idiopathic gout, multiple sites: Secondary | ICD-10-CM | POA: Diagnosis not present

## 2023-04-28 DIAGNOSIS — J9601 Acute respiratory failure with hypoxia: Secondary | ICD-10-CM | POA: Diagnosis not present

## 2023-04-28 DIAGNOSIS — E11610 Type 2 diabetes mellitus with diabetic neuropathic arthropathy: Secondary | ICD-10-CM | POA: Diagnosis not present

## 2023-04-28 DIAGNOSIS — I10 Essential (primary) hypertension: Secondary | ICD-10-CM | POA: Diagnosis not present

## 2023-05-05 DIAGNOSIS — J9601 Acute respiratory failure with hypoxia: Secondary | ICD-10-CM | POA: Diagnosis not present

## 2023-05-05 DIAGNOSIS — I10 Essential (primary) hypertension: Secondary | ICD-10-CM | POA: Diagnosis not present

## 2023-05-05 DIAGNOSIS — M1009 Idiopathic gout, multiple sites: Secondary | ICD-10-CM | POA: Diagnosis not present

## 2023-05-05 DIAGNOSIS — E11610 Type 2 diabetes mellitus with diabetic neuropathic arthropathy: Secondary | ICD-10-CM | POA: Diagnosis not present

## 2023-05-05 DIAGNOSIS — J441 Chronic obstructive pulmonary disease with (acute) exacerbation: Secondary | ICD-10-CM | POA: Diagnosis not present

## 2023-05-05 DIAGNOSIS — I5031 Acute diastolic (congestive) heart failure: Secondary | ICD-10-CM | POA: Diagnosis not present

## 2023-05-07 DIAGNOSIS — J441 Chronic obstructive pulmonary disease with (acute) exacerbation: Secondary | ICD-10-CM | POA: Diagnosis not present

## 2023-05-07 DIAGNOSIS — I10 Essential (primary) hypertension: Secondary | ICD-10-CM | POA: Diagnosis not present

## 2023-05-07 DIAGNOSIS — I5031 Acute diastolic (congestive) heart failure: Secondary | ICD-10-CM | POA: Diagnosis not present

## 2023-05-07 DIAGNOSIS — E11610 Type 2 diabetes mellitus with diabetic neuropathic arthropathy: Secondary | ICD-10-CM | POA: Diagnosis not present

## 2023-05-07 DIAGNOSIS — J9601 Acute respiratory failure with hypoxia: Secondary | ICD-10-CM | POA: Diagnosis not present

## 2023-05-07 DIAGNOSIS — M1009 Idiopathic gout, multiple sites: Secondary | ICD-10-CM | POA: Diagnosis not present

## 2023-05-08 DIAGNOSIS — J9601 Acute respiratory failure with hypoxia: Secondary | ICD-10-CM | POA: Diagnosis not present

## 2023-05-08 DIAGNOSIS — E11610 Type 2 diabetes mellitus with diabetic neuropathic arthropathy: Secondary | ICD-10-CM | POA: Diagnosis not present

## 2023-05-08 DIAGNOSIS — M1009 Idiopathic gout, multiple sites: Secondary | ICD-10-CM | POA: Diagnosis not present

## 2023-05-08 DIAGNOSIS — J441 Chronic obstructive pulmonary disease with (acute) exacerbation: Secondary | ICD-10-CM | POA: Diagnosis not present

## 2023-05-08 DIAGNOSIS — I5031 Acute diastolic (congestive) heart failure: Secondary | ICD-10-CM | POA: Diagnosis not present

## 2023-05-08 DIAGNOSIS — I10 Essential (primary) hypertension: Secondary | ICD-10-CM | POA: Diagnosis not present

## 2023-05-13 DIAGNOSIS — M1009 Idiopathic gout, multiple sites: Secondary | ICD-10-CM | POA: Diagnosis not present

## 2023-05-13 DIAGNOSIS — J441 Chronic obstructive pulmonary disease with (acute) exacerbation: Secondary | ICD-10-CM | POA: Diagnosis not present

## 2023-05-13 DIAGNOSIS — E11610 Type 2 diabetes mellitus with diabetic neuropathic arthropathy: Secondary | ICD-10-CM | POA: Diagnosis not present

## 2023-05-13 DIAGNOSIS — I5031 Acute diastolic (congestive) heart failure: Secondary | ICD-10-CM | POA: Diagnosis not present

## 2023-05-13 DIAGNOSIS — I10 Essential (primary) hypertension: Secondary | ICD-10-CM | POA: Diagnosis not present

## 2023-05-13 DIAGNOSIS — J9601 Acute respiratory failure with hypoxia: Secondary | ICD-10-CM | POA: Diagnosis not present

## 2023-05-19 DIAGNOSIS — I10 Essential (primary) hypertension: Secondary | ICD-10-CM | POA: Diagnosis not present

## 2023-05-19 DIAGNOSIS — J441 Chronic obstructive pulmonary disease with (acute) exacerbation: Secondary | ICD-10-CM | POA: Diagnosis not present

## 2023-05-19 DIAGNOSIS — E11610 Type 2 diabetes mellitus with diabetic neuropathic arthropathy: Secondary | ICD-10-CM | POA: Diagnosis not present

## 2023-05-19 DIAGNOSIS — J9601 Acute respiratory failure with hypoxia: Secondary | ICD-10-CM | POA: Diagnosis not present

## 2023-05-19 DIAGNOSIS — M1009 Idiopathic gout, multiple sites: Secondary | ICD-10-CM | POA: Diagnosis not present

## 2023-05-19 DIAGNOSIS — I5031 Acute diastolic (congestive) heart failure: Secondary | ICD-10-CM | POA: Diagnosis not present

## 2023-05-23 DIAGNOSIS — J441 Chronic obstructive pulmonary disease with (acute) exacerbation: Secondary | ICD-10-CM | POA: Diagnosis not present

## 2023-05-23 DIAGNOSIS — I10 Essential (primary) hypertension: Secondary | ICD-10-CM | POA: Diagnosis not present

## 2023-05-23 DIAGNOSIS — E11610 Type 2 diabetes mellitus with diabetic neuropathic arthropathy: Secondary | ICD-10-CM | POA: Diagnosis not present

## 2023-05-23 DIAGNOSIS — M1009 Idiopathic gout, multiple sites: Secondary | ICD-10-CM | POA: Diagnosis not present

## 2023-05-23 DIAGNOSIS — I5031 Acute diastolic (congestive) heart failure: Secondary | ICD-10-CM | POA: Diagnosis not present

## 2023-05-23 DIAGNOSIS — J9601 Acute respiratory failure with hypoxia: Secondary | ICD-10-CM | POA: Diagnosis not present

## 2023-05-24 DIAGNOSIS — J9601 Acute respiratory failure with hypoxia: Secondary | ICD-10-CM | POA: Diagnosis not present

## 2023-05-24 DIAGNOSIS — N1831 Chronic kidney disease, stage 3a: Secondary | ICD-10-CM | POA: Diagnosis not present

## 2023-05-24 DIAGNOSIS — I10 Essential (primary) hypertension: Secondary | ICD-10-CM | POA: Diagnosis not present

## 2023-05-24 DIAGNOSIS — J189 Pneumonia, unspecified organism: Secondary | ICD-10-CM | POA: Diagnosis not present

## 2023-05-24 DIAGNOSIS — I5031 Acute diastolic (congestive) heart failure: Secondary | ICD-10-CM | POA: Diagnosis not present

## 2023-05-24 DIAGNOSIS — J441 Chronic obstructive pulmonary disease with (acute) exacerbation: Secondary | ICD-10-CM | POA: Diagnosis not present

## 2023-05-24 DIAGNOSIS — E11610 Type 2 diabetes mellitus with diabetic neuropathic arthropathy: Secondary | ICD-10-CM | POA: Diagnosis not present

## 2023-05-24 DIAGNOSIS — M1009 Idiopathic gout, multiple sites: Secondary | ICD-10-CM | POA: Diagnosis not present

## 2023-05-26 DIAGNOSIS — I5031 Acute diastolic (congestive) heart failure: Secondary | ICD-10-CM | POA: Diagnosis not present

## 2023-05-26 DIAGNOSIS — I10 Essential (primary) hypertension: Secondary | ICD-10-CM | POA: Diagnosis not present

## 2023-05-26 DIAGNOSIS — J441 Chronic obstructive pulmonary disease with (acute) exacerbation: Secondary | ICD-10-CM | POA: Diagnosis not present

## 2023-05-26 DIAGNOSIS — E11610 Type 2 diabetes mellitus with diabetic neuropathic arthropathy: Secondary | ICD-10-CM | POA: Diagnosis not present

## 2023-05-26 DIAGNOSIS — M1009 Idiopathic gout, multiple sites: Secondary | ICD-10-CM | POA: Diagnosis not present

## 2023-05-26 DIAGNOSIS — J9601 Acute respiratory failure with hypoxia: Secondary | ICD-10-CM | POA: Diagnosis not present

## 2023-05-27 DIAGNOSIS — I5031 Acute diastolic (congestive) heart failure: Secondary | ICD-10-CM | POA: Diagnosis not present

## 2023-05-27 DIAGNOSIS — M1009 Idiopathic gout, multiple sites: Secondary | ICD-10-CM | POA: Diagnosis not present

## 2023-05-27 DIAGNOSIS — J9601 Acute respiratory failure with hypoxia: Secondary | ICD-10-CM | POA: Diagnosis not present

## 2023-05-27 DIAGNOSIS — J441 Chronic obstructive pulmonary disease with (acute) exacerbation: Secondary | ICD-10-CM | POA: Diagnosis not present

## 2023-05-27 DIAGNOSIS — I10 Essential (primary) hypertension: Secondary | ICD-10-CM | POA: Diagnosis not present

## 2023-05-27 DIAGNOSIS — E11610 Type 2 diabetes mellitus with diabetic neuropathic arthropathy: Secondary | ICD-10-CM | POA: Diagnosis not present

## 2023-05-28 DIAGNOSIS — J441 Chronic obstructive pulmonary disease with (acute) exacerbation: Secondary | ICD-10-CM | POA: Diagnosis not present

## 2023-05-28 DIAGNOSIS — I10 Essential (primary) hypertension: Secondary | ICD-10-CM | POA: Diagnosis not present

## 2023-05-28 DIAGNOSIS — E11610 Type 2 diabetes mellitus with diabetic neuropathic arthropathy: Secondary | ICD-10-CM | POA: Diagnosis not present

## 2023-05-28 DIAGNOSIS — I5031 Acute diastolic (congestive) heart failure: Secondary | ICD-10-CM | POA: Diagnosis not present

## 2023-05-28 DIAGNOSIS — M1009 Idiopathic gout, multiple sites: Secondary | ICD-10-CM | POA: Diagnosis not present

## 2023-05-28 DIAGNOSIS — J9601 Acute respiratory failure with hypoxia: Secondary | ICD-10-CM | POA: Diagnosis not present

## 2023-05-30 DIAGNOSIS — E11610 Type 2 diabetes mellitus with diabetic neuropathic arthropathy: Secondary | ICD-10-CM | POA: Diagnosis not present

## 2023-05-30 DIAGNOSIS — J441 Chronic obstructive pulmonary disease with (acute) exacerbation: Secondary | ICD-10-CM | POA: Diagnosis not present

## 2023-05-30 DIAGNOSIS — J9601 Acute respiratory failure with hypoxia: Secondary | ICD-10-CM | POA: Diagnosis not present

## 2023-05-30 DIAGNOSIS — I10 Essential (primary) hypertension: Secondary | ICD-10-CM | POA: Diagnosis not present

## 2023-05-30 DIAGNOSIS — M1009 Idiopathic gout, multiple sites: Secondary | ICD-10-CM | POA: Diagnosis not present

## 2023-05-30 DIAGNOSIS — I5031 Acute diastolic (congestive) heart failure: Secondary | ICD-10-CM | POA: Diagnosis not present

## 2023-06-01 DIAGNOSIS — E11610 Type 2 diabetes mellitus with diabetic neuropathic arthropathy: Secondary | ICD-10-CM | POA: Diagnosis not present

## 2023-06-01 DIAGNOSIS — J9601 Acute respiratory failure with hypoxia: Secondary | ICD-10-CM | POA: Diagnosis not present

## 2023-06-01 DIAGNOSIS — I5031 Acute diastolic (congestive) heart failure: Secondary | ICD-10-CM | POA: Diagnosis not present

## 2023-06-01 DIAGNOSIS — I10 Essential (primary) hypertension: Secondary | ICD-10-CM | POA: Diagnosis not present

## 2023-06-01 DIAGNOSIS — J441 Chronic obstructive pulmonary disease with (acute) exacerbation: Secondary | ICD-10-CM | POA: Diagnosis not present

## 2023-06-01 DIAGNOSIS — M1009 Idiopathic gout, multiple sites: Secondary | ICD-10-CM | POA: Diagnosis not present

## 2023-06-02 DIAGNOSIS — I5031 Acute diastolic (congestive) heart failure: Secondary | ICD-10-CM | POA: Diagnosis not present

## 2023-06-02 DIAGNOSIS — J441 Chronic obstructive pulmonary disease with (acute) exacerbation: Secondary | ICD-10-CM | POA: Diagnosis not present

## 2023-06-02 DIAGNOSIS — E11610 Type 2 diabetes mellitus with diabetic neuropathic arthropathy: Secondary | ICD-10-CM | POA: Diagnosis not present

## 2023-06-02 DIAGNOSIS — I10 Essential (primary) hypertension: Secondary | ICD-10-CM | POA: Diagnosis not present

## 2023-06-02 DIAGNOSIS — J9601 Acute respiratory failure with hypoxia: Secondary | ICD-10-CM | POA: Diagnosis not present

## 2023-06-02 DIAGNOSIS — M1009 Idiopathic gout, multiple sites: Secondary | ICD-10-CM | POA: Diagnosis not present

## 2023-06-03 DIAGNOSIS — J441 Chronic obstructive pulmonary disease with (acute) exacerbation: Secondary | ICD-10-CM | POA: Diagnosis not present

## 2023-06-03 DIAGNOSIS — E11610 Type 2 diabetes mellitus with diabetic neuropathic arthropathy: Secondary | ICD-10-CM | POA: Diagnosis not present

## 2023-06-03 DIAGNOSIS — I10 Essential (primary) hypertension: Secondary | ICD-10-CM | POA: Diagnosis not present

## 2023-06-03 DIAGNOSIS — I5031 Acute diastolic (congestive) heart failure: Secondary | ICD-10-CM | POA: Diagnosis not present

## 2023-06-03 DIAGNOSIS — J9601 Acute respiratory failure with hypoxia: Secondary | ICD-10-CM | POA: Diagnosis not present

## 2023-06-03 DIAGNOSIS — M1009 Idiopathic gout, multiple sites: Secondary | ICD-10-CM | POA: Diagnosis not present

## 2023-06-04 DIAGNOSIS — I10 Essential (primary) hypertension: Secondary | ICD-10-CM | POA: Diagnosis not present

## 2023-06-04 DIAGNOSIS — I5031 Acute diastolic (congestive) heart failure: Secondary | ICD-10-CM | POA: Diagnosis not present

## 2023-06-04 DIAGNOSIS — J9601 Acute respiratory failure with hypoxia: Secondary | ICD-10-CM | POA: Diagnosis not present

## 2023-06-04 DIAGNOSIS — M1009 Idiopathic gout, multiple sites: Secondary | ICD-10-CM | POA: Diagnosis not present

## 2023-06-04 DIAGNOSIS — E11610 Type 2 diabetes mellitus with diabetic neuropathic arthropathy: Secondary | ICD-10-CM | POA: Diagnosis not present

## 2023-06-04 DIAGNOSIS — J441 Chronic obstructive pulmonary disease with (acute) exacerbation: Secondary | ICD-10-CM | POA: Diagnosis not present

## 2023-06-21 DEATH — deceased

## 2023-12-08 NOTE — Progress Notes (Deleted)
 HPI F never smoker, followed for bronchiectasis/ chronic bronchitis/ MAIC, complicated by hx of GERD, DM , arthritis, aortic atherosclerosis Sputum Cx 06/09/12 POS MAIC  Started 07/14/12- Biaxin  500mg  TIW, EMB 1200 TIW, Rifabutin  300 TIW  ended- 08/12/12 due to weakness and malaise,  CT chest 06/01/12- Progressive bilateral nodular bronchiectasis Office spirometry  01/18/13- moderate obstructive airways disease-FVC 2.10/69%, FEV1 1.25/58%,  FEV1/FVC  0.59/ 84%,  FEF 25-75% 0.47/33% --------------------------------------------------------------------------------    12/10/22- 89 year old female never smoker followed for Bronchiectasis/chronic bronchitis/MAIC, complicated by history GERD, DM2, arthritis,hyponatremia, aortic atherosclerosis, Lumbar DDD/ back pain, HTN, dCHF, Macular Degenration,  -Ventolin  hfa, neb Perforomist / Pulmicort / Atrovent  0.02%, tessalon , tramadol  Hosp I November with CAP. O2 2L/ [employer] 24/7 Visit required by DME to renew certification for home O2 Arrival desat at rest was 89% on room air, then 96% on O2 3L She is frail and feels poorly. Several people look in on shifts to help her at home. Constant coughing, mostly dry or clear phelgm. Weak. No fever. Very poor eyesight. Has been out of tessalon  perles and trying to stretch cough syrup. CXR 02/20/22- IMPRESSION: 1. New superimposed airspace disease within the left lower lobe concerning for pneumonia. 2. Chronic reticular and nodular interstitial opacities scattered throughout both lungs compatible with chronic lung disease.  12/11/23- 89 year old female never smoker followed for Bronchiectasis/chronic bronchitis/MAIC, complicated by history GERD, DM2, arthritis,hyponatremia, aortic atherosclerosis, Lumbar DDD/ back pain, HTN, dCHF, Macular Degenration,  -Ventolin  hfa, neb Perforomist / Pulmicort / Atrovent  0.02%, tessalon , tramadol  Hosp I November with CAP. O2 2L/ [employer] 24/7 Hosp Sept, 2024- weakness, dCHF   CXR  01/07/23 MPRESSION: Stable reticulonodular densities are noted throughout both lungs, most prominently seen in left lower lobe. This is concerning for chronic scarring or possibly atypical inflammation. Aortic Atherosclerosis (ICD10-I70.0).     ROS-see HPI + = positive Constitutional:     +weight loss, night sweats, no-fevers, chills,  +fatigue, lassitude. HEENT:   No-  headaches, difficulty swallowing, +tooth/dental problems, no-sore throat,       No-  sneezing, itching, ear ache, +nasal congestion, post nasal drip, + declining eyesight CV:  + chest pain, orthopnea, PND, swelling in lower extremities, anasarca,  dizziness, palpitations Resp: +Mild  shortness of breath with exertion or at rest.             +productive cough,  + non-productive cough,  No- coughing up of blood.              No-   change in color of mucus.  No- wheezing.   Skin: No-   rash or lesions. GI:  No-   heartburn, indigestion, abdominal pain, nausea, vomiting,  GU:  MS:  +Active low back and bilateral hip pain. L rib and L shoulder pain Neuro-     complaint of generalized weakness without myalgias Psych:  No- change in mood or affect. No depression or anxiety.  No memory loss.  Obj- Physical exam General- Alert, Oriented, Frail, NAD, gracious woman, trying to be stoic,  Skin- rash-none,  excoriation- none Lymphadenopathy- none Head- atraumatic            Eyes- + decreased vision            Ears- Hearing, canals-normal            Nose- Clear, no-Septal dev, mucus, polyps, erosion, perforation             Throat- Mallampati II , mucosa clear , drainage- none, tonsils- atrophic Neck- flexible , trachea midline, no stridor ,  thyroid  nl, carotid no bruit Chest - symmetrical excursion , unlabored           Heart/CV- RRR , no murmur , no gallop  , no rub, nl s1 s2                           - JVD- none , edema+1, stasis changes- none, varices- none           Lung-   Cough+ intermittent, + scattered rhonchi, no  wheeze,  dullness-none, rub-none. + no crepitus Chest wall-  Abd-  Br/ Gen/ Rectal- Not done, not indicated Extrem-   No-clubbing, none, atrophy- none, strength- nl for age. + Cool/dusky feet Neuro- grossly intact to observation

## 2023-12-11 ENCOUNTER — Ambulatory Visit: Payer: Medicare Other | Admitting: Internal Medicine
